# Patient Record
Sex: Female | Born: 1955
Health system: Southern US, Community
[De-identification: ages and names within clinical notes are randomized; demographics above are authoritative.]

## PROBLEM LIST (undated history)

## (undated) DIAGNOSIS — R252 Cramp and spasm: Secondary | ICD-10-CM

## (undated) DIAGNOSIS — K219 Gastro-esophageal reflux disease without esophagitis: Secondary | ICD-10-CM

## (undated) DIAGNOSIS — N83209 Unspecified ovarian cyst, unspecified side: Secondary | ICD-10-CM

## (undated) DIAGNOSIS — N189 Chronic kidney disease, unspecified: Secondary | ICD-10-CM

## (undated) DIAGNOSIS — J040 Acute laryngitis: Secondary | ICD-10-CM

## (undated) DIAGNOSIS — E039 Hypothyroidism, unspecified: Secondary | ICD-10-CM

## (undated) DIAGNOSIS — E1143 Type 2 diabetes mellitus with diabetic autonomic (poly)neuropathy: Secondary | ICD-10-CM

## (undated) DIAGNOSIS — R32 Unspecified urinary incontinence: Secondary | ICD-10-CM

## (undated) DIAGNOSIS — E782 Mixed hyperlipidemia: Secondary | ICD-10-CM

## (undated) DIAGNOSIS — L719 Rosacea, unspecified: Secondary | ICD-10-CM

## (undated) DIAGNOSIS — I10 Essential (primary) hypertension: Secondary | ICD-10-CM

## (undated) DIAGNOSIS — D131 Benign neoplasm of stomach: Secondary | ICD-10-CM

## (undated) DIAGNOSIS — R740 Nonspecific elevation of levels of transaminase and lactic acid dehydrogenase [LDH]: Secondary | ICD-10-CM

## (undated) DIAGNOSIS — T7840XA Allergy, unspecified, initial encounter: Secondary | ICD-10-CM

## (undated) DIAGNOSIS — I4891 Unspecified atrial fibrillation: Secondary | ICD-10-CM

## (undated) DIAGNOSIS — G894 Chronic pain syndrome: Secondary | ICD-10-CM

## (undated) DIAGNOSIS — H269 Unspecified cataract: Secondary | ICD-10-CM

## (undated) DIAGNOSIS — J45909 Unspecified asthma, uncomplicated: Secondary | ICD-10-CM

## (undated) DIAGNOSIS — M1990 Unspecified osteoarthritis, unspecified site: Secondary | ICD-10-CM

## (undated) DIAGNOSIS — D509 Iron deficiency anemia, unspecified: Secondary | ICD-10-CM

## (undated) DIAGNOSIS — M722 Plantar fascial fibromatosis: Secondary | ICD-10-CM

## (undated) DIAGNOSIS — K3184 Gastroparesis: Secondary | ICD-10-CM

## (undated) DIAGNOSIS — J189 Pneumonia, unspecified organism: Secondary | ICD-10-CM

## (undated) DIAGNOSIS — I499 Cardiac arrhythmia, unspecified: Secondary | ICD-10-CM

## (undated) DIAGNOSIS — E119 Type 2 diabetes mellitus without complications: Secondary | ICD-10-CM

## (undated) DIAGNOSIS — G5600 Carpal tunnel syndrome, unspecified upper limb: Secondary | ICD-10-CM

## (undated) DIAGNOSIS — K909 Intestinal malabsorption, unspecified: Secondary | ICD-10-CM

## (undated) DIAGNOSIS — K7689 Other specified diseases of liver: Secondary | ICD-10-CM

## (undated) DIAGNOSIS — R110 Nausea: Secondary | ICD-10-CM

## (undated) DIAGNOSIS — E876 Hypokalemia: Secondary | ICD-10-CM

## (undated) HISTORY — DX: Unspecified urinary incontinence: R32

## (undated) HISTORY — DX: Type 2 diabetes mellitus with diabetic autonomic (poly)neuropathy: E11.43

## (undated) HISTORY — DX: Acute laryngitis: J04.0

## (undated) HISTORY — DX: Intestinal malabsorption, unspecified: K90.9

## (undated) HISTORY — DX: Hypokalemia: E87.6

## (undated) HISTORY — DX: Chronic pain syndrome: G89.4

## (undated) HISTORY — DX: Chronic kidney disease, unspecified: N18.9

## (undated) HISTORY — DX: Nausea: R11.0

## (undated) HISTORY — PX: COLONOSCOPY: SHX174

## (undated) HISTORY — PX: CATARACT EXTRACTION W/ INTRAOCULAR LENS IMPLANT: SHX1309

## (undated) HISTORY — DX: Hypomagnesemia: E83.42

## (undated) HISTORY — DX: Benign neoplasm of stomach: D13.1

## (undated) HISTORY — DX: Other specified diseases of liver: K76.89

## (undated) HISTORY — DX: Hypothyroidism, unspecified: E03.9

## (undated) HISTORY — DX: Unspecified ovarian cyst, unspecified side: N83.209

## (undated) HISTORY — DX: Mixed hyperlipidemia: E78.2

## (undated) HISTORY — DX: Unspecified atrial fibrillation: I48.91

## (undated) HISTORY — DX: Carpal tunnel syndrome, unspecified upper limb: G56.00

## (undated) HISTORY — DX: Nonspecific elevation of levels of transaminase and lactic acid dehydrogenase (ldh): R74.0

## (undated) HISTORY — DX: Type 2 diabetes mellitus without complications: E11.9

## (undated) HISTORY — PX: ESOPHAGOGASTRODUODENOSCOPY: SHX1529

## (undated) HISTORY — DX: Essential (primary) hypertension: I10

## (undated) HISTORY — DX: Gastro-esophageal reflux disease without esophagitis: K21.9

## (undated) HISTORY — DX: Unspecified cataract: H26.9

## (undated) HISTORY — DX: Allergy, unspecified, initial encounter: T78.40XA

## (undated) HISTORY — DX: Gastroparesis: K31.84

## (undated) HISTORY — DX: Unspecified osteoarthritis, unspecified site: M19.90

## (undated) HISTORY — DX: Iron deficiency anemia, unspecified: D50.9

## (undated) HISTORY — DX: Rosacea, unspecified: L71.9

## (undated) HISTORY — DX: Cramp and spasm: R25.2

## (undated) HISTORY — PX: KNEE ARTHROSCOPY: SUR90

## (undated) HISTORY — DX: Plantar fascial fibromatosis: M72.2

---
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
## 1979-05-28 HISTORY — PX: BLADDER SUSPENSION: SHX72

## 1981-05-27 HISTORY — PX: VAGINAL HYSTERECTOMY: SUR661

## 2000-05-27 HISTORY — PX: AUGMENTATION MAMMAPLASTY: SUR837

## 2003-05-27 ENCOUNTER — Ambulatory Visit (HOSPITAL_COMMUNITY): Admission: RE | Admit: 2003-05-27 | Discharge: 2003-05-27 | Payer: Self-pay | Admitting: *Deleted

## 2005-05-27 HISTORY — PX: LAPAROSCOPIC CHOLECYSTECTOMY: SUR755

## 2006-04-21 ENCOUNTER — Encounter: Admission: RE | Admit: 2006-04-21 | Discharge: 2006-07-20 | Payer: Self-pay | Admitting: Family Medicine

## 2006-04-22 ENCOUNTER — Emergency Department (HOSPITAL_COMMUNITY): Admission: EM | Admit: 2006-04-22 | Discharge: 2006-04-22 | Payer: Self-pay | Admitting: Emergency Medicine

## 2006-05-02 ENCOUNTER — Encounter (INDEPENDENT_AMBULATORY_CARE_PROVIDER_SITE_OTHER): Payer: Self-pay | Admitting: *Deleted

## 2006-05-02 ENCOUNTER — Ambulatory Visit (HOSPITAL_COMMUNITY): Admission: RE | Admit: 2006-05-02 | Discharge: 2006-05-02 | Payer: Self-pay | Admitting: *Deleted

## 2006-08-29 ENCOUNTER — Ambulatory Visit: Payer: Self-pay | Admitting: Internal Medicine

## 2006-10-08 ENCOUNTER — Ambulatory Visit: Payer: Self-pay | Admitting: Internal Medicine

## 2006-10-08 LAB — CONVERTED CEMR LAB
ALT: 72 units/L — ABNORMAL HIGH (ref 0–40)
AST: 43 units/L — ABNORMAL HIGH (ref 0–37)
Albumin: 4 g/dL (ref 3.5–5.2)
Alkaline Phosphatase: 84 units/L (ref 39–117)
Bilirubin, Direct: 0.1 mg/dL (ref 0.0–0.3)
Cholesterol: 188 mg/dL (ref 0–200)
Ferritin: 175.9 ng/mL (ref 10.0–291.0)
HDL: 37 mg/dL — ABNORMAL LOW (ref >39.0)
LDL Cholesterol: 112 mg/dL — ABNORMAL HIGH (ref 0–99)
Rheumatoid fact SerPl-aCnc: <20 intl units/mL — ABNORMAL LOW (ref 0.0–20.0)
Total Bilirubin: 0.9 mg/dL (ref 0.3–1.2)
Total CHOL/HDL Ratio: 5.1
Total CK: 53 units/L (ref 7–177)
Total Protein: 6.9 g/dL (ref 6.0–8.3)
Triglycerides: 193 mg/dL — ABNORMAL HIGH (ref 0–149)
VLDL: 39 mg/dL (ref 0–40)

## 2006-10-14 ENCOUNTER — Ambulatory Visit: Payer: Self-pay | Admitting: Internal Medicine

## 2006-10-28 ENCOUNTER — Ambulatory Visit: Payer: Self-pay | Admitting: Internal Medicine

## 2006-11-13 ENCOUNTER — Ambulatory Visit: Payer: Self-pay | Admitting: Internal Medicine

## 2006-11-13 LAB — CONVERTED CEMR LAB
ALT: 92 units/L — ABNORMAL HIGH (ref 0–40)
AST: 60 units/L — ABNORMAL HIGH (ref 0–37)
Albumin ELP: 61.3 % (ref 55.8–66.1)
Alpha-1-Globulin: 3.8 % (ref 2.9–4.9)
Alpha-2-Globulin: 9.1 % (ref 7.1–11.8)
Anti Nuclear Antibody(ANA): NEGATIVE
BUN: 10 mg/dL (ref 6–23)
Beta Globulin: 7.6 % — ABNORMAL HIGH (ref 4.7–7.2)
CO2: 30 meq/L (ref 19–32)
Calcium: 9.6 mg/dL (ref 8.4–10.5)
Ceruloplasmin: 37 mg/dL (ref 21–63)
Chloride: 107 meq/L (ref 96–112)
Cholesterol: 212 mg/dL (ref 0–200)
Creatinine, Ser: 0.6 mg/dL (ref 0.4–1.2)
Creatinine,U: 139 mg/dL
Direct LDL: 136.9 mg/dL
GFR calc Af Amer: 135 mL/min
GFR calc non Af Amer: 112 mL/min
Gamma Globulin: 12.5 % (ref 11.1–18.8)
Glucose, Bld: 138 mg/dL — ABNORMAL HIGH (ref 70–99)
HCV Ab: NEGATIVE
HDL: 43 mg/dL (ref >39.0)
Hep B S Ab: NEGATIVE
Hgb A1c MFr Bld: 6.4 % — ABNORMAL HIGH (ref 4.6–6.0)
Microalb Creat Ratio: 4.3 mg/g (ref 0.0–30.0)
Microalb, Ur: 0.6 mg/dL (ref 0.0–1.9)
Potassium: 4.6 meq/L (ref 3.5–5.1)
Sodium: 141 meq/L (ref 135–145)
TSH: 0.52 microintl units/mL (ref 0.35–5.50)
Total CHOL/HDL Ratio: 4.9
Total Protein, Serum Electrophoresis: 7.5 g/dL (ref 6.0–8.3)
Triglycerides: 212 mg/dL (ref 0–149)
VLDL: 42 mg/dL — ABNORMAL HIGH (ref 0–40)

## 2006-11-19 ENCOUNTER — Ambulatory Visit: Payer: Self-pay | Admitting: Internal Medicine

## 2006-11-26 ENCOUNTER — Ambulatory Visit (HOSPITAL_COMMUNITY): Admission: RE | Admit: 2006-11-26 | Discharge: 2006-11-26 | Payer: Self-pay | Admitting: Internal Medicine

## 2006-12-03 ENCOUNTER — Encounter: Admission: RE | Admit: 2006-12-03 | Discharge: 2006-12-03 | Payer: Self-pay | Admitting: Internal Medicine

## 2006-12-25 ENCOUNTER — Ambulatory Visit: Payer: Self-pay | Admitting: Internal Medicine

## 2006-12-25 LAB — CONVERTED CEMR LAB
ALT: 64 units/L — ABNORMAL HIGH (ref 0–35)
AST: 40 units/L — ABNORMAL HIGH (ref 0–37)
Albumin: 3.9 g/dL (ref 3.5–5.2)
Alkaline Phosphatase: 84 units/L (ref 39–117)
Bilirubin, Direct: 0.1 mg/dL (ref 0.0–0.3)
Cholesterol: 205 mg/dL (ref 0–200)
Direct LDL: 135.4 mg/dL
HDL: 37.7 mg/dL — ABNORMAL LOW (ref >39.0)
Total Bilirubin: 1 mg/dL (ref 0.3–1.2)
Total CHOL/HDL Ratio: 5.4
Total CK: 41 units/L (ref 7–177)
Total Protein: 6.8 g/dL (ref 6.0–8.3)
Triglycerides: 227 mg/dL (ref 0–149)
VLDL: 45 mg/dL — ABNORMAL HIGH (ref 0–40)

## 2006-12-31 ENCOUNTER — Ambulatory Visit: Payer: Self-pay | Admitting: Internal Medicine

## 2007-04-27 ENCOUNTER — Ambulatory Visit: Payer: Self-pay | Admitting: Internal Medicine

## 2007-04-27 ENCOUNTER — Telehealth: Payer: Self-pay | Admitting: Internal Medicine

## 2007-04-27 DIAGNOSIS — Z862 Personal history of diseases of the blood and blood-forming organs and certain disorders involving the immune mechanism: Secondary | ICD-10-CM

## 2007-04-27 DIAGNOSIS — Z8639 Personal history of other endocrine, nutritional and metabolic disease: Secondary | ICD-10-CM

## 2007-04-27 DIAGNOSIS — K7689 Other specified diseases of liver: Secondary | ICD-10-CM | POA: Insufficient documentation

## 2007-04-27 DIAGNOSIS — E039 Hypothyroidism, unspecified: Secondary | ICD-10-CM

## 2007-04-27 HISTORY — DX: Other specified diseases of liver: K76.89

## 2007-04-29 ENCOUNTER — Encounter: Payer: Self-pay | Admitting: Internal Medicine

## 2007-05-01 ENCOUNTER — Ambulatory Visit: Payer: Self-pay | Admitting: Internal Medicine

## 2007-05-01 DIAGNOSIS — R740 Nonspecific elevation of levels of transaminase and lactic acid dehydrogenase [LDH]: Secondary | ICD-10-CM

## 2007-05-01 DIAGNOSIS — R7401 Elevation of levels of liver transaminase levels: Secondary | ICD-10-CM

## 2007-05-01 HISTORY — DX: Elevation of levels of liver transaminase levels: R74.01

## 2007-05-01 LAB — CONVERTED CEMR LAB
ALT: 38 units/L — ABNORMAL HIGH (ref 0–35)
ALT: 38 units/L — ABNORMAL HIGH (ref 0–35)
AST: 25 units/L (ref 0–37)
AST: 25 units/L (ref 0–37)
Albumin: 3.9 g/dL (ref 3.5–5.2)
Albumin: 3.9 g/dL (ref 3.5–5.2)
Alkaline Phosphatase: 61 units/L (ref 39–117)
Alkaline Phosphatase: 61 units/L (ref 39–117)
Bilirubin, Direct: 0.2 mg/dL (ref 0.0–0.3)
Bilirubin, Direct: 0.2 mg/dL (ref 0.0–0.3)
Cholesterol: 250 mg/dL (ref 0–200)
Cholesterol: 250 mg/dL (ref 0–200)
Direct LDL: 177.8 mg/dL
Direct LDL: 177.8 mg/dL
HDL: 49.8 mg/dL (ref >39.0)
HDL: 49.8 mg/dL (ref >39.0)
Hgb A1c MFr Bld: 5.9 % (ref 4.6–6.0)
Total Bilirubin: 1 mg/dL (ref 0.3–1.2)
Total Bilirubin: 1 mg/dL (ref 0.3–1.2)
Total CHOL/HDL Ratio: 5
Total CHOL/HDL Ratio: 5
Total Protein: 6.9 g/dL (ref 6.0–8.3)
Total Protein: 6.9 g/dL (ref 6.0–8.3)
Triglycerides: 159 mg/dL — ABNORMAL HIGH (ref 0–149)
Triglycerides: 159 mg/dL — ABNORMAL HIGH (ref 0–149)
VLDL: 32 mg/dL (ref 0–40)
VLDL: 32 mg/dL (ref 0–40)

## 2007-05-04 ENCOUNTER — Ambulatory Visit: Payer: Self-pay | Admitting: Internal Medicine

## 2007-05-04 DIAGNOSIS — E785 Hyperlipidemia, unspecified: Secondary | ICD-10-CM | POA: Insufficient documentation

## 2007-05-04 DIAGNOSIS — E782 Mixed hyperlipidemia: Secondary | ICD-10-CM

## 2007-05-04 HISTORY — DX: Mixed hyperlipidemia: E78.2

## 2007-05-05 ENCOUNTER — Encounter: Payer: Self-pay | Admitting: Internal Medicine

## 2007-05-05 ENCOUNTER — Ambulatory Visit: Payer: Self-pay

## 2007-05-19 ENCOUNTER — Telehealth: Payer: Self-pay | Admitting: Internal Medicine

## 2007-05-20 ENCOUNTER — Encounter (INDEPENDENT_AMBULATORY_CARE_PROVIDER_SITE_OTHER): Payer: Self-pay | Admitting: *Deleted

## 2007-06-02 ENCOUNTER — Ambulatory Visit: Payer: Self-pay | Admitting: Gastroenterology

## 2007-06-05 ENCOUNTER — Encounter: Payer: Self-pay | Admitting: Gastroenterology

## 2007-06-05 ENCOUNTER — Encounter: Payer: Self-pay | Admitting: Internal Medicine

## 2007-06-05 ENCOUNTER — Ambulatory Visit: Payer: Self-pay | Admitting: Gastroenterology

## 2007-06-05 ENCOUNTER — Encounter: Payer: Self-pay | Admitting: Endocrinology

## 2007-06-16 ENCOUNTER — Telehealth (INDEPENDENT_AMBULATORY_CARE_PROVIDER_SITE_OTHER): Payer: Self-pay | Admitting: *Deleted

## 2007-07-14 ENCOUNTER — Telehealth: Payer: Self-pay | Admitting: Internal Medicine

## 2007-07-23 ENCOUNTER — Ambulatory Visit: Payer: Self-pay | Admitting: Gastroenterology

## 2007-07-23 ENCOUNTER — Ambulatory Visit (HOSPITAL_COMMUNITY): Admission: RE | Admit: 2007-07-23 | Discharge: 2007-07-23 | Payer: Self-pay | Admitting: Gastroenterology

## 2007-10-05 ENCOUNTER — Telehealth: Payer: Self-pay | Admitting: Gastroenterology

## 2007-10-12 ENCOUNTER — Ambulatory Visit: Payer: Self-pay | Admitting: Internal Medicine

## 2007-10-30 ENCOUNTER — Ambulatory Visit: Payer: Self-pay | Admitting: Internal Medicine

## 2007-10-30 LAB — CONVERTED CEMR LAB
BUN: 11 mg/dL (ref 6–23)
CO2: 29 meq/L (ref 19–32)
Calcium: 9.4 mg/dL (ref 8.4–10.5)
Chloride: 101 meq/L (ref 96–112)
Creatinine, Ser: 0.6 mg/dL (ref 0.4–1.2)
GFR calc Af Amer: 134 mL/min
GFR calc non Af Amer: 111 mL/min
Glucose, Bld: 129 mg/dL — ABNORMAL HIGH (ref 70–99)
Hgb A1c MFr Bld: 6 % (ref 4.6–6.0)
Potassium: 3.9 meq/L (ref 3.5–5.1)
Sodium: 141 meq/L (ref 135–145)
TSH: 0.93 microintl units/mL (ref 0.35–5.50)

## 2007-11-04 ENCOUNTER — Ambulatory Visit: Payer: Self-pay | Admitting: Internal Medicine

## 2007-11-04 DIAGNOSIS — R030 Elevated blood-pressure reading, without diagnosis of hypertension: Secondary | ICD-10-CM

## 2007-11-04 LAB — CONVERTED CEMR LAB
CRP, High Sensitivity: 4 (ref 0.00–5.00)
Vit D, 1,25-Dihydroxy: 32 (ref 30–89)

## 2007-11-05 ENCOUNTER — Telehealth: Payer: Self-pay | Admitting: Internal Medicine

## 2007-11-05 ENCOUNTER — Ambulatory Visit: Payer: Self-pay | Admitting: Family Medicine

## 2007-11-05 ENCOUNTER — Encounter: Payer: Self-pay | Admitting: Internal Medicine

## 2007-11-17 ENCOUNTER — Telehealth: Payer: Self-pay | Admitting: Internal Medicine

## 2007-11-30 ENCOUNTER — Ambulatory Visit: Payer: Self-pay | Admitting: Internal Medicine

## 2007-11-30 LAB — CONVERTED CEMR LAB: Rapid Strep: NEGATIVE

## 2008-01-06 ENCOUNTER — Encounter: Admission: RE | Admit: 2008-01-06 | Discharge: 2008-01-06 | Payer: Self-pay | Admitting: Internal Medicine

## 2008-03-11 ENCOUNTER — Ambulatory Visit: Payer: Self-pay | Admitting: Internal Medicine

## 2008-03-11 LAB — CONVERTED CEMR LAB
ALT: 68 units/L — ABNORMAL HIGH (ref 0–35)
AST: 53 units/L — ABNORMAL HIGH (ref 0–37)
BUN: 13 mg/dL (ref 6–23)
CO2: 30 meq/L (ref 19–32)
CRP, High Sensitivity: 4 (ref 0.00–5.00)
Calcium: 9.3 mg/dL (ref 8.4–10.5)
Chloride: 104 meq/L (ref 96–112)
Cholesterol: 181 mg/dL (ref 0–200)
Creatinine, Ser: 0.6 mg/dL (ref 0.4–1.2)
Creatinine,U: 149.7 mg/dL
GFR calc Af Amer: 134 mL/min
GFR calc non Af Amer: 111 mL/min
Glucose, Bld: 134 mg/dL — ABNORMAL HIGH (ref 70–99)
HDL: 44.9 mg/dL (ref >39.0)
Hgb A1c MFr Bld: 6.3 % — ABNORMAL HIGH (ref 4.6–6.0)
LDL Cholesterol: 109 mg/dL — ABNORMAL HIGH (ref 0–99)
Microalb Creat Ratio: 6 mg/g (ref 0.0–30.0)
Microalb, Ur: 0.9 mg/dL (ref 0.0–1.9)
Potassium: 4.2 meq/L (ref 3.5–5.1)
Sodium: 142 meq/L (ref 135–145)
Total CHOL/HDL Ratio: 4
Total CK: 54 units/L (ref 7–177)
Triglycerides: 135 mg/dL (ref 0–149)
VLDL: 27 mg/dL (ref 0–40)

## 2008-03-17 ENCOUNTER — Ambulatory Visit: Payer: Self-pay | Admitting: Internal Medicine

## 2008-03-17 ENCOUNTER — Emergency Department (HOSPITAL_BASED_OUTPATIENT_CLINIC_OR_DEPARTMENT_OTHER): Admission: EM | Admit: 2008-03-17 | Discharge: 2008-03-17 | Payer: Self-pay | Admitting: Emergency Medicine

## 2008-03-17 LAB — CONVERTED CEMR LAB
Inflenza A Ag: NEGATIVE
Influenza B Ag: NEGATIVE

## 2008-03-20 ENCOUNTER — Emergency Department (HOSPITAL_BASED_OUTPATIENT_CLINIC_OR_DEPARTMENT_OTHER): Admission: EM | Admit: 2008-03-20 | Discharge: 2008-03-20 | Payer: Self-pay | Admitting: Emergency Medicine

## 2008-03-28 ENCOUNTER — Ambulatory Visit: Payer: Self-pay | Admitting: Internal Medicine

## 2008-04-01 ENCOUNTER — Ambulatory Visit (HOSPITAL_BASED_OUTPATIENT_CLINIC_OR_DEPARTMENT_OTHER): Admission: RE | Admit: 2008-04-01 | Discharge: 2008-04-01 | Payer: Self-pay | Admitting: Internal Medicine

## 2008-04-01 ENCOUNTER — Ambulatory Visit: Payer: Self-pay | Admitting: Internal Medicine

## 2008-06-22 ENCOUNTER — Ambulatory Visit: Payer: Self-pay | Admitting: Internal Medicine

## 2008-06-22 LAB — CONVERTED CEMR LAB
ALT: 43 units/L — ABNORMAL HIGH (ref 0–35)
AST: 31 units/L (ref 0–37)
BUN: 11 mg/dL (ref 6–23)
CO2: 30 meq/L (ref 19–32)
Calcium: 8.9 mg/dL (ref 8.4–10.5)
Chloride: 105 meq/L (ref 96–112)
Creatinine, Ser: 0.5 mg/dL (ref 0.4–1.2)
GFR calc Af Amer: 166 mL/min
GFR calc non Af Amer: 137 mL/min
Glucose, Bld: 133 mg/dL — ABNORMAL HIGH (ref 70–99)
Hgb A1c MFr Bld: 6.3 % — ABNORMAL HIGH (ref 4.6–6.0)
Potassium: 3.6 meq/L (ref 3.5–5.1)
Sodium: 141 meq/L (ref 135–145)

## 2008-06-27 ENCOUNTER — Ambulatory Visit: Payer: Self-pay | Admitting: Internal Medicine

## 2008-06-29 ENCOUNTER — Ambulatory Visit: Payer: Self-pay | Admitting: Internal Medicine

## 2008-06-29 LAB — CONVERTED CEMR LAB: TSH: 0.31 microintl units/mL — ABNORMAL LOW (ref 0.35–5.50)

## 2008-06-30 ENCOUNTER — Telehealth: Payer: Self-pay | Admitting: Internal Medicine

## 2008-07-06 ENCOUNTER — Encounter: Payer: Self-pay | Admitting: Internal Medicine

## 2008-08-18 ENCOUNTER — Encounter: Payer: Self-pay | Admitting: Internal Medicine

## 2008-08-22 ENCOUNTER — Ambulatory Visit: Payer: Self-pay | Admitting: Internal Medicine

## 2008-08-22 LAB — CONVERTED CEMR LAB: TSH: 0.36 microintl units/mL (ref 0.35–5.50)

## 2008-08-23 ENCOUNTER — Encounter: Payer: Self-pay | Admitting: Internal Medicine

## 2008-08-29 ENCOUNTER — Ambulatory Visit: Payer: Self-pay | Admitting: Internal Medicine

## 2008-10-26 ENCOUNTER — Encounter: Payer: Self-pay | Admitting: Internal Medicine

## 2008-12-22 ENCOUNTER — Ambulatory Visit: Payer: Self-pay | Admitting: Internal Medicine

## 2008-12-22 LAB — CONVERTED CEMR LAB
ALT: 31 units/L (ref 0–35)
AST: 23 units/L (ref 0–37)
Albumin: 4 g/dL (ref 3.5–5.2)
Alkaline Phosphatase: 66 units/L (ref 39–117)
BUN: 15 mg/dL (ref 6–23)
Bilirubin, Direct: 0.1 mg/dL (ref 0.0–0.3)
CO2: 29 meq/L (ref 19–32)
Calcium: 9.2 mg/dL (ref 8.4–10.5)
Chloride: 106 meq/L (ref 96–112)
Cholesterol: 184 mg/dL (ref 0–200)
Creatinine, Ser: 0.6 mg/dL (ref 0.4–1.2)
Creatinine,U: 167.3 mg/dL
Direct LDL: 110.1 mg/dL
GFR calc non Af Amer: 110.63 mL/min (ref >60)
Glucose, Bld: 132 mg/dL — ABNORMAL HIGH (ref 70–99)
HDL: 41.8 mg/dL (ref >39.00)
Hgb A1c MFr Bld: 5.8 % (ref 4.6–6.5)
Microalb Creat Ratio: 6.6 mg/g (ref 0.0–30.0)
Microalb, Ur: 1.1 mg/dL (ref 0.0–1.9)
Potassium: 4.1 meq/L (ref 3.5–5.1)
Sodium: 145 meq/L (ref 135–145)
TSH: 0.35 microintl units/mL (ref 0.35–5.50)
Total Bilirubin: 0.9 mg/dL (ref 0.3–1.2)
Total CHOL/HDL Ratio: 4
Total Protein: 7 g/dL (ref 6.0–8.3)
Triglycerides: 273 mg/dL — ABNORMAL HIGH (ref 0.0–149.0)
VLDL: 54.6 mg/dL — ABNORMAL HIGH (ref 0.0–40.0)

## 2008-12-29 ENCOUNTER — Ambulatory Visit: Payer: Self-pay | Admitting: Internal Medicine

## 2009-03-01 ENCOUNTER — Ambulatory Visit: Payer: Self-pay | Admitting: Internal Medicine

## 2009-03-01 DIAGNOSIS — M549 Dorsalgia, unspecified: Secondary | ICD-10-CM | POA: Insufficient documentation

## 2009-03-01 LAB — CONVERTED CEMR LAB
Bilirubin Urine: NEGATIVE
Glucose, Urine, Semiquant: NEGATIVE
Nitrite: NEGATIVE
Protein, U semiquant: 100
Specific Gravity, Urine: 1.015
Urobilinogen, UA: 0.2
pH: 6

## 2009-03-06 ENCOUNTER — Telehealth: Payer: Self-pay | Admitting: Internal Medicine

## 2009-03-08 ENCOUNTER — Ambulatory Visit: Payer: Self-pay | Admitting: Radiology

## 2009-03-08 ENCOUNTER — Telehealth: Payer: Self-pay | Admitting: Internal Medicine

## 2009-03-08 ENCOUNTER — Ambulatory Visit: Payer: Self-pay | Admitting: Internal Medicine

## 2009-03-08 ENCOUNTER — Ambulatory Visit (HOSPITAL_BASED_OUTPATIENT_CLINIC_OR_DEPARTMENT_OTHER): Admission: RE | Admit: 2009-03-08 | Discharge: 2009-03-08 | Payer: Self-pay | Admitting: Internal Medicine

## 2009-03-08 LAB — CONVERTED CEMR LAB
Bilirubin Urine: NEGATIVE
Bilirubin Urine: NEGATIVE
Glucose, Urine, Semiquant: NEGATIVE
Hemoglobin, Urine: NEGATIVE
Ketones, ur: NEGATIVE mg/dL
Ketones, urine, test strip: NEGATIVE
Leukocytes, UA: NEGATIVE
Nitrite: NEGATIVE
Nitrite: NEGATIVE
Protein, ur: NEGATIVE mg/dL
Specific Gravity, Urine: 1.015
Specific Gravity, Urine: 1.011 (ref 1.005–1.030)
Urine Glucose: NEGATIVE mg/dL
Urobilinogen, UA: 0.2
Urobilinogen, UA: 0.2 (ref 0.0–1.0)
WBC Urine, dipstick: NEGATIVE
pH: 6
pH: 7 (ref 5.0–8.0)

## 2009-07-06 ENCOUNTER — Ambulatory Visit: Payer: Self-pay | Admitting: Internal Medicine

## 2009-07-06 LAB — CONVERTED CEMR LAB
BUN: 15 mg/dL (ref 6–23)
CO2: 26 meq/L (ref 19–32)
Calcium: 9.1 mg/dL (ref 8.4–10.5)
Chloride: 103 meq/L (ref 96–112)
Cholesterol: 193 mg/dL (ref 0–200)
Creatinine, Ser: 0.62 mg/dL (ref 0.40–1.20)
Glucose, Bld: 120 mg/dL — ABNORMAL HIGH (ref 70–99)
HDL: 52 mg/dL (ref >39)
Hgb A1c MFr Bld: 5.6 % (ref 4.6–6.1)
LDL Cholesterol: 103 mg/dL — ABNORMAL HIGH (ref 0–99)
Potassium: 4.4 meq/L (ref 3.5–5.3)
Sodium: 140 meq/L (ref 135–145)
TSH: 0.463 microintl units/mL (ref 0.350–4.500)
Total CHOL/HDL Ratio: 3.7
Triglycerides: 190 mg/dL — ABNORMAL HIGH (ref <150)
VLDL: 38 mg/dL (ref 0–40)

## 2009-07-11 ENCOUNTER — Ambulatory Visit: Payer: Self-pay | Admitting: Internal Medicine

## 2009-07-11 DIAGNOSIS — N83209 Unspecified ovarian cyst, unspecified side: Secondary | ICD-10-CM

## 2009-07-11 HISTORY — DX: Unspecified ovarian cyst, unspecified side: N83.209

## 2009-07-11 LAB — HM DIABETES FOOT EXAM: HM Diabetic Foot Exam: NORMAL

## 2009-07-11 LAB — CONVERTED CEMR LAB: CA 125: 8.3 units/mL (ref 0.0–30.2)

## 2009-07-13 ENCOUNTER — Ambulatory Visit (HOSPITAL_BASED_OUTPATIENT_CLINIC_OR_DEPARTMENT_OTHER): Admission: RE | Admit: 2009-07-13 | Discharge: 2009-07-13 | Payer: Self-pay | Admitting: Internal Medicine

## 2009-07-13 ENCOUNTER — Ambulatory Visit: Payer: Self-pay | Admitting: Diagnostic Radiology

## 2009-07-17 ENCOUNTER — Telehealth: Payer: Self-pay | Admitting: Internal Medicine

## 2009-08-08 ENCOUNTER — Ambulatory Visit: Payer: Self-pay | Admitting: Diagnostic Radiology

## 2009-08-08 ENCOUNTER — Ambulatory Visit (HOSPITAL_BASED_OUTPATIENT_CLINIC_OR_DEPARTMENT_OTHER): Admission: RE | Admit: 2009-08-08 | Discharge: 2009-08-08 | Payer: Self-pay | Admitting: Internal Medicine

## 2009-08-08 ENCOUNTER — Ambulatory Visit: Payer: Self-pay | Admitting: Internal Medicine

## 2009-08-08 ENCOUNTER — Telehealth: Payer: Self-pay | Admitting: Internal Medicine

## 2009-08-08 LAB — CONVERTED CEMR LAB
Bilirubin Urine: NEGATIVE
Blood in Urine, dipstick: NEGATIVE
Glucose, Urine, Semiquant: NEGATIVE
Protein, U semiquant: 30
Specific Gravity, Urine: >=1.03
pH: 5

## 2009-09-12 ENCOUNTER — Encounter: Payer: Self-pay | Admitting: Internal Medicine

## 2009-09-12 LAB — CONVERTED CEMR LAB
CO2: 27 meq/L (ref 19–32)
Calcium: 10.1 mg/dL (ref 8.4–10.5)
Chloride: 98 meq/L (ref 96–112)
Sodium: 139 meq/L (ref 135–145)

## 2009-09-21 ENCOUNTER — Ambulatory Visit: Payer: Self-pay | Admitting: Internal Medicine

## 2009-10-25 ENCOUNTER — Telehealth: Payer: Self-pay | Admitting: Internal Medicine

## 2009-10-31 ENCOUNTER — Encounter: Payer: Self-pay | Admitting: Internal Medicine

## 2009-10-31 LAB — CONVERTED CEMR LAB
ALT: 29 units/L (ref 0–35)
AST: 24 units/L (ref 0–37)
Alkaline Phosphatase: 60 units/L (ref 39–117)
CO2: 26 meq/L (ref 19–32)
Chloride: 96 meq/L (ref 96–112)
Creatinine, Ser: 0.74 mg/dL (ref 0.40–1.20)
Indirect Bilirubin: 0.6 mg/dL (ref 0.0–0.9)
Potassium: 4.6 meq/L (ref 3.5–5.3)
Total Protein: 7.4 g/dL (ref 6.0–8.3)

## 2009-11-07 ENCOUNTER — Encounter: Admission: RE | Admit: 2009-11-07 | Discharge: 2009-11-07 | Payer: Self-pay | Admitting: Internal Medicine

## 2009-11-08 ENCOUNTER — Ambulatory Visit: Payer: Self-pay | Admitting: Internal Medicine

## 2009-11-14 LAB — HM DIABETES EYE EXAM: HM Diabetic Eye Exam: NORMAL

## 2009-11-20 ENCOUNTER — Encounter: Payer: Self-pay | Admitting: Internal Medicine

## 2009-12-06 ENCOUNTER — Encounter: Payer: Self-pay | Admitting: Internal Medicine

## 2009-12-15 ENCOUNTER — Ambulatory Visit: Payer: Self-pay | Admitting: Internal Medicine

## 2010-02-06 ENCOUNTER — Ambulatory Visit: Payer: Self-pay | Admitting: Internal Medicine

## 2010-04-11 ENCOUNTER — Telehealth: Payer: Self-pay | Admitting: Internal Medicine

## 2010-04-11 LAB — CONVERTED CEMR LAB
ALT: 35 units/L (ref 0–35)
AST: 32 units/L (ref 0–37)
Alkaline Phosphatase: 68 units/L (ref 39–117)
BUN: 14 mg/dL (ref 6–23)
Bilirubin, Direct: 0.1 mg/dL (ref 0.0–0.3)
CO2: 28 meq/L (ref 19–32)
Calcium: 9.3 mg/dL (ref 8.4–10.5)
Cholesterol: 235 mg/dL — ABNORMAL HIGH (ref 0–200)
Creatinine, Ser: 0.69 mg/dL (ref 0.40–1.20)
Glucose, Bld: 153 mg/dL — ABNORMAL HIGH (ref 70–99)
Indirect Bilirubin: 0.6 mg/dL (ref 0.0–0.9)
Sodium: 139 meq/L (ref 135–145)
Total Bilirubin: 0.7 mg/dL (ref 0.3–1.2)

## 2010-04-13 ENCOUNTER — Ambulatory Visit: Payer: Self-pay | Admitting: Internal Medicine

## 2010-05-17 ENCOUNTER — Ambulatory Visit: Payer: Self-pay | Admitting: Internal Medicine

## 2010-06-17 ENCOUNTER — Encounter: Payer: Self-pay | Admitting: Internal Medicine

## 2010-06-18 ENCOUNTER — Encounter: Payer: Self-pay | Admitting: Internal Medicine

## 2010-06-28 NOTE — Assessment & Plan Note (Signed)
Summary: 6 mon f/u/hea   Vital Signs:  Patient profile:   55 year old female Weight:      167 pounds BMI:     27.05 O2 Sat:      99 % on Room air Temp:     97.5 degrees F oral Pulse rate:   87 / minute Pulse rhythm:   regular BP sitting:   124 / 74  (right arm) Cuff size:   large  Vitals Entered By: Glendell Docker CMA (July 11, 2009 8:07 AM)  O2 Flow:  Room air  Contraindications/Deferment of Procedures/Staging:    Test/Procedure: PAP Smear    Reason for deferment: hysterectomy   Primary Care Provider:  Dondra Spry DO  CC:  6 Month Follow up  and Type 2 diabetes mellitus follow-up.  History of Present Illness: Follow up disease management  Type 2 Diabetes Mellitus Follow-Up      This is a 55 year old woman who presents for Type 2 diabetes mellitus follow-up.  The patient denies polyuria, polydipsia, self managed hypoglycemia, and weight gain.  The patient denies the following symptoms: neuropathic pain.  Since the last visit the patient reports good dietary compliance and compliance with medications.  she reports byetta effects are waning.   she occ forgets to take evening dose.  right sided abd pain resolved.  CT of abd and pelvis reviewed.  Clinical Data:  Right flank pain for 10 days.  History of adjacent UTI.  Rule out pyelonephrosis.  History of surgery on gallbladder, uterus and bladder.   CT ABDOMEN AND PELVIS WITHOUT CONTRAST   Technique:  Multidetector CT imaging of the abdomen and pelvis was performed following the standard protocol without intravenous contrast.     IMPRESSION: No upper abdominal inflammatory process detected as discussed above.   IMPRESSION: Right pelvic cystic structure may be related to the ovary.  Pelvic sonogram can be obtained further delineation.   No pelvic inflammatory process or free fluid detected.     Allergies: 1)  ! Zocor 2)  ! Epinephrine  Past History:  Past Medical History: Hypothyroidism Type 2  diabetes   Abnormal transaminases - negative work up     fatty liver  Atypical chest pain     GERD   Chronic osteoarthritis - right knee Plantar fascitis   Past Surgical History: Cholecystectomy           Social History: Occupation:  information system Editor, commissioning. Married - 20 year old son Never Smoked  Alcohol use-no              Physical Exam  General:  alert, well-developed, and well-nourished.   Lungs:  normal respiratory effort and normal breath sounds.   Heart:  normal rate, regular rhythm, and no gallop.   Abdomen:  soft, non-tender, no masses, no hepatomegaly, and no splenomegaly.  no pelvic mass Extremities:  No lower extremity edema  Neurologic:  cranial nerves II-XII intact and gait normal.    Diabetes Management Exam:    Foot Exam (with socks and/or shoes not present):       Inspection:          Left foot: normal          Right foot: normal   Impression & Recommendations:  Problem # 1:  OVARIAN CYST (ICD-620.2) Incidental right ovarian cyst found on prev CT of Abd and Pelvis.  She is asymptomatic.  No fam hx of ovarian cancer.  obtain pelvic ultrasound.  Orders: Ultrasound (Ultrasound)  Problem # 2:  DIABETES MELLITUS, TYPE II (ICD-250.00) Assessment: Unchanged well controlled.  she feels like byetta effects wearing off.  trial of once daily victoza.  Her updated medication list for this problem includes:    Metformin Hcl 750 Mg Tb24 (Metformin hcl) .Marland Kitchen... 2 tablet by mouth at bedtime    Low-dose Aspirin 81 Mg Tabs (Aspirin) .Marland Kitchen... Take 1 tablet by mouth once a day    Byetta 5 Mcg Pen 5 Mcg/0.44ml Soln (Exenatide) ..... Inject 5 micrograms two times a day  Labs Reviewed: Creat: 0.62 (07/06/2009)     Last Eye Exam: normal (08/18/2008) Reviewed HgBA1c results: 5.6 (07/06/2009)  5.8 (12/22/2008)  Problem # 3:  DYSLIPIDEMIA (ICD-272.4) Assessment: Improved Triglycerides improved.  Maintain current medication regimen.  Her updated medication  list for this problem includes:    Crestor 10 Mg Tabs (Rosuvastatin calcium) .Marland Kitchen... Take 1 tablet by mouth once per week  Labs Reviewed: SGOT: 23 (12/22/2008)   SGPT: 31 (12/22/2008)   HDL:52 (07/06/2009), 41.80 (12/22/2008)  LDL:103 (07/06/2009), 109 (03/11/2008)  Chol:193 (07/06/2009), 184 (12/22/2008)  Trig:190 (07/06/2009), 273.0 (12/22/2008)  Problem # 4:  BACK PAIN (ICD-724.5) Assessment: Improved resolved.  The following medications were removed from the medication list:    Cyclobenzaprine Hcl 5 Mg Tabs (Cyclobenzaprine hcl) ..... One by mouth at bedtime as needed    Tramadol Hcl 50 Mg Tabs (Tramadol hcl) .Marland Kitchen... 1/2 to one tab by mouth two times a day prn Her updated medication list for this problem includes:    Low-dose Aspirin 81 Mg Tabs (Aspirin) .Marland Kitchen... Take 1 tablet by mouth once a day  Complete Medication List: 1)  Metformin Hcl 750 Mg Tb24 (Metformin hcl) .... 2 tablet by mouth at bedtime 2)  Synthroid 88 Mcg Tabs (Levothyroxine sodium) .... One by mouth once daily 3)  Allegra 180 Mg Tabs (Fexofenadine hcl) .... Take 1 tablet by mouth once a day 4)  Osteo Bi-flex Regular Strength 250-200 Mg Tabs (Glucosamine-chondroitin) .... Take 1 tablet by mouth once a day 5)  Vitamin C 500 Mg Chew (Ascorbic acid) .... Take 1 tablet by mouth once a day 6)  Low-dose Aspirin 81 Mg Tabs (Aspirin) .... Take 1 tablet by mouth once a day 7)  Dexilant 60 Mg Cpdr (Dexlansoprazole) .... Take one by mouth once daily...must make office visit before anymore refills 8)  Freestyle Lancets Misc (Lancets) .... Test once daily 9)  Freestyle Lite Strp (Glucose blood) .... Test once daily 10)  Crestor 10 Mg Tabs (Rosuvastatin calcium) .... Take 1 tablet by mouth once per week 11)  Byetta 5 Mcg Pen 5 Mcg/0.33ml Soln (Exenatide) .... Inject 5 micrograms two times a day 12)  Bd U/f Short Pen Needle 31g X 8 Mm Misc (Insulin pen needle) .... Use for insulin injection two times a day  Patient Instructions: 1)   Please schedule a follow-up appointment in 4 months. 2)  BMP prior to visit, ICD-9:  401.9 3)  Hepatic Panel prior to visit, ICD-9: 272.4 4)  HbgA1C prior to visit, ICD-9: 250.00 5)  Please return for lab work one (1) week before your next appointment.  Prescriptions: FREESTYLE LANCETS   MISC (LANCETS) test once daily  #90 x 3   Entered and Authorized by:   D. Thomos Lemons DO   Signed by:   D. Thomos Lemons DO on 07/11/2009   Method used:   Faxed to ...       [facility]             ,  [place]         Ph: [phone]       Fax: [phone]   RxID:   [id number] FREESTYLE LITE   STRP (GLUCOSE BLOOD) test once daily  #90 x 3   Entered and Authorized by:   D. Thomos Lemons DO   Signed by:   D. Thomos Lemons DO on 07/11/2009   Method used:   Faxed to ...       [facility] ([facility]order)             , [place]         Ph: [phone]       Fax: [phone]   RxID:   [id number] CRESTOR 10 MG TABS (ROSUVASTATIN CALCIUM) Take 1 tablet by mouth once per week  #30 x 3   Entered and Authorized by:   D. Thomos Lemons DO   Signed by:   D. Thomos Lemons DO on 07/11/2009   Method used:   Faxed to ...       [facility] (mail-order)             , [place]         Ph: [phone]       Fax: [phone]   RxID:   [id number] DEXILANT 60 MG CPDR (DEXLANSOPRAZOLE) take one by mouth once daily...MUST MAKE OFFICE VISIT BEFORE ANYMORE REFILLS  #90 x 3   Entered and Authorized by:   D. Thomos Lemons DO   Signed by:   D. Thomos Lemons DO on 07/11/2009   Method used:   Faxed to ...       [facility] (mail-order)             , [place]         Ph: [phone]       Fax: [phone]   RxID:   [id number] ALLEGRA 180 MG  TABS (FEXOFENADINE HCL) Take 1 tablet by mouth once a day  #90 x 3   Entered and Authorized by:   D. Thomos Lemons DO   Signed by:   D. Thomos Lemons DO on 07/11/2009   Method used:   Faxed to ...       [facility] (mail-order)             , [place]         Ph: [phone]       Fax: [phone]   RxID:    [id number] SYNTHROID 88 MCG TABS (LEVOTHYROXINE SODIUM) one by mouth once daily  #90 x 3   Entered and Authorized by:   D. Thomos Lemons DO   Signed by:   D. Thomos Lemons DO on 07/11/2009   Method used:   Faxed to ...       [facility] (mail-order)             , [place]         Ph: [phone]       Fax: [phone]   RxID:   [id number] METFORMIN HCL 750 MG TB24 (METFORMIN HCL) 2 tablet by mouth at bedtime  #180 x 3   Entered and Authorized by:   D. Thomos Lemons DO   Signed by:   D. Thomos Lemons DO on 07/11/2009   Method used:   Faxed to ...       [facility]             , [place]         Ph: [phone]  Fax: [phone]   RxID:   [id number] VICTOZA 18 MG/3ML SOLN (LIRAGLUTIDE) inject 1.2 mg Subcutaneously once daily  #1 month x 3   Entered and Authorized by:   D. Thomos Lemons DO   Signed by:   D. Thomos Lemons DO on 07/11/2009   Method used:   Electronically to        [facility] [phone]* (retail)       [address] [place], [place]  [address]       Ph: [phone] or [phone]       Fax: [phone]   RxID:   [id number]   Current Allergies (reviewed today): ! ZOCOR ! EPINEPHRINE   Immunization History:  Influenza Immunization History:    Influenza:  historical (03/14/2009)    Preventive Care Screening  Pap Smear:    Date:  07/11/2009    Results:  Hsyt  Last Flu Shot:    Date:  03/14/2009    Results:  Historical

## 2010-06-28 NOTE — Assessment & Plan Note (Signed)
Summary: adjustment/mhf   Vital Signs:  Patient profile:   55 year old female Height:      66 inches Weight:      167 pounds BMI:     27.05 O2 Sat:      98 % on Room air Temp:     97.6 degrees F oral Pulse rate:   79 / minute Resp:     18 per minute BP sitting:   108 / 60  (right arm) Cuff size:   large  Vitals Entered By: Glendell Docker CMA (May 17, 2010 11:00 AM)  O2 Flow:  Room air CC: chronic back pain Is Patient Diabetic? Yes Pain Assessment Patient in pain? no      Comments adjustment  on neck and shoulders   Primary Care Timathy Newberry:  Dondra Spry DO  CC:  chronic back pain.  History of Present Illness: 55 y/o white female c/o episodic back pain usually sees chiropracter as needed periodic neck adjustments seem to help in the past prev x ray of c spine normal no radiation of pain, no weakness  performs hamstring stretching exercises in AM     Preventive Screening-Counseling & Management  Alcohol-Tobacco     Smoking Status: never  Allergies: 1)  ! Zocor 2)  ! Epinephrine  Past History:  Past Medical History: Hypothyroidism Type 2 diabetes       Abnormal transaminases - negative work up     fatty liver   Atypical chest pain         GERD   Chronic osteoarthritis - right knee Plantar fascitis  Chronic neck and shoulder pain  Social History: Smoking Status:  never  Physical Exam  General:  alert, well-developed, and well-nourished.   Lungs:  normal respiratory effort, normal breath sounds, no crackles, and no wheezes.   Heart:  normal rate, regular rhythm, and no gallop.   Msk:  C 6 rotated left L4-5 rotated right T8-9 rotated right Neurologic:  cranial nerves II-XII intact, strength normal in all extremities, gait normal, and DTRs symmetrical and normal.     Impression & Recommendations:  Problem # 1:  BACK PAIN (ICD-724.5)  pt c/o intermittent neck and shoulder discomfort I suspect neck strain compensatory for weak lumbar  spine utilized myofacial release, muscle energy and HVLA to lumbar, thoracic and c spine pt tolerated well reviewed home exercises  Her updated medication list for this problem includes:    Low-dose Aspirin 81 Mg Tabs (Aspirin) .Marland Kitchen... Take 1 tablet by mouth once a day  Orders: OMT 3-4 Body Regions (62130)  Complete Medication List: 1)  Metformin Hcl 750 Mg Tb24 (Metformin hcl) .Marland Kitchen.. 1 tablet by mouth two times a day 2)  Synthroid 88 Mcg Tabs (Levothyroxine sodium) .... One by mouth once daily 3)  Osteo Bi-flex Regular Strength 250-200 Mg Tabs (Glucosamine-chondroitin) .... Take 1 tablet by mouth once a day 4)  Vitamin C 500 Mg Chew (Ascorbic acid) .... Take 1 tablet by mouth once a day 5)  Low-dose Aspirin 81 Mg Tabs (Aspirin) .... Take 1 tablet by mouth once a day 6)  Dexilant 60 Mg Cpdr (Dexlansoprazole) .... Take 1 tablet by mouth once a day 7)  Freestyle Lancets Misc (Lancets) .... Test once daily 8)  Freestyle Lite Strp (Glucose blood) .... Test once daily 9)  Hydrochlorothiazide 12.5 Mg Caps (Hydrochlorothiazide) .... One by mouth once daily 10)  Citalopram Hydrobromide 10 Mg Tabs (Citalopram hydrobromide) .... Take 1/2 tablet by mouth once a day  Orders Added: 1)  Est. Patient Level II [78469] 2)  OMT 3-4 Body Regions [62952]    Current Allergies (reviewed today): ! ZOCOR ! EPINEPHRINE

## 2010-06-28 NOTE — Assessment & Plan Note (Signed)
Summary: 4 month follow up/mhf   Vital Signs:  Patient profile:   55 year old female Height:      66 inches Weight:      169 pounds BMI:     27.38 O2 Sat:      100 % on Room air Pulse rate:   82 / minute BP sitting:   130 / 82  (left arm) Cuff size:   regular  Vitals Entered By: Payton Spark CMA (April 13, 2010 9:05 AM)  O2 Flow:  Room air CC: F/U DM and thyroid , Type 2 diabetes mellitus follow-up   Primary Care Provider:  Dondra Spry DO  CC:  F/U DM and thyroid  and Type 2 diabetes mellitus follow-up.  History of Present Illness:  Type 2 Diabetes Mellitus Follow-Up      This is a 55 year old woman who presents for Type 2 diabetes mellitus follow-up.  The patient denies weight loss.  The patient denies the following symptoms: chest pain.  Since the last visit the patient reports poor dietary compliance, compliance with medications, and monitoring blood glucose.   mother passed away 1 month ago from advanced alzeheimers  hyperlipidemia - only able to take crestor once weekly due to muscle aches   Current Medications (verified): 1)  Metformin Hcl 750 Mg Tb24 (Metformin Hcl) .Marland Kitchen.. 1 Tablet By Mouth Every Other Day   and 2 Tablets Every Other Day 2)  Synthroid 88 Mcg Tabs (Levothyroxine Sodium) .... One By Mouth Once Daily 3)  Osteo Bi-Flex Regular Strength 250-200 Mg  Tabs (Glucosamine-Chondroitin) .... Take 1 Tablet By Mouth Once A Day 4)  Vitamin C 500 Mg  Chew (Ascorbic Acid) .... Take 1 Tablet By Mouth Once A Day 5)  Low-Dose Aspirin 81 Mg  Tabs (Aspirin) .... Take 1 Tablet By Mouth Once A Day 6)  Dexilant 60 Mg Cpdr (Dexlansoprazole) .... Take 1 Tablet By Mouth Once A Day 7)  Freestyle Lancets   Misc (Lancets) .... Test Once Daily 8)  Freestyle Lite   Strp (Glucose Blood) .... Test Once Daily 9)  Crestor 10 Mg Tabs (Rosuvastatin Calcium) .... Take 1 Tablet By Mouth Once Per Week 10)  Hydrochlorothiazide 12.5 Mg Caps (Hydrochlorothiazide) .... One By Mouth Once  Daily 11)  Citalopram Hydrobromide 10 Mg Tabs (Citalopram Hydrobromide) .... Take 1/2 Tablet By Mouth Once A Day  Allergies (verified): 1)  ! Zocor 2)  ! Epinephrine  Past History:  Past Medical History: Hypothyroidism Type 2 diabetes       Abnormal transaminases - negative work up     fatty liver   Atypical chest pain         GERD   Chronic osteoarthritis - right knee Plantar fascitis   Past Surgical History: Cholecystectomy                Family History: multiple family members with intolerance to statins  .          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    Physical Exam  General:  alert, well-developed, and well-nourished.   Neck:  supple.   Lungs:  normal respiratory effort, normal breath sounds, no crackles, and no  wheezes.   Heart:  normal rate, regular rhythm, and no gallop.   Extremities:  No lower extremity edema   Impression & Recommendations:  Problem # 1:  DYSLIPIDEMIA (ICD-272.4) pt not able to tolerated crestor.  goal LDL < 100.  change to Livalo  The following medications were removed from the medication list:    Crestor 10 Mg Tabs (Rosuvastatin calcium) .Marland Kitchen... Take 1 tablet by mouth once per week Her updated medication list for this problem includes:    Livalo 2 Mg Tabs (Pitavastatin calcium) ..... One by mouth once daily  Future Orders: T-Hepatic Function [id number]) ... 07/30/2010 T-Lipid Profile [id number]) ... 07/30/2010 CRP, high sensitivity-[facility] ([id number]) ... 07/30/2010  Problem # 2:  DIABETES MELLITUS, TYPE II (ICD-250.00) Assessment: Deteriorated titrate dose to 1500 mg daily Pt counseled on diet and exercise.  Her updated medication list for this problem includes:    Metformin Hcl 750 Mg Tb24 (Metformin hcl) .Marland Kitchen... 1  tablet by mouth two times a day    Low-dose Aspirin 81 Mg Tabs (Aspirin) .Marland Kitchen... Take 1 tablet by mouth once a day  Future Orders: T- Hemoglobin A1C ([id number]) ... 07/30/2010  Labs Reviewed: Creat: 0.69 (04/11/2010)     Last Eye Exam: normal (11/14/2009) Reviewed HgBA1c results: 6.4 (04/11/2010)  5.8 (10/31/2009)  Complete Medication List: 1)  Metformin Hcl 750 Mg Tb24 (Metformin hcl) .Marland Kitchen.. 1 tablet by mouth two times a day 2)  Synthroid 88 Mcg Tabs (Levothyroxine sodium) .... One by mouth once daily 3)  Osteo Bi-flex Regular Strength 250-200 Mg Tabs (Glucosamine-chondroitin) .... Take 1 tablet by mouth once a day 4)  Vitamin C 500 Mg Chew (Ascorbic acid) .... Take 1 tablet by mouth once a day 5)  Low-dose Aspirin 81 Mg Tabs (Aspirin) .... Take 1 tablet by mouth once a day 6)  Dexilant 60 Mg Cpdr (Dexlansoprazole) .... Take 1 tablet by mouth once a day 7)  Freestyle Lancets Misc (Lancets) .... Test once daily 8)  Freestyle Lite Strp (Glucose blood) .... Test once daily 9)  Hydrochlorothiazide 12.5 Mg Caps (Hydrochlorothiazide) .... One by mouth once daily 10)  Citalopram Hydrobromide 10 Mg Tabs (Citalopram hydrobromide) .... Take 1/2 tablet by mouth once a day 11)  Livalo 2 Mg Tabs (Pitavastatin calcium) .... One by mouth once daily  Other Orders: Future Orders: T-Basic Metabolic Panel [id number]) ... 07/30/2010  Patient Instructions: 1)  Please schedule a follow-up appointment in 4 months. 2)  BMP prior to visit, ICD-9:  401.9 3)  Hepatic Panel prior to visit, ICD-9: 272.4 4)  Lipid Panel prior to visit, ICD-9: 272.4 5)  High sensitivity CRP :  272.4 6)  HbgA1C prior to visit, ICD-9: 790.29 7)  Please return for lab work one (1) week before your next appointment.  Prescriptions: LIVALO 2 MG TABS (PITAVASTATIN CALCIUM) one by mouth once daily  #30 x 5   Entered and Authorized by:   D. Thomos Lemons DO   Signed by:   D. Thomos Lemons DO on 04/13/2010   Method used:   Print then  Give to Patient   RxID:   [id number] METFORMIN HCL 750 MG TB24 (METFORMIN HCL) 1 tablet by mouth two times a day  #60 x 5   Entered and Authorized by:   D. Thomos Lemons DO   Signed by:   D. Thomos Lemons DO on 04/13/2010   Method used:   [facility]        [facility] [phone][facility] (retail[facility]  [address], [place]  [address]       Ph: [phone] or [phone]       Fax: [phone]   RxID:   [id number]    Orders Added: 1)  T-Basic Metabolic Panel [phone] 2)  T-Hepatic Function [id number] 3)  T-Lipid Profile [80061-22930] 4)  CRP, high sensitivity-[facility] [phone] 5)  T- Hemoglobin A1C [83036-23375] 6)  Est. Patient Level III [93235]

## 2010-06-28 NOTE — Miscellaneous (Signed)
Summary: Orders Update  Clinical Lists Changes  Orders: Added new Test order of TLB-BMP (Basic Metabolic Panel-BMET) (80048-METABOL) - Signed 

## 2010-06-28 NOTE — Progress Notes (Signed)
Summary: test results, victoza not working, Architectural technologist Note Call from Patient Call back at [facility] [facility] [phone]   Caller: pt live Call For: yoo  Summary of Call: results of her tests from last week.  A new med victoza is not working for her it is taking her sugar too low.  [facility] needs to verify the quantity of crestor.  they are holding all her meds till they hear from Dr Artist Pais.  [phone] Initial call taken by: Roselle Locus,  July 17, 2009 10:21 AM  Follow-up for Phone Call        call returned to patient for clarification of her message. Patient is requesting her Ultrasound Results. She states the Victoza is dropping her blood sugar too low. Her blood sugar Sunday was 100, and she states she felt lethargic and had no energy, and on Saturday she was out of town and she state she felt nausea and lightheaded. She would like to know if she could go back to taking the Byetta.  Pharmacy needed clarification of Crestor.  Rx sent in for quantity of 30 but rx reads take weekly. Rx quantity updated  Follow-up by: Glendell Docker CMA,  July 17, 2009 5:34 PM  Additional Follow-up for Phone Call Additional follow up Details #1::        darlene, call radiology dept and find out why ultrasound hasn't been read  ok to go back to byetta.  see rx Additional Follow-up by: D. Thomos Lemons DO,  July 18, 2009 1:14 PM    Additional Follow-up for Phone Call Additional follow up Details #2::    spoke with Eber Jones in radiology she is looking into why the ultrasound has not yet been read Follow-up by: Glendell Docker CMA,  July 18, 2009 1:48 PM  Additional Follow-up for Phone Call Additional follow up Details #3:: Details for Additional Follow-up Action Taken: call pt - pelvic ultrasound confirms benign appearing cyst.  CA 125 (tumor marker ) was normal. I advise repeat pelvic u/s in 1 yr to ensure stability Additional Follow-up by: D. Thomos Lemons DO,   July 18, 2009 5:01 PM  New/Updated Medications: BYETTA 5 MCG PEN 5 MCG/0.02ML SOLN (EXENATIDE) inject 5 micrograms two times a day Prescriptions: BYETTA 5 MCG PEN 5 MCG/0.02ML SOLN (EXENATIDE) inject 5 micrograms two times a day  #3 month x 3   Entered and Authorized by:   D. Thomos Lemons DO   Signed by:   D. Thomos Lemons DO on 07/18/2009   Method used:   Faxed to ...       [facility] (mail-order)             , [place]         Ph: [phone]       Fax: [phone]   RxID:   [id number] CRESTOR 10 MG TABS (ROSUVASTATIN CALCIUM) Take 1 tablet by mouth once per week  #12 x 3   Entered by:   Glendell Docker CMA   Authorized by:   D. Thomos Lemons DO   Signed by:   D. Thomos Lemons DO on 07/18/2009   Method used:   Faxed to ...       [facility] (mail-order)             , [place]         Ph: [phone]       Fax: [phone]   RxID:   [id number]  patient has been advised per  Dr Artist Pais instructions Glendell Docker CMA  July 19, 2009 8:55 AM

## 2010-06-28 NOTE — Progress Notes (Signed)
Summary: Doppler Results  Phone Note Outgoing Call   Summary of Call: call pt - venous doppler is neg for DVT Initial call taken by: D. Thomos Lemons DO,  August 08, 2009 4:49 PM  Follow-up for Phone Call        patient advised per Dr Artist Pais instructions Follow-up by: Glendell Docker CMA,  August 08, 2009 4:54 PM

## 2010-06-28 NOTE — Miscellaneous (Signed)
Summary: Eye Exam  Clinical Lists Changes  Observations: Added new observation of DMEYEEXAMNXT: 11/2010 (12/06/2009 15:05) Added new observation of DMEYEEXMRES: normal (11/14/2009 15:06) Added new observation of EYE EXAM BY: [facility] (11/14/2009 15:06) Added new observation of DIAB EYE EX: normal (11/14/2009 15:06)       Diabetes Management Exam:    Eye Exam:       Eye Exam done elsewhere          Date: 11/14/2009          Results: normal          Done by: [facility]

## 2010-06-28 NOTE — Letter (Signed)
Summary: Care Consideration Regarding Eye Exam/[employer]  Care Consideration Regarding Eye Exam/[employer]   Imported By: Lanelle Bal 12/12/2009 09:51:46  _____________________________________________________________________  External Attachment:    Type:   Image     Comment:   External Document

## 2010-06-28 NOTE — Progress Notes (Signed)
Summary: Refill & Sample  Phone Note Call from Patient Call back at Home Phone [phone]   Caller: Patient Call For: D. Thomos Lemons DO Reason for Call: Refill Medication Summary of Call: patient called and left voice message requesting samples of Crestor. Her message states her copay is $100 for 12 pills and she would rather not pay that much if she is only taking them once a week. She is also requesting [facility] [place]. That rx would be at no cost to [facility]. Initial call taken by: Glendell Docker CMA,  October 25, 2009 3:00 PM  Follow-up for Phone Call        call returned to patient at [phone], no answer. A detailed voice message was left informing  patient  rx for Dexilant has been sent to pharmacy and samples for Crestor left at the front desk for pick up Follow-up by: Glendell Docker CMA,  October 25, 2009 3:09 PM    New/Updated Medications: DEXILANT 60 MG CPDR (DEXLANSOPRAZOLE) Take 1 tablet by mouth once a day Prescriptions: DEXILANT 60 MG CPDR (DEXLANSOPRAZOLE) Take 1 tablet by mouth once a day  #30 x 5   Entered by:   Glendell Docker CMA   Authorized by:   D. Thomos Lemons DO   Signed by:   Glendell Docker CMA on 10/25/2009   Method used:   [facility] to        [facility] [phone][facility] (retail)       [address] [place], [place]  [address]       Ph: [phone] or [phone]       Fax: [phone]   RxID:   [id number]

## 2010-06-28 NOTE — Assessment & Plan Note (Signed)
Summary: 1 MONTH FOLLOW UP/MHF   Vital Signs:  Patient profile:   55 year old female Weight:      165.25 pounds BMI:     26.77 O2 Sat:      96 % on Room air Temp:     97.9 degrees F oral Pulse rate:   82 / minute Pulse rhythm:   regular Resp:     16 per minute BP sitting:   114 / 70  (right arm) Cuff size:   regular  Vitals Entered By: Glendell Docker CMA (December 15, 2009 8:01 AM)  O2 Flow:  Room air CC: Rm 2- 1 Month Follow up  Is Patient Diabetic? Yes Comments she has not had to take the Traizolam , because the Citalopram at 1/2 was working well   Primary Care Provider:  D. Thomos Lemons DO  CC:  Rm 2- 1 Month Follow up .  History of Present Illness: 55 y/o white female for f/u re:  stress rxn doing much better since starting citalopram only takes 1/2 dose sleep is improved, has not needed triazolam no side effects noted mother has good and bad days  Preventive Screening-Counseling & Management  Alcohol-Tobacco     Smoking Status: never  Allergies: 1)  ! Zocor 2)  ! Epinephrine  Past History:  Past Medical History: Hypothyroidism Type 2 diabetes       Abnormal transaminases - negative work up     fatty liver  Atypical chest pain       GERD   Chronic osteoarthritis - right knee Plantar fascitis   Physical Exam  General:  alert, well-developed, and well-nourished.   Lungs:  normal respiratory effort and normal breath sounds.   Heart:  normal rate, regular rhythm, and no gallop.   Neurologic:  cranial nerves II-XII intact and gait normal.   Psych:  normally interactive, good eye contact, not anxious appearing, and not depressed appearing.     Impression & Recommendations:  Problem # 1:  ADJUSTMENT DISORDER WITHOUT DEPRESSED MOOD (ICD-309.9) Assessment Improved good response to low dose SSRI.  Maintain current dose.  Complete Medication List: 1)  Metformin Hcl 750 Mg Tb24 (Metformin hcl) .Marland Kitchen.. 1 tablet by mouth every other day   and 2 tablets every  other day 2)  Synthroid 88 Mcg Tabs (Levothyroxine sodium) .... One by mouth once daily 3)  Osteo Bi-flex Regular Strength 250-200 Mg Tabs (Glucosamine-chondroitin) .... Take 1 tablet by mouth once a day 4)  Vitamin C 500 Mg Chew (Ascorbic acid) .... Take 1 tablet by mouth once a day 5)  Low-dose Aspirin 81 Mg Tabs (Aspirin) .... Take 1 tablet by mouth once a day 6)  Dexilant 60 Mg Cpdr (Dexlansoprazole) .... Take 1 tablet by mouth once a day 7)  Freestyle Lancets Misc (Lancets) .... Test once daily 8)  Freestyle Lite Strp (Glucose blood) .... Test once daily 9)  Crestor 10 Mg Tabs (Rosuvastatin calcium) .... Take 1 tablet by mouth once per week 10)  Bd U/f Short Pen Needle 31g X 8 Mm Misc (Insulin pen needle) .... Use for insulin injection two times a day 11)  Hydrochlorothiazide 12.5 Mg Caps (Hydrochlorothiazide) .... One by mouth once daily 12)  Citalopram Hydrobromide 10 Mg Tabs (Citalopram hydrobromide) .... 1/2 tablet by mouth once daily 13)  Triazolam 0.125 Mg Tabs (Triazolam) .... 1/2 to one tab by mouth at bedtime prn  Patient Instructions: 1)  Please schedule a follow-up appointment in 4 months. Prescriptions: CITALOPRAM HYDROBROMIDE 10  MG TABS (CITALOPRAM HYDROBROMIDE) 1/2 tablet by mouth once daily  #45 x 1   Entered and Authorized by:   D. Thomos Lemons DO   Signed by:   D. Thomos Lemons DO on 12/15/2009   Method used:   [facility] to        [facility] [phone][facility] (retail)       [address] [place], [place]  [address]       Ph: [phone] or [phone]       Fax: [phone]   RxID:   [id number]   Current Allergies (reviewed today): ! ZOCOR ! EPINEPHRINE

## 2010-06-28 NOTE — Progress Notes (Signed)
Summary: lab orders?  Phone Note Other Incoming   Caller: Katrina @ [facility]  Ext [phone] Summary of Call: Katrina called stating that pt is in the lab and they need order for blood work. Please advise what labs to order. Nicki Guadalajara Fergerson CMA Duncan Dull)  April 11, 2010 8:39 AM   Follow-up for Phone Call         HGB A1C, TSH, BMET, LFT, LIPID,  entered  Follow-up by: Glendell Docker CMA,  April 11, 2010 11:48 AM

## 2010-06-28 NOTE — Progress Notes (Signed)
Summary: med does not come in a tab   Phone Note From Pharmacy   Caller: Lodema Hong [phone] Call For: Yahia Bottger   Summary of Call: the hydrochlorizide does not come in 12.5 tablets only capsules.  what do you want them to give the patient  Initial call taken by: Roselle Locus,  August 08, 2009 3:17 PM  Follow-up for Phone Call        instruct pt to take full cap of 12.5.  see rx Follow-up by: D. Thomos Lemons DO,  August 08, 2009 4:04 PM    New/Updated Medications: HYDROCHLOROTHIAZIDE 12.5 MG CAPS (HYDROCHLOROTHIAZIDE) one by mouth once daily Prescriptions: HYDROCHLOROTHIAZIDE 12.5 MG CAPS (HYDROCHLOROTHIAZIDE) one by mouth once daily  #30 x 1   Entered and Authorized by:   D. Thomos Lemons DO   Signed by:   D. Thomos Lemons DO on 08/08/2009   Method used:   [facility] to        [facility] [phone]* (retail)       [address] [place], [place]  [address]       Ph: [phone] or [phone]       Fax: [phone]   RxID:   [id number]

## 2010-06-28 NOTE — Miscellaneous (Signed)
Summary: Lab Orders  Clinical Lists Changes  Orders: Added new Test order of T-Basic Metabolic Panel [id number]) - Signed Added new Test order of T-Hepatic Function [id number]) - Signed Added new Test order of T- Hemoglobin A1C ([id number]) - Signed

## 2010-06-28 NOTE — Assessment & Plan Note (Signed)
Summary: 4 month follow up/mhf   Vital Signs:  Patient profile:   55 year old female Weight:      165.50 pounds BMI:     26.81 O2 Sat:      98 % on Room air Temp:     97.8 degrees F oral Pulse rate:   83 / minute Pulse rhythm:   regular Resp:     16 per minute BP sitting:   116 / 60  (right arm) Cuff size:   regular  Vitals Entered By: Glendell Docker CMA (November 08, 2009 8:08 AM)  O2 Flow:  Room air CC: Rm 2- 4 Month Follow up  Is Patient Diabetic? Yes Did you bring your meter with you today? No   Primary Care Provider:  DThomos Lemons DO  CC:  Rm 2- 4 Month Follow up .  History of Present Illness: 55 y/o white female presents with stress rxn mother having significant health problems - age 45.  multiple hosp admissions.   in and out of hosp for 1 yr.  heart attack last yr. hip fracture, ankle fracture  memory getting worse.  difficult to watch mother deteriorate so quickly  took medication 6 yrs ago (not sure if it was SSRI)-  after break up of relationship    Preventive Screening-Counseling & Management  Alcohol-Tobacco     Smoking Status: never  Allergies: 1)  ! Zocor 2)  ! Epinephrine  Past History:  Past Medical History: Hypothyroidism Type 2 diabetes       Abnormal transaminases - negative work up     fatty liver  Atypical chest pain      GERD   Chronic osteoarthritis - right knee Plantar fascitis   Past Surgical History: Cholecystectomy               Family History: multiple family members with intolerance to statins .             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   Lungs:  normal respiratory effort and normal breath sounds.   Heart:  normal rate, regular rhythm, and no gallop.   Psych:  labile affect and tearful.      Impression & Recommendations:  Problem # 1:  ADJUSTMENT DISORDER WITHOUT DEPRESSED MOOD (ICD-309.9) difficulty managing stress due to elderly mother with significant health issues. she defers referral to counselor trial of citalopram.   pt understands to call if depressive symptoms get worse.  use triazolam for sleep as needed  Complete Medication List: 1)  Metformin Hcl 750 Mg Tb24 (Metformin hcl) .... 2 tablet by mouth at bedtime 2)  Synthroid 88 Mcg Tabs (Levothyroxine sodium) .... One by mouth once daily 3)  Osteo Bi-flex Regular Strength 250-200 Mg Tabs (Glucosamine-chondroitin) .... Take 1 tablet by mouth once a day 4)  Vitamin C 500 Mg Chew (Ascorbic acid) .... Take 1 tablet by mouth once a day 5)  Low-dose Aspirin 81 Mg Tabs (Aspirin) .... Take 1 tablet by mouth once a day 6)  Dexilant 60 Mg Cpdr (Dexlansoprazole) .... Take 1 tablet by mouth once a day 7)  Freestyle Lancets Misc (Lancets) .... Test once daily 8)  Freestyle Lite Strp (Glucose blood) .Marland KitchenMarland KitchenMarland Kitchen  Test once daily 9)  Crestor 10 Mg Tabs (Rosuvastatin calcium) .... Take 1 tablet by mouth once per week 10)  Byetta 5 Mcg Pen 5 Mcg/0.62ml Soln (Exenatide) .... Inject 5 micrograms two times a day 11)  Bd U/f Short Pen Needle 31g X 8 Mm Misc (Insulin pen needle) .... Use for insulin injection two times a day 12)  Hydrochlorothiazide 12.5 Mg Caps (Hydrochlorothiazide) .... One by mouth once daily 13)  Citalopram Hydrobromide 10 Mg Tabs (Citalopram hydrobromide) .... 1/2 by mouth qam x 7 days, then one by mouth once daily 14)  Triazolam 0.125 Mg Tabs (Triazolam) .... 1/2 to one tab by mouth at bedtime prn  Patient Instructions: 1)  Please schedule a follow-up appointment in 1 month. Prescriptions: TRIAZOLAM 0.125 MG TABS (TRIAZOLAM) 1/2 to one tab by mouth at bedtime prn  #30 x 0   Entered and Authorized by:   D. Thomos Lemons DO   Signed by:   D. Thomos Lemons DO on 11/08/2009   Method used:   Print then Give to Patient   RxID:    [id number] CITALOPRAM HYDROBROMIDE 10 MG TABS (CITALOPRAM HYDROBROMIDE) 1/2 by mouth qam x 7 days, then one by mouth once daily  #30 x 2   Entered and Authorized by:   D. Thomos Lemons DO   Signed by:   D. Thomos Lemons DO on 11/08/2009   Method used:   [facility] to        [facility] [phone]* (retail)       [address] [place], [place]  [address]       Ph: [phone] or [phone]       Fax: [phone]   RxID:   [id number]    Preventive Care Screening  Mammogram:    Date:  11/07/2009    Results:  Done

## 2010-06-28 NOTE — Assessment & Plan Note (Signed)
Summary: 6 week follow up/mhf   Vital Signs:  Patient profile:   55 year old female Height:      66 inches Weight:      165 pounds BMI:     26.73 Pulse rate:   74 / minute BP sitting:   116 / 78  (left arm) Cuff size:   regular  Vitals Entered By: Payton Spark CMA (September 21, 2009 8:17 AM) CC: F/U swelling. Doing better but feeling more tired.   Primary Care Provider:  Dondra Spry DO  CC:  F/U swelling. Doing better but feeling more tired.Marland Kitchen  History of Present Illness: 55 y/o white female for f/u re:  edema LE dopplers were neg for DVT  she still has trace edema increased urinary freq with hctz no other side effect some fatigue (may be allergy related) no wt change  Current Medications (verified): 1)  Metformin Hcl 750 Mg Tb24 (Metformin Hcl) .... 2 Tablet By Mouth At Bedtime 2)  Synthroid 88 Mcg Tabs (Levothyroxine Sodium) .... One By Mouth Once Daily 3)  Allegra 180 Mg  Tabs (Fexofenadine Hcl) .... Take 1 Tablet By Mouth Once A Day 4)  Osteo Bi-Flex Regular Strength 250-200 Mg  Tabs (Glucosamine-Chondroitin) .... Take 1 Tablet By Mouth Once A Day 5)  Vitamin C 500 Mg  Chew (Ascorbic Acid) .... Take 1 Tablet By Mouth Once A Day 6)  Low-Dose Aspirin 81 Mg  Tabs (Aspirin) .... Take 1 Tablet By Mouth Once A Day 7)  Dexilant 60 Mg Cpdr (Dexlansoprazole) .... Take One By Mouth Once Daily...must Make Office Visit Before Anymore Refills 8)  Freestyle Lancets   Misc (Lancets) .... Test Once Daily 9)  Freestyle Lite   Strp (Glucose Blood) .... Test Once Daily 10)  Crestor 10 Mg Tabs (Rosuvastatin Calcium) .... Take 1 Tablet By Mouth Once Per Week 11)  Byetta 5 Mcg Pen 5 Mcg/0.15ml Soln (Exenatide) .... Inject 5 Micrograms Two Times A Day 12)  Bd U/f Short Pen Needle 31g X 8 Mm Misc (Insulin Pen Needle) .... Use For Insulin Injection Two Times A Day 13)  Hydrochlorothiazide 12.5 Mg Caps (Hydrochlorothiazide) .... One By Mouth Once Daily  Allergies (verified): 1)  ! Zocor 2)   ! Epinephrine  Past History:  Past Medical History: Hypothyroidism Type 2 diabetes      Abnormal transaminases - negative work up     fatty liver  Atypical chest pain      GERD   Chronic osteoarthritis - right knee Plantar fascitis   Family History: multiple family members with intolerance to statins .            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   Lungs:  normal respiratory effort and normal breath sounds.   Heart:  normal rate, regular rhythm, and no gallop.   Extremities:  trace left pedal edema and trace right pedal edema.     Impression & Recommendations:  Problem # 1:  ELEVATED BLOOD PRESSURE WITHOUT DIAGNOSIS OF HYPERTENSION (ICD-796.2) LE edema better with HCTZ.  some mild fatigue.  I doubt related to hctz.  Her updated medication list for this problem includes:    Hydrochlorothiazide 12.5 Mg Caps (Hydrochlorothiazide) ..... One by mouth once daily  Complete Medication List: 1)  Metformin Hcl  750 Mg Tb24 (Metformin hcl) .... 2 tablet by mouth at bedtime 2)  Synthroid 88 Mcg Tabs (Levothyroxine sodium) .... One by mouth once daily 3)  Allegra 180 Mg Tabs (Fexofenadine hcl) .... Take 1 tablet by mouth once a day 4)  Osteo Bi-flex Regular Strength 250-200 Mg Tabs (Glucosamine-chondroitin) .... Take 1 tablet by mouth once a day 5)  Vitamin C 500 Mg Chew (Ascorbic acid) .... Take 1 tablet by mouth once a day 6)  Low-dose Aspirin 81 Mg Tabs (Aspirin) .... Take 1 tablet by mouth once a day 7)  Dexilant 60 Mg Cpdr (Dexlansoprazole) .... Take one by mouth once daily...must make office visit before anymore refills 8)  Freestyle Lancets Misc (Lancets) .... Test once daily 9)  Freestyle Lite Strp (Glucose blood) .... Test once daily 10)  Crestor 10 Mg Tabs (Rosuvastatin calcium) .... Take 1 tablet by  mouth once per week 11)  Byetta 5 Mcg Pen 5 Mcg/0.62ml Soln (Exenatide) .... Inject 5 micrograms two times a day 12)  Bd U/f Short Pen Needle 31g X 8 Mm Misc (Insulin pen needle) .... Use for insulin injection two times a day 13)  Hydrochlorothiazide 12.5 Mg Caps (Hydrochlorothiazide) .... One by mouth once daily Prescriptions: HYDROCHLOROTHIAZIDE 12.5 MG CAPS (HYDROCHLOROTHIAZIDE) one by mouth once daily  #90 x 3   Entered and Authorized by:   D. Thomos Lemons DO   Signed by:   D. Thomos Lemons DO on 09/21/2009   Method used:   [facility] to        [facility] [phone]* (retail)       [address] [place], [place]  [address]       Ph: [phone] or [phone]       Fax: [phone]   RxID:   [id number]

## 2010-06-28 NOTE — Assessment & Plan Note (Signed)
Summary: ear ache/mhf   Vital Signs:  Patient profile:   55 year old female Weight:      167 pounds BMI:     27.05 O2 Sat:      100 % on Room air Temp:     98.1 degrees F oral Pulse rate:   66 / minute Pulse rhythm:   regular Resp:     18 per minute BP sitting:   110 / 60  (right arm) Cuff size:   regular  Vitals Entered By: Glendell Docker CMA (February 06, 2010 12:00 PM)  O2 Flow:  Room air CC: left ear ache, Ear pain Is Patient Diabetic? No Pain Assessment Patient in pain? no      Comments c/o severe left ear ache onset last night   Primary Care Provider:  Dondra Spry DO  CC:  left ear ache and Ear pain.  History of Present Illness:  Ear Pain      This is a 55 year old woman who presents with Ear pain.  The patient denies ear discharge, fever, and sinus pain.  The pain is located in the left ear.  The pain is described as intermittent.  Associated symptoms include popping or crackling sounds.  The patient denies night grinding of teeth.    Preventive Screening-Counseling & Management  Alcohol-Tobacco     Smoking Status: current  Allergies: 1)  ! Zocor 2)  ! Epinephrine  Past History:  Past Medical History: Hypothyroidism Type 2 diabetes       Abnormal transaminases - negative work up     fatty liver  Atypical chest pain        GERD   Chronic osteoarthritis - right knee Plantar fascitis   Social History: Smoking Status:  current  Physical Exam  General:  alert, well-developed, and well-nourished.   Ears:  right and left TM slightly retracted Lungs:  normal respiratory effort and normal breath sounds.   Heart:  normal rate, regular rhythm, and no gallop.     Impression & Recommendations:  Problem # 1:  EUSTACHIAN TUBE DYSFUNCTION (ICD-381.81) left ear pain from ETD.  use nasal steroid and decongestants.  Patient advised to call office if symptoms persist or worsen.  Complete Medication List: 1)  Metformin Hcl 750 Mg Tb24 (Metformin hcl)  .Marland Kitchen.. 1 tablet by mouth every other day   and 2 tablets every other day 2)  Synthroid 88 Mcg Tabs (Levothyroxine sodium) .... One by mouth once daily 3)  Osteo Bi-flex Regular Strength 250-200 Mg Tabs (Glucosamine-chondroitin) .... Take 1 tablet by mouth once a day 4)  Vitamin C 500 Mg Chew (Ascorbic acid) .... Take 1 tablet by mouth once a day 5)  Low-dose Aspirin 81 Mg Tabs (Aspirin) .... Take 1 tablet by mouth once a day 6)  Dexilant 60 Mg Cpdr (Dexlansoprazole) .... Take 1 tablet by mouth once a day 7)  Freestyle Lancets Misc (Lancets) .... Test once daily 8)  Freestyle Lite Strp (Glucose blood) .... Test once daily 9)  Crestor 10 Mg Tabs (Rosuvastatin calcium) .... Take 1 tablet by mouth once per week 10)  Hydrochlorothiazide 12.5 Mg Caps (Hydrochlorothiazide) .... One by mouth once daily 11)  Citalopram Hydrobromide 10 Mg Tabs (Citalopram hydrobromide) .... 1/2 tablet by mouth once daily 12)  Fluticasone Propionate 50 Mcg/act Susp (Fluticasone propionate) .... 2 sprays each nostril once daily  Other Orders: Influenza Vaccine NON MCR (16109) Flu Vaccine 5yrs + MEDICARE PATIENTS (U0454)  Patient Instructions: 1)  Use  claritin D over the counter 2)  Take ibuprofen 400 mg two times a day as needed 3)  Call our office if your symptoms do not  improve or gets worse. Prescriptions: FLUTICASONE PROPIONATE 50 MCG/ACT SUSP (FLUTICASONE PROPIONATE) 2 sprays each nostril once daily  #1 x 2   Entered and Authorized by:   D. Thomos Lemons DO   Signed by:   D. Thomos Lemons DO on 02/06/2010   Method used:   [facility]ically to        [facility] [phone]* (retail)       [address] [place], [place]  [address]       Ph: [phone] or [phone]       Fax: [phone]   RxID:   [id number]   Current Allergies (reviewed today): ! ZOCOR ! EPINEPHRINE    Immunizations Administered:  Influenza Vaccine # 1:    Vaccine Type: Fluvax Non-MCR    Site: left deltoid    Mfr:  GlaxoSmithK[employer]    Dose: 0.5 ml    Route: IM    Given by: Glendell Docker CMA    Exp. Date: 11/24/2010    Lot #: [id number]    VIS given: 12/19/09 version given February 06, 2010.  Flu Vaccine Consent Questions:    Do you have a history of severe allergic reactions to this vaccine? no    Any prior history of allergic reactions to egg and/or gelatin? no    Do you have a sensitivity to the preservative Thimersol? no    Do you have a past history of Guillan-Barre Syndrome? no    Do you currently have an acute febrile illness? no    Have you ever had a severe reaction to latex? no    Vaccine information given and explained to patient? yes    Are you currently pregnant? no

## 2010-06-28 NOTE — Assessment & Plan Note (Signed)
Summary: FEET AND LEGS SWELLING/MHF COMING AT 2:45   Vital Signs:  Patient profile:   55 year old female Height:      66 inches Weight:      165.25 pounds BMI:     26.77 O2 Sat:      98 % on Room air Temp:     98.1 degrees F oral Pulse rate:   106 / minute Pulse rhythm:   regular Resp:     18 per minute BP sitting:   130 / 70  (right arm) Cuff size:   regular  Vitals Entered By: Glendell Docker CMA (August 08, 2009 2:48 PM)  O2 Flow:  Room air CC: Rm 2- swelling in feet   Primary Care Provider:  DThomos Lemons DO  CC:  Rm 2- swelling in feet.  History of Present Illness: 55 y/o white female c/o bilateral feet and ankle swelling seen at podiatrist this morning and had blood work done (arthritis panel)   ptwas advised to follow up with Dr Artist Pais, onset about 3 weeks in left foot and now is in right for a week and a half  swelling is not relieved at night, she says that it last through out the day her current job requires more sitting no recent leg injury no long car trip or prolonged sitting no chest pain no SOB no urinary change no use of otc analgesics  Allergies: 1)  ! Zocor 2)  ! Epinephrine  Past History:  Past Medical History: Hypothyroidism Type 2 diabetes      Abnormal transaminases - negative work up     fatty liver  Atypical chest pain     GERD   Chronic osteoarthritis - right knee Plantar fascitis   Past Surgical History: Cholecystectomy              Family History: multiple family members with intolerance to statins .           Social History: Occupation:  Chiropractor. Married - 62 year old son Never Smoked  Alcohol use-no                 Physical Exam  General:  alert, well-developed, and well-nourished.   Lungs:  normal respiratory effort and normal breath sounds.   Heart:  normal rate, regular rhythm, and no gallop.   Abdomen:  soft, non-tender, normal bowel sounds, no masses, no hepatomegaly, and no  splenomegaly.   Extremities:  left ankle pitting edema,  trace ankle edema Neurologic:  cranial nerves II-XII intact and gait normal.   Inguinal Nodes:  no R inguinal adenopathy and no L inguinal adenopathy.   Psych:  normally interactive, good eye contact, not anxious appearing, and not depressed appearing.     Impression & Recommendations:  Problem # 1:  LEG EDEMA (ICD-782.3) 55 y/o with unexplained LE edema.  I suspect symptoms from new job - more sitting.  left ankle with more edema.  rule out DVT. start diurectic.  low salt diet.  Orders: Ultrasound (Ultrasound)  Her updated medication list for this problem includes:    Hydrochlorothiazide 12.5 Mg Caps (Hydrochlorothiazide) ..... One by mouth once daily  Complete Medication List: 1)  Metformin Hcl 750 Mg Tb24 (Metformin hcl) .... 2 tablet by mouth at bedtime 2)  Synthroid 88 Mcg Tabs (Levothyroxine sodium) .... One by mouth once daily 3)  Allegra 180 Mg Tabs (Fexofenadine hcl) .... Take 1 tablet by mouth once a day 4)  Osteo Bi-flex Regular Strength  250-200 Mg Tabs (Glucosamine-chondroitin) .... Take 1 tablet by mouth once a day 5)  Vitamin C 500 Mg Chew (Ascorbic acid) .... Take 1 tablet by mouth once a day 6)  Low-dose Aspirin 81 Mg Tabs (Aspirin) .... Take 1 tablet by mouth once a day 7)  Dexilant 60 Mg Cpdr (Dexlansoprazole) .... Take one by mouth once daily...must make office visit before anymore refills 8)  Freestyle Lancets Misc (Lancets) .... Test once daily 9)  Freestyle Lite Strp (Glucose blood) .... Test once daily 10)  Crestor 10 Mg Tabs (Rosuvastatin calcium) .... Take 1 tablet by mouth once per week 11)  Byetta 5 Mcg Pen 5 Mcg/0.94ml Soln (Exenatide) .... Inject 5 micrograms two times a day 12)  Bd U/f Short Pen Needle 31g X 8 Mm Misc (Insulin pen needle) .... Use for insulin injection two times a day 13)  Hydrochlorothiazide 12.5 Mg Caps (Hydrochlorothiazide) .... One by mouth once daily  Other Orders: UA  Dipstick w/o Micro (manual) (16109)  Patient Instructions: 1)  Please schedule a follow-up appointment in 6 weeks. 2)  Limit your sodium intake to 2 - 2.5 grams of salt per day Prescriptions: HYDROCHLOROTHIAZIDE 12.5 MG TABS (HYDROCHLOROTHIAZIDE) 1/2 tab by mouth qam  #30 x 1   Entered and Authorized by:   D. Thomos Lemons DO   Signed by:   D. Thomos Lemons DO on 08/08/2009   Method used:   [facility]ronically to        [facility] [phone]* (retail)       [address] [place], [place]  [address]       Ph: [phone] or [phone]       Fax: [phone]   RxID:   [id number]   Current Allergies (reviewed today): ! ZOCOR ! EPINEPHRINE    Laboratory Results   Urine Tests    Routine Urinalysis   Color: yellow Appearance: Clear Glucose: negative   (Normal Range: Negative) Bilirubin: negative   (Normal Range: Negative) Ketone: negative   (Normal Range: Negative) Spec. Gravity: >=1.030   (Normal Range: 1.003-1.035) Blood: negative   (Normal Range: Negative) pH: 5.0   (Normal Range: 5.0-8.0) Protein: 30   (Normal Range: Negative) Urobilinogen: 0.2   (Normal Range: 0-1) Nitrite: negative   (Normal Range: Negative) Leukocyte Esterace: negative   (Normal Range: Negative)

## 2010-07-10 ENCOUNTER — Telehealth: Payer: Self-pay | Admitting: Internal Medicine

## 2010-07-18 NOTE — Progress Notes (Signed)
Summary: Synthroid Refill  Phone Note Refill Request Message from:  Fax from Pharmacy on July 10, 2010 11:44 AM  Refills Requested: Medication #1:  SYNTHROID 88 MCG TABS one by mouth once daily   Dosage confirmed as above?Dosage Confirmed   Brand Name Necessary? No   Supply Requested: 3 months Refill request fo rLevothyroxine 0.88 mg [facility]   Method Requested: Electronic Next Appointment Scheduled: 08/10/2010 Initial call taken by: Glendell Docker CMA,  July 10, 2010 11:45 AM  Follow-up for Phone Call        Rx completed in Dr. Tiajuana Amass Follow-up by: Glendell Docker CMA,  July 10, 2010 11:47 AM    Prescriptions: SYNTHROID 88 MCG TABS (LEVOTHYROXINE SODIUM) one by mouth once daily  #90 x 0   Entered by:   Glendell Docker CMA   Authorized by:   D. Thomos Lemons DO   Signed by:   Glendell Docker CMA on 07/10/2010   Method used:   Faxed to ...       [facility] (mail-order)             , [place]         Ph: [phone]       Fax: [phone]   RxID:   [id number]   Appended Document: Synthroid Refill Spoke to Myrene Buddy at [facility]. She verified receipt of electronic authorization and states that med has already been shipped to pt's address. She will remove request from their system.

## 2010-07-30 ENCOUNTER — Telehealth (INDEPENDENT_AMBULATORY_CARE_PROVIDER_SITE_OTHER): Payer: Self-pay | Admitting: *Deleted

## 2010-08-07 NOTE — Progress Notes (Signed)
  Phone Note Other Incoming   Request: Send information Summary of Call: Request for records received from [facility]. Request forwarded to [facility].  Last 5 yrs.

## 2010-08-08 ENCOUNTER — Encounter: Payer: Self-pay | Admitting: Internal Medicine

## 2010-08-10 ENCOUNTER — Ambulatory Visit: Payer: Self-pay | Admitting: Internal Medicine

## 2010-08-17 ENCOUNTER — Other Ambulatory Visit: Payer: Self-pay | Admitting: Internal Medicine

## 2010-08-17 LAB — HEPATIC FUNCTION PANEL
ALT: 20 U/L (ref 0–35)
AST: 19 U/L (ref 0–37)
Albumin: 4.6 g/dL (ref 3.5–5.2)
Alkaline Phosphatase: 68 U/L (ref 39–117)
Indirect Bilirubin: 0.7 mg/dL (ref 0.0–0.9)
Total Protein: 7.3 g/dL (ref 6.0–8.3)

## 2010-08-17 LAB — LIPID PANEL
Cholesterol: 203 mg/dL — ABNORMAL HIGH (ref 0–200)
HDL: 48 mg/dL (ref >39)
Triglycerides: 315 mg/dL — ABNORMAL HIGH (ref <150)

## 2010-08-17 LAB — BASIC METABOLIC PANEL
BUN: 14 mg/dL (ref 6–23)
Calcium: 9.9 mg/dL (ref 8.4–10.5)
Chloride: 96 mEq/L (ref 96–112)
Creat: 0.7 mg/dL (ref 0.40–1.20)

## 2010-08-24 ENCOUNTER — Encounter: Payer: Self-pay | Admitting: Internal Medicine

## 2010-08-24 ENCOUNTER — Ambulatory Visit (INDEPENDENT_AMBULATORY_CARE_PROVIDER_SITE_OTHER): Payer: Managed Care, Other (non HMO) | Admitting: Internal Medicine

## 2010-08-24 DIAGNOSIS — J309 Allergic rhinitis, unspecified: Secondary | ICD-10-CM

## 2010-08-24 DIAGNOSIS — I10 Essential (primary) hypertension: Secondary | ICD-10-CM

## 2010-08-24 DIAGNOSIS — E119 Type 2 diabetes mellitus without complications: Secondary | ICD-10-CM

## 2010-08-24 HISTORY — DX: Essential (primary) hypertension: I10

## 2010-08-24 MED ORDER — LEVOCETIRIZINE DIHYDROCHLORIDE 5 MG PO TABS: 5.0000 mg | ORAL_TABLET | Freq: Every evening | ORAL | Status: DC

## 2010-08-24 MED ORDER — SITAGLIP PHOS-METFORMIN HCL ER 50-500 MG PO TB24: 1.0000 | ORAL_TABLET | Freq: Two times a day (BID) | ORAL | Status: DC

## 2010-08-24 MED ORDER — MOMETASONE FUROATE 50 MCG/ACT NA SUSP: 2.0000 | Freq: Every day | NASAL | Status: DC

## 2010-08-24 NOTE — Assessment & Plan Note (Addendum)
A1c slightly worse. Pt has been taking metformin 750 mg bid which is contributing to chronic loose stools Change to janumet  If control continues to deteriorate,  Restart byetta  Lab Results  Component Value Date   HGBA1C 6.6* 08/17/2010

## 2010-08-24 NOTE — Progress Notes (Signed)
Subjective:    Patient ID: [name] [name], female    DOB: [birth year], 55 y.o.   MRN: [id number]  HPI  55 y/o female for follow up DM II - she returned from honeymoon Traveling made it difficult to stay on diabetic diet She stays active.  Htn - stable  Allergic rhinitis - much worse than usual this yr.  C/o nasal congestion , post nasal gtt, and fatigue   Past Medical History  Diagnosis Date  . Hypothyroidism   . Diabetes mellitus type II   . Fatty liver     abnormal transaminases; negative work up  . Atypical chest pain   . GERD (gastroesophageal reflux disease)   . Osteoarthritis     chronic, right knee  . Plantar fasciitis   . Chronic pain disorder     neck and shoulder    History   Social History  . Marital Status: Married    Spouse Name: N/A    Number of Children: 1  . Years of Education: N/A   Occupational History  . IT [employer]    Social History Main Topics  . Smoking status: Never Smoker   . Smokeless tobacco: Not on file  . Alcohol Use: No  . Drug Use:   . Sexually Active:    Other Topics Concern  . Not on file   Social History Narrative   Multiple family members with intolerance to statins.    Past Surgical History  Procedure Date  . Cholecystectomy     Family History  Problem Relation Age of Onset  . Alzheimer's disease Mother     Allergies  Allergen Reactions  . Epinephrine   . Simvastatin     REACTION: back ache    Current Outpatient Prescriptions on File Prior to Visit  Medication Sig Dispense Refill  . Ascorbic Acid (VITAMIN C) 500 MG tablet Take 500 mg by mouth daily.        Marland Kitchen aspirin 81 MG tablet Take 81 mg by mouth daily.        Marland Kitchen dexlansoprazole (DEXILANT) 60 MG capsule Take 60 mg by mouth daily.        . Glucosamine-Chondroitin (OSTEO BI-FLEX REGULAR STRENGTH) 250-200 MG TABS Take 1 tablet by mouth daily.        Marland Kitchen glucose blood (FREESTYLE LITE) test strip Use to test blood sugar once daily.       . hydrochlorothiazide  (,MICROZIDE/HYDRODIURIL,) 12.5 MG capsule Take 12.5 mg by mouth daily.        . Lancets (FREESTYLE) lancets Use to test blood sugar once a day.       . levothyroxine (SYNTHROID, LEVOTHROID) 88 MCG tablet Take 88 mcg by mouth daily.        . methocarbamol (ROBAXIN) 750 MG tablet Take 750 mg by mouth 2 (two) times daily.        Marland Kitchen DISCONTD: citalopram (CELEXA) 10 MG tablet Take 1/2 tablet by mouth daily.         BP 126/80  Pulse 95  Temp(Src) 98 F (36.7 C) (Oral)  Resp 18  Ht 5\' 6"  (1.676 m)  Wt 165 lb (74.844 kg)  BMI 26.63 kg/m2  SpO2 98%     Review of Systems     Objective:   Physical Exam  Constitutional: She appears well-developed and well-nourished.  HENT:  Head: Normocephalic and atraumatic.  Eyes: Conjunctivae are normal.  Cardiovascular: Normal rate and normal heart sounds.   Pulmonary/Chest: Effort normal and breath sounds normal.  She has no wheezes. She has no rales.  Neurological: No cranial nerve deficit.  Skin: Skin is warm.  Psychiatric: She has a normal mood and affect. Her behavior is normal.          Assessment & Plan:

## 2010-08-24 NOTE — Patient Instructions (Signed)
Please return for blood tests 2-3 days before your next office visit. BMET, A1c, Microalb / Cr.ration - 250.00 TSH - 244.9

## 2010-08-24 NOTE — Assessment & Plan Note (Signed)
Stable.  Continue current medication regimen. BP Readings from Last 3 Encounters:  08/24/10 126/80  05/17/10 108/60  04/13/10 130/82

## 2010-08-24 NOTE — Assessment & Plan Note (Signed)
Pt experiencing exacerbation Start xyzal 5 mg. Use nasonex as directed

## 2010-08-29 ENCOUNTER — Other Ambulatory Visit: Payer: Self-pay | Admitting: Internal Medicine

## 2010-08-29 DIAGNOSIS — R030 Elevated blood-pressure reading, without diagnosis of hypertension: Secondary | ICD-10-CM

## 2010-08-29 DIAGNOSIS — E119 Type 2 diabetes mellitus without complications: Secondary | ICD-10-CM

## 2010-08-29 DIAGNOSIS — E039 Hypothyroidism, unspecified: Secondary | ICD-10-CM

## 2010-09-14 ENCOUNTER — Other Ambulatory Visit: Payer: Self-pay | Admitting: *Deleted

## 2010-09-14 DIAGNOSIS — K219 Gastro-esophageal reflux disease without esophagitis: Secondary | ICD-10-CM

## 2010-09-14 MED ORDER — DEXLANSOPRAZOLE 60 MG PO CPDR: 60.0000 mg | DELAYED_RELEASE_CAPSULE | Freq: Every day | ORAL | Status: DC

## 2010-09-14 NOTE — Telephone Encounter (Signed)
Patient called and left voice message requesting a refill to [facility] order for Dexilant.  First Rx printed with Rob Bunting error, Rx reprinted to reflect 90 day supply with refills Rx faxed to [facility] [phone]

## 2010-09-17 ENCOUNTER — Telehealth: Payer: Self-pay | Admitting: Internal Medicine

## 2010-09-17 DIAGNOSIS — K219 Gastro-esophageal reflux disease without esophagitis: Secondary | ICD-10-CM

## 2010-09-17 MED ORDER — DEXLANSOPRAZOLE 60 MG PO CPDR: 60.0000 mg | DELAYED_RELEASE_CAPSULE | Freq: Every day | ORAL | Status: DC

## 2010-09-17 NOTE — Telephone Encounter (Signed)
Refill- dexilant dr 60mg  capsule. Take 1 capsule by mouth once a day. Qty 30.0 ea. Last fill 3.25.12

## 2010-09-17 NOTE — Telephone Encounter (Signed)
Spoke to pt and verified that she is only wanting a 10 day supply while she is waiting on her mail order shipment to arrive. [facility].

## 2010-10-05 ENCOUNTER — Other Ambulatory Visit: Payer: Self-pay | Admitting: Internal Medicine

## 2010-10-08 NOTE — Telephone Encounter (Signed)
RX Refill sent to pharmacy

## 2010-10-09 NOTE — Assessment & Plan Note (Signed)
[facility]                         GASTROENTEROLOGY OFFICE NOTE   NAME:[name] [name], [name] [name]                        MRN:          [id number]  DATE:06/02/2007                            DOB:          [birth year]    Mrs. [name] [name] is a 55 year old [name] [name] female referred through the courtesy  of Dr. Artist Pais for evaluation of atypical chest pain.   For the last three months, [name] [name] has had atypical stinging-like left  precordial chest pain radiating to her left arm and into her neck.  These episodes occur several times a day and last two minutes in  duration, and have no real precipitating or alleviating causes.  The  pain does not radiate to her back, is not associated with any  cardiovascular or pulmonary complaints.  They have not been precipitated  by hot or cold liquids or foods.  She has had no associated dysphagia,  and specifically denies any hepatobiliary complaints.  She is status  post cholecystectomy for dysfunctional gallbladder in August 2007.   The patient has seen Dr. Artist Pais and has had cardiac evaluation including  exercise testing which has been unremarkable.  Recent EKG, chest x-ray,  and screening laboratory parameter also has been unremarkable, except  for a slightly elevated SGPT of 38.  On reviewing her record, she has  had mildly abnormal liver function tests for the last year.  The patient  has been on Prilosec 20 mg twice a day without improvement.  She has no  lower GI complaints except for alternating diarrhea and constipation  which has been worse since her cholecystectomy.  She denies melena or  hematochezia.  She denies any specific food intolerances, and  specifically denies lactose intolerance.  She has had no anorexia or  weight loss.  She never had previous endoscopic exams of her gut.   PAST MEDICAL HISTORY:  The patient apparently over the last year has  developed adult-onset diabetes, hypercholesterolemia, hypothyroidism,  and  hypertension.  She was intolerant to statin medications because of  myalgias, but is on a variety of different medications for diabetic and  blood pressure control.  She has had no history of CVAs or MIs.   MEDICATIONS:  1. Synthroid 100 mcg a day.  2. Allegra 180 mg a day.  3. Osteo Bi-Flex daily.  4. Vitamin C 500 mg a day.  5. Multivitamin daily.  6. Aspirin 81 mg a day.  7. Omega fish oil twice a day.  8. Metformin ER 750 mg twice a day.  9. Actos 30 mg a day.  10.Omeprazole 20 mg twice an day.   She was on NSAIDs, which she discontinued because of dyspepsia.   As mentioned above, she has had myalgias with Crestor and Zocor.  She  otherwise denies drug allergies.   She has been diagnosed by Dr. Artist Pais as having fatty liver.   FAMILY HISTORY:  Noncontributory, except for diabetes in her mother.  There are no known gastrointestinal problems.   SOCIAL HISTORY:  She is divorced and lives with her boyfriend.  She has  some college education and works as  an Teaching laboratory technician.  She does not  smoke or use ethanol.   REVIEW OF SYSTEMS:  Noncontributory, except for some nonspecific night  sweats, chronic fatigue, shortness of breath with exertion, mild urinary  incontinence, and leg cramps at night.  She had a vaginal hysterectomy  in 1983, and has had breast implants.  She denies any current  psychiatric problems, and specifically denies anxiety syndrome or panic  attacks.  She has had no associated palpitations, and gives no other  cardiovascular history.   PHYSICAL EXAMINATION:  GENERAL:  She is a healthy-appearing [name] [name] female  in no distress, appearing her stated age.  VITAL SIGNS:  She is 5 feet 6 inches tall, and weighs 175 pounds.  Blood  pressure is 126/64, and pulse was 84 and regular.  HEENT:  I could not appreciate stigmata of chronic liver disease or  thyromegaly.  CHEST:  Entirely clear.  HEART:  She is in a regular rhythm, without murmurs, gallops, or rubs.  ABDOMEN:   There is no hepatosplenomegaly, abdominal masses, or  tenderness.  Bowel sounds are normal.  EXTREMITIES:  Peripheral extremities were unremarkable.  NEUROLOGIC:  Mental status was clear.  RECTAL:  Deferred.   ASSESSMENT:  1. Atypical chest pain.  Probable esophageal spasm.  2. Status post cholecystectomy for dysfunctional gallbladder.  3. Probable fatty infiltration of the liver, associated with metabolic      syndrome.  4. Adult-onset diabetes mellitus.  5. Treated hypothyroidism.  6. Treated essential hypertension.  7. Multiple drug sensitivities, including STATINS.  8. Irritable bowel syndrome, with alternating diarrhea and      constipation.  There is obviously sensitivity to fried and fatty      foods, and she may have an element of bile-salt enteropathy.  9. Status post hysterectomy.  10.History of restless leg syndrome.   RECOMMENDATIONS:  1. Outpatient endoscopy and colonoscopy.  2. Continue reflux regimen twice a day, Prilosec, until workup      completed.  3. P.r.n. sublingual Levsin 0.125 mg q.4-6 h.  4. Will review her records and consider repeat abdominal ultrasound if      this has not been done since her cholecystectomy.     Vania Rea. Jarold Motto, MD, Caleen Essex, FAGA  Electronically Signed    DRP/MedQ  DD: 06/02/2007  DT: 06/03/2007  Job #: [id number]   cc:   Barbette Hair. Artist Pais, DO

## 2010-10-09 NOTE — Assessment & Plan Note (Signed)
[facility]                                 ON-CALL NOTE   NAME:WHITE, SHARLYN ODONNEL                        MRN:          [id number]  DATE:04/27/2007                            DOB:          July 07, 1955    TIME OF INTERACTION:  5:21 p.m.   PHONE NUMBER:  [phone].   CALLER:  Selena Batten with [facility].   OBJECTIVE:  The patient had a stat lab done today, a D-dimer, which was  drawn and returns at 0.2, which is in the normal range.  Result was also  faxed to the department.   PRIMARY CARE Jabree Rebert:  Barbette Hair. Artist Pais, D.P.   HOME OFFICE:  Hope Budds, MD  Electronically Signed    RNS/MedQ  DD: 04/27/2007  DT: 04/28/2007  Job #: [id number]

## 2010-10-09 NOTE — Procedures (Signed)
[facility]                                PROCEDURE NOTE   NAME:WHITE, [name] [name]                        MRN:          [id number]  DATE:07/23/2007                            DOB:          Nov 14, 1955    [name] [name] is a 55 year old white female, who has atypical chest pain  with a negative GI workup, including endoscopy and esophageal biopsies  on June 05, 2007.  It was felt esophageal manometry and 24-hour pH  probe test were indicated to help delineate the cause of her pain.   MANOMETRY REPORT:  1. Upper esophageal sphincter:  There is normal coordination between      pharyngeal contraction and cricopharyngeal relaxation.  2. Lower esophageal sphincter:  Mean lower esophageal sphincter      pressure is 28 mmHg with normal relaxation to swallowing.  3. Motility pattern:  There are normally propagated peristaltic waves      throughout the length of the esophagus to both wet and dry      swallows.  Mean amplitude and contraction in the distal esophagus      is 137 mmHg with 100% peristalsis.   ASSESSMENT:  This is a normal esophageal manometry without any evidence  of an esophageal motility disorder.   24-HOUR PH PROBE TEST:  A 24-hour pH probe test was also completed with  a dual probe.  Total DeMeester score was 21, normal less than 22.  All  parameters showed no significant acid reflux, except in the distal  channel, in the recumbent position, where there was a percentage of time  where the pH was less than 4, with 3.5%, normal being less than 1.2%.  There was no significant postprandial or upright acid reflux noted.  Symptom analysis and symptom index was noncontributory.   ASSESSMENT:  [name] has a normal manometry, without evidence of  esophageal motility disorder and no lower esophageal sphincter  incompetency.  Her 24-hour pH probe test is fairly unremarkable, except  for some recumbent acid reflux, which should be managed with daily  PPI  therapy.  There is certainly no evidence of esophageal spasm or other  esophageal motility disorder.     [name] Rea. Jarold Motto, MD, Caleen Essex, FAGA  Electronically Signed    DRP/MedQ  DD: 07/31/2007  DT: 07/31/2007  Job #: [id number]   cc:   Barbette Hair. Artist Pais, DO

## 2010-10-12 NOTE — Op Note (Signed)
NAME:  [name], [name]                 ACCOUNT NO.:  [id number]   MEDICAL RECORD NO.:  [id number]            PATIENT TYPE:   LOCATION:                                 FACILITY:   PHYSICIAN:  Alfonse Ras, MD        DATE OF BIRTH:   DATE OF PROCEDURE:  05/02/2006  DATE OF DISCHARGE:                               OPERATIVE REPORT   PREOPERATIVE DIAGNOSIS:  Biliary dyskinesia.   POSTOPERATIVE DIAGNOSIS:  Biliary dyskinesia.   PROCEDURE:  Laparoscopic cholecystectomy with intraoperative  cholangiogram.   SURGEON:  Alfonse Ras, MD   ASSISTANT:  Gabrielle Dare. Janee Morn, M.D.   ANESTHESIA:  General.   DESCRIPTION:  The patient was taken to the operating room, placed in the  supine position after adequate anesthesia was induced using endotracheal  tube, the abdomen was prepped and draped in a normal sterile fashion.  Through a transverse infraumbilical incision, I dissected down to the  fascia.  Fascia was opened vertically.  A Vicryl pursestring suture was  placed around the fascial defect.  A Hasson trocar was placed; and  pneumoperitoneum was obtained. After an additional limb 11-mm trocar was  placed in the subxiphoid region, and 5 mm trocars were placed in right  abdomen; the gallbladder was identified and retracted cephalad.  It was  quite intrahepatic and difficult to visualize.   However, on dissecting the lateral and medial aspects along the  gallbladder mesentery the cystic duct, which appeared quite short, was  identified and a critical view was obtained.  It was clipped proximally  and a small dichotomy was made.  Cholangiogram was performed which  showed normal biliary anatomy except for a short cystic duct.  Cystic  duct was then clipped under direct vision and divided.  Cystic artery  was dissected in a similar fashion; triply clipped and divided.  Gallbladder was taken off the gallbladder bed using Bovie  electrocautery; however, there was a ductal __________ which  was  encountered and this was clipped along the liver bed.  The gallbladder  was then placed in an EndoCatch bag and sent for pathologic evaluation.  Right upper quadrant was copiously irrigated.  Adequate hemostasis was  assured.  Pneumoperitoneum was relieved.  The infraumbilical fascial  defect was closed with the #0 Vicryl pursestring suture.  Skin incisions  were closed with subcuticular 4-0 Monocryl.  Steri-Strips and sterile  dressings were applied.  The patient tolerated the procedure well went  to PACU in good condition.      Alfonse Ras, MD  Electronically Signed    KRE/MEDQ  D:  05/02/2006  T:  05/02/2006  Job:  [id number]

## 2010-10-12 NOTE — Assessment & Plan Note (Signed)
[facility]                           PRIMARY CARE OFFICE NOTE   NAME:[name] [name], [name] [name] [name]                        MRN:          [id number]  DATE:08/29/2006                            DOB:          [birth year]    CHIEF COMPLAINT:  New patient to practice.   HISTORY OF PRESENT ILLNESS:  The patient is a 55 year old, [name] [name] female  here to establish primary care. She was followed by Dr. Clarene [name] of [facility]. She was recently diagnosed with type 2 diabetes. This  occurred while she was undergoing gallbladder surgery in November 2007.  At the same time, they discovered she was hypothyroid. This has been  regulated with Synthroid. She was somewhat concerned regarding her  overall treatment for diabetes and also management of abnormal LFTs  presumed secondary to fatty liver. She has been followed with periodic  liver ultrasounds, her last one in February 2008. She was to return to  Dr. Clarene [name] for discussion of starting metformin and also Zocor for  hyperlipidemia.   Interestingly, she has had hemoglobin A1c's that have been relatively  normal at 6.4 but her fasting blood sugars have been ranging from 140s  to 170s in the morning. She did meet with a diabetic educator and has  significantly changed her diet.   She denies any history of coronary artery disease or history of stroke.   PAST MEDICAL HISTORY:  1. Type 2 diabetes.  2. Hyperlipidemia.  3. Recently diagnosed hypothyroidism.  4. Status post cholecystectomy in November 2007.  5. History of hysterectomy in 1982.  6. Bladder tack procedure in 1995.  7. Right knee surgery in 1998 and 2007.  8. Breast augmentation in 2002.   CURRENT MEDICATIONS:  1. Synthroid 100 mcg once a day.  2. Allegra 180 mg once a day.  3. Osteo Bi-Flex.  4. Multivitamin.  5. Vitamin E.  6. Vitamin D.   SOCIAL HISTORY:  She is divorced but currently engaged to be married.  She has a 83 year old grown son. She  works as an Garment/textile technologist.   FAMILY HISTORY:  Father deceased, medical history unknown. Mother is  alive at age 82. She has hypertension, diabetes, hyperlipidemia. She is  reported to have adverse reaction to Lipitor causing myositis.   HABITS:  She does not drink or smoke.   PREVENTIVE CARE HISTORY:  Her last mammogram in 2002. She has not had in  colon cancer screening.   REVIEW OF SYSTEMS:  The patient has frequent hot flashes. No HEENT  symptoms. Denies any chest pain or dyspnea on exertion. No heartburn,  nausea, vomiting, constipation or diarrhea. All other systems negative.   PHYSICAL EXAMINATION:  VITAL SIGNS:  Weight is 175 pounds, temperature  is 98.2, pulse is 109, blood pressure is 139/75 in the left arm in a  seated position.  GENERAL:  The patient is a pleasant, well-developed, well-nourished, 11-  year-old, [name] [name] female who appears her stated age.  HEENT:  Normocephalic, atraumatic. Pupils are equal and reactive to  light bilaterally. Extraocular motility was intact. The patient was  anicteric. Conjunctiva was within normal limits.  NECK:  Supple. No adenopathy, carotid bruit or thyromegaly.  CHEST:  Normal respiratory effort. Clear to auscultation bilaterally. No  rhonchi, rales or wheezing.  CARDIOVASCULAR:  Regular rate and rhythm. No significant murmurs, rubs  or gallops appreciated.  ABDOMEN:  Soft, nontender, positive bowel sounds, no organomegaly.  MUSCULOSKELETAL:  No clubbing, cyanosis or edema.  SKIN:  Warm and dry.  NEUROLOGIC:  Cranial nerves II-XII grossly intact. She was nonfocal.   IMPRESSION/RECOMMENDATIONS:  1. Type 2 diabetes, new onset.  2. History of fatty liver with abnormal liver function tests.  3. Hyperlipidemia.  4. Elevated blood pressure without diagnosis of hypertension.  5. Hypothyroidism.  6. Status post cholecystectomy.  7. Health maintenance.   RECOMMENDATIONS:  1. The patient will be started on  metformin today 500 mg. She is to      split the pill, take 1/2 a.c. b.i.d. for 7 days to be upwardly      titrated to 500 mg b.i.d. We will also add 10 mg of Simvastatin at      bedtime. Her LDL goal is less than 100. Her last LDL was reported      to 137 in February 2008. We will arrange for followup LFTs and also      CPK. She is to report any adverse effects.  2. We will screen her for hemachromatosis considering abnormal liver      function tests and      recent onset of type 2 diabetes. In addition, we will add hepatitis      screening. Followup time is in approximately 6 weeks.     Barbette Hair. Artist Pais, DO  Electronically Signed    RDY/MedQ  DD: 08/29/2006  DT: 08/29/2006  Job #: [id number]

## 2010-10-15 ENCOUNTER — Telehealth: Payer: Self-pay | Admitting: Internal Medicine

## 2010-10-15 DIAGNOSIS — E039 Hypothyroidism, unspecified: Secondary | ICD-10-CM

## 2010-10-15 NOTE — Telephone Encounter (Signed)
Pt states that she is out of levothyroixane and would like [place] to send refill to mail order rx through [employer]. Pt states that we should have that info on file. Pt states she did not have fax# to [place] with her.

## 2010-10-16 MED ORDER — LEVOTHYROXINE SODIUM 88 MCG PO TABS: 88.0000 ug | ORAL_TABLET | Freq: Every day | ORAL | Status: DC

## 2010-10-16 NOTE — Telephone Encounter (Signed)
Call placed to [facility] [address] [phone], spoke with Bard Herbert who transferred me to  [phone] to speak with pharmacy.  Spoke with Rodell Perna, Rx order provided a Rx for Synthroid 88 mcg once daily qty of 90 no refills.  Rx phoned into the pharmacist [facility] with [facility]

## 2010-11-30 ENCOUNTER — Ambulatory Visit: Payer: Managed Care, Other (non HMO) | Admitting: Internal Medicine

## 2010-12-18 ENCOUNTER — Other Ambulatory Visit: Payer: Self-pay | Admitting: Internal Medicine

## 2010-12-18 LAB — BASIC METABOLIC PANEL
CO2: 29 mEq/L (ref 19–32)
Calcium: 9.9 mg/dL (ref 8.4–10.5)
Potassium: 4.1 mEq/L (ref 3.5–5.3)
Sodium: 141 mEq/L (ref 135–145)

## 2010-12-19 LAB — HEMOGLOBIN A1C: Mean Plasma Glucose: 154 mg/dL — ABNORMAL HIGH (ref <117)

## 2010-12-19 LAB — MICROALBUMIN / CREATININE URINE RATIO: Microalb Creat Ratio: 7.6 mg/g (ref 0.0–30.0)

## 2010-12-24 ENCOUNTER — Ambulatory Visit: Payer: Managed Care, Other (non HMO) | Admitting: Internal Medicine

## 2010-12-24 ENCOUNTER — Encounter: Payer: Self-pay | Admitting: Internal Medicine

## 2010-12-24 ENCOUNTER — Ambulatory Visit (INDEPENDENT_AMBULATORY_CARE_PROVIDER_SITE_OTHER): Payer: BC Managed Care – PPO | Admitting: Internal Medicine

## 2010-12-24 DIAGNOSIS — E119 Type 2 diabetes mellitus without complications: Secondary | ICD-10-CM

## 2010-12-24 DIAGNOSIS — K219 Gastro-esophageal reflux disease without esophagitis: Secondary | ICD-10-CM

## 2010-12-24 DIAGNOSIS — E039 Hypothyroidism, unspecified: Secondary | ICD-10-CM

## 2010-12-24 MED ORDER — DEXLANSOPRAZOLE 60 MG PO CPDR: 60.0000 mg | DELAYED_RELEASE_CAPSULE | Freq: Every day | ORAL | Status: DC

## 2010-12-24 MED ORDER — GLIMEPIRIDE 2 MG PO TABS: 2.0000 mg | ORAL_TABLET | ORAL | Status: DC

## 2010-12-24 MED ORDER — HYDROCHLOROTHIAZIDE 12.5 MG PO CAPS: 12.5000 mg | ORAL_CAPSULE | ORAL | Status: DC

## 2010-12-24 MED ORDER — LEVOTHYROXINE SODIUM 88 MCG PO TABS: 88.0000 ug | ORAL_TABLET | Freq: Every day | ORAL | Status: DC

## 2010-12-24 MED ORDER — SITAGLIP PHOS-METFORMIN HCL ER 50-500 MG PO TB24: 1.0000 | ORAL_TABLET | Freq: Two times a day (BID) | ORAL | Status: DC

## 2010-12-24 NOTE — Patient Instructions (Signed)
Please schedule cbc, chem7, a1c (250.0) and lipid/lft (272.4) prior to next visit 

## 2010-12-24 NOTE — Progress Notes (Signed)
  Subjective:    Patient ID: [name] [name], female    DOB: 09-Jun-1955, 55 y.o.   MRN: [id number]  HPI Pt presents to clinic for evaluation of DM. Home fsbs's 118-148 without hypoglycemia. Last two A1c's increasing 6.6 and now 7.0. Last visit metformin dose decreased due to diarrhea which is improved. Tolerating januvia without adverse effect. Wt stable with tsh nl on stable dose of synthroid. BP reviewed as nl. No other complaints.  Reviewed pmh, medications and allergies.    Review of Systems see hpi    Objective:   Physical Exam  Nursing note and vitals reviewed. Constitutional: She appears well-developed and well-nourished. No distress.  HENT:  Head: Normocephalic and atraumatic.  Right Ear: External ear normal.  Left Ear: External ear normal.  Eyes: Conjunctivae are normal. No scleral icterus.  Neck: Neck supple. Carotid bruit is not present.  Cardiovascular: Normal rate, regular rhythm and normal heart sounds.  Exam reveals no gallop and no friction rub.   No murmur heard. Pulmonary/Chest: Effort normal and breath sounds normal. No respiratory distress. She has no wheezes. She has no rales.  Neurological: She is alert.  Skin: Skin is warm and dry. She is not diaphoretic.  Psychiatric: She has a normal mood and affect.          Assessment & Plan:   No problem-specific assessment & plan notes found for this encounter.

## 2010-12-24 NOTE — Assessment & Plan Note (Signed)
Mildly suboptimal control. Higher dose metformin led to diarrhea. Add low dose amaryl 2mg  po qd.

## 2010-12-24 NOTE — Assessment & Plan Note (Signed)
TSH stable. Continue current dose of synthroid.

## 2011-01-08 ENCOUNTER — Other Ambulatory Visit: Payer: Self-pay | Admitting: *Deleted

## 2011-01-08 NOTE — Telephone Encounter (Signed)
Patient called and left voice message requesting refill on Janumet and Levothyroxine to [facility]

## 2011-01-09 ENCOUNTER — Other Ambulatory Visit: Payer: Self-pay | Admitting: Internal Medicine

## 2011-01-09 NOTE — Telephone Encounter (Signed)
Rx for janumet  Refilled on 8/15 and  Rx for Levothyroxine refilled on 7/30.

## 2011-01-18 ENCOUNTER — Other Ambulatory Visit: Payer: Self-pay | Admitting: *Deleted

## 2011-01-18 DIAGNOSIS — E039 Hypothyroidism, unspecified: Secondary | ICD-10-CM

## 2011-01-18 MED ORDER — LEVOTHYROXINE SODIUM 88 MCG PO TABS: 88.0000 ug | ORAL_TABLET | Freq: Every day | ORAL | Status: DC

## 2011-01-18 NOTE — Telephone Encounter (Signed)
Call placed to patient at [phone], she was informed of refill to pharmacy.

## 2011-01-18 NOTE — Telephone Encounter (Signed)
Patient called and left voice message requesting refill on thyroid medication to [facility]

## 2011-01-29 ENCOUNTER — Other Ambulatory Visit: Payer: Self-pay | Admitting: *Deleted

## 2011-01-29 DIAGNOSIS — E039 Hypothyroidism, unspecified: Secondary | ICD-10-CM

## 2011-01-29 MED ORDER — LEVOTHYROXINE SODIUM 88 MCG PO TABS: 88.0000 ug | ORAL_TABLET | Freq: Every day | ORAL | Status: DC

## 2011-01-29 NOTE — Telephone Encounter (Signed)
Patient presented as walk in to office, to inquire about medication refills. She stated that she has contacted [facility], and they stated they did not received refills that were sent to office for her Levothyroxine. Patient states that she has been out of the medication for the past week, and is requesting a local Rx and Rx to mail order.    Call placed to [facility] [phone]. Bonita Quin, she was provided the medication refill for Synthroid 88  mcg qty 90 1 verified with pharmacist Cardell Peach.  Patient ID number [id number].

## 2011-02-26 LAB — DIFFERENTIAL
Basophils Absolute: 0
Basophils Relative: 0
Basophils Relative: 1
Eosinophils Absolute: 0
Eosinophils Absolute: 0
Eosinophils Relative: 0
Eosinophils Relative: 1
Monocytes Absolute: 0.6
Monocytes Absolute: 0.6
Monocytes Relative: 10
Monocytes Relative: 16 — ABNORMAL HIGH
Neutro Abs: 1.5 — ABNORMAL LOW

## 2011-02-26 LAB — COMPREHENSIVE METABOLIC PANEL
ALT: 72 — ABNORMAL HIGH
AST: 74 — ABNORMAL HIGH
AST: 90 — ABNORMAL HIGH
Albumin: 4.4
Albumin: 4.7
Alkaline Phosphatase: 82
BUN: 10
BUN: 8
Calcium: 9.4
Chloride: 95 — ABNORMAL LOW
Chloride: 98
Creatinine, Ser: 0.7
GFR calc Af Amer: >60
GFR calc non Af Amer: >60
Potassium: 3.1 — ABNORMAL LOW
Sodium: 143
Total Bilirubin: 0.6
Total Bilirubin: 1

## 2011-02-26 LAB — URINALYSIS, ROUTINE W REFLEX MICROSCOPIC
Bilirubin Urine: NEGATIVE
Hgb urine dipstick: NEGATIVE
Hgb urine dipstick: NEGATIVE
Ketones, ur: NEGATIVE
Nitrite: NEGATIVE
Protein, ur: 30 — AB
Protein, ur: NEGATIVE
Urobilinogen, UA: 0.2
Urobilinogen, UA: 1

## 2011-02-26 LAB — CBC
HCT: 41.1
HCT: 44.5
MCHC: 33.8
MCV: 86.2
Platelets: 187
Platelets: 188
RDW: 12.2
WBC: 4
WBC: 5.7

## 2011-02-26 LAB — POCT I-STAT 3, ART BLOOD GAS (G3+)
Bicarbonate: 28.9 — ABNORMAL HIGH
Patient temperature: 99
pH, Arterial: 7.462 — ABNORMAL HIGH
pO2, Arterial: 62 — ABNORMAL LOW

## 2011-02-26 LAB — URINE MICROSCOPIC-ADD ON

## 2011-02-26 LAB — GLUCOSE, CAPILLARY: Glucose-Capillary: 192 — ABNORMAL HIGH

## 2011-03-21 ENCOUNTER — Other Ambulatory Visit: Payer: Self-pay | Admitting: Internal Medicine

## 2011-03-21 DIAGNOSIS — E785 Hyperlipidemia, unspecified: Secondary | ICD-10-CM

## 2011-03-21 DIAGNOSIS — E119 Type 2 diabetes mellitus without complications: Secondary | ICD-10-CM

## 2011-03-21 LAB — CBC
MCH: 28.9 pg (ref 26.0–34.0)
MCHC: 33 g/dL (ref 30.0–36.0)
MCV: 87.4 fL (ref 78.0–100.0)
Platelets: 240 10*3/uL (ref 150–400)

## 2011-03-21 LAB — BASIC METABOLIC PANEL
CO2: 27 mEq/L (ref 19–32)
Calcium: 9.8 mg/dL (ref 8.4–10.5)
Creat: 0.79 mg/dL (ref 0.50–1.10)
Sodium: 139 mEq/L (ref 135–145)

## 2011-03-21 LAB — HEPATIC FUNCTION PANEL
Alkaline Phosphatase: 68 U/L (ref 39–117)
Bilirubin, Direct: 0.1 mg/dL (ref 0.0–0.3)
Indirect Bilirubin: 0.7 mg/dL (ref 0.0–0.9)
Total Protein: 7.6 g/dL (ref 6.0–8.3)

## 2011-03-21 LAB — LIPID PANEL
Cholesterol: 252 mg/dL — ABNORMAL HIGH (ref 0–200)
LDL Cholesterol: 149 mg/dL — ABNORMAL HIGH (ref 0–99)
Triglycerides: 238 mg/dL — ABNORMAL HIGH (ref <150)
VLDL: 48 mg/dL — ABNORMAL HIGH (ref 0–40)

## 2011-03-22 LAB — HEMOGLOBIN A1C: Hgb A1c MFr Bld: 5.9 % — ABNORMAL HIGH (ref <5.7)

## 2011-03-27 ENCOUNTER — Telehealth: Payer: Self-pay | Admitting: Internal Medicine

## 2011-03-27 ENCOUNTER — Encounter: Payer: Self-pay | Admitting: Internal Medicine

## 2011-03-27 ENCOUNTER — Ambulatory Visit (INDEPENDENT_AMBULATORY_CARE_PROVIDER_SITE_OTHER): Payer: BC Managed Care – PPO | Admitting: Internal Medicine

## 2011-03-27 VITALS — BP 120/80 | HR 84 | Temp 97.6°F | Resp 18 | Wt 169.0 lb

## 2011-03-27 DIAGNOSIS — E119 Type 2 diabetes mellitus without complications: Secondary | ICD-10-CM

## 2011-03-27 DIAGNOSIS — E039 Hypothyroidism, unspecified: Secondary | ICD-10-CM

## 2011-03-27 DIAGNOSIS — E785 Hyperlipidemia, unspecified: Secondary | ICD-10-CM

## 2011-03-27 DIAGNOSIS — I10 Essential (primary) hypertension: Secondary | ICD-10-CM

## 2011-03-27 DIAGNOSIS — Z23 Encounter for immunization: Secondary | ICD-10-CM

## 2011-03-27 DIAGNOSIS — K219 Gastro-esophageal reflux disease without esophagitis: Secondary | ICD-10-CM

## 2011-03-27 MED ORDER — DEXLANSOPRAZOLE 60 MG PO CPDR: 60.0000 mg | DELAYED_RELEASE_CAPSULE | Freq: Every day | ORAL | Status: DC

## 2011-03-27 NOTE — Patient Instructions (Signed)
Please schedule chem7, a1c, urine microalbumin 250.0, tsh, free t4 (hypothyroidism) and lipid (272.4) prior to next visit

## 2011-03-27 NOTE — Progress Notes (Signed)
  Subjective:    Patient ID: [name] [name], female    DOB: [birth year], 55 y.o.   MRN: [id number]  HPI Pt presents to clinic for followup of multiple medical problems. Blood sugar control improved without hypoglycemia. a1c improved to 5.9. Did lose her job and diet/exercise suffered as a result. Only able to tolerate crestor weekly due to side effects. ldl increased to 149. No feet numbness/tingling. No active complaint.  Past Medical History  Diagnosis Date  . Hypothyroidism   . Diabetes mellitus type II   . Fatty liver     abnormal transaminases; negative work up  . Atypical chest pain   . GERD (gastroesophageal reflux disease)   . Osteoarthritis     chronic, right knee  . Plantar fasciitis   . Chronic pain disorder     neck and shoulder   Past Surgical History  Procedure Date  . Cholecystectomy     reports that she has never smoked. She does not have any smokeless tobacco history on file. She reports that she does not drink alcohol. Her drug history not on file. family history includes Alzheimer's disease in her mother. Allergies  Allergen Reactions  . Epinephrine   . Simvastatin     REACTION: back ache     Review of Systems see hpi    Objective:   Physical Exam  Physical Exam  Nursing note and vitals reviewed. Constitutional: Appears well-developed and well-nourished. No distress.  HENT:  Head: Normocephalic and atraumatic.  Right Ear: External ear normal.  Left Ear: External ear normal.  Eyes: Conjunctivae are normal. No scleral icterus.  Neck: Neck supple. Carotid bruit is not present.  Cardiovascular: Normal rate, regular rhythm and normal heart sounds.  Exam reveals no gallop and no friction rub.   No murmur heard. Pulmonary/Chest: Effort normal and breath sounds normal. No respiratory distress. He has no wheezes. no rales.  Lymphadenopathy:    He has no cervical adenopathy.  Neurological:Alert.  Skin: Skin is warm and dry. Not diaphoretic.  Psychiatric:  Has a normal mood and affect.   Diabetic foot exam: +2 DP pulses, no diabetic wounds, ulcerations or significant callousing. Monofilament exam nl.       Assessment & Plan:

## 2011-03-27 NOTE — Assessment & Plan Note (Signed)
Improved control. Continue current regimen. Obtain chem7, a1c, urine microalbumin prior to next visit. Diabetic eye exam utd 6/12.

## 2011-03-27 NOTE — Assessment & Plan Note (Signed)
Worsened control. Continue crestor as tolerated. Focus of low fat diet and exercise. Repeat lipid profile prior to next visit

## 2011-03-27 NOTE — Telephone Encounter (Signed)
Lab orders entered for January 2013 in [facility]

## 2011-03-27 NOTE — Assessment & Plan Note (Signed)
Normotensive and stable. Continue current regimen. Monitor bp as outpt and followup in clinic as scheduled.  

## 2011-04-25 ENCOUNTER — Ambulatory Visit (INDEPENDENT_AMBULATORY_CARE_PROVIDER_SITE_OTHER): Payer: BC Managed Care – PPO | Admitting: Internal Medicine

## 2011-04-25 ENCOUNTER — Encounter: Payer: Self-pay | Admitting: Internal Medicine

## 2011-04-25 DIAGNOSIS — J45909 Unspecified asthma, uncomplicated: Secondary | ICD-10-CM

## 2011-04-25 MED ORDER — ALBUTEROL 90 MCG/ACT IN AERS: 2.0000 | INHALATION_SPRAY | Freq: Four times a day (QID) | RESPIRATORY_TRACT | Status: DC | PRN

## 2011-04-27 NOTE — Assessment & Plan Note (Signed)
Consider possible asthma. attempt albuterol mdi prn. Given advair sample for daily use. Rinse mouth after use. Consider pft's when back to baseline. Followup if no improvement or worsening.

## 2011-04-27 NOTE — Progress Notes (Signed)
  Subjective:    Patient ID: [name] [name], female    DOB: [birth year], 55 y.o.   MRN: [id number]  HPI Pt presents to clinic for evaluation of cough. Notes NP cough and dyspnea with cough over the last two weeks. Has not been sick with infxs sx's. Remodeling at home and also notes flares with exposure to certain scents. Attempting otc cold tablets without improvement. No other alleviating or exacerbating factors. No other complaints.  Past Medical History  Diagnosis Date  . Hypothyroidism   . Diabetes mellitus type II   . Fatty liver     abnormal transaminases; negative work up  . Atypical chest pain   . GERD (gastroesophageal reflux disease)   . Osteoarthritis     chronic, right knee  . Plantar fasciitis   . Chronic pain disorder     neck and shoulder   Past Surgical History  Procedure Date  . Cholecystectomy     reports that she has never smoked. She has never used smokeless tobacco. She reports that she does not drink alcohol. Her drug history not on file. family history includes Alzheimer's disease in her mother. Allergies  Allergen Reactions  . Epinephrine   . Simvastatin     REACTION: back ache      Review of Systems see hpi     Objective:   Physical Exam  Nursing note and vitals reviewed. Constitutional: She appears well-developed and well-nourished. No distress.  HENT:  Head: Normocephalic and atraumatic.  Right Ear: External ear normal.  Left Ear: External ear normal.  Nose: Nose normal.  Mouth/Throat: Oropharynx is clear and moist. No oropharyngeal exudate.  Eyes: Conjunctivae are normal. No scleral icterus.  Neck: Neck supple.  Cardiovascular: Normal rate, regular rhythm and normal heart sounds.   Pulmonary/Chest: Effort normal and breath sounds normal. No respiratory distress. She has no wheezes. She has no rales.  Neurological: She is alert.  Skin: Skin is warm and dry. She is not diaphoretic.  Psychiatric: She has a normal mood and affect.          Assessment & Plan:

## 2011-05-01 ENCOUNTER — Encounter: Payer: Self-pay | Admitting: Internal Medicine

## 2011-05-02 ENCOUNTER — Other Ambulatory Visit: Payer: Self-pay | Admitting: *Deleted

## 2011-05-02 MED ORDER — GLUCOSE BLOOD VI STRP: ORAL_STRIP | Status: DC

## 2011-05-02 NOTE — Telephone Encounter (Signed)
Patient sent refill request for test strips via mychart message. [facility]

## 2011-05-13 ENCOUNTER — Telehealth: Payer: Self-pay | Admitting: *Deleted

## 2011-05-13 MED ORDER — ONETOUCH DELICA LANCETS MISC: Status: DC

## 2011-05-13 MED ORDER — ONETOUCH ULTRA 2 W/DEVICE KIT: 1.0000 | PACK | Freq: Once | Status: DC

## 2011-05-13 MED ORDER — GLUCOSE BLOOD VI STRP: ORAL_STRIP | Status: DC

## 2011-05-13 NOTE — Telephone Encounter (Signed)
Fax received from [facility] stating [employer] products are not covered by patients insurance. A request for One Touch Ultra testing supplies be submitted in place of [employer].  One Touch testing supplies submitted to [facility].

## 2011-05-18 ENCOUNTER — Other Ambulatory Visit: Payer: Self-pay | Admitting: Internal Medicine

## 2011-05-20 NOTE — Telephone Encounter (Signed)
Rx refill sent to pharmacy. 

## 2011-06-10 ENCOUNTER — Telehealth: Payer: Self-pay | Admitting: Internal Medicine

## 2011-06-10 MED ORDER — SITAGLIPTIN PHOS-METFORMIN HCL 50-500 MG PO TABS: 1.0000 | ORAL_TABLET | Freq: Two times a day (BID) | ORAL | Status: DC

## 2011-06-10 NOTE — Telephone Encounter (Signed)
Rx refill sent to pharmacy. 

## 2011-06-13 ENCOUNTER — Other Ambulatory Visit: Payer: Self-pay | Admitting: *Deleted

## 2011-06-13 MED ORDER — GLIMEPIRIDE 2 MG PO TABS: 2.0000 mg | ORAL_TABLET | ORAL | Status: DC

## 2011-06-13 NOTE — Telephone Encounter (Signed)
Rx refill sent to pharmacy. 

## 2011-06-17 ENCOUNTER — Telehealth: Payer: Self-pay | Admitting: Internal Medicine

## 2011-06-17 NOTE — Telephone Encounter (Signed)
Possible asthma. Avoidance of triggers like chemicals and smells that worsen breathing. i told her that after she hopefully got over her episode she may benefit from pulmonary function tests but would need to be when she is back to her "norm"

## 2011-06-17 NOTE — Telephone Encounter (Signed)
Call placed to pharmacy at [phone], verified with pharmacy Rx on file for qty of 60 with refills. Please advise on Asthma.

## 2011-06-17 NOTE — Telephone Encounter (Signed)
She asked for a refill of her janumet 500 mg she takes 2 a day.  [facility] [place]  Her insurance will charge her the same for a 90 day as they will for a 30 day.  She would like a 90 day rx  She was diagnosed with asthma on her last visit.  She would like some additional information as to long term effects and how to manage it.

## 2011-06-18 ENCOUNTER — Encounter: Payer: Self-pay | Admitting: Internal Medicine

## 2011-06-18 ENCOUNTER — Ambulatory Visit (HOSPITAL_BASED_OUTPATIENT_CLINIC_OR_DEPARTMENT_OTHER)
Admission: RE | Admit: 2011-06-18 | Discharge: 2011-06-18 | Disposition: A | Discharge status: Home / Self Care | Payer: BC Managed Care – PPO | Source: Ambulatory Visit | Attending: Internal Medicine | Admitting: Internal Medicine

## 2011-06-18 ENCOUNTER — Ambulatory Visit (INDEPENDENT_AMBULATORY_CARE_PROVIDER_SITE_OTHER): Payer: BC Managed Care – PPO | Admitting: Internal Medicine

## 2011-06-18 VITALS — BP 124/80 | HR 103 | Temp 97.5°F | Resp 20 | Ht 66.0 in | Wt 173.0 lb

## 2011-06-18 DIAGNOSIS — R0609 Other forms of dyspnea: Secondary | ICD-10-CM

## 2011-06-18 DIAGNOSIS — J45909 Unspecified asthma, uncomplicated: Secondary | ICD-10-CM

## 2011-06-18 DIAGNOSIS — R059 Cough, unspecified: Secondary | ICD-10-CM | POA: Insufficient documentation

## 2011-06-18 DIAGNOSIS — R05 Cough: Secondary | ICD-10-CM

## 2011-06-18 DIAGNOSIS — R062 Wheezing: Secondary | ICD-10-CM | POA: Insufficient documentation

## 2011-06-18 DIAGNOSIS — R0600 Dyspnea, unspecified: Secondary | ICD-10-CM

## 2011-06-18 LAB — PULMONARY FUNCTION TEST

## 2011-06-18 MED ORDER — FLUTICASONE-SALMETEROL 250-50 MCG/DOSE IN AEPB: 1.0000 | INHALATION_SPRAY | Freq: Two times a day (BID) | RESPIRATORY_TRACT | Status: DC

## 2011-06-18 NOTE — Progress Notes (Signed)
  Subjective:    Patient ID: [name] [name], female    DOB: 1956/01/27, 56 y.o.   MRN: [id number]  HPI Pt presents to clinic for evaluation of cough and wheezing. Last seen and begun on sample of advair. Tolerated without adverse effect and denies thrush. Noted resolution of cough and wheezing with medication but sx's returned when medication ran out. Albuterol mdi not helpful. No past PFT or recent CXR. Avoiding scents and fragrances that exacerbate/trigger sx's. Cough is not productive and not associated with f/c. No other alleviating or exacerbating factors. No other complaints.  Past Medical History  Diagnosis Date  . Hypothyroidism   . Diabetes mellitus type II   . Fatty liver     abnormal transaminases; negative work up  . Atypical chest pain   . GERD (gastroesophageal reflux disease)   . Osteoarthritis     chronic, right knee  . Plantar fasciitis   . Chronic pain disorder     neck and shoulder   Past Surgical History  Procedure Date  . Cholecystectomy     reports that she has never smoked. She has never used smokeless tobacco. She reports that she does not drink alcohol. Her drug history not on file. family history includes Alzheimer's disease in her mother. Allergies  Allergen Reactions  . Epinephrine   . Simvastatin     REACTION: back ache     Review of Systems see hpi     Objective:   Physical Exam  Constitutional: She appears well-developed and well-nourished.  HENT:  Head: Normocephalic and atraumatic.  Eyes: Conjunctivae are normal. No scleral icterus.  Neck: Neck supple.  Cardiovascular: Normal rate, regular rhythm and normal heart sounds.  Exam reveals no gallop and no friction rub.   No murmur heard. Pulmonary/Chest: Effort normal and breath sounds normal. No respiratory distress. She has no wheezes. She has no rales.  Neurological: She is alert.  Skin: Skin is warm and dry.  Psychiatric: She has a normal mood and affect.          Assessment &  Plan:

## 2011-06-18 NOTE — Assessment & Plan Note (Signed)
Suspect asthma. Obtain cxr pa/lat. Sample and prescription provided for advair 250. Rinse mouth after use. Schedule PFT.

## 2011-06-18 NOTE — Telephone Encounter (Signed)
Call placed to patient [phone], she stated she is not feeling any better, and has scheduled for 10:30 this morning 06/18/2011.

## 2011-06-18 NOTE — Progress Notes (Signed)
PFT was done today.  

## 2011-06-18 NOTE — Patient Instructions (Signed)
Please perform your chest xray today and begin scheduling the pulmonary function test before your next appointment

## 2011-06-19 ENCOUNTER — Other Ambulatory Visit: Payer: Self-pay | Admitting: *Deleted

## 2011-06-19 ENCOUNTER — Encounter: Payer: Self-pay | Admitting: Internal Medicine

## 2011-06-19 MED ORDER — SITAGLIPTIN PHOS-METFORMIN HCL 50-500 MG PO TABS: 1.0000 | ORAL_TABLET | Freq: Two times a day (BID) | ORAL | Status: DC

## 2011-06-19 NOTE — Telephone Encounter (Signed)
Rx refill sent to pharmacy. 

## 2011-06-20 ENCOUNTER — Other Ambulatory Visit: Payer: Self-pay | Admitting: *Deleted

## 2011-06-20 DIAGNOSIS — E119 Type 2 diabetes mellitus without complications: Secondary | ICD-10-CM

## 2011-06-20 DIAGNOSIS — E785 Hyperlipidemia, unspecified: Secondary | ICD-10-CM

## 2011-06-20 DIAGNOSIS — E039 Hypothyroidism, unspecified: Secondary | ICD-10-CM

## 2011-06-20 LAB — BASIC METABOLIC PANEL
BUN: 12 mg/dL (ref 6–23)
Chloride: 105 mEq/L (ref 96–112)
Creat: 0.72 mg/dL (ref 0.50–1.10)
Glucose, Bld: 115 mg/dL — ABNORMAL HIGH (ref 70–99)
Potassium: 4.6 mEq/L (ref 3.5–5.3)

## 2011-06-20 LAB — LIPID PANEL
LDL Cholesterol: 112 mg/dL — ABNORMAL HIGH (ref 0–99)
Total CHOL/HDL Ratio: 4.4 Ratio
Triglycerides: 219 mg/dL — ABNORMAL HIGH (ref <150)
VLDL: 44 mg/dL — ABNORMAL HIGH (ref 0–40)

## 2011-06-21 LAB — MICROALBUMIN / CREATININE URINE RATIO
Creatinine, Urine: 392.8 mg/dL
Microalb Creat Ratio: 3.1 mg/g (ref 0.0–30.0)

## 2011-06-25 ENCOUNTER — Encounter: Payer: Self-pay | Admitting: Internal Medicine

## 2011-06-26 ENCOUNTER — Ambulatory Visit (INDEPENDENT_AMBULATORY_CARE_PROVIDER_SITE_OTHER): Payer: BC Managed Care – PPO | Admitting: Internal Medicine

## 2011-06-26 ENCOUNTER — Encounter: Payer: Self-pay | Admitting: Internal Medicine

## 2011-06-26 ENCOUNTER — Telehealth: Payer: Self-pay | Admitting: Internal Medicine

## 2011-06-26 DIAGNOSIS — E785 Hyperlipidemia, unspecified: Secondary | ICD-10-CM

## 2011-06-26 DIAGNOSIS — J45909 Unspecified asthma, uncomplicated: Secondary | ICD-10-CM

## 2011-06-26 DIAGNOSIS — E119 Type 2 diabetes mellitus without complications: Secondary | ICD-10-CM

## 2011-06-26 NOTE — Patient Instructions (Signed)
Please schedule lipid/lft 272.4 and chem7, a1c 250.0 prior to next visit 

## 2011-06-26 NOTE — Telephone Encounter (Signed)
Lab orders entered for April 2013. 

## 2011-07-05 NOTE — Assessment & Plan Note (Signed)
Current good control. Continue current regimen. 

## 2011-07-05 NOTE — Progress Notes (Signed)
  Subjective:    Patient ID: [name] [name], female    DOB: [birth year], 56 y.o.   MRN: [id number]  HPI Pt presents to clinic for followup of multiple medical problems.   Past Medical History  Diagnosis Date  . Hypothyroidism   . Diabetes mellitus type II   . Fatty liver     abnormal transaminases; negative work up  . Atypical chest pain   . GERD (gastroesophageal reflux disease)   . Osteoarthritis     chronic, right knee  . Plantar fasciitis   . Chronic pain disorder     neck and shoulder   Past Surgical History  Procedure Date  . Cholecystectomy     reports that she has never smoked. She has never used smokeless tobacco. She reports that she does not drink alcohol. Her drug history not on file. family history includes Alzheimer's disease in her mother. Allergies  Allergen Reactions  . Epinephrine   . Simvastatin     REACTION: back ache      Review of Systems see hpi     Objective:   Physical Exam  Nursing note and vitals reviewed. Constitutional: She appears well-developed and well-nourished. No distress.  HENT:  Head: Normocephalic and atraumatic.  Skin: She is not diaphoretic.          Assessment & Plan:

## 2011-07-05 NOTE — Assessment & Plan Note (Signed)
Can only tolerates statin dosing 1x/week

## 2011-07-05 NOTE — Assessment & Plan Note (Signed)
PFT shows no obstructive pattern. Improved with empiric tx. Continue advair- aware of need for rinsing mouth after use. Consider pulmonary consult if sx's worsen

## 2011-07-24 ENCOUNTER — Ambulatory Visit (INDEPENDENT_AMBULATORY_CARE_PROVIDER_SITE_OTHER): Payer: BC Managed Care – PPO | Admitting: Family

## 2011-07-24 ENCOUNTER — Encounter: Payer: Self-pay | Admitting: Family

## 2011-07-24 DIAGNOSIS — J029 Acute pharyngitis, unspecified: Secondary | ICD-10-CM

## 2011-07-24 DIAGNOSIS — J020 Streptococcal pharyngitis: Secondary | ICD-10-CM

## 2011-07-24 DIAGNOSIS — R509 Fever, unspecified: Secondary | ICD-10-CM

## 2011-07-24 LAB — POCT INFLUENZA A/B
Influenza A, POC: NEGATIVE
Influenza B, POC: NEGATIVE

## 2011-07-24 MED ORDER — CEFDINIR 300 MG PO CAPS: 300.0000 mg | ORAL_CAPSULE | Freq: Two times a day (BID) | ORAL | Status: AC

## 2011-07-24 NOTE — Assessment & Plan Note (Signed)
Rapid strep +.  Plan to treat with cefdinir.  She is instructed to call if symptoms worsen, or if not feeling better in 2-3 days.

## 2011-07-24 NOTE — Progress Notes (Signed)
Subjective:    Patient ID: [name] [name], female    DOB: [birth year], 56 y.o.   MRN: [id number]  HPI  [name] [name] is a 56 yr old female who presents today wit hchief complaint of chills, shivering, fever.  Starting to get sore throat yesterday, swollen glands.  "Major myalgias" which is alleviated by motrin.  She reports hx of asthma, but says that this is actually ok.  She denies coughing.     Review of Systems See HPI  Past Medical History  Diagnosis Date  . Hypothyroidism   . Diabetes mellitus type II   . Fatty liver     abnormal transaminases; negative work up  . Atypical chest pain   . GERD (gastroesophageal reflux disease)   . Osteoarthritis     chronic, right knee  . Plantar fasciitis   . Chronic pain disorder     neck and shoulder    History   Social History  . Marital Status: Married    Spouse Name: N/A    Number of Children: 1  . Years of Education: N/A   Occupational History  . IT [employer]    Social History Main Topics  . Smoking status: Never Smoker   . Smokeless tobacco: Never Used  . Alcohol Use: No  . Drug Use: Not on file  . Sexually Active: Not on file   Other Topics Concern  . Not on file   Social History Narrative   Multiple family members with intolerance to statins.    Past Surgical History  Procedure Date  . Cholecystectomy     Family History  Problem Relation Age of Onset  . Alzheimer's disease Mother     Allergies  Allergen Reactions  . Epinephrine   . Simvastatin     REACTION: back ache    Current Outpatient Prescriptions on File Prior to Visit  Medication Sig Dispense Refill  . albuterol (PROVENTIL,VENTOLIN) 90 MCG/ACT inhaler Inhale 2 puffs into the lungs every 6 (six) hours as needed for wheezing or shortness of breath.  17 g  4  . Ascorbic Acid (VITAMIN C) 500 MG tablet Take 500 mg by mouth daily.        Marland Kitchen aspirin 81 MG tablet Take 81 mg by mouth daily.        . Blood Glucose Monitoring Suppl (ONE TOUCH ULTRA 2)  W/DEVICE KIT 1 Device by Does not apply route once.  1 each  0  . dexlansoprazole (DEXILANT) 60 MG capsule Take 1 capsule (60 mg total) by mouth daily.  90 capsule  3  . Fluticasone-Salmeterol (ADVAIR DISKUS) 250-50 MCG/DOSE AEPB Inhale 1 puff into the lungs 2 (two) times daily.  1 each  3  . glimepiride (AMARYL) 2 MG tablet Take 1 tablet (2 mg total) by mouth every morning.  90 tablet  1  . Glucosamine-Chondroitin (OSTEO BI-FLEX REGULAR STRENGTH) 250-200 MG TABS Take 1 tablet by mouth 2 (two) times daily.       Marland Kitchen levothyroxine (SYNTHROID, LEVOTHROID) 88 MCG tablet Take 1 tablet (88 mcg total) by mouth daily.  90 tablet  1  . ONETOUCH DELICA LANCETS MISC Use to check blood sugar once a day  100 each  3  . rosuvastatin (CRESTOR) 5 MG tablet Take 5 mg by mouth daily. Take 5 mg by mouth once a week.  Crestor 10 mg  Lot [id number] EXP 01/15 X 3 provided to patient at office visit 04/25/2011.       . sitaGLIPtan-metformin (  JANUMET) 50-500 MG per tablet Take 1 tablet by mouth 2 (two) times daily with a meal.  180 tablet  1  . glucose blood (ONE TOUCH ULTRA TEST) test strip Use to check blood sugars once a day  100 each  3  . hydrochlorothiazide (,MICROZIDE/HYDRODIURIL,) 12.5 MG capsule Take 1 capsule (12.5 mg total) by mouth every morning.  90 capsule  1  . levocetirizine (XYZAL) 5 MG tablet Take 1 tablet (5 mg total) by mouth every evening.  90 tablet  1    BP 130/80  Pulse 133  Temp(Src) 99.8 F (37.7 C) (Oral)  Resp 18  Wt 169 lb 1.3 oz (76.694 kg)  SpO2 96%       Objective:   Physical Exam  Constitutional: She appears well-developed and well-nourished. No distress.  HENT:  Head: Normocephalic and atraumatic.  Right Ear: A middle ear effusion is present.  Left Ear: Tympanic membrane and ear canal normal.  Mouth/Throat: Posterior oropharyngeal edema and posterior oropharyngeal erythema present. No oropharyngeal exudate or tonsillar abscesses.  Eyes: Conjunctivae are normal. Pupils are equal,  round, and reactive to light.  Cardiovascular: Normal rate and regular rhythm.   No murmur heard. Pulmonary/Chest: Effort normal and breath sounds normal. No respiratory distress. She has no wheezes. She has no rales. She exhibits no tenderness.       She appears mildly short of breath while seated on the table.   Lymphadenopathy:    She has cervical adenopathy.  Skin:       Warm to touch.  Psychiatric: She has a normal mood and affect. Her behavior is normal. Judgment and thought content normal.          Assessment & Plan:

## 2011-07-24 NOTE — Patient Instructions (Signed)
Please drink at least 8 glasses of water a day.  Take motrin 400mg  every 6 hours until muscle pain/fever resolved. Call if symptoms worsen, or if you are not feeling better in 2-3 days.

## 2011-07-31 ENCOUNTER — Telehealth: Payer: Self-pay | Admitting: Internal Medicine

## 2011-07-31 MED ORDER — HYDROCHLOROTHIAZIDE 12.5 MG PO CAPS: 12.5000 mg | ORAL_CAPSULE | ORAL | Status: DC

## 2011-07-31 NOTE — Telephone Encounter (Signed)
Rx refill sent to pharmacy. 

## 2011-07-31 NOTE — Telephone Encounter (Signed)
New prescription for hydrochlorot CAP 12.5mg 

## 2011-08-12 ENCOUNTER — Telehealth: Payer: Self-pay | Admitting: Internal Medicine

## 2011-08-12 DIAGNOSIS — E039 Hypothyroidism, unspecified: Secondary | ICD-10-CM

## 2011-08-12 MED ORDER — LEVOTHYROXINE SODIUM 88 MCG PO TABS: 88.0000 ug | ORAL_TABLET | Freq: Every day | ORAL | Status: DC

## 2011-08-12 NOTE — Telephone Encounter (Signed)
New prescription request  Synthroid 0.088mg  tab.

## 2011-08-12 NOTE — Telephone Encounter (Signed)
Rx refill sent to pharmacy. 

## 2011-09-12 ENCOUNTER — Telehealth: Payer: Self-pay | Admitting: Internal Medicine

## 2011-09-12 DIAGNOSIS — E785 Hyperlipidemia, unspecified: Secondary | ICD-10-CM

## 2011-09-12 DIAGNOSIS — E119 Type 2 diabetes mellitus without complications: Secondary | ICD-10-CM

## 2011-09-12 NOTE — Telephone Encounter (Signed)
Lab orders entered for [name] [name]. 

## 2011-09-13 ENCOUNTER — Other Ambulatory Visit (INDEPENDENT_AMBULATORY_CARE_PROVIDER_SITE_OTHER): Payer: BC Managed Care – PPO

## 2011-09-13 DIAGNOSIS — E119 Type 2 diabetes mellitus without complications: Secondary | ICD-10-CM

## 2011-09-13 DIAGNOSIS — E785 Hyperlipidemia, unspecified: Secondary | ICD-10-CM

## 2011-09-13 LAB — BASIC METABOLIC PANEL
Chloride: 101 mEq/L (ref 96–112)
Creatinine, Ser: 0.7 mg/dL (ref 0.4–1.2)
Potassium: 3.7 mEq/L (ref 3.5–5.1)

## 2011-09-13 LAB — HEPATIC FUNCTION PANEL
Albumin: 4.2 g/dL (ref 3.5–5.2)
Total Protein: 7.3 g/dL (ref 6.0–8.3)

## 2011-09-13 LAB — LIPID PANEL
Cholesterol: 191 mg/dL (ref 0–200)
LDL Cholesterol: 112 mg/dL — ABNORMAL HIGH (ref 0–99)
Triglycerides: 147 mg/dL (ref 0.0–149.0)
VLDL: 29.4 mg/dL (ref 0.0–40.0)

## 2011-09-13 LAB — HEMOGLOBIN A1C: Hgb A1c MFr Bld: 6.2 % (ref 4.6–6.5)

## 2011-09-18 ENCOUNTER — Encounter: Payer: Self-pay | Admitting: Internal Medicine

## 2011-09-18 ENCOUNTER — Ambulatory Visit (INDEPENDENT_AMBULATORY_CARE_PROVIDER_SITE_OTHER): Payer: BC Managed Care – PPO | Admitting: Internal Medicine

## 2011-09-18 VITALS — BP 108/64 | HR 93 | Temp 98.0°F | Ht 66.0 in | Wt 170.0 lb

## 2011-09-18 DIAGNOSIS — I10 Essential (primary) hypertension: Secondary | ICD-10-CM

## 2011-09-18 DIAGNOSIS — Z23 Encounter for immunization: Secondary | ICD-10-CM

## 2011-09-18 DIAGNOSIS — E119 Type 2 diabetes mellitus without complications: Secondary | ICD-10-CM

## 2011-09-18 DIAGNOSIS — E785 Hyperlipidemia, unspecified: Secondary | ICD-10-CM

## 2011-09-18 NOTE — Patient Instructions (Signed)
Please schedule cbc, chem7, a1c  250.0 prior to next visit 

## 2011-09-18 NOTE — Assessment & Plan Note (Signed)
Good control. Continue current regimen. Obtain chem7, a1c prior to next visit 

## 2011-09-18 NOTE — Assessment & Plan Note (Signed)
Improving control. ldl remains above goal but cannot tolerate daily statin. Focus on low fat diet, exercise and wt loss.

## 2011-09-18 NOTE — Progress Notes (Signed)
Addended by: Glendell Docker on: 09/18/2011 09:03 AM   Modules accepted: Orders

## 2011-09-18 NOTE — Progress Notes (Signed)
  Subjective:    Patient ID: [name] [name], female    DOB: 05-09-1956, 56 y.o.   MRN: [id number]  HPI Pt presents to clinic for followup of multiple medical problems. States asthma well controlled with advair. Tolerating crestor 1x/week. LDL mildly suboptimal but other values normalized. Occasional hypoglycemia sx's with fsbs low 100's but no true hypoglycemia  Past Medical History  Diagnosis Date  . Hypothyroidism   . Diabetes mellitus type II   . Fatty liver     abnormal transaminases; negative work up  . Atypical chest pain   . GERD (gastroesophageal reflux disease)   . Osteoarthritis     chronic, right knee  . Plantar fasciitis   . Chronic pain disorder     neck and shoulder   Past Surgical History  Procedure Date  . Cholecystectomy     reports that she has never smoked. She has never used smokeless tobacco. She reports that she does not drink alcohol. Her drug history not on file. family history includes Alzheimer's disease in her mother. Allergies  Allergen Reactions  . Epinephrine   . Simvastatin     REACTION: back ache      Review of Systems see hpi     Objective:   Physical Exam  Physical Exam  Nursing note and vitals reviewed. Constitutional: Appears well-developed and well-nourished. No distress.  HENT:  Head: Normocephalic and atraumatic.  Right Ear: External ear normal.  Left Ear: External ear normal.  Eyes: Conjunctivae are normal. No scleral icterus.  Neck: Neck supple. Carotid bruit is not present.  Cardiovascular: Normal rate, regular rhythm and normal heart sounds.  Exam reveals no gallop and no friction rub.   No murmur heard. Pulmonary/Chest: Effort normal and breath sounds normal. No respiratory distress. He has no wheezes. no rales.  Lymphadenopathy:    He has no cervical adenopathy.  Neurological:Alert.  Skin: Skin is warm and dry. Not diaphoretic.  Psychiatric: Has a normal mood and affect.  Diabetic foot exam: +2 DP pulses, no diabetic  wounds, ulcerations or significant callousing. Monofilament exam nl.       Assessment & Plan:

## 2011-09-18 NOTE — Assessment & Plan Note (Signed)
Normotensive and stable. Continue current regimen. Monitor bp as outpt and followup in clinic as scheduled.  

## 2011-11-14 ENCOUNTER — Other Ambulatory Visit: Payer: Self-pay | Admitting: Internal Medicine

## 2011-11-14 DIAGNOSIS — Z1231 Encounter for screening mammogram for malignant neoplasm of breast: Secondary | ICD-10-CM

## 2011-11-20 ENCOUNTER — Inpatient Hospital Stay (HOSPITAL_COMMUNITY)
Admission: EM | Admit: 2011-11-20 | Discharge: 2011-11-21 | DRG: 125 | Disposition: A | Discharge status: Home / Self Care | Payer: BC Managed Care – PPO | Attending: Cardiology | Admitting: Cardiology

## 2011-11-20 ENCOUNTER — Emergency Department (HOSPITAL_COMMUNITY): Payer: BC Managed Care – PPO

## 2011-11-20 ENCOUNTER — Encounter (HOSPITAL_COMMUNITY): Payer: Self-pay | Admitting: *Deleted

## 2011-11-20 DIAGNOSIS — G894 Chronic pain syndrome: Secondary | ICD-10-CM | POA: Diagnosis present

## 2011-11-20 DIAGNOSIS — E119 Type 2 diabetes mellitus without complications: Secondary | ICD-10-CM | POA: Diagnosis present

## 2011-11-20 DIAGNOSIS — Z79899 Other long term (current) drug therapy: Secondary | ICD-10-CM

## 2011-11-20 DIAGNOSIS — E785 Hyperlipidemia, unspecified: Secondary | ICD-10-CM | POA: Diagnosis present

## 2011-11-20 DIAGNOSIS — I10 Essential (primary) hypertension: Secondary | ICD-10-CM | POA: Diagnosis present

## 2011-11-20 DIAGNOSIS — R0789 Other chest pain: Principal | ICD-10-CM | POA: Diagnosis present

## 2011-11-20 DIAGNOSIS — K7689 Other specified diseases of liver: Secondary | ICD-10-CM | POA: Diagnosis present

## 2011-11-20 DIAGNOSIS — M1990 Unspecified osteoarthritis, unspecified site: Secondary | ICD-10-CM | POA: Diagnosis present

## 2011-11-20 DIAGNOSIS — K219 Gastro-esophageal reflux disease without esophagitis: Secondary | ICD-10-CM

## 2011-11-20 DIAGNOSIS — R079 Chest pain, unspecified: Secondary | ICD-10-CM

## 2011-11-20 DIAGNOSIS — Z7982 Long term (current) use of aspirin: Secondary | ICD-10-CM

## 2011-11-20 DIAGNOSIS — E039 Hypothyroidism, unspecified: Secondary | ICD-10-CM | POA: Diagnosis present

## 2011-11-20 DIAGNOSIS — E876 Hypokalemia: Secondary | ICD-10-CM | POA: Diagnosis present

## 2011-11-20 DIAGNOSIS — R0602 Shortness of breath: Secondary | ICD-10-CM | POA: Diagnosis present

## 2011-11-20 HISTORY — DX: Essential (primary) hypertension: I10

## 2011-11-20 HISTORY — DX: Unspecified asthma, uncomplicated: J45.909

## 2011-11-20 LAB — COMPREHENSIVE METABOLIC PANEL
AST: 30 U/L (ref 0–37)
BUN: 10 mg/dL (ref 6–23)
CO2: 28 mEq/L (ref 19–32)
Calcium: 10.1 mg/dL (ref 8.4–10.5)
Creatinine, Ser: 0.65 mg/dL (ref 0.50–1.10)
GFR calc Af Amer: >90 mL/min (ref >90)
GFR calc non Af Amer: >90 mL/min (ref >90)
Glucose, Bld: 118 mg/dL — ABNORMAL HIGH (ref 70–99)

## 2011-11-20 LAB — CBC WITH DIFFERENTIAL/PLATELET
Basophils Absolute: 0 10*3/uL (ref 0.0–0.1)
Eosinophils Relative: 1 % (ref 0–5)
HCT: 39.8 % (ref 36.0–46.0)
Lymphocytes Relative: 20 % (ref 12–46)
MCHC: 33.9 g/dL (ref 30.0–36.0)
MCV: 83.4 fL (ref 78.0–100.0)
Monocytes Absolute: 0.6 10*3/uL (ref 0.1–1.0)
RDW: 13.1 % (ref 11.5–15.5)
WBC: 7.2 10*3/uL (ref 4.0–10.5)

## 2011-11-20 LAB — GLUCOSE, CAPILLARY
Glucose-Capillary: 102 mg/dL — ABNORMAL HIGH (ref 70–99)
Glucose-Capillary: 174 mg/dL — ABNORMAL HIGH (ref 70–99)

## 2011-11-20 LAB — PROTIME-INR
INR: 1.07 (ref 0.00–1.49)
Prothrombin Time: 14.1 seconds (ref 11.6–15.2)

## 2011-11-20 LAB — TROPONIN I: Troponin I: <0.3 ng/mL (ref <0.30)

## 2011-11-20 LAB — CARDIAC PANEL(CRET KIN+CKTOT+MB+TROPI)
CK, MB: 1.2 ng/mL (ref 0.3–4.0)
Troponin I: <0.3 ng/mL (ref <0.30)

## 2011-11-20 MED ORDER — ACETAMINOPHEN 325 MG PO TABS: 650.0000 mg | ORAL_TABLET | ORAL | Status: DC | PRN

## 2011-11-20 MED ORDER — NITROGLYCERIN 0.4 MG SL SUBL: 0.4000 mg | SUBLINGUAL_TABLET | SUBLINGUAL | Status: DC | PRN

## 2011-11-20 MED ORDER — PANTOPRAZOLE SODIUM 40 MG PO TBEC
40.0000 mg | DELAYED_RELEASE_TABLET | Freq: Every day | ORAL | Status: DC
  Administered 2011-11-20 – 2011-11-21 (×2): 40 mg via ORAL
  Filled 2011-11-20 (×2): qty 1

## 2011-11-20 MED ORDER — SODIUM CHLORIDE 0.9 % IV SOLN: 250.0000 mL | INTRAVENOUS | Status: DC | PRN

## 2011-11-20 MED ORDER — LEVOTHYROXINE SODIUM 88 MCG PO TABS
88.0000 ug | ORAL_TABLET | Freq: Every day | ORAL | Status: DC
  Administered 2011-11-21: 88 ug via ORAL
  Filled 2011-11-20: qty 1

## 2011-11-20 MED ORDER — DIAZEPAM 2 MG PO TABS
2.0000 mg | ORAL_TABLET | ORAL | Status: AC
  Administered 2011-11-21: 2 mg via ORAL
  Filled 2011-11-20: qty 1

## 2011-11-20 MED ORDER — SODIUM CHLORIDE 0.9 % IJ SOLN
3.0000 mL | Freq: Two times a day (BID) | INTRAMUSCULAR | Status: DC
  Administered 2011-11-20: 3 mL via INTRAVENOUS

## 2011-11-20 MED ORDER — HEPARIN BOLUS VIA INFUSION
4000.0000 [IU] | Freq: Once | INTRAVENOUS | Status: AC
  Administered 2011-11-20: 4000 [IU] via INTRAVENOUS
  Filled 2011-11-20: qty 4000

## 2011-11-20 MED ORDER — GLIMEPIRIDE 2 MG PO TABS
2.0000 mg | ORAL_TABLET | Freq: Every day | ORAL | Status: DC
  Filled 2011-11-20 (×2): qty 1

## 2011-11-20 MED ORDER — ONDANSETRON HCL 4 MG/2ML IJ SOLN: 4.0000 mg | Freq: Four times a day (QID) | INTRAMUSCULAR | Status: DC | PRN

## 2011-11-20 MED ORDER — DEXTROSE-NACL 5-0.45 % IV SOLN
INTRAVENOUS | Status: DC
  Administered 2011-11-20: 23:00:00 via INTRAVENOUS

## 2011-11-20 MED ORDER — ALBUTEROL 90 MCG/ACT IN AERS: 2.0000 | INHALATION_SPRAY | Freq: Four times a day (QID) | RESPIRATORY_TRACT | Status: DC | PRN

## 2011-11-20 MED ORDER — FLUTICASONE-SALMETEROL 250-50 MCG/DOSE IN AEPB
1.0000 | INHALATION_SPRAY | Freq: Two times a day (BID) | RESPIRATORY_TRACT | Status: DC
Start: 2011-11-20 — End: 2011-11-22
  Administered 2011-11-21 (×2): 1 via RESPIRATORY_TRACT
  Filled 2011-11-20: qty 14

## 2011-11-20 MED ORDER — NITROGLYCERIN 0.4 MG SL SUBL
0.4000 mg | SUBLINGUAL_TABLET | SUBLINGUAL | Status: DC | PRN
  Administered 2011-11-20: 0.4 mg via SUBLINGUAL
  Filled 2011-11-20: qty 25

## 2011-11-20 MED ORDER — SODIUM CHLORIDE 0.9 % IJ SOLN: 3.0000 mL | INTRAMUSCULAR | Status: DC | PRN

## 2011-11-20 MED ORDER — METOPROLOL TARTRATE 12.5 MG HALF TABLET
12.5000 mg | ORAL_TABLET | Freq: Two times a day (BID) | ORAL | Status: DC
  Administered 2011-11-20 – 2011-11-21 (×2): 12.5 mg via ORAL
  Filled 2011-11-20 (×3): qty 1

## 2011-11-20 MED ORDER — HYDROCHLOROTHIAZIDE 12.5 MG PO CAPS
12.5000 mg | ORAL_CAPSULE | Freq: Every day | ORAL | Status: DC
  Administered 2011-11-21: 12.5 mg via ORAL
  Filled 2011-11-20: qty 1

## 2011-11-20 MED ORDER — HEPARIN (PORCINE) IN NACL 100-0.45 UNIT/ML-% IJ SOLN
1200.0000 [IU]/h | INTRAMUSCULAR | Status: DC
  Administered 2011-11-20: 900 [IU]/h via INTRAVENOUS
  Administered 2011-11-21: 1200 [IU]/h via INTRAVENOUS
  Filled 2011-11-20 (×3): qty 250

## 2011-11-20 MED ORDER — POTASSIUM CHLORIDE CRYS ER 20 MEQ PO TBCR
40.0000 meq | EXTENDED_RELEASE_TABLET | Freq: Once | ORAL | Status: AC
  Administered 2011-11-20: 40 meq via ORAL
  Filled 2011-11-20: qty 2

## 2011-11-20 MED ORDER — ASPIRIN 81 MG PO TABS: 81.0000 mg | ORAL_TABLET | Freq: Every day | ORAL | Status: DC

## 2011-11-20 MED ORDER — ZOLPIDEM TARTRATE 5 MG PO TABS: 5.0000 mg | ORAL_TABLET | Freq: Every evening | ORAL | Status: DC | PRN

## 2011-11-20 MED ORDER — ALBUTEROL SULFATE HFA 108 (90 BASE) MCG/ACT IN AERS: 2.0000 | INHALATION_SPRAY | Freq: Four times a day (QID) | RESPIRATORY_TRACT | Status: DC | PRN

## 2011-11-20 MED ORDER — ALPRAZOLAM 0.25 MG PO TABS: 0.2500 mg | ORAL_TABLET | Freq: Two times a day (BID) | ORAL | Status: DC | PRN

## 2011-11-20 MED ORDER — ASPIRIN EC 81 MG PO TBEC
81.0000 mg | DELAYED_RELEASE_TABLET | Freq: Every day | ORAL | Status: DC
  Administered 2011-11-20 – 2011-11-21 (×2): 81 mg via ORAL
  Filled 2011-11-20 (×2): qty 1

## 2011-11-20 NOTE — Progress Notes (Signed)
ANTICOAGULATION CONSULT NOTE - Initial Consult  Pharmacy Consult for Heparin Indication: chest pain/ACS/STEMI  Allergies  Allergen Reactions  . Epinephrine Other (See Comments)    Pass out   . Simvastatin     REACTION: back ache    Patient Measurements: Height: 5\' 6"  (167.6 cm) Weight: 170 lb (77.111 kg) IBW/kg (Calculated) : 59.3  Heparin Dosing Weight: 75 kg  Vital Signs: Temp: 98.6 F (37 C) (06/26 1617) Temp src: Oral (06/26 1617) BP: 132/71 mmHg (06/26 2002) Pulse Rate: 81  (06/26 2002)  Labs:  Basename 11/20/11 1104 11/20/11 1103  HGB -- 13.5  HCT -- 39.8  PLT -- 225  APTT -- --  LABPROT -- --  INR -- --  HEPARINUNFRC -- --  CREATININE -- 0.65  CKTOTAL -- --  CKMB -- --  TROPONINI <0.30 --    Estimated Creatinine Clearance: 81.3 ml/min (by C-G formula based on Cr of 0.65).   Medical History: Past Medical History  Diagnosis Date  . Hypothyroidism   . Diabetes mellitus type II   . Fatty liver     abnormal transaminases; negative work up  . Atypical chest pain   . GERD (gastroesophageal reflux disease)   . Osteoarthritis     chronic, right knee  . Plantar fasciitis   . Chronic pain disorder     neck and shoulder   Assessment:   Chest pain today, now on the floor and pain free.   Planning cardiac cath on 11/21/11.   Will add baseline coags;   LFTs okay.    Goal of Therapy:  Heparin level 0.3-0.7 units/ml Monitor platelets by anticoagulation protocol: Yes   Plan:    Heparin 4000 units IV bolus.   Heparin drip to begin at 900 units/hr.   Heparin level ~ 6 hrs after drip begins.   Daily heparin level and CBC while on heparin.  Marya Landry Pager: [phone] 11/20/2011,9:05 PM

## 2011-11-20 NOTE — ED Notes (Signed)
Pt c/o cp 8/10 cardiology at bedside.

## 2011-11-20 NOTE — ED Notes (Signed)
Patient was at work and developed tightness in chest with nausea, denies radiation, vomiting or diaphoresis

## 2011-11-20 NOTE — H&P (Signed)
History and Physical   Patient ID: [name] [name] MRN: [id number], DOB/AGE: [birth year]   Admit date: 11/20/2011 Date of Consult: 11/20/2011  Primary Physician: Letitia Libra, Ala Dach, MD Primary Cardiologist: New to cardiology  Pt. Profile:   [name] [name] is a 56yo Caucasian female with PMHx significant for type 2 DM, HTN, HL, hypothyroidism, asthma and GERD who presents to [facility] ED today with chest pain.   HPI:   She reports intermittent, SSCP occurring this morning at work (works behind desk) lasting for 1-2 minutes associated with shortness of breath, palpitations, diaphoresis, nausea and lightheadedness. She was evaluated by a MD/RN in her office buidling. BP and pulse was noted to be elevated (HR 114). She rested, and the chest pain worsened. An EKG was performed, without ischemic changes. The patient has been intermittent all day, every 20 minutes. She was given NTG SL x 1 with alleviation. She underwent a cardiac stress test 5-6 years ago which was apparently normal. This is different from her GERD discomfort. No prior episodes of chest pain. Denies orthopnea, PND or LE edema. No n/v/d, fevers, chills, new cough or sick contacts. No active bleeding. No unilateral leg swelling, redness or tenderness. From an activity standpoint, she walks daily (7000 steps). She endorses occasional chest tightness on exertion, relieved with albuterol and advair inhalers. She was transported via EMS. She received ASA full-dose x 1.   Upon ED arrival, EKG reveals no acute ischemic changes. Troponin-I negative x 2. BMET reveals mild hypokalemia at 3.4. CBC WNL.   Problem List: Past Medical History  Diagnosis Date  . Hypothyroidism   . Diabetes mellitus type II   . Fatty liver     abnormal transaminases; negative work up  . Atypical chest pain   . GERD (gastroesophageal reflux disease)   . Osteoarthritis     chronic, right knee  . Plantar fasciitis   . Chronic pain disorder     neck and shoulder    Past Surgical History  Procedure Date  . Cholecystectomy      Allergies:  Allergies  Allergen Reactions  . Epinephrine Other (See Comments)    Pass out   . Simvastatin     REACTION: back ache    Home Medications: Prior to Admission medications   Medication Sig Start Date End Date Taking? Authorizing Provider  albuterol (PROVENTIL,VENTOLIN) 90 MCG/ACT inhaler Inhale 2 puffs into the lungs every 6 (six) hours as needed. For shortness of breath/wheezing   Yes Edwyna Perfect, MD  Ascorbic Acid (VITAMIN C) 500 MG tablet Take 500 mg by mouth daily.     Yes Historical Provider, MD  aspirin 81 MG tablet Take 81 mg by mouth daily.     Yes Historical Provider, MD  calcium-vitamin D (OSCAL WITH D) 500-200 MG-UNIT per tablet Take 1 tablet by mouth every evening.   Yes Historical Provider, MD  dexlansoprazole (DEXILANT) 60 MG capsule Take 60 mg by mouth daily.   Yes Edwyna Perfect, MD  Fluticasone-Salmeterol (ADVAIR) 250-50 MCG/DOSE AEPB Inhale 1 puff into the lungs 2 (two) times daily.   Yes Edwyna Perfect, MD  glimepiride (AMARYL) 2 MG tablet Take 2 mg by mouth every morning.   Yes Edwyna Perfect, MD  Glucosamine-Chondroitin (OSTEO BI-FLEX REGULAR STRENGTH) 250-200 MG TABS Take 1 tablet by mouth 2 (two) times daily.    Yes Historical Provider, MD  hydrochlorothiazide (MICROZIDE) 12.5 MG capsule Take 1 capsule (12.5 mg total) by mouth every morning. 07/31/11  Yes  Edwyna Perfect, MD  levothyroxine (SYNTHROID, LEVOTHROID) 88 MCG tablet Take 1 tablet (88 mcg total) by mouth daily. 08/12/11  Yes Edwyna Perfect, MD  rosuvastatin (CRESTOR) 10 MG tablet Take 10 mg by mouth once a week. Takes on wednesdays   Yes Historical Provider, MD  sitaGLIPtan-metformin (JANUMET) 50-500 MG per tablet Take 1 tablet by mouth 2 (two) times daily with a meal. 06/19/11  Yes Edwyna Perfect, MD  VITAMIN E PO Take 1 capsule by mouth daily.   Yes Historical Provider, MD    Inpatient Medications:     (Not in a  hospital admission)  Family History  Problem Relation Age of Onset  . Alzheimer's disease Mother      History   Social History  . Marital Status: Married    Spouse Name: N/A    Number of Children: 1  . 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 Smoking status: Never Smoker   . Smokeless tobacco: Never Used  . Alcohol Use: No  . Drug Use: Not on file  . Sexually Active: Not on file   Other Topics Concern  . Not on file   Social History Narrative   Multiple family members with intolerance to statins.     Review of Systems: General: negative for chills, fever, night sweats or weight changes.  Cardiovascular: positive for chest pain, shortness of breath, diaphoresis, palpitations, negative for dyspnea on exertion, edema, orthopnea, paroxysmal nocturnal dyspnea  Dermatological: negative for rash Respiratory: negative for cough or wheezing Urologic: negative for hematuria Abdominal: positive for nausea, negative for vomiting, diarrhea, bright red blood per rectum, melena, or hematemesis Neurologic:   negative for visual changes, syncope, or dizziness All other systems reviewed and are otherwise negative except as noted above.  Physical Exam: Blood pressure 135/66, pulse 81, temperature 98.6 F (37 C), temperature source Oral, resp. rate 14, SpO2 98.00%.   General: Well developed, well nourished, in no acute distress. Head: Normocephalic, atraumatic, sclera non-icteric, no xanthomas, nares are without discharge.  Neck: Negative for carotid bruits. JVD not elevated. Lungs: Clear bilaterally to auscultation without wheezes, rales, or rhonchi. Breathing is unlabored. Heart: RRR with S1 S2. No murmurs, rubs, or gallops appreciated. Abdomen: Soft, non-tender, non-distended with normoactive bowel sounds. No hepatomegaly. No rebound/guarding. No obvious abdominal masses. Msk:  Strength and tone appears normal for age. Extremities:  No clubbing, cyanosis or edema.  Distal pedal pulses are 2+ and equal bilaterally. Neuro: Alert and oriented X 3. Moves all extremities spontaneously. Psych:  Responds to questions appropriately with a normal affect.  Labs: Recent Labs  Basename 11/20/11 1103   WBC 7.2   HGB 13.5   HCT 39.8   MCV 83.4   PLT 225   Lab 11/20/11 1103  NA 142  K 3.4*  CL 100  CO2 28  BUN 10  CREATININE 0.65  CALCIUM 10.1  PROT 7.6  BILITOT 0.5  ALKPHOS 71  ALT 39*  AST 30  AMYLASE --  LIPASE --  GLUCOSE 118*   Recent Labs  Basename 11/20/11 1104   CKTOTAL --   CKMB --   CKMBINDEX --   TROPONINI <0.30   Radiology/Studies: Dg Abd Acute W/chest  11/20/2011  *RADIOLOGY REPORT*  Clinical Data: Chest tightness. Nausea.  ACUTE ABDOMEN SERIES (ABDOMEN 2 VIEW & CHEST 1 VIEW)  Comparison: Chest radiograph 04/01/2008.  Findings: Frontal view of the chest shows midline trachea  and normal heart size.  Lungs are somewhat low in volume but clear.  No pleural fluid.  Two views of the abdomen show a relative paucity of gas in the abdomen.  Scattered gas is seen within the colon.  Stool is seen in the majority of the colon.  No definite air-fluid levels.  Surgical clips in the right upper quadrant.  IMPRESSION: Bowel gas pattern favors constipation.  Original Report Authenticated By: Reyes Ivan, M.D.    EKG: NSR, 93 bpm, no ST-T wave changes; poor R wave progression   ASSESSMENT:   1. Chest pain 2. Type 2 DM 3. HTN 4. HL 5. GERD 6. Hypokalemia  DISCUSSION/PLAN:   The patient has significant cardiac risk factors noted above and HPI concerning for unstable angina. She had a negative stress test 5-6 years ago, however experienced intermittent SSCP today with associated nausea, diaphoresis and shortness of breath. Objectively, EKG reveals no evidence of ischemia. Troponin-I negative x 2. She is mildly hypokalemic. Otherwise, CBC and BMET WNL. Given her cardiac risk factors, and chest pain  concerning for unstable angina, will admit and plan for cardiac catheterization tomorrow. Will start on heparin IV. Continue low-dose ASA. Add Lopressor. Hold off on statin given intolerance. Supplement K with KCL 40 mEq PO x 1. Hold metformin. Check CBGs. If need be, will start on SSI. She has very good glycemic control outpatient. Will enter pre-cath orders. Heart healthy diet tonight, NPO midnight. Cycle cardiac biomarkers. Further recommendation upon interventionalist's findings tomorrow.   Signed, R. Hurman Horn, PA-C 11/20/2011, 7:11 PM  Patient seen in ER with Mr. Francee Gentile.  She gives history of stuttering chest pain described as a squeezing pressure, associated with dyspnea.  The discomfort responded to SL NTG. EKG is normal and unchanged since 2007. Patient states pain is different from her previous GERD pain. Her physical exam is unremarkable. Will plan for cath in am.  Discussed with patient who is familiar with cath procedure since her husband and her mother have had stents.

## 2011-11-20 NOTE — ED Provider Notes (Signed)
4pm  Report received from Dr. Estell Harpin for CDU chest pain protocol.  Spoke with patient and she continues to have L chest pressure about every 20 minutes that last 1-2 minutes.  Patient is not appropriate for protocol with continued cp.  Will get one more marker and try sl ntg.  BMI 27.4.   6pm patient continues to have chest pain. Chest pain was relieved by nitroglycerin but soon returned. Patient will be admitted to the hospital. Not appropriate for CDU protocol. 1900 Patient continues to have chest pressure intermittantly relieved by ntg.  [facility] to see in CDU 9 and admit.  Trop negative EKG unremarkable.     Labs Reviewed  COMPREHENSIVE METABOLIC PANEL - Abnormal; Notable for the following:    Potassium 3.4 (*)     Glucose, Bld 118 (*)     ALT 39 (*)     All other components within normal limits  CBC WITH DIFFERENTIAL  TROPONIN I  POCT I-STAT TROPONIN I    Remi Haggard, NP 11/20/11 1904

## 2011-11-20 NOTE — ED Provider Notes (Cosign Needed Addendum)
History     CSN: [id number]  Arrival date & time 11/20/11  1011   First MD Initiated Contact with Patient 11/20/11 1026      Chief Complaint  Patient presents with  . Chest Pain    (Consider location/radiation/quality/duration/timing/severity/associated sxs/prior treatment) Patient is a 56 y.o. female presenting with chest pain. The history is provided by the patient (pt has had chest pain and palpitations today.  some sob). No language interpreter was used.  Chest Pain The chest pain began less than 1 hour ago. Chest pain occurs intermittently. The chest pain is resolved. At its most intense, the pain is at 5/10. The pain is currently at 3/10. The severity of the pain is moderate. The quality of the pain is described as aching. The pain does not radiate. Exacerbated by: nothing. Pertinent negatives for primary symptoms include no fever, no fatigue, no cough and no abdominal pain.  Pertinent negatives for associated symptoms include no claudication. She tried nothing for the symptoms. Risk factors: diabetes.  Pertinent negatives for past medical history include no seizures.     Past Medical History  Diagnosis Date  . Hypothyroidism   . Diabetes mellitus type II   . Fatty liver     abnormal transaminases; negative work up  . Atypical chest pain   . GERD (gastroesophageal reflux disease)   . Osteoarthritis     chronic, right knee  . Plantar fasciitis   . Chronic pain disorder     neck and shoulder    Past Surgical History  Procedure Date  . Cholecystectomy     Family History  Problem Relation Age of Onset  . Alzheimer's disease Mother     History  Substance Use Topics  . Smoking status: Never Smoker   . Smokeless tobacco: Never Used  . Alcohol Use: No    OB History    Grav Para Term Preterm Abortions TAB SAB Ect Mult Living                  Review of Systems  Constitutional: Negative for fever and fatigue.  HENT: Negative for congestion, sinus pressure and  ear discharge.   Eyes: Negative for discharge.  Respiratory: Negative for cough.   Cardiovascular: Positive for chest pain. Negative for claudication.  Gastrointestinal: Negative for abdominal pain and diarrhea.  Genitourinary: Negative for frequency and hematuria.  Musculoskeletal: Negative for back pain.  Skin: Negative for rash.  Neurological: Negative for seizures and headaches.  Hematological: Negative.   Psychiatric/Behavioral: Negative for hallucinations.    Allergies  Epinephrine and Simvastatin  Home Medications   Current Outpatient Rx  Name Route Sig Dispense Refill  . ALBUTEROL 90 MCG/ACT IN AERS Inhalation Inhale 2 puffs into the lungs every 6 (six) hours as needed. For shortness of breath/wheezing    . VITAMIN C 500 MG PO TABS Oral Take 500 mg by mouth daily.      . ASPIRIN 81 MG PO TABS Oral Take 81 mg by mouth daily.      Marland Kitchen CALCIUM CARBONATE-VITAMIN D 500-200 MG-UNIT PO TABS Oral Take 1 tablet by mouth every evening.    . DEXLANSOPRAZOLE 60 MG PO CPDR Oral Take 60 mg by mouth daily.    Marland Kitchen FLUTICASONE-SALMETEROL 250-50 MCG/DOSE IN AEPB Inhalation Inhale 1 puff into the lungs 2 (two) times daily.    Marland Kitchen GLIMEPIRIDE 2 MG PO TABS Oral Take 2 mg by mouth every morning.    Marland Kitchen GLUCOSAMINE-CHONDROITIN 250-200 MG PO TABS Oral Take 1  tablet by mouth 2 (two) times daily.     Marland Kitchen HYDROCHLOROTHIAZIDE 12.5 MG PO CAPS Oral Take 1 capsule (12.5 mg total) by mouth every morning. 90 capsule 1  . LEVOTHYROXINE SODIUM 88 MCG PO TABS Oral Take 1 tablet (88 mcg total) by mouth daily. 90 tablet 1  . ROSUVASTATIN CALCIUM 10 MG PO TABS Oral Take 10 mg by mouth once a week. Takes on wednesdays    . SITAGLIPTIN-METFORMIN HCL 50-500 MG PO TABS Oral Take 1 tablet by mouth 2 (two) times daily with a meal.    . VITAMIN E PO Oral Take 1 capsule by mouth daily.      BP 118/57  Pulse 83  Resp 15  SpO2 96%  Physical Exam  Constitutional: She is oriented to person, place, and time. She appears  well-developed.  HENT:  Head: Normocephalic and atraumatic.  Eyes: Conjunctivae and EOM are normal. No scleral icterus.  Neck: Neck supple. No thyromegaly present.  Cardiovascular: Normal rate and regular rhythm.  Exam reveals no gallop and no friction rub.   No murmur heard. Pulmonary/Chest: No stridor. She has no wheezes. She has no rales. She exhibits no tenderness.  Abdominal: She exhibits no distension. There is no tenderness. There is no rebound.  Musculoskeletal: Normal range of motion. She exhibits no edema.  Lymphadenopathy:    She has no cervical adenopathy.  Neurological: She is oriented to person, place, and time. Coordination normal.  Skin: No rash noted. No erythema.  Psychiatric: She has a normal mood and affect. Her behavior is normal.    ED Course  Procedures (including critical care time)  Labs Reviewed  COMPREHENSIVE METABOLIC PANEL - Abnormal; Notable for the following:    Potassium 3.4 (*)     Glucose, Bld 118 (*)     ALT 39 (*)     All other components within normal limits  CBC WITH DIFFERENTIAL  TROPONIN I   Dg Abd Acute W/chest  11/20/2011  *RADIOLOGY REPORT*  Clinical Data: Chest tightness. Nausea.  ACUTE ABDOMEN SERIES (ABDOMEN 2 VIEW & CHEST 1 VIEW)  Comparison: Chest radiograph 04/01/2008.  Findings: Frontal view of the chest shows midline trachea and normal heart size.  Lungs are somewhat low in volume but clear.  No pleural fluid.  Two views of the abdomen show a relative paucity of gas in the abdomen.  Scattered gas is seen within the colon.  Stool is seen in the majority of the colon.  No definite air-fluid levels.  Surgical clips in the right upper quadrant.  IMPRESSION: Bowel gas pattern favors constipation.  Original Report Authenticated By: Reyes Ivan, M.D.     No diagnosis found.    Date: 11/20/2011  Rate:93  Rhythm: normal sinus rhythm  QRS Axis: normal  Intervals: normal  ST/T Wave abnormalities: normal  Conduction  Disutrbances:none  Narrative Interpretation:   Old EKG Reviewed: none available Pt will go to cdu for chest pain protocol  MDM          Benny Lennert, MD 11/20/11 1455  Benny Lennert, MD 11/20/11 [phone]

## 2011-11-20 NOTE — ED Notes (Signed)
Family at bedside. 

## 2011-11-20 NOTE — ED Notes (Signed)
Pt is complaining of chest discomfort characterized as pressure and achy which the rates at 6/10. NTG 1 tablet SL was given, will monitor

## 2011-11-21 ENCOUNTER — Encounter (HOSPITAL_COMMUNITY)
Admission: EM | Disposition: A | Discharge status: Home / Self Care | Payer: Self-pay | Source: Home / Self Care | Attending: Cardiology

## 2011-11-21 ENCOUNTER — Encounter (HOSPITAL_COMMUNITY): Payer: Self-pay | Admitting: Cardiology

## 2011-11-21 DIAGNOSIS — R079 Chest pain, unspecified: Secondary | ICD-10-CM

## 2011-11-21 HISTORY — PX: LEFT HEART CATHETERIZATION WITH CORONARY ANGIOGRAM: SHX5451

## 2011-11-21 LAB — BASIC METABOLIC PANEL
BUN: 13 mg/dL (ref 6–23)
Calcium: 9.2 mg/dL (ref 8.4–10.5)
Chloride: 101 mEq/L (ref 96–112)
Creatinine, Ser: 0.79 mg/dL (ref 0.50–1.10)
GFR calc Af Amer: >90 mL/min (ref >90)

## 2011-11-21 LAB — CBC
HCT: 36.7 % (ref 36.0–46.0)
MCHC: 33.2 g/dL (ref 30.0–36.0)
MCV: 84.4 fL (ref 78.0–100.0)
Platelets: 206 10*3/uL (ref 150–400)
RDW: 13.4 % (ref 11.5–15.5)

## 2011-11-21 LAB — GLUCOSE, CAPILLARY
Glucose-Capillary: 123 mg/dL — ABNORMAL HIGH (ref 70–99)
Glucose-Capillary: 124 mg/dL — ABNORMAL HIGH (ref 70–99)

## 2011-11-21 LAB — CARDIAC PANEL(CRET KIN+CKTOT+MB+TROPI)
CK, MB: 1.2 ng/mL (ref 0.3–4.0)
Total CK: 46 U/L (ref 7–177)
Total CK: 47 U/L (ref 7–177)

## 2011-11-21 LAB — HEPARIN LEVEL (UNFRACTIONATED): Heparin Unfractionated: 0.32 IU/mL (ref 0.30–0.70)

## 2011-11-21 SURGERY — LEFT HEART CATHETERIZATION WITH CORONARY ANGIOGRAM
Anesthesia: LOCAL

## 2011-11-21 MED ORDER — ASPIRIN EC 325 MG PO TBEC: 325.0000 mg | DELAYED_RELEASE_TABLET | Freq: Every day | ORAL | Status: DC

## 2011-11-21 MED ORDER — ONDANSETRON HCL 4 MG/2ML IJ SOLN: 4.0000 mg | Freq: Four times a day (QID) | INTRAMUSCULAR | Status: DC | PRN

## 2011-11-21 MED ORDER — SODIUM CHLORIDE 0.9 % IJ SOLN: 3.0000 mL | INTRAMUSCULAR | Status: DC | PRN

## 2011-11-21 MED ORDER — LIDOCAINE HCL (PF) 1 % IJ SOLN
INTRAMUSCULAR | Status: AC
  Filled 2011-11-21: qty 30

## 2011-11-21 MED ORDER — SITAGLIPTIN PHOS-METFORMIN HCL 50-500 MG PO TABS: 1.0000 | ORAL_TABLET | Freq: Two times a day (BID) | ORAL | Status: DC

## 2011-11-21 MED ORDER — SODIUM CHLORIDE 0.45 % IV SOLN
INTRAVENOUS | Status: AC
  Administered 2011-11-21: 11:00:00 via INTRAVENOUS

## 2011-11-21 MED ORDER — SODIUM CHLORIDE 0.9 % IV SOLN: 250.0000 mL | INTRAVENOUS | Status: DC

## 2011-11-21 MED ORDER — OXYCODONE-ACETAMINOPHEN 5-325 MG PO TABS: 1.0000 | ORAL_TABLET | ORAL | Status: DC | PRN

## 2011-11-21 MED ORDER — SODIUM CHLORIDE 0.9 % IJ SOLN: 3.0000 mL | Freq: Two times a day (BID) | INTRAMUSCULAR | Status: DC

## 2011-11-21 MED ORDER — HEPARIN (PORCINE) IN NACL 2-0.9 UNIT/ML-% IJ SOLN
INTRAMUSCULAR | Status: AC
  Filled 2011-11-21: qty 1000

## 2011-11-21 MED ORDER — NITROGLYCERIN 0.2 MG/ML ON CALL CATH LAB
INTRAVENOUS | Status: AC
  Filled 2011-11-21: qty 1

## 2011-11-21 MED ORDER — MIDAZOLAM HCL 2 MG/2ML IJ SOLN
INTRAMUSCULAR | Status: AC
  Filled 2011-11-21: qty 2

## 2011-11-21 MED ORDER — ACETAMINOPHEN 325 MG PO TABS: 650.0000 mg | ORAL_TABLET | ORAL | Status: DC | PRN

## 2011-11-21 NOTE — Care Management Note (Unsigned)
    Page 1 of 1   11/21/2011     9:08:54 AM   CARE MANAGEMENT NOTE 11/21/2011  Patient:  [name] [name], [name] [name]   Account Number:  [id number]  Date Initiated:  11/21/2011  Documentation initiated by:  GRAVES-BIGELOW,Boykin Baetz  Subjective/Objective Assessment:   Pt admitted with cp. Plan for cath.     Action/Plan:   CM will continue to monitor for disposition needs.   Anticipated DC Date:  11/23/2011   Anticipated DC Plan:  HOME/SELF CARE      DC Planning Services  CM consult      Choice offered to / List presented to:             Status of service:  In process, will continue to follow Medicare Important Message given?   (If response is "NO", the following Medicare IM given date fields will be blank) Date Medicare IM given:   Date Additional Medicare IM given:    Discharge Disposition:    Per UR Regulation:  Reviewed for med. necessity/level of care/duration of stay  If discussed at Long Length of Stay Meetings, dates discussed:    Comments:

## 2011-11-21 NOTE — Progress Notes (Signed)
ANTICOAGULATION CONSULT NOTE - Follow Up Consult  Pharmacy Consult for heparin Indication: chest pain/ACS  Labs:  Basename 11/21/11 0855 11/21/11 0247 11/20/11 2122 11/20/11 2121 11/20/11 1103  HGB -- 12.2 -- -- 13.5  HCT -- 36.7 -- -- 39.8  PLT -- 206 -- -- 225  APTT -- -- 29 -- --  LABPROT -- -- 14.1 -- --  INR -- -- 1.07 -- --  HEPARINUNFRC 0.32 0.22* -- -- --  CREATININE -- 0.79 -- -- 0.65  CKTOTAL 47 46 -- 49 --  CKMB 1.2 1.2 -- 1.2 --  TROPONINI <0.30 <0.30 -- <0.30 --    Assessment: 56yo female subtherapeutic on heparin with initial dosing for CP; S/p cath. Non sig CAD. Non cardiac CP.     Plan:  Dc heparin  Clide Cliff PharmD BCPS 11/21/2011,3:07 PM

## 2011-11-21 NOTE — Progress Notes (Signed)
Patient: MOANI WEIPERT Date of Encounter: 11/21/2011, 6:36 AM Admit date: 11/20/2011     Subjective  No CP/SOB. Had CP at rest on Tuesday night and Wednesday morning. Walked on Tuesday without CP, but has had recent increase in SOB (she was initially attributing to asthma but wasn't sure).   Objective   Telemetry: NSR, occ PVC Physical Exam: Filed Vitals:   11/21/11 0041  BP: 114/57  Pulse: 80  Temp: 98.4  Resp: 18   General: Well developed, well nourished WF in no acute distress. Head: Normocephalic, atraumatic, sclera non-icteric, no xanthomas, nares are without discharge.  Neck: Negative for carotid bruits. JVD not elevated. Lungs: Clear bilaterally to auscultation without wheezes, rales, or rhonchi. Breathing is unlabored. Heart: RRR S1 S2 without murmurs, rubs, or gallops.  Abdomen: Soft, non-tender, non-distended with normoactive bowel sounds. No hepatomegaly. No rebound/guarding. No obvious abdominal masses. Msk:  Strength and tone appear normal for age. Extremities: No clubbing or cyanosis. No edema.  Distal pedal pulses are 2+ and equal bilaterally. Neuro: Alert and oriented X 3. Moves all extremities spontaneously. Psych:  Responds to questions appropriately with a normal affect.   No intake or output data in the 24 hours ending 11/21/11 0636  Inpatient Medications:    . aspirin EC  81 mg Oral Daily  . diazepam  2 mg Oral On Call  . Fluticasone-Salmeterol  1 puff Inhalation BID  . glimepiride  2 mg Oral QAC breakfast  . heparin  4,000 Units Intravenous Once  . hydrochlorothiazide  12.5 mg Oral Daily  . levothyroxine  88 mcg Oral Daily  . metoprolol tartrate  12.5 mg Oral BID  . pantoprazole  40 mg Oral Q1200  . potassium chloride  40 mEq Oral Once  . sodium chloride  3 mL Intravenous Q12H  . DISCONTD: aspirin  81 mg Oral Daily    Labs:  [facility] 11/21/11 0247 11/20/11 1103  NA 138 142  K 4.1 3.4*  CL 101 100  CO2 27 28  GLUCOSE 137* 118*  BUN 13 10   CREATININE 0.79 0.65  CALCIUM 9.2 10.1  MG -- --  PHOS -- --    Basename 11/20/11 1103  AST 30  ALT 39*  ALKPHOS 71  BILITOT 0.5  PROT 7.6  ALBUMIN 4.1    Basename 11/21/11 0247 11/20/11 1103  WBC 7.1 7.2  NEUTROABS -- 5.1  HGB 12.2 13.5  HCT 36.7 39.8  MCV 84.4 83.4  PLT 206 225    Basename 11/21/11 0247 11/20/11 2121 11/20/11 1104  CKTOTAL 46 49 --  CKMB 1.2 1.2 --  TROPONINI <0.30 <0.30 <0.30   Radiology/Studies:  Dg Abd Acute W/chest 11/20/2011  *RADIOLOGY REPORT*  Clinical Data: Chest tightness. Nausea.  ACUTE ABDOMEN SERIES (ABDOMEN 2 VIEW & CHEST 1 VIEW)  Comparison: Chest radiograph 04/01/2008.  Findings: Frontal view of the chest shows midline trachea and normal heart size.  Lungs are somewhat low in volume but clear.  No pleural fluid.  Two views of the abdomen show a relative paucity of gas in the abdomen.  Scattered gas is seen within the colon.  Stool is seen in the majority of the colon.  No definite air-fluid levels.  Surgical clips in the right upper quadrant.  IMPRESSION: Bowel gas pattern favors constipation.  Original Report Authenticated By: Reyes Ivan, M.D.     Assessment and Plan   1. Chest pain felt concerning for [place] with cardiac risk factors, although no objective evidence for  ischemia thus far. CP free at present. 2. HTN - controlled 3. DM - continue to hold metformin for cath 4. Hypothyroidism - continue synthroid 5. Hypokalemia - resolved with repletion  Cath has not officially been scheduled yet for today, will d/w Dr. Tenny Craw. Continue current therapies. Negative enzymes thus far.  Signed, Ronie Spies PA-C  Patient seen and examined.  Agree with findings of D Dunn Patient with risks for CAD.  Presentation was abrupt and signif.  Agree with T Brackbill  Will plan on cath today to define anatomy.  Patient understands risks   Agrees to proceed.  Dietrich Pates 8:52 AM

## 2011-11-21 NOTE — ED Provider Notes (Signed)
Medical screening examination/treatment/procedure(s) were conducted as a shared visit with non-physician practitioner(s) and myself.  I personally evaluated the patient during the encounter   Benny Lennert, MD 11/21/11 [phone]

## 2011-11-21 NOTE — Progress Notes (Signed)
UR Completed Kollins Fenter Graves-Bigelow, RN,BSN [phone]  

## 2011-11-21 NOTE — Discharge Summary (Signed)
Discharge Summary   Patient ID: [name] [name] MRN: [id number], DOB/AGE: November 07, 1955 56 y.o.  Primary MD: Letitia Libra, Ala Dach, MD Primary Cardiologist: None Admit date: 11/20/2011 D/C date:     11/21/2011      Primary Discharge Diagnoses:  1. Chest pain, Noncardiac   - Cath revealed normal coronary arteries and LV function  - F/u with PCP  Secondary Discharge Diagnoses:  1. Hypertension 2. Hyperlipidemia 3. Hypothyroidism   4. Asthma 5. Diabetes mellitus type II   6. Fatty liver abnormal transaminases; negative work up   7. GERD (gastroesophageal reflux disease)   8. Osteoarthritis chronic, right knee   9. Plantar fasciitis   10. Chronic pain disorder neck and shoulder  11. Cholecystectomy   Allergies Allergies  Allergen Reactions  . Epinephrine Other (See Comments)    Pass out   . Simvastatin     REACTION: back ache    Diagnostic Studies/Procedures:   11/21/11 - Cardiac Cath Coronary Arteries:  Right dominant with no anomalies  LM: Normal  LAD: normal  D1 normal  D2: Small and normal  D3 Small and normal  Circumflex: normal  OM1: normal  [id number]  PDA: normal  PLA: Small and normal  Ventriculography: EF: 60 %, no RWMA  Hemodynamics:  Aortic Pressure: 124 68 mmHg LV Pressure: 130 14 mmHg  Impression: No significant CAD Would appear to be noncardiac chest pain. Tolerated procedure well  11/20/2011 - Abd Acute W/chest Findings: Frontal view of the chest shows midline trachea and normal heart size.  Lungs are somewhat low in volume but clear.  No pleural fluid.  Two views of the abdomen show a relative paucity of gas in the abdomen.  Scattered gas is seen within the colon.  Stool is seen in the majority of the colon.  No definite air-fluid levels.  Surgical clips in the right upper quadrant.  IMPRESSION: Bowel gas pattern favors constipation.   History of Present Illness: 56 y.o. female w/ the above medical problems who presented to [facility]  on 11/20/11 with complaints of chest pain.  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 She was evaluated by a MD/RN in her office buidling. BP and pulse was noted to be elevated (HR 114). She rested, and the chest pain worsened. An EKG was performed, without ischemic changes. The pain had been intermittent all day, every 20 minutes. She was given NTG SL x 1 with alleviation. She underwent a cardiac stress test 5-6 years ago which was apparently normal. This is different from her GERD discomfort.   Hospital Course: EKG revealed NSR with no acute ischemic changes. Abd XRay showed bowel gas pattern favoring constipation. Labs were significant for normal troponins, mild hypokalemia and otherwise unremarkable BMET/CBC. She was placed on IV heparin for concern of unstable angina and admitted for further evaluation and treatment.   Cardiac enzymes were cycled and remained negative. With her cardiac risk factors and symptoms concerning for unstable angina it was felt she would benefit from cardiac cath. Cath on 11/21/11 revealed normal coronary arteries and LV function. She tolerated the procedure well without complications. Her chest pain was felt to be noncardiac in etiology. She was able to ambulate without any further chest pain. Recommendations were made for follow up with her primary care provider.   She was seen and evaluated by Dr. Tenny Craw who felt  she was stable for discharge home with plans for follow up as scheduled below.  Discharge Vitals: Blood pressure 120/63, pulse 77, temperature 98.3 F (36.8 C), temperature source Oral, resp. rate 18, height 5\' 6"  (1.676 m), weight 170 lb (77.111 kg), SpO2 98.00%.  Labs: Component Value Date   WBC 7.1 11/21/2011   HGB 12.2 11/21/2011   HCT 36.7 11/21/2011   MCV 84.4 11/21/2011   PLT 206  11/21/2011    Lab 11/21/11 0247 11/20/11 1103  NA 138 --  K 4.1 --  CL 101 --  CO2 27 --  BUN 13 --  CREATININE 0.79 --  CALCIUM 9.2 --  PROT -- 7.6  BILITOT -- 0.5  ALKPHOS -- 71  ALT -- 39*  AST -- 30  GLUCOSE 137* --   Basename 11/21/11 0855 11/21/11 0247 11/20/11 2121 11/20/11 1104  CKTOTAL 47 46 49 --  CKMB 1.2 1.2 1.2 --  TROPONINI <0.30 <0.30 <0.30 <0.30     Discharge Medications   Medication List  As of 11/21/2011  5:27 PM   TAKE these medications         albuterol 90 MCG/ACT inhaler   Commonly known as: PROVENTIL,VENTOLIN   Inhale 2 puffs into the lungs every 6 (six) hours as needed. For shortness of breath/wheezing      aspirin 81 MG tablet   Take 81 mg by mouth daily.      calcium-vitamin D 500-200 MG-UNIT per tablet   Commonly known as: OSCAL WITH D   Take 1 tablet by mouth every evening.      dexlansoprazole 60 MG capsule   Commonly known as: DEXILANT   Take 60 mg by mouth daily.      Fluticasone-Salmeterol 250-50 MCG/DOSE Aepb   Commonly known as: ADVAIR   Inhale 1 puff into the lungs 2 (two) times daily.      glimepiride 2 MG tablet   Commonly known as: AMARYL   Take 2 mg by mouth every morning.      hydrochlorothiazide 12.5 MG capsule   Commonly known as: MICROZIDE   Take 1 capsule (12.5 mg total) by mouth every morning.      levothyroxine 88 MCG tablet   Commonly known as: SYNTHROID, LEVOTHROID   Take 1 tablet (88 mcg total) by mouth daily.      OSTEO BI-FLEX REGULAR STRENGTH 250-200 MG Tabs   Generic drug: Glucosamine-Chondroitin   Take 1 tablet by mouth 2 (two) times daily.      rosuvastatin 10 MG tablet   Commonly known as: CRESTOR   Take 10 mg by mouth once a week. Takes on wednesdays      sitaGLIPtan-metformin 50-500 MG per tablet   Commonly known as: JANUMET   Take 1 tablet by mouth 2 (two) times daily with a meal. Hold for 48hrs after heart procedure. May resume on 11/24/11.      vitamin C 500 MG tablet   Commonly known as:  ASCORBIC ACID   Take 500 mg by mouth daily.      VITAMIN E PO   Take 1 capsule by mouth daily.            Disposition   Discharge Orders    Future Appointments: Provider: Department: Dept Phone: Center:   11/27/2011 12:10 PM [facility] [phone] [facility]   12/18/2011 8:30 AM Edwyna Perfect, MD [facility] [phone] [facility]     Future Orders Please Complete By Expires   Diet -  low sodium heart healthy      Increase activity slowly      Discharge instructions      Comments:   * KEEP GROIN SITE CLEAN AND DRY. Call the office for any signs of bleedings, pus, swelling, increased pain, or any other concerns. * NO HEAVY LIFTING (>10lbs) OR SEXUAL ACTIVITY X 7 DAYS. * NO DRIVING X 2-3 DAYS. * NO SOAKING BATHS, HOT TUBS, POOLS, ETC., X 7 DAYS.  * Your heart catheterization did not show any blockages in the vessels around your heart. There were no findings to suggest your chest pain is coming from your heart. Please follow up with your primary care provider for further evaluation.  * Hold your Janumet for 48hrs after heart procedure. May resume on 11/24/11.     Follow-up Information    Follow up with Letitia Libra, Ala Dach, MD. (As needed)    Contact information:   [address] [facility] [place] [address] [phone]           Outstanding Labs/Studies:  None  Duration of Discharge Encounter: Greater than 30 minutes including physician and PA time.  Signed, Griffey Nicasio PA-C 11/21/2011, 5:27 PM

## 2011-11-21 NOTE — CV Procedure (Signed)
      Catheterization   Indication: Chest Pain  Procedure: After informed consent and clinical "time out" the right groin was prepped and draped in a sterile fashion.  A 5Fr sheath was placed in the right femoral artery using seldinger technique and local lidocaine.  Standard JL3.5 , JR4 and angled pigtail catheters were used to engage the coronary arteries.  Coronary arteries were visualized in orthogonal views using caudal and cranial angulation.  RAO ventriculography was done using 24* cc of contrast.    Medications:   Versed: 2 mg's  Fentanyl: 0 ug's  Coronary Arteries: Right dominant with no anomalies  LM: Normal  LAD: normal    D1  normal  D2:  Small and normal  D3  Small and normal  Circumflex: normal   OM1: normal  [id number]   PDA: normal  PLA:  Small and normal  Ventriculography: EF: 60 %, no RWMA  Hemodynamics:  Aortic Pressure: 124 68  mmHg  LV Pressure: 130 14  mmHg  Impression:  No significant CAD  Would appear to be noncardiac chest pain.  Tolerated procedure well  Charlton Haws 11/21/2011 10:26 AM

## 2011-11-21 NOTE — Interval H&P Note (Signed)
History and Physical Interval Note:  11/21/2011 9:54 AM  [name] [name]  has presented today for surgery, with the diagnosis of chest pain  The various methods of treatment have been discussed with the patient and family. After consideration of risks, benefits and other options for treatment, the patient has consented to  Procedure(s) (LRB): LEFT HEART CATHETERIZATION WITH CORONARY ANGIOGRAM (N/A) as a surgical intervention .  The patient's history has been reviewed, patient examined, no change in status, stable for surgery.  I have reviewed the patients' chart and labs.  Questions were answered to the patient's satisfaction.     Charlton Haws

## 2011-11-21 NOTE — Progress Notes (Signed)
ANTICOAGULATION CONSULT NOTE - Follow Up Consult  Pharmacy Consult for heparin Indication: chest pain/ACS  Labs:  Basename 11/21/11 0247 11/20/11 2122 11/20/11 2121 11/20/11 1104 11/20/11 1103  HGB 12.2 -- -- -- 13.5  HCT 36.7 -- -- -- 39.8  PLT 206 -- -- -- 225  APTT -- 29 -- -- --  LABPROT -- 14.1 -- -- --  INR -- 1.07 -- -- --  HEPARINUNFRC 0.22* -- -- -- --  CREATININE -- -- -- -- 0.65  CKTOTAL -- -- 49 -- --  CKMB -- -- 1.2 -- --  TROPONINI -- -- <0.30 <0.30 --    Assessment: 56yo female subtherapeutic on heparin with initial dosing for CP; of note, lab was drawn early just four hours after bolus was given so will assume this level is inaccurate and would expect true level to be lower.  Goal of Therapy:  Heparin level 0.3-0.7 units/ml   Plan:  Will increase heparin gtt by 4 units/kg/hr to 1200 units/hr and check level with next CE.  Colleen Can PharmD BCPS 11/21/2011,4:08 AM

## 2011-11-21 NOTE — Discharge Instructions (Signed)
Groin Site Care Refer to this sheet in the next few weeks. These instructions provide you with information on caring for yourself after your procedure. Your caregiver may also give you more specific instructions. Your treatment has been planned according to current medical practices, but problems sometimes occur. Call your caregiver if you have any problems or questions after your procedure. HOME CARE INSTRUCTIONS  You may shower 24 hours after the procedure. Remove the bandage (dressing) and gently wash the site with plain soap and water. Gently pat the site dry.   Do not apply powder or lotion to the site.   Do not sit in a bathtub, swimming pool, or whirlpool for 5 to 7 days.   No bending, squatting, or lifting anything over 10 pounds (4.5 kg) as directed by your caregiver.   Inspect the site at least twice daily.   Do not drive home if you are discharged the same day of the procedure. Have someone else drive you.   You may drive 24 hours after the procedure unless otherwise instructed by your caregiver.  What to expect:  Any bruising will usually fade within 1 to 2 weeks.   Blood that collects in the tissue (hematoma) may be painful to the touch. It should usually decrease in size and tenderness within 1 to 2 weeks.  SEEK IMMEDIATE MEDICAL CARE IF:  You have unusual pain at the groin site or down the affected leg.   You have redness, warmth, swelling, or pain at the groin site.   You have drainage (other than a small amount of blood on the dressing).   You have chills.   You have a fever or persistent symptoms for more than 72 hours.   You have a fever and your symptoms suddenly get worse.   Your leg becomes pale, cool, tingly, or numb.   You have heavy bleeding from the site. Hold pressure on the site.  Document Released: 06/15/2010 Document Revised: 05/02/2011 Document Reviewed: 06/15/2010 [facility] Patient Information 2012 [place], [place].  Cardiac Diet This diet can  help prevent heart disease and stroke. Many factors influence your heart health, including eating and exercise habits. Coronary risk rises a lot with abnormal blood fat (lipid) levels. Cardiac meal planning includes limiting unhealthy fats, increasing healthy fats, and making other small dietary changes. General guidelines are as follows:  Adjust calorie intake to reach and maintain desirable body weight.   Limit total fat intake to less than 30% of total calories. Saturated fat should be less than 7% of calories.   Saturated fats are found in animal products and in some vegetable products. Saturated vegetable fats are found in coconut oil, cocoa butter, palm oil, and palm kernel oil. Read labels carefully to avoid these products as much as possible. Use butter in moderation. Choose tub margarines and oils that have 2 grams of fat or less. Good cooking oils are canola and olive oils.   Practice low-fat cooking techniques. Do not fry food. Instead, broil, bake, boil, steam, grill, roast on a rack, stir-fry, or microwave it. Other fat reducing suggestions include:   Remove the skin from poultry.   Remove all visible fat from meats.   Skim the fat off stews, soups, and gravies before serving them.   Steam vegetables in water or broth instead of sauting them in fat.   Avoid foods with trans fat (or hydrogenated oils), such as commercially fried foods and commercially baked goods. Commercial shortening and deep-frying fats will contain trans fat.  Increase intake of fruits, vegetables, whole grains, and legumes to replace foods high in fat.   Increase consumption of nuts, legumes, and seeds to at least 4 servings weekly. One serving of a legume equals  cup, and 1 serving of nuts or seeds equals  cup.   Choose whole grains more often. Have 3 servings per day (a serving is 1 ounce [oz]).   Have at least 4 cups of fruit and vegetable a day.   Increase your intake of soluble fiber to 10 to 25  grams per day. Soluble fiber binds cholesterol to be removed from the blood. Foods high in soluble fiber are dried beans, citrus fruits, oats, apples, bananas, broccoli, Brussels sprouts, and eggplant.   Try to include foods fortified with plant sterols or stanols, such as yogurt, breads, juices, or margarines. Choose several fortified foods to achieve a daily intake of 2 to 3 grams of plant sterols or stanols.   Foods with omega-3 fats can help reduce your risk of heart disease. Aim to have a 3.5 oz portion of fatty fish twice per week, such as salmon, mackerel, albacore tuna, sardines, lake trout, or herring. If you wish to take a fish oil supplement, choose one that contains 1 gram of both DHA and EPA.   Limit processed meats to 2 servings (3 oz portion) weekly.   Limit the sodium in your diet to 1500 milligrams (mg) per day. If you have high blood pressure, talk to a registered dietitian about a DASH (Dietary Approaches to Stop Hypertension) eating plan.   Limit beverages with added sugar, such as soda, to no more than 36 ounces per week.  CHOOSING FOODS Starches  Allowed: Breads: All kinds (wheat, rye, raisin, white, oatmeal, [place], [place], and English muffin bread). Low-fat rolls: English muffins, frankfurter and hamburger buns, bagels, pita bread, tortillas (not fried). Pancakes, waffles, biscuits, and muffins made with recommended oil.   Avoid: Products made with saturated or trans fats, oils, or whole milk products. Butter rolls, cheese breads, croissants. Commercial doughnuts, muffins, sweet rolls, biscuits, waffles, pancakes, store-bought mixes.  Crackers  Allowed: Low-fat crackers and snacks: Animal, graham, rye, saltine (with recommended oil, no lard), oyster, and matzo crackers. Bread sticks, melba toast, rusks, flatbread, pretzels, and light popcorn.   Avoid: High-fat crackers: cheese crackers, butter crackers, and those made with coconut, palm oil, or trans fat (hydrogenated  oils). Buttered popcorn.  Cereals  Allowed: Hot or cold whole-grain cereals.   Avoid: Cereals containing coconut, hydrogenated vegetable fat, or animal fat.  Potatoes / Pasta / Rice  Allowed: All kinds of potatoes, rice, and pasta (such as macaroni, spaghetti, and noodles).   Avoid: Pasta or rice prepared with cream sauce or high-fat cheese. Chow mein noodles, [place] fries.  Vegetables  Allowed: All vegetables and vegetable juices.   Avoid: Fried vegetables. Vegetables in cream, butter, or high-fat cheese sauces. Limit coconut. Fruit in cream or custard.  Meat and Meat Substitutes  Allowed: Limit your intake of meat, seafood, and poultry to no more than 6 oz (cooked weight) per day. All lean, well-trimmed beef, veal, pork, and lamb. All chicken and [place] without skin. All fish and shellfish. Wild game: wild duck, rabbit, pheasant, and venison. Meatless dishes: recipes with dried beans, peas, lentils, and tofu (soybean curd). Seeds and nuts: all seeds and most nuts.   Avoid: Prime grade and other heavily marbled and fatty meats, such as short ribs, spare ribs, rib eye roast or steak, frankfurters, sausage, bacon, and high-fat  luncheon meats, mutton. Caviar. Commercially fried fish. Domestic duck, goose, venison sausage. Organ meats: liver, gizzard, heart, chitterlings, brains, kidney, sweetbreads.  Dairy  Allowed: Egg whites or low-cholesterol egg substitutes may be used as desired. Low-fat cheeses: nonfat or low-fat cottage cheese (1% or 2% fat), cheeses made with part skim milk, such as mozzarella, farmers, string, or ricotta. (Cheeses should be labeled no more than 2 to 6 grams fat per oz.)   Avoid: Whole milk cheeses, including colby, cheddar, [place], [address], [place], [place], [place], [address], Swiss, and blue. Creamed cottage cheese, cream cheese.  Milk  Allowed: Skim (or 1%) milk: liquid, powdered, or evaporated. Buttermilk made with low-fat milk. Drinks made with skim or  low-fat milk or cocoa. Chocolate milk or cocoa made with skim or low-fat (1%) milk. Nonfat or low-fat yogurt.   Avoid: Whole milk and whole milk products, including buttermilk or yogurt made from whole milk, drinks made from whole milk. Condensed milk, evaporated whole milk, and 2% milk.  Soups and Combination Foods  Allowed: Low-fat low-sodium soups: broth, dehydrated soups, homemade broth, soups with the fat removed, homemade cream soups made with skim or low-fat milk. Low-fat spaghetti, lasagna, chili, and Spanish rice if low-fat ingredients and low-fat cooking techniques are used.   Avoid: Cream soups made with whole milk, cream, or high-fat cheese. All other soups.  Desserts and Sweets  Allowed: Sherbet, fruit ices, gelatins, meringues, and angel food cake. Homemade desserts with recommended fats, oils, and milk products. Jam, jelly, honey, marmalade, sugars, and syrups. Pure sugar candy, such as gum drops, hard candy, jelly beans, marshmallows, mints, and small amounts of dark chocolate.   Avoid: Commercially prepared cakes, pies, cookies, frosting, pudding, or mixes for these products. Desserts containing whole milk products, chocolate, coconut, lard, palm oil, or palm kernel oil. Ice cream or ice cream drinks. Candy that contains chocolate, coconut, butter, hydrogenated fat, or unknown ingredients. Buttered syrups.  Fats and Oils  Allowed: Vegetable oils: safflower, sunflower, corn, soybean, cottonseed, sesame, canola, olive, or peanut. Non-hydrogenated margarines. Salad dressing or mayonnaise: homemade or commercial, made with a recommended oil. Low or nonfat salad dressing or mayonnaise.   Limit added fats and oils to 6 to 8 tsp per day (includes fats used in cooking, baking, salads, and spreads on bread). Remember to count the "hidden fats" in foods.   Avoid: Solid fats and shortenings: butter, lard, salt pork, bacon drippings. Gravy containing meat fat, shortening, or suet. Cocoa  butter, coconut. Coconut oil, palm oil, palm kernel oil, or hydrogenated oils: these ingredients are often used in bakery products, nondairy creamers, whipped toppings, candy, and commercially fried foods. Read labels carefully. Salad dressings made of unknown oils, sour cream, or cheese, such as blue cheese and Roquefort. Cream, all kinds: half-and-half, light, heavy, or whipping. Sour cream or cream cheese (even if "light" or low-fat). Nondairy cream substitutes: coffee creamers and sour cream substitutes made with palm, palm kernel, hydrogenated oils, or coconut oil.  Beverages  Allowed: Coffee (regular or decaffeinated), tea. diet carbonated beverages, mineral water. Alcohol: Check with your caregiver. Moderation is recommended.   Avoid: Whole milk, regular sodas, and juice drinks with added sugar.  Condiments  Allowed: All seasonings and condiments. Cocoa powder. "Cream" sauces made with recommended ingredients.   Avoid: Carob powder made with hydrogenated fats.  SAMPLE MENU Breakfast   cup orange juice    cup oatmeal   1 slice toast   1 tsp margarine   1 cup skim milk  Lunch  [place] sandwich with 2 oz [place], 2 slices bread   Lettuce and tomato slices   Fresh fruit   Carrot sticks   Coffee or tea   Snack   Fresh fruit or low-fat crackers  Dinner  3 oz lean ground beef   1 baked potato   1 tsp margarine    cup asparagus   Lettuce salad   1 tbs non-creamy dressing    cup peach slices   1 cup skim milk  Document Released: 02/20/2008 Document Revised: 05/02/2011 Document Reviewed: 05/15/2010 [facility] Patient Information 2012 [place], [place].

## 2011-11-22 ENCOUNTER — Encounter: Payer: Self-pay | Admitting: Internal Medicine

## 2011-11-23 ENCOUNTER — Other Ambulatory Visit: Payer: Self-pay | Admitting: Internal Medicine

## 2011-11-23 DIAGNOSIS — K219 Gastro-esophageal reflux disease without esophagitis: Secondary | ICD-10-CM

## 2011-11-25 ENCOUNTER — Encounter: Payer: Self-pay | Admitting: Gastroenterology

## 2011-11-25 ENCOUNTER — Telehealth: Payer: Self-pay | Admitting: Gastroenterology

## 2011-11-25 NOTE — Telephone Encounter (Signed)
Patient rescheduled for 11/26/11 2:00 with Willette Cluster RNP

## 2011-11-26 ENCOUNTER — Ambulatory Visit (INDEPENDENT_AMBULATORY_CARE_PROVIDER_SITE_OTHER): Payer: BC Managed Care – PPO | Admitting: Nurse Practitioner

## 2011-11-26 ENCOUNTER — Encounter: Payer: Self-pay | Admitting: Nurse Practitioner

## 2011-11-26 VITALS — BP 132/74 | HR 80 | Ht 66.0 in | Wt 170.2 lb

## 2011-11-26 DIAGNOSIS — R079 Chest pain, unspecified: Secondary | ICD-10-CM

## 2011-11-26 MED ORDER — SUCRALFATE 1 GM/10ML PO SUSP: 1.0000 g | Freq: Four times a day (QID) | ORAL | Status: DC

## 2011-11-26 NOTE — Progress Notes (Signed)
11/26/2011 [name] [name] [id number] [birth year]   HISTORY OF PRESENT ILLNESS: Patient is a 56 year old female with multiple medical problems known to Dr. Jarold Motto for irritable bowel syndrome. She had a normal colonoscopy with                  Dr. Jarold Motto January 2009. Patient has not been seen here in several years. Patient's primary care doctor wanted her evaluated for noncardiac chest pain. She had an upper endoscopy with Dr. Jarold Motto January 2009 for chest pain. Exam was normal. There was a question of whether esophageal spasms were playing a part in her pain, she was given a trial of Levsin following the EGD (she doesn't remember taking it). Patient's current chest pain is completely different than that in 2009. This is more of a pressure, feels like something standing on chest. Pain occurs after eating, regardless of what she eats. No dysphasia. Last week the chest pressure was so severe patient went to ED. She had a negative cardiac cath with  Dr. Tenny Craw.   Patient was diagnosed with asthma last year. She has had some mild coughing and wheezing but attributes that to asthma. Over the last few weeks she has been having breakthrough GERD symptoms, especailly at night. She sleeps propped up and has been on a daily PPI for several years.        Past Medical History  Diagnosis Date  . Hypothyroidism   . Diabetes mellitus type II   . Fatty liver     abnormal transaminases; negative work up  . Atypical chest pain     normal coronaries and LV function by cath 10/2011  . GERD (gastroesophageal reflux disease)   . Osteoarthritis     chronic, right knee  . Plantar fasciitis   . Chronic pain disorder     neck and shoulder  . HTN (hypertension)   . HLD (hyperlipidemia)   . Asthma    Past Surgical History  Procedure Date  . Cholecystectomy   . Tonsillectomy 1975  . Knee arthroscopy 1998, 2007  . Vesicovaginal fistula closure w/ tah 1995  . Abdominal hysterectomy 1982    reports that she  has never smoked. She has never used smokeless tobacco. She reports that she does not drink alcohol or use illicit drugs. family history includes Alzheimer's disease in her mother; Diabetes type II in her mother; Emphysema in her father; and Hiatal hernia in her mother. Allergies  Allergen Reactions  . Epinephrine Other (See Comments)    Pass out   . Simvastatin     REACTION: back ache      Outpatient Encounter Prescriptions as of 11/26/2011  Medication Sig Dispense Refill  . albuterol (PROVENTIL,VENTOLIN) 90 MCG/ACT inhaler Inhale 2 puffs into the lungs every 6 (six) hours as needed. For shortness of breath/wheezing      . Ascorbic Acid (VITAMIN C) 500 MG tablet Take 500 mg by mouth daily.        Marland Kitchen aspirin 81 MG tablet Take 81 mg by mouth daily.        . calcium-vitamin D (OSCAL WITH D) 500-200 MG-UNIT per tablet Take 1 tablet by mouth every evening.      Marland Kitchen dexlansoprazole (DEXILANT) 60 MG capsule Take 60 mg by mouth daily.      . Fluticasone-Salmeterol (ADVAIR) 250-50 MCG/DOSE AEPB Inhale 1 puff into the lungs 2 (two) times daily.      Marland Kitchen glimepiride (AMARYL) 2 MG tablet Take 2 mg by mouth every morning.      Marland Kitchen  Glucosamine-Chondroitin (OSTEO BI-FLEX REGULAR STRENGTH) 250-200 MG TABS Take 1 tablet by mouth 2 (two) times daily.       . hydrochlorothiazide (MICROZIDE) 12.5 MG capsule Take 1 capsule (12.5 mg total) by mouth every morning.  90 capsule  1  . levothyroxine (SYNTHROID, LEVOTHROID) 88 MCG tablet Take 1 tablet (88 mcg total) by mouth daily.  90 tablet  1  . rosuvastatin (CRESTOR) 10 MG tablet Take 10 mg by mouth once a week. Takes on wednesdays      . sitaGLIPtan-metformin (JANUMET) 50-500 MG per tablet Take 1 tablet by mouth 2 (two) times daily with a meal. Hold for 48hrs after heart procedure. May resume on 11/24/11.      Marland Kitchen VITAMIN E PO Take 1 capsule by mouth daily.         REVIEW OF SYSTEMS  : Positive for sinus trouble, heart rhythm changes and urine leakage. All other systems  reviewed and negative except where noted in the History of Present Illness.   PHYSICAL EXAM: BP 132/74  Pulse 80  Ht 5\' 6"  (1.676 m)  Wt 170 lb 3.2 oz (77.202 kg)  BMI 27.47 kg/m2 General: Well developed white female in no acute distress Head: Normocephalic and atraumatic Eyes:  sclerae anicteric,conjunctive pink. Ears: Normal auditory acuity Mouth: No deformity or lesions Neck: Supple, no masses.  Lungs: Clear throughout to auscultation Heart: Regular rate and rhythm; no murmurs heard Abdomen: Soft, non distended, nontender. No masses or hepatomegaly noted. Normal Bowel sounds Musculoskeletal: Symmetrical with no gross deformities  Skin: No lesions on visible extremities Extremities: No edema or deformities noted Neurological: Alert oriented x 4, grossly nonfocal Cervical Nodes:  No significant cervical adenopathy Psychological:  Alert and cooperative. Normal mood and affect  ASSESSMENT AND PLAN;  1. Post-prandial non-cardiac chest pain ("pressure"). Patient on chronic PPI but esophagitis still possible. Though symptoms atypical, esophageal spasm not ruled out. She has localized tenderness over sternum between breasts raising question of musculoskeletal pain but not sure why that would cause post-prandial pain. Could chest "pressure" related to her asthma??.For further evaluation patient will be scheduled for upper endoscopy. The benefits, risks, and potential complications of EGD with possible biopsies and/or dilation were discussed with the patient and she agrees to proceed.   2.IBS, bowel movements have actually been normal over last few years.   3. Asthma, diagnosed last year  4. Diabetes

## 2011-11-26 NOTE — Patient Instructions (Addendum)
We sent prescription for Carfate to [facility] [place]. Raise the head of your bed by 6 inches.  GERD literature provided.  Increase the Dexilant 60 mg to twice daily, 30 min before breakfast and dinner. We scheduled the Endoscopy with Dr. Sheryn Bison. Upper GI Endoscopy Upper GI endoscopy means using a flexible scope to look at the esophagus, stomach, and upper small bowel. This is done to make a diagnosis in people with heartburn, abdominal pain, or abnormal bleeding. Sometimes an endoscope is needed to remove foreign bodies or food that become stuck in the esophagus; it can also be used to take biopsy samples. For the best results, do not eat or drink for 8 hours before having your upper endoscopy.  To perform the endoscopy, you will probably be sedated and your throat will be numbed with a special spray. The endoscope is then slowly passed down your throat (this will not interfere with your breathing). An endoscopy exam takes 15 to 30 minutes to complete and there is no real pain. Patients rarely remember much about the procedure. The results of the test may take several days if a biopsy or other test is taken.  You may have a sore throat after an endoscopy exam. Serious complications are very rare. Stick to liquids and soft foods until your pain is better. Do not drive a car or operate any dangerous equipment for at least 24 hours after being sedated. SEEK IMMEDIATE MEDICAL CARE IF:   You have severe throat pain.   You have shortness of breath.   You have bleeding problems.   You have a fever.   You have difficulty recovering from your sedation.  Document Released: 06/20/2004 Document Revised: 05/02/2011 Document Reviewed: 05/15/2008

## 2011-11-27 ENCOUNTER — Ambulatory Visit
Admission: RE | Admit: 2011-11-27 | Discharge: 2011-11-27 | Disposition: A | Discharge status: Home / Self Care | Payer: BC Managed Care – PPO | Source: Ambulatory Visit | Attending: Internal Medicine | Admitting: Internal Medicine

## 2011-11-27 DIAGNOSIS — Z1231 Encounter for screening mammogram for malignant neoplasm of breast: Secondary | ICD-10-CM

## 2011-12-02 NOTE — Progress Notes (Signed)
I AGREE WITH ASSESSMENT AND PLANS 

## 2011-12-03 ENCOUNTER — Other Ambulatory Visit: Payer: Self-pay | Admitting: Internal Medicine

## 2011-12-03 DIAGNOSIS — R928 Other abnormal and inconclusive findings on diagnostic imaging of breast: Secondary | ICD-10-CM

## 2011-12-09 ENCOUNTER — Telehealth: Payer: Self-pay | Admitting: *Deleted

## 2011-12-09 ENCOUNTER — Encounter: Payer: Self-pay | Admitting: Gastroenterology

## 2011-12-09 ENCOUNTER — Ambulatory Visit (AMBULATORY_SURGERY_CENTER): Payer: BC Managed Care – PPO | Admitting: Gastroenterology

## 2011-12-09 VITALS — BP 137/76 | HR 92 | Temp 97.0°F | Resp 17 | Ht 66.0 in | Wt 170.0 lb

## 2011-12-09 DIAGNOSIS — K219 Gastro-esophageal reflux disease without esophagitis: Secondary | ICD-10-CM

## 2011-12-09 DIAGNOSIS — K3184 Gastroparesis: Secondary | ICD-10-CM

## 2011-12-09 DIAGNOSIS — K3189 Other diseases of stomach and duodenum: Secondary | ICD-10-CM

## 2011-12-09 DIAGNOSIS — K209 Esophagitis, unspecified: Secondary | ICD-10-CM

## 2011-12-09 DIAGNOSIS — R079 Chest pain, unspecified: Secondary | ICD-10-CM

## 2011-12-09 DIAGNOSIS — D131 Benign neoplasm of stomach: Secondary | ICD-10-CM

## 2011-12-09 LAB — GLUCOSE, CAPILLARY
Glucose-Capillary: 140 mg/dL — ABNORMAL HIGH (ref 70–99)
Glucose-Capillary: 127 mg/dL — ABNORMAL HIGH (ref 70–99)

## 2011-12-09 MED ORDER — SODIUM CHLORIDE 0.9 % IV SOLN: 500.0000 mL | INTRAVENOUS | Status: DC

## 2011-12-09 NOTE — Progress Notes (Signed)
Patient did not experience any of the following events: a burn prior to discharge; a fall within the facility; wrong site/side/patient/procedure/implant event; or a hospital transfer or hospital admission upon discharge from the facility. (G8907) Patient did not have preoperative order for IV antibiotic SSI prophylaxis. (G8918)  

## 2011-12-09 NOTE — Telephone Encounter (Signed)
Scheduled pt for Gastric Emptying Scan at [facility] for 12/17/11 at 10am; needs to arrive at 0945am at [facility] Radiology desk. She needs to be NPO after midnight and she also needs to hold Carafate and Dexilant after midnight on 12/16/11. Given to [facility] staff and explained to pt.

## 2011-12-09 NOTE — Patient Instructions (Signed)
CONTINUE YOUR MEDICATIONS.  GASTRIC EMPTYING STUDY, 12/17/11 ARRIVE AT 0945. HOLD CARAFATE AND DEXILANT THAT MORNING.NOTHING TO EAT OR DRINK AFTER MIDNIGHT.  YOU HAD AN ENDOSCOPIC PROCEDURE TODAY AT THE [facility]: Refer to the procedure report that was given to you for any specific questions about what was found during the examination.  If the procedure report does not answer your questions, please call your gastroenterologist to clarify.  If you requested that your care partner not be given the details of your procedure findings, then the procedure report has been included in a sealed envelope for you to review at your convenience later.  YOU SHOULD EXPECT: Some feelings of bloating in the abdomen. Passage of more gas than usual.  Walking can help get rid of the air that was put into your GI tract during the procedure and reduce the bloating. If you had a lower endoscopy (such as a colonoscopy or flexible sigmoidoscopy) you may notice spotting of blood in your stool or on the toilet paper. If you underwent a bowel prep for your procedure, then you may not have a normal bowel movement for a few days.  DIET: Your first meal following the procedure should be a light meal and then it is ok to progress to your normal diet.  A half-sandwich or bowl of soup is an example of a good first meal.  Heavy or fried foods are harder to digest and may make you feel nauseous or bloated.  Likewise meals heavy in dairy and vegetables can cause extra gas to form and this can also increase the bloating.  Drink plenty of fluids but you should avoid alcoholic beverages for 24 hours.  ACTIVITY: Your care partner should take you home directly after the procedure.  You should plan to take it easy, moving slowly for the rest of the day.  You can resume normal activity the day after the procedure however you should NOT DRIVE or use heavy machinery for 24 hours (because of the sedation medicines used during the test).     SYMPTOMS TO REPORT IMMEDIATELY: A gastroenterologist can be reached at any hour.  During normal business hours, 8:30 AM to 5:00 PM Monday through Friday, call [phone].  After hours and on weekends, please call the GI answering service at [phone] who will take a message and have the physician on call contact you.   Following lower endoscopy (colonoscopy or flexible sigmoidoscopy):  Excessive amounts of blood in the stool  Significant tenderness or worsening of abdominal pains  Swelling of the abdomen that is new, acute  Fever of 100F or higher  Following upper endoscopy (EGD)  Vomiting of blood or coffee ground material  New chest pain or pain under the shoulder blades  Painful or persistently difficult swallowing  New shortness of breath  Fever of 100F or higher  Black, tarry-looking stools  FOLLOW UP: If any biopsies were taken you will be contacted by phone or by letter within the next 1-3 weeks.  Call your gastroenterologist if you have not heard about the biopsies in 3 weeks.  Our staff will call the home number listed on your records the next business day following your procedure to check on you and address any questions or concerns that you may have at that time regarding the information given to you following your procedure. This is a courtesy call and so if there is no answer at the home number and we have not heard from you through the  emergency physician on call, we will assume that you have returned to your regular daily activities without incident.  SIGNATURES/CONFIDENTIALITY: You and/or your care partner have signed paperwork which will be entered into your electronic medical record.  These signatures attest to the fact that that the information above on your After Visit Summary has been reviewed and is understood.  Full responsibility of the confidentiality of this discharge information lies with you and/or your care-partner.

## 2011-12-09 NOTE — Op Note (Signed)
[facility] [address] [place], [place]  [address]  ENDOSCOPY PROCEDURE REPORT  PATIENT:  [name], [name]  MR#:  [id number] BIRTHDATE:  1955/11/24, 56 yrs. old  GENDER:  female  ENDOSCOPIST:  Vania Rea. Jarold Motto, MD, [facility] Referred by:  PROCEDURE DATE:  12/09/2011 PROCEDURE:  EGD with biopsy, 43239, EGD with biopsy for H. pylori 21308 ASA CLASS:  Class II INDICATIONS:  ATYPICAL PAIN AND REGURGITATION.  MEDICATIONS:   propofol (Diprivan) 150 mg IV, glycopyrrolate (Robinal) 0.2 mg IV TOPICAL ANESTHETIC:  Cetacaine Spray  DESCRIPTION OF PROCEDURE:   After the risks and benefits of the procedure were explained, informed consent was obtained.  The [facility] GIF-[id number] endoscope was introduced through the mouth and advanced to the second portion of the duodenum.  The instrument was slowly withdrawn as the mucosa was fully examined. <<PROCEDUREIMAGES>>  Moderate gastritis was found in the body and the antrum of the stomach. CLO BX. DONE.SOME FOOD AND BILE IN STOMACH.  The duodenal bulb was normal in appearance, as was the postbulbar duodenum. "SALT AND PEPPER" APPEARANCE NOTED.  There were multiple polyps identified. in the fundus. NIMEROUS 2-3 MM FLAT POLYPS BIOPSIED.NO HH NOTED !!!  The esophagus and gastroesophageal junction were completely normal in appearance. BIOPSIES DONE.    NO HIATIAL HERNIA NOTED  The scope was then withdrawn from the patient and the procedure completed.  COMPLICATIONS:  None  ENDOSCOPIC IMPRESSION: 1) Moderate gastritis in the body and the antrum of the stomach  2) Normal duodenum 3) Polyps, multiple in the fundus 4) Normal esophagus 1.GASTROPARESIS 2.R/O H.PYLORI 3.R/O EOSINOPHILIC ESOPHAGITIS 4.GASTRIC POLYPS FROM PPI USE RECOMMENDATIONS: 1) Await biopsy results 2) Rx CLO if positive 3) continue PPI SCHEDULE GASTRIC EMPTYING SCAN.PROBABLY WILL NEED DOMPERIDOME RX.  ______________________________ Vania Rea. Jarold Motto, MD,  Clement[facility] Graham  CC:  Clifton Custard MD, Willette Cluster ACNP-BC  n. eSIGNED:   Vania Rea. Patterson at 12/09/2011 09:53 AM  Vara Guardian, [phone]

## 2011-12-10 ENCOUNTER — Telehealth: Payer: Self-pay | Admitting: *Deleted

## 2011-12-10 NOTE — Telephone Encounter (Signed)
  Follow up Call-  Call back number 12/09/2011  Post procedure Call Back phone  # [phone]  Permission to leave phone message Yes     Patient questions:  Do you have a fever, pain , or abdominal swelling? no Pain Score  0 *  Have you tolerated food without any problems? yes  Have you been able to return to your normal activities? yes  Do you have any questions about your discharge instructions: Diet   no Medications  no Follow up visit  no  Do you have questions or concerns about your Care? no  Actions: * If pain score is 4 or above: No action needed, pain <4.

## 2011-12-13 ENCOUNTER — Encounter: Payer: Self-pay | Admitting: Gastroenterology

## 2011-12-16 ENCOUNTER — Ambulatory Visit
Admission: RE | Admit: 2011-12-16 | Discharge: 2011-12-16 | Disposition: A | Discharge status: Home / Self Care | Payer: BC Managed Care – PPO | Source: Ambulatory Visit | Attending: Internal Medicine | Admitting: Internal Medicine

## 2011-12-16 ENCOUNTER — Ambulatory Visit
Admission: RE | Admit: 2011-12-16 | Discharge: 2011-12-16 | Disposition: A | Discharge status: Home / Self Care | Payer: BC Managed Care – PPO | Source: Ambulatory Visit

## 2011-12-16 DIAGNOSIS — R928 Other abnormal and inconclusive findings on diagnostic imaging of breast: Secondary | ICD-10-CM

## 2011-12-17 ENCOUNTER — Encounter (HOSPITAL_COMMUNITY)
Admission: RE | Admit: 2011-12-17 | Discharge: 2011-12-17 | Disposition: A | Discharge status: Home / Self Care | Payer: BC Managed Care – PPO | Source: Ambulatory Visit | Attending: Gastroenterology | Admitting: Gastroenterology

## 2011-12-17 DIAGNOSIS — E119 Type 2 diabetes mellitus without complications: Secondary | ICD-10-CM | POA: Insufficient documentation

## 2011-12-17 DIAGNOSIS — R142 Eructation: Secondary | ICD-10-CM | POA: Insufficient documentation

## 2011-12-17 DIAGNOSIS — IMO0001 Reserved for inherently not codable concepts without codable children: Secondary | ICD-10-CM

## 2011-12-17 DIAGNOSIS — R6881 Early satiety: Secondary | ICD-10-CM | POA: Insufficient documentation

## 2011-12-17 DIAGNOSIS — K3189 Other diseases of stomach and duodenum: Secondary | ICD-10-CM

## 2011-12-17 DIAGNOSIS — R141 Gas pain: Secondary | ICD-10-CM | POA: Insufficient documentation

## 2011-12-17 MED ORDER — TECHNETIUM TC 99M SULFUR COLLOID
2.0000 | Freq: Once | INTRAVENOUS | Status: AC | PRN
  Administered 2011-12-17: 2 via INTRAVENOUS

## 2011-12-18 ENCOUNTER — Ambulatory Visit: Payer: BC Managed Care – PPO | Admitting: Internal Medicine

## 2011-12-19 ENCOUNTER — Telehealth: Payer: Self-pay | Admitting: Gastroenterology

## 2011-12-19 ENCOUNTER — Ambulatory Visit: Payer: BC Managed Care – PPO | Admitting: Gastroenterology

## 2011-12-19 MED ORDER — CILIDINIUM-CHLORDIAZEPOXIDE 2.5-5 MG PO CAPS: ORAL_CAPSULE | ORAL | Status: DC

## 2011-12-19 NOTE — Telephone Encounter (Signed)
Notes Recorded by Linna Hoff, RN on 12/17/2011 at 4:12 PM Dr Jarold Motto, can you clarify the Librax order? On EGD, you mentioned maybe starting Domperidone for retained food in stomach. Thanks. ------  Notes Recorded by Mardella Layman, MD on 12/17/2011 at 1:38 PM Normal manometry and normal endoscopy. Please prescribed Librax 3 times a day while continuing PPI with when necessary followup.

## 2011-12-19 NOTE — Telephone Encounter (Signed)
Informed pt of normal GES per Dr Jarold Motto and to order Librax tid ac. Pt will discuss this with her PCP and requested we go ahead and order the librax.

## 2011-12-19 NOTE — Telephone Encounter (Signed)
Normal,therefore we will try librax tid ac

## 2011-12-22 ENCOUNTER — Encounter: Payer: Self-pay | Admitting: Internal Medicine

## 2011-12-23 MED ORDER — SITAGLIPTIN PHOS-METFORMIN HCL 50-500 MG PO TABS: 1.0000 | ORAL_TABLET | Freq: Two times a day (BID) | ORAL | Status: DC

## 2011-12-30 ENCOUNTER — Other Ambulatory Visit: Payer: Self-pay | Admitting: Internal Medicine

## 2011-12-30 ENCOUNTER — Ambulatory Visit: Admission: RE | Admit: 2011-12-30 | Payer: BC Managed Care – PPO | Source: Ambulatory Visit

## 2011-12-30 ENCOUNTER — Ambulatory Visit
Admission: RE | Admit: 2011-12-30 | Discharge: 2011-12-30 | Disposition: A | Discharge status: Home / Self Care | Payer: BC Managed Care – PPO | Source: Ambulatory Visit | Attending: Internal Medicine | Admitting: Internal Medicine

## 2011-12-30 DIAGNOSIS — N63 Unspecified lump in unspecified breast: Secondary | ICD-10-CM

## 2011-12-30 DIAGNOSIS — R928 Other abnormal and inconclusive findings on diagnostic imaging of breast: Secondary | ICD-10-CM

## 2012-01-03 ENCOUNTER — Encounter: Payer: Self-pay | Admitting: Internal Medicine

## 2012-01-03 ENCOUNTER — Ambulatory Visit (INDEPENDENT_AMBULATORY_CARE_PROVIDER_SITE_OTHER): Payer: BC Managed Care – PPO | Admitting: Internal Medicine

## 2012-01-03 ENCOUNTER — Telehealth: Payer: Self-pay | Admitting: Internal Medicine

## 2012-01-03 VITALS — BP 128/76 | HR 94 | Temp 97.8°F | Resp 16 | Ht 66.0 in | Wt 170.8 lb

## 2012-01-03 DIAGNOSIS — E039 Hypothyroidism, unspecified: Secondary | ICD-10-CM

## 2012-01-03 DIAGNOSIS — E119 Type 2 diabetes mellitus without complications: Secondary | ICD-10-CM

## 2012-01-03 DIAGNOSIS — E785 Hyperlipidemia, unspecified: Secondary | ICD-10-CM

## 2012-01-03 DIAGNOSIS — R079 Chest pain, unspecified: Secondary | ICD-10-CM

## 2012-01-03 DIAGNOSIS — Z862 Personal history of diseases of the blood and blood-forming organs and certain disorders involving the immune mechanism: Secondary | ICD-10-CM

## 2012-01-03 LAB — LIPID PANEL
HDL: 46 mg/dL (ref >39)
LDL Cholesterol: 127 mg/dL — ABNORMAL HIGH (ref 0–99)
Triglycerides: 169 mg/dL — ABNORMAL HIGH (ref <150)
VLDL: 34 mg/dL (ref 0–40)

## 2012-01-03 MED ORDER — GLIMEPIRIDE 4 MG PO TABS: 4.0000 mg | ORAL_TABLET | Freq: Every day | ORAL | Status: DC

## 2012-01-03 NOTE — Patient Instructions (Signed)
Please schedule labs prior to next visit Chem7, a1c-250.0 and lft-abn lft

## 2012-01-03 NOTE — Progress Notes (Signed)
  Subjective:    Patient ID: [name] [name], female    DOB: Sep 09, 1955, 56 y.o.   MRN: [id number]  HPI Pt presents to clinic for followup of multiple medical problems. Since last visit underwent nl cardiac catheterization secondary to CP. S/p GI evaluation with nl EGD, manometry and empyting study. CP improved with diet changes but now eating more carbs and fsbs upper 100's without hypoglycemia.    Past Medical History  Diagnosis Date  . Hypothyroidism   . Diabetes mellitus type II   . Fatty liver     abnormal transaminases; negative work up  . Atypical chest pain     normal coronaries and LV function by cath 10/2011  . GERD (gastroesophageal reflux disease)   . Osteoarthritis     chronic, right knee  . Plantar fasciitis   . Chronic pain disorder     neck and shoulder  . HTN (hypertension)   . HLD (hyperlipidemia)   . Asthma    Past Surgical History  Procedure Date  . Cholecystectomy   . Tonsillectomy 1975  . Knee arthroscopy 1998, 2007  . Vesicovaginal fistula closure w/ tah 1995  . Abdominal hysterectomy 1982    reports that she has never smoked. She has never used smokeless tobacco. She reports that she does not drink alcohol or use illicit drugs. family history includes Alzheimer's disease in her mother; Diabetes type II in her mother; Emphysema in her father; and Hiatal hernia in her mother.  There is no history of Colon cancer. Allergies  Allergen Reactions  . Epinephrine Other (See Comments)    Pass out   . Simvastatin     REACTION: back ache      Review of Systems see hpi     Objective:   Physical Exam  Nursing note and vitals reviewed. Constitutional: She appears well-developed and well-nourished. No distress.  HENT:  Head: Normocephalic.  Neurological: She is alert.  Skin: She is not diaphoretic.  Psychiatric: She has a normal mood and affect.          Assessment & Plan:

## 2012-01-03 NOTE — Telephone Encounter (Signed)
Please schedule labs prior to next visit  Chem7, a1c-250.0 and lft-abn lft   Patient has upcoming appointment in November/2013. She states that she will go to the [facility] lab.

## 2012-01-03 NOTE — Assessment & Plan Note (Signed)
Obtain tsh  

## 2012-01-03 NOTE — Assessment & Plan Note (Signed)
With associated GERD. Workup to date neg. Interested in second opinion. Schedule GI consult

## 2012-01-03 NOTE — Assessment & Plan Note (Signed)
Obtain lipid profile. 

## 2012-01-03 NOTE — Assessment & Plan Note (Signed)
suboptimal control. Increase amaryl 4mg  qd. Obtain a1c

## 2012-01-08 ENCOUNTER — Telehealth: Payer: Self-pay | Admitting: *Deleted

## 2012-01-08 DIAGNOSIS — E785 Hyperlipidemia, unspecified: Secondary | ICD-10-CM

## 2012-01-08 NOTE — Telephone Encounter (Signed)
Patient reports that she is on strict carbohydrate diet by Dr. Jarold Motto, no fruits and/or vegetables allowed; she has an appointment scheduled with GI outside of our network on 09.23.13 and cannot make any changes to her diet until then, states she does expect to see these numbers continue to worsen in the meantime. Patient also reports that she can only take her Crestor [10 mg] Once Weekly, due to severe muscle side effects. Patient will do repeat labs 4-6 wks out; future orders placed in EMR/SLS

## 2012-01-08 NOTE — Telephone Encounter (Signed)
Message copied by Regis Bill on Wed Jan 08, 2012  4:35 PM ------      Message from: Edwyna Perfect      Created: Wed Jan 08, 2012  4:22 PM       a1c ok at 6.3 and thyroid nl. ldl chol too high. If taking crestor 10mg  every day then recommend increasing to 20mg  qd and check lipid/lft 4-6wks 272.4

## 2012-01-08 NOTE — Telephone Encounter (Signed)
If not increasing medication then pls just have her check lipid with next visit 272.4

## 2012-01-10 NOTE — Telephone Encounter (Signed)
LFT lab canceled; LMOM to inform patient of change in lab orders/SLS

## 2012-01-17 NOTE — Telephone Encounter (Signed)
Labs entered... 01/17/12@3 :48pm/LMB

## 2012-01-31 ENCOUNTER — Encounter: Payer: Self-pay | Admitting: Internal Medicine

## 2012-01-31 ENCOUNTER — Telehealth: Payer: Self-pay | Admitting: Internal Medicine

## 2012-01-31 MED ORDER — DEXLANSOPRAZOLE 60 MG PO CPDR: 60.0000 mg | DELAYED_RELEASE_CAPSULE | Freq: Every day | ORAL | Status: DC

## 2012-01-31 MED ORDER — HYDROCHLOROTHIAZIDE 12.5 MG PO CAPS: 12.5000 mg | ORAL_CAPSULE | ORAL | Status: DC

## 2012-01-31 NOTE — Telephone Encounter (Signed)
Rx done/SLS 

## 2012-01-31 NOTE — Telephone Encounter (Signed)
New prescription request ° °hydrochlorot 12.5mg cap.  °

## 2012-02-02 ENCOUNTER — Other Ambulatory Visit: Payer: Self-pay | Admitting: Internal Medicine

## 2012-02-18 ENCOUNTER — Encounter: Payer: Self-pay | Admitting: Internal Medicine

## 2012-02-19 MED ORDER — DILTIAZEM HCL ER 60 MG PO CP12: 60.0000 mg | ORAL_CAPSULE | Freq: Every day | ORAL | Status: DC

## 2012-02-19 MED ORDER — METOCLOPRAMIDE HCL 10 MG PO TABS: 10.0000 mg | ORAL_TABLET | Freq: Two times a day (BID) | ORAL | Status: DC

## 2012-02-19 MED ORDER — PANTOPRAZOLE SODIUM 40 MG PO TBEC: 40.0000 mg | DELAYED_RELEASE_TABLET | Freq: Every day | ORAL | Status: DC

## 2012-02-19 NOTE — Telephone Encounter (Signed)
Medication changes have been made in EMR/SLS

## 2012-02-21 ENCOUNTER — Other Ambulatory Visit: Payer: Self-pay | Admitting: Internal Medicine

## 2012-02-21 NOTE — Telephone Encounter (Signed)
Done/SLS 

## 2012-03-16 ENCOUNTER — Other Ambulatory Visit: Payer: Self-pay | Admitting: Internal Medicine

## 2012-03-30 ENCOUNTER — Encounter: Payer: Self-pay | Admitting: Internal Medicine

## 2012-04-01 NOTE — Telephone Encounter (Signed)
Spoke w/pt; she has enough HFA to last until her 11.13.13 OV, as she has only been using once daily w/o flare-ups; we will hold Symbicort sample for pt & this matter will be discussed in detail next week/SLS

## 2012-04-03 ENCOUNTER — Ambulatory Visit: Payer: BC Managed Care – PPO

## 2012-04-03 DIAGNOSIS — E119 Type 2 diabetes mellitus without complications: Secondary | ICD-10-CM

## 2012-04-03 DIAGNOSIS — E785 Hyperlipidemia, unspecified: Secondary | ICD-10-CM

## 2012-04-03 DIAGNOSIS — Z862 Personal history of diseases of the blood and blood-forming organs and certain disorders involving the immune mechanism: Secondary | ICD-10-CM

## 2012-04-03 LAB — BASIC METABOLIC PANEL
BUN: 12 mg/dL (ref 6–23)
CO2: 31 mEq/L (ref 19–32)
Calcium: 9.2 mg/dL (ref 8.4–10.5)
Creatinine, Ser: 0.9 mg/dL (ref 0.4–1.2)

## 2012-04-03 LAB — LIPID PANEL
Cholesterol: 179 mg/dL (ref 0–200)
LDL Cholesterol: 114 mg/dL — ABNORMAL HIGH (ref 0–99)
Triglycerides: 120 mg/dL (ref 0.0–149.0)

## 2012-04-03 LAB — HEPATIC FUNCTION PANEL
Albumin: 3.9 g/dL (ref 3.5–5.2)
Bilirubin, Direct: 0.1 mg/dL (ref 0.0–0.3)
Total Protein: 7.2 g/dL (ref 6.0–8.3)

## 2012-04-03 LAB — HEMOGLOBIN A1C: Hgb A1c MFr Bld: 6.5 % (ref 4.6–6.5)

## 2012-04-08 ENCOUNTER — Encounter: Payer: Self-pay | Admitting: Internal Medicine

## 2012-04-08 ENCOUNTER — Ambulatory Visit (INDEPENDENT_AMBULATORY_CARE_PROVIDER_SITE_OTHER): Payer: BC Managed Care – PPO | Admitting: Internal Medicine

## 2012-04-08 VITALS — BP 126/84 | HR 82 | Temp 98.1°F | Resp 14 | Wt 170.5 lb

## 2012-04-08 DIAGNOSIS — E785 Hyperlipidemia, unspecified: Secondary | ICD-10-CM

## 2012-04-08 DIAGNOSIS — E119 Type 2 diabetes mellitus without complications: Secondary | ICD-10-CM

## 2012-04-08 DIAGNOSIS — Z23 Encounter for immunization: Secondary | ICD-10-CM

## 2012-04-08 DIAGNOSIS — J45909 Unspecified asthma, uncomplicated: Secondary | ICD-10-CM

## 2012-04-08 NOTE — Assessment & Plan Note (Signed)
Sample of Symbicort given in substitution. Consider Flovent monotherapy if remains too expensive.

## 2012-04-08 NOTE — Assessment & Plan Note (Signed)
LDL improved but above goal. Cannot tolerate more frequent statin use.

## 2012-04-08 NOTE — Assessment & Plan Note (Signed)
Average control. Continue current regimen. Maintain focus on diabetic diet and weight loss.

## 2012-04-08 NOTE — Patient Instructions (Signed)
Please schedule fasting labs prior to next visit Cbc, chem7, a1c, urine microalbumin-250.00, lipid/lft-272.4 and tsh/free t4-hypothyroidism

## 2012-04-08 NOTE — Progress Notes (Signed)
  Subjective:    Patient ID: [name] [name], female    DOB: 08-May-1956, 56 y.o.   MRN: [id number]  HPI Pt presents to clinic for followup of multiple medical problems. Able to tolerate statin one day a week because of myalgias. LDL improved. A1c mildly increasing. Attempting to lose weight and weight stable from last visit. History of fatty liver with mild elevation of LFTs. Findings Advair expensive. Typically uses only once a day. Asthma well controlled without recent exacerbation.  Past Medical History  Diagnosis Date  . Hypothyroidism   . Diabetes mellitus type II   . Fatty liver     abnormal transaminases; negative work up  . Atypical chest pain     normal coronaries and LV function by cath 10/2011  . GERD (gastroesophageal reflux disease)   . Osteoarthritis     chronic, right knee  . Plantar fasciitis   . Chronic pain disorder     neck and shoulder  . HTN (hypertension)   . HLD (hyperlipidemia)   . Asthma    Past Surgical History  Procedure Date  . Cholecystectomy   . Tonsillectomy 1975  . Knee arthroscopy 1998, 2007  . Vesicovaginal fistula closure w/ tah 1995  . Abdominal hysterectomy 1982    reports that she has never smoked. She has never used smokeless tobacco. She reports that she does not drink alcohol or use illicit drugs. family history includes Alzheimer's disease in her mother; Diabetes type II in her mother; Emphysema in her father; and Hiatal hernia in her mother.  There is no history of Colon cancer. Allergies  Allergen Reactions  . Epinephrine Other (See Comments)    Pass out   . Simvastatin     REACTION: back ache      Review of Systems see hpi     Objective:   Physical Exam  Physical Exam  Nursing note and vitals reviewed. Constitutional: Appears well-developed and well-nourished. No distress.  HENT:  Head: Normocephalic and atraumatic.  Right Ear: External ear normal.  Left Ear: External ear normal.  Eyes: Conjunctivae are normal. No  scleral icterus.  Neck: Neck supple. Carotid bruit is not present.  Cardiovascular: Normal rate, regular rhythm and normal heart sounds.  Exam reveals no gallop and no friction rub.   No murmur heard. Pulmonary/Chest: Effort normal and breath sounds normal. No respiratory distress. He has no wheezes. no rales.  Lymphadenopathy:    He has no cervical adenopathy.  Neurological:Alert.  Skin: Skin is warm and dry. Not diaphoretic.  Psychiatric: Has a normal mood and affect.        Assessment & Plan:

## 2012-04-09 ENCOUNTER — Other Ambulatory Visit: Payer: Self-pay | Admitting: *Deleted

## 2012-04-09 MED ORDER — FLUTICASONE-SALMETEROL 250-50 MCG/DOSE IN AEPB: INHALATION_SPRAY | RESPIRATORY_TRACT | Status: DC

## 2012-04-09 NOTE — Progress Notes (Signed)
Reporting that Symbicort [smaple given] is subtherapeutic & request Rx: Advair to mail order; Done/SLS

## 2012-06-03 ENCOUNTER — Telehealth: Payer: Self-pay | Admitting: Internal Medicine

## 2012-06-03 MED ORDER — SITAGLIPTIN PHOS-METFORMIN HCL 50-500 MG PO TABS: 1.0000 | ORAL_TABLET | Freq: Two times a day (BID) | ORAL | Status: DC

## 2012-06-03 NOTE — Telephone Encounter (Signed)
Rx to pharmacy/SLS 

## 2012-06-03 NOTE — Telephone Encounter (Signed)
Refill- janumet 50-500mg  tab. Qty 90 days supply

## 2012-07-06 ENCOUNTER — Telehealth: Payer: Self-pay

## 2012-07-06 DIAGNOSIS — E119 Type 2 diabetes mellitus without complications: Secondary | ICD-10-CM

## 2012-07-06 DIAGNOSIS — E039 Hypothyroidism, unspecified: Secondary | ICD-10-CM

## 2012-07-06 DIAGNOSIS — E785 Hyperlipidemia, unspecified: Secondary | ICD-10-CM

## 2012-07-06 NOTE — Telephone Encounter (Signed)
Labs ordered for [name] [name]. Pt informed

## 2012-07-07 ENCOUNTER — Ambulatory Visit: Payer: BC Managed Care – PPO

## 2012-07-07 DIAGNOSIS — E039 Hypothyroidism, unspecified: Secondary | ICD-10-CM

## 2012-07-07 DIAGNOSIS — E785 Hyperlipidemia, unspecified: Secondary | ICD-10-CM

## 2012-07-07 DIAGNOSIS — E119 Type 2 diabetes mellitus without complications: Secondary | ICD-10-CM

## 2012-07-07 LAB — CBC
MCHC: 33.8 g/dL (ref 30.0–36.0)
MCV: 85.9 fl (ref 78.0–100.0)
Platelets: 198 10*3/uL (ref 150.0–400.0)
RDW: 13.3 % (ref 11.5–14.6)

## 2012-07-07 LAB — MICROALBUMIN / CREATININE URINE RATIO: Microalb Creat Ratio: 0.7 mg/g (ref 0.0–30.0)

## 2012-07-07 LAB — LIPID PANEL
Cholesterol: 205 mg/dL — ABNORMAL HIGH (ref 0–200)
HDL: 48 mg/dL (ref >39.00)
Total CHOL/HDL Ratio: 4
Triglycerides: 135 mg/dL (ref 0.0–149.0)
VLDL: 27 mg/dL (ref 0.0–40.0)

## 2012-07-07 LAB — BASIC METABOLIC PANEL
BUN: 13 mg/dL (ref 6–23)
CO2: 29 mEq/L (ref 19–32)
Calcium: 8.9 mg/dL (ref 8.4–10.5)
Creatinine, Ser: 0.8 mg/dL (ref 0.4–1.2)
GFR: 78.36 mL/min (ref >60.00)
Glucose, Bld: 142 mg/dL — ABNORMAL HIGH (ref 70–99)

## 2012-07-07 LAB — RENAL FUNCTION PANEL
Albumin: 3.9 g/dL (ref 3.5–5.2)
Chloride: 102 mEq/L (ref 96–112)
GFR: 78.36 mL/min (ref >60.00)
Glucose, Bld: 142 mg/dL — ABNORMAL HIGH (ref 70–99)
Phosphorus: 2.9 mg/dL (ref 2.3–4.6)
Potassium: 3.5 mEq/L (ref 3.5–5.1)
Sodium: 140 mEq/L (ref 135–145)

## 2012-07-07 LAB — TSH: TSH: 0.61 u[IU]/mL (ref 0.35–5.50)

## 2012-07-09 ENCOUNTER — Ambulatory Visit: Payer: BC Managed Care – PPO | Admitting: Family Medicine

## 2012-07-13 ENCOUNTER — Encounter: Payer: Self-pay | Admitting: Family Medicine

## 2012-07-13 ENCOUNTER — Ambulatory Visit (INDEPENDENT_AMBULATORY_CARE_PROVIDER_SITE_OTHER): Payer: BC Managed Care – PPO | Admitting: Family Medicine

## 2012-07-13 VITALS — BP 112/78 | HR 89 | Temp 98.2°F | Ht 66.0 in | Wt 171.0 lb

## 2012-07-13 DIAGNOSIS — E785 Hyperlipidemia, unspecified: Secondary | ICD-10-CM

## 2012-07-13 DIAGNOSIS — M25559 Pain in unspecified hip: Secondary | ICD-10-CM

## 2012-07-13 DIAGNOSIS — M25551 Pain in right hip: Secondary | ICD-10-CM

## 2012-07-13 DIAGNOSIS — E039 Hypothyroidism, unspecified: Secondary | ICD-10-CM

## 2012-07-13 DIAGNOSIS — I10 Essential (primary) hypertension: Secondary | ICD-10-CM

## 2012-07-13 DIAGNOSIS — E119 Type 2 diabetes mellitus without complications: Secondary | ICD-10-CM

## 2012-07-13 MED ORDER — ROSUVASTATIN CALCIUM 5 MG PO TABS: 5.0000 mg | ORAL_TABLET | Freq: Every day | ORAL | Status: DC

## 2012-07-13 NOTE — Patient Instructions (Addendum)
Start Krill oil, MegaRed by [employer] Probiotics such as Digestive Advantage by [employer] Consider grains such as brown rice, millet, quinoa. Amaranth Calcium, Magnesium, Zince tab daily Try CoQ 10 daily if you restart Crestor   Gastroesophageal Reflux Disease, Adult Gastroesophageal reflux disease (GERD) happens when acid from your stomach flows up into the esophagus. When acid comes in contact with the esophagus, the acid causes soreness (inflammation) in the esophagus. Over time, GERD may create small holes (ulcers) in the lining of the esophagus. CAUSES   Increased body weight. This puts pressure on the stomach, making acid rise from the stomach into the esophagus.  Smoking. This increases acid production in the stomach.  Drinking alcohol. This causes decreased pressure in the lower esophageal sphincter (valve or ring of muscle between the esophagus and stomach), allowing acid from the stomach into the esophagus.  Late evening meals and a full stomach. This increases pressure and acid production in the stomach.  A malformed lower esophageal sphincter. Sometimes, no cause is found. SYMPTOMS   Burning pain in the lower part of the mid-chest behind the breastbone and in the mid-stomach area. This may occur twice a week or more often.  Trouble swallowing.  Sore throat.  Dry cough.  Asthma-like symptoms including chest tightness, shortness of breath, or wheezing. DIAGNOSIS  Your caregiver may be able to diagnose GERD based on your symptoms. In some cases, X-rays and other tests may be done to check for complications or to check the condition of your stomach and esophagus. TREATMENT  Your caregiver may recommend over-the-counter or prescription medicines to help decrease acid production. Ask your caregiver before starting or adding any new medicines.  HOME CARE INSTRUCTIONS   Change the factors that you can control. Ask your caregiver for guidance concerning weight loss, quitting  smoking, and alcohol consumption.  Avoid foods and drinks that make your symptoms worse, such as:  Caffeine or alcoholic drinks.  Chocolate.  Peppermint or mint flavorings.  Garlic and onions.  Spicy foods.  Citrus fruits, such as oranges, lemons, or limes.  Tomato-based foods such as sauce, chili, salsa, and pizza.  Fried and fatty foods.  Avoid lying down for the 3 hours prior to your bedtime or prior to taking a nap.  Eat small, frequent meals instead of large meals.  Wear loose-fitting clothing. Do not wear anything tight around your waist that causes pressure on your stomach.  Raise the head of your bed 6 to 8 inches with wood blocks to help you sleep. Extra pillows will not help.  Only take over-the-counter or prescription medicines for pain, discomfort, or fever as directed by your caregiver.  Do not take aspirin, ibuprofen, or other nonsteroidal anti-inflammatory drugs (NSAIDs). SEEK IMMEDIATE MEDICAL CARE IF:   You have pain in your arms, neck, jaw, teeth, or back.  Your pain increases or changes in intensity or duration.  You develop nausea, vomiting, or sweating (diaphoresis).  You develop shortness of breath, or you faint.  Your vomit is green, yellow, black, or looks like coffee grounds or blood.  Your stool is red, bloody, or black. These symptoms could be signs of other problems, such as heart disease, gastric bleeding, or esophageal bleeding. MAKE SURE YOU:   Understand these instructions.  Will watch your condition.  Will get help right away if you are not doing well or get worse. Document Released: 02/20/2005 Document Revised: 08/05/2011 Document Reviewed: 11/30/2010 [facility] Patient Information 2013 [place], [place].

## 2012-07-19 ENCOUNTER — Encounter: Payer: Self-pay | Admitting: Family Medicine

## 2012-07-19 DIAGNOSIS — M25551 Pain in right hip: Secondary | ICD-10-CM | POA: Insufficient documentation

## 2012-07-19 NOTE — Assessment & Plan Note (Signed)
Well treated, no changes 

## 2012-07-19 NOTE — Assessment & Plan Note (Signed)
Encouraged ongoing exercise, krill oil supplements and Aleve prn

## 2012-07-19 NOTE — Assessment & Plan Note (Signed)
Avoid trans fats, continue Crestor, start krill oil

## 2012-07-19 NOTE — Progress Notes (Signed)
Patient ID: [name] [name], female   DOB: [birth year], 57 y.o.   MRN: [id number] [name] [name] [id number] August 05, 1955 07/19/2012      Progress Note-Follow Up  Subjective  Chief Complaint  Chief Complaint  Patient presents with  . Follow-up    3 month    HPI  Patient is a 57 year old female who is in today for followup. Overall she's doing well but she does have a couple concerns. She's noted persistent aching hips since the viral illness back in December. No other joints are hurting no fevers or chills or signs of illness at this time. Is noting some mild thinning and peeling of her nails. No fevers or chills no chest pain, palpitations, shortness of breath GI or GU complaints noted.  Past Medical History  Diagnosis Date  . Hypothyroidism   . Diabetes mellitus type II   . Fatty liver     abnormal transaminases; negative work up  . Atypical chest pain     normal coronaries and LV function by cath 10/2011  . GERD (gastroesophageal reflux disease)   . Osteoarthritis     chronic, right knee  . Plantar fasciitis   . Chronic pain disorder     neck and shoulder  . HTN (hypertension)   . HLD (hyperlipidemia)   . Asthma   . Hip pain, bilateral 07/19/2012    Past Surgical History  Procedure Laterality Date  . Cholecystectomy    . Tonsillectomy  1975  . Knee arthroscopy  1998, 2007  . Vesicovaginal fistula closure w/ tah  1995  . Abdominal hysterectomy  1982    Family History  Problem Relation Age of Onset  . Alzheimer's disease Mother   . Diabetes type II Mother   . Hiatal hernia Mother   . Emphysema Father   . Colon cancer Neg Hx     History   Social History  . Marital Status: Married    Spouse Name: N/A    Number of Children: 1  . Years of Education: N/A   Occupational History  . IT [employer]    Social History Main Topics  . Smoking status: Never Smoker   . Smokeless tobacco: Never Used  . Alcohol Use: No  . Drug Use: No  . Sexually Active: Not on file   Other  Topics Concern  . Not on file   Social History Narrative   Multiple family members with intolerance to statins.    Current Outpatient Prescriptions on File Prior to Visit  Medication Sig Dispense Refill  . albuterol (PROVENTIL,VENTOLIN) 90 MCG/ACT inhaler Inhale 2 puffs into the lungs every 6 (six) hours as needed. For shortness of breath/wheezing      . aspirin 81 MG tablet Take 81 mg by mouth daily.        Marland Kitchen diltiazem (CARDIZEM SR) 60 MG 12 hr capsule Take 1 capsule (60 mg total) by mouth daily.      . Fluticasone-Salmeterol (ADVAIR DISKUS) 250-50 MCG/DOSE AEPB INHALE 1 PUFF INTO LUNGS 2 TIMES DAILY  3 each  3  . glimepiride (AMARYL) 4 MG tablet Take 1 tablet (4 mg total) by mouth daily.  90 tablet  3  . Glucosamine-Chondroitin (OSTEO BI-FLEX REGULAR STRENGTH) 250-200 MG TABS Take 1 tablet by mouth 2 (two) times daily.       . hydrochlorothiazide (MICROZIDE) 12.5 MG capsule Take 1 capsule (12.5 mg total) by mouth every morning.  90 capsule  1  . metoCLOPramide (REGLAN) 10 MG tablet Take 1  tablet (10 mg total) by mouth 2 (two) times daily.      . pantoprazole (PROTONIX) 40 MG tablet Take 1 tablet (40 mg total) by mouth daily.  30 tablet  3  . sitaGLIPtan-metformin (JANUMET) 50-500 MG per tablet Take 1 tablet by mouth 2 (two) times daily with a meal.  180 tablet  1  . SYNTHROID 88 MCG tablet TAKE 1 TABLET DAILY  90 tablet  1   No current facility-administered medications on file prior to visit.    Allergies  Allergen Reactions  . Epinephrine Other (See Comments)    Pass out   . Simvastatin     REACTION: back ache    Review of Systems  Review of Systems  Constitutional: Negative for fever and malaise/fatigue.  HENT: Negative for congestion.   Eyes: Negative for discharge.  Respiratory: Negative for shortness of breath.   Cardiovascular: Negative for chest pain, palpitations and leg swelling.  Gastrointestinal: Negative for nausea, abdominal pain and diarrhea.  Genitourinary:  Negative for dysuria.  Musculoskeletal: Positive for joint pain. Negative for falls.  Skin: Negative for rash.  Neurological: Negative for loss of consciousness and headaches.  Endo/Heme/Allergies: Negative for polydipsia.  Psychiatric/Behavioral: Negative for depression and suicidal ideas. The patient is not nervous/anxious and does not have insomnia.     Objective  BP 112/78  Pulse 89  Temp(Src) 98.2 F (36.8 C) (Oral)  Ht 5\' 6"  (1.676 m)  Wt 171 lb (77.565 kg)  BMI 27.61 kg/m2  SpO2 96%  Physical Exam  Physical Exam  Constitutional: She is oriented to person, place, and time and well-developed, well-nourished, and in no distress. No distress.  HENT:  Head: Normocephalic and atraumatic.  Eyes: Conjunctivae are normal.  Neck: Neck supple. No thyromegaly present.  Cardiovascular: Normal rate, regular rhythm and normal heart sounds.   No murmur heard. Pulmonary/Chest: Effort normal and breath sounds normal. She has no wheezes.  Abdominal: She exhibits no distension and no mass.  Musculoskeletal: She exhibits no edema.  Lymphadenopathy:    She has no cervical adenopathy.  Neurological: She is alert and oriented to person, place, and time.  Skin: Skin is warm and dry. No rash noted. She is not diaphoretic.  Psychiatric: Memory, affect and judgment normal.    Lab Results  Component Value Date   TSH 0.61 07/07/2012   Lab Results  Component Value Date   WBC 5.6 07/07/2012   HGB 13.1 07/07/2012   HCT 38.7 07/07/2012   MCV 85.9 07/07/2012   PLT 198.0 07/07/2012   Lab Results  Component Value Date   CREATININE 0.8 07/07/2012   CREATININE 0.8 07/07/2012   BUN 13 07/07/2012   BUN 13 07/07/2012   NA 140 07/07/2012   NA 140 07/07/2012   K 3.5 07/07/2012   K 3.5 07/07/2012   CL 102 07/07/2012   CL 102 07/07/2012   CO2 29 07/07/2012   CO2 29 07/07/2012   Lab Results  Component Value Date   ALT 68* 04/03/2012   AST 54* 04/03/2012   ALKPHOS 59 04/03/2012   BILITOT 0.7 04/03/2012    Lab Results  Component Value Date   CHOL 205* 07/07/2012   Lab Results  Component Value Date   HDL 48.00 07/07/2012   Lab Results  Component Value Date   LDLCALC 114* 04/03/2012   Lab Results  Component Value Date   TRIG 135.0 07/07/2012   Lab Results  Component Value Date   CHOLHDL 4 07/07/2012  Assessment & Plan  Hypertension Well controlled, no changes  DIABETES MELLITUS, TYPE II Well controlled no changes. Minimize simple carbs.  DYSLIPIDEMIA Avoid trans fats, continue Crestor, start krill oil  HYPOTHYROIDISM Well treated, no changes.   Hip pain, bilateral Encouraged ongoing exercise, krill oil supplements and Aleve prn

## 2012-07-19 NOTE — Assessment & Plan Note (Signed)
Well controlled no changes. Minimize simple carbs.

## 2012-07-19 NOTE — Assessment & Plan Note (Signed)
Well controlled, no changes 

## 2012-07-27 ENCOUNTER — Telehealth: Payer: Self-pay | Admitting: Internal Medicine

## 2012-07-27 MED ORDER — HYDROCHLOROTHIAZIDE 12.5 MG PO CAPS: 12.5000 mg | ORAL_CAPSULE | ORAL | Status: DC

## 2012-07-27 NOTE — Telephone Encounter (Signed)
New prescription request  hydrochlorot 12.5mg  cap.

## 2012-08-04 ENCOUNTER — Encounter: Payer: Self-pay | Admitting: Internal Medicine

## 2012-08-04 ENCOUNTER — Ambulatory Visit (INDEPENDENT_AMBULATORY_CARE_PROVIDER_SITE_OTHER): Payer: BC Managed Care – PPO | Admitting: Internal Medicine

## 2012-08-04 VITALS — BP 132/78 | HR 107 | Temp 97.6°F | Ht 66.0 in | Wt 169.0 lb

## 2012-08-04 DIAGNOSIS — J309 Allergic rhinitis, unspecified: Secondary | ICD-10-CM

## 2012-08-04 NOTE — Patient Instructions (Signed)
Allergic Rhinitis  Allergic rhinitis is when the mucous membranes in the nose respond to allergens. Allergens are particles in the air that cause your body to have an allergic reaction. This causes you to release allergic antibodies. Through a chain of events, these eventually cause you to release histamine into the blood stream (hence the use of antihistamines). Although meant to be protective to the body, it is this release that causes your discomfort, such as frequent sneezing, congestion and an itchy runny nose.    CAUSES    The pollen allergens may come from grasses, trees, and weeds. This is seasonal allergic rhinitis, or "hay fever." Other allergens cause year-round allergic rhinitis (perennial allergic rhinitis) such as house dust mite allergen, pet dander and mold spores.    SYMPTOMS     Nasal stuffiness (congestion).   Runny, itchy nose with sneezing and tearing of the eyes.   There is often an itching of the mouth, eyes and ears.  It cannot be cured, but it can be controlled with medications.  DIAGNOSIS    If you are unable to determine the offending allergen, skin or blood testing may find it.  TREATMENT     Avoid the allergen.   Medications and allergy shots (immunotherapy) can help.   Hay fever may often be treated with antihistamines in pill or nasal spray forms. Antihistamines block the effects of histamine. There are over-the-counter medicines that may help with nasal congestion and swelling around the eyes. Check with your caregiver before taking or giving this medicine.  If the treatment above does not work, there are many new medications your caregiver can prescribe. Stronger medications may be used if initial measures are ineffective. Desensitizing injections can be used if medications and avoidance fails. Desensitization is when a patient is given ongoing shots until the body becomes less sensitive to the allergen. Make sure you follow up with your caregiver if problems continue.   SEEK MEDICAL CARE IF:     You develop fever (more than 100.5 F (38.1 C).   You develop a cough that does not stop easily (persistent).   You have shortness of breath.   You start wheezing.   Symptoms interfere with normal daily activities.  Document Released: 02/05/2001 Document Revised: 08/05/2011 Document Reviewed: 08/17/2008  [employer] Patient Information 2013 [employer].

## 2012-08-04 NOTE — Progress Notes (Signed)
HPI  Pt presents to the clinic today with c/o cold symptoms x 2 days. She does have a cough but is not coughing anything up. She denies fevers, chills or body aches. She fills like the congestion is running down the back of her throat and causing her to cough. She has been taking Claritin but does not seem to be helping this go around. She has had sick contacts.  Review of Systems      Past Medical History  Diagnosis Date  . Hypothyroidism   . Diabetes mellitus type II   . Fatty liver     abnormal transaminases; negative work up  . Atypical chest pain     normal coronaries and LV function by cath 10/2011  . GERD (gastroesophageal reflux disease)   . Osteoarthritis     chronic, right knee  . Plantar fasciitis   . Chronic pain disorder     neck and shoulder  . HTN (hypertension)   . HLD (hyperlipidemia)   . Asthma   . Hip pain, bilateral 07/19/2012    Family History  Problem Relation Age of Onset  . Alzheimer's disease Mother   . Diabetes type II Mother   . Hiatal hernia Mother   . Emphysema Father   . Colon cancer Neg Hx     History   Social History  . Marital Status: Married    Spouse Name: N/A    Number of Children: 1  . 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 Smoking status: Never Smoker   . Smokeless tobacco: Never Used  . Alcohol Use: No  . Drug Use: No  . Sexually Active: Not on file   Other Topics Concern  . Not on file   Social History Narrative   Multiple family members with intolerance to statins.    Allergies  Allergen Reactions  . Epinephrine Other (See Comments)    Pass out   . Simvastatin     REACTION: back ache     Constitutional: Denies  Headache, fatigue, fever or abrupt weight changes.  HEENT:  Positive nasal congestion. Denies eye redness, eye pain, pressure behind the eyes, facial pain, ear pain, ringing in the ears, wax buildup, runny nose or bloody nose. Respiratory: Positive  cough. Denies difficulty breathing or shortness of breath.  Cardiovascular: Denies chest pain, chest tightness, palpitations or swelling in the hands or feet.   No other specific complaints in a complete review of systems (except as listed in HPI above).  Objective:   BP 132/78  Pulse 107  Temp(Src) 97.6 F (36.4 C) (Oral)  Ht 5\' 6"  (1.676 m)  Wt 169 lb (76.658 kg)  BMI 27.29 kg/m2  SpO2 97% Wt Readings from Last 3 Encounters:  08/04/12 169 lb (76.658 kg)  07/13/12 171 lb (77.565 kg)  04/08/12 170 lb 8 oz (77.338 kg)     General: Appears her stated age, well developed, well nourished in NAD. HEENT: Head: normal shape and size; Eyes: sclera white, no icterus, conjunctiva pink, PERRLA and EOMs intact; Ears: Tm's gray and intact, normal light reflex; Nose: mucosa pink and moist, septum midline; Throat/Mouth: + PND. Teeth present, mucosa erythematous and moist, no exudate noted, no lesions or ulcerations noted.  Neck:  Neck supple, trachea midline. No massses, lumps or thyromegaly present.  Cardiovascular: Normal rate and rhythm. S1,S2 noted.  No murmur, rubs or gallops noted. No JVD or BLE edema. No  carotid bruits noted. Pulmonary/Chest: Normal effort and positive vesicular breath sounds. No respiratory distress. No wheezes, rales or ronchi noted.      Assessment & Plan:   Allergic Rhinitis, new onset:  Get some rest and drink plenty of water Continue taking your claritin daily Get some Afrin nasal spray for the nasal congestion If not better by Friday, give me a call and I will call you in a z pack  RTC as needed or if symptoms persist.

## 2012-08-17 ENCOUNTER — Encounter: Payer: Self-pay | Admitting: Family Medicine

## 2012-08-18 MED ORDER — LEVOTHYROXINE SODIUM 88 MCG PO TABS: 88.0000 ug | ORAL_TABLET | Freq: Every day | ORAL | Status: DC

## 2012-10-22 ENCOUNTER — Telehealth: Payer: Self-pay | Admitting: Internal Medicine

## 2012-10-22 DIAGNOSIS — E119 Type 2 diabetes mellitus without complications: Secondary | ICD-10-CM

## 2012-10-22 DIAGNOSIS — I10 Essential (primary) hypertension: Secondary | ICD-10-CM

## 2012-10-22 DIAGNOSIS — E039 Hypothyroidism, unspecified: Secondary | ICD-10-CM

## 2012-10-22 DIAGNOSIS — E785 Hyperlipidemia, unspecified: Secondary | ICD-10-CM

## 2012-10-22 NOTE — Telephone Encounter (Signed)
Patient states that she went to the [facility] lab this morning for blood work and the orders had not been put in. She did not do labs but will go back tomorrow morning. Please send lab orders to [facility] for tomorrow morning. Patient would like a call back when lab orders are entered.

## 2012-10-22 NOTE — Telephone Encounter (Signed)
Please advise what labs? I don't see where you wanted any done?

## 2012-10-22 NOTE — Telephone Encounter (Signed)
OK to order hgba1c, lipid, renal, hepatic at [facility] for tomorrow am

## 2012-10-22 NOTE — Telephone Encounter (Signed)
Lab order placed and detailed message left on vm

## 2012-10-23 ENCOUNTER — Other Ambulatory Visit (INDEPENDENT_AMBULATORY_CARE_PROVIDER_SITE_OTHER): Payer: BC Managed Care – PPO

## 2012-10-23 DIAGNOSIS — E785 Hyperlipidemia, unspecified: Secondary | ICD-10-CM

## 2012-10-23 DIAGNOSIS — I10 Essential (primary) hypertension: Secondary | ICD-10-CM

## 2012-10-23 DIAGNOSIS — E039 Hypothyroidism, unspecified: Secondary | ICD-10-CM

## 2012-10-23 DIAGNOSIS — E119 Type 2 diabetes mellitus without complications: Secondary | ICD-10-CM

## 2012-10-23 LAB — LIPID PANEL
Cholesterol: 222 mg/dL — ABNORMAL HIGH (ref 0–200)
HDL: 49.5 mg/dL (ref >39.00)
Total CHOL/HDL Ratio: 4
Triglycerides: 112 mg/dL (ref 0.0–149.0)
VLDL: 22.4 mg/dL (ref 0.0–40.0)

## 2012-10-23 LAB — RENAL FUNCTION PANEL
Albumin: 3.7 g/dL (ref 3.5–5.2)
BUN: 10 mg/dL (ref 6–23)
Chloride: 102 mEq/L (ref 96–112)
Creatinine, Ser: 0.9 mg/dL (ref 0.4–1.2)
GFR: 71.06 mL/min (ref >60.00)
Glucose, Bld: 147 mg/dL — ABNORMAL HIGH (ref 70–99)
Phosphorus: 3.2 mg/dL (ref 2.3–4.6)

## 2012-10-23 LAB — HEPATIC FUNCTION PANEL
Albumin: 3.7 g/dL (ref 3.5–5.2)
Alkaline Phosphatase: 56 U/L (ref 39–117)
Total Protein: 6.9 g/dL (ref 6.0–8.3)

## 2012-10-23 LAB — HEMOGLOBIN A1C: Hgb A1c MFr Bld: 6.5 % (ref 4.6–6.5)

## 2012-10-23 LAB — LDL CHOLESTEROL, DIRECT: Direct LDL: 158.3 mg/dL

## 2012-10-27 ENCOUNTER — Encounter: Payer: Self-pay | Admitting: Family Medicine

## 2012-10-27 ENCOUNTER — Other Ambulatory Visit (HOSPITAL_COMMUNITY)
Admission: RE | Admit: 2012-10-27 | Discharge: 2012-10-27 | Disposition: A | Discharge status: Home / Self Care | Payer: BC Managed Care – PPO | Source: Ambulatory Visit | Attending: Family Medicine | Admitting: Family Medicine

## 2012-10-27 ENCOUNTER — Ambulatory Visit (INDEPENDENT_AMBULATORY_CARE_PROVIDER_SITE_OTHER): Payer: BC Managed Care – PPO | Admitting: Family Medicine

## 2012-10-27 ENCOUNTER — Telehealth: Payer: Self-pay | Admitting: Family Medicine

## 2012-10-27 VITALS — BP 138/82 | HR 84 | Temp 97.9°F | Ht 66.0 in | Wt 170.1 lb

## 2012-10-27 DIAGNOSIS — E039 Hypothyroidism, unspecified: Secondary | ICD-10-CM

## 2012-10-27 DIAGNOSIS — K7689 Other specified diseases of liver: Secondary | ICD-10-CM

## 2012-10-27 DIAGNOSIS — I10 Essential (primary) hypertension: Secondary | ICD-10-CM

## 2012-10-27 DIAGNOSIS — E785 Hyperlipidemia, unspecified: Secondary | ICD-10-CM

## 2012-10-27 DIAGNOSIS — E119 Type 2 diabetes mellitus without complications: Secondary | ICD-10-CM

## 2012-10-27 DIAGNOSIS — Z01419 Encounter for gynecological examination (general) (routine) without abnormal findings: Secondary | ICD-10-CM | POA: Insufficient documentation

## 2012-10-27 DIAGNOSIS — Z124 Encounter for screening for malignant neoplasm of cervix: Secondary | ICD-10-CM

## 2012-10-27 MED ORDER — PITAVASTATIN CALCIUM 2 MG PO TABS: ORAL_TABLET | ORAL | Status: DC

## 2012-10-27 NOTE — Telephone Encounter (Signed)
Order for labs at [facility] the last week of august Labs prior to visit lipid, renal, cbc, hepatic, hgba1c, tsh

## 2012-10-27 NOTE — Patient Instructions (Addendum)
Labs prior to visit lipid, renal, cbc, hepatic, hgba1c, tsh  Fatty Liver Fatty liver is the accumulation of fat in liver cells. It is also called hepatosteatosis or steatohepatitis. It is normal for your liver to contain some fat. If fat is more than 5 to 10% of your liver's weight, you have fatty liver.  There are often no symptoms (problems) for years while damage is still occurring. People often learn about their fatty liver when they have medical tests for other reasons. Fat can damage your liver for years or even decades without causing problems. When it becomes severe, it can cause fatigue, weight loss, weakness, and confusion. This makes you more likely to develop more serious liver problems. The liver is the largest organ in the body. It does a lot of work and often gives no warning signs when it is sick until late in a disease. The liver has many important jobs including:  Breaking down foods.  Storing vitamins, iron, and other minerals.  Making proteins.  Making bile for food digestion.  Breaking down many products including medications, alcohol and some poisons. CAUSES  There are a number of different conditions, medications, and poisons that can cause a fatty liver. Eating too many calories causes fat to build up in the liver. Not processing and breaking fats down normally may also cause this. Certain conditions, such as obesity, diabetes, and high triglycerides also cause this. Most fatty liver patients tend to be middle-aged and over weight.  Some causes of fatty liver are:  Alcohol over consumption.  Malnutrition.  Steroid use.  Valproic acid toxicity.  Obesity.  Cushing's syndrome.  Poisons.  Tetracycline in high dosages.  Pregnancy.  Diabetes.  Hyperlipidemia.  Rapid weight loss. Some people develop fatty liver even having none of these conditions. SYMPTOMS  Fatty liver most often causes no problems. This is called asymptomatic.  It can be diagnosed  with blood tests and also by a liver biopsy.  It is one of the most common causes of minor elevations of liver enzymes on routine blood tests.  Specialized Imaging of the liver using ultrasound, CT (computed tomography) scan, or MRI (magnetic resonance imaging) can suggest a fatty liver but a biopsy is needed to confirm it.  A biopsy involves taking a small sample of liver tissue. This is done by using a needle. It is then looked at under a microscope by a specialist. TREATMENT  It is important to treat the cause. Simple fatty liver without a medical reason may not need treatment.  Weight loss, fat restriction, and exercise in overweight patients produces inconsistent results but is worth trying.  Fatty liver due to alcohol toxicity may not improve even with stopping drinking.  Good control of diabetes may reduce fatty liver.  Lower your triglycerides through diet, medication or both.  Eat a balanced, healthy diet.  Increase your physical activity.  Get regular checkups from a liver specialist.  There are no medical or surgical treatments for a fatty liver or NASH, but improving your diet and increasing your exercise may help prevent or reverse some of the damage. PROGNOSIS  Fatty liver may cause no damage or it can lead to an inflammation of the liver. This is, called steatohepatitis. When it is linked to alcohol abuse, it is called alcoholic steatohepatitis. It often is not linked to alcohol. It is then called nonalcoholic steatohepatitis, or NASH. Over time the liver may become scarred and hardened. This condition is called cirrhosis. Cirrhosis is serious and may  lead to liver failure or cancer. NASH is one of the leading causes of cirrhosis. About 10-20% of Americans have fatty liver and a smaller 2-5% has NASH. Document Released: 06/28/2005 Document Revised: 08/05/2011 Document Reviewed: 08/21/2005 [facility] Patient Information 2014 [place], [place].

## 2012-10-27 NOTE — Telephone Encounter (Signed)
Orders placed for this encounter.

## 2012-10-27 NOTE — Progress Notes (Signed)
Patient ID: [name] [name], female   DOB: [birth year], 57 y.o.   MRN: [id number] [name] [name] [id number] [birth year] 10/27/2012      Progress Note-Follow Up  Subjective  Chief Complaint  Chief Complaint  Patient presents with  . Annual Exam    physical  . Gynecologic Exam    pap    HPI  Patient is a 57 year old female who is in today for followup. Had only been taking Crestor once to twice a week due to myalgias. When she took less frequent doses these decreased. No other complaints. No recent illness, fevers, chills, chest pain, palpitations, shortness or breath, GI or GU complaints. Does take all other medications as prescribed.  Past Medical History  Diagnosis Date  . Hypothyroidism   . Diabetes mellitus type II   . Fatty liver     abnormal transaminases; negative work up  . Atypical chest pain     normal coronaries and LV function by cath 10/2011  . GERD (gastroesophageal reflux disease)   . Osteoarthritis     chronic, right knee  . Plantar fasciitis   . Chronic pain disorder     neck and shoulder  . HTN (hypertension)   . HLD (hyperlipidemia)   . Asthma   . Hip pain, bilateral 07/19/2012    Past Surgical History  Procedure Laterality Date  . Cholecystectomy    . Tonsillectomy  1975  . Knee arthroscopy  1998, 2007  . Vesicovaginal fistula closure w/ tah  1995  . Abdominal hysterectomy  1982    Family History  Problem Relation Age of Onset  . Alzheimer's disease Mother   . Diabetes type II Mother   . Hiatal hernia Mother   . Diabetes Mother   . Emphysema Father   . Cancer Father     leukemia  . Colon cancer Neg Hx   . Depression Sister     suicide  . Diabetes Sister     History   Social History  . Marital Status: Married    Spouse Name: N/A    Number of Children: 1  . 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 Smoking status: Never Smoker   . Smokeless tobacco: Never Used  . Alcohol Use: No  .  Drug Use: No  . Sexually Active: No   Other Topics Concern  . Not on file   Social History Narrative   Multiple family members with intolerance to statins.    Current Outpatient Prescriptions on File Prior to Visit  Medication Sig Dispense Refill  . albuterol (PROVENTIL,VENTOLIN) 90 MCG/ACT inhaler Inhale 2 puffs into the lungs every 6 (six) hours as needed. For shortness of breath/wheezing      . Alcohol Swabs (ALCOHOL PADS) 70 % PADS       . aspirin 81 MG tablet Take 81 mg by mouth daily.        Marland Kitchen diltiazem (CARDIZEM SR) 60 MG 12 hr capsule Take 1 capsule (60 mg total) by mouth daily.      . Fluticasone-Salmeterol (ADVAIR DISKUS) 250-50 MCG/DOSE AEPB INHALE 1 PUFF INTO LUNGS 2 TIMES DAILY  3 each  3  . FORA V12 BLOOD GLUCOSE TEST test strip       . glimepiride (AMARYL) 4 MG tablet Take 1 tablet (4 mg total) by mouth daily.  90 tablet  3  . Glucosamine-Chondroitin (OSTEO BI-FLEX REGULAR STRENGTH) 250-200 MG TABS Take 1  tablet by mouth 2 (two) times daily.       . hydrochlorothiazide (MICROZIDE) 12.5 MG capsule Take 1 capsule (12.5 mg total) by mouth every morning.  90 capsule  1  . levothyroxine (SYNTHROID) 88 MCG tablet Take 1 tablet (88 mcg total) by mouth daily.  90 tablet  1  . Lite Touch Lancets MISC       . metoCLOPramide (REGLAN) 10 MG tablet Take 1 tablet (10 mg total) by mouth 2 (two) times daily.      . Multiple Vitamin (MULTIVITAMIN) tablet Take 1 tablet by mouth daily.      . pantoprazole (PROTONIX) 40 MG tablet Take 1 tablet (40 mg total) by mouth daily.  30 tablet  3  . sitaGLIPtan-metformin (JANUMET) 50-500 MG per tablet Take 1 tablet by mouth 2 (two) times daily with a meal.  180 tablet  1   No current facility-administered medications on file prior to visit.    Allergies  Allergen Reactions  . Epinephrine Other (See Comments)    Pass out   . Simvastatin     REACTION: back ache    Review of Systems  Review of Systems  Constitutional: Positive for  malaise/fatigue. Negative for fever.  HENT: Negative for congestion.   Eyes: Negative for discharge.  Respiratory: Negative for shortness of breath.   Cardiovascular: Negative for chest pain, palpitations and leg swelling.  Gastrointestinal: Negative for nausea, abdominal pain and diarrhea.  Genitourinary: Negative for dysuria.  Musculoskeletal: Positive for myalgias. Negative for falls.       Improved with decrease in Crestor  Skin: Negative for rash.  Neurological: Negative for loss of consciousness and headaches.  Endo/Heme/Allergies: Negative for polydipsia.  Psychiatric/Behavioral: Negative for depression and suicidal ideas. The patient is not nervous/anxious and does not have insomnia.     Objective  BP 138/82  Pulse 84  Temp(Src) 97.9 F (36.6 C) (Oral)  Ht 5\' 6"  (1.676 m)  Wt 170 lb 1.9 oz (77.166 kg)  BMI 27.47 kg/m2  SpO2 95%  Physical Exam  Physical Exam  Constitutional: She is oriented to person, place, and time and well-developed, well-nourished, and in no distress. No distress.  HENT:  Head: Normocephalic and atraumatic.  Eyes: Conjunctivae are normal.  Neck: Neck supple. No thyromegaly present.  Cardiovascular: Normal rate, regular rhythm and normal heart sounds.   No murmur heard. Pulmonary/Chest: Effort normal and breath sounds normal. She has no wheezes.  Abdominal: She exhibits no distension and no mass.  Musculoskeletal: She exhibits no edema.  Lymphadenopathy:    She has no cervical adenopathy.  Neurological: She is alert and oriented to person, place, and time.  Skin: Skin is warm and dry. No rash noted. She is not diaphoretic.  Psychiatric: Memory, affect and judgment normal.    Lab Results  Component Value Date   TSH 0.61 07/07/2012   Lab Results  Component Value Date   WBC 5.6 07/07/2012   HGB 13.1 07/07/2012   HCT 38.7 07/07/2012   MCV 85.9 07/07/2012   PLT 198.0 07/07/2012   Lab Results  Component Value Date   CREATININE 0.9 10/23/2012    BUN 10 10/23/2012   NA 141 10/23/2012   K 3.5 10/23/2012   CL 102 10/23/2012   CO2 30 10/23/2012   Lab Results  Component Value Date   ALT 63* 10/23/2012   AST 56* 10/23/2012   ALKPHOS 56 10/23/2012   BILITOT 0.7 10/23/2012   Lab Results  Component Value Date  CHOL 222* 10/23/2012   Lab Results  Component Value Date   HDL 49.50 10/23/2012   Lab Results  Component Value Date   LDLCALC 114* 04/03/2012   Lab Results  Component Value Date   TRIG 112.0 10/23/2012   Lab Results  Component Value Date   CHOLHDL 4 10/23/2012     Assessment & Plan  Hypertension Well controlled, no changes  DYSLIPIDEMIA try Livalo 2 mg qod, avoid trans fats, add a krill oil cap  FATTY LIVER DISEASE Mild elevation in LFTs encouraged decreased carbs and increased exercise  DIABETES MELLITUS, TYPE II hgba1c 6.5 minimize simple carbs and increase exercise, continue current meds. Try DASH diet

## 2012-10-29 NOTE — Progress Notes (Signed)
Quick Note:  Patient Informed and voiced understanding ______ 

## 2012-10-29 NOTE — Assessment & Plan Note (Signed)
Mild elevation in LFTs encouraged decreased carbs and increased exercise

## 2012-10-29 NOTE — Assessment & Plan Note (Signed)
try Livalo 2 mg qod, avoid trans fats, add a krill oil cap

## 2012-10-29 NOTE — Assessment & Plan Note (Signed)
hgba1c 6.5 minimize simple carbs and increase exercise, continue current meds. Try DASH diet

## 2012-10-29 NOTE — Assessment & Plan Note (Signed)
Well controlled, no changes 

## 2012-12-03 ENCOUNTER — Other Ambulatory Visit: Payer: Self-pay

## 2012-12-17 ENCOUNTER — Other Ambulatory Visit: Payer: Self-pay | Admitting: *Deleted

## 2012-12-17 MED ORDER — SITAGLIPTIN PHOS-METFORMIN HCL 50-500 MG PO TABS: 1.0000 | ORAL_TABLET | Freq: Two times a day (BID) | ORAL | Status: DC

## 2012-12-17 NOTE — Telephone Encounter (Signed)
Rx request to pharmacy/SLS  

## 2012-12-25 ENCOUNTER — Other Ambulatory Visit: Payer: Self-pay | Admitting: *Deleted

## 2012-12-25 DIAGNOSIS — E119 Type 2 diabetes mellitus without complications: Secondary | ICD-10-CM

## 2012-12-25 MED ORDER — GLIMEPIRIDE 4 MG PO TABS: 4.0000 mg | ORAL_TABLET | Freq: Every day | ORAL | Status: DC

## 2013-01-15 ENCOUNTER — Encounter: Payer: Self-pay | Admitting: Family Medicine

## 2013-01-15 DIAGNOSIS — E039 Hypothyroidism, unspecified: Secondary | ICD-10-CM

## 2013-01-15 DIAGNOSIS — E785 Hyperlipidemia, unspecified: Secondary | ICD-10-CM

## 2013-01-15 DIAGNOSIS — E119 Type 2 diabetes mellitus without complications: Secondary | ICD-10-CM

## 2013-01-15 DIAGNOSIS — K7689 Other specified diseases of liver: Secondary | ICD-10-CM

## 2013-01-15 DIAGNOSIS — I10 Essential (primary) hypertension: Secondary | ICD-10-CM

## 2013-01-20 ENCOUNTER — Ambulatory Visit (INDEPENDENT_AMBULATORY_CARE_PROVIDER_SITE_OTHER): Payer: BC Managed Care – PPO

## 2013-01-20 DIAGNOSIS — I10 Essential (primary) hypertension: Secondary | ICD-10-CM

## 2013-01-20 DIAGNOSIS — E785 Hyperlipidemia, unspecified: Secondary | ICD-10-CM

## 2013-01-20 DIAGNOSIS — E039 Hypothyroidism, unspecified: Secondary | ICD-10-CM

## 2013-01-20 DIAGNOSIS — E119 Type 2 diabetes mellitus without complications: Secondary | ICD-10-CM

## 2013-01-20 LAB — CBC
HCT: 39.3 % (ref 36.0–46.0)
Hemoglobin: 13.4 g/dL (ref 12.0–15.0)
Platelets: 201 10*3/uL (ref 150.0–400.0)
RBC: 4.61 Mil/uL (ref 3.87–5.11)
RDW: 13.2 % (ref 11.5–14.6)
WBC: 6.4 10*3/uL (ref 4.5–10.5)

## 2013-01-21 LAB — HEPATIC FUNCTION PANEL
ALT: 83 U/L — ABNORMAL HIGH (ref 0–35)
Alkaline Phosphatase: 55 U/L (ref 39–117)
Bilirubin, Direct: 0.1 mg/dL (ref 0.0–0.3)
Total Bilirubin: 0.6 mg/dL (ref 0.3–1.2)
Total Protein: 7.3 g/dL (ref 6.0–8.3)

## 2013-01-21 LAB — LIPID PANEL
Cholesterol: 230 mg/dL — ABNORMAL HIGH (ref 0–200)
Total CHOL/HDL Ratio: 4
Triglycerides: 145 mg/dL (ref 0.0–149.0)

## 2013-01-21 LAB — RENAL FUNCTION PANEL
CO2: 29 mEq/L (ref 19–32)
Calcium: 9.3 mg/dL (ref 8.4–10.5)
Chloride: 101 mEq/L (ref 96–112)
Creatinine, Ser: 0.8 mg/dL (ref 0.4–1.2)
GFR: 74.96 mL/min (ref >60.00)
Potassium: 4 mEq/L (ref 3.5–5.1)
Sodium: 139 mEq/L (ref 135–145)

## 2013-01-22 ENCOUNTER — Other Ambulatory Visit: Payer: Self-pay | Admitting: *Deleted

## 2013-01-22 MED ORDER — HYDROCHLOROTHIAZIDE 12.5 MG PO CAPS: 12.5000 mg | ORAL_CAPSULE | ORAL | Status: DC

## 2013-01-22 MED ORDER — LEVOTHYROXINE SODIUM 88 MCG PO TABS: 88.0000 ug | ORAL_TABLET | Freq: Every day | ORAL | Status: DC

## 2013-01-22 NOTE — Telephone Encounter (Signed)
Rx request to pharmacy/SLS  

## 2013-01-27 ENCOUNTER — Encounter: Payer: Self-pay | Admitting: Family Medicine

## 2013-01-27 ENCOUNTER — Ambulatory Visit (INDEPENDENT_AMBULATORY_CARE_PROVIDER_SITE_OTHER): Payer: BC Managed Care – PPO | Admitting: Family Medicine

## 2013-01-27 VITALS — BP 100/78 | HR 86 | Temp 98.3°F | Ht 66.0 in | Wt 169.0 lb

## 2013-01-27 DIAGNOSIS — G5601 Carpal tunnel syndrome, right upper limb: Secondary | ICD-10-CM

## 2013-01-27 DIAGNOSIS — E785 Hyperlipidemia, unspecified: Secondary | ICD-10-CM

## 2013-01-27 DIAGNOSIS — R5381 Other malaise: Secondary | ICD-10-CM

## 2013-01-27 DIAGNOSIS — E119 Type 2 diabetes mellitus without complications: Secondary | ICD-10-CM

## 2013-01-27 DIAGNOSIS — Z23 Encounter for immunization: Secondary | ICD-10-CM

## 2013-01-27 DIAGNOSIS — E039 Hypothyroidism, unspecified: Secondary | ICD-10-CM

## 2013-01-27 DIAGNOSIS — I10 Essential (primary) hypertension: Secondary | ICD-10-CM

## 2013-01-27 DIAGNOSIS — G5600 Carpal tunnel syndrome, unspecified upper limb: Secondary | ICD-10-CM | POA: Insufficient documentation

## 2013-01-27 HISTORY — DX: Carpal tunnel syndrome, unspecified upper limb: G56.00

## 2013-01-27 NOTE — Progress Notes (Signed)
Patient ID: [name] [name], female   DOB: [birth year], 57 y.o.   MRN: [id number] MAKINZEE DURLEY [id number] 30-Dec-1955 01/27/2013      Progress Note-Follow Up  Subjective  Chief Complaint  Chief Complaint  Patient presents with  . Follow-up    3 month    HPI  Patient is a 57 year old Caucasian female in today for followup. She had years worth of right wrist pain recently it is worsened. Stenting pain up to her shoulder. Pain is worse with use. Previous workup many years ago did reveal carpal tunnel syndrome. No recent illness. No chest pain, palpitations, shortness of, GI or GU complaints. He is taking meds as prescribed.  Past Medical History  Diagnosis Date  . Hypothyroidism   . Diabetes mellitus type II   . Fatty liver     abnormal transaminases; negative work up  . Atypical chest pain     normal coronaries and LV function by cath 10/2011  . GERD (gastroesophageal reflux disease)   . Osteoarthritis     chronic, right knee  . Plantar fasciitis   . Chronic pain disorder     neck and shoulder  . HTN (hypertension)   . HLD (hyperlipidemia)   . Asthma   . Hip pain, bilateral 07/19/2012  . Other malaise and fatigue 01/27/2013  . CTS (carpal tunnel syndrome) 01/27/2013    right    Past Surgical History  Procedure Laterality Date  . Cholecystectomy    . Tonsillectomy  1975  . Knee arthroscopy  1998, 2007  . Vesicovaginal fistula closure w/ tah  1995  . Abdominal hysterectomy  1982    Family History  Problem Relation Age of Onset  . Alzheimer's disease Mother   . Diabetes type II Mother   . Hiatal hernia Mother   . Diabetes Mother   . Emphysema Father   . Cancer Father     leukemia  . Colon cancer Neg Hx   . Depression Sister     suicide  . Diabetes Sister     History   Social History  . Marital Status: Married    Spouse Name: N/A    Number of Children: 1  . 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 Smoking  status: Never Smoker   . Smokeless tobacco: Never Used  . Alcohol Use: No  . Drug Use: No  . Sexual Activity: No   Other Topics Concern  . Not on file   Social History Narrative   Multiple family members with intolerance to statins.    Current Outpatient Prescriptions on File Prior to Visit  Medication Sig Dispense Refill  . albuterol (PROVENTIL,VENTOLIN) 90 MCG/ACT inhaler Inhale 2 puffs into the lungs every 6 (six) hours as needed. For shortness of breath/wheezing      . Alcohol Swabs (ALCOHOL PADS) 70 % PADS       . aspirin 81 MG tablet Take 81 mg by mouth daily.        Marland Kitchen diltiazem (CARDIZEM SR) 60 MG 12 hr capsule Take 1 capsule (60 mg total) by mouth daily.      . Fluticasone-Salmeterol (ADVAIR DISKUS) 250-50 MCG/DOSE AEPB INHALE 1 PUFF INTO LUNGS 2 TIMES DAILY  3 each  3  . FORA V12 BLOOD GLUCOSE TEST test strip       . glimepiride (AMARYL) 4 MG tablet Take 1 tablet (4 mg total) by mouth daily.  90  tablet  3  . Glucosamine-Chondroitin (OSTEO BI-FLEX REGULAR STRENGTH) 250-200 MG TABS Take 1 tablet by mouth 2 (two) times daily.       . hydrochlorothiazide (MICROZIDE) 12.5 MG capsule Take 1 capsule (12.5 mg total) by mouth every morning.  90 capsule  1  . levothyroxine (SYNTHROID) 88 MCG tablet Take 1 tablet (88 mcg total) by mouth daily.  90 tablet  1  . Lite Touch Lancets MISC       . metoCLOPramide (REGLAN) 10 MG tablet Take 1 tablet (10 mg total) by mouth 2 (two) times daily.      . Multiple Vitamin (MULTIVITAMIN) tablet Take 1 tablet by mouth daily.      . pantoprazole (PROTONIX) 40 MG tablet Take 1 tablet (40 mg total) by mouth daily.  30 tablet  3  . sitaGLIPtan-metformin (JANUMET) 50-500 MG per tablet Take 1 tablet by mouth 2 (two) times daily with a meal.  180 tablet  1   No current facility-administered medications on file prior to visit.    Allergies  Allergen Reactions  . Epinephrine Other (See Comments)    Pass out   . Simvastatin     REACTION: back ache     Review of Systems  Review of Systems  Constitutional: Negative for fever and malaise/fatigue.  HENT: Negative for congestion.   Eyes: Negative for discharge.  Respiratory: Negative for shortness of breath.   Cardiovascular: Negative for chest pain, palpitations and leg swelling.  Gastrointestinal: Negative for nausea, abdominal pain and diarrhea.  Genitourinary: Negative for dysuria.  Musculoskeletal: Positive for joint pain. Negative for falls.  Skin: Negative for rash.  Neurological: Negative for loss of consciousness and headaches.  Endo/Heme/Allergies: Negative for polydipsia.  Psychiatric/Behavioral: Negative for depression and suicidal ideas. The patient is not nervous/anxious and does not have insomnia.     Objective  BP 100/78  Pulse 86  Temp(Src) 98.3 F (36.8 C) (Oral)  Ht 5\' 6"  (1.676 m)  Wt 169 lb 0.6 oz (76.676 kg)  BMI 27.3 kg/m2  SpO2 97%  Physical Exam  Physical Exam  Constitutional: She is oriented to person, place, and time and well-developed, well-nourished, and in no distress. No distress.  HENT:  Head: Normocephalic and atraumatic.  Eyes: Conjunctivae are normal.  Neck: Neck supple. No thyromegaly present.  Cardiovascular: Normal rate, regular rhythm and normal heart sounds.   No murmur heard. Pulmonary/Chest: Effort normal and breath sounds normal. She has no wheezes.  Abdominal: She exhibits no distension and no mass.  Musculoskeletal: She exhibits no edema.  Lymphadenopathy:    She has no cervical adenopathy.  Neurological: She is alert and oriented to person, place, and time.  Skin: Skin is warm and dry. No rash noted. She is not diaphoretic.  Psychiatric: Memory, affect and judgment normal.    Lab Results  Component Value Date   TSH 1.10 01/20/2013   Lab Results  Component Value Date   WBC 6.4 01/20/2013   HGB 13.4 01/20/2013   HCT 39.3 01/20/2013   MCV 85.2 01/20/2013   PLT 201.0 01/20/2013   Lab Results  Component Value Date    CREATININE 0.8 01/20/2013   BUN 13 01/20/2013   NA 139 01/20/2013   K 4.0 01/20/2013   CL 101 01/20/2013   CO2 29 01/20/2013   Lab Results  Component Value Date   ALT 83* 01/20/2013   AST 70* 01/20/2013   ALKPHOS 55 01/20/2013   BILITOT 0.6 01/20/2013   Lab Results  Component  Value Date   CHOL 230* 01/20/2013   Lab Results  Component Value Date   HDL 53.10 01/20/2013   Lab Results  Component Value Date   LDLCALC 114* 04/03/2012   Lab Results  Component Value Date   TRIG 145.0 01/20/2013   Lab Results  Component Value Date   CHOLHDL 4 01/20/2013     Assessment & Plan  HYPOTHYROIDISM tsh wnl, no changes to levothyroxine  Hypertension Well controlled, no changes to meds  DIABETES MELLITUS, TYPE II hgba1c is 6.6, minimize simple carbs and increase exercise.  DYSLIPIDEMIA Will try Welchol caps vs powder she is given samples of both and she will let us know which she prefers, avoid trans fats.  CTS (carpal tunnel syndrome) Ice, Aspercreme and splinting, referred to orthopaedics for further consideration

## 2013-01-27 NOTE — Assessment & Plan Note (Signed)
hgba1c is 6.6, minimize simple carbs and increase exercise.

## 2013-01-27 NOTE — Assessment & Plan Note (Signed)
Ice, Aspercreme and splinting, referred to orthopaedics for further consideration

## 2013-01-27 NOTE — Patient Instructions (Signed)
Wellchol 3 caps twice a day or 1 packet in 6-8 oz of fluid daily call and let us know which version you like  DASH Diet The DASH diet stands for "Dietary Approaches to Stop Hypertension." It is a healthy eating plan that has been shown to reduce high blood pressure (hypertension) in as little as 14 days, while also possibly providing other significant health benefits. These other health benefits include reducing the risk of breast cancer after menopause and reducing the risk of type 2 diabetes, heart disease, colon cancer, and stroke. Health benefits also include weight loss and slowing kidney failure in patients with chronic kidney disease.  DIET GUIDELINES  Limit salt (sodium). Your diet should contain less than 1500 mg of sodium daily.  Limit refined or processed carbohydrates. Your diet should include mostly whole grains. Desserts and added sugars should be used sparingly.  Include small amounts of heart-healthy fats. These types of fats include nuts, oils, and tub margarine. Limit saturated and trans fats. These fats have been shown to be harmful in the body. CHOOSING FOODS  The following food groups are based on a 2000 calorie diet. See your Registered Dietitian for individual calorie needs. Grains and Grain Products (6 to 8 servings daily)  Eat More Often: Whole-wheat bread, brown rice, whole-grain or wheat pasta, quinoa, popcorn without added fat or salt (air popped).  Eat Less Often: White bread, white pasta, white rice, cornbread. Vegetables (4 to 5 servings daily)  Eat More Often: Fresh, frozen, and canned vegetables. Vegetables may be raw, steamed, roasted, or grilled with a minimal amount of fat.  Eat Less Often/Avoid: Creamed or fried vegetables. Vegetables in a cheese sauce. Fruit (4 to 5 servings daily)  Eat More Often: All fresh, canned (in natural juice), or frozen fruits. Dried fruits without added sugar. One hundred percent fruit juice ( cup [237 mL] daily).  Eat Less  Often: Dried fruits with added sugar. Canned fruit in light or heavy syrup. [employer], Fish, and Poultry (2 servings or less daily. One serving is 3 to 4 oz [85-114 g]).  Eat More Often: Ninety percent or leaner ground beef, tenderloin, sirloin. Round cuts of beef, chicken breast, [place] breast. All fish. Grill, bake, or broil your meat. Nothing should be fried.  Eat Less Often/Avoid: Fatty cuts of meat, [place], or chicken leg, thigh, or wing. Fried cuts of meat or fish. Dairy (2 to 3 servings)  Eat More Often: Low-fat or fat-free milk, low-fat plain or light yogurt, reduced-fat or part-skim cheese.  Eat Less Often/Avoid: Milk (whole, 2%).Whole milk yogurt. Full-fat cheeses. Nuts, Seeds, and Legumes (4 to 5 servings per week)  Eat More Often: All without added salt.  Eat Less Often/Avoid: Salted nuts and seeds, canned beans with added salt. Fats and Sweets (limited)  Eat More Often: Vegetable oils, tub margarines without trans fats, sugar-free gelatin. Mayonnaise and salad dressings.  Eat Less Often/Avoid: Coconut oils, palm oils, butter, stick margarine, cream, half and half, cookies, candy, pie. FOR MORE INFORMATION The Dash Diet Eating Plan: www.dashdiet.org Document Released: 05/02/2011 Document Revised: 08/05/2011 Document Reviewed: 05/02/2011 [facility] Patient Information 2014 [place], [place].

## 2013-01-27 NOTE — Assessment & Plan Note (Signed)
Well controlled, no changes to meds 

## 2013-01-27 NOTE — Assessment & Plan Note (Signed)
Will try Welchol caps vs powder she is given samples of both and she will let us know which she prefers, avoid trans fats.

## 2013-01-27 NOTE — Assessment & Plan Note (Signed)
tsh wnl, no changes to levothyroxine

## 2013-02-22 ENCOUNTER — Encounter: Payer: Self-pay | Admitting: Family Medicine

## 2013-02-22 MED ORDER — COLESEVELAM HCL 625 MG PO TABS: 1875.0000 mg | ORAL_TABLET | Freq: Two times a day (BID) | ORAL | Status: DC

## 2013-02-24 ENCOUNTER — Encounter: Payer: Self-pay | Admitting: Family Medicine

## 2013-02-24 MED ORDER — COLESEVELAM HCL 625 MG PO TABS: 1875.0000 mg | ORAL_TABLET | Freq: Two times a day (BID) | ORAL | Status: DC

## 2013-02-24 NOTE — Telephone Encounter (Signed)
RX sent

## 2013-04-27 ENCOUNTER — Ambulatory Visit: Payer: BC Managed Care – PPO | Admitting: Family Medicine

## 2013-05-13 ENCOUNTER — Encounter: Payer: Self-pay | Admitting: Family Medicine

## 2013-05-13 DIAGNOSIS — E119 Type 2 diabetes mellitus without complications: Secondary | ICD-10-CM

## 2013-05-13 DIAGNOSIS — E039 Hypothyroidism, unspecified: Secondary | ICD-10-CM

## 2013-05-13 DIAGNOSIS — I10 Essential (primary) hypertension: Secondary | ICD-10-CM

## 2013-05-14 NOTE — Telephone Encounter (Signed)
Please advise which labs need to be ordered for [facility]?

## 2013-05-17 ENCOUNTER — Other Ambulatory Visit (INDEPENDENT_AMBULATORY_CARE_PROVIDER_SITE_OTHER): Payer: BC Managed Care – PPO

## 2013-05-17 ENCOUNTER — Telehealth: Payer: Self-pay | Admitting: *Deleted

## 2013-05-17 DIAGNOSIS — E119 Type 2 diabetes mellitus without complications: Secondary | ICD-10-CM

## 2013-05-17 DIAGNOSIS — R7989 Other specified abnormal findings of blood chemistry: Secondary | ICD-10-CM

## 2013-05-17 DIAGNOSIS — E039 Hypothyroidism, unspecified: Secondary | ICD-10-CM

## 2013-05-17 DIAGNOSIS — E876 Hypokalemia: Secondary | ICD-10-CM

## 2013-05-17 DIAGNOSIS — I10 Essential (primary) hypertension: Secondary | ICD-10-CM

## 2013-05-17 LAB — CBC
HCT: 40.1 % (ref 36.0–46.0)
Hemoglobin: 13.4 g/dL (ref 12.0–15.0)
MCV: 85 fl (ref 78.0–100.0)
RDW: 13.2 % (ref 11.5–14.6)
WBC: 7.2 10*3/uL (ref 4.5–10.5)

## 2013-05-17 LAB — LIPID PANEL
Cholesterol: 230 mg/dL — ABNORMAL HIGH (ref 0–200)
HDL: 48.7 mg/dL (ref >39.00)
Triglycerides: 136 mg/dL (ref 0.0–149.0)

## 2013-05-17 LAB — RENAL FUNCTION PANEL
Albumin: 4.3 g/dL (ref 3.5–5.2)
BUN: 14 mg/dL (ref 6–23)
Calcium: 9.3 mg/dL (ref 8.4–10.5)
Creatinine, Ser: 0.8 mg/dL (ref 0.4–1.2)
Glucose, Bld: 144 mg/dL — ABNORMAL HIGH (ref 70–99)
Phosphorus: 3.3 mg/dL (ref 2.3–4.6)
Sodium: 139 mEq/L (ref 135–145)

## 2013-05-17 LAB — TSH: TSH: 1 u[IU]/mL (ref 0.35–5.50)

## 2013-05-17 LAB — HEPATIC FUNCTION PANEL
Albumin: 4.3 g/dL (ref 3.5–5.2)
Total Protein: 7.4 g/dL (ref 6.0–8.3)

## 2013-05-17 LAB — LDL CHOLESTEROL, DIRECT: Direct LDL: 181.6 mg/dL

## 2013-05-17 LAB — HEMOGLOBIN A1C: Hgb A1c MFr Bld: 6.2 % (ref 4.6–6.5)

## 2013-05-17 MED ORDER — POTASSIUM CHLORIDE CRYS ER 20 MEQ PO TBCR: EXTENDED_RELEASE_TABLET | ORAL | Status: DC

## 2013-05-17 NOTE — Telephone Encounter (Signed)
Patient informed, understood & agreed; lab order for repeat potassium placed for [facility] lab per pt request/SLS

## 2013-05-17 NOTE — Telephone Encounter (Signed)
Tonya from Lab called with Critical Result: Potassium 2.8 Please Advise/SLS

## 2013-05-17 NOTE — Telephone Encounter (Signed)
Take 2 tabs of Kdur 20 now, repeat in 4 hours.  Tomorrow, start K dur 20 once daily. Repeat K+ tomorrow afternoon. Dx hypokalemia.

## 2013-05-18 ENCOUNTER — Other Ambulatory Visit (INDEPENDENT_AMBULATORY_CARE_PROVIDER_SITE_OTHER): Payer: BC Managed Care – PPO

## 2013-05-18 ENCOUNTER — Encounter: Payer: Self-pay | Admitting: Family Medicine

## 2013-05-18 DIAGNOSIS — E876 Hypokalemia: Secondary | ICD-10-CM

## 2013-05-18 LAB — RENAL FUNCTION PANEL
BUN: 10 mg/dL (ref 6–23)
Creatinine, Ser: 0.7 mg/dL (ref 0.4–1.2)
GFR: 85.48 mL/min (ref >60.00)
Glucose, Bld: 111 mg/dL — ABNORMAL HIGH (ref 70–99)
Phosphorus: 2.9 mg/dL (ref 2.3–4.6)
Sodium: 141 mEq/L (ref 135–145)

## 2013-05-18 NOTE — Telephone Encounter (Signed)
I also informed pt. Pt states she is going to the [facility] lab for this to be drawn. I changed lab order. Potassium Rich Food list mailed to pt.

## 2013-05-25 ENCOUNTER — Telehealth: Payer: Self-pay | Admitting: Family Medicine

## 2013-05-25 ENCOUNTER — Ambulatory Visit (INDEPENDENT_AMBULATORY_CARE_PROVIDER_SITE_OTHER): Payer: BC Managed Care – PPO | Admitting: Family Medicine

## 2013-05-25 ENCOUNTER — Encounter: Payer: Self-pay | Admitting: Family Medicine

## 2013-05-25 VITALS — BP 132/72 | HR 72 | Temp 98.2°F | Ht 66.0 in | Wt 171.1 lb

## 2013-05-25 DIAGNOSIS — K7689 Other specified diseases of liver: Secondary | ICD-10-CM

## 2013-05-25 DIAGNOSIS — E039 Hypothyroidism, unspecified: Secondary | ICD-10-CM

## 2013-05-25 DIAGNOSIS — E876 Hypokalemia: Secondary | ICD-10-CM

## 2013-05-25 DIAGNOSIS — E119 Type 2 diabetes mellitus without complications: Secondary | ICD-10-CM

## 2013-05-25 DIAGNOSIS — E785 Hyperlipidemia, unspecified: Secondary | ICD-10-CM

## 2013-05-25 DIAGNOSIS — I10 Essential (primary) hypertension: Secondary | ICD-10-CM

## 2013-05-25 DIAGNOSIS — K219 Gastro-esophageal reflux disease without esophagitis: Secondary | ICD-10-CM

## 2013-05-25 HISTORY — DX: Hypokalemia: E87.6

## 2013-05-25 MED ORDER — TRIAMTERENE-HCTZ 37.5-25 MG PO TABS: 1.0000 | ORAL_TABLET | Freq: Every day | ORAL | Status: DC

## 2013-05-25 NOTE — Telephone Encounter (Signed)
Lab order week of 08/17/13  comments: Labs prior to visit, lipid, renal, cbc, tsh, hgba1c, hepatic. Patient will be going to [facility] lab

## 2013-05-25 NOTE — Assessment & Plan Note (Signed)
Well treated 

## 2013-05-25 NOTE — Patient Instructions (Addendum)
sys 100-140 dias 60-90  Red Yeast Rice every other day and CoQ 10 enzyme daily Cholest off   Basic Carbohydrate Counting Basic carbohydrate counting is a way to plan meals. It is done by counting the amount of carbohydrate in foods. Foods that have carbohydrates are starches (grains, beans, starchy vegetables) and sweets. Eating carbohydrates increases blood glucose (sugar) levels. People with diabetes use carbohydrate counting to help keep their blood glucose at a normal level.  COUNTING CARBOHYDRATES IN FOODS The first step in counting carbohydrates is to learn how many carbohydrate servings you should have in every meal. A dietitian can plan this for you. After learning the amount of carbohydrates to include in your meal plan, you can start to choose the carbohydrate-containing foods you want to eat.  There are 2 ways to identify the amount of carbohydrates in the foods you eat.  Read the Nutrition Facts panel on food labels. You need 2 pieces of information from the Nutrition Facts panel to count carbohydrates this way:  Serving size.  Total carbohydrate (in grams). Decide how many servings you will be eating. If it is 1 serving, you will be eating the amount of carbohydrate listed on the panel. If you will be eating 2 servings, you will be eating double the amount of carbohydrate listed on the panel.   Learn serving sizes. A serving size of most carbohydrate-containing foods is about 15 grams (g). Listed below are single serving sizes of common carbohydrate-containing foods:  1 slice bread.   cup unsweetened, dry cereal.   cup hot cereal.   cup rice.   cup mashed potatoes.   cup pasta.  1 cup fresh fruit.   cup canned fruit.  1 cup milk (whole, 2%, or skim).   cup starchy vegetables (peas, corn, or potatoes). Counting carbohydrates this way is similar to looking on the Nutrition Facts panel. Decide how many servings you will eat first. Multiply the number of  servings you eat by 15 g. For example, if you have 2 cups of strawberries, you had 2 servings. That means you had 30 g of carbohydrate (2 servings x 15 g = 30 g). CALCULATING CARBOHYDRATES IN A MEAL Sample dinner  3 oz chicken breast.   cup brown rice.   cup corn.  1 cup fat-free milk.  1 cup strawberries with sugar-free whipped topping. Carbohydrate calculation First, identify the foods that contain carbohydrate:  Rice.  Corn.  Milk.  Strawberries. Calculate the number of servings eaten:  2 servings rice.  1 serving corn.  1 serving milk.  1 serving strawberries. Multiply the number of servings by 15 g:  2 servings rice x 15 g = 30 g.  1 serving corn x 15 g = 15 g.  1 serving milk x 15 g = 15 g.  1 serving strawberries x 15 g = 15 g. Add the amounts to find the total carbohydrates eaten: 30 g + 15 g + 15 g + 15 g = 75 g carbohydrate eaten at dinner. Document Released: 05/13/2005 Document Revised: 08/05/2011 Document Reviewed: 03/29/2011 [facility] Patient Information 2014 [place], [place].

## 2013-05-25 NOTE — Assessment & Plan Note (Signed)
Well controlled but increased edema without HCTZ. Will start Maxzide and reassess at next visit

## 2013-05-25 NOTE — Progress Notes (Signed)
Pre visit review using our clinic review tool, if applicable. No additional management support is needed unless otherwise documented below in the visit note. 

## 2013-05-25 NOTE — Telephone Encounter (Signed)
Labs ordered.

## 2013-05-25 NOTE — Assessment & Plan Note (Signed)
stable °

## 2013-05-25 NOTE — Progress Notes (Signed)
Patient ID: [name] [name], female   DOB: 1955-09-15, 57 y.o.   MRN: [id number] EDESSA JAKUBOWICZ [id number] Sep 21, 1955 05/25/2013      Progress Note-Follow Up  Subjective  Chief Complaint  Chief Complaint  Patient presents with  . Follow-up    3 month    HPI  Patient is a 42 yeawr old Caucasian female who is in today for routine followup. She was noted to have a low potassium on her last blood draw. She has frequent diarrhea does not note having any prior to the blood work but did have some yesterday. No fevers or chills. No bloody or tarry stool. Denies any other acute complaints. No chest pain, muscle cramps, palpitations, shortness of breath or GU concerns noted. Taking medications as prescribed. Stop will call us visit due to myalgias. Does not tolerate statins. Continues to maintain a low carbohydrate diet.  Past Medical History  Diagnosis Date  . Hypothyroidism   . Diabetes mellitus type II   . Fatty liver     abnormal transaminases; negative work up  . Atypical chest pain     normal coronaries and LV function by cath 10/2011  . GERD (gastroesophageal reflux disease)   . Osteoarthritis     chronic, right knee  . Plantar fasciitis   . Chronic pain disorder     neck and shoulder  . HTN (hypertension)   . HLD (hyperlipidemia)   . Asthma   . Hip pain, bilateral 07/19/2012  . Other malaise and fatigue 01/27/2013  . CTS (carpal tunnel syndrome) 01/27/2013    right    Past Surgical History  Procedure Laterality Date  . Cholecystectomy    . Tonsillectomy  1975  . Knee arthroscopy  1998, 2007  . Vesicovaginal fistula closure w/ tah  1995  . Abdominal hysterectomy  1982    Family History  Problem Relation Age of Onset  . Alzheimer's disease Mother   . Diabetes type II Mother   . Hiatal hernia Mother   . Diabetes Mother   . Emphysema Father   . Cancer Father     leukemia  . Colon cancer Neg Hx   . Depression Sister     suicide  . Diabetes Sister     History    Social History  . Marital Status: Married    Spouse Name: N/A    Number of Children: 1  . 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 Smoking status: Never Smoker   . Smokeless tobacco: Never Used  . Alcohol Use: No  . Drug Use: No  . Sexual Activity: No   Other Topics Concern  . Not on file   Social History Narrative   Multiple family members with intolerance to statins.    Current Outpatient Prescriptions on File Prior to Visit  Medication Sig Dispense Refill  . albuterol (PROVENTIL,VENTOLIN) 90 MCG/ACT inhaler Inhale 2 puffs into the lungs every 6 (six) hours as needed. For shortness of breath/wheezing      . Alcohol Swabs (ALCOHOL PADS) 70 % PADS       . aspirin 81 MG tablet Take 81 mg by mouth daily.        Marland Kitchen diltiazem (CARDIZEM SR) 60 MG 12 hr capsule Take 1 capsule (60 mg total) by mouth daily.      . Fluticasone-Salmeterol (ADVAIR DISKUS) 250-50 MCG/DOSE AEPB INHALE 1 PUFF INTO LUNGS 2 TIMES DAILY  3  each  3  . FORA V12 BLOOD GLUCOSE TEST test strip       . glimepiride (AMARYL) 4 MG tablet Take 1 tablet (4 mg total) by mouth daily.  90 tablet  3  . Glucosamine-Chondroitin (OSTEO BI-FLEX REGULAR STRENGTH) 250-200 MG TABS Take 1 tablet by mouth 2 (two) times daily.       . hydrochlorothiazide (MICROZIDE) 12.5 MG capsule Take 1 capsule (12.5 mg total) by mouth every morning.  90 capsule  1  . levothyroxine (SYNTHROID) 88 MCG tablet Take 1 tablet (88 mcg total) by mouth daily.  90 tablet  1  . Lite Touch Lancets MISC       . metoCLOPramide (REGLAN) 10 MG tablet Take 1 tablet (10 mg total) by mouth 2 (two) times daily.      . Multiple Vitamin (MULTIVITAMIN) tablet Take 1 tablet by mouth daily.      . pantoprazole (PROTONIX) 40 MG tablet Take 1 tablet (40 mg total) by mouth daily.  30 tablet  3  . sitaGLIPtan-metformin (JANUMET) 50-500 MG per tablet Take 1 tablet by mouth 2 (two) times daily with a meal.  180 tablet  1   No  current facility-administered medications on file prior to visit.    Allergies  Allergen Reactions  . Epinephrine Other (See Comments)    Pass out   . Simvastatin     REACTION: back ache    Review of Systems  Review of Systems  Constitutional: Negative for fever and malaise/fatigue.  HENT: Negative for congestion.   Eyes: Negative for discharge.  Respiratory: Negative for shortness of breath.   Cardiovascular: Negative for chest pain, palpitations and leg swelling.  Gastrointestinal: Positive for diarrhea. Negative for nausea and abdominal pain.  Genitourinary: Negative for dysuria.  Musculoskeletal: Negative for falls.  Skin: Negative for rash.  Neurological: Negative for loss of consciousness and headaches.  Endo/Heme/Allergies: Negative for polydipsia.  Psychiatric/Behavioral: Negative for depression and suicidal ideas. The patient is not nervous/anxious and does not have insomnia.     Objective  BP 132/72  Pulse 72  Temp(Src) 98.2 F (36.8 C) (Oral)  Ht 5\' 6"  (1.676 m)  Wt 171 lb 1.3 oz (77.601 kg)  BMI 27.63 kg/m2  SpO2 96%  Physical Exam  Physical Exam  Constitutional: She is oriented to person, place, and time and well-developed, well-nourished, and in no distress. No distress.  HENT:  Head: Normocephalic and atraumatic.  Eyes: Conjunctivae are normal.  Neck: Neck supple. No thyromegaly present.  Cardiovascular: Normal rate, regular rhythm and normal heart sounds.   No murmur heard. Pulmonary/Chest: Effort normal and breath sounds normal. She has no wheezes.  Abdominal: She exhibits no distension and no mass.  Musculoskeletal: She exhibits no edema.  Lymphadenopathy:    She has no cervical adenopathy.  Neurological: She is alert and oriented to person, place, and time.  Skin: Skin is warm and dry. No rash noted. She is not diaphoretic.  Psychiatric: Memory, affect and judgment normal.    Lab Results  Component Value Date   TSH 1.00 05/17/2013    Lab Results  Component Value Date   WBC 7.2 05/17/2013   HGB 13.4 05/17/2013   HCT 40.1 05/17/2013   MCV 85.0 05/17/2013   PLT 209.0 05/17/2013   Lab Results  Component Value Date   CREATININE 0.7 05/18/2013   BUN 10 05/18/2013   NA 141 05/18/2013   K 3.5 05/18/2013   CL 100 05/18/2013   CO2 32 05/18/2013  Lab Results  Component Value Date   ALT 66* 05/17/2013   AST 53* 05/17/2013   ALKPHOS 57 05/17/2013   BILITOT 0.8 05/17/2013   Lab Results  Component Value Date   CHOL 230* 05/17/2013   Lab Results  Component Value Date   HDL 48.70 05/17/2013   Lab Results  Component Value Date   LDLCALC 114* 04/03/2012   Lab Results  Component Value Date   TRIG 136.0 05/17/2013   Lab Results  Component Value Date   CHOLHDL 5 05/17/2013     Assessment & Plan  Hypertension Well controlled but increased edema without HCTZ. Will start Maxzide and reassess at next visit  FATTY LIVER DISEASE stable  GERD (gastroesophageal reflux disease) Encouraged to alternate different brands of probiotic s and avoid offending foods  HYPOTHYROIDISM Well treated  DIABETES MELLITUS, TYPE II hgba1c improved to 6.2, no changes

## 2013-05-25 NOTE — Assessment & Plan Note (Signed)
Encouraged to alternate different brands of probiotic s and avoid offending foods

## 2013-05-25 NOTE — Assessment & Plan Note (Signed)
hgba1c improved to 6.2, no changes

## 2013-06-08 ENCOUNTER — Other Ambulatory Visit: Payer: Self-pay | Admitting: Family Medicine

## 2013-07-12 ENCOUNTER — Telehealth: Payer: Self-pay | Admitting: Family Medicine

## 2013-07-12 DIAGNOSIS — I10 Essential (primary) hypertension: Secondary | ICD-10-CM

## 2013-07-12 MED ORDER — TRIAMTERENE-HCTZ 37.5-25 MG PO TABS: 1.0000 | ORAL_TABLET | Freq: Every day | ORAL | Status: DC

## 2013-07-12 NOTE — Telephone Encounter (Signed)
90 day supply request  tramterene hctz

## 2013-08-15 ENCOUNTER — Other Ambulatory Visit: Payer: Self-pay | Admitting: Family Medicine

## 2013-08-16 ENCOUNTER — Encounter: Payer: Self-pay | Admitting: Family Medicine

## 2013-08-19 ENCOUNTER — Other Ambulatory Visit (INDEPENDENT_AMBULATORY_CARE_PROVIDER_SITE_OTHER): Payer: BC Managed Care – PPO

## 2013-08-19 DIAGNOSIS — E039 Hypothyroidism, unspecified: Secondary | ICD-10-CM

## 2013-08-19 DIAGNOSIS — E876 Hypokalemia: Secondary | ICD-10-CM

## 2013-08-19 DIAGNOSIS — K7689 Other specified diseases of liver: Secondary | ICD-10-CM

## 2013-08-19 DIAGNOSIS — I10 Essential (primary) hypertension: Secondary | ICD-10-CM

## 2013-08-19 DIAGNOSIS — E119 Type 2 diabetes mellitus without complications: Secondary | ICD-10-CM

## 2013-08-19 DIAGNOSIS — E785 Hyperlipidemia, unspecified: Secondary | ICD-10-CM

## 2013-08-19 LAB — HEPATIC FUNCTION PANEL
ALBUMIN: 4.4 g/dL (ref 3.5–5.2)
ALT: 66 U/L — ABNORMAL HIGH (ref 0–35)
AST: 52 U/L — ABNORMAL HIGH (ref 0–37)
Alkaline Phosphatase: 57 U/L (ref 39–117)
BILIRUBIN TOTAL: 0.8 mg/dL (ref 0.3–1.2)
Bilirubin, Direct: 0.1 mg/dL (ref 0.0–0.3)
Total Protein: 7.4 g/dL (ref 6.0–8.3)

## 2013-08-19 LAB — RENAL FUNCTION PANEL
ALBUMIN: 4.4 g/dL (ref 3.5–5.2)
BUN: 15 mg/dL (ref 6–23)
CO2: 32 meq/L (ref 19–32)
CREATININE: 1.1 mg/dL (ref 0.4–1.2)
Calcium: 9.5 mg/dL (ref 8.4–10.5)
Chloride: 99 mEq/L (ref 96–112)
GFR: 51.87 mL/min — ABNORMAL LOW (ref >60.00)
Glucose, Bld: 157 mg/dL — ABNORMAL HIGH (ref 70–99)
Phosphorus: 3.1 mg/dL (ref 2.3–4.6)
Potassium: 3.9 mEq/L (ref 3.5–5.1)
SODIUM: 139 meq/L (ref 135–145)

## 2013-08-19 LAB — CBC
HCT: 38.6 % (ref 36.0–46.0)
HEMOGLOBIN: 13 g/dL (ref 12.0–15.0)
MCHC: 33.8 g/dL (ref 30.0–36.0)
MCV: 88.1 fl (ref 78.0–100.0)
Platelets: 222 10*3/uL (ref 150.0–400.0)
RBC: 4.38 Mil/uL (ref 3.87–5.11)
RDW: 13.9 % (ref 11.5–14.6)
WBC: 7.2 10*3/uL (ref 4.5–10.5)

## 2013-08-19 LAB — LIPID PANEL
CHOLESTEROL: 236 mg/dL — AB (ref 0–200)
HDL: 51.6 mg/dL (ref >39.00)
LDL Cholesterol: 136 mg/dL — ABNORMAL HIGH (ref 0–99)
TRIGLYCERIDES: 242 mg/dL — AB (ref 0.0–149.0)
Total CHOL/HDL Ratio: 5
VLDL: 48.4 mg/dL — AB (ref 0.0–40.0)

## 2013-08-19 LAB — TSH: TSH: 0.93 u[IU]/mL (ref 0.35–5.50)

## 2013-08-19 LAB — HEMOGLOBIN A1C: HEMOGLOBIN A1C: 6.4 % (ref 4.6–6.5)

## 2013-08-24 ENCOUNTER — Encounter: Payer: Self-pay | Admitting: Family Medicine

## 2013-08-24 ENCOUNTER — Telehealth: Payer: Self-pay | Admitting: Family Medicine

## 2013-08-24 ENCOUNTER — Ambulatory Visit (INDEPENDENT_AMBULATORY_CARE_PROVIDER_SITE_OTHER): Payer: BC Managed Care – PPO | Admitting: Family Medicine

## 2013-08-24 VITALS — BP 102/80 | HR 89 | Temp 98.1°F | Ht 66.0 in | Wt 166.0 lb

## 2013-08-24 DIAGNOSIS — R7401 Elevation of levels of liver transaminase levels: Secondary | ICD-10-CM

## 2013-08-24 DIAGNOSIS — J45909 Unspecified asthma, uncomplicated: Secondary | ICD-10-CM

## 2013-08-24 DIAGNOSIS — E119 Type 2 diabetes mellitus without complications: Secondary | ICD-10-CM

## 2013-08-24 DIAGNOSIS — K219 Gastro-esophageal reflux disease without esophagitis: Secondary | ICD-10-CM

## 2013-08-24 DIAGNOSIS — I10 Essential (primary) hypertension: Secondary | ICD-10-CM

## 2013-08-24 DIAGNOSIS — E785 Hyperlipidemia, unspecified: Secondary | ICD-10-CM

## 2013-08-24 DIAGNOSIS — R740 Nonspecific elevation of levels of transaminase and lactic acid dehydrogenase [LDH]: Secondary | ICD-10-CM

## 2013-08-24 DIAGNOSIS — E039 Hypothyroidism, unspecified: Secondary | ICD-10-CM

## 2013-08-24 MED ORDER — NIACIN ER (ANTIHYPERLIPIDEMIC) 500 MG PO TBCR: EXTENDED_RELEASE_TABLET | ORAL | Status: DC

## 2013-08-24 NOTE — Progress Notes (Signed)
Patient ID: [name] [name], female   DOB: 05-29-1955, 58 y.o.   MRN: [id number] [name] [name] [id number] 1955/06/15 08/24/2013      Progress Note-Follow Up  Subjective  Chief Complaint  Chief Complaint  Patient presents with  . Follow-up    3 month    HPI  Patient is a 58 year old female in today for routine medical care. Fairly well. Reports blisters in them well controlled recently. No polyuria or polydipsia. No recent illness. Denies CP/palp/SOB/HA/congestion/fevers/GI or GU c/o. Taking meds as prescribed  Past Medical History  Diagnosis Date  . Hypothyroidism   . Diabetes mellitus type II   . Fatty liver     abnormal transaminases; negative work up  . Atypical chest pain     normal coronaries and LV function by cath 10/2011  . GERD (gastroesophageal reflux disease)   . Osteoarthritis     chronic, right knee  . Plantar fasciitis   . Chronic pain disorder     neck and shoulder  . HTN (hypertension)   . HLD (hyperlipidemia)   . Asthma   . Hip pain, bilateral 07/19/2012  . Other malaise and fatigue 01/27/2013  . CTS (carpal tunnel syndrome) 01/27/2013    right  . Hypokalemia 05/25/2013    Improved stopping HCTZ. Was noted to have an elevated glucose when K was low. Recheck renal next week after starting Maxzide    Past Surgical History  Procedure Laterality Date  . Cholecystectomy    . Tonsillectomy  1975  . Knee arthroscopy  1998, 2007  . Vesicovaginal fistula closure w/ tah  1995  . Abdominal hysterectomy  1982    Family History  Problem Relation Age of Onset  . Alzheimer's disease Mother   . Diabetes type II Mother   . Hiatal hernia Mother   . Diabetes Mother   . Emphysema Father   . Cancer Father     leukemia  . Colon cancer Neg Hx   . Depression Sister     suicide  . Diabetes Sister     History   Social History  . Marital Status: Married    Spouse Name: N/A    Number of Children: 1  . 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 Smoking status: Never Smoker   . Smokeless tobacco: Never Used  . Alcohol Use: No  . Drug Use: No  . Sexual Activity: No   Other Topics Concern  . Not on file   Social History Narrative   Multiple family members with intolerance to statins.    Current Outpatient Prescriptions on File Prior to Visit  Medication Sig Dispense Refill  . albuterol (PROVENTIL,VENTOLIN) 90 MCG/ACT inhaler Inhale 2 puffs into the lungs every 6 (six) hours as needed. For shortness of breath/wheezing      . Alcohol Swabs (ALCOHOL PADS) 70 % PADS       . aspirin 81 MG tablet Take 81 mg by mouth daily.        Marland Kitchen diltiazem (CARDIZEM SR) 60 MG 12 hr capsule Take 1 capsule (60 mg total) by mouth daily.      . Fluticasone-Salmeterol (ADVAIR DISKUS) 250-50 MCG/DOSE AEPB INHALE 1 PUFF INTO LUNGS 2 TIMES DAILY  3 each  3  . FORA V12 BLOOD GLUCOSE TEST test strip       . glimepiride (AMARYL) 4 MG tablet Take 1 tablet (4 mg total) by  mouth daily.  90 tablet  3  . Glucosamine-Chondroitin (OSTEO BI-FLEX REGULAR STRENGTH) 250-200 MG TABS Take 1 tablet by mouth 2 (two) times daily.       Marland Kitchen JANUMET 50-500 MG per tablet TAKE 1 TABLET TWICE A DAY  WITH MEALS  180 tablet  1  . Lite Touch Lancets MISC       . metoCLOPramide (REGLAN) 10 MG tablet Take 1 tablet (10 mg total) by mouth 2 (two) times daily.      . Multiple Vitamin (MULTIVITAMIN) tablet Take 1 tablet by mouth daily.      . pantoprazole (PROTONIX) 40 MG tablet Take 1 tablet (40 mg total) by mouth daily.  30 tablet  3  . SYNTHROID 88 MCG tablet TAKE 1 TABLET DAILY  90 tablet  1  . triamterene-hydrochlorothiazide (MAXZIDE-25) 37.5-25 MG per tablet Take 1 tablet by mouth daily.  90 tablet  0   No current facility-administered medications on file prior to visit.    Allergies  Allergen Reactions  . Epinephrine Other (See Comments)    Pass out   . Simvastatin     REACTION: back ache  . Statins     myalgias  . Welchol [Colesevelam  Hcl]     myalgia    Review of Systems  Review of Systems  Constitutional: Negative for fever and malaise/fatigue.  HENT: Negative for congestion.   Eyes: Negative for discharge.  Respiratory: Negative for shortness of breath.   Cardiovascular: Negative for chest pain, palpitations and leg swelling.  Gastrointestinal: Negative for nausea, abdominal pain and diarrhea.  Genitourinary: Negative for dysuria.  Musculoskeletal: Negative for falls.  Skin: Negative for rash.  Neurological: Negative for loss of consciousness and headaches.  Endo/Heme/Allergies: Negative for polydipsia.  Psychiatric/Behavioral: Negative for depression and suicidal ideas. The patient is not nervous/anxious and does not have insomnia.     Objective  BP 102/80  Pulse 89  Temp(Src) 98.1 F (36.7 C) (Oral)  Ht 5\' 6"  (1.676 m)  Wt 166 lb 0.6 oz (75.315 kg)  BMI 26.81 kg/m2  SpO2 96%  Physical Exam  Physical Exam  Constitutional: She is oriented to person, place, and time and well-developed, well-nourished, and in no distress. No distress.  HENT:  Head: Normocephalic and atraumatic.  Eyes: Conjunctivae are normal.  Neck: Neck supple. No thyromegaly present.  Cardiovascular: Normal rate, regular rhythm and normal heart sounds.   No murmur heard. Pulmonary/Chest: Effort normal and breath sounds normal. She has no wheezes.  Abdominal: She exhibits no distension and no mass.  Musculoskeletal: She exhibits no edema.  Lymphadenopathy:    She has no cervical adenopathy.  Neurological: She is alert and oriented to person, place, and time.  Skin: Skin is warm and dry. No rash noted. She is not diaphoretic.  Psychiatric: Memory, affect and judgment normal.    Lab Results  Component Value Date   TSH 0.93 08/19/2013   Lab Results  Component Value Date   WBC 7.2 08/19/2013   HGB 13.0 08/19/2013   HCT 38.6 08/19/2013   MCV 88.1 08/19/2013   PLT 222.0 08/19/2013   Lab Results  Component Value Date    CREATININE 1.1 08/19/2013   BUN 15 08/19/2013   NA 139 08/19/2013   K 3.9 08/19/2013   CL 99 08/19/2013   CO2 32 08/19/2013   Lab Results  Component Value Date   ALT 66* 08/19/2013   AST 52* 08/19/2013   ALKPHOS 57 08/19/2013   BILITOT 0.8 08/19/2013  Lab Results  Component Value Date   CHOL 236* 08/19/2013   Lab Results  Component Value Date   HDL 51.60 08/19/2013   Lab Results  Component Value Date   LDLCALC 136* 08/19/2013   Lab Results  Component Value Date   TRIG 242.0* 08/19/2013   Lab Results  Component Value Date   CHOLHDL 5 08/19/2013     Assessment & Plan  Hypertension Well controlled, no changes to meds. Encouraged heart healthy diet such as the DASH diet and exercise as tolerated.   NONSPEC ELEVATION OF LEVELS OF TRANSAMINASE/LDH Mild, stable. Encouraged heart healthy diet, increase exercise, avoid trans fats, consider a krill oil cap daily  DIABETES MELLITUS, TYPE II hgba1c acceptable, minimize simple carbs. Increase exercise as tolerated. Continue current meds  Asthma No recent flares  HYPOTHYROIDISM On Levothyroxine, continue to monitor  GERD (gastroesophageal reflux disease) Well treated on Protonix, Avoid offending foods, start probiotics. Do not eat large meals in late evening and consider raising head of bed.

## 2013-08-24 NOTE — Telephone Encounter (Signed)
Return in about 4 months (around 12/24/2013) for follow up DM, HTN, hi chol, labs prior, lipid, renal, cbc, tsh, hepatic, hgba1c prior.    Patient will be going to [facility] lab

## 2013-08-24 NOTE — Patient Instructions (Signed)

## 2013-08-24 NOTE — Progress Notes (Signed)
Pre visit review using our clinic review tool, if applicable. No additional management support is needed unless otherwise documented below in the visit note. 

## 2013-08-24 NOTE — Telephone Encounter (Signed)
Relevant patient education mailed to patient.  

## 2013-08-25 ENCOUNTER — Encounter: Payer: Self-pay | Admitting: Family Medicine

## 2013-08-25 DIAGNOSIS — K219 Gastro-esophageal reflux disease without esophagitis: Secondary | ICD-10-CM

## 2013-08-25 HISTORY — DX: Gastro-esophageal reflux disease without esophagitis: K21.9

## 2013-08-25 NOTE — Assessment & Plan Note (Signed)
No recent flares 

## 2013-08-25 NOTE — Assessment & Plan Note (Signed)
hgba1c acceptable, minimize simple carbs. Increase exercise as tolerated. Continue current meds 

## 2013-08-25 NOTE — Assessment & Plan Note (Signed)
Well treated on Protonix, Avoid offending foods, start probiotics. Do not eat large meals in late evening and consider raising head of bed.

## 2013-08-25 NOTE — Assessment & Plan Note (Signed)
Well controlled, no changes to meds. Encouraged heart healthy diet such as the DASH diet and exercise as tolerated.  °

## 2013-08-25 NOTE — Assessment & Plan Note (Signed)
Mild, stable. Encouraged heart healthy diet, increase exercise, avoid trans fats, consider a krill oil cap daily

## 2013-08-25 NOTE — Assessment & Plan Note (Signed)
On Levothyroxine, continue to monitor 

## 2013-09-02 ENCOUNTER — Other Ambulatory Visit: Payer: Self-pay

## 2013-09-06 ENCOUNTER — Telehealth: Payer: Self-pay

## 2013-09-06 NOTE — Telephone Encounter (Signed)
Relevant patient education assigned to patient using Emmi. ° °

## 2013-10-19 ENCOUNTER — Encounter: Payer: Self-pay | Admitting: Family Medicine

## 2013-10-19 DIAGNOSIS — I10 Essential (primary) hypertension: Secondary | ICD-10-CM

## 2013-10-20 MED ORDER — TRIAMTERENE-HCTZ 37.5-25 MG PO TABS: 1.0000 | ORAL_TABLET | Freq: Every day | ORAL | Status: DC

## 2013-11-05 ENCOUNTER — Ambulatory Visit (INDEPENDENT_AMBULATORY_CARE_PROVIDER_SITE_OTHER): Payer: BC Managed Care – PPO | Admitting: Family

## 2013-11-05 ENCOUNTER — Encounter: Payer: Self-pay | Admitting: Family

## 2013-11-05 VITALS — BP 108/80 | HR 96 | Temp 97.7°F | Resp 16 | Ht 66.0 in | Wt 166.0 lb

## 2013-11-05 DIAGNOSIS — N764 Abscess of vulva: Secondary | ICD-10-CM

## 2013-11-05 MED ORDER — DOXYCYCLINE HYCLATE 100 MG PO TABS: 100.0000 mg | ORAL_TABLET | Freq: Two times a day (BID) | ORAL | Status: DC

## 2013-11-05 NOTE — Patient Instructions (Addendum)
Start doxycycline. Soak in warm tub or apply warm compresses twice daily. Call if increased pain/swelling, redness or fever. Follow up in 1 week.

## 2013-11-05 NOTE — Progress Notes (Signed)
Pre visit review using our clinic review tool, if applicable. No additional management support is needed unless otherwise documented below in the visit note. 

## 2013-11-05 NOTE — Progress Notes (Signed)
Subjective:    Patient ID: [name] [name], female    DOB: Mar 13, 1956, 58 y.o.   MRN: [id number]  HPI  [name] [name] is a 58 yr old female with hx of DM2 who presents today with chief complaint of "cyst" in groin.  Present x 5 days. Denies associated fever.  + associated tenderness making it hard to sit.    Review of Systems See HPI  Past Medical History  Diagnosis Date  . Hypothyroidism   . Diabetes mellitus type II   . Fatty liver     abnormal transaminases; negative work up  . Atypical chest pain     normal coronaries and LV function by cath 10/2011  . GERD (gastroesophageal reflux disease)   . Osteoarthritis     chronic, right knee  . Plantar fasciitis   . Chronic pain disorder     neck and shoulder  . HTN (hypertension)   . HLD (hyperlipidemia)   . Asthma   . Hip pain, bilateral 07/19/2012  . Other malaise and fatigue 01/27/2013  . CTS (carpal tunnel syndrome) 01/27/2013    right  . Hypokalemia 05/25/2013    Improved stopping HCTZ. Was noted to have an elevated glucose when K was low. Recheck renal next week after starting Maxzide  . Esophageal reflux 08/25/2013    [facility], Dr Barth Kirks    History   Social History  . Marital Status: Married    Spouse Name: N/A    Number of Children: 1  . 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 Smoking status: Never Smoker   . Smokeless tobacco: Never Used  . Alcohol Use: No  . Drug Use: No  . Sexual Activity: No   Other Topics Concern  . Not on file   Social History Narrative   Multiple family members with intolerance to statins.    Past Surgical History  Procedure Laterality Date  . Cholecystectomy    . Tonsillectomy  1975  . Knee arthroscopy  1998, 2007  . Vesicovaginal fistula closure w/ tah  1995  . Abdominal hysterectomy  1982    Family History  Problem Relation Age of Onset  . Alzheimer's disease Mother   . Diabetes type II Mother   . Hiatal  hernia Mother   . Diabetes Mother   . Emphysema Father   . Cancer Father     leukemia  . Colon cancer Neg Hx   . Depression Sister     suicide  . Diabetes Sister     Allergies  Allergen Reactions  . Epinephrine Other (See Comments)    Pass out   . Simvastatin     REACTION: back ache  . Statins     myalgias  . Welchol [Colesevelam Hcl]     myalgia    Current Outpatient Prescriptions on File Prior to Visit  Medication Sig Dispense Refill  . albuterol (PROVENTIL,VENTOLIN) 90 MCG/ACT inhaler Inhale 2 puffs into the lungs every 6 (six) hours as needed. For shortness of breath/wheezing      . Alcohol Swabs (ALCOHOL PADS) 70 % PADS       . aspirin 81 MG tablet Take 81 mg by mouth daily.        Marland Kitchen diltiazem (CARDIZEM SR) 60 MG 12 hr capsule Take 1 capsule (60 mg total) by mouth daily.      . Fluticasone-Salmeterol (ADVAIR DISKUS) 250-50 MCG/DOSE AEPB INHALE 1  PUFF INTO LUNGS 2 TIMES DAILY  3 each  3  . FORA V12 BLOOD GLUCOSE TEST test strip       . glimepiride (AMARYL) 4 MG tablet Take 1 tablet (4 mg total) by mouth daily.  90 tablet  3  . Glucosamine-Chondroitin (OSTEO BI-FLEX REGULAR STRENGTH) 250-200 MG TABS Take 1 tablet by mouth 2 (two) times daily.       Marland Kitchen JANUMET 50-500 MG per tablet TAKE 1 TABLET TWICE A DAY  WITH MEALS  180 tablet  1  . Lite Touch Lancets MISC       . metoCLOPramide (REGLAN) 10 MG tablet Take 1 tablet (10 mg total) by mouth 2 (two) times daily.      . Multiple Vitamin (MULTIVITAMIN) tablet Take 1 tablet by mouth daily.      . niacin (NIASPAN) 500 MG CR tablet 1 tab po qhs with lowfat snack and Aspirin 1/2 hour prior  Failed all statins and welchol  30 tablet  5  . pantoprazole (PROTONIX) 40 MG tablet Take 1 tablet (40 mg total) by mouth daily.  30 tablet  3  . SYNTHROID 88 MCG tablet TAKE 1 TABLET DAILY  90 tablet  1  . triamterene-hydrochlorothiazide (MAXZIDE-25) 37.5-25 MG per tablet Take 1 tablet by mouth daily.  90 tablet  1   No current  facility-administered medications on file prior to visit.    BP 108/80  Pulse 96  Temp(Src) 97.7 F (36.5 C) (Oral)  Resp 16  Ht 5\' 6"  (1.676 m)  Wt 166 lb (75.297 kg)  BMI 26.81 kg/m2  SpO2 98%       Objective:   Physical Exam  Constitutional: She is oriented to person, place, and time. She appears well-developed and well-nourished. No distress.  HENT:  Head: Normocephalic and atraumatic.  Musculoskeletal: She exhibits no edema.  Neurological: She is alert and oriented to person, place, and time.  Skin:  Approximately 1 cm wide tender induration/abscess with slight amount of fluctuance noted in center.   Psychiatric: She has a normal mood and affect. Her behavior is normal. Judgment and thought content normal.          Assessment & Plan:

## 2013-11-05 NOTE — Assessment & Plan Note (Addendum)
Will rx with doxycycline. Advised pt to:  Soak in warm tub or apply warm compresses twice daily. Hopefully will begin to drain spontaneously in next few days.  If not , may require I and D and we discussed this today. Call if increased pain/swelling, redness or fever.  Follow up in 1 week.

## 2013-11-08 ENCOUNTER — Ambulatory Visit (INDEPENDENT_AMBULATORY_CARE_PROVIDER_SITE_OTHER): Payer: BC Managed Care – PPO | Admitting: Physician Assistant

## 2013-11-08 VITALS — BP 132/78 | HR 88 | Temp 98.3°F | Resp 16 | Ht 66.0 in | Wt 165.8 lb

## 2013-11-08 DIAGNOSIS — N764 Abscess of vulva: Secondary | ICD-10-CM

## 2013-11-08 NOTE — Patient Instructions (Addendum)
Please continue doxycycline as directed until all tablets are gone.  Continue warm compresses.  The area is more firm, consistent with cellulitis.  I do not feel fluctuation now that would make me feel confident in draining the area.  If symptoms do not continue to improve over the next 24-48 hours, please call and we will set you up with general surgeon for evaluation.

## 2013-11-08 NOTE — Progress Notes (Signed)
Pre visit review using our clinic review tool, if applicable. No additional management support is needed unless otherwise documented below in the visit note/SLS  

## 2013-11-10 ENCOUNTER — Other Ambulatory Visit: Payer: Self-pay

## 2013-11-10 MED ORDER — PANTOPRAZOLE SODIUM 40 MG PO TBEC: 40.0000 mg | DELAYED_RELEASE_TABLET | Freq: Every day | ORAL | Status: DC

## 2013-11-10 NOTE — Telephone Encounter (Signed)
rx sent

## 2013-11-13 ENCOUNTER — Encounter: Payer: Self-pay | Admitting: Physician Assistant

## 2013-11-13 NOTE — Assessment & Plan Note (Addendum)
Having personally seen abscess at last visit with NP Inda Castle, the area has improved and is more consistent with cellulitis as there is induration and no noted fluctuance.  The area of erythema has improved. I/D unlikely to yield drainage.  Recommend completion of entire course of antibiotic.  Avoid applying pressure to area.  Continue warm compresses.  Tylenol or Ibuprofen for pain as patient refuses Rx pain medication.  Return precautions given to patient.

## 2013-11-13 NOTE — Progress Notes (Signed)
Patient presents to clinic today for drainage of vulvar abscess.  Patient was seen previous Friday by NP Inda Castle who diagnosed vulvar abscess and began treatment with Doxycycline.  Patient instructed to return if symptoms worsened.  Patient denies fever, chills.  Endorses some pain but has improved from last visit.  Denies worsening of abscess.  Does note the skin has gotten harder.  Is requesting drainage of abscess.  Past Medical History  Diagnosis Date  . Hypothyroidism   . Diabetes mellitus type II   . Fatty liver     abnormal transaminases; negative work up  . Atypical chest pain     normal coronaries and LV function by cath 10/2011  . GERD (gastroesophageal reflux disease)   . Osteoarthritis     chronic, right knee  . Plantar fasciitis   . Chronic pain disorder     neck and shoulder  . HTN (hypertension)   . HLD (hyperlipidemia)   . Asthma   . Hip pain, bilateral 07/19/2012  . Other malaise and fatigue 01/27/2013  . CTS (carpal tunnel syndrome) 01/27/2013    right  . Hypokalemia 05/25/2013    Improved stopping HCTZ. Was noted to have an elevated glucose when K was low. Recheck renal next week after starting Maxzide  . Esophageal reflux 08/25/2013    [facility], Dr Barth Kirks    Current Outpatient Prescriptions on File Prior to Visit  Medication Sig Dispense Refill  . albuterol (PROVENTIL,VENTOLIN) 90 MCG/ACT inhaler Inhale 2 puffs into the lungs every 6 (six) hours as needed. For shortness of breath/wheezing      . Alcohol Swabs (ALCOHOL PADS) 70 % PADS       . aspirin 81 MG tablet Take 81 mg by mouth daily.        Marland Kitchen diltiazem (CARDIZEM SR) 60 MG 12 hr capsule Take 1 capsule (60 mg total) by mouth daily.      Marland Kitchen doxycycline (VIBRA-TABS) 100 MG tablet Take 1 tablet (100 mg total) by mouth 2 (two) times daily.  20 tablet  0  . Fluticasone-Salmeterol (ADVAIR DISKUS) 250-50 MCG/DOSE AEPB INHALE 1 PUFF INTO LUNGS 2 TIMES DAILY  3 each  3  . FORA V12 BLOOD GLUCOSE TEST test  strip       . glimepiride (AMARYL) 4 MG tablet Take 1 tablet (4 mg total) by mouth daily.  90 tablet  3  . Glucosamine-Chondroitin (OSTEO BI-FLEX REGULAR STRENGTH) 250-200 MG TABS Take 1 tablet by mouth 2 (two) times daily.       Marland Kitchen JANUMET 50-500 MG per tablet TAKE 1 TABLET TWICE A DAY  WITH MEALS  180 tablet  1  . Lite Touch Lancets MISC       . metoCLOPramide (REGLAN) 10 MG tablet Take 1 tablet (10 mg total) by mouth 2 (two) times daily.      . Multiple Vitamin (MULTIVITAMIN) tablet Take 1 tablet by mouth daily.      . niacin (NIASPAN) 500 MG CR tablet 1 tab po qhs with lowfat snack and Aspirin 1/2 hour prior  Failed all statins and welchol  30 tablet  5  . SYNTHROID 88 MCG tablet TAKE 1 TABLET DAILY  90 tablet  1  . triamterene-hydrochlorothiazide (MAXZIDE-25) 37.5-25 MG per tablet Take 1 tablet by mouth daily.  90 tablet  1   No current facility-administered medications on file prior to visit.    Allergies  Allergen Reactions  . Epinephrine Other (See Comments)    Pass out   .  Simvastatin     REACTION: back ache  . Statins     myalgias  . Welchol [Colesevelam Hcl]     myalgia    Family History  Problem Relation Age of Onset  . Alzheimer's disease Mother   . Diabetes type II Mother   . Hiatal hernia Mother   . Diabetes Mother   . Emphysema Father   . Cancer Father     leukemia  . Colon cancer Neg Hx   . Depression Sister     suicide  . Diabetes Sister     History   Social History  . Marital Status: Married    Spouse Name: N/A    Number of Children: 1  . 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 Smoking status: Never Smoker   . Smokeless tobacco: Never Used  . Alcohol Use: No  . Drug Use: No  . Sexual Activity: No   Other Topics Concern  . Not on file   Social History Narrative   Multiple family members with intolerance to statins.    Review of Systems - See HPI.  All other ROS are negative.  BP  132/78  Pulse 88  Temp(Src) 98.3 F (36.8 C) (Oral)  Resp 16  Ht 5\' 6"  (1.676 m)  Wt 165 lb 12 oz (75.184 kg)  BMI 26.77 kg/m2  SpO2 98%  Physical Exam  Vitals reviewed. Constitutional: She is oriented to person, place, and time and well-developed, well-nourished, and in no distress.  HENT:  Head: Normocephalic and atraumatic.  Eyes: Conjunctivae are normal. Pupils are equal, round, and reactive to light.  Cardiovascular: Normal rate, regular rhythm and normal heart sounds.   Pulmonary/Chest: Effort normal and breath sounds normal.  Genitourinary:  Presence of 2 cm region of erythema and induration without noted fluctuance or drainage, located on the lower right labia majora at site of prior abscess, that consistent with cellulitis.  Neurological: She is alert and oriented to person, place, and time.  Skin: Skin is warm and dry.  Psychiatric: Affect normal.    Recent Results (from the past 2160 hour(s))  LIPID PANEL     Status: Abnormal   Collection Time    08/19/13  7:35 AM      Result Value Ref Range   Cholesterol 236 (*) 0 - 200 mg/dL   Comment: ATP III Classification       Desirable:  < 200 mg/dL               Borderline High:  200 - 239 mg/dL          High:  > = 240 mg/dL   Triglycerides 242.0 (*) 0.0 - 149.0 mg/dL   Comment: Normal:  <150 mg/dLBorderline High:  150 - 199 mg/dL   HDL 51.60  >39.00 mg/dL   VLDL 48.4 (*) 0.0 - 40.0 mg/dL   LDL Cholesterol 136 (*) 0 - 99 mg/dL   Total CHOL/HDL Ratio 5     Comment:                Men          Women1/2 Average Risk     3.4          3.3Average Risk          5.0          4.42X Average Risk  9.6          7.13X Average Risk          15.0          11.0                      RENAL FUNCTION PANEL     Status: Abnormal   Collection Time    08/19/13  7:35 AM      Result Value Ref Range   Sodium 139  135 - 145 mEq/L   Potassium 3.9  3.5 - 5.1 mEq/L   Chloride 99  96 - 112 mEq/L   CO2 32  19 - 32 mEq/L   Calcium 9.5  8.4 -  10.5 mg/dL   Albumin 4.4  3.5 - 5.2 g/dL   BUN 15  6 - 23 mg/dL   Creatinine, Ser 1.1  0.4 - 1.2 mg/dL   Glucose, Bld 157 (*) 70 - 99 mg/dL   Phosphorus 3.1  2.3 - 4.6 mg/dL   GFR 51.87 (*) >60.00 mL/min  CBC     Status: None   Collection Time    08/19/13  7:35 AM      Result Value Ref Range   WBC 7.2  4.5 - 10.5 K/uL   RBC 4.38  3.87 - 5.11 Mil/uL   Platelets 222.0  150.0 - 400.0 K/uL   Hemoglobin 13.0  12.0 - 15.0 g/dL   HCT 38.6  36.0 - 46.0 %   MCV 88.1  78.0 - 100.0 fl   MCHC 33.8  30.0 - 36.0 g/dL   RDW 13.9  11.5 - 14.6 %  TSH     Status: None   Collection Time    08/19/13  7:35 AM      Result Value Ref Range   TSH 0.93  0.35 - 5.50 uIU/mL  HEMOGLOBIN A1C     Status: None   Collection Time    08/19/13  7:35 AM      Result Value Ref Range   Hemoglobin A1C 6.4  4.6 - 6.5 %   Comment: Glycemic Control Guidelines for People with Diabetes:Non Diabetic:  <6%Goal of Therapy: <7%Additional Action Suggested:  >8%   HEPATIC FUNCTION PANEL     Status: Abnormal   Collection Time    08/19/13  7:35 AM      Result Value Ref Range   Total Bilirubin 0.8  0.3 - 1.2 mg/dL   Bilirubin, Direct 0.1  0.0 - 0.3 mg/dL   Alkaline Phosphatase 57  39 - 117 U/L   AST 52 (*) 0 - 37 U/L   ALT 66 (*) 0 - 35 U/L   Total Protein 7.4  6.0 - 8.3 g/dL   Albumin 4.4  3.5 - 5.2 g/dL    Assessment/Plan: Vulvar abscess Having personally seen abscess at last visit with NP Inda Castle, the area has improved and is more consistent with cellulitis as there is induration and no noted fluctuance.  The area of erythema has improved. I/D unlikely to yield drainage.  Recommend completion of entire course of antibiotic.  Avoid applying pressure to area.  Continue warm compresses.  Tylenol or Ibuprofen for pain as patient refuses Rx pain medication.  Return precautions given to patient.

## 2013-11-22 ENCOUNTER — Encounter: Payer: Self-pay | Admitting: Family Medicine

## 2013-11-22 ENCOUNTER — Other Ambulatory Visit: Payer: Self-pay | Admitting: Family Medicine

## 2013-11-22 DIAGNOSIS — N760 Acute vaginitis: Secondary | ICD-10-CM

## 2013-11-22 MED ORDER — FLUCONAZOLE 150 MG PO TABS: 150.0000 mg | ORAL_TABLET | ORAL | Status: DC

## 2013-11-24 LAB — HM DIABETES EYE EXAM

## 2013-12-08 ENCOUNTER — Telehealth: Payer: Self-pay

## 2013-12-08 NOTE — Telephone Encounter (Signed)
Diabetic bundle  Patient is supposed to come in on/around 12-24-13 for lipid and other CPE labs

## 2013-12-12 ENCOUNTER — Other Ambulatory Visit: Payer: Self-pay | Admitting: Family Medicine

## 2013-12-21 ENCOUNTER — Other Ambulatory Visit (INDEPENDENT_AMBULATORY_CARE_PROVIDER_SITE_OTHER): Payer: BC Managed Care – PPO

## 2013-12-21 DIAGNOSIS — I10 Essential (primary) hypertension: Secondary | ICD-10-CM

## 2013-12-21 LAB — RENAL FUNCTION PANEL
ALBUMIN: 4.2 g/dL (ref 3.5–5.2)
BUN: 12 mg/dL (ref 6–23)
CO2: 31 meq/L (ref 19–32)
Calcium: 8.9 mg/dL (ref 8.4–10.5)
Chloride: 97 mEq/L (ref 96–112)
Creatinine, Ser: 1.1 mg/dL (ref 0.4–1.2)
GFR: 55.74 mL/min — ABNORMAL LOW (ref >60.00)
Glucose, Bld: 172 mg/dL — ABNORMAL HIGH (ref 70–99)
PHOSPHORUS: 3 mg/dL (ref 2.3–4.6)
Potassium: 3.5 mEq/L (ref 3.5–5.1)
Sodium: 139 mEq/L (ref 135–145)

## 2013-12-28 ENCOUNTER — Ambulatory Visit: Payer: BC Managed Care – PPO | Admitting: Family Medicine

## 2013-12-28 ENCOUNTER — Encounter: Payer: Self-pay | Admitting: Family Medicine

## 2013-12-28 ENCOUNTER — Ambulatory Visit (INDEPENDENT_AMBULATORY_CARE_PROVIDER_SITE_OTHER): Payer: BC Managed Care – PPO | Admitting: Family Medicine

## 2013-12-28 VITALS — BP 132/80 | HR 93 | Temp 98.2°F | Ht 66.0 in | Wt 166.0 lb

## 2013-12-28 DIAGNOSIS — IMO0001 Reserved for inherently not codable concepts without codable children: Secondary | ICD-10-CM

## 2013-12-28 DIAGNOSIS — R300 Dysuria: Secondary | ICD-10-CM

## 2013-12-28 DIAGNOSIS — E039 Hypothyroidism, unspecified: Secondary | ICD-10-CM

## 2013-12-28 DIAGNOSIS — E119 Type 2 diabetes mellitus without complications: Secondary | ICD-10-CM

## 2013-12-28 DIAGNOSIS — I10 Essential (primary) hypertension: Secondary | ICD-10-CM

## 2013-12-28 DIAGNOSIS — K219 Gastro-esophageal reflux disease without esophagitis: Secondary | ICD-10-CM

## 2013-12-28 DIAGNOSIS — E785 Hyperlipidemia, unspecified: Secondary | ICD-10-CM

## 2013-12-28 DIAGNOSIS — E1165 Type 2 diabetes mellitus with hyperglycemia: Secondary | ICD-10-CM

## 2013-12-28 NOTE — Progress Notes (Signed)
Pre visit review using our clinic review tool, if applicable. No additional management support is needed unless otherwise documented below in the visit note. 

## 2013-12-28 NOTE — Patient Instructions (Signed)
Shoes off at next visit Basic Carbohydrate Counting for Diabetes Mellitus Carbohydrate counting is a method for keeping track of the amount of carbohydrates you eat. Eating carbohydrates naturally increases the level of sugar (glucose) in your blood, so it is important for you to know the amount that is okay for you to have in every meal. Carbohydrate counting helps keep the level of glucose in your blood within normal limits. The amount of carbohydrates allowed is different for every person. A dietitian can help you calculate the amount that is right for you. Once you know the amount of carbohydrates you can have, you can count the carbohydrates in the foods you want to eat. Carbohydrates are found in the following foods:  Grains, such as breads and cereals.  Dried beans and soy products.  Starchy vegetables, such as potatoes, peas, and corn.  Fruit and fruit juices.  Milk and yogurt.  Sweets and snack foods, such as cake, cookies, candy, chips, soft drinks, and fruit drinks. CARBOHYDRATE COUNTING There are two ways to count the carbohydrates in your food. You can use either of the methods or a combination of both. Reading the "Nutrition Facts" on [place] The "Nutrition Facts" is an area that is included on the labels of almost all packaged food and beverages in the [place]. It includes the serving size of that food or beverage and information about the nutrients in each serving of the food, including the grams (g) of carbohydrate per serving.  Decide the number of servings of this food or beverage that you will be able to eat or drink. Multiply that number of servings by the number of grams of carbohydrate that is listed on the label for that serving. The total will be the amount of carbohydrates you will be having when you eat or drink this food or beverage. Learning Standard Serving Sizes of Food When you eat food that is not packaged or does not include "Nutrition Facts" on the  label, you need to measure the servings in order to count the amount of carbohydrates.A serving of most carbohydrate-rich foods contains about 15 g of carbohydrates. The following list includes serving sizes of carbohydrate-rich foods that provide 15 g ofcarbohydrate per serving:   1 slice of bread (1 oz) or 1 six-inch tortilla.    of a hamburger bun or English muffin.  4-6 crackers.   cup unsweetened dry cereal.    cup hot cereal.   cup rice or pasta.    cup mashed potatoes or  of a large baked potato.  1 cup fresh fruit or one small piece of fruit.    cup canned or frozen fruit or fruit juice.  1 cup milk.   cup plain fat-free yogurt or yogurt sweetened with artificial sweeteners.   cup cooked dried beans or starchy vegetable, such as peas, corn, or potatoes.  Decide the number of standard-size servings that you will eat. Multiply that number of servings by 15 (the grams of carbohydrates in that serving). For example, if you eat 2 cups of strawberries, you will have eaten 2 servings and 30 g of carbohydrates (2 servings x 15 g = 30 g). For foods such as soups and casseroles, in which more than one food is mixed in, you will need to count the carbohydrates in each food that is included. EXAMPLE OF CARBOHYDRATE COUNTING Sample Dinner  3 oz chicken breast.   cup of brown rice.   cup of corn.  1 cup milk.  1 cup strawberries with sugar-free whipped topping.  Carbohydrate Calculation Step 1: Identify the foods that contain carbohydrates:   Rice.   Corn.   Milk.   Strawberries. Step 2:Calculate the number of servings eaten of each:   2 servings of rice.   1 serving of corn.   1 serving of milk.   1 serving of strawberries. Step 3: Multiply each of those number of servings by 15 g:   2 servings of rice x 15 g = 30 g.   1 serving of corn x 15 g = 15 g.   1 serving of milk x 15 g = 15 g.   1 serving of strawberries x 15 g = 15  g. Step 4: Add together all of the amounts to find the total grams of carbohydrates eaten: 30 g + 15 g + 15 g + 15 g = 75 g. Document Released: 05/13/2005 Document Revised: 09/27/2013 Document Reviewed: 04/09/2013 [facility] Patient Information 2015 [place], [place]. This information is not intended to replace advice given to you by your health care provider. Make sure you discuss any questions you have with your health care provider.

## 2013-12-28 NOTE — Assessment & Plan Note (Signed)
Well controlled, no changes to meds. Encouraged heart healthy diet such as the DASH diet and exercise as tolerated.  °

## 2013-12-28 NOTE — Assessment & Plan Note (Signed)
Avoid offending foods, start probiotics. Do not eat large meals in late evening and consider raising head of bed.  

## 2013-12-29 ENCOUNTER — Other Ambulatory Visit (INDEPENDENT_AMBULATORY_CARE_PROVIDER_SITE_OTHER): Payer: BC Managed Care – PPO

## 2013-12-29 ENCOUNTER — Other Ambulatory Visit: Payer: Self-pay | Admitting: Family Medicine

## 2013-12-29 DIAGNOSIS — K219 Gastro-esophageal reflux disease without esophagitis: Secondary | ICD-10-CM

## 2013-12-29 DIAGNOSIS — R300 Dysuria: Secondary | ICD-10-CM

## 2013-12-29 DIAGNOSIS — I10 Essential (primary) hypertension: Secondary | ICD-10-CM

## 2013-12-29 DIAGNOSIS — IMO0001 Reserved for inherently not codable concepts without codable children: Secondary | ICD-10-CM

## 2013-12-29 DIAGNOSIS — E785 Hyperlipidemia, unspecified: Secondary | ICD-10-CM

## 2013-12-29 DIAGNOSIS — E1165 Type 2 diabetes mellitus with hyperglycemia: Secondary | ICD-10-CM

## 2013-12-29 LAB — URINALYSIS, ROUTINE W REFLEX MICROSCOPIC
BILIRUBIN URINE: NEGATIVE
Hgb urine dipstick: NEGATIVE
KETONES UR: NEGATIVE
Nitrite: NEGATIVE
RBC / HPF: NONE SEEN (ref 0–?)
Total Protein, Urine: NEGATIVE
UROBILINOGEN UA: 0.2 (ref 0.0–1.0)
Urine Glucose: NEGATIVE
pH: 6.5 (ref 5.0–8.0)

## 2013-12-29 LAB — LIPID PANEL
Cholesterol: 213 mg/dL — ABNORMAL HIGH (ref 0–200)
HDL: 43.7 mg/dL (ref >39.00)
NonHDL: 169.3
TRIGLYCERIDES: 246 mg/dL — AB (ref 0.0–149.0)
Total CHOL/HDL Ratio: 5
VLDL: 49.2 mg/dL — AB (ref 0.0–40.0)

## 2013-12-29 LAB — CBC
HEMATOCRIT: 37.7 % (ref 36.0–46.0)
HEMOGLOBIN: 12.7 g/dL (ref 12.0–15.0)
MCHC: 33.7 g/dL (ref 30.0–36.0)
MCV: 88.9 fl (ref 78.0–100.0)
Platelets: 213 10*3/uL (ref 150.0–400.0)
RBC: 4.24 Mil/uL (ref 3.87–5.11)
RDW: 13.8 % (ref 11.5–15.5)
WBC: 6.7 10*3/uL (ref 4.0–10.5)

## 2013-12-29 LAB — HEPATIC FUNCTION PANEL
ALK PHOS: 54 U/L (ref 39–117)
ALT: 45 U/L — ABNORMAL HIGH (ref 0–35)
AST: 42 U/L — ABNORMAL HIGH (ref 0–37)
Albumin: 4 g/dL (ref 3.5–5.2)
Bilirubin, Direct: 0.1 mg/dL (ref 0.0–0.3)
TOTAL PROTEIN: 7.3 g/dL (ref 6.0–8.3)
Total Bilirubin: 0.7 mg/dL (ref 0.2–1.2)

## 2013-12-29 LAB — RENAL FUNCTION PANEL
Albumin: 4 g/dL (ref 3.5–5.2)
BUN: 12 mg/dL (ref 6–23)
CO2: 29 meq/L (ref 19–32)
CREATININE: 1 mg/dL (ref 0.4–1.2)
Calcium: 8.7 mg/dL (ref 8.4–10.5)
Chloride: 99 mEq/L (ref 96–112)
GFR: 61.69 mL/min (ref >60.00)
Glucose, Bld: 151 mg/dL — ABNORMAL HIGH (ref 70–99)
Phosphorus: 3.5 mg/dL (ref 2.3–4.6)
Potassium: 3.3 mEq/L — ABNORMAL LOW (ref 3.5–5.1)
Sodium: 140 mEq/L (ref 135–145)

## 2013-12-29 LAB — LDL CHOLESTEROL, DIRECT: LDL DIRECT: 146.6 mg/dL

## 2013-12-29 LAB — HEMOGLOBIN A1C: Hgb A1c MFr Bld: 6.7 % — ABNORMAL HIGH (ref 4.6–6.5)

## 2013-12-29 LAB — TSH: TSH: 1.7 u[IU]/mL (ref 0.35–4.50)

## 2013-12-30 LAB — URINE CULTURE: Colony Count: 30000

## 2013-12-31 ENCOUNTER — Telehealth: Payer: Self-pay

## 2013-12-31 DIAGNOSIS — R300 Dysuria: Secondary | ICD-10-CM

## 2013-12-31 NOTE — Telephone Encounter (Signed)
Lab order placed per lab results

## 2014-01-02 ENCOUNTER — Other Ambulatory Visit: Payer: Self-pay | Admitting: Family Medicine

## 2014-01-02 ENCOUNTER — Encounter: Payer: Self-pay | Admitting: Family Medicine

## 2014-01-02 NOTE — Assessment & Plan Note (Addendum)
Labile sugars, struggling with fatigue, blurry vision and intermittent paresthesias. Encouraged small, frequent meals always with lean proteins. hgba1c acceptable, minimize simple carbs. Increase exercise as tolerated. Continue current meds. Encouraged to see opthamology soon if vision does not improve.

## 2014-01-02 NOTE — Assessment & Plan Note (Signed)
Avoid offending foods, start probiotics. Do not eat large meals in late evening and consider raising head of bed.  

## 2014-01-02 NOTE — Assessment & Plan Note (Signed)
Encouraged heart healthy diet, increase exercise, avoid trans fats, consider a krill oil cap daily 

## 2014-01-02 NOTE — Assessment & Plan Note (Signed)
On Levothyroxine, continue to monitor 

## 2014-01-02 NOTE — Progress Notes (Signed)
Patient ID: [name] [name], female   DOB: 1955-10-04, 58 y.o.   MRN: [id number] [name] [name] [id number] 1955-10-09 01/02/2014      Progress Note-Follow Up  Subjective  Chief Complaint  Chief Complaint  Patient presents with  . Follow-up    4 month    HPI  Patient is a 58 year old female in today for routine medical care. Patient describes labile sugars, has episodes of hi sugars and then dropping sugars feeling week and fatigued. Does skip meals at times. Notes polyuria, polydipsia.  Has some occasional blurry vision. Denies CP/palp/SOB/HA/congestion/fevers/GI or GU c/o. Taking meds as prescribed. Sees opthamology annually  Past Medical History  Diagnosis Date  . Hypothyroidism   . Diabetes mellitus type II   . Fatty liver     abnormal transaminases; negative work up  . Atypical chest pain     normal coronaries and LV function by cath 10/2011  . GERD (gastroesophageal reflux disease)   . Osteoarthritis     chronic, right knee  . Plantar fasciitis   . Chronic pain disorder     neck and shoulder  . HTN (hypertension)   . HLD (hyperlipidemia)   . Asthma   . Hip pain, bilateral 07/19/2012  . Other malaise and fatigue 01/27/2013  . CTS (carpal tunnel syndrome) 01/27/2013    right  . Hypokalemia 05/25/2013    Improved stopping HCTZ. Was noted to have an elevated glucose when K was low. Recheck renal next week after starting Maxzide  . Esophageal reflux 08/25/2013    [facility], Dr Barth Kirks    Past Surgical History  Procedure Laterality Date  . Cholecystectomy    . Tonsillectomy  1975  . Knee arthroscopy  1998, 2007  . Vesicovaginal fistula closure w/ tah  1995  . Abdominal hysterectomy  1982    Family History  Problem Relation Age of Onset  . Alzheimer's disease Mother   . Diabetes type II Mother   . Hiatal hernia Mother   . Diabetes Mother   . Emphysema Father   . Cancer Father     leukemia  . Colon cancer Neg Hx   . Depression Sister     suicide  .  Diabetes Sister     History   Social History  . Marital Status: Married    Spouse Name: N/A    Number of Children: 1  . 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 Smoking status: Never Smoker   . Smokeless tobacco: Never Used  . Alcohol Use: No  . Drug Use: No  . Sexual Activity: No   Other Topics Concern  . Not on file   Social History Narrative   Multiple family members with intolerance to statins.    Current Outpatient Prescriptions on File Prior to Visit  Medication Sig Dispense Refill  . albuterol (PROVENTIL,VENTOLIN) 90 MCG/ACT inhaler Inhale 2 puffs into the lungs every 6 (six) hours as needed. For shortness of breath/wheezing      . Alcohol Swabs (ALCOHOL PADS) 70 % PADS       . aspirin 81 MG tablet Take 81 mg by mouth daily.        Marland Kitchen diltiazem (CARDIZEM SR) 60 MG 12 hr capsule Take 1 capsule (60 mg total) by mouth daily.      . Fluticasone-Salmeterol (ADVAIR DISKUS) 250-50 MCG/DOSE AEPB INHALE 1 PUFF INTO LUNGS 2 TIMES DAILY  3  each  3  . FORA V12 BLOOD GLUCOSE TEST test strip       . glimepiride (AMARYL) 4 MG tablet Take 1 tablet (4 mg total) by mouth daily.  90 tablet  3  . Glucosamine-Chondroitin (OSTEO BI-FLEX REGULAR STRENGTH) 250-200 MG TABS Take 1 tablet by mouth 2 (two) times daily.       Marland Kitchen JANUMET 50-500 MG per tablet TAKE 1 TABLET TWICE A DAY  WITH MEALS  180 tablet  1  . Lite Touch Lancets MISC       . metoCLOPramide (REGLAN) 10 MG tablet Take 1 tablet (10 mg total) by mouth 2 (two) times daily.      . Multiple Vitamin (MULTIVITAMIN) tablet Take 1 tablet by mouth daily.      . niacin (NIASPAN) 500 MG CR tablet 1 tab po qhs with lowfat snack and Aspirin 1/2 hour prior  Failed all statins and welchol  30 tablet  5  . pantoprazole (PROTONIX) 40 MG tablet Take 1 tablet (40 mg total) by mouth daily.  90 tablet  1  . SYNTHROID 88 MCG tablet TAKE 1 TABLET DAILY  90 tablet  1  . triamterene-hydrochlorothiazide  (MAXZIDE-25) 37.5-25 MG per tablet Take 1 tablet by mouth daily.  90 tablet  1   No current facility-administered medications on file prior to visit.    Allergies  Allergen Reactions  . Epinephrine Other (See Comments)    Pass out   . Simvastatin     REACTION: back ache  . Statins     myalgias  . Welchol [Colesevelam Hcl]     myalgia    Review of Systems  Review of Systems  Constitutional: Positive for malaise/fatigue. Negative for fever.  HENT: Negative for congestion.   Eyes: Positive for blurred vision. Negative for discharge.  Respiratory: Negative for shortness of breath.   Cardiovascular: Negative for chest pain, palpitations and leg swelling.  Gastrointestinal: Negative for nausea, abdominal pain and diarrhea.  Genitourinary: Negative for dysuria.  Musculoskeletal: Negative for falls.  Skin: Negative for rash.  Neurological: Positive for sensory change. Negative for loss of consciousness and headaches.       Describes intermittent paresthesias throughout.  Endo/Heme/Allergies: Negative for polydipsia.  Psychiatric/Behavioral: Negative for depression and suicidal ideas. The patient is not nervous/anxious and does not have insomnia.     Objective  BP 132/80  Pulse 93  Temp(Src) 98.2 F (36.8 C) (Oral)  Ht 5\' 6"  (1.676 m)  Wt 166 lb 0.6 oz (75.315 kg)  BMI 26.81 kg/m2  SpO2 98%  Physical Exam  Physical Exam  Constitutional: She is oriented to person, place, and time and well-developed, well-nourished, and in no distress. No distress.  HENT:  Head: Normocephalic and atraumatic.  Eyes: Conjunctivae are normal.  Neck: Neck supple. No thyromegaly present.  Cardiovascular: Normal rate, regular rhythm and normal heart sounds.   No murmur heard. Pulmonary/Chest: Effort normal and breath sounds normal. She has no wheezes.  Abdominal: She exhibits no distension and no mass.  Musculoskeletal: She exhibits no edema.  Lymphadenopathy:    She has no cervical  adenopathy.  Neurological: She is alert and oriented to person, place, and time.  Skin: Skin is warm and dry. No rash noted. She is not diaphoretic.  Psychiatric: Memory, affect and judgment normal.    Lab Results  Component Value Date   TSH 1.70 12/29/2013   Lab Results  Component Value Date   WBC 6.7 12/29/2013   HGB 12.7 12/29/2013  HCT 37.7 12/29/2013   MCV 88.9 12/29/2013   PLT 213.0 12/29/2013   Lab Results  Component Value Date   CREATININE 1.0 12/29/2013   BUN 12 12/29/2013   NA 140 12/29/2013   K 3.3* 12/29/2013   CL 99 12/29/2013   CO2 29 12/29/2013   Lab Results  Component Value Date   ALT 45* 12/29/2013   AST 42* 12/29/2013   ALKPHOS 54 12/29/2013   BILITOT 0.7 12/29/2013   Lab Results  Component Value Date   CHOL 213* 12/29/2013   Lab Results  Component Value Date   HDL 43.70 12/29/2013   Lab Results  Component Value Date   LDLCALC 136* 08/19/2013   Lab Results  Component Value Date   TRIG 246.0* 12/29/2013   Lab Results  Component Value Date   CHOLHDL 5 12/29/2013     Assessment & Plan  Hypertension Well controlled, no changes to meds. Encouraged heart healthy diet such as the DASH diet and exercise as tolerated.   Esophageal reflux Avoid offending foods, start probiotics. Do not eat large meals in late evening and consider raising head of bed.   GERD (gastroesophageal reflux disease) Avoid offending foods, start probiotics. Do not eat large meals in late evening and consider raising head of bed.   DIABETES MELLITUS, TYPE II Labile sugars, struggling with fatigue, blurry vision and intermittent paresthesias. Encouraged small, frequent meals always with lean proteins. hgba1c acceptable, minimize simple carbs. Increase exercise as tolerated. Continue current meds. Encouraged to see opthamology soon if vision does not improve.  HYPOTHYROIDISM On Levothyroxine, continue to monitor  DYSLIPIDEMIA Encouraged heart healthy diet, increase exercise, avoid trans fats, consider a  krill oil cap daily

## 2014-02-25 ENCOUNTER — Other Ambulatory Visit: Payer: Self-pay | Admitting: Family Medicine

## 2014-04-19 ENCOUNTER — Other Ambulatory Visit: Payer: Self-pay | Admitting: Family Medicine

## 2014-04-27 ENCOUNTER — Telehealth: Payer: Self-pay | Admitting: Family Medicine

## 2014-04-27 ENCOUNTER — Other Ambulatory Visit: Payer: Self-pay | Admitting: Family Medicine

## 2014-04-27 DIAGNOSIS — E119 Type 2 diabetes mellitus without complications: Secondary | ICD-10-CM

## 2014-04-27 DIAGNOSIS — I10 Essential (primary) hypertension: Secondary | ICD-10-CM

## 2014-04-27 DIAGNOSIS — E785 Hyperlipidemia, unspecified: Secondary | ICD-10-CM

## 2014-04-27 NOTE — Telephone Encounter (Signed)
Labs ordered please notify patient

## 2014-04-27 NOTE — Telephone Encounter (Signed)
Caller name: aalyssa, elderkin Relation to LD:JTTS Call back [place]:  Reason for call: pt states she needs an order in for her labs, states its for her 4 month fu, she has an appt on 05/02/14 at 7:45 with dr. Charlett Blake, states she has her labs done at the [facility] and generally the orders are always put in however when she went to have the labs done the order was not there.

## 2014-04-28 ENCOUNTER — Encounter: Payer: Self-pay | Admitting: Family Medicine

## 2014-04-28 NOTE — Telephone Encounter (Signed)
Pt is aware orders are in

## 2014-04-29 ENCOUNTER — Other Ambulatory Visit (INDEPENDENT_AMBULATORY_CARE_PROVIDER_SITE_OTHER): Payer: BC Managed Care – PPO

## 2014-04-29 DIAGNOSIS — E785 Hyperlipidemia, unspecified: Secondary | ICD-10-CM

## 2014-04-29 DIAGNOSIS — I10 Essential (primary) hypertension: Secondary | ICD-10-CM

## 2014-04-29 DIAGNOSIS — E876 Hypokalemia: Secondary | ICD-10-CM

## 2014-04-29 DIAGNOSIS — E119 Type 2 diabetes mellitus without complications: Secondary | ICD-10-CM

## 2014-04-30 LAB — CBC
HEMATOCRIT: 40.5 % (ref 36.0–46.0)
HEMOGLOBIN: 13.3 g/dL (ref 12.0–15.0)
MCHC: 32.8 g/dL (ref 30.0–36.0)
MCV: 88 fl (ref 78.0–100.0)
PLATELETS: 205 10*3/uL (ref 150.0–400.0)
RBC: 4.6 Mil/uL (ref 3.87–5.11)
RDW: 13.3 % (ref 11.5–15.5)
WBC: 6.9 10*3/uL (ref 4.0–10.5)

## 2014-04-30 LAB — HEMOGLOBIN A1C: Hgb A1c MFr Bld: 7.5 % — ABNORMAL HIGH (ref 4.6–6.5)

## 2014-05-01 LAB — LIPID PANEL
CHOL/HDL RATIO: 5
Cholesterol: 222 mg/dL — ABNORMAL HIGH (ref 0–200)
HDL: 42.6 mg/dL (ref >39.00)
NonHDL: 179.4
Triglycerides: 237 mg/dL — ABNORMAL HIGH (ref 0.0–149.0)
VLDL: 47.4 mg/dL — AB (ref 0.0–40.0)

## 2014-05-01 LAB — HEPATIC FUNCTION PANEL
ALT: 78 U/L — AB (ref 0–35)
AST: 71 U/L — ABNORMAL HIGH (ref 0–37)
Albumin: 4.3 g/dL (ref 3.5–5.2)
Alkaline Phosphatase: 64 U/L (ref 39–117)
BILIRUBIN TOTAL: 0.7 mg/dL (ref 0.2–1.2)
Bilirubin, Direct: 0 mg/dL (ref 0.0–0.3)
TOTAL PROTEIN: 7.7 g/dL (ref 6.0–8.3)

## 2014-05-01 LAB — RENAL FUNCTION PANEL
Albumin: 4.3 g/dL (ref 3.5–5.2)
BUN: 19 mg/dL (ref 6–23)
CALCIUM: 9.7 mg/dL (ref 8.4–10.5)
CHLORIDE: 97 meq/L (ref 96–112)
CO2: 27 meq/L (ref 19–32)
CREATININE: 1.1 mg/dL (ref 0.4–1.2)
GFR: 56.28 mL/min — ABNORMAL LOW (ref >60.00)
Glucose, Bld: 141 mg/dL — ABNORMAL HIGH (ref 70–99)
Phosphorus: 3.4 mg/dL (ref 2.3–4.6)
Potassium: 4.5 mEq/L (ref 3.5–5.1)
Sodium: 136 mEq/L (ref 135–145)

## 2014-05-01 LAB — LDL CHOLESTEROL, DIRECT: Direct LDL: 143.1 mg/dL

## 2014-05-01 LAB — TSH: TSH: 1.78 u[IU]/mL (ref 0.35–4.50)

## 2014-05-02 ENCOUNTER — Ambulatory Visit (INDEPENDENT_AMBULATORY_CARE_PROVIDER_SITE_OTHER): Payer: BC Managed Care – PPO | Admitting: Family Medicine

## 2014-05-02 ENCOUNTER — Encounter: Payer: Self-pay | Admitting: Family Medicine

## 2014-05-02 VITALS — BP 132/69 | HR 97 | Temp 98.0°F | Ht 66.0 in | Wt 168.8 lb

## 2014-05-02 DIAGNOSIS — K219 Gastro-esophageal reflux disease without esophagitis: Secondary | ICD-10-CM

## 2014-05-02 DIAGNOSIS — E669 Obesity, unspecified: Secondary | ICD-10-CM

## 2014-05-02 DIAGNOSIS — E039 Hypothyroidism, unspecified: Secondary | ICD-10-CM

## 2014-05-02 DIAGNOSIS — E1169 Type 2 diabetes mellitus with other specified complication: Secondary | ICD-10-CM

## 2014-05-02 DIAGNOSIS — E119 Type 2 diabetes mellitus without complications: Secondary | ICD-10-CM

## 2014-05-02 DIAGNOSIS — E785 Hyperlipidemia, unspecified: Secondary | ICD-10-CM

## 2014-05-02 DIAGNOSIS — Z23 Encounter for immunization: Secondary | ICD-10-CM

## 2014-05-02 DIAGNOSIS — I10 Essential (primary) hypertension: Secondary | ICD-10-CM

## 2014-05-02 DIAGNOSIS — J45909 Unspecified asthma, uncomplicated: Secondary | ICD-10-CM

## 2014-05-02 MED ORDER — SITAGLIPTIN PHOS-METFORMIN HCL 50-1000 MG PO TABS: 1.0000 | ORAL_TABLET | Freq: Two times a day (BID) | ORAL | Status: DC

## 2014-05-02 MED ORDER — PITAVASTATIN CALCIUM 1 MG PO TABS: ORAL_TABLET | ORAL | Status: DC

## 2014-05-02 NOTE — Patient Instructions (Signed)

## 2014-05-02 NOTE — Assessment & Plan Note (Signed)
Avoid offending foods, start probiotics. Do not eat large meals in late evening and consider raising head of bed.  

## 2014-05-02 NOTE — Assessment & Plan Note (Signed)
hgba1c acceptable last time but patient is noting numbers 214 to 245, minimize simple carbs. Increase exercise as tolerated. Continue current meds for now and check hgba1c.

## 2014-05-02 NOTE — Progress Notes (Signed)
Pre visit review using our clinic review tool, if applicable. No additional management support is needed unless otherwise documented below in the visit note. 

## 2014-05-02 NOTE — Assessment & Plan Note (Signed)
Denies CP/palp/SOB/HA/congestion/fevers/GI or GU c/o. Taking meds as prescribed 

## 2014-05-05 ENCOUNTER — Encounter (HOSPITAL_COMMUNITY): Payer: Self-pay | Admitting: Cardiovascular Disease

## 2014-05-08 ENCOUNTER — Encounter: Payer: Self-pay | Admitting: Family Medicine

## 2014-05-08 NOTE — Assessment & Plan Note (Signed)
On Levothyroxine, continue to monitor 

## 2014-05-08 NOTE — Assessment & Plan Note (Signed)
Well controlled has not needed Albuterol or Advair recently

## 2014-05-08 NOTE — Progress Notes (Signed)
[name] [name]  [id number] [birth year] 05/08/2014      Progress Note-Follow Up  Subjective  Chief Complaint  Chief Complaint  Patient presents with  . Follow-up    4 month  . Injections    flu    HPI  Patient is a 58 y.o. female in today for routine medical care. Patient is in today for follow-up. Notes blood sugars continue to be elevated at 200-250 frequently. Denies polyuria or polydipsia. Notes asthma has been very well controlled for well over a year. GI symptoms are improved with probiotics. No recent illness. Denies CP/palp/SOB/HA/congestion/fevers/GI or GU c/o. Taking meds as prescribed  Past Medical History  Diagnosis Date  . Hypothyroidism   . Diabetes mellitus type II   . Fatty liver     abnormal transaminases; negative work up  . Atypical chest pain     normal coronaries and LV function by cath 10/2011  . GERD (gastroesophageal reflux disease)   . Osteoarthritis     chronic, right knee  . Plantar fasciitis   . Chronic pain disorder     neck and shoulder  . HTN (hypertension)   . HLD (hyperlipidemia)   . Asthma   . Hip pain, bilateral 07/19/2012  . Other malaise and fatigue 01/27/2013  . CTS (carpal tunnel syndrome) 01/27/2013    right  . Hypokalemia 05/25/2013    Improved stopping HCTZ. Was noted to have an elevated glucose when K was low. Recheck renal next week after starting Maxzide  . Esophageal reflux 08/25/2013    [facility], Dr Barth Kirks    Past Surgical History  Procedure Laterality Date  . Cholecystectomy    . Tonsillectomy  1975  . Knee arthroscopy  1998, 2007  . Vesicovaginal fistula closure w/ tah  1995  . Abdominal hysterectomy  1982  . Left heart catheterization with coronary angiogram N/A 11/21/2011    Procedure: LEFT HEART CATHETERIZATION WITH CORONARY ANGIOGRAM;  Surgeon: Josue Hector, MD;  Location: [facility] CATH LAB;  Service: Cardiovascular;  Laterality: N/A;    Family History  Problem Relation Age of Onset  . Alzheimer's  disease Mother   . Diabetes type II Mother   . Hiatal hernia Mother   . Diabetes Mother   . Emphysema Father   . Cancer Father     leukemia  . Colon cancer Neg Hx   . Depression Sister     suicide  . Diabetes Sister     History   Social History  . Marital Status: Married    Spouse Name: N/A    Number of Children: 1  . 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 Smoking status: Never Smoker   . Smokeless tobacco: Never Used  . Alcohol Use: No  . Drug Use: No  . Sexual Activity: No   Other Topics Concern  . Not on file   Social History Narrative   Multiple family members with intolerance to statins.    Current Outpatient Prescriptions on File Prior to Visit  Medication Sig Dispense Refill  . albuterol (PROVENTIL,VENTOLIN) 90 MCG/ACT inhaler Inhale 2 puffs into the lungs every 6 (six) hours as needed. For shortness of breath/wheezing    . Alcohol Swabs (ALCOHOL PADS) 70 % PADS     . aspirin 81 MG tablet Take 81 mg by mouth daily.      Marland Kitchen diltiazem (CARDIZEM SR) 60 MG 12 hr capsule Take  1 capsule (60 mg total) by mouth daily.    . Fluticasone-Salmeterol (ADVAIR DISKUS) 250-50 MCG/DOSE AEPB INHALE 1 PUFF INTO LUNGS 2 TIMES DAILY 3 each 3  . FORA V12 BLOOD GLUCOSE TEST test strip     . glimepiride (AMARYL) 4 MG tablet TAKE 1 TABLET DAILY 90 tablet 3  . Glucosamine-Chondroitin (OSTEO BI-FLEX REGULAR STRENGTH) 250-200 MG TABS Take 1 tablet by mouth 2 (two) times daily.     . Lite Touch Lancets MISC     . metoCLOPramide (REGLAN) 10 MG tablet Take 1 tablet (10 mg total) by mouth 2 (two) times daily.    . Multiple Vitamin (MULTIVITAMIN) tablet Take 1 tablet by mouth daily.    . niacin (NIASPAN) 500 MG CR tablet 1 tab po qhs with lowfat snack and Aspirin 1/2 hour prior  Failed all statins and welchol 30 tablet 5  . pantoprazole (PROTONIX) 40 MG tablet Take 1 tablet (40 mg total) by mouth daily. 90 tablet 1  . SYNTHROID 88 MCG  tablet TAKE 1 TABLET DAILY 90 tablet 1  . triamterene-hydrochlorothiazide (MAXZIDE-25) 37.5-25 MG per tablet TAKE 1 TABLET DAILY 90 tablet 1   No current facility-administered medications on file prior to visit.    Allergies  Allergen Reactions  . Epinephrine Other (See Comments)    Pass out   . Simvastatin     REACTION: back ache  . Statins     myalgias  . Welchol [Colesevelam Hcl]     myalgia    Review of Systems  ROS  Objective  BP 132/69 mmHg  Pulse 97  Temp(Src) 98 F (36.7 C) (Oral)  Ht 5\' 6"  (1.676 m)  Wt 168 lb 12.8 oz (76.567 kg)  BMI 27.26 kg/m2  SpO2 100%  Physical Exam  Physical Exam  Lab Results  Component Value Date   TSH 1.78 04/29/2014   Lab Results  Component Value Date   WBC 6.9 04/29/2014   HGB 13.3 04/29/2014   HCT 40.5 04/29/2014   MCV 88.0 04/29/2014   PLT 205.0 04/29/2014   Lab Results  Component Value Date   CREATININE 1.1 04/29/2014   BUN 19 04/29/2014   NA 136 04/29/2014   K 4.5 04/29/2014   CL 97 04/29/2014   CO2 27 04/29/2014   Lab Results  Component Value Date   ALT 78* 04/29/2014   AST 71* 04/29/2014   ALKPHOS 64 04/29/2014   BILITOT 0.7 04/29/2014   Lab Results  Component Value Date   CHOL 222* 04/29/2014   Lab Results  Component Value Date   HDL 42.60 04/29/2014   Lab Results  Component Value Date   LDLCALC 136* 08/19/2013   Lab Results  Component Value Date   TRIG 237.0* 04/29/2014   Lab Results  Component Value Date   CHOLHDL 5 04/29/2014     Assessment & Plan  Hypertension Denies CP/palp/SOB/HA/congestion/fevers/GI or GU c/o. Taking meds as prescribed  Esophageal reflux Avoid offending foods, start probiotics. Do not eat large meals in late evening and consider raising head of bed.   Diabetes mellitus type 2 in obese hgba1c acceptable last time but patient is noting numbers 214 to 245, minimize simple carbs. Increase exercise as tolerated. Continue current meds for now and check  hgba1c.  Hypothyroidism On Levothyroxine, continue to monitor  Asthma Well controlled has not needed Albuterol or Advair recently

## 2014-05-11 ENCOUNTER — Other Ambulatory Visit: Payer: Self-pay

## 2014-05-11 ENCOUNTER — Other Ambulatory Visit: Payer: Self-pay | Admitting: Family Medicine

## 2014-05-24 ENCOUNTER — Telehealth: Payer: Self-pay | Admitting: Family Medicine

## 2014-05-24 ENCOUNTER — Encounter: Payer: Self-pay | Admitting: Family Medicine

## 2014-05-24 ENCOUNTER — Ambulatory Visit (INDEPENDENT_AMBULATORY_CARE_PROVIDER_SITE_OTHER): Payer: BC Managed Care – PPO | Admitting: Family Medicine

## 2014-05-24 VITALS — BP 131/71 | HR 84 | Temp 97.9°F | Ht 66.0 in | Wt 168.6 lb

## 2014-05-24 DIAGNOSIS — E1169 Type 2 diabetes mellitus with other specified complication: Secondary | ICD-10-CM

## 2014-05-24 DIAGNOSIS — K219 Gastro-esophageal reflux disease without esophagitis: Secondary | ICD-10-CM

## 2014-05-24 DIAGNOSIS — E119 Type 2 diabetes mellitus without complications: Secondary | ICD-10-CM

## 2014-05-24 DIAGNOSIS — J45901 Unspecified asthma with (acute) exacerbation: Secondary | ICD-10-CM

## 2014-05-24 DIAGNOSIS — E669 Obesity, unspecified: Secondary | ICD-10-CM

## 2014-05-24 MED ORDER — DOXYCYCLINE HYCLATE 100 MG PO TABS: 100.0000 mg | ORAL_TABLET | Freq: Two times a day (BID) | ORAL | Status: DC

## 2014-05-24 MED ORDER — METHYLPREDNISOLONE (PAK) 4 MG PO TABS: ORAL_TABLET | ORAL | Status: DC

## 2014-05-24 MED ORDER — ALBUTEROL SULFATE HFA 108 (90 BASE) MCG/ACT IN AERS: 2.0000 | INHALATION_SPRAY | Freq: Four times a day (QID) | RESPIRATORY_TRACT | Status: DC | PRN

## 2014-05-24 MED ORDER — BENZONATATE 100 MG PO CAPS: 100.0000 mg | ORAL_CAPSULE | Freq: Two times a day (BID) | ORAL | Status: DC | PRN

## 2014-05-24 NOTE — Telephone Encounter (Signed)
Rx's sent to the pharmacy([facility]) by e-script.//AB/CMA

## 2014-05-24 NOTE — Assessment & Plan Note (Signed)
hgba1c acceptable, minimize simple carbs. Increase exercise as tolerated. Continue current meds. Numbers were improved with new meds but up to 171 today with acute illness. Increase proteins and minimize carbs

## 2014-05-24 NOTE — Assessment & Plan Note (Signed)
Refilled Albuterol, given rx for Tessalon, medrol dosepak and Doxycycline, encouraged to wait a day or so to start the Antibiotic, if improving do not fill. Encouraged increased rest and hydration, add probiotics, zinc such as Coldeze or Xicam. Treat fevers as needed. Mucinex

## 2014-05-24 NOTE — Progress Notes (Signed)
[name] [name]  [id number] Oct 26, 1955 05/24/2014      Progress Note-Follow Up  Subjective  Chief Complaint  Chief Complaint  Patient presents with  . Cough    dry and productive-x 12 days    HPI  Patient is a 58 y.o. female in today for routine medical care. Patient is noting symptoms for roughly 2 weeks now. She has malaise, fatigue, shortness of breath and cough. She's had low-grade fevers and chills and myalgias. She has headache and slight sore throat with cough but no chronic headache. Minor head congestion with clear rhinorrhea is noted. No ear pain. Chest pain is noted with coughing but no other time. She has shortness of breath with any exertion or talking. No wheezing but she is out of her albuterol. No GI or GU complaints. Has tried some Tussin and over-the-counter with minimal results.  Past Medical History  Diagnosis Date  . Hypothyroidism   . Diabetes mellitus type II   . Fatty liver     abnormal transaminases; negative work up  . Atypical chest pain     normal coronaries and LV function by cath 10/2011  . GERD (gastroesophageal reflux disease)   . Osteoarthritis     chronic, right knee  . Plantar fasciitis   . Chronic pain disorder     neck and shoulder  . HTN (hypertension)   . HLD (hyperlipidemia)   . Asthma   . Hip pain, bilateral 07/19/2012  . Other malaise and fatigue 01/27/2013  . CTS (carpal tunnel syndrome) 01/27/2013    right  . Hypokalemia 05/25/2013    Improved stopping HCTZ. Was noted to have an elevated glucose when K was low. Recheck renal next week after starting Maxzide  . Esophageal reflux 08/25/2013    [facility], Dr Barth Kirks    Past Surgical History  Procedure Laterality Date  . Cholecystectomy    . Tonsillectomy  1975  . Knee arthroscopy  1998, 2007  . Vesicovaginal fistula closure w/ tah  1995  . Abdominal hysterectomy  1982  . Left heart catheterization with coronary angiogram N/A 11/21/2011    Procedure: LEFT HEART  CATHETERIZATION WITH CORONARY ANGIOGRAM;  Surgeon: Josue Hector, MD;  Location: [facility] CATH LAB;  Service: Cardiovascular;  Laterality: N/A;    Family History  Problem Relation Age of Onset  . Alzheimer's disease Mother   . Diabetes type II Mother   . Hiatal hernia Mother   . Diabetes Mother   . Emphysema Father   . Cancer Father     leukemia  . Colon cancer Neg Hx   . Depression Sister     suicide  . Diabetes Sister     History   Social History  . Marital Status: Married    Spouse Name: N/A    Number of Children: 1  . 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 Smoking status: Never Smoker   . Smokeless tobacco: Never Used  . Alcohol Use: No  . Drug Use: No  . Sexual Activity: No   Other Topics Concern  . Not on file   Social History Narrative   Multiple family members with intolerance to statins.    Current Outpatient Prescriptions on File Prior to Visit  Medication Sig Dispense Refill  . Alcohol Swabs (ALCOHOL PADS) 70 % PADS     . aspirin 81 MG tablet Take 81 mg by mouth daily.      Marland Kitchen  diltiazem (CARDIZEM SR) 60 MG 12 hr capsule Take 1 capsule (60 mg total) by mouth daily.    . Fluticasone-Salmeterol (ADVAIR DISKUS) 250-50 MCG/DOSE AEPB INHALE 1 PUFF INTO LUNGS 2 TIMES DAILY 3 each 3  . FORA V12 BLOOD GLUCOSE TEST test strip     . glimepiride (AMARYL) 4 MG tablet TAKE 1 TABLET DAILY 90 tablet 3  . Glucosamine-Chondroitin (OSTEO BI-FLEX REGULAR STRENGTH) 250-200 MG TABS Take 1 tablet by mouth 2 (two) times daily.     . Lite Touch Lancets MISC     . metoCLOPramide (REGLAN) 10 MG tablet Take 1 tablet (10 mg total) by mouth 2 (two) times daily.    . Multiple Vitamin (MULTIVITAMIN) tablet Take 1 tablet by mouth daily.    . niacin (NIASPAN) 500 MG CR tablet 1 tab po qhs with lowfat snack and Aspirin 1/2 hour prior  Failed all statins and welchol 30 tablet 5  . pantoprazole (PROTONIX) 40 MG tablet TAKE 1 TABLET DAILY  90 tablet 0  . sitaGLIPtin-metformin (JANUMET) 50-1000 MG per tablet Take 1 tablet by mouth 2 (two) times daily with a meal. 180 tablet 1  . SYNTHROID 88 MCG tablet TAKE 1 TABLET DAILY 90 tablet 1  . triamterene-hydrochlorothiazide (MAXZIDE-25) 37.5-25 MG per tablet TAKE 1 TABLET DAILY 90 tablet 1  . Pitavastatin Calcium (LIVALO) 1 MG TABS 3 x a week (Patient not taking: Reported on 05/24/2014) 10 tablet 1   No current facility-administered medications on file prior to visit.    Allergies  Allergen Reactions  . Epinephrine Other (See Comments)    Pass out   . Simvastatin     REACTION: back ache  . Statins     myalgias  . Welchol [Colesevelam Hcl]     myalgia    Review of Systems  Review of Systems  Constitutional: Negative for fever and malaise/fatigue.  HENT: Positive for congestion.   Eyes: Negative for discharge.  Respiratory: Positive for cough, sputum production, shortness of breath and wheezing.   Cardiovascular: Positive for chest pain. Negative for palpitations and leg swelling.  Gastrointestinal: Negative for nausea, abdominal pain and diarrhea.  Genitourinary: Negative for dysuria.  Musculoskeletal: Negative for falls.  Skin: Negative for rash.  Neurological: Positive for headaches. Negative for loss of consciousness.  Endo/Heme/Allergies: Negative for polydipsia.  Psychiatric/Behavioral: Negative for depression and suicidal ideas. The patient is not nervous/anxious and does not have insomnia.     Objective  BP 131/71 mmHg  Pulse 84  Temp(Src) 97.9 F (36.6 C) (Oral)  Ht 5\' 6"  (1.676 m)  Wt 168 lb 9.6 oz (76.476 kg)  BMI 27.23 kg/m2  SpO2 99%  Physical Exam  Physical Exam  Constitutional: She is oriented to person, place, and time and well-developed, well-nourished, and in no distress. No distress.  HENT:  Head: Normocephalic and atraumatic.  Eyes: Conjunctivae are normal.  Neck: Neck supple. No thyromegaly present.  Cardiovascular: Normal rate,  regular rhythm and normal heart sounds.   No murmur heard. Pulmonary/Chest: Effort normal. She has no wheezes. She has rales.  Slight, right  Base Cough with deep breathing  Abdominal: She exhibits no distension and no mass.  Musculoskeletal: She exhibits no edema.  Lymphadenopathy:    She has cervical adenopathy.  Neurological: She is alert and oriented to person, place, and time.  Skin: Skin is warm and dry. No rash noted. She is not diaphoretic.  Psychiatric: Memory, affect and judgment normal.    Lab Results  Component Value Date  TSH 1.78 04/29/2014   Lab Results  Component Value Date   WBC 6.9 04/29/2014   HGB 13.3 04/29/2014   HCT 40.5 04/29/2014   MCV 88.0 04/29/2014   PLT 205.0 04/29/2014   Lab Results  Component Value Date   CREATININE 1.1 04/29/2014   BUN 19 04/29/2014   NA 136 04/29/2014   K 4.5 04/29/2014   CL 97 04/29/2014   CO2 27 04/29/2014   Lab Results  Component Value Date   ALT 78* 04/29/2014   AST 71* 04/29/2014   ALKPHOS 64 04/29/2014   BILITOT 0.7 04/29/2014   Lab Results  Component Value Date   CHOL 222* 04/29/2014   Lab Results  Component Value Date   HDL 42.60 04/29/2014   Lab Results  Component Value Date   LDLCALC 136* 08/19/2013   Lab Results  Component Value Date   TRIG 237.0* 04/29/2014   Lab Results  Component Value Date   CHOLHDL 5 04/29/2014     Assessment & Plan  Esophageal reflux Avoid offending foods, start probiotics. Do not eat large meals in late evening and consider raising head of bed.   Diabetes mellitus type 2 in obese hgba1c acceptable, minimize simple carbs. Increase exercise as tolerated. Continue current meds. Numbers were improved with new meds but up to 171 today with acute illness. Increase proteins and minimize carbs  Asthma with acute exacerbation Refilled Albuterol, given rx for Tessalon, medrol dosepak and Doxycycline, encouraged to wait a day or so to start the Antibiotic, if improving do  not fill. Encouraged increased rest and hydration, add probiotics, zinc such as Coldeze or Xicam. Treat fevers as needed. Mucinex

## 2014-05-24 NOTE — Progress Notes (Signed)
Pre visit review using our clinic review tool, if applicable. No additional management support is needed unless otherwise documented below in the visit note. 

## 2014-05-24 NOTE — Assessment & Plan Note (Signed)
Avoid offending foods, start probiotics. Do not eat large meals in late evening and consider raising head of bed.  

## 2014-05-24 NOTE — Telephone Encounter (Signed)
Caller name:Ortlieb, Leta Relation to GM:WNUU Call back number:[phone] Pharmacy:[facility] [address]  Reason for call: pt was seen this morning, states that her 3 rx [facility] , [facility]

## 2014-05-24 NOTE — Patient Instructions (Signed)
Encouraged increased rest and hydration, add probiotics such as Digestive Advantage or [employer] colon health, zinc such as Coldeze or Xicam, Zinc 50 mg daily. Treat fevers as needed. Plain Mucinex twice a day x 10 days Elderberry liquid twice daily (online Luckyvitamins[employer]com, [employer])     Acute Bronchitis Bronchitis is inflammation of the airways that extend from the windpipe into the lungs (bronchi). The inflammation often causes mucus to develop. This leads to a cough, which is the most common symptom of bronchitis.  In acute bronchitis, the condition usually develops suddenly and goes away over time, usually in a couple weeks. Smoking, allergies, and asthma can make bronchitis worse. Repeated episodes of bronchitis may cause further lung problems.  CAUSES Acute bronchitis is most often caused by the same virus that causes a cold. The virus can spread from person to person (contagious) through coughing, sneezing, and touching contaminated objects. SIGNS AND SYMPTOMS   Cough.   Fever.   Coughing up mucus.   Body aches.   Chest congestion.   Chills.   Shortness of breath.   Sore throat.  DIAGNOSIS  Acute bronchitis is usually diagnosed through a physical exam. Your health care provider will also ask you questions about your medical history. Tests, such as chest X-rays, are sometimes done to rule out other conditions.  TREATMENT  Acute bronchitis usually goes away in a couple weeks. Oftentimes, no medical treatment is necessary. Medicines are sometimes given for relief of fever or cough. Antibiotic medicines are usually not needed but may be prescribed in certain situations. In some cases, an inhaler may be recommended to help reduce shortness of breath and control the cough. A cool mist vaporizer may also be used to help thin bronchial secretions and make it easier to clear the chest.  HOME CARE INSTRUCTIONS  Get plenty of rest.   Drink enough fluids to keep your urine  clear or pale yellow (unless you have a medical condition that requires fluid restriction). Increasing fluids may help thin your respiratory secretions (sputum) and reduce chest congestion, and it will prevent dehydration.   Take medicines only as directed by your health care provider.  If you were prescribed an antibiotic medicine, finish it all even if you start to feel better.  Avoid smoking and secondhand smoke. Exposure to cigarette smoke or irritating chemicals will make bronchitis worse. If you are a smoker, consider using nicotine gum or skin patches to help control withdrawal symptoms. Quitting smoking will help your lungs heal faster.   Reduce the chances of another bout of acute bronchitis by washing your hands frequently, avoiding people with cold symptoms, and trying not to touch your hands to your mouth, nose, or eyes.   Keep all follow-up visits as directed by your health care provider.  SEEK MEDICAL CARE IF: Your symptoms do not improve after 1 week of treatment.  SEEK IMMEDIATE MEDICAL CARE IF:  You develop an increased fever or chills.   You have chest pain.   You have severe shortness of breath.  You have bloody sputum.   You develop dehydration.  You faint or repeatedly feel like you are going to pass out.  You develop repeated vomiting.  You develop a severe headache. MAKE SURE YOU:   Understand these instructions.  Will watch your condition.  Will get help right away if you are not doing well or get worse. Document Released: 06/20/2004 Document Revised: 09/27/2013 Document Reviewed: 11/03/2012 [facility] Patient Information 2015 [place], [place]. This information is not intended  to replace advice given to you by your health care provider. Make sure you discuss any questions you have with your health care provider.  

## 2014-05-26 ENCOUNTER — Encounter: Payer: Self-pay | Admitting: Family Medicine

## 2014-06-07 ENCOUNTER — Encounter: Payer: Self-pay | Admitting: Family Medicine

## 2014-06-09 MED ORDER — GLIMEPIRIDE 4 MG PO TABS: 4.0000 mg | ORAL_TABLET | Freq: Two times a day (BID) | ORAL | Status: DC

## 2014-06-09 NOTE — Telephone Encounter (Signed)
So her sugars running hi can cause fatigue. She can increase her Amaryl to 4 mg po bid disp #60 with 3 rf and make sure she is coming in roughly 2 months for follow up. Thanks

## 2014-07-18 ENCOUNTER — Telehealth: Payer: Self-pay | Admitting: Nurse Practitioner

## 2014-07-18 NOTE — Telephone Encounter (Signed)
Former patient of Dr. Sharlett Iles will come in and see Dr. Carlean Purl tomorrow at 1:30.  She is having worsening GERD symptoms

## 2014-07-19 ENCOUNTER — Encounter: Payer: Self-pay | Admitting: Internal Medicine

## 2014-07-19 ENCOUNTER — Other Ambulatory Visit (INDEPENDENT_AMBULATORY_CARE_PROVIDER_SITE_OTHER): Payer: BLUE CROSS/BLUE SHIELD

## 2014-07-19 ENCOUNTER — Ambulatory Visit (INDEPENDENT_AMBULATORY_CARE_PROVIDER_SITE_OTHER): Payer: BLUE CROSS/BLUE SHIELD | Admitting: Internal Medicine

## 2014-07-19 VITALS — BP 138/74 | HR 68 | Ht 68.0 in | Wt 167.0 lb

## 2014-07-19 DIAGNOSIS — R10816 Epigastric abdominal tenderness: Secondary | ICD-10-CM

## 2014-07-19 DIAGNOSIS — E1143 Type 2 diabetes mellitus with diabetic autonomic (poly)neuropathy: Secondary | ICD-10-CM | POA: Insufficient documentation

## 2014-07-19 DIAGNOSIS — K219 Gastro-esophageal reflux disease without esophagitis: Secondary | ICD-10-CM

## 2014-07-19 DIAGNOSIS — K3184 Gastroparesis: Principal | ICD-10-CM

## 2014-07-19 LAB — LIPASE: Lipase: 35 U/L (ref 11.0–59.0)

## 2014-07-19 LAB — AMYLASE: AMYLASE: 54 U/L (ref 27–131)

## 2014-07-19 NOTE — Patient Instructions (Signed)
Your physician has requested that you go to the basement for the following lab work before leaving today: Amylase, Lipase  We will be in touch with results and plans.   I appreciate the opportunity to care for you. Silvano Rusk, M.D., [facility]

## 2014-07-20 ENCOUNTER — Telehealth: Payer: Self-pay | Admitting: Internal Medicine

## 2014-07-20 NOTE — Telephone Encounter (Signed)
No pancreatitis Please change and rx metaclopramide to 10 mg qac and hs # 120 RF # 2 See me in Late April

## 2014-07-20 NOTE — Telephone Encounter (Signed)
Pt calling for lab results. Please advise. 

## 2014-07-20 NOTE — Progress Notes (Signed)
Quick Note:  She cal;led and phone note has info ______

## 2014-07-21 ENCOUNTER — Encounter: Payer: Self-pay | Admitting: Internal Medicine

## 2014-07-21 MED ORDER — METOCLOPRAMIDE HCL 10 MG PO TABS: 10.0000 mg | ORAL_TABLET | Freq: Three times a day (TID) | ORAL | Status: DC

## 2014-07-21 NOTE — Telephone Encounter (Signed)
Patient notified  RX sent  Follow up scheduled for 09/12/14 4:00

## 2014-07-21 NOTE — Progress Notes (Signed)
   Subjective:  Cc: bloating, vomiting  Patient ID: [name] [name], female    DOB: April 13, 1956, 59 y.o.   MRN: [id number]  HPI The patient is here with a history of diabetic gastroparesis symptoms but NL gastric emptying study 2013.Marland Kitchen Peviously seen and evaluated by Dr. Verl Blalock. EGD 2013 was notable for some retained food and fundic gland polyps.  She has been taking metaclopramide 10 mg bid. It helped for a while but in past months she has been having more post-prandial bloating and regurgitation/vomiting. No side effects from metaclopramide.  She has been experiencing increasing blood glucose.Hgb A1C went from 6.4 11 months ago to 6.7 6 months ago and 2 months ago was 7.5%.   Medications, allergies, past medical history, past surgical history, family history and social history are reviewed and updated in the EMR.  Review of Systems As above    Objective:   Physical Exam BP 138/74 mmHg  Pulse 68  Ht 5\' 8"  (1.727 m)  Wt 167 lb (75.751 kg)  BMI 25.40 kg/m2 General:  NAD Eyes:   anicteric Lungs:  clear Heart:  S1S2 no rubs, murmurs or gallops Abdomen:  soft and mildly tender epigastrium , BS+ no succussion splash Ext:   no edema  Data Reviewed:  As per HPI    Assessment & Plan:  Diabetic gastroparesis  Gastroesophageal reflux disease without esophagitis  Epigastric abdominal tenderness -  1. Amylase and lipase checked and were NL so do not think sitaGLPtin in Janumet is causing problems 2. I have tod her to take metaclopramide 10 mg ac/hs and see me in 2 months 3. Keep working on DM control

## 2014-07-27 ENCOUNTER — Telehealth: Payer: Self-pay | Admitting: Internal Medicine

## 2014-07-27 MED ORDER — METOCLOPRAMIDE HCL 10 MG PO TABS: 10.0000 mg | ORAL_TABLET | Freq: Three times a day (TID) | ORAL | Status: DC

## 2014-07-27 NOTE — Telephone Encounter (Signed)
Informed patient that rx  re[facility]sent [facility] requested.

## 2014-08-01 ENCOUNTER — Encounter: Payer: Self-pay | Admitting: Family Medicine

## 2014-08-02 ENCOUNTER — Telehealth: Payer: Self-pay | Admitting: Family Medicine

## 2014-08-02 DIAGNOSIS — E119 Type 2 diabetes mellitus without complications: Secondary | ICD-10-CM

## 2014-08-02 DIAGNOSIS — R945 Abnormal results of liver function studies: Secondary | ICD-10-CM

## 2014-08-02 DIAGNOSIS — E785 Hyperlipidemia, unspecified: Secondary | ICD-10-CM

## 2014-08-02 DIAGNOSIS — E038 Other specified hypothyroidism: Secondary | ICD-10-CM

## 2014-08-02 NOTE — Telephone Encounter (Signed)
Labs entered.

## 2014-08-03 ENCOUNTER — Other Ambulatory Visit (INDEPENDENT_AMBULATORY_CARE_PROVIDER_SITE_OTHER): Payer: BLUE CROSS/BLUE SHIELD

## 2014-08-03 DIAGNOSIS — E119 Type 2 diabetes mellitus without complications: Secondary | ICD-10-CM

## 2014-08-03 DIAGNOSIS — R7989 Other specified abnormal findings of blood chemistry: Secondary | ICD-10-CM

## 2014-08-03 DIAGNOSIS — E785 Hyperlipidemia, unspecified: Secondary | ICD-10-CM

## 2014-08-03 DIAGNOSIS — E038 Other specified hypothyroidism: Secondary | ICD-10-CM

## 2014-08-03 DIAGNOSIS — K7689 Other specified diseases of liver: Secondary | ICD-10-CM

## 2014-08-03 DIAGNOSIS — R945 Abnormal results of liver function studies: Secondary | ICD-10-CM

## 2014-08-03 LAB — COMPREHENSIVE METABOLIC PANEL
ALT: 59 U/L — ABNORMAL HIGH (ref 0–35)
AST: 62 U/L — ABNORMAL HIGH (ref 0–37)
Albumin: 4.4 g/dL (ref 3.5–5.2)
Alkaline Phosphatase: 51 U/L (ref 39–117)
BILIRUBIN TOTAL: 0.8 mg/dL (ref 0.2–1.2)
BUN: 11 mg/dL (ref 6–23)
CO2: 31 mEq/L (ref 19–32)
CREATININE: 1.12 mg/dL (ref 0.40–1.20)
Calcium: 8.4 mg/dL (ref 8.4–10.5)
Chloride: 96 mEq/L (ref 96–112)
GFR: 52.77 mL/min — ABNORMAL LOW (ref >60.00)
Glucose, Bld: 165 mg/dL — ABNORMAL HIGH (ref 70–99)
Potassium: 3.9 mEq/L (ref 3.5–5.1)
Sodium: 138 mEq/L (ref 135–145)
Total Protein: 7.4 g/dL (ref 6.0–8.3)

## 2014-08-03 LAB — LIPID PANEL
CHOLESTEROL: 215 mg/dL — AB (ref 0–200)
HDL: 50.6 mg/dL (ref >39.00)
NonHDL: 164.4
Total CHOL/HDL Ratio: 4
Triglycerides: 210 mg/dL — ABNORMAL HIGH (ref 0.0–149.0)
VLDL: 42 mg/dL — ABNORMAL HIGH (ref 0.0–40.0)

## 2014-08-03 LAB — LDL CHOLESTEROL, DIRECT: Direct LDL: 140 mg/dL

## 2014-08-03 LAB — HEMOGLOBIN A1C: Hgb A1c MFr Bld: 7.2 % — ABNORMAL HIGH (ref 4.6–6.5)

## 2014-08-03 LAB — TSH: TSH: 1.23 u[IU]/mL (ref 0.35–4.50)

## 2014-08-08 ENCOUNTER — Other Ambulatory Visit (INDEPENDENT_AMBULATORY_CARE_PROVIDER_SITE_OTHER): Payer: BLUE CROSS/BLUE SHIELD

## 2014-08-08 ENCOUNTER — Telehealth: Payer: Self-pay | Admitting: Family Medicine

## 2014-08-08 ENCOUNTER — Ambulatory Visit (INDEPENDENT_AMBULATORY_CARE_PROVIDER_SITE_OTHER): Payer: BLUE CROSS/BLUE SHIELD | Admitting: Family Medicine

## 2014-08-08 ENCOUNTER — Encounter: Payer: Self-pay | Admitting: Family Medicine

## 2014-08-08 VITALS — BP 128/80 | HR 95 | Temp 98.0°F | Ht 66.0 in | Wt 170.0 lb

## 2014-08-08 DIAGNOSIS — M79652 Pain in left thigh: Secondary | ICD-10-CM

## 2014-08-08 DIAGNOSIS — M79651 Pain in right thigh: Secondary | ICD-10-CM

## 2014-08-08 DIAGNOSIS — E1169 Type 2 diabetes mellitus with other specified complication: Secondary | ICD-10-CM

## 2014-08-08 DIAGNOSIS — R110 Nausea: Secondary | ICD-10-CM

## 2014-08-08 DIAGNOSIS — R202 Paresthesia of skin: Secondary | ICD-10-CM

## 2014-08-08 DIAGNOSIS — E782 Mixed hyperlipidemia: Secondary | ICD-10-CM

## 2014-08-08 DIAGNOSIS — R112 Nausea with vomiting, unspecified: Secondary | ICD-10-CM | POA: Insufficient documentation

## 2014-08-08 DIAGNOSIS — M542 Cervicalgia: Secondary | ICD-10-CM

## 2014-08-08 DIAGNOSIS — E119 Type 2 diabetes mellitus without complications: Secondary | ICD-10-CM

## 2014-08-08 DIAGNOSIS — R109 Unspecified abdominal pain: Secondary | ICD-10-CM

## 2014-08-08 DIAGNOSIS — I10 Essential (primary) hypertension: Secondary | ICD-10-CM

## 2014-08-08 DIAGNOSIS — E1143 Type 2 diabetes mellitus with diabetic autonomic (poly)neuropathy: Secondary | ICD-10-CM

## 2014-08-08 DIAGNOSIS — K3184 Gastroparesis: Secondary | ICD-10-CM

## 2014-08-08 DIAGNOSIS — J309 Allergic rhinitis, unspecified: Secondary | ICD-10-CM

## 2014-08-08 DIAGNOSIS — E669 Obesity, unspecified: Secondary | ICD-10-CM

## 2014-08-08 DIAGNOSIS — R790 Abnormal level of blood mineral: Secondary | ICD-10-CM

## 2014-08-08 HISTORY — DX: Nausea: R11.0

## 2014-08-08 LAB — COMPREHENSIVE METABOLIC PANEL
ALBUMIN: 4.3 g/dL (ref 3.5–5.2)
ALK PHOS: 59 U/L (ref 39–117)
ALT: 49 U/L — ABNORMAL HIGH (ref 0–35)
AST: 47 U/L — AB (ref 0–37)
BUN: 13 mg/dL (ref 6–23)
CO2: 29 mEq/L (ref 19–32)
Calcium: 8.3 mg/dL — ABNORMAL LOW (ref 8.4–10.5)
Chloride: 99 mEq/L (ref 96–112)
Creatinine, Ser: 1.11 mg/dL (ref 0.40–1.20)
GFR: 53.32 mL/min — ABNORMAL LOW (ref >60.00)
GLUCOSE: 167 mg/dL — AB (ref 70–99)
POTASSIUM: 3.7 meq/L (ref 3.5–5.1)
Sodium: 138 mEq/L (ref 135–145)
TOTAL PROTEIN: 7.1 g/dL (ref 6.0–8.3)
Total Bilirubin: 0.6 mg/dL (ref 0.2–1.2)

## 2014-08-08 LAB — CBC WITH DIFFERENTIAL/PLATELET
BASOS ABS: 0 10*3/uL (ref 0.0–0.1)
Basophils Relative: 0.5 % (ref 0.0–3.0)
Eosinophils Absolute: 0.2 10*3/uL (ref 0.0–0.7)
Eosinophils Relative: 2.5 % (ref 0.0–5.0)
HCT: 35 % — ABNORMAL LOW (ref 36.0–46.0)
HEMOGLOBIN: 11.9 g/dL — AB (ref 12.0–15.0)
LYMPHS ABS: 1.8 10*3/uL (ref 0.7–4.0)
Lymphocytes Relative: 28.7 % (ref 12.0–46.0)
MCHC: 33.9 g/dL (ref 30.0–36.0)
MCV: 85.3 fl (ref 78.0–100.0)
Monocytes Absolute: 0.5 10*3/uL (ref 0.1–1.0)
Monocytes Relative: 7.6 % (ref 3.0–12.0)
NEUTROS ABS: 3.9 10*3/uL (ref 1.4–7.7)
Neutrophils Relative %: 60.7 % (ref 43.0–77.0)
Platelets: 199 10*3/uL (ref 150.0–400.0)
RBC: 4.1 Mil/uL (ref 3.87–5.11)
RDW: 13.6 % (ref 11.5–15.5)
WBC: 6.4 10*3/uL (ref 4.0–10.5)

## 2014-08-08 LAB — LDL CHOLESTEROL, DIRECT: LDL DIRECT: 131 mg/dL

## 2014-08-08 LAB — VITAMIN B12: Vitamin B-12: 275 pg/mL (ref 211–911)

## 2014-08-08 LAB — SEDIMENTATION RATE: Sed Rate: 17 mm/hr (ref 0–22)

## 2014-08-08 LAB — TSH: TSH: 0.64 u[IU]/mL (ref 0.35–4.50)

## 2014-08-08 LAB — LIPID PANEL
CHOL/HDL RATIO: 4
Cholesterol: 204 mg/dL — ABNORMAL HIGH (ref 0–200)
HDL: 50.8 mg/dL (ref >39.00)
NONHDL: 153.2
Triglycerides: 318 mg/dL — ABNORMAL HIGH (ref 0.0–149.0)
VLDL: 63.6 mg/dL — ABNORMAL HIGH (ref 0.0–40.0)

## 2014-08-08 LAB — C-REACTIVE PROTEIN: CRP: 0.7 mg/dL (ref 0.5–20.0)

## 2014-08-08 LAB — MAGNESIUM: Magnesium: 0.7 mg/dL — CL (ref 1.5–2.5)

## 2014-08-08 LAB — LIPASE: Lipase: 23 U/L (ref 11.0–59.0)

## 2014-08-08 LAB — AMYLASE: AMYLASE: 58 U/L (ref 27–131)

## 2014-08-08 LAB — VITAMIN D 25 HYDROXY (VIT D DEFICIENCY, FRACTURES): VITD: 29.03 ng/mL — ABNORMAL LOW (ref 30.00–100.00)

## 2014-08-08 MED ORDER — MAGNESIUM 200 MG PO TABS
400.0000 mg | ORAL_TABLET | Freq: Every day | ORAL | Status: DC
Start: 2014-08-08 — End: 2014-10-19

## 2014-08-08 NOTE — Progress Notes (Signed)
[name] [name]  [id number] Aug 29, 1955 08/08/2014      Progress Note-Follow Up  Subjective  Chief Complaint  Chief Complaint  Patient presents with  . Follow-up    HPI  Patient is a 59 y.o. female in today for routine medical care. Patient in today with numerous complaints. She is complaining of chronic allergies and congestion has frequent postnasal drip. No fevers or chills. No ear pain or sore throat. She is also complaining of some bilateral knee pain left greater than right. She denies any warmth redness or swelling. She denies any injury. She does acknowledge she is trouble with aching up into her thighs at times. She has persistent GI issues most notably nausea and heartburn. She is following with gastroenterology and they tried a course of metoclopramide but she did not tolerate 4 times a day dosing so is now down to daily dosing. No acute recent illness. Is complaining of episodes of whole body numbness and tingling off and on for several months. It can occur in her extremities but also her face last about 15 minutes and then resolves she denies any inciting incident or pattern. Denies CP/palp/SOB/HA/congestion/fevers/GI or GU c/o. Taking meds as prescribed  Past Medical History  Diagnosis Date  . Hypothyroidism   . Diabetes mellitus type II   . Fatty liver     abnormal transaminases; negative work up  . Atypical chest pain     normal coronaries and LV function by cath 10/2011  . GERD (gastroesophageal reflux disease)   . Osteoarthritis     chronic, right knee  . Plantar fasciitis   . Chronic pain disorder     neck and shoulder  . HTN (hypertension)   . HLD (hyperlipidemia)   . Asthma   . Hip pain, bilateral 07/19/2012  . Other malaise and fatigue 01/27/2013  . CTS (carpal tunnel syndrome) 01/27/2013    right  . Hypokalemia 05/25/2013    Improved stopping HCTZ. Was noted to have an elevated glucose when K was low. Recheck renal next week after starting Maxzide  .  Esophageal reflux 08/25/2013    [facility], Dr Barth Kirks  . Fundic gland polyps of stomach, benign     Past Surgical History  Procedure Laterality Date  . Cholecystectomy    . Tonsillectomy  1975  . Knee arthroscopy  1998, 2007  . Vesicovaginal fistula closure w/ tah  1995  . Abdominal hysterectomy  1982  . Left heart catheterization with coronary angiogram N/A 11/21/2011    Procedure: LEFT HEART CATHETERIZATION WITH CORONARY ANGIOGRAM;  Surgeon: Josue Hector, MD;  Location: [facility] CATH LAB;  Service: Cardiovascular;  Laterality: N/A;  . Colonoscopy    . Esophagogastroduodenoscopy      Family History  Problem Relation Age of Onset  . Alzheimer's disease Mother   . Diabetes type II Mother   . Hiatal hernia Mother   . Diabetes Mother   . Emphysema Father   . Cancer Father     leukemia  . Colon cancer Neg Hx   . Depression Sister     suicide  . Diabetes Sister     History   Social History  . Marital Status: Married    Spouse Name: N/A  . Number of Children: 1  . Years of Education: N/A   Occupational History  . IT TECH, admin. assistant    Social History Main Topics  . Smoking status: Never Smoker   . Smokeless tobacco: Never Used  . Alcohol  Use: No  . Drug Use: No  . Sexual Activity: No   Other Topics Concern  . Not on file   Social History Narrative   Multiple family members with intolerance to statins.    Current Outpatient Prescriptions on File Prior to Visit  Medication Sig Dispense Refill  . albuterol (PROVENTIL HFA;VENTOLIN HFA) 108 (90 BASE) MCG/ACT inhaler Inhale 2 puffs into the lungs every 6 (six) hours as needed for wheezing or shortness of breath. 1 Inhaler 0  . Alcohol Swabs (ALCOHOL PADS) 70 % PADS Use prior to checking blood sugars to clean skin    . aspirin 81 MG tablet Take 81 mg by mouth daily.      . Fluticasone-Salmeterol (ADVAIR DISKUS) 250-50 MCG/DOSE AEPB INHALE 1 PUFF INTO LUNGS 2 TIMES DAILY 3 each 3  . FORA V12 BLOOD GLUCOSE  TEST test strip     . glimepiride (AMARYL) 4 MG tablet Take 1 tablet (4 mg total) by mouth 2 (two) times daily. 60 tablet 3  . Glucosamine-Chondroitin (OSTEO BI-FLEX REGULAR STRENGTH) 250-200 MG TABS Take 1 tablet by mouth 2 (two) times daily.     . Lite Touch Lancets MISC     . metoCLOPramide (REGLAN) 10 MG tablet Take 1 tablet (10 mg total) by mouth 4 (four) times daily -  before meals and at bedtime. 120 tablet 2  . Multiple Vitamin (MULTIVITAMIN) tablet Take 1 tablet by mouth daily.    . niacin (NIASPAN) 500 MG CR tablet 1 tab po qhs with lowfat snack and Aspirin 1/2 hour prior  Failed all statins and welchol 30 tablet 5  . pantoprazole (PROTONIX) 40 MG tablet TAKE 1 TABLET DAILY 90 tablet 0  . sitaGLIPtin-metformin (JANUMET) 50-1000 MG per tablet Take 1 tablet by mouth 2 (two) times daily with a meal. 180 tablet 1  . SYNTHROID 88 MCG tablet TAKE 1 TABLET DAILY 90 tablet 1  . triamterene-hydrochlorothiazide (MAXZIDE-25) 37.5-25 MG per tablet TAKE 1 TABLET DAILY 90 tablet 1   No current facility-administered medications on file prior to visit.    Allergies  Allergen Reactions  . Epinephrine Other (See Comments)    Pass out   . Simvastatin     REACTION: back ache  . Statins     myalgias  . Welchol [Colesevelam Hcl]     myalgia    Review of Systems  Review of Systems  Constitutional: Negative for fever and malaise/fatigue.  HENT: Positive for congestion.   Eyes: Negative for discharge.  Respiratory: Negative for cough and shortness of breath.   Cardiovascular: Negative for chest pain, palpitations and leg swelling.  Gastrointestinal: Positive for heartburn and nausea. Negative for abdominal pain and diarrhea.  Genitourinary: Negative for dysuria.  Musculoskeletal: Positive for back pain, joint pain and neck pain. Negative for falls.  Skin: Negative for rash.  Neurological: Positive for sensory change. Negative for loss of consciousness and headaches.  Endo/Heme/Allergies:  Negative for polydipsia.  Psychiatric/Behavioral: Negative for depression and suicidal ideas. The patient is not nervous/anxious and does not have insomnia.     Objective  BP 128/80 mmHg  Pulse 95  Temp(Src) 98 F (36.7 C) (Oral)  Ht 5\' 6"  (1.676 m)  Wt 170 lb (77.111 kg)  BMI 27.45 kg/m2  SpO2 96%  Physical Exam  Physical Exam  Constitutional: She is oriented to person, place, and time and well-developed, well-nourished, and in no distress. No distress.  HENT:  Head: Normocephalic and atraumatic.  Eyes: Conjunctivae are normal.  Neck: Neck  supple. No thyromegaly present.  Cardiovascular: Normal rate, regular rhythm and normal heart sounds.   No murmur heard. Pulmonary/Chest: Effort normal and breath sounds normal. She has no wheezes.  Abdominal: She exhibits no distension and no mass.  Musculoskeletal: She exhibits no edema.  Lymphadenopathy:    She has no cervical adenopathy.  Neurological: She is alert and oriented to person, place, and time.  Skin: Skin is warm and dry. No rash noted. She is not diaphoretic.  Psychiatric: Memory, affect and judgment normal.    Lab Results  Component Value Date   TSH 1.23 08/03/2014   Lab Results  Component Value Date   WBC 6.9 04/29/2014   HGB 13.3 04/29/2014   HCT 40.5 04/29/2014   MCV 88.0 04/29/2014   PLT 205.0 04/29/2014   Lab Results  Component Value Date   CREATININE 1.12 08/03/2014   BUN 11 08/03/2014   NA 138 08/03/2014   K 3.9 08/03/2014   CL 96 08/03/2014   CO2 31 08/03/2014   Lab Results  Component Value Date   ALT 59* 08/03/2014   AST 62* 08/03/2014   ALKPHOS 51 08/03/2014   BILITOT 0.8 08/03/2014   Lab Results  Component Value Date   CHOL 215* 08/03/2014   Lab Results  Component Value Date   HDL 50.60 08/03/2014   Lab Results  Component Value Date   LDLCALC 136* 08/19/2013   Lab Results  Component Value Date   TRIG 210.0* 08/03/2014   Lab Results  Component Value Date   CHOLHDL 4  08/03/2014     Assessment & Plan  Hypertension Well controlled, no changes to meds. Encouraged heart healthy diet such as the DASH diet and exercise as tolerated.    Allergic rhinitis Encouraged Claritin bid, Flonase and probiotics daily   Diabetes mellitus type 2 in obese hgba1c acceptable, minimize simple carbs. Increase exercise as tolerated. Continue current meds   DYSLIPIDEMIA Encouraged heart healthy diet, increase exercise, avoid trans fats, consider a krill oil cap daily   Hypomagnesemia Started on Magnesium 400 mg daily and recheck next week likely responsible for her paresthesias.    Diabetic gastroparesis Is following with gastroenterology, did not tolerate Metoclopromide qid has dropped to daily use.    Anterior neck pain Anterior neck pain and swelling at times. Will proceed with ultrasound to further investigate   Abdominal pain Proceed with abdominal ultrasound, try to change probiotics and maintain a bland, lowfat diet.

## 2014-08-08 NOTE — Patient Instructions (Signed)

## 2014-08-08 NOTE — Telephone Encounter (Signed)
Labs entered.

## 2014-08-08 NOTE — Telephone Encounter (Signed)
Critical Lab Result Magnesium 0.70

## 2014-08-10 ENCOUNTER — Encounter: Payer: Self-pay | Admitting: Family Medicine

## 2014-08-10 ENCOUNTER — Ambulatory Visit (HOSPITAL_BASED_OUTPATIENT_CLINIC_OR_DEPARTMENT_OTHER)
Admission: RE | Admit: 2014-08-10 | Discharge: 2014-08-10 | Disposition: A | Discharge status: Home / Self Care | Payer: BLUE CROSS/BLUE SHIELD | Source: Ambulatory Visit | Attending: Family Medicine | Admitting: Family Medicine

## 2014-08-10 DIAGNOSIS — M79652 Pain in left thigh: Secondary | ICD-10-CM

## 2014-08-10 DIAGNOSIS — M542 Cervicalgia: Secondary | ICD-10-CM | POA: Insufficient documentation

## 2014-08-10 DIAGNOSIS — R109 Unspecified abdominal pain: Secondary | ICD-10-CM | POA: Insufficient documentation

## 2014-08-10 DIAGNOSIS — R202 Paresthesia of skin: Secondary | ICD-10-CM

## 2014-08-10 DIAGNOSIS — M79651 Pain in right thigh: Secondary | ICD-10-CM

## 2014-08-10 DIAGNOSIS — E1143 Type 2 diabetes mellitus with diabetic autonomic (poly)neuropathy: Secondary | ICD-10-CM

## 2014-08-10 DIAGNOSIS — K760 Fatty (change of) liver, not elsewhere classified: Secondary | ICD-10-CM | POA: Insufficient documentation

## 2014-08-10 DIAGNOSIS — R110 Nausea: Secondary | ICD-10-CM

## 2014-08-10 DIAGNOSIS — I10 Essential (primary) hypertension: Secondary | ICD-10-CM

## 2014-08-10 DIAGNOSIS — E782 Mixed hyperlipidemia: Secondary | ICD-10-CM

## 2014-08-10 HISTORY — DX: Hypomagnesemia: E83.42

## 2014-08-10 LAB — ZINC: Zinc: 75 ug/dL (ref 60–130)

## 2014-08-10 NOTE — Assessment & Plan Note (Signed)
Is following with gastroenterology, did not tolerate Metoclopromide qid has dropped to daily use.

## 2014-08-10 NOTE — Assessment & Plan Note (Signed)
Started on Magnesium 400 mg daily and recheck next week likely responsible for her paresthesias.

## 2014-08-10 NOTE — Assessment & Plan Note (Signed)
Encouraged heart healthy diet, increase exercise, avoid trans fats, consider a krill oil cap daily 

## 2014-08-10 NOTE — Assessment & Plan Note (Signed)
hgba1c acceptable, minimize simple carbs. Increase exercise as tolerated. Continue current meds 

## 2014-08-10 NOTE — Assessment & Plan Note (Signed)
Proceed with abdominal ultrasound, try to change probiotics and maintain a bland, lowfat diet.

## 2014-08-10 NOTE — Assessment & Plan Note (Signed)
Anterior neck pain and swelling at times. Will proceed with ultrasound to further investigate

## 2014-08-10 NOTE — Assessment & Plan Note (Signed)
Well controlled, no changes to meds. Encouraged heart healthy diet such as the DASH diet and exercise as tolerated.  °

## 2014-08-10 NOTE — Assessment & Plan Note (Signed)
Encouraged Claritin bid, Flonase and probiotics daily

## 2014-08-11 ENCOUNTER — Other Ambulatory Visit: Payer: Self-pay | Admitting: Family Medicine

## 2014-08-11 DIAGNOSIS — K760 Fatty (change of) liver, not elsewhere classified: Secondary | ICD-10-CM

## 2014-08-15 ENCOUNTER — Encounter: Payer: Self-pay | Admitting: Family Medicine

## 2014-08-17 ENCOUNTER — Other Ambulatory Visit (INDEPENDENT_AMBULATORY_CARE_PROVIDER_SITE_OTHER): Payer: BLUE CROSS/BLUE SHIELD

## 2014-08-17 DIAGNOSIS — I10 Essential (primary) hypertension: Secondary | ICD-10-CM

## 2014-08-17 DIAGNOSIS — K3184 Gastroparesis: Secondary | ICD-10-CM

## 2014-08-17 DIAGNOSIS — R202 Paresthesia of skin: Secondary | ICD-10-CM | POA: Diagnosis not present

## 2014-08-17 DIAGNOSIS — E782 Mixed hyperlipidemia: Secondary | ICD-10-CM

## 2014-08-17 DIAGNOSIS — M79651 Pain in right thigh: Secondary | ICD-10-CM | POA: Diagnosis not present

## 2014-08-17 DIAGNOSIS — R790 Abnormal level of blood mineral: Secondary | ICD-10-CM

## 2014-08-17 DIAGNOSIS — E1143 Type 2 diabetes mellitus with diabetic autonomic (poly)neuropathy: Secondary | ICD-10-CM

## 2014-08-17 DIAGNOSIS — M542 Cervicalgia: Secondary | ICD-10-CM | POA: Diagnosis not present

## 2014-08-17 DIAGNOSIS — M79652 Pain in left thigh: Secondary | ICD-10-CM

## 2014-08-17 LAB — COMPREHENSIVE METABOLIC PANEL
ALT: 62 U/L — ABNORMAL HIGH (ref 0–35)
AST: 64 U/L — ABNORMAL HIGH (ref 0–37)
Albumin: 4.3 g/dL (ref 3.5–5.2)
Alkaline Phosphatase: 52 U/L (ref 39–117)
BUN: 15 mg/dL (ref 6–23)
CALCIUM: 8.9 mg/dL (ref 8.4–10.5)
CO2: 29 meq/L (ref 19–32)
Chloride: 97 mEq/L (ref 96–112)
Creatinine, Ser: 1.14 mg/dL (ref 0.40–1.20)
GFR: 51.7 mL/min — AB (ref >60.00)
GLUCOSE: 151 mg/dL — AB (ref 70–99)
Potassium: 3.5 mEq/L (ref 3.5–5.1)
SODIUM: 138 meq/L (ref 135–145)
TOTAL PROTEIN: 7.3 g/dL (ref 6.0–8.3)
Total Bilirubin: 0.9 mg/dL (ref 0.2–1.2)

## 2014-08-17 LAB — CBC
HCT: 36.3 % (ref 36.0–46.0)
Hemoglobin: 12.4 g/dL (ref 12.0–15.0)
MCHC: 34 g/dL (ref 30.0–36.0)
MCV: 87.3 fl (ref 78.0–100.0)
PLATELETS: 217 10*3/uL (ref 150.0–400.0)
RBC: 4.15 Mil/uL (ref 3.87–5.11)
RDW: 13.7 % (ref 11.5–15.5)
WBC: 5.6 10*3/uL (ref 4.0–10.5)

## 2014-08-17 LAB — LIPID PANEL
CHOLESTEROL: 217 mg/dL — AB (ref 0–200)
HDL: 50.6 mg/dL (ref >39.00)
NonHDL: 166.4
Total CHOL/HDL Ratio: 4
Triglycerides: 274 mg/dL — ABNORMAL HIGH (ref 0.0–149.0)
VLDL: 54.8 mg/dL — ABNORMAL HIGH (ref 0.0–40.0)

## 2014-08-17 LAB — HEMOGLOBIN A1C: Hgb A1c MFr Bld: 7 % — ABNORMAL HIGH (ref 4.6–6.5)

## 2014-08-17 LAB — LDL CHOLESTEROL, DIRECT: Direct LDL: 137 mg/dL

## 2014-08-17 LAB — TSH: TSH: 0.91 u[IU]/mL (ref 0.35–4.50)

## 2014-08-18 ENCOUNTER — Other Ambulatory Visit: Payer: Self-pay | Admitting: Family Medicine

## 2014-08-18 ENCOUNTER — Encounter: Payer: Self-pay | Admitting: Family Medicine

## 2014-08-18 LAB — PTH, INTACT AND CALCIUM
Calcium: 8.7 mg/dL (ref 8.4–10.5)
PTH: 21 pg/mL (ref 14–64)

## 2014-08-19 ENCOUNTER — Other Ambulatory Visit: Payer: Self-pay | Admitting: Family Medicine

## 2014-09-01 ENCOUNTER — Encounter: Payer: Self-pay | Admitting: Family Medicine

## 2014-09-06 ENCOUNTER — Encounter: Payer: Self-pay | Admitting: Dietician

## 2014-09-06 ENCOUNTER — Other Ambulatory Visit: Payer: Self-pay | Admitting: Family Medicine

## 2014-09-06 ENCOUNTER — Telehealth: Payer: Self-pay | Admitting: Family Medicine

## 2014-09-06 ENCOUNTER — Encounter: Payer: BLUE CROSS/BLUE SHIELD | Attending: Family Medicine | Admitting: Dietician

## 2014-09-06 VITALS — Ht 66.0 in | Wt 166.0 lb

## 2014-09-06 DIAGNOSIS — E119 Type 2 diabetes mellitus without complications: Secondary | ICD-10-CM | POA: Insufficient documentation

## 2014-09-06 DIAGNOSIS — Z6826 Body mass index (BMI) 26.0-26.9, adult: Secondary | ICD-10-CM | POA: Diagnosis not present

## 2014-09-06 DIAGNOSIS — E669 Obesity, unspecified: Secondary | ICD-10-CM | POA: Diagnosis not present

## 2014-09-06 DIAGNOSIS — Z713 Dietary counseling and surveillance: Secondary | ICD-10-CM | POA: Insufficient documentation

## 2014-09-06 DIAGNOSIS — E1143 Type 2 diabetes mellitus with diabetic autonomic (poly)neuropathy: Secondary | ICD-10-CM | POA: Insufficient documentation

## 2014-09-06 DIAGNOSIS — E1169 Type 2 diabetes mellitus with other specified complication: Secondary | ICD-10-CM

## 2014-09-06 DIAGNOSIS — K3184 Gastroparesis: Secondary | ICD-10-CM

## 2014-09-06 MED ORDER — GLIMEPIRIDE 4 MG PO TABS: 4.0000 mg | ORAL_TABLET | Freq: Two times a day (BID) | ORAL | Status: DC

## 2014-09-06 NOTE — Patient Instructions (Signed)
Small frequent meals and snacks. Aim for 2 carbohydrate choices at each meal and snack (30 grams carbs each) Be mindful of foods with a higher sugar content and consider alternatives such as 1/2 of a sandwich. Consider Boost Glucose Control or Glucerna if you are having problems eating. Be cautious with fat and fiber. Consider Arm Chair Exercises

## 2014-09-06 NOTE — Telephone Encounter (Signed)
She is out of meds because her refills were not sent to her mail order  She is out and will have to get them from a local pharmacy to have meds for tomorrow  Please call her

## 2014-09-06 NOTE — Telephone Encounter (Signed)
Pt requesting a refill glimepiride (AMARYL) 4 MG tablet

## 2014-09-06 NOTE — Telephone Encounter (Signed)
Mail order refill done per mychart request.

## 2014-09-06 NOTE — Progress Notes (Signed)
Medical Nutrition Therapy:  Appt start time: 0830 end time:  0945.   Assessment:  Primary concerns today: Patient is here for nonalcoholic fatty liver disease.  Hx includes hyperlipidemia and diabetes.  HgbA1C7.0% 08/17/14 which is decreased. Increased LDL, Cholesterol, and Triglicerides.  AST/ALT 64/62. Patient is here alone.  She requested the visit.  She is unable to tolerate many foods due to increased gas and burning in the stomach.  She has been on protonix for approximately 10 years.  Does not tolerate high fat foods (sit very heavy then cause diarrhea within 30 minutes.  Difficulty tolerating meat unless cut fine or ground.  Does tolerate bologna and deli [place].  No longer tolerates soda secondary to increased carbonation, belching.  Beans and vegetables cause increased gas.  Unable to digest raw veges (just sit there and wishes she could vomit).  Tolerates bread, fruit, and dairy.  Does not eat whole grains as she does not digest them well.    Weight has been stable at about 166 lbs for the past 10 years. "I don't lose weight even though I don't eat some days."  Patient is concerned that if she starts insulin that she would gain 10-20 lbs. CBG every am 176 (7 day average).   Patient lives with husband.  She does the shopping and cooking.  Works at [employer].  Preferred Learning Style:   No preference indicated   Learning Readiness:   Not ready  Contemplating  Ready  Change in progress   MEDICATIONS: see list which includes amaryl and janumet.  She also takes a probiotic which patient finds helpful to regulate bowels.  Patient has diarrhea about 4 times daily.   DIETARY INTAKE: 24-hr recall:  B (8 AM):  White toast with peanut butter and 2% milk  Snk ( AM): sometimes nabs or brownies or swiss miss cake  L ( PM): cottage cheese and applesauce or chicken salad and ritz crackers, regular jello Snk ( PM): sometimes (same as am snack) D (6-6:30 PM): spaghetti and  meatballs, pork chops, mashed potatoes and green beens Snk ( PM): none Beverages: water, occasional half and half tea  Usual physical activity: none, if stands or walks too much feet hurt  Estimated energy needs: 1400 calories 158 g carbohydrates 105 g protein 39 g fat  Progress Towards Goal(s):  In progress.   Nutritional Diagnosis:  NB-1.1 Food and nutrition-related knowledge deficit As related to eating and gastroparesis.  As evidenced by patient report.    Intervention:  Nutrition education and counseling.  Reviewed carbohydrates and portion sizes as well as preferred carbohydrate choices.  Discussed guidelines for treatment of gastroparesis.  Patient did not tolerate reglan.. Discussed metabolism in regards to weight management and the importance of regular meals.  Eating carbs in liquid form if unable to tolerate solids on some days.    Small frequent meals and snacks. Aim for 2 carbohydrate choices at each meal and snack (30 grams carbs each) Be mindful of foods with a higher sugar content and consider alternatives such as 1/2 of a sandwich. Consider Boost Glucose Control or Glucerna if you are having problems eating. Be cautious with fat and fiber. Consider Arm Chair Exercises  Teaching Method Utilized:  Visual Auditory Hands on  Handouts given during visit include:  Nutrition therapy for gastroparesis  Meal plan card  Snack list  Barriers to learning/adherence to lifestyle change: gastroparesis  Demonstrated degree of understanding via:  Teach Back   Monitoring/Evaluation:  Dietary intake,  exercise, label reading, and body weight prn.

## 2014-09-06 NOTE — Telephone Encounter (Signed)
Called left message to call back 

## 2014-09-06 NOTE — Telephone Encounter (Signed)
Refill done to local pharmacy.

## 2014-09-12 ENCOUNTER — Encounter: Payer: Self-pay | Admitting: Family Medicine

## 2014-09-12 ENCOUNTER — Ambulatory Visit: Payer: BLUE CROSS/BLUE SHIELD | Admitting: Internal Medicine

## 2014-09-12 ENCOUNTER — Other Ambulatory Visit: Payer: Self-pay | Admitting: Family Medicine

## 2014-09-12 DIAGNOSIS — E1169 Type 2 diabetes mellitus with other specified complication: Secondary | ICD-10-CM

## 2014-09-12 DIAGNOSIS — E669 Obesity, unspecified: Principal | ICD-10-CM

## 2014-09-16 ENCOUNTER — Encounter: Payer: Self-pay | Admitting: Medical

## 2014-09-16 ENCOUNTER — Ambulatory Visit (INDEPENDENT_AMBULATORY_CARE_PROVIDER_SITE_OTHER): Payer: BLUE CROSS/BLUE SHIELD | Admitting: Medical

## 2014-09-16 VITALS — BP 130/83 | HR 106 | Temp 98.1°F | Ht 66.0 in | Wt 165.6 lb

## 2014-09-16 DIAGNOSIS — J301 Allergic rhinitis due to pollen: Secondary | ICD-10-CM

## 2014-09-16 DIAGNOSIS — J209 Acute bronchitis, unspecified: Secondary | ICD-10-CM | POA: Diagnosis not present

## 2014-09-16 MED ORDER — CEFDINIR 300 MG PO CAPS: 300.0000 mg | ORAL_CAPSULE | Freq: Two times a day (BID) | ORAL | Status: DC

## 2014-09-16 MED ORDER — HYDROCOD POLST-CPM POLST ER 10-8 MG/5ML PO SUER: 5.0000 mL | Freq: Two times a day (BID) | ORAL | Status: DC | PRN

## 2014-09-16 MED ORDER — MOMETASONE FUROATE 50 MCG/ACT NA SUSP: NASAL | Status: DC

## 2014-09-16 NOTE — Assessment & Plan Note (Signed)
Bronchitis with early lt om. Rx cefdnir and tussionex cough syrup. Please remember could use 2.5 ml of cough syrup if 5 ml too sedating.

## 2014-09-16 NOTE — Patient Instructions (Addendum)
Allergic rhinitis Recent symptoms. Rx nasonex nasal spray. If not covered then flonase otc. Continue allegra.   Acute bronchitis Bronchitis with early lt om. Rx cefdnir and tussionex cough syrup. Please remember could use 2.5 ml of cough syrup if 5 ml too sedating.    Follow up in 7 days any persisting symptom  or as needed  Advised pt that back pain sciatica should ease up. Hydrocodone for cough may give some relief. If this persists then would recommend separate visit for that. Hopefully self limiting only lasting 4-5 more days.

## 2014-09-16 NOTE — Progress Notes (Signed)
Subjective:    Patient ID: [name] [name], female    DOB: 12-Apr-1956, 59 y.o.   MRN: [id number]  HPI  Pt has nasal congestion and cough 1 wk or more. Cough is productive. Runny nose. Sneezing occasionaly. Allergies year round on allegra. No nasal spray use. No recent fever but some on Saturday.   Hx of asthma but no recent wheezing.Pt has inhaler if needed.  Also last week coughin episode bent over and felt pain in rt si area. Still hurts    Review of Systems  Constitutional: Negative for fever, chills and fatigue.  HENT: Positive for congestion, postnasal drip, rhinorrhea and sneezing. Negative for ear pain and sinus pressure.   Respiratory: Positive for cough. Negative for chest tightness, shortness of breath and wheezing.        Productive cough.  Cardiovascular: Negative for chest pain and palpitations.  Gastrointestinal: Negative for nausea, vomiting, abdominal pain, diarrhea and abdominal distention.       Thursday, Friday and Saturday loose stools but now resolved.  Neurological: Negative for dizziness, facial asymmetry, speech difficulty and weakness.  Hematological: Negative for adenopathy. Does not bruise/bleed easily.  Psychiatric/Behavioral: Negative for behavioral problems and confusion.   Past Medical History  Diagnosis Date  . Hypothyroidism   . Diabetes mellitus type II   . Fatty liver     abnormal transaminases; negative work up  . Atypical chest pain     normal coronaries and LV function by cath 10/2011  . GERD (gastroesophageal reflux disease)   . Osteoarthritis     chronic, right knee  . Plantar fasciitis   . Chronic pain disorder     neck and shoulder  . HTN (hypertension)   . HLD (hyperlipidemia)   . Asthma   . Hip pain, bilateral 07/19/2012  . Other malaise and fatigue 01/27/2013  . CTS (carpal tunnel syndrome) 01/27/2013    right  . Hypokalemia 05/25/2013    Improved stopping HCTZ. Was noted to have an elevated glucose when K was low. Recheck renal  next week after starting Maxzide  . Esophageal reflux 08/25/2013    [facility], Dr Barth Kirks  . Fundic gland polyps of stomach, benign   . Nausea without vomiting 08/08/2014  . Hypomagnesemia 08/10/2014  . Anterior neck pain 08/10/2014  . Abdominal pain 08/10/2014  . Diabetic gastroparesis     History   Social History  . Marital Status: Married    Spouse Name: N/A  . Number of Children: 1  . Years of Education: N/A   Occupational History  . IT TECH, admin. assistant    Social History Main Topics  . Smoking status: Never Smoker   . Smokeless tobacco: Never Used  . Alcohol Use: No  . Drug Use: No  . Sexual Activity: No   Other Topics Concern  . Not on file   Social History Narrative   Multiple family members with intolerance to statins.    Past Surgical History  Procedure Laterality Date  . Cholecystectomy    . Tonsillectomy  1975  . Knee arthroscopy  1998, 2007  . Vesicovaginal fistula closure w/ tah  1995  . Abdominal hysterectomy  1982  . Left heart catheterization with coronary angiogram N/A 11/21/2011    Procedure: LEFT HEART CATHETERIZATION WITH CORONARY ANGIOGRAM;  Surgeon: Josue Hector, MD;  Location: [facility] CATH LAB;  Service: Cardiovascular;  Laterality: N/A;  . Colonoscopy    . Esophagogastroduodenoscopy    . Abdominal hysterectomy  Family History  Problem Relation Age of Onset  . Alzheimer's disease Mother   . Diabetes type II Mother   . Hiatal hernia Mother   . Diabetes Mother   . Emphysema Father   . Cancer Father     leukemia  . Colon cancer Neg Hx   . Depression Sister     suicide  . Diabetes Sister     Allergies  Allergen Reactions  . Epinephrine Other (See Comments)    Pass out   . Simvastatin     REACTION: back ache  . Statins     myalgias  . Welchol [Colesevelam Hcl]     myalgia    Current Outpatient Prescriptions on File Prior to Visit  Medication Sig Dispense Refill  . albuterol (PROVENTIL HFA;VENTOLIN HFA) 108 (90  BASE) MCG/ACT inhaler Inhale 2 puffs into the lungs every 6 (six) hours as needed for wheezing or shortness of breath. 1 Inhaler 0  . Alcohol Swabs (ALCOHOL PADS) 70 % PADS Use prior to checking blood sugars to clean skin    . aspirin 81 MG tablet Take 81 mg by mouth daily.      . Fluticasone-Salmeterol (ADVAIR DISKUS) 250-50 MCG/DOSE AEPB INHALE 1 PUFF INTO LUNGS 2 TIMES DAILY 3 each 3  . FORA V12 BLOOD GLUCOSE TEST test strip     . glimepiride (AMARYL) 4 MG tablet Take 1 tablet (4 mg total) by mouth 2 (two) times daily. 180 tablet 1  . Glucosamine-Chondroitin (OSTEO BI-FLEX REGULAR STRENGTH) 250-200 MG TABS Take 1 tablet by mouth 2 (two) times daily.     . Lite Touch Lancets MISC     . Magnesium 200 MG TABS Take 2 tablets (400 mg total) by mouth daily. 60 each 1  . Multiple Vitamin (MULTIVITAMIN) tablet Take 1 tablet by mouth daily.    . niacin (NIASPAN) 500 MG CR tablet 1 tab po qhs with lowfat snack and Aspirin 1/2 hour prior  Failed all statins and welchol 30 tablet 5  . pantoprazole (PROTONIX) 40 MG tablet TAKE 1 TABLET DAILY 90 tablet 1  . sitaGLIPtin-metformin (JANUMET) 50-1000 MG per tablet Take 1 tablet by mouth 2 (two) times daily with a meal. 180 tablet 1  . SYNTHROID 88 MCG tablet TAKE 1 TABLET DAILY 90 tablet 1  . triamterene-hydrochlorothiazide (MAXZIDE-25) 37.5-25 MG per tablet TAKE 1 TABLET DAILY 90 tablet 1   No current facility-administered medications on file prior to visit.    BP 130/83 mmHg  Pulse 106  Temp(Src) 98.1 F (36.7 C) (Oral)  Ht 5\' 6"  (1.676 m)  Wt 165 lb 9.6 oz (75.116 kg)  BMI 26.74 kg/m2  SpO2 99%      Objective:   Physical Exam  General  Mental Status - Alert. General Appearance - Well groomed. Not in acute distress.  Skin Rashes- No Rashes.  HEENT Head- Normal. Ear Auditory Canal - Left- Normal. Right - Normal.Tympanic Membrane- Left- dull . Right- Normal. Eye Sclera/Conjunctiva- Left- Normal. Right- Normal. Nose & Sinuses Nasal  Mucosa- Left-  Boggy and Congested. Right-  Boggy and  Congested No .Bilateral maxillary or frontal sinus pressure. Mouth & Throat Lips: Upper Lip- Normal: no dryness, cracking, pallor, cyanosis, or vesicular eruption. Lower Lip-Normal: no dryness, cracking, pallor, cyanosis or vesicular eruption. Buccal Mucosa- Bilateral- No Aphthous ulcers. Oropharynx- No Discharge or Erythema. Tonsils: Characteristics- Bilateral- No Erythema or Congestion. Size/Enlargement- Bilateral- No enlargement. Discharge- bilateral-None.  Neck Neck- Supple. No Masses.   Chest and Lung Exam Auscultation: Breath  Sounds:-Clear even and unlabored.  Cardiovascular Auscultation:Rythm- Regular, rate and rhythm. Murmurs & Other Heart Sounds:Ausculatation of the heart reveal- No Murmurs.  Lymphatic Head & Neck General Head & Neck Lymphatics: Bilateral: Description- No Localized lymphadenopathy.  Back - no mid lumbar pain. Mild rt si tenderness to palpation.      Assessment & Plan:

## 2014-09-16 NOTE — Progress Notes (Signed)
Pre visit review using our clinic review tool, if applicable. No additional management support is needed unless otherwise documented below in the visit note. 

## 2014-09-16 NOTE — Assessment & Plan Note (Signed)
Recent symptoms. Rx nasonex nasal spray. If not covered then flonase otc. Continue allegra.

## 2014-09-28 ENCOUNTER — Ambulatory Visit (INDEPENDENT_AMBULATORY_CARE_PROVIDER_SITE_OTHER): Payer: BLUE CROSS/BLUE SHIELD | Admitting: Endocrinology

## 2014-09-28 ENCOUNTER — Encounter: Payer: Self-pay | Admitting: Endocrinology

## 2014-09-28 VITALS — BP 134/84 | HR 105 | Temp 98.3°F

## 2014-09-28 DIAGNOSIS — E1169 Type 2 diabetes mellitus with other specified complication: Secondary | ICD-10-CM

## 2014-09-28 DIAGNOSIS — E669 Obesity, unspecified: Secondary | ICD-10-CM

## 2014-09-28 DIAGNOSIS — E119 Type 2 diabetes mellitus without complications: Secondary | ICD-10-CM | POA: Diagnosis not present

## 2014-09-28 MED ORDER — DAPAGLIFLOZIN PROPANEDIOL 10 MG PO TABS: 10.0000 mg | ORAL_TABLET | Freq: Every day | ORAL | Status: DC

## 2014-09-28 MED ORDER — TRIAMTERENE-HCTZ 37.5-25 MG PO TABS: 0.5000 | ORAL_TABLET | Freq: Every day | ORAL | Status: DC

## 2014-09-28 MED ORDER — LINAGLIPTIN-METFORMIN HCL 2.5-1000 MG PO TABS
1.0000 | ORAL_TABLET | Freq: Two times a day (BID) | ORAL | Status: DC
Start: 2014-09-28 — End: 2014-11-29

## 2014-09-28 MED ORDER — GLIMEPIRIDE 2 MG PO TABS: 2.0000 mg | ORAL_TABLET | Freq: Every day | ORAL | Status: DC

## 2014-09-28 NOTE — Progress Notes (Signed)
Subjective:    Patient ID: [name] [name], female    DOB: [birth year], 59 y.o.   MRN: [id number]  HPI pt states DM was dx'ed in 2006; he has mild if any neuropathy of the lower extremities; she has associated gastgroparesis; she has never been on insulin; pt says her diet and exercise are poor; he has never had GDM, pancreatitis, severe hypoglycemia or DKA.  She checks cbg in am only.  It varies from 176-245.   Past Medical History  Diagnosis Date  . Hypothyroidism   . Diabetes mellitus type II   . Fatty liver     abnormal transaminases; negative work up  . Atypical chest pain     normal coronaries and LV function by cath 10/2011  . GERD (gastroesophageal reflux disease)   . Osteoarthritis     chronic, right knee  . Plantar fasciitis   . Chronic pain disorder     neck and shoulder  . HTN (hypertension)   . HLD (hyperlipidemia)   . Asthma   . Hip pain, bilateral 07/19/2012  . Other malaise and fatigue 01/27/2013  . CTS (carpal tunnel syndrome) 01/27/2013    right  . Hypokalemia 05/25/2013    Improved stopping HCTZ. Was noted to have an elevated glucose when K was low. Recheck renal next week after starting Maxzide  . Esophageal reflux 08/25/2013    [place], Dr Barth Kirks  . Fundic gland polyps of stomach, benign   . Nausea without vomiting 08/08/2014  . Hypomagnesemia 08/10/2014  . Anterior neck pain 08/10/2014  . Abdominal pain 08/10/2014  . Diabetic gastroparesis     Past Surgical History  Procedure Laterality Date  . Cholecystectomy    . Tonsillectomy  1975  . Knee arthroscopy  1998, 2007  . Vesicovaginal fistula closure w/ tah  1995  . Abdominal hysterectomy  1982  . Left heart catheterization with coronary angiogram N/A 11/21/2011    Procedure: LEFT HEART CATHETERIZATION WITH CORONARY ANGIOGRAM;  Surgeon: Josue Hector, MD;  Location: [facility] CATH LAB;  Service: Cardiovascular;  Laterality: N/A;  . Colonoscopy    . Esophagogastroduodenoscopy    . Abdominal hysterectomy       History   Social History  . Marital Status: Married    Spouse Name: N/A  . Number of Children: 1  . Years of Education: N/A   Occupational History  . IT TECH, admin. assistant    Social History Main Topics  . Smoking status: Never Smoker   . Smokeless tobacco: Never Used  . Alcohol Use: No  . Drug Use: No  . Sexual Activity: No   Other Topics Concern  . Not on file   Social History Narrative   Multiple family members with intolerance to statins.    Current Outpatient Prescriptions on File Prior to Visit  Medication Sig Dispense Refill  . albuterol (PROVENTIL HFA;VENTOLIN HFA) 108 (90 BASE) MCG/ACT inhaler Inhale 2 puffs into the lungs every 6 (six) hours as needed for wheezing or shortness of breath. 1 Inhaler 0  . Alcohol Swabs (ALCOHOL PADS) 70 % PADS Use prior to checking blood sugars to clean skin    . aspirin 81 MG tablet Take 81 mg by mouth daily.      . Fluticasone-Salmeterol (ADVAIR DISKUS) 250-50 MCG/DOSE AEPB INHALE 1 PUFF INTO LUNGS 2 TIMES DAILY 3 each 3  . FORA V12 BLOOD GLUCOSE TEST test strip     . Glucosamine-Chondroitin (OSTEO BI-FLEX REGULAR STRENGTH) 250-200 MG TABS Take  1 tablet by mouth 2 (two) times daily.     . Lite Touch Lancets MISC     . Magnesium 200 MG TABS Take 2 tablets (400 mg total) by mouth daily. 60 each 1  . mometasone (NASONEX) 50 MCG/ACT nasal spray 2 sprays each nares q day 17 g 2  . Multiple Vitamin (MULTIVITAMIN) tablet Take 1 tablet by mouth daily.    . niacin (NIASPAN) 500 MG CR tablet 1 tab po qhs with lowfat snack and Aspirin 1/2 hour prior  Failed all statins and welchol 30 tablet 5  . pantoprazole (PROTONIX) 40 MG tablet TAKE 1 TABLET DAILY 90 tablet 1  . sitaGLIPtin-metformin (JANUMET) 50-1000 MG per tablet Take 1 tablet by mouth 2 (two) times daily with a meal. 180 tablet 1  . SYNTHROID 88 MCG tablet TAKE 1 TABLET DAILY 90 tablet 1   No current facility-administered medications on file prior to visit.    Allergies    Allergen Reactions  . Epinephrine Other (See Comments)    Pass out   . Simvastatin     REACTION: back ache  . Statins     myalgias  . Welchol [Colesevelam Hcl]     myalgia    Family History  Problem Relation Age of Onset  . Alzheimer's disease Mother   . Diabetes type II Mother   . Hiatal hernia Mother   . Diabetes Mother   . Emphysema Father   . Cancer Father     leukemia  . Colon cancer Neg Hx   . Depression Sister     suicide  . Diabetes Sister     BP 134/84 mmHg  Pulse 105  Temp(Src) 98.3 F (36.8 C) (Oral)  SpO2 96%   Review of Systems denies weight loss, blurry vision, headache, chest pain, urinary frequency, muscle cramps, excessive diaphoresis, depression, cold intolerance, rhinorrhea, and easy bruising.  she has fatigue.  She reports frequent URI's.  She has intermittent sob due to asthma, and intermittent nausea.      Objective:   Physical Exam VITAL SIGNS:  See vs page. GENERAL: no distress. Pulses: dorsalis pedis intact bilat.   MSK: no deformity of the feet. CV: no leg edema. Skin:  no ulcer on the feet.  normal color and temp on the feet. Neuro: sensation is intact to touch on the feet.    Lab Results  Component Value Date   HGBA1C 7.0* 08/17/2014       Assessment & Plan:  DM: good glycemic control, but she should minimize SU if possible Obesity: new to me  Patient is advised the following: Patient Instructions  good diet and exercise significantly improve the control of your diabetes.  please let me know if you wish to be referred to a dietician.  high blood sugar is very risky to your health.  you should see an eye doctor and dentist every year.  It is very important to get all recommended vaccinations.  controlling your blood pressure and cholesterol drastically reduces the damage diabetes does to your body.  Those who smoke should quit.  please discuss these with your doctor.  check your blood sugar once a day.  vary the time of day  when you check, between before the 3 meals, and at bedtime.  also check if you have symptoms of your blood sugar being too high or too low.  please keep a record of the readings and bring it to your next appointment here.  You can write it  on any piece of paper.  please call us sooner if your blood sugar goes below 70, or if you have a lot of readings over 200.  we will need to take this complex situation in stages We can do some of the adjustments by phone. For now, please add farxiga, change janumet to jentadueto, and: reduce the glimeperide to 2 mg daily, and: Reduce the maxzide to 1/2 tab daily. i have sent prescriptions to your pharmacy.   Please come back for a follow-up appointment in 2 months.  Please call sooner if the blood sugar goes high, so we can add "bromocriptine."  Please consider having weight loss surgery.  It is good for your health, and can help the gastroparesis also.  Here is some information about it.  If you decide to consider further, please call the phone number in the papers, and register for a free informational meeting.

## 2014-09-28 NOTE — Patient Instructions (Addendum)
good diet and exercise significantly improve the control of your diabetes.  please let me know if you wish to be referred to a dietician.  high blood sugar is very risky to your health.  you should see an eye doctor and dentist every year.  It is very important to get all recommended vaccinations.  controlling your blood pressure and cholesterol drastically reduces the damage diabetes does to your body.  Those who smoke should quit.  please discuss these with your doctor.  check your blood sugar once a day.  vary the time of day when you check, between before the 3 meals, and at bedtime.  also check if you have symptoms of your blood sugar being too high or too low.  please keep a record of the readings and bring it to your next appointment here.  You can write it on any piece of paper.  please call us sooner if your blood sugar goes below 70, or if you have a lot of readings over 200.  we will need to take this complex situation in stages We can do some of the adjustments by phone. For now, please add farxiga, change janumet to jentadueto, and: reduce the glimeperide to 2 mg daily, and: Reduce the maxzide to 1/2 tab daily. i have sent prescriptions to your pharmacy.   Please come back for a follow-up appointment in 2 months.  Please call sooner if the blood sugar goes high, so we can add "bromocriptine."  Please consider having weight loss surgery.  It is good for your health, and can help the gastroparesis also.  Here is some information about it.  If you decide to consider further, please call the phone number in the papers, and register for a free informational meeting.

## 2014-10-19 ENCOUNTER — Other Ambulatory Visit: Payer: Self-pay | Admitting: Family Medicine

## 2014-10-20 LAB — HM DIABETES EYE EXAM

## 2014-10-26 ENCOUNTER — Other Ambulatory Visit: Payer: Self-pay

## 2014-10-26 MED ORDER — GLIMEPIRIDE 2 MG PO TABS: 2.0000 mg | ORAL_TABLET | Freq: Every day | ORAL | Status: DC

## 2014-10-27 ENCOUNTER — Encounter: Payer: Self-pay | Admitting: Family Medicine

## 2014-10-28 ENCOUNTER — Telehealth: Payer: Self-pay | Admitting: Family Medicine

## 2014-10-28 DIAGNOSIS — E785 Hyperlipidemia, unspecified: Secondary | ICD-10-CM

## 2014-10-28 DIAGNOSIS — E119 Type 2 diabetes mellitus without complications: Secondary | ICD-10-CM

## 2014-10-28 DIAGNOSIS — R790 Abnormal level of blood mineral: Secondary | ICD-10-CM

## 2014-10-28 NOTE — Telephone Encounter (Signed)
Labs ordered at the [facility] lab for appt. With PCP November 07, 2014.

## 2014-11-01 ENCOUNTER — Other Ambulatory Visit (INDEPENDENT_AMBULATORY_CARE_PROVIDER_SITE_OTHER): Payer: BLUE CROSS/BLUE SHIELD

## 2014-11-01 DIAGNOSIS — E119 Type 2 diabetes mellitus without complications: Secondary | ICD-10-CM

## 2014-11-01 DIAGNOSIS — R790 Abnormal level of blood mineral: Secondary | ICD-10-CM

## 2014-11-01 DIAGNOSIS — E785 Hyperlipidemia, unspecified: Secondary | ICD-10-CM

## 2014-11-01 LAB — CBC WITH DIFFERENTIAL/PLATELET
Basophils Absolute: 0 10*3/uL (ref 0.0–0.1)
Basophils Relative: 0.6 % (ref 0.0–3.0)
Eosinophils Absolute: 0.2 10*3/uL (ref 0.0–0.7)
Eosinophils Relative: 3 % (ref 0.0–5.0)
HEMATOCRIT: 37.1 % (ref 36.0–46.0)
Hemoglobin: 12.5 g/dL (ref 12.0–15.0)
LYMPHS PCT: 26.7 % (ref 12.0–46.0)
Lymphs Abs: 1.9 10*3/uL (ref 0.7–4.0)
MCHC: 33.7 g/dL (ref 30.0–36.0)
MCV: 87.4 fl (ref 78.0–100.0)
MONO ABS: 0.4 10*3/uL (ref 0.1–1.0)
MONOS PCT: 6.3 % (ref 3.0–12.0)
Neutro Abs: 4.4 10*3/uL (ref 1.4–7.7)
Neutrophils Relative %: 63.4 % (ref 43.0–77.0)
Platelets: 216 10*3/uL (ref 150.0–400.0)
RBC: 4.25 Mil/uL (ref 3.87–5.11)
RDW: 13.5 % (ref 11.5–15.5)
WBC: 6.9 10*3/uL (ref 4.0–10.5)

## 2014-11-01 LAB — LIPID PANEL
CHOLESTEROL: 206 mg/dL — AB (ref 0–200)
HDL: 46.1 mg/dL (ref >39.00)
NONHDL: 159.9
Total CHOL/HDL Ratio: 4
Triglycerides: 242 mg/dL — ABNORMAL HIGH (ref 0.0–149.0)
VLDL: 48.4 mg/dL — AB (ref 0.0–40.0)

## 2014-11-01 LAB — LDL CHOLESTEROL, DIRECT: Direct LDL: 124 mg/dL

## 2014-11-01 LAB — COMPREHENSIVE METABOLIC PANEL
ALBUMIN: 4.5 g/dL (ref 3.5–5.2)
ALT: 71 U/L — AB (ref 0–35)
AST: 63 U/L — ABNORMAL HIGH (ref 0–37)
Alkaline Phosphatase: 61 U/L (ref 39–117)
BUN: 18 mg/dL (ref 6–23)
CALCIUM: 9.3 mg/dL (ref 8.4–10.5)
CHLORIDE: 98 meq/L (ref 96–112)
CO2: 28 mEq/L (ref 19–32)
Creatinine, Ser: 1.27 mg/dL — ABNORMAL HIGH (ref 0.40–1.20)
GFR: 45.61 mL/min — ABNORMAL LOW (ref >60.00)
Glucose, Bld: 153 mg/dL — ABNORMAL HIGH (ref 70–99)
Potassium: 3.5 mEq/L (ref 3.5–5.1)
SODIUM: 138 meq/L (ref 135–145)
Total Bilirubin: 0.6 mg/dL (ref 0.2–1.2)
Total Protein: 7.5 g/dL (ref 6.0–8.3)

## 2014-11-01 LAB — HEMOGLOBIN A1C: Hgb A1c MFr Bld: 6.4 % (ref 4.6–6.5)

## 2014-11-01 LAB — MAGNESIUM: Magnesium: 1.2 mg/dL — ABNORMAL LOW (ref 1.5–2.5)

## 2014-11-01 LAB — TSH: TSH: 1.7 u[IU]/mL (ref 0.35–4.50)

## 2014-11-07 ENCOUNTER — Encounter: Payer: Self-pay | Admitting: Family Medicine

## 2014-11-07 ENCOUNTER — Ambulatory Visit (INDEPENDENT_AMBULATORY_CARE_PROVIDER_SITE_OTHER): Payer: BLUE CROSS/BLUE SHIELD | Admitting: Family Medicine

## 2014-11-07 VITALS — BP 120/70 | HR 96 | Temp 98.0°F | Ht 66.0 in | Wt 164.2 lb

## 2014-11-07 DIAGNOSIS — E782 Mixed hyperlipidemia: Secondary | ICD-10-CM

## 2014-11-07 DIAGNOSIS — E119 Type 2 diabetes mellitus without complications: Secondary | ICD-10-CM | POA: Diagnosis not present

## 2014-11-07 DIAGNOSIS — K7689 Other specified diseases of liver: Secondary | ICD-10-CM

## 2014-11-07 DIAGNOSIS — I10 Essential (primary) hypertension: Secondary | ICD-10-CM

## 2014-11-07 DIAGNOSIS — R945 Abnormal results of liver function studies: Secondary | ICD-10-CM

## 2014-11-07 DIAGNOSIS — E669 Obesity, unspecified: Secondary | ICD-10-CM

## 2014-11-07 DIAGNOSIS — K219 Gastro-esophageal reflux disease without esophagitis: Secondary | ICD-10-CM

## 2014-11-07 DIAGNOSIS — E039 Hypothyroidism, unspecified: Secondary | ICD-10-CM

## 2014-11-07 DIAGNOSIS — E1169 Type 2 diabetes mellitus with other specified complication: Secondary | ICD-10-CM

## 2014-11-07 DIAGNOSIS — N289 Disorder of kidney and ureter, unspecified: Secondary | ICD-10-CM | POA: Diagnosis not present

## 2014-11-07 MED ORDER — MAGNESIUM OXIDE 400 (241.3 MG) MG PO TABS: 1.0000 | ORAL_TABLET | Freq: Every day | ORAL | Status: DC

## 2014-11-07 NOTE — Patient Instructions (Signed)

## 2014-11-07 NOTE — Progress Notes (Signed)
[name] [name]  [id number] 03-Nov-1955 11/07/2014      Progress Note-Follow Up  Subjective  Chief Complaint  Chief Complaint  Patient presents with  . Follow-up    3 month    HPI  Patient is a 59 y.o. female in today for routine medical care. Patient is in today for follow-up. Feeling well. No recent illness. Denies polyuria and polydipsia. Is following with endocrinology now and is doing very well on [place]. No acute concerns noted at today's visit. Denies CP/palp/SOB/HA/congestion/fevers/GI or GU c/o. Taking meds as prescribed  Past Medical History  Diagnosis Date  . Hypothyroidism   . Diabetes mellitus type II   . Fatty liver     abnormal transaminases; negative work up  . Atypical chest pain     normal coronaries and LV function by cath 10/2011  . GERD (gastroesophageal reflux disease)   . Osteoarthritis     chronic, right knee  . Plantar fasciitis   . Chronic pain disorder     neck and shoulder  . HTN (hypertension)   . HLD (hyperlipidemia)   . Asthma   . Hip pain, bilateral 07/19/2012  . Other malaise and fatigue 01/27/2013  . CTS (carpal tunnel syndrome) 01/27/2013    right  . Hypokalemia 05/25/2013    Improved stopping HCTZ. Was noted to have an elevated glucose when K was low. Recheck renal next week after starting Maxzide  . Esophageal reflux 08/25/2013    [facility] [place], Dr Barth Kirks  . Fundic gland polyps of stomach, benign   . Nausea without vomiting 08/08/2014  . Hypomagnesemia 08/10/2014  . Anterior neck pain 08/10/2014  . Abdominal pain 08/10/2014  . Diabetic gastroparesis     Past Surgical History  Procedure Laterality Date  . Cholecystectomy    . Tonsillectomy  1975  . Knee arthroscopy  1998, 2007  . Vesicovaginal fistula closure w/ tah  1995  . Abdominal hysterectomy  1982  . Left heart catheterization with coronary angiogram N/A 11/21/2011    Procedure: LEFT HEART CATHETERIZATION WITH CORONARY ANGIOGRAM;  Surgeon: Josue Hector,  MD;  Location: [facility] CATH LAB;  Service: Cardiovascular;  Laterality: N/A;  . Colonoscopy    . Esophagogastroduodenoscopy    . Abdominal hysterectomy      Family History  Problem Relation Age of Onset  . Alzheimer's disease Mother   . Diabetes type II Mother   . Hiatal hernia Mother   . Diabetes Mother   . Emphysema Father   . Cancer Father     leukemia  . Colon cancer Neg Hx   . Depression Sister     suicide  . Diabetes Sister     History   Social History  . Marital Status: Married    Spouse Name: N/A  . Number of Children: 1  . Years of Education: N/A   Occupational History  . IT TECH, admin. assistant    Social History Main Topics  . Smoking status: Never Smoker   . Smokeless tobacco: Never Used  . Alcohol Use: No  . Drug Use: No  . Sexual Activity: No   Other Topics Concern  . Not on file   Social History Narrative   Multiple family members with intolerance to statins.    Current Outpatient Prescriptions on File Prior to Visit  Medication Sig Dispense Refill  . albuterol (PROVENTIL HFA;VENTOLIN HFA) 108 (90 BASE) MCG/ACT inhaler Inhale 2 puffs into the lungs every 6 (six) hours as needed for  wheezing or shortness of breath. 1 Inhaler 0  . Alcohol Swabs (ALCOHOL PADS) 70 % PADS Use prior to checking blood sugars to clean skin    . aspirin 81 MG tablet Take 81 mg by mouth daily.      . dapagliflozin propanediol (FARXIGA) 10 MG TABS tablet Take 10 mg by mouth daily. 90 tablet 3  . fluticasone (FLONASE) 50 MCG/ACT nasal spray Place into both nostrils daily.    . Fluticasone-Salmeterol (ADVAIR DISKUS) 250-50 MCG/DOSE AEPB INHALE 1 PUFF INTO LUNGS 2 TIMES DAILY 3 each 3  . FORA V12 BLOOD GLUCOSE TEST test strip     . glimepiride (AMARYL) 2 MG tablet Take 1 tablet (2 mg total) by mouth daily before breakfast. 90 tablet 2  . Glucosamine-Chondroitin (OSTEO BI-FLEX REGULAR STRENGTH) 250-200 MG TABS Take 1 tablet by mouth 2 (two) times daily.     . Linagliptin-Metformin  HCl (JENTADUETO) 2.09-998 MG TABS Take 1 tablet by mouth 2 (two) times daily. 180 tablet 11  . Lite Touch Lancets MISC     . mometasone (NASONEX) 50 MCG/ACT nasal spray 2 sprays each nares q day 17 g 2  . Multiple Vitamin (MULTIVITAMIN) tablet Take 1 tablet by mouth daily.    . niacin (NIASPAN) 500 MG CR tablet 1 tab po qhs with lowfat snack and Aspirin 1/2 hour prior  Failed all statins and welchol 30 tablet 5  . pantoprazole (PROTONIX) 40 MG tablet TAKE 1 TABLET DAILY 90 tablet 1  . SYNTHROID 88 MCG tablet TAKE 1 TABLET DAILY 90 tablet 1  . triamterene-hydrochlorothiazide (MAXZIDE-25) 37.5-25 MG per tablet Take 0.5 tablets by mouth daily. 45 tablet 3   No current facility-administered medications on file prior to visit.    Allergies  Allergen Reactions  . Epinephrine Other (See Comments)    Pass out   . Simvastatin     REACTION: back ache  . Statins     myalgias  . Welchol [Colesevelam Hcl]     myalgia    Review of Systems  Review of Systems  Constitutional: Negative for fever and malaise/fatigue.  HENT: Negative for congestion.   Eyes: Negative for discharge.  Respiratory: Negative for shortness of breath.   Cardiovascular: Negative for chest pain, palpitations and leg swelling.  Gastrointestinal: Negative for nausea, abdominal pain and diarrhea.  Genitourinary: Negative for dysuria.  Musculoskeletal: Negative for falls.  Skin: Negative for rash.  Neurological: Negative for loss of consciousness and headaches.  Endo/Heme/Allergies: Negative for polydipsia.  Psychiatric/Behavioral: Negative for depression and suicidal ideas. The patient is not nervous/anxious and does not have insomnia.     Objective  BP 120/70 mmHg  Pulse 96  Temp(Src) 98 F (36.7 C) (Oral)  Ht 5\' 6"  (1.676 m)  Wt 164 lb 4 oz (74.503 kg)  BMI 26.52 kg/m2  SpO2 96%  Physical Exam  Physical Exam  Constitutional: She is oriented to person, place, and time and well-developed, well-nourished,  and in no distress. No distress.  HENT:  Head: Normocephalic and atraumatic.  Eyes: Conjunctivae are normal.  Neck: Neck supple. No thyromegaly present.  Cardiovascular: Normal rate, regular rhythm and normal heart sounds.   No murmur heard. Pulmonary/Chest: Effort normal and breath sounds normal. She has no wheezes.  Abdominal: She exhibits no distension and no mass.  Musculoskeletal: She exhibits no edema.  Lymphadenopathy:    She has no cervical adenopathy.  Neurological: She is alert and oriented to person, place, and time.  Skin: Skin is warm and dry.  No rash noted. She is not diaphoretic.  Psychiatric: Memory, affect and judgment normal.    Lab Results  Component Value Date   TSH 1.70 11/01/2014   Lab Results  Component Value Date   WBC 6.9 11/01/2014   HGB 12.5 11/01/2014   HCT 37.1 11/01/2014   MCV 87.4 11/01/2014   PLT 216.0 11/01/2014   Lab Results  Component Value Date   CREATININE 1.27* 11/01/2014   BUN 18 11/01/2014   NA 138 11/01/2014   K 3.5 11/01/2014   CL 98 11/01/2014   CO2 28 11/01/2014   Lab Results  Component Value Date   ALT 71* 11/01/2014   AST 63* 11/01/2014   ALKPHOS 61 11/01/2014   BILITOT 0.6 11/01/2014   Lab Results  Component Value Date   CHOL 206* 11/01/2014   Lab Results  Component Value Date   HDL 46.10 11/01/2014   Lab Results  Component Value Date   LDLCALC 136* 08/19/2013   Lab Results  Component Value Date   TRIG 242.0* 11/01/2014   Lab Results  Component Value Date   CHOLHDL 4 11/01/2014     Assessment & Plan   Hypertension Well controlled, no changes to meds. Encouraged heart healthy diet such as the DASH diet and exercise as tolerated.   Esophageal reflux Avoid offending foods, start probiotics. Do not eat large meals in late evening and consider raising head of bed.   Hypothyroidism On Levothyroxine, continue to monitor  Hyperlipidemia, mixed Does not tolerate statins, Encouraged heart healthy  diet, increase exercise, avoid trans fats, consider a krill oil cap daily  Diabetes mellitus type 2 in obese hgba1c acceptable, minimize simple carbs. Increase exercise as tolerated. Continue current meds

## 2014-11-07 NOTE — Progress Notes (Signed)
Pre visit review using our clinic review tool, if applicable. No additional management support is needed unless otherwise documented below in the visit note. 

## 2014-11-13 NOTE — Assessment & Plan Note (Signed)
hgba1c acceptable, minimize simple carbs. Increase exercise as tolerated. Continue current meds 

## 2014-11-13 NOTE — Assessment & Plan Note (Signed)
Avoid offending foods, start probiotics. Do not eat large meals in late evening and consider raising head of bed.  

## 2014-11-13 NOTE — Assessment & Plan Note (Signed)
Does not tolerate statins, Encouraged heart healthy diet, increase exercise, avoid trans fats, consider a krill oil cap daily

## 2014-11-13 NOTE — Assessment & Plan Note (Signed)
Well controlled, no changes to meds. Encouraged heart healthy diet such as the DASH diet and exercise as tolerated.  °

## 2014-11-13 NOTE — Assessment & Plan Note (Signed)
On Levothyroxine, continue to monitor 

## 2014-11-17 ENCOUNTER — Encounter: Payer: Self-pay | Admitting: Family Medicine

## 2014-11-17 ENCOUNTER — Other Ambulatory Visit: Payer: Self-pay

## 2014-11-17 MED ORDER — GLIMEPIRIDE 2 MG PO TABS: 2.0000 mg | ORAL_TABLET | Freq: Every day | ORAL | Status: DC

## 2014-11-21 ENCOUNTER — Other Ambulatory Visit: Payer: Self-pay

## 2014-11-25 ENCOUNTER — Encounter: Payer: Self-pay | Admitting: Family Medicine

## 2014-11-25 ENCOUNTER — Other Ambulatory Visit: Payer: Self-pay | Admitting: Family Medicine

## 2014-11-25 ENCOUNTER — Ambulatory Visit (INDEPENDENT_AMBULATORY_CARE_PROVIDER_SITE_OTHER): Payer: BLUE CROSS/BLUE SHIELD | Admitting: Family Medicine

## 2014-11-25 VITALS — BP 110/60 | HR 120 | Temp 98.3°F | Ht 66.0 in | Wt 163.1 lb

## 2014-11-25 DIAGNOSIS — R112 Nausea with vomiting, unspecified: Secondary | ICD-10-CM | POA: Diagnosis not present

## 2014-11-25 DIAGNOSIS — K3184 Gastroparesis: Secondary | ICD-10-CM

## 2014-11-25 DIAGNOSIS — R740 Nonspecific elevation of levels of transaminase and lactic acid dehydrogenase [LDH]: Secondary | ICD-10-CM | POA: Diagnosis not present

## 2014-11-25 DIAGNOSIS — E612 Magnesium deficiency: Secondary | ICD-10-CM

## 2014-11-25 DIAGNOSIS — R197 Diarrhea, unspecified: Secondary | ICD-10-CM | POA: Diagnosis not present

## 2014-11-25 DIAGNOSIS — R7402 Elevation of levels of lactic acid dehydrogenase (LDH): Secondary | ICD-10-CM

## 2014-11-25 DIAGNOSIS — R109 Unspecified abdominal pain: Secondary | ICD-10-CM | POA: Diagnosis not present

## 2014-11-25 DIAGNOSIS — E1143 Type 2 diabetes mellitus with diabetic autonomic (poly)neuropathy: Secondary | ICD-10-CM

## 2014-11-25 DIAGNOSIS — I10 Essential (primary) hypertension: Secondary | ICD-10-CM

## 2014-11-25 DIAGNOSIS — E039 Hypothyroidism, unspecified: Secondary | ICD-10-CM

## 2014-11-25 DIAGNOSIS — E782 Mixed hyperlipidemia: Secondary | ICD-10-CM

## 2014-11-25 DIAGNOSIS — E1169 Type 2 diabetes mellitus with other specified complication: Secondary | ICD-10-CM

## 2014-11-25 DIAGNOSIS — E119 Type 2 diabetes mellitus without complications: Secondary | ICD-10-CM

## 2014-11-25 DIAGNOSIS — E669 Obesity, unspecified: Secondary | ICD-10-CM

## 2014-11-25 LAB — CBC
HCT: 40.1 % (ref 36.0–46.0)
HEMOGLOBIN: 13.4 g/dL (ref 12.0–15.0)
MCHC: 33.5 g/dL (ref 30.0–36.0)
MCV: 87 fl (ref 78.0–100.0)
Platelets: 218 10*3/uL (ref 150.0–400.0)
RBC: 4.61 Mil/uL (ref 3.87–5.11)
RDW: 13.4 % (ref 11.5–15.5)
WBC: 6.9 10*3/uL (ref 4.0–10.5)

## 2014-11-25 LAB — COMPREHENSIVE METABOLIC PANEL
ALBUMIN: 4.3 g/dL (ref 3.5–5.2)
ALT: 55 U/L — AB (ref 0–35)
AST: 36 U/L (ref 0–37)
Alkaline Phosphatase: 61 U/L (ref 39–117)
BILIRUBIN TOTAL: 1 mg/dL (ref 0.2–1.2)
BUN: 20 mg/dL (ref 6–23)
CO2: 24 meq/L (ref 19–32)
Calcium: 9.5 mg/dL (ref 8.4–10.5)
Chloride: 95 mEq/L — ABNORMAL LOW (ref 96–112)
Creatinine, Ser: 1.32 mg/dL — ABNORMAL HIGH (ref 0.40–1.20)
GFR: 43.61 mL/min — AB (ref >60.00)
Glucose, Bld: 196 mg/dL — ABNORMAL HIGH (ref 70–99)
Potassium: 3.8 mEq/L (ref 3.5–5.1)
Sodium: 137 mEq/L (ref 135–145)
TOTAL PROTEIN: 7.7 g/dL (ref 6.0–8.3)

## 2014-11-25 LAB — SEDIMENTATION RATE: Sed Rate: 25 mm/hr — ABNORMAL HIGH (ref 0–22)

## 2014-11-25 LAB — MAGNESIUM: MAGNESIUM: 1.4 mg/dL — AB (ref 1.5–2.5)

## 2014-11-25 MED ORDER — PROMETHAZINE HCL 25 MG/ML IJ SOLN
25.0000 mg | Freq: Once | INTRAMUSCULAR | Status: AC
  Administered 2014-11-25: 25 mg via INTRAMUSCULAR

## 2014-11-25 MED ORDER — PROMETHAZINE HCL 25 MG PO TABS: 25.0000 mg | ORAL_TABLET | Freq: Three times a day (TID) | ORAL | Status: DC | PRN

## 2014-11-25 NOTE — Progress Notes (Signed)
Pre visit review using our clinic review tool, if applicable. No additional management support is needed unless otherwise documented below in the visit note. 

## 2014-11-25 NOTE — Patient Instructions (Signed)

## 2014-11-25 NOTE — Progress Notes (Signed)
[name] [name]  [id number] 28-Nov-1955 11/25/2014      Progress Note-Follow Up  Subjective  Chief Complaint  Chief Complaint  Patient presents with  . Nausea  . Extremity Weakness    HPI  Patient is a 59 y.o. female in today for routine medical care. Patient is in today complaining of 2 day history of nausea vomiting and diarrhea. She is struggling with weakness and malaise as well as myalgias. Her blood sugars have been ranging from 150-240. She is noting no fevers or chills. She has been drinking water and Gatorade eating very little. Denies CP/palp/SOB/HA/congestion/fevers or GU c/o. Taking meds as prescribed  Past Medical History  Diagnosis Date  . Hypothyroidism   . Diabetes mellitus type II   . Fatty liver     abnormal transaminases; negative work up  . Atypical chest pain     normal coronaries and LV function by cath 10/2011  . GERD (gastroesophageal reflux disease)   . Osteoarthritis     chronic, right knee  . Plantar fasciitis   . Chronic pain disorder     neck and shoulder  . HTN (hypertension)   . HLD (hyperlipidemia)   . Asthma   . Hip pain, bilateral 07/19/2012  . Other malaise and fatigue 01/27/2013  . CTS (carpal tunnel syndrome) 01/27/2013    right  . Hypokalemia 05/25/2013    Improved stopping HCTZ. Was noted to have an elevated glucose when K was low. Recheck renal next week after starting Maxzide  . Esophageal reflux 08/25/2013    [place], Dr Barth Kirks  . Fundic gland polyps of stomach, benign   . Nausea without vomiting 08/08/2014  . Hypomagnesemia 08/10/2014  . Anterior neck pain 08/10/2014  . Abdominal pain 08/10/2014  . Diabetic gastroparesis     Past Surgical History  Procedure Laterality Date  . Cholecystectomy    . Tonsillectomy  1975  . Knee arthroscopy  1998, 2007  . Vesicovaginal fistula closure w/ tah  1995  . Abdominal hysterectomy  1982  . Left heart catheterization with coronary angiogram N/A 11/21/2011    Procedure: LEFT  HEART CATHETERIZATION WITH CORONARY ANGIOGRAM;  Surgeon: Josue Hector, MD;  [facility] CATH LAB;  Service: Cardiovascular;  Laterality: N/A;  . Colonoscopy    . Esophagogastroduodenoscopy    . Abdominal hysterectomy      Family History  Problem Relation Age of Onset  . Alzheimer's disease Mother   . Diabetes type II Mother   . Hiatal hernia Mother   . Diabetes Mother   . Emphysema Father   . Cancer Father     leukemia  . Colon cancer Neg Hx   . Depression Sister     suicide  . Diabetes Sister     History   Social History  . Marital Status: Married    Spouse Name: N/A  . Number of Children: 1  . Years of Education: N/A   Occupational History  . IT TECH, admin. assistant    Social History Main Topics  . Smoking status: Never Smoker   . Smokeless tobacco: Never Used  . Alcohol Use: No  . Drug Use: No  . Sexual Activity: No   Other Topics Concern  . Not on file   Social History Narrative   Multiple family members with intolerance to statins.    Current Outpatient Prescriptions on File Prior to Visit  Medication Sig Dispense Refill  . albuterol (PROVENTIL HFA;VENTOLIN HFA) 108 (90 BASE) MCG/ACT inhaler  Inhale 2 puffs into the lungs every 6 (six) hours as needed for wheezing or shortness of breath. 1 Inhaler 0  . Alcohol Swabs (ALCOHOL PADS) 70 % PADS Use prior to checking blood sugars to clean skin    . aspirin 81 MG tablet Take 81 mg by mouth daily.      . dapagliflozin propanediol (FARXIGA) 10 MG TABS tablet Take 10 mg by mouth daily. 90 tablet 3  . fluticasone (FLONASE) 50 MCG/ACT nasal spray Place into both nostrils daily.    . Fluticasone-Salmeterol (ADVAIR DISKUS) 250-50 MCG/DOSE AEPB INHALE 1 PUFF INTO LUNGS 2 TIMES DAILY 3 each 3  . FORA V12 BLOOD GLUCOSE TEST test strip     . glimepiride (AMARYL) 2 MG tablet Take 1 tablet (2 mg total) by mouth daily before breakfast. 90 tablet 0  . Glucosamine-Chondroitin (OSTEO BI-FLEX REGULAR STRENGTH) 250-200 MG TABS  Take 1 tablet by mouth 2 (two) times daily.     . Linagliptin-Metformin HCl (JENTADUETO) 2.09-998 MG TABS Take 1 tablet by mouth 2 (two) times daily. 180 tablet 11  . Lite Touch Lancets MISC     . magnesium oxide (MAG-OX) 400 (241.3 MG) MG tablet Take 1 tablet (400 mg total) by mouth daily. 90 tablet 3  . mometasone (NASONEX) 50 MCG/ACT nasal spray 2 sprays each nares q day 17 g 2  . Multiple Vitamin (MULTIVITAMIN) tablet Take 1 tablet by mouth daily.    . niacin (NIASPAN) 500 MG CR tablet 1 tab po qhs with lowfat snack and Aspirin 1/2 hour prior  Failed all statins and welchol 30 tablet 5  . pantoprazole (PROTONIX) 40 MG tablet TAKE 1 TABLET DAILY 90 tablet 1  . SYNTHROID 88 MCG tablet TAKE 1 TABLET DAILY 90 tablet 1  . triamterene-hydrochlorothiazide (MAXZIDE-25) 37.5-25 MG per tablet Take 0.5 tablets by mouth daily. 45 tablet 3   No current facility-administered medications on file prior to visit.    Allergies  Allergen Reactions  . Epinephrine Other (See Comments)    Pass out   . Simvastatin     REACTION: back ache  . Statins     myalgias  . Welchol [Colesevelam Hcl]     myalgia    Review of Systems  Review of Systems  Constitutional: Positive for malaise/fatigue. Negative for fever.  HENT: Negative for congestion.   Eyes: Negative for discharge.  Respiratory: Negative for shortness of breath.   Cardiovascular: Negative for chest pain, palpitations and leg swelling.  Gastrointestinal: Positive for nausea, vomiting and diarrhea. Negative for abdominal pain.  Genitourinary: Negative for dysuria.  Musculoskeletal: Positive for myalgias. Negative for falls.  Skin: Negative for rash.  Neurological: Positive for weakness. Negative for loss of consciousness and headaches.  Endo/Heme/Allergies: Negative for polydipsia.  Psychiatric/Behavioral: Negative for depression and suicidal ideas. The patient is not nervous/anxious and does not have insomnia.     Objective  BP 110/60  mmHg  Pulse 120  Temp(Src) 98.3 F (36.8 C) (Oral)  Ht 5\' 6"  (1.676 m)  Wt 163 lb 2 oz (73.993 kg)  BMI 26.34 kg/m2  SpO2 98%  Physical Exam  Physical Exam  Constitutional: She is oriented to person, place, and time and well-developed, well-nourished, and in no distress. No distress.  HENT:  Head: Normocephalic and atraumatic.  Dry mucus membranes  Eyes: Conjunctivae are normal.  Neck: Neck supple. No thyromegaly present.  Cardiovascular: Normal rate, regular rhythm and normal heart sounds.   No murmur heard. Pulmonary/Chest: Effort normal and breath sounds  normal. She has no wheezes.  Abdominal: Soft. Bowel sounds are normal. She exhibits no distension and no mass.  vomiting  Musculoskeletal: She exhibits no edema.  Lymphadenopathy:    She has no cervical adenopathy.  Neurological: She is alert and oriented to person, place, and time.  Skin: Skin is warm and dry. No rash noted. She is not diaphoretic.  Psychiatric: Memory, affect and judgment normal.    Lab Results  Component Value Date   TSH 1.70 11/01/2014   Lab Results  Component Value Date   WBC 6.9 11/01/2014   HGB 12.5 11/01/2014   HCT 37.1 11/01/2014   MCV 87.4 11/01/2014   PLT 216.0 11/01/2014   Lab Results  Component Value Date   CREATININE 1.27* 11/01/2014   BUN 18 11/01/2014   NA 138 11/01/2014   K 3.5 11/01/2014   CL 98 11/01/2014   CO2 28 11/01/2014   Lab Results  Component Value Date   ALT 71* 11/01/2014   AST 63* 11/01/2014   ALKPHOS 61 11/01/2014   BILITOT 0.6 11/01/2014   Lab Results  Component Value Date   CHOL 206* 11/01/2014   Lab Results  Component Value Date   HDL 46.10 11/01/2014   Lab Results  Component Value Date   LDLCALC 136* 08/19/2013   Lab Results  Component Value Date   TRIG 242.0* 11/01/2014   Lab Results  Component Value Date   CHOLHDL 4 11/01/2014     Assessment & Plan    Diabetic gastroparesis encouraged bland diet, small frequent meals as  tolerated, may use Phenergan and Zofran prn, increase clear fluids  Diabetes mellitus type 2 in obese hgba1c acceptable, minimize simple carbs. Increase exercise as tolerated. Continue current meds  Hypertension Well controlled, no changes to meds. Encouraged heart healthy diet such as the DASH diet and exercise as tolerated.   Hypomagnesemia Will continue to supplement and we will monitor

## 2014-11-26 LAB — HEPATITIS PANEL, ACUTE
HCV Ab: NEGATIVE
HEP B S AG: NEGATIVE
Hep A IgM: NONREACTIVE
Hep B C IgM: NONREACTIVE

## 2014-11-27 ENCOUNTER — Encounter: Payer: Self-pay | Admitting: Family Medicine

## 2014-11-28 ENCOUNTER — Emergency Department (HOSPITAL_BASED_OUTPATIENT_CLINIC_OR_DEPARTMENT_OTHER): Payer: BLUE CROSS/BLUE SHIELD

## 2014-11-28 ENCOUNTER — Encounter (HOSPITAL_BASED_OUTPATIENT_CLINIC_OR_DEPARTMENT_OTHER): Payer: Self-pay | Admitting: *Deleted

## 2014-11-28 ENCOUNTER — Emergency Department (HOSPITAL_BASED_OUTPATIENT_CLINIC_OR_DEPARTMENT_OTHER)
Admission: EM | Admit: 2014-11-28 | Discharge: 2014-11-28 | Disposition: A | Discharge status: Home / Self Care | Payer: BLUE CROSS/BLUE SHIELD | Attending: Emergency Medicine | Admitting: Emergency Medicine

## 2014-11-28 DIAGNOSIS — R109 Unspecified abdominal pain: Secondary | ICD-10-CM | POA: Diagnosis present

## 2014-11-28 DIAGNOSIS — K219 Gastro-esophageal reflux disease without esophagitis: Secondary | ICD-10-CM | POA: Diagnosis not present

## 2014-11-28 DIAGNOSIS — Z79899 Other long term (current) drug therapy: Secondary | ICD-10-CM | POA: Insufficient documentation

## 2014-11-28 DIAGNOSIS — R100 Acute abdomen: Secondary | ICD-10-CM | POA: Insufficient documentation

## 2014-11-28 DIAGNOSIS — Z9049 Acquired absence of other specified parts of digestive tract: Secondary | ICD-10-CM | POA: Diagnosis not present

## 2014-11-28 DIAGNOSIS — E860 Dehydration: Secondary | ICD-10-CM | POA: Diagnosis not present

## 2014-11-28 DIAGNOSIS — Z7951 Long term (current) use of inhaled steroids: Secondary | ICD-10-CM | POA: Diagnosis not present

## 2014-11-28 DIAGNOSIS — E039 Hypothyroidism, unspecified: Secondary | ICD-10-CM | POA: Diagnosis not present

## 2014-11-28 DIAGNOSIS — K3184 Gastroparesis: Secondary | ICD-10-CM | POA: Diagnosis not present

## 2014-11-28 DIAGNOSIS — Z90710 Acquired absence of both cervix and uterus: Secondary | ICD-10-CM | POA: Insufficient documentation

## 2014-11-28 DIAGNOSIS — Z7982 Long term (current) use of aspirin: Secondary | ICD-10-CM | POA: Diagnosis not present

## 2014-11-28 DIAGNOSIS — G8929 Other chronic pain: Secondary | ICD-10-CM | POA: Diagnosis not present

## 2014-11-28 DIAGNOSIS — R000 Tachycardia, unspecified: Secondary | ICD-10-CM | POA: Insufficient documentation

## 2014-11-28 DIAGNOSIS — J45909 Unspecified asthma, uncomplicated: Secondary | ICD-10-CM | POA: Diagnosis not present

## 2014-11-28 DIAGNOSIS — E0843 Diabetes mellitus due to underlying condition with diabetic autonomic (poly)neuropathy: Secondary | ICD-10-CM | POA: Diagnosis not present

## 2014-11-28 DIAGNOSIS — R197 Diarrhea, unspecified: Secondary | ICD-10-CM

## 2014-11-28 DIAGNOSIS — I10 Essential (primary) hypertension: Secondary | ICD-10-CM | POA: Diagnosis not present

## 2014-11-28 DIAGNOSIS — M1990 Unspecified osteoarthritis, unspecified site: Secondary | ICD-10-CM | POA: Diagnosis not present

## 2014-11-28 LAB — URINALYSIS, ROUTINE W REFLEX MICROSCOPIC
BILIRUBIN URINE: NEGATIVE
Glucose, UA: >1000 mg/dL — AB
HGB URINE DIPSTICK: NEGATIVE
Ketones, ur: NEGATIVE mg/dL
LEUKOCYTES UA: NEGATIVE
Nitrite: NEGATIVE
PROTEIN: NEGATIVE mg/dL
SPECIFIC GRAVITY, URINE: 1.036 — AB (ref 1.005–1.030)
UROBILINOGEN UA: 0.2 mg/dL (ref 0.0–1.0)
pH: 6 (ref 5.0–8.0)

## 2014-11-28 LAB — COMPREHENSIVE METABOLIC PANEL
ALBUMIN: 4.3 g/dL (ref 3.5–5.0)
ALT: 105 U/L — ABNORMAL HIGH (ref 14–54)
ANION GAP: 15 (ref 5–15)
AST: 102 U/L — ABNORMAL HIGH (ref 15–41)
Alkaline Phosphatase: 70 U/L (ref 38–126)
BILIRUBIN TOTAL: 0.7 mg/dL (ref 0.3–1.2)
BUN: 25 mg/dL — ABNORMAL HIGH (ref 6–20)
CHLORIDE: 95 mmol/L — AB (ref 101–111)
CO2: 25 mmol/L (ref 22–32)
Calcium: 9.4 mg/dL (ref 8.9–10.3)
Creatinine, Ser: 1.33 mg/dL — ABNORMAL HIGH (ref 0.44–1.00)
GFR calc non Af Amer: 42 mL/min — ABNORMAL LOW (ref >60)
GFR, EST AFRICAN AMERICAN: 49 mL/min — AB (ref >60)
Glucose, Bld: 156 mg/dL — ABNORMAL HIGH (ref 65–99)
Potassium: 3.4 mmol/L — ABNORMAL LOW (ref 3.5–5.1)
Sodium: 135 mmol/L (ref 135–145)
Total Protein: 8.1 g/dL (ref 6.5–8.1)

## 2014-11-28 LAB — CBC WITH DIFFERENTIAL/PLATELET
BASOS PCT: 0 % (ref 0–1)
Basophils Absolute: 0 10*3/uL (ref 0.0–0.1)
EOS ABS: 0.1 10*3/uL (ref 0.0–0.7)
Eosinophils Relative: 1 % (ref 0–5)
HEMATOCRIT: 42.4 % (ref 36.0–46.0)
Hemoglobin: 14.7 g/dL (ref 12.0–15.0)
Lymphocytes Relative: 27 % (ref 12–46)
Lymphs Abs: 2.7 10*3/uL (ref 0.7–4.0)
MCH: 29.1 pg (ref 26.0–34.0)
MCHC: 34.7 g/dL (ref 30.0–36.0)
MCV: 83.8 fL (ref 78.0–100.0)
MONO ABS: 0.9 10*3/uL (ref 0.1–1.0)
Monocytes Relative: 9 % (ref 3–12)
NEUTROS ABS: 6.1 10*3/uL (ref 1.7–7.7)
NEUTROS PCT: 63 % (ref 43–77)
PLATELETS: 327 10*3/uL (ref 150–400)
RBC: 5.06 MIL/uL (ref 3.87–5.11)
RDW: 13.5 % (ref 11.5–15.5)
WBC: 9.8 10*3/uL (ref 4.0–10.5)

## 2014-11-28 LAB — URINE MICROSCOPIC-ADD ON

## 2014-11-28 LAB — I-STAT CG4 LACTIC ACID, ED: LACTIC ACID, VENOUS: 4.77 mmol/L — AB (ref 0.5–2.0)

## 2014-11-28 LAB — LIPASE, BLOOD: LIPASE: 27 U/L (ref 22–51)

## 2014-11-28 LAB — CBG MONITORING, ED: Glucose-Capillary: 131 mg/dL — ABNORMAL HIGH (ref 65–99)

## 2014-11-28 MED ORDER — ONDANSETRON HCL 4 MG PO TABS: 4.0000 mg | ORAL_TABLET | Freq: Four times a day (QID) | ORAL | Status: DC

## 2014-11-28 MED ORDER — DIPHENOXYLATE-ATROPINE 2.5-0.025 MG PO TABS
1.0000 | ORAL_TABLET | Freq: Four times a day (QID) | ORAL | Status: DC | PRN
Start: 2014-11-28 — End: 2015-06-01

## 2014-11-28 MED ORDER — DIPHENOXYLATE-ATROPINE 2.5-0.025 MG PO TABS
2.0000 | ORAL_TABLET | Freq: Once | ORAL | Status: AC
  Administered 2014-11-28: 2 via ORAL
  Filled 2014-11-28: qty 2

## 2014-11-28 MED ORDER — SODIUM CHLORIDE 0.9 % IV BOLUS (SEPSIS)
1000.0000 mL | Freq: Once | INTRAVENOUS | Status: AC
  Administered 2014-11-28: 1000 mL via INTRAVENOUS

## 2014-11-28 MED ORDER — ONDANSETRON HCL 4 MG/2ML IJ SOLN
4.0000 mg | Freq: Once | INTRAMUSCULAR | Status: AC
  Administered 2014-11-28: 4 mg via INTRAVENOUS
  Filled 2014-11-28: qty 2

## 2014-11-28 MED ORDER — IOHEXOL 300 MG/ML  SOLN
25.0000 mL | Freq: Once | INTRAMUSCULAR | Status: AC | PRN
  Administered 2014-11-28: 25 mL via ORAL

## 2014-11-28 MED ORDER — IOHEXOL 300 MG/ML  SOLN
80.0000 mL | Freq: Once | INTRAMUSCULAR | Status: AC | PRN
  Administered 2014-11-28: 80 mL via INTRAVENOUS

## 2014-11-28 NOTE — ED Provider Notes (Signed)
CSN: [id number]     Arrival date & time 11/28/14  1056 History   First MD Initiated Contact with Patient 11/28/14 1101     Chief Complaint  Patient presents with  . Abdominal Pain     (Consider location/radiation/quality/duration/timing/severity/associated sxs/prior Treatment) HPI Comments: Patient presents to the ER for evaluation of diarrhea. Patient reports that she became ill 5 days ago. She initially had severe nausea and then developed diarrhea. She saw her doctor 2 days ago and was given Phenergan. She reports that her nausea has improved but she continues to have watery diarrhea. She reports everything she drinks "close" to her". She port intermittent abdominal cramping, no continuous abdominal pain. She has not had a fever. Patient reports feeling generalized weakness, thinks she is dehydrated. She has not had any rectal bleeding.  Patient is a 59 y.o. female presenting with abdominal pain.  Abdominal Pain Associated symptoms: diarrhea     Past Medical History  Diagnosis Date  . Hypothyroidism   . Diabetes mellitus type II   . Fatty liver     abnormal transaminases; negative work up  . Atypical chest pain     normal coronaries and LV function by cath 10/2011  . GERD (gastroesophageal reflux disease)   . Osteoarthritis     chronic, right knee  . Plantar fasciitis   . Chronic pain disorder     neck and shoulder  . HTN (hypertension)   . HLD (hyperlipidemia)   . Asthma   . Hip pain, bilateral 07/19/2012  . Other malaise and fatigue 01/27/2013  . CTS (carpal tunnel syndrome) 01/27/2013    right  . Hypokalemia 05/25/2013    Improved stopping HCTZ. Was noted to have an elevated glucose when K was low. Recheck renal next week after starting Maxzide  . Esophageal reflux 08/25/2013    [place], Dr Barth Kirks  . Fundic gland polyps of stomach, benign   . Nausea without vomiting 08/08/2014  . Hypomagnesemia 08/10/2014  . Anterior neck pain 08/10/2014  . Abdominal pain  08/10/2014  . Diabetic gastroparesis    Past Surgical History  Procedure Laterality Date  . Cholecystectomy    . Tonsillectomy  1975  . Knee arthroscopy  1998, 2007  . Vesicovaginal fistula closure w/ tah  1995  . Abdominal hysterectomy  1982  . Left heart catheterization with coronary angiogram N/A 11/21/2011    Procedure: LEFT HEART CATHETERIZATION WITH CORONARY ANGIOGRAM;  Surgeon: Josue Hector, MD;  Location: [facility] CATH LAB;  Service: Cardiovascular;  Laterality: N/A;  . Colonoscopy    . Esophagogastroduodenoscopy    . Abdominal hysterectomy     Family History  Problem Relation Age of Onset  . Alzheimer's disease Mother   . Diabetes type II Mother   . Hiatal hernia Mother   . Diabetes Mother   . Emphysema Father   . Cancer Father     leukemia  . Colon cancer Neg Hx   . Depression Sister     suicide  . Diabetes Sister    History  Substance Use Topics  . Smoking status: Never Smoker   . Smokeless tobacco: Never Used  . Alcohol Use: No   OB History    No data available     Review of Systems  Gastrointestinal: Positive for abdominal pain and diarrhea.  All other systems reviewed and are negative.     Allergies  Epinephrine; Simvastatin; Statins; and Welchol  Home Medications   Prior to Admission medications   Medication  Sig Start Date End Date Taking? Authorizing Provider  albuterol (PROVENTIL HFA;VENTOLIN HFA) 108 (90 BASE) MCG/ACT inhaler Inhale 2 puffs into the lungs every 6 (six) hours as needed for wheezing or shortness of breath. 05/24/14   Mosie Lukes, MD  Alcohol Swabs (ALCOHOL PADS) 70 % PADS Use prior to checking blood sugars to clean skin 07/09/12   Historical Provider, MD  aspirin 81 MG tablet Take 81 mg by mouth daily.      Historical Provider, MD  dapagliflozin propanediol (FARXIGA) 10 MG TABS tablet Take 10 mg by mouth daily. 09/28/14   Renato Shin, MD  fluticasone (FLONASE) 50 MCG/ACT nasal spray Place into both nostrils daily.    Historical  Provider, MD  Fluticasone-Salmeterol (ADVAIR DISKUS) 250-50 MCG/DOSE AEPB INHALE 1 PUFF INTO LUNGS 2 TIMES DAILY 04/09/12   Burnice Logan, MD  FORA [phone] BLOOD GLUCOSE TEST test strip  07/09/12   Historical Provider, MD  glimepiride (AMARYL) 2 MG tablet Take 1 tablet (2 mg total) by mouth daily before breakfast. 11/17/14   Renato Shin, MD  Glucosamine-Chondroitin (OSTEO BI-FLEX REGULAR STRENGTH) 250-200 MG TABS Take 1 tablet by mouth 2 (two) times daily.     Historical Provider, MD  Linagliptin-Metformin HCl (JENTADUETO) 2.09-998 MG TABS Take 1 tablet by mouth 2 (two) times daily. 09/28/14   Renato Shin, MD  Lite Touch Lancets MISC  07/09/12   Historical Provider, MD  magnesium oxide (MAG-OX) 400 (241.3 MG) MG tablet Take 1 tablet (400 mg total) by mouth daily. 11/07/14   Mosie Lukes, MD  mometasone (NASONEX) 50 MCG/ACT nasal spray 2 sprays each nares q day 09/16/14   Mackie Pai, PA-C  Multiple Vitamin (MULTIVITAMIN) tablet Take 1 tablet by mouth daily.    Historical Provider, MD  niacin (NIASPAN) 500 MG CR tablet 1 tab po qhs with lowfat snack and Aspirin 1/2 hour prior  Failed all statins and welchol 08/24/13   Mosie Lukes, MD  pantoprazole (PROTONIX) 40 MG tablet TAKE 1 TABLET DAILY 08/22/14   Mosie Lukes, MD  promethazine (PHENERGAN) 25 MG tablet Take 1 tablet (25 mg total) by mouth every 8 (eight) hours as needed for nausea or vomiting. 11/25/14   Mosie Lukes, MD  SYNTHROID 88 MCG tablet TAKE 1 TABLET DAILY 08/22/14   Mosie Lukes, MD  triamterene-hydrochlorothiazide (MAXZIDE-25) 37.5-25 MG per tablet Take 0.5 tablets by mouth daily. 09/28/14   Renato Shin, MD   BP 120/80 mmHg  Pulse 112  Temp(Src) 98.1 F (36.7 C) (Oral)  Resp 16  Ht 5\' 6"  (1.676 m)  Wt 163 lb (73.936 kg)  BMI 26.32 kg/m2  SpO2 99% Physical Exam  Constitutional: She is oriented to person, place, and time. She appears well-developed and well-nourished. No distress.  HENT:  Head: Normocephalic and atraumatic.   Right Ear: Hearing normal.  Left Ear: Hearing normal.  Nose: Nose normal.  Mouth/Throat: Oropharynx is clear and moist and mucous membranes are normal.  Eyes: Conjunctivae and EOM are normal. Pupils are equal, round, and reactive to light.  Neck: Normal range of motion. Neck supple.  Cardiovascular: Regular rhythm, S1 normal and S2 normal.  Tachycardia present.  Exam reveals no gallop and no friction rub.   No murmur heard. Pulmonary/Chest: Effort normal and breath sounds normal. No respiratory distress. She exhibits no tenderness.  Abdominal: Soft. Normal appearance and bowel sounds are normal. There is no hepatosplenomegaly. There is no tenderness. There is no rebound, no guarding, no tenderness at McBurney's  point and negative Murphy's sign. No hernia.  Musculoskeletal: Normal range of motion.  Neurological: She is alert and oriented to person, place, and time. She has normal strength. No cranial nerve deficit or sensory deficit. Coordination normal. GCS eye subscore is 4. GCS verbal subscore is 5. GCS motor subscore is 6.  Skin: Skin is warm, dry and intact. No rash noted. No cyanosis.  Psychiatric: She has a normal mood and affect. Her speech is normal and behavior is normal. Thought content normal.  Nursing note and vitals reviewed.   ED Course  Procedures (including critical care time) Labs Review Labs Reviewed  COMPREHENSIVE METABOLIC PANEL  CBC WITH DIFFERENTIAL/PLATELET  LIPASE, BLOOD  URINALYSIS, ROUTINE W REFLEX MICROSCOPIC (NOT AT [facility])  I-STAT CG4 LACTIC ACID, ED    Imaging Review No results found.   EKG Interpretation None      MDM   Final diagnoses:  None   vomiting Diarrhea Abdominal pain  She presents to the ER for evaluation of abdominal pain, vomiting and diarrhea. Symptoms began 5 days ago. The nausea and vomiting has resolved with Phenergan, prescribed by PCP. Patient continues, however, to have profuse watery diarrhea. She has intermittent  abdominal cramping as well. Abdominal exam was benign. Patient's lab work was normal except for elevated lactate. This is likely secondary to dehydration. She was aggressively hydrated. CT scan was performed and there was no acute pathology. She does have bilateral ovarian cysts that need to be evaluated as an outpatient, referred back to PCP. Will continue symptomatic treatment.    Orpah Greek, MD 11/28/14 [phone]

## 2014-11-28 NOTE — Discharge Instructions (Signed)
Abdominal Pain Many things can cause abdominal pain. Usually, abdominal pain is not caused by a disease and will improve without treatment. It can often be observed and treated at home. Your health care provider will do a physical exam and possibly order blood tests and X-rays to help determine the seriousness of your pain. However, in many cases, more time must pass before a clear cause of the pain can be found. Before that point, your health care provider may not know if you need more testing or further treatment. HOME CARE INSTRUCTIONS  Monitor your abdominal pain for any changes. The following actions may help to alleviate any discomfort you are experiencing:  Only take over-the-counter or prescription medicines as directed by your health care provider.  Do not take laxatives unless directed to do so by your health care provider.  Try a clear liquid diet (broth, tea, or water) as directed by your health care provider. Slowly move to a bland diet as tolerated. SEEK MEDICAL CARE IF:  You have unexplained abdominal pain.  You have abdominal pain associated with nausea or diarrhea.  You have pain when you urinate or have a bowel movement.  You experience abdominal pain that wakes you in the night.  You have abdominal pain that is worsened or improved by eating food.  You have abdominal pain that is worsened with eating fatty foods.  You have a fever. SEEK IMMEDIATE MEDICAL CARE IF:   Your pain does not go away within 2 hours.  You keep throwing up (vomiting).  Your pain is felt only in portions of the abdomen, such as the right side or the left lower portion of the abdomen.  You pass bloody or black tarry stools. MAKE SURE YOU:  Understand these instructions.   Will watch your condition.   Will get help right away if you are not doing well or get worse.  Document Released: 02/20/2005 Document Revised: 05/18/2013 Document Reviewed: 01/20/2013 [facility] Patient Information  2015 [place], [place]. This information is not intended to replace advice given to you by your health care provider. Make sure you discuss any questions you have with your health care provider.  Dehydration, Adult Dehydration is when you lose more fluids from the body than you take in. Vital organs like the kidneys, brain, and heart cannot function without a proper amount of fluids and salt. Any loss of fluids from the body can cause dehydration.  CAUSES   Vomiting.  Diarrhea.  Excessive sweating.  Excessive urine output.  Fever. SYMPTOMS  Mild dehydration  Thirst.  Dry lips.  Slightly dry mouth. Moderate dehydration  Very dry mouth.  Sunken eyes.  Skin does not bounce back quickly when lightly pinched and released.  Dark urine and decreased urine production.  Decreased tear production.  Headache. Severe dehydration  Very dry mouth.  Extreme thirst.  Rapid, weak pulse (more than 100 beats per minute at rest).  Cold hands and feet.  Not able to sweat in spite of heat and temperature.  Rapid breathing.  Blue lips.  Confusion and lethargy.  Difficulty being awakened.  Minimal urine production.  No tears. DIAGNOSIS  Your caregiver will diagnose dehydration based on your symptoms and your exam. Blood and urine tests will help confirm the diagnosis. The diagnostic evaluation should also identify the cause of dehydration. TREATMENT  Treatment of mild or moderate dehydration can often be done at home by increasing the amount of fluids that you drink. It is best to drink small amounts of  fluid more often. Drinking too much at one time can make vomiting worse. Refer to the home care instructions below. Severe dehydration needs to be treated at the hospital where you will probably be given intravenous (IV) fluids that contain water and electrolytes. HOME CARE INSTRUCTIONS   Ask your caregiver about specific rehydration instructions.  Drink enough fluids to keep  your urine clear or pale yellow.  Drink small amounts frequently if you have nausea and vomiting.  Eat as you normally do.  Avoid:  Foods or drinks high in sugar.  Carbonated drinks.  Juice.  Extremely hot or cold fluids.  Drinks with caffeine.  Fatty, greasy foods.  Alcohol.  Tobacco.  Overeating.  Gelatin desserts.  Wash your hands well to avoid spreading bacteria and viruses.  Only take over-the-counter or prescription medicines for pain, discomfort, or fever as directed by your caregiver.  Ask your caregiver if you should continue all prescribed and over-the-counter medicines.  Keep all follow-up appointments with your caregiver. SEEK MEDICAL CARE IF:  You have abdominal pain and it increases or stays in one area (localizes).  You have a rash, stiff neck, or severe headache.  You are irritable, sleepy, or difficult to awaken.  You are weak, dizzy, or extremely thirsty. SEEK IMMEDIATE MEDICAL CARE IF:   You are unable to keep fluids down or you get worse despite treatment.  You have frequent episodes of vomiting or diarrhea.  You have blood or green matter (bile) in your vomit.  You have blood in your stool or your stool looks black and tarry.  You have not urinated in 6 to 8 hours, or you have only urinated a small amount of very dark urine.  You have a fever.  You faint. MAKE SURE YOU:   Understand these instructions.  Will watch your condition.  Will get help right away if you are not doing well or get worse. Document Released: 05/13/2005 Document Revised: 08/05/2011 Document Reviewed: 12/31/2010 [facility] Patient Information 2015 [place], [place]. This information is not intended to replace advice given to you by your health care provider. Make sure you discuss any questions you have with your health care provider.  Diarrhea Diarrhea is frequent loose and watery bowel movements. It can cause you to feel weak and dehydrated. Dehydration can  cause you to become tired and thirsty, have a dry mouth, and have decreased urination that often is dark yellow. Diarrhea is a sign of another problem, most often an infection that will not last long. In most cases, diarrhea typically lasts 2-3 days. However, it can last longer if it is a sign of something more serious. It is important to treat your diarrhea as directed by your caregiver to lessen or prevent future episodes of diarrhea. CAUSES  Some common causes include:  Gastrointestinal infections caused by viruses, bacteria, or parasites.  Food poisoning or food allergies.  Certain medicines, such as antibiotics, chemotherapy, and laxatives.  Artificial sweeteners and fructose.  Digestive disorders. HOME CARE INSTRUCTIONS  Ensure adequate fluid intake (hydration): Have 1 cup (8 oz) of fluid for each diarrhea episode. Avoid fluids that contain simple sugars or sports drinks, fruit juices, whole milk products, and sodas. Your urine should be clear or pale yellow if you are drinking enough fluids. Hydrate with an oral rehydration solution that you can purchase at pharmacies, retail stores, and online. You can prepare an oral rehydration solution at home by mixing the following ingredients together:   - tsp table salt.   tsp  baking soda.   tsp salt substitute containing potassium chloride.  1  tablespoons sugar.  1 L (34 oz) of water.  Certain foods and beverages may increase the speed at which food moves through the gastrointestinal (GI) tract. These foods and beverages should be avoided and include:  Caffeinated and alcoholic beverages.  High-fiber foods, such as raw fruits and vegetables, nuts, seeds, and whole grain breads and cereals.  Foods and beverages sweetened with sugar alcohols, such as xylitol, sorbitol, and mannitol.  Some foods may be well tolerated and may help thicken stool including:  Starchy foods, such as rice, toast, pasta, low-sugar cereal, oatmeal, grits,  baked potatoes, crackers, and bagels.  Bananas.  Applesauce.  Add probiotic-rich foods to help increase healthy bacteria in the GI tract, such as yogurt and fermented milk products.  Wash your hands well after each diarrhea episode.  Only take over-the-counter or prescription medicines as directed by your caregiver.  Take a warm bath to relieve any burning or pain from frequent diarrhea episodes. SEEK IMMEDIATE MEDICAL CARE IF:   You are unable to keep fluids down.  You have persistent vomiting.  You have blood in your stool, or your stools are black and tarry.  You do not urinate in 6-8 hours, or there is only a small amount of very dark urine.  You have abdominal pain that increases or localizes.  You have weakness, dizziness, confusion, or light-headedness.  You have a severe headache.  Your diarrhea gets worse or does not get better.  You have a fever or persistent symptoms for more than 2-3 days.  You have a fever and your symptoms suddenly get worse. MAKE SURE YOU:   Understand these instructions.  Will watch your condition.  Will get help right away if you are not doing well or get worse. Document Released: 05/03/2002 Document Revised: 09/27/2013 Document Reviewed: 01/19/2012 [facility] Patient Information 2015 [place], [place]. This information is not intended to replace advice given to you by your health care provider. Make sure you discuss any questions you have with your health care provider.

## 2014-11-28 NOTE — ED Notes (Signed)
Abdominal pain, vomiting and diarrhea. States she was seen by her MD on Wednesday of last week and given Phenergan. Relief of nausea. Diarrhea has continued. She feels dehydrated.

## 2014-11-29 ENCOUNTER — Encounter: Payer: Self-pay | Admitting: Endocrinology

## 2014-11-29 ENCOUNTER — Telehealth: Payer: Self-pay | Admitting: Family Medicine

## 2014-11-29 ENCOUNTER — Ambulatory Visit (INDEPENDENT_AMBULATORY_CARE_PROVIDER_SITE_OTHER): Payer: BLUE CROSS/BLUE SHIELD | Admitting: Endocrinology

## 2014-11-29 VITALS — BP 124/62 | HR 79 | Temp 97.8°F | Ht 66.0 in | Wt 164.0 lb

## 2014-11-29 DIAGNOSIS — E119 Type 2 diabetes mellitus without complications: Secondary | ICD-10-CM

## 2014-11-29 DIAGNOSIS — E1169 Type 2 diabetes mellitus with other specified complication: Secondary | ICD-10-CM

## 2014-11-29 DIAGNOSIS — E669 Obesity, unspecified: Secondary | ICD-10-CM

## 2014-11-29 DIAGNOSIS — R197 Diarrhea, unspecified: Secondary | ICD-10-CM

## 2014-11-29 MED ORDER — METFORMIN HCL ER 500 MG PO TB24: 1000.0000 mg | ORAL_TABLET | Freq: Two times a day (BID) | ORAL | Status: DC

## 2014-11-29 MED ORDER — GLIMEPIRIDE 1 MG PO TABS: 1.0000 mg | ORAL_TABLET | Freq: Every day | ORAL | Status: DC

## 2014-11-29 MED ORDER — LINAGLIPTIN 5 MG PO TABS: 5.0000 mg | ORAL_TABLET | Freq: Every day | ORAL | Status: DC

## 2014-11-29 NOTE — Patient Instructions (Addendum)
check your blood sugar once a day.  vary the time of day when you check, between before the 3 meals, and at bedtime.  also check if you have symptoms of your blood sugar being too high or too low.  please keep a record of the readings and bring it to your next appointment here.  You can write it on any piece of paper.  please call us sooner if your blood sugar goes below 70, or if you have a lot of readings over 200.  please change jentadueto to metformin-XR and tradjenta, and:  reduce the glimeperide to 1 mg daily.  i have sent prescriptions to your pharmacy.   Please come back for a follow-up appointment in 2 months.

## 2014-11-29 NOTE — Telephone Encounter (Signed)
Ordered labs for stool test.  Called the patient to inform to schedule lab appt.  Patient refused any further testing at this time as she was in the hospital yesterday having test done and was seen by Dr. Loanne Drilling today.  She had medications change and hopefully was the cause of her problem.

## 2014-11-29 NOTE — Progress Notes (Signed)
Subjective:    Patient ID: [name] [name], female    DOB: 1956-03-18, 59 y.o.   MRN: [id number]  HPI  Pt returns for f/u of diabetes mellitus: DM type: 2 Dx'ed: 4098 Complications: gastgroparesis Therapy: 4 oral meds GDM: never DKA: never Severe hypoglycemia: never Pancreatitis: never Other: she has never been on insulin Interval history: She denies hypoglycemia, except for recent episode of n/v/d.  no cbg record, but states cbg's are otherwise well-controlled. Past Medical History  Diagnosis Date  . Hypothyroidism   . Diabetes mellitus type II   . Fatty liver     abnormal transaminases; negative work up  . Atypical chest pain     normal coronaries and LV function by cath 10/2011  . GERD (gastroesophageal reflux disease)   . Osteoarthritis     chronic, right knee  . Plantar fasciitis   . Chronic pain disorder     neck and shoulder  . HTN (hypertension)   . HLD (hyperlipidemia)   . Asthma   . Hip pain, bilateral 07/19/2012  . Other malaise and fatigue 01/27/2013  . CTS (carpal tunnel syndrome) 01/27/2013    right  . Hypokalemia 05/25/2013    Improved stopping HCTZ. Was noted to have an elevated glucose when K was low. Recheck renal next week after starting Maxzide  . Esophageal reflux 08/25/2013    [facility] [place], Dr Barth Kirks  . Fundic gland polyps of stomach, benign   . Nausea without vomiting 08/08/2014  . Hypomagnesemia 08/10/2014  . Anterior neck pain 08/10/2014  . Abdominal pain 08/10/2014  . Diabetic gastroparesis     Past Surgical History  Procedure Laterality Date  . Cholecystectomy    . Tonsillectomy  1975  . Knee arthroscopy  1998, 2007  . Vesicovaginal fistula closure w/ tah  1995  . Abdominal hysterectomy  1982  . Left heart catheterization with coronary angiogram N/A 11/21/2011    Procedure: LEFT HEART CATHETERIZATION WITH CORONARY ANGIOGRAM;  Surgeon: Josue Hector, MD;  Location: [facility] CATH LAB;  Service: Cardiovascular;  Laterality: N/A;  . Colonoscopy     . Esophagogastroduodenoscopy    . Abdominal hysterectomy      History   Social History  . Marital Status: Married    Spouse Name: N/A  . Number of Children: 1  . Years of Education: N/A   Occupational History  . IT TECH, admin. assistant    Social History Main Topics  . Smoking status: Never Smoker   . Smokeless tobacco: Never Used  . Alcohol Use: No  . Drug Use: No  . Sexual Activity: No   Other Topics Concern  . Not on file   Social History Narrative   Multiple family members with intolerance to statins.    Current Outpatient Prescriptions on File Prior to Visit  Medication Sig Dispense Refill  . albuterol (PROVENTIL HFA;VENTOLIN HFA) 108 (90 BASE) MCG/ACT inhaler Inhale 2 puffs into the lungs every 6 (six) hours as needed for wheezing or shortness of breath. 1 Inhaler 0  . Alcohol Swabs (ALCOHOL PADS) 70 % PADS Use prior to checking blood sugars to clean skin    . aspirin 81 MG tablet Take 81 mg by mouth daily.      . dapagliflozin propanediol (FARXIGA) 10 MG TABS tablet Take 10 mg by mouth daily. 90 tablet 3  . diphenoxylate-atropine (LOMOTIL) 2.5-0.025 MG per tablet Take 1 tablet by mouth 4 (four) times daily as needed for diarrhea or loose stools. 30 tablet 0  .  fluticasone (FLONASE) 50 MCG/ACT nasal spray Place into both nostrils daily.    . Fluticasone-Salmeterol (ADVAIR DISKUS) 250-50 MCG/DOSE AEPB INHALE 1 PUFF INTO LUNGS 2 TIMES DAILY 3 each 3  . FORA V12 BLOOD GLUCOSE TEST test strip     . Glucosamine-Chondroitin (OSTEO BI-FLEX REGULAR STRENGTH) 250-200 MG TABS Take 1 tablet by mouth 2 (two) times daily.     . Lite Touch Lancets MISC     . magnesium oxide (MAG-OX) 400 (241.3 MG) MG tablet Take 1 tablet (400 mg total) by mouth daily. 90 tablet 3  . mometasone (NASONEX) 50 MCG/ACT nasal spray 2 sprays each nares q day 17 g 2  . Multiple Vitamin (MULTIVITAMIN) tablet Take 1 tablet by mouth daily.    . niacin (NIASPAN) 500 MG CR tablet 1 tab po qhs with lowfat  snack and Aspirin 1/2 hour prior  Failed all statins and welchol 30 tablet 5  . ondansetron (ZOFRAN) 4 MG tablet Take 1 tablet (4 mg total) by mouth every 6 (six) hours. 12 tablet 0  . pantoprazole (PROTONIX) 40 MG tablet TAKE 1 TABLET DAILY 90 tablet 1  . promethazine (PHENERGAN) 25 MG tablet Take 1 tablet (25 mg total) by mouth every 8 (eight) hours as needed for nausea or vomiting. 30 tablet 1  . SYNTHROID 88 MCG tablet TAKE 1 TABLET DAILY 90 tablet 1  . triamterene-hydrochlorothiazide (MAXZIDE-25) 37.5-25 MG per tablet Take 0.5 tablets by mouth daily. 45 tablet 3   No current facility-administered medications on file prior to visit.    Allergies  Allergen Reactions  . Epinephrine Other (See Comments)    Pass out   . Simvastatin     REACTION: back ache  . Statins     myalgias  . Welchol [Colesevelam Hcl]     myalgia    Family History  Problem Relation Age of Onset  . Alzheimer's disease Mother   . Diabetes type II Mother   . Hiatal hernia Mother   . Diabetes Mother   . Emphysema Father   . Cancer Father     leukemia  . Colon cancer Neg Hx   . Depression Sister     suicide  . Diabetes Sister     BP 124/62 mmHg  Pulse 79  Temp(Src) 97.8 F (36.6 C) (Oral)  Ht 5\' 6"  (1.676 m)  Wt 164 lb (74.39 kg)  BMI 26.48 kg/m2  SpO2 97%  Review of Systems Denies weight change    Objective:   Physical Exam VITAL SIGNS:  See vs page GENERAL: no distress Pulses: dorsalis pedis intact bilat.   MSK: no deformity of the feet CV: no leg edema Skin:  no ulcer on the feet.  normal color and temp on the feet. Neuro: sensation is intact to touch on the feet.     Lab Results  Component Value Date   HGBA1C 6.4 11/01/2014      Assessment & Plan:  DM: overcontrolled, given this sulfonylurea-containing regimen. Vomiting, new to me.  It is not clear if this is attributable to metformin, but we should change to -XR, to see if this helps.    Patient is advised the  following: Patient Instructions  check your blood sugar once a day.  vary the time of day when you check, between before the 3 meals, and at bedtime.  also check if you have symptoms of your blood sugar being too high or too low.  please keep a record of the readings and bring it  to your next appointment here.  You can write it on any piece of paper.  please call us sooner if your blood sugar goes below 70, or if you have a lot of readings over 200.  please change jentadueto to metformin-XR and tradjenta, and:  reduce the glimeperide to 1 mg daily.  i have sent prescriptions to your pharmacy.   Please come back for a follow-up appointment in 2 months.

## 2014-11-29 NOTE — Telephone Encounter (Signed)
Will wait and see if symptoms resolve

## 2014-12-04 NOTE — Assessment & Plan Note (Signed)
Well controlled, no changes to meds. Encouraged heart healthy diet such as the DASH diet and exercise as tolerated.  °

## 2014-12-04 NOTE — Assessment & Plan Note (Addendum)
encouraged bland diet, small frequent meals as tolerated, may use Phenergan and Zofran prn, increase clear fluids

## 2014-12-04 NOTE — Assessment & Plan Note (Signed)
hgba1c acceptable, minimize simple carbs. Increase exercise as tolerated. Continue current meds 

## 2014-12-04 NOTE — Assessment & Plan Note (Signed)
Will continue to supplement and we will monitor

## 2014-12-20 ENCOUNTER — Encounter: Payer: Self-pay | Admitting: Endocrinology

## 2015-01-19 ENCOUNTER — Other Ambulatory Visit: Payer: Self-pay | Admitting: Family Medicine

## 2015-01-24 ENCOUNTER — Other Ambulatory Visit (INDEPENDENT_AMBULATORY_CARE_PROVIDER_SITE_OTHER): Payer: BLUE CROSS/BLUE SHIELD

## 2015-01-24 DIAGNOSIS — R7402 Elevation of levels of lactic acid dehydrogenase (LDH): Secondary | ICD-10-CM

## 2015-01-24 DIAGNOSIS — R112 Nausea with vomiting, unspecified: Secondary | ICD-10-CM

## 2015-01-24 DIAGNOSIS — E612 Magnesium deficiency: Secondary | ICD-10-CM | POA: Diagnosis not present

## 2015-01-24 DIAGNOSIS — E1169 Type 2 diabetes mellitus with other specified complication: Secondary | ICD-10-CM

## 2015-01-24 DIAGNOSIS — E669 Obesity, unspecified: Secondary | ICD-10-CM

## 2015-01-24 DIAGNOSIS — R740 Nonspecific elevation of levels of transaminase and lactic acid dehydrogenase [LDH]: Secondary | ICD-10-CM

## 2015-01-24 DIAGNOSIS — R197 Diarrhea, unspecified: Secondary | ICD-10-CM

## 2015-01-24 DIAGNOSIS — E039 Hypothyroidism, unspecified: Secondary | ICD-10-CM

## 2015-01-24 DIAGNOSIS — R7989 Other specified abnormal findings of blood chemistry: Secondary | ICD-10-CM | POA: Diagnosis not present

## 2015-01-24 DIAGNOSIS — E119 Type 2 diabetes mellitus without complications: Secondary | ICD-10-CM

## 2015-01-24 DIAGNOSIS — R109 Unspecified abdominal pain: Secondary | ICD-10-CM

## 2015-01-24 DIAGNOSIS — E782 Mixed hyperlipidemia: Secondary | ICD-10-CM

## 2015-01-24 LAB — COMPREHENSIVE METABOLIC PANEL
ALT: 55 U/L — ABNORMAL HIGH (ref 0–35)
AST: 43 U/L — AB (ref 0–37)
Albumin: 4.1 g/dL (ref 3.5–5.2)
Alkaline Phosphatase: 72 U/L (ref 39–117)
BUN: 19 mg/dL (ref 6–23)
CHLORIDE: 101 meq/L (ref 96–112)
CO2: 30 mEq/L (ref 19–32)
Calcium: 9.7 mg/dL (ref 8.4–10.5)
Creatinine, Ser: 1.1 mg/dL (ref 0.40–1.20)
GFR: 53.79 mL/min — ABNORMAL LOW (ref >60.00)
GLUCOSE: 195 mg/dL — AB (ref 70–99)
Potassium: 4.3 mEq/L (ref 3.5–5.1)
SODIUM: 140 meq/L (ref 135–145)
Total Bilirubin: 0.6 mg/dL (ref 0.2–1.2)
Total Protein: 7.3 g/dL (ref 6.0–8.3)

## 2015-01-24 LAB — HEMOGLOBIN A1C: Hgb A1c MFr Bld: 7.7 % — ABNORMAL HIGH (ref 4.6–6.5)

## 2015-01-24 LAB — CBC
HEMATOCRIT: 36.7 % (ref 36.0–46.0)
Hemoglobin: 12.3 g/dL (ref 12.0–15.0)
MCHC: 33.5 g/dL (ref 30.0–36.0)
MCV: 87.6 fl (ref 78.0–100.0)
Platelets: 187 10*3/uL (ref 150.0–400.0)
RBC: 4.19 Mil/uL (ref 3.87–5.11)
RDW: 13.4 % (ref 11.5–15.5)
WBC: 6.4 10*3/uL (ref 4.0–10.5)

## 2015-01-24 LAB — LIPID PANEL
Cholesterol: 224 mg/dL — ABNORMAL HIGH (ref 0–200)
HDL: 42.5 mg/dL (ref >39.00)
NonHDL: 181.93
Total CHOL/HDL Ratio: 5
Triglycerides: 298 mg/dL — ABNORMAL HIGH (ref 0.0–149.0)
VLDL: 59.6 mg/dL — AB (ref 0.0–40.0)

## 2015-01-24 LAB — TSH: TSH: 1.57 u[IU]/mL (ref 0.35–4.50)

## 2015-01-24 LAB — LDL CHOLESTEROL, DIRECT: Direct LDL: 135 mg/dL

## 2015-01-24 LAB — MAGNESIUM: Magnesium: 1.7 mg/dL (ref 1.5–2.5)

## 2015-01-27 ENCOUNTER — Other Ambulatory Visit: Payer: Self-pay

## 2015-01-27 MED ORDER — LINAGLIPTIN 5 MG PO TABS: 5.0000 mg | ORAL_TABLET | Freq: Every day | ORAL | Status: DC

## 2015-01-31 ENCOUNTER — Ambulatory Visit (INDEPENDENT_AMBULATORY_CARE_PROVIDER_SITE_OTHER): Payer: BLUE CROSS/BLUE SHIELD | Admitting: Endocrinology

## 2015-01-31 ENCOUNTER — Encounter: Payer: Self-pay | Admitting: Endocrinology

## 2015-01-31 VITALS — BP 134/76 | HR 98 | Temp 97.7°F | Ht 66.0 in | Wt 166.0 lb

## 2015-01-31 DIAGNOSIS — E119 Type 2 diabetes mellitus without complications: Secondary | ICD-10-CM | POA: Diagnosis not present

## 2015-01-31 DIAGNOSIS — E1169 Type 2 diabetes mellitus with other specified complication: Secondary | ICD-10-CM

## 2015-01-31 DIAGNOSIS — E669 Obesity, unspecified: Secondary | ICD-10-CM

## 2015-01-31 MED ORDER — METFORMIN HCL ER 500 MG PO TB24: 500.0000 mg | ORAL_TABLET | Freq: Every day | ORAL | Status: DC

## 2015-01-31 MED ORDER — GLIMEPIRIDE 1 MG PO TABS: 1.0000 mg | ORAL_TABLET | Freq: Every day | ORAL | Status: DC

## 2015-01-31 MED ORDER — LIRAGLUTIDE 18 MG/3ML ~~LOC~~ SOPN: 1.8000 mg | PEN_INJECTOR | Freq: Every day | SUBCUTANEOUS | Status: DC

## 2015-01-31 NOTE — Patient Instructions (Addendum)
check your blood sugar once a day.  vary the time of day when you check, between before the 3 meals, and at bedtime.  also check if you have symptoms of your blood sugar being too high or too low.  please keep a record of the readings and bring it to your next appointment here.  You can write it on any piece of paper.  please call us sooner if your blood sugar goes below 70, or if you have a lot of readings over 200.  Please resume the metformin at 500 mg daily.   If you have no recurrence of the nausea/diarrhea in 1 week, please add "victoza," once a day.  The side-effect is nausea, which goes away with time.  To avoid this side-effect, start with the lowest (0.6) setting.  After a few days, increase to 1.2.  If you still have little or no nausea, increase to the highest (1.8) setting, and continue that setting.  Here is a discount card.  This medication replaces the "tradjenta."   Also, please reduce the glimepiride to 1 mg daily.   Please come back for a follow-up appointment in 2 months.

## 2015-01-31 NOTE — Progress Notes (Signed)
Subjective:    Patient ID: [name] [name], female    DOB: February 06, 1956, 59 y.o.   MRN: [id number]  HPI Pt returns for f/u of diabetes mellitus: DM type: 2 Dx'ed: 5284 Complications: gastgroparesis Therapy: 4 oral meds GDM: never DKA: never Severe hypoglycemia: never Pancreatitis: never Other: she has never been on insulin Interval history:  She takes amaryl at 4 mg qd, and does not take metformin.  she brings a record of her cbg's which i have reviewed today.  It varies from 100's-200's.  pt states she feels well in general.   Past Medical History  Diagnosis Date  . Hypothyroidism   . Diabetes mellitus type II   . Fatty liver     abnormal transaminases; negative work up  . Atypical chest pain     normal coronaries and LV function by cath 10/2011  . GERD (gastroesophageal reflux disease)   . Osteoarthritis     chronic, right knee  . Plantar fasciitis   . Chronic pain disorder     neck and shoulder  . HTN (hypertension)   . HLD (hyperlipidemia)   . Asthma   . Hip pain, bilateral 07/19/2012  . Other malaise and fatigue 01/27/2013  . CTS (carpal tunnel syndrome) 01/27/2013    right  . Hypokalemia 05/25/2013    Improved stopping HCTZ. Was noted to have an elevated glucose when K was low. Recheck renal next week after starting Maxzide  . Esophageal reflux 08/25/2013    [facility], Dr Barth Kirks  . Fundic gland polyps of stomach, benign   . Nausea without vomiting 08/08/2014  . Hypomagnesemia 08/10/2014  . Anterior neck pain 08/10/2014  . Abdominal pain 08/10/2014  . Diabetic gastroparesis     Past Surgical History  Procedure Laterality Date  . Cholecystectomy    . Tonsillectomy  1975  . Knee arthroscopy  1998, 2007  . Vesicovaginal fistula closure w/ tah  1995  . Abdominal hysterectomy  1982  . Left heart catheterization with coronary angiogram N/A 11/21/2011    Procedure: LEFT HEART CATHETERIZATION WITH CORONARY ANGIOGRAM;  Surgeon: Josue Hector, MD;  Location: [facility] CATH  LAB;  Service: Cardiovascular;  Laterality: N/A;  . Colonoscopy    . Esophagogastroduodenoscopy    . Abdominal hysterectomy      Social History   Social History  . Marital Status: Married    Spouse Name: N/A  . Number of Children: 1  . Years of Education: N/A   Occupational History  . IT TECH, admin. assistant    Social History Main Topics  . Smoking status: Never Smoker   . Smokeless tobacco: Never Used  . Alcohol Use: No  . Drug Use: No  . Sexual Activity: No   Other Topics Concern  . Not on file   Social History Narrative   Multiple family members with intolerance to statins.    Current Outpatient Prescriptions on File Prior to Visit  Medication Sig Dispense Refill  . albuterol (PROVENTIL HFA;VENTOLIN HFA) 108 (90 BASE) MCG/ACT inhaler Inhale 2 puffs into the lungs every 6 (six) hours as needed for wheezing or shortness of breath. 1 Inhaler 0  . Alcohol Swabs (ALCOHOL PADS) 70 % PADS Use prior to checking blood sugars to clean skin    . aspirin 81 MG tablet Take 81 mg by mouth daily.      . dapagliflozin propanediol (FARXIGA) 10 MG TABS tablet Take 10 mg by mouth daily. 90 tablet 3  . diphenoxylate-atropine (LOMOTIL) 2.5-0.025  MG per tablet Take 1 tablet by mouth 4 (four) times daily as needed for diarrhea or loose stools. 30 tablet 0  . fluticasone (FLONASE) 50 MCG/ACT nasal spray Place into both nostrils daily.    . Fluticasone-Salmeterol (ADVAIR DISKUS) 250-50 MCG/DOSE AEPB INHALE 1 PUFF INTO LUNGS 2 TIMES DAILY 3 each 3  . FORA V12 BLOOD GLUCOSE TEST test strip     . Glucosamine-Chondroitin (OSTEO BI-FLEX REGULAR STRENGTH) 250-200 MG TABS Take 1 tablet by mouth 2 (two) times daily.     . Lite Touch Lancets MISC     . mometasone (NASONEX) 50 MCG/ACT nasal spray 2 sprays each nares q day 17 g 2  . Multiple Vitamin (MULTIVITAMIN) tablet Take 1 tablet by mouth daily.    . niacin (NIASPAN) 500 MG CR tablet 1 tab po qhs with lowfat snack and Aspirin 1/2 hour  prior  Failed all statins and welchol 30 tablet 5  . ondansetron (ZOFRAN) 4 MG tablet Take 1 tablet (4 mg total) by mouth every 6 (six) hours. 12 tablet 0  . pantoprazole (PROTONIX) 40 MG tablet TAKE 1 TABLET DAILY 90 tablet 1  . promethazine (PHENERGAN) 25 MG tablet Take 1 tablet (25 mg total) by mouth every 8 (eight) hours as needed for nausea or vomiting. 30 tablet 1  . SYNTHROID 88 MCG tablet TAKE 1 TABLET DAILY 90 tablet 1  . triamterene-hydrochlorothiazide (MAXZIDE-25) 37.5-25 MG per tablet Take 0.5 tablets by mouth daily. 45 tablet 3   No current facility-administered medications on file prior to visit.    Allergies  Allergen Reactions  . Epinephrine Other (See Comments)    Pass out   . Simvastatin     REACTION: back ache  . Statins     myalgias  . Welchol [Colesevelam Hcl]     myalgia    Family History  Problem Relation Age of Onset  . Alzheimer's disease Mother   . Diabetes type II Mother   . Hiatal hernia Mother   . Diabetes Mother   . Emphysema Father   . Cancer Father     leukemia  . Colon cancer Neg Hx   . Depression Sister     suicide  . Diabetes Sister     BP 134/76 mmHg  Pulse 98  Temp(Src) 97.7 F (36.5 C) (Oral)  Ht 5\' 6"  (1.676 m)  Wt 166 lb (75.297 kg)  BMI 26.81 kg/m2  SpO2 97%  Review of Systems She denies hypoglycemia.  Diarrhea is resolved.      Objective:   Physical Exam VITAL SIGNS:  See vs page GENERAL: no distress Pulses: dorsalis pedis intact bilat.   MSK: no deformity of the feet CV: no leg edema Skin:  no ulcer on the feet.  normal color and temp on the feet. Neuro: sensation is intact to touch on the feet   Lab Results  Component Value Date   HGBA1C 7.7* 01/24/2015      Assessment & Plan:  DM: she needs increased rx, if it can be done with a regimen that avoids or minimizes hypoglycemia.    Patient is advised the following: Patient Instructions  check your blood sugar once a day.  vary the time of day when you  check, between before the 3 meals, and at bedtime.  also check if you have symptoms of your blood sugar being too high or too low.  please keep a record of the readings and bring it to your next appointment here.  You can  write it on any piece of paper.  please call us sooner if your blood sugar goes below 70, or if you have a lot of readings over 200.  Please resume the metformin at 500 mg daily.   If you have no recurrence of the nausea/diarrhea in 1 week, please add "victoza," once a day.  The side-effect is nausea, which goes away with time.  To avoid this side-effect, start with the lowest (0.6) setting.  After a few days, increase to 1.2.  If you still have little or no nausea, increase to the highest (1.8) setting, and continue that setting.  Here is a discount card.  This medication replaces the "tradjenta."   Also, please reduce the glimepiride to 1 mg daily.   Please come back for a follow-up appointment in 2 months.

## 2015-02-10 ENCOUNTER — Ambulatory Visit: Payer: BLUE CROSS/BLUE SHIELD | Admitting: Family Medicine

## 2015-02-24 ENCOUNTER — Other Ambulatory Visit: Payer: Self-pay | Admitting: Family Medicine

## 2015-02-24 MED ORDER — PANTOPRAZOLE SODIUM 40 MG PO TBEC: 40.0000 mg | DELAYED_RELEASE_TABLET | Freq: Every day | ORAL | Status: DC

## 2015-02-27 ENCOUNTER — Other Ambulatory Visit: Payer: Self-pay | Admitting: Family Medicine

## 2015-02-27 MED ORDER — LEVOTHYROXINE SODIUM 88 MCG PO TABS: 88.0000 ug | ORAL_TABLET | Freq: Every day | ORAL | Status: DC

## 2015-03-06 ENCOUNTER — Encounter: Payer: Self-pay | Admitting: Endocrinology

## 2015-03-12 ENCOUNTER — Other Ambulatory Visit: Payer: Self-pay | Admitting: Family Medicine

## 2015-03-17 ENCOUNTER — Other Ambulatory Visit: Payer: Self-pay | Admitting: *Deleted

## 2015-03-17 MED ORDER — LIRAGLUTIDE 18 MG/3ML ~~LOC~~ SOPN: 1.8000 mg | PEN_INJECTOR | Freq: Every day | SUBCUTANEOUS | Status: DC

## 2015-03-21 ENCOUNTER — Other Ambulatory Visit: Payer: Self-pay

## 2015-03-21 ENCOUNTER — Encounter: Payer: Self-pay | Admitting: Family Medicine

## 2015-03-21 MED ORDER — LIRAGLUTIDE 18 MG/3ML ~~LOC~~ SOPN: 1.8000 mg | PEN_INJECTOR | Freq: Every day | SUBCUTANEOUS | Status: DC

## 2015-03-28 ENCOUNTER — Telehealth: Payer: Self-pay | Admitting: Family Medicine

## 2015-03-28 ENCOUNTER — Ambulatory Visit: Payer: BLUE CROSS/BLUE SHIELD | Admitting: Family Medicine

## 2015-03-28 NOTE — Telephone Encounter (Signed)
Patient [facility] 5pm appointment for 03/28/15 due to her having to pick up her grandchild from school, patient [facility]. charge or no charge

## 2015-03-28 NOTE — Telephone Encounter (Signed)
No charge. 

## 2015-04-03 ENCOUNTER — Encounter: Payer: Self-pay | Admitting: Endocrinology

## 2015-04-03 ENCOUNTER — Ambulatory Visit (INDEPENDENT_AMBULATORY_CARE_PROVIDER_SITE_OTHER): Payer: BLUE CROSS/BLUE SHIELD | Admitting: Endocrinology

## 2015-04-03 VITALS — BP 126/80 | HR 98 | Temp 97.8°F | Ht 66.0 in | Wt 158.0 lb

## 2015-04-03 DIAGNOSIS — E669 Obesity, unspecified: Secondary | ICD-10-CM

## 2015-04-03 DIAGNOSIS — E1169 Type 2 diabetes mellitus with other specified complication: Secondary | ICD-10-CM

## 2015-04-03 DIAGNOSIS — E119 Type 2 diabetes mellitus without complications: Secondary | ICD-10-CM | POA: Diagnosis not present

## 2015-04-03 LAB — POCT GLYCOSYLATED HEMOGLOBIN (HGB A1C): Hemoglobin A1C: 7.1

## 2015-04-03 MED ORDER — METFORMIN HCL ER 500 MG PO TB24: 1000.0000 mg | ORAL_TABLET | Freq: Every day | ORAL | Status: DC

## 2015-04-03 MED ORDER — GLIMEPIRIDE 1 MG PO TABS: 0.5000 mg | ORAL_TABLET | Freq: Every day | ORAL | Status: DC

## 2015-04-03 NOTE — Progress Notes (Signed)
Subjective:    Patient ID: [name] [name], female    DOB: Dec 15, 1955, 59 y.o.   MRN: [id number]  HPI Pt returns for f/u of diabetes mellitus: DM type: 2 Dx'ed: 8250 Complications: gastgroparesis Therapy: 3 oral meds and victoza GDM: never DKA: never Severe hypoglycemia: never Pancreatitis: never Other: she has never been on insulin; she declines to take prandin or starlix; diarrhea limits metformin dosage.  Interval history: no cbg record, but states cbg's are in the low-100's.  Nausea has not recurred on the victoza (she takes 1.2 mg/day).   Past Medical History  Diagnosis Date  . Hypothyroidism   . Diabetes mellitus type II   . Fatty liver     abnormal transaminases; negative work up  . Atypical chest pain     normal coronaries and LV function by cath 10/2011  . GERD (gastroesophageal reflux disease)   . Osteoarthritis     chronic, right knee  . Plantar fasciitis   . Chronic pain disorder     neck and shoulder  . HTN (hypertension)   . HLD (hyperlipidemia)   . Asthma   . Hip pain, bilateral 07/19/2012  . Other malaise and fatigue 01/27/2013  . CTS (carpal tunnel syndrome) 01/27/2013    right  . Hypokalemia 05/25/2013    Improved stopping HCTZ. Was noted to have an elevated glucose when K was low. Recheck renal next week after starting Maxzide  . Esophageal reflux 08/25/2013    [facility] [place], Dr Barth Kirks  . Fundic gland polyps of stomach, benign   . Nausea without vomiting 08/08/2014  . Hypomagnesemia 08/10/2014  . Anterior neck pain 08/10/2014  . Abdominal pain 08/10/2014  . Diabetic gastroparesis [facility])     Past Surgical History  Procedure Laterality Date  . Cholecystectomy    . Tonsillectomy  1975  . Knee arthroscopy  1998, 2007  . Vesicovaginal fistula closure w/ tah  1995  . Abdominal hysterectomy  1982  . Left heart catheterization with coronary angiogram N/A 11/21/2011    Procedure: LEFT HEART CATHETERIZATION WITH CORONARY ANGIOGRAM;  Surgeon: Josue Hector, MD;  Location: [facility] CATH LAB;  Service: Cardiovascular;  Laterality: N/A;  . Colonoscopy    . Esophagogastroduodenoscopy    . Abdominal hysterectomy      Social History   Social History  . Marital Status: Married    Spouse Name: N/A  . Number of Children: 1  . Years of Education: N/A   Occupational History  . IT TECH, admin. assistant    Social History Main Topics  . Smoking status: Never Smoker   . Smokeless tobacco: Never Used  . Alcohol Use: No  . Drug Use: No  . Sexual Activity: No   Other Topics Concern  . Not on file   Social History Narrative   Multiple family members with intolerance to statins.    Current Outpatient Prescriptions on File Prior to Visit  Medication Sig Dispense Refill  . albuterol (PROVENTIL HFA;VENTOLIN HFA) 108 (90 BASE) MCG/ACT inhaler Inhale 2 puffs into the lungs every 6 (six) hours as needed for wheezing or shortness of breath. 1 Inhaler 0  . Alcohol Swabs (ALCOHOL PADS) 70 % PADS Use prior to checking blood sugars to clean skin    . aspirin 81 MG tablet Take 81 mg by mouth daily.      . dapagliflozin propanediol (FARXIGA) 10 MG TABS tablet Take 10 mg by mouth daily. 90 tablet 3  . diphenoxylate-atropine (LOMOTIL) 2.5-0.025 MG per  tablet Take 1 tablet by mouth 4 (four) times daily as needed for diarrhea or loose stools. 30 tablet 0  . fluticasone (FLONASE) 50 MCG/ACT nasal spray Place into both nostrils daily.    . Fluticasone-Salmeterol (ADVAIR DISKUS) 250-50 MCG/DOSE AEPB INHALE 1 PUFF INTO LUNGS 2 TIMES DAILY 3 each 3  . Glucosamine-Chondroitin (OSTEO BI-FLEX REGULAR STRENGTH) 250-200 MG TABS Take 1 tablet by mouth 2 (two) times daily.     Marland Kitchen levothyroxine (SYNTHROID) 88 MCG tablet Take 1 tablet (88 mcg total) by mouth daily. 90 tablet 1  . Liraglutide (VICTOZA) 18 MG/3ML SOPN Inject 0.3 mLs (1.8 mg total) into the skin daily. And pen needles 1/day 9 mL 0  . Lite Touch Lancets MISC     . mometasone (NASONEX) 50 MCG/ACT nasal spray 2  sprays each nares q day 17 g 2  . Multiple Vitamin (MULTIVITAMIN) tablet Take 1 tablet by mouth daily.    . niacin (NIASPAN) 500 MG CR tablet 1 tab po qhs with lowfat snack and Aspirin 1/2 hour prior  Failed all statins and welchol 30 tablet 5  . ondansetron (ZOFRAN) 4 MG tablet Take 1 tablet (4 mg total) by mouth every 6 (six) hours. 12 tablet 0  . ONE TOUCH ULTRA TEST test strip USE AS DIRECTED ONCE A DAY AND AS NEEDED 100 each 5  . pantoprazole (PROTONIX) 40 MG tablet Take 1 tablet (40 mg total) by mouth daily. 90 tablet 2  . promethazine (PHENERGAN) 25 MG tablet Take 1 tablet (25 mg total) by mouth every 8 (eight) hours as needed for nausea or vomiting. 30 tablet 1  . triamterene-hydrochlorothiazide (MAXZIDE-25) 37.5-25 MG per tablet Take 0.5 tablets by mouth daily. 45 tablet 3   No current facility-administered medications on file prior to visit.    Allergies  Allergen Reactions  . Epinephrine Other (See Comments)    Pass out   . Simvastatin     REACTION: back ache  . Statins     myalgias  . Welchol [Colesevelam Hcl]     myalgia    Family History  Problem Relation Age of Onset  . Alzheimer's disease Mother   . Diabetes type II Mother   . Hiatal hernia Mother   . Diabetes Mother   . Emphysema Father   . Cancer Father     leukemia  . Colon cancer Neg Hx   . Depression Sister     suicide  . Diabetes Sister     BP 126/80 mmHg  Pulse 98  Temp(Src) 97.8 F (36.6 C) (Oral)  Ht 5\' 6"  (1.676 m)  Wt 158 lb (71.668 kg)  BMI 25.51 kg/m2  SpO2 95%  Review of Systems She denies hypoglycemia.  She has lost a few lbs, due to her efforts.     Objective:   Physical Exam VITAL SIGNS:  See vs page GENERAL: no distress Ext: no edema   A1c=7.1%    Assessment & Plan:  DM: she needs increased rx, if it can be done with a regimen that avoids or minimizes hypoglycemia.   Patient is advised the following: Patient Instructions  check your blood sugar once a day.  vary the  time of day when you check, between before the 3 meals, and at bedtime.  also check if you have symptoms of your blood sugar being too high or too low.  please keep a record of the readings and bring it to your next appointment here.  You can write it on any  piece of paper.  please call us sooner if your blood sugar goes below 70, or if you have a lot of readings over 200.  please increase "victoza,"  to the highest (1.8) setting, and:  Increase metformin to 2 pills per day, and:  please reduce the glimepiride to 1/2 pill daily.   If necessary, we can add "cycloset" (or similar generic).   Please come back for a follow-up appointment in 3 months.

## 2015-04-03 NOTE — Patient Instructions (Addendum)
check your blood sugar once a day.  vary the time of day when you check, between before the 3 meals, and at bedtime.  also check if you have symptoms of your blood sugar being too high or too low.  please keep a record of the readings and bring it to your next appointment here.  You can write it on any piece of paper.  please call us sooner if your blood sugar goes below 70, or if you have a lot of readings over 200.  please increase "victoza,"  to the highest (1.8) setting, and:  Increase metformin to 2 pills per day, and:  please reduce the glimepiride to 1/2 pill daily.   If necessary, we can add "cycloset" (or similar generic).   Please come back for a follow-up appointment in 3 months.

## 2015-04-04 ENCOUNTER — Emergency Department
Admission: EM | Admit: 2015-04-04 | Discharge: 2015-04-04 | Disposition: A | Discharge status: Home / Self Care | Payer: BLUE CROSS/BLUE SHIELD | Source: Home / Self Care | Attending: Family Medicine | Admitting: Family Medicine

## 2015-04-04 ENCOUNTER — Emergency Department (INDEPENDENT_AMBULATORY_CARE_PROVIDER_SITE_OTHER): Payer: BLUE CROSS/BLUE SHIELD

## 2015-04-04 ENCOUNTER — Encounter: Payer: Self-pay | Admitting: *Deleted

## 2015-04-04 DIAGNOSIS — M25531 Pain in right wrist: Secondary | ICD-10-CM | POA: Diagnosis not present

## 2015-04-04 DIAGNOSIS — S63501A Unspecified sprain of right wrist, initial encounter: Secondary | ICD-10-CM

## 2015-04-04 DIAGNOSIS — M79641 Pain in right hand: Secondary | ICD-10-CM | POA: Diagnosis not present

## 2015-04-04 NOTE — ED Notes (Signed)
Pt c/o RT hand and wrist pain x lunchtime today post fall. No OTC meds.

## 2015-04-04 NOTE — Discharge Instructions (Signed)
Apply ice pack for 20 to 30 minutes, 3 to 4 times daily  Continue until pain decreases.  Elevate arm/wrist.  Wear wrist brace until follow-up.  May take pain medication as needed.

## 2015-04-04 NOTE — ED Provider Notes (Signed)
CSN: [id number]     Arrival date & time 04/04/15  1808 History   First MD Initiated Contact with Patient 04/04/15 1841     Chief Complaint  Patient presents with  . Hand Injury  . Wrist Injury      HPI Comments: Patient fell on concrete about 7 hours ago, landing on her right hand/wrist.  Patient is a 59 y.o. female presenting with wrist pain. The history is provided by the patient and the spouse.  Wrist Pain This is a new problem. The current episode started 6 to 12 hours ago. The problem occurs constantly. The problem has not changed since onset.Exacerbated by: flexing wrist. Nothing relieves the symptoms. Treatments tried: ice pack. The treatment provided no relief.    Past Medical History  Diagnosis Date  . Hypothyroidism   . Diabetes mellitus type II   . Fatty liver     abnormal transaminases; negative work up  . Atypical chest pain     normal coronaries and LV function by cath 10/2011  . GERD (gastroesophageal reflux disease)   . Osteoarthritis     chronic, right knee  . Plantar fasciitis   . Chronic pain disorder     neck and shoulder  . HTN (hypertension)   . HLD (hyperlipidemia)   . Asthma   . Hip pain, bilateral 07/19/2012  . Other malaise and fatigue 01/27/2013  . CTS (carpal tunnel syndrome) 01/27/2013    right  . Hypokalemia 05/25/2013    Improved stopping HCTZ. Was noted to have an elevated glucose when K was low. Recheck renal next week after starting Maxzide  . Esophageal reflux 08/25/2013    [place], Dr Barth Kirks  . Fundic gland polyps of stomach, benign   . Nausea without vomiting 08/08/2014  . Hypomagnesemia 08/10/2014  . Anterior neck pain 08/10/2014  . Abdominal pain 08/10/2014  . Diabetic gastroparesis [facility])    Past Surgical History  Procedure Laterality Date  . Cholecystectomy    . Tonsillectomy  1975  . Knee arthroscopy  1998, 2007  . Vesicovaginal fistula closure w/ tah  1995  . Abdominal hysterectomy  1982  . Left heart catheterization  with coronary angiogram N/A 11/21/2011    Procedure: LEFT HEART CATHETERIZATION WITH CORONARY ANGIOGRAM;  Surgeon: Josue Hector, MD;  Location: [facility] CATH LAB;  Service: Cardiovascular;  Laterality: N/A;  . Colonoscopy    . Esophagogastroduodenoscopy    . Abdominal hysterectomy     Family History  Problem Relation Age of Onset  . Alzheimer's disease Mother   . Diabetes type II Mother   . Hiatal hernia Mother   . Diabetes Mother   . Emphysema Father   . Cancer Father     leukemia  . Colon cancer Neg Hx   . Depression Sister     suicide  . Diabetes Sister    Social History  Substance Use Topics  . Smoking status: Never Smoker   . Smokeless tobacco: Never Used  . Alcohol Use: No   OB History    No data available     Review of Systems  All other systems reviewed and are negative.   Allergies  Amoxicillin; Epinephrine; Simvastatin; Statins; and Welchol  Home Medications   Prior to Admission medications   Medication Sig Start Date End Date Taking? Authorizing Provider  albuterol (PROVENTIL HFA;VENTOLIN HFA) 108 (90 BASE) MCG/ACT inhaler Inhale 2 puffs into the lungs every 6 (six) hours as needed for wheezing or shortness of breath. 05/24/14  Mosie Lukes, MD  Alcohol Swabs (ALCOHOL PADS) 70 % PADS Use prior to checking blood sugars to clean skin 07/09/12   Historical Provider, MD  aspirin 81 MG tablet Take 81 mg by mouth daily.      Historical Provider, MD  dapagliflozin propanediol (FARXIGA) 10 MG TABS tablet Take 10 mg by mouth daily. 09/28/14   Renato Shin, MD  diphenoxylate-atropine (LOMOTIL) 2.5-0.025 MG per tablet Take 1 tablet by mouth 4 (four) times daily as needed for diarrhea or loose stools. 11/28/14   Orpah Greek, MD  fluticasone (FLONASE) 50 MCG/ACT nasal spray Place into both nostrils daily.    Historical Provider, MD  Fluticasone-Salmeterol (ADVAIR DISKUS) 250-50 MCG/DOSE AEPB INHALE 1 PUFF INTO LUNGS 2 TIMES DAILY 04/09/12   Burnice Logan, MD    glimepiride (AMARYL) 1 MG tablet Take 0.5 tablets (0.5 mg total) by mouth daily with breakfast. 04/03/15   Renato Shin, MD  Glucosamine-Chondroitin (OSTEO BI-FLEX REGULAR STRENGTH) 250-200 MG TABS Take 1 tablet by mouth 2 (two) times daily.     Historical Provider, MD  levothyroxine (SYNTHROID) 88 MCG tablet Take 1 tablet (88 mcg total) by mouth daily. 02/27/15   Mosie Lukes, MD  Liraglutide (VICTOZA) 18 MG/3ML SOPN Inject 0.3 mLs (1.8 mg total) into the skin daily. And pen needles 1/day 03/21/15   Renato Shin, MD  Lite Touch Lancets MISC  07/09/12   Historical Provider, MD  metFORMIN (GLUCOPHAGE-XR) 500 MG 24 hr tablet Take 2 tablets (1,000 mg total) by mouth daily. 04/03/15   Renato Shin, MD  mometasone (NASONEX) 50 MCG/ACT nasal spray 2 sprays each nares q day 09/16/14   Mackie Pai, PA-C  Multiple Vitamin (MULTIVITAMIN) tablet Take 1 tablet by mouth daily.    Historical Provider, MD  niacin (NIASPAN) 500 MG CR tablet 1 tab po qhs with lowfat snack and Aspirin 1/2 hour prior  Failed all statins and welchol 08/24/13   Mosie Lukes, MD  ondansetron (ZOFRAN) 4 MG tablet Take 1 tablet (4 mg total) by mouth every 6 (six) hours. 11/28/14   Orpah Greek, MD  ONE TOUCH ULTRA TEST test strip USE AS DIRECTED ONCE A DAY AND AS NEEDED 03/12/15   Mosie Lukes, MD  pantoprazole (PROTONIX) 40 MG tablet Take 1 tablet (40 mg total) by mouth daily. 02/24/15   Mosie Lukes, MD  promethazine (PHENERGAN) 25 MG tablet Take 1 tablet (25 mg total) by mouth every 8 (eight) hours as needed for nausea or vomiting. 11/25/14   Mosie Lukes, MD  triamterene-hydrochlorothiazide (MAXZIDE-25) 37.5-25 MG per tablet Take 0.5 tablets by mouth daily. 09/28/14   Renato Shin, MD   Meds Ordered and Administered this Visit  Medications - No data to display  BP 121/76 mmHg  Pulse 83  Temp(Src) 98.4 F (36.9 C) (Oral)  Resp 18  Ht 5\' 6"  (1.676 m)  Wt 158 lb (71.668 kg)  BMI 25.51 kg/m2  SpO2 99% No data  found.   Physical Exam  Constitutional: She is oriented to person, place, and time. She appears well-developed and well-nourished. No distress.  HENT:  Head: Normocephalic and atraumatic.  Eyes: Conjunctivae are normal. Pupils are equal, round, and reactive to light.  Neck: Normal range of motion.  Cardiovascular: Regular rhythm.   Pulmonary/Chest: Breath sounds normal.  Abdominal: There is no tenderness.  Musculoskeletal:       Right wrist: She exhibits decreased range of motion, tenderness and bony tenderness. She exhibits no swelling, no  crepitus, no deformity and no laceration.       Right hand: She exhibits decreased range of motion, tenderness and bony tenderness. She exhibits normal capillary refill, no deformity, no laceration and no swelling. Normal sensation noted.       Hands: Right wrist has distinct tenderness to palpation over its ulnar aspect as noted on diagram.  Distal neurovascular function is intact.     Neurological: She is alert and oriented to person, place, and time.  Skin: Skin is warm and dry.  Nursing note and vitals reviewed.   ED Course  Procedures  None  Imaging Review Dg Wrist Complete Right  04/04/2015  CLINICAL DATA:  [place] today.  Hand and wrist pain. EXAM: RIGHT WRIST - COMPLETE 3+ VIEW; RIGHT HAND - COMPLETE 3+ VIEW COMPARISON:  None. FINDINGS: The joint spaces are maintained. Minimal degenerative changes. No acute fractures identified. IMPRESSION: No acute bony findings. Electronically Signed   By: Marijo Sanes M.D.   On: 04/04/2015 19:08   Dg Hand Complete Right  04/04/2015  CLINICAL DATA:  [place] today.  Hand and wrist pain. EXAM: RIGHT WRIST - COMPLETE 3+ VIEW; RIGHT HAND - COMPLETE 3+ VIEW COMPARISON:  None. FINDINGS: The joint spaces are maintained. Minimal degenerative changes. No acute fractures identified. IMPRESSION: No acute bony findings. Electronically Signed   By: Marijo Sanes M.D.   On: 04/04/2015 19:08       MDM   1. Right wrist  sprain, initial encounter; suspect TFCC injury    Wrist splint applied. Apply ice pack for 20 to 30 minutes, 3 to 4 times daily  Continue until pain decreases.  Elevate arm/wrist.  Wear wrist brace until follow-up.  May take pain medication as needed. Followup with Dr. Aundria Mems or Dr. Lynne Leader ([facility]) in one week.     Kandra Nicolas, MD 04/07/15 [phone]

## 2015-04-25 ENCOUNTER — Telehealth: Payer: Self-pay | Admitting: Family Medicine

## 2015-04-25 NOTE — Telephone Encounter (Signed)
Documented flu shot declined

## 2015-04-25 NOTE — Telephone Encounter (Signed)
Called patient to schedule Flu Shot on 11/29. Patient declined

## 2015-06-01 ENCOUNTER — Encounter: Payer: Self-pay | Admitting: Family Medicine

## 2015-06-01 ENCOUNTER — Ambulatory Visit (INDEPENDENT_AMBULATORY_CARE_PROVIDER_SITE_OTHER): Payer: BLUE CROSS/BLUE SHIELD | Admitting: Family Medicine

## 2015-06-01 ENCOUNTER — Other Ambulatory Visit: Payer: Self-pay | Admitting: Family Medicine

## 2015-06-01 VITALS — BP 122/82 | HR 95 | Temp 98.0°F | Ht 66.0 in | Wt 153.4 lb

## 2015-06-01 DIAGNOSIS — R928 Other abnormal and inconclusive findings on diagnostic imaging of breast: Secondary | ICD-10-CM

## 2015-06-01 DIAGNOSIS — I10 Essential (primary) hypertension: Secondary | ICD-10-CM

## 2015-06-01 DIAGNOSIS — E782 Mixed hyperlipidemia: Secondary | ICD-10-CM | POA: Diagnosis not present

## 2015-06-01 DIAGNOSIS — E876 Hypokalemia: Secondary | ICD-10-CM

## 2015-06-01 DIAGNOSIS — E669 Obesity, unspecified: Secondary | ICD-10-CM

## 2015-06-01 DIAGNOSIS — E119 Type 2 diabetes mellitus without complications: Secondary | ICD-10-CM

## 2015-06-01 DIAGNOSIS — E039 Hypothyroidism, unspecified: Secondary | ICD-10-CM

## 2015-06-01 DIAGNOSIS — E1169 Type 2 diabetes mellitus with other specified complication: Secondary | ICD-10-CM

## 2015-06-01 LAB — LIPID PANEL
CHOLESTEROL: 236 mg/dL — AB (ref 0–200)
HDL: 48 mg/dL (ref >39.00)
NonHDL: 188.42
TRIGLYCERIDES: 262 mg/dL — AB (ref 0.0–149.0)
Total CHOL/HDL Ratio: 5
VLDL: 52.4 mg/dL — AB (ref 0.0–40.0)

## 2015-06-01 LAB — CBC
HEMATOCRIT: 39 % (ref 36.0–46.0)
Hemoglobin: 12.8 g/dL (ref 12.0–15.0)
MCHC: 32.8 g/dL (ref 30.0–36.0)
MCV: 84.1 fl (ref 78.0–100.0)
Platelets: 232 10*3/uL (ref 150.0–400.0)
RBC: 4.64 Mil/uL (ref 3.87–5.11)
RDW: 13.9 % (ref 11.5–15.5)
WBC: 7 10*3/uL (ref 4.0–10.5)

## 2015-06-01 LAB — COMPREHENSIVE METABOLIC PANEL
ALT: 26 U/L (ref 0–35)
AST: 22 U/L (ref 0–37)
Albumin: 4.2 g/dL (ref 3.5–5.2)
Alkaline Phosphatase: 83 U/L (ref 39–117)
BILIRUBIN TOTAL: 0.7 mg/dL (ref 0.2–1.2)
BUN: 16 mg/dL (ref 6–23)
CHLORIDE: 99 meq/L (ref 96–112)
CO2: 30 meq/L (ref 19–32)
Calcium: 10.1 mg/dL (ref 8.4–10.5)
Creatinine, Ser: 1.06 mg/dL (ref 0.40–1.20)
GFR: 56.07 mL/min — AB (ref >60.00)
Glucose, Bld: 201 mg/dL — ABNORMAL HIGH (ref 70–99)
POTASSIUM: 4.2 meq/L (ref 3.5–5.1)
Sodium: 139 mEq/L (ref 135–145)
TOTAL PROTEIN: 7.2 g/dL (ref 6.0–8.3)

## 2015-06-01 LAB — MICROALBUMIN / CREATININE URINE RATIO
CREATININE, U: 95.5 mg/dL
MICROALB/CREAT RATIO: 0.7 mg/g (ref 0.0–30.0)
Microalb, Ur: 0.7 mg/dL (ref 0.0–1.9)

## 2015-06-01 LAB — TSH: TSH: 2.03 u[IU]/mL (ref 0.35–4.50)

## 2015-06-01 LAB — LDL CHOLESTEROL, DIRECT: Direct LDL: 151 mg/dL

## 2015-06-01 NOTE — Progress Notes (Signed)
Pre visit review using our clinic review tool, if applicable. No additional management support is needed unless otherwise documented below in the visit note. 

## 2015-06-01 NOTE — Progress Notes (Signed)
Subjective:    Patient ID: [name] [name], female    DOB: Feb 19, 1956, 60 y.o.   MRN: [id number]  Chief Complaint  Patient presents with  . Follow-up    HPI Patient is in today for follow up. Is feeling very well was very stressed with husband's having numerous surgeries. No new concerns today. He has done well. She is very happy with her new diabetic meds and they way she feels. Fatigue is better. No abdominal pain. No new pain complaints. Denies CP/palp/SOB/HA/congestion/fevers/GI or GU c/o. Taking meds as prescribed  Past Medical History  Diagnosis Date  . Hypothyroidism   . Diabetes mellitus type II   . Fatty liver     abnormal transaminases; negative work up  . Atypical chest pain     normal coronaries and LV function by cath 10/2011  . GERD (gastroesophageal reflux disease)   . Osteoarthritis     chronic, right knee  . Plantar fasciitis   . Chronic pain disorder     neck and shoulder  . HTN (hypertension)   . HLD (hyperlipidemia)   . Asthma   . Hip pain, bilateral 07/19/2012  . Other malaise and fatigue 01/27/2013  . CTS (carpal tunnel syndrome) 01/27/2013    right  . Hypokalemia 05/25/2013    Improved stopping HCTZ. Was noted to have an elevated glucose when K was low. Recheck renal next week after starting Maxzide  . Esophageal reflux 08/25/2013    [place], Dr Barth Kirks  . Fundic gland polyps of stomach, benign   . Nausea without vomiting 08/08/2014  . Hypomagnesemia 08/10/2014  . Anterior neck pain 08/10/2014  . Abdominal pain 08/10/2014  . Diabetic gastroparesis [facility])     Past Surgical History  Procedure Laterality Date  . Cholecystectomy    . Tonsillectomy  1975  . Knee arthroscopy  1998, 2007  . Vesicovaginal fistula closure w/ tah  1995  . Abdominal hysterectomy  1982  . Left heart catheterization with coronary angiogram N/A 11/21/2011    Procedure: LEFT HEART CATHETERIZATION WITH CORONARY ANGIOGRAM;  Surgeon: Josue Hector, MD;  Location: [facility] CATH  LAB;  Service: Cardiovascular;  Laterality: N/A;  . Colonoscopy    . Esophagogastroduodenoscopy    . Abdominal hysterectomy      Family History  Problem Relation Age of Onset  . Alzheimer's disease Mother   . Diabetes type II Mother   . Hiatal hernia Mother   . Diabetes Mother   . Emphysema Father   . Cancer Father     leukemia  . Colon cancer Neg Hx   . Depression Sister     suicide  . Diabetes Sister     Social History   Social History  . Marital Status: Married    Spouse Name: N/A  . Number of Children: 1  . Years of Education: N/A   Occupational History  . IT TECH, admin. assistant    Social History Main Topics  . Smoking status: Never Smoker   . Smokeless tobacco: Never Used  . Alcohol Use: No  . Drug Use: No  . Sexual Activity: No   Other Topics Concern  . Not on file   Social History Narrative   Multiple family members with intolerance to statins.    Outpatient Prescriptions Prior to Visit  Medication Sig Dispense Refill  . albuterol (PROVENTIL HFA;VENTOLIN HFA) 108 (90 BASE) MCG/ACT inhaler Inhale 2 puffs into the lungs every 6 (six) hours as needed for wheezing or shortness  of breath. 1 Inhaler 0  . Alcohol Swabs (ALCOHOL PADS) 70 % PADS Use prior to checking blood sugars to clean skin    . aspirin 81 MG tablet Take 81 mg by mouth daily.      . dapagliflozin propanediol (FARXIGA) 10 MG TABS tablet Take 10 mg by mouth daily. 90 tablet 3  . fluticasone (FLONASE) 50 MCG/ACT nasal spray Place into both nostrils daily.    . Fluticasone-Salmeterol (ADVAIR DISKUS) 250-50 MCG/DOSE AEPB INHALE 1 PUFF INTO LUNGS 2 TIMES DAILY 3 each 3  . Glucosamine-Chondroitin (OSTEO BI-FLEX REGULAR STRENGTH) 250-200 MG TABS Take 1 tablet by mouth 2 (two) times daily.     Marland Kitchen levothyroxine (SYNTHROID) 88 MCG tablet Take 1 tablet (88 mcg total) by mouth daily. 90 tablet 1  . Liraglutide (VICTOZA) 18 MG/3ML SOPN Inject 0.3 mLs (1.8 mg total) into the skin daily. And pen needles  1/day 9 mL 0  . Lite Touch Lancets MISC     . metFORMIN (GLUCOPHAGE-XR) 500 MG 24 hr tablet Take 2 tablets (1,000 mg total) by mouth daily. 120 tablet 11  . mometasone (NASONEX) 50 MCG/ACT nasal spray 2 sprays each nares q day 17 g 2  . Multiple Vitamin (MULTIVITAMIN) tablet Take 1 tablet by mouth daily.    . niacin (NIASPAN) 500 MG CR tablet 1 tab po qhs with lowfat snack and Aspirin 1/2 hour prior  Failed all statins and welchol 30 tablet 5  . ONE TOUCH ULTRA TEST test strip USE AS DIRECTED ONCE A DAY AND AS NEEDED 100 each 5  . pantoprazole (PROTONIX) 40 MG tablet Take 1 tablet (40 mg total) by mouth daily. 90 tablet 2  . triamterene-hydrochlorothiazide (MAXZIDE-25) 37.5-25 MG per tablet Take 0.5 tablets by mouth daily. 45 tablet 3  . diphenoxylate-atropine (LOMOTIL) 2.5-0.025 MG per tablet Take 1 tablet by mouth 4 (four) times daily as needed for diarrhea or loose stools. 30 tablet 0  . glimepiride (AMARYL) 1 MG tablet Take 0.5 tablets (0.5 mg total) by mouth daily with breakfast. 45 tablet 3  . ondansetron (ZOFRAN) 4 MG tablet Take 1 tablet (4 mg total) by mouth every 6 (six) hours. 12 tablet 0  . promethazine (PHENERGAN) 25 MG tablet Take 1 tablet (25 mg total) by mouth every 8 (eight) hours as needed for nausea or vomiting. 30 tablet 1   No facility-administered medications prior to visit.    Allergies  Allergen Reactions  . Amoxicillin   . Epinephrine Other (See Comments)    Pass out   . Simvastatin     REACTION: back ache  . Statins     myalgias  . Welchol [Colesevelam Hcl]     myalgia    Review of Systems  Constitutional: Negative for fever and malaise/fatigue.  HENT: Negative for congestion.   Eyes: Negative for discharge.  Respiratory: Negative for shortness of breath.   Cardiovascular: Negative for chest pain, palpitations and leg swelling.  Gastrointestinal: Negative for nausea and abdominal pain.  Genitourinary: Negative for dysuria.  Musculoskeletal: Negative  for falls.  Skin: Negative for rash.  Neurological: Negative for loss of consciousness and headaches.  Endo/Heme/Allergies: Negative for environmental allergies.  Psychiatric/Behavioral: Negative for depression. The patient is not nervous/anxious.        Objective:    Physical Exam  Constitutional: She is oriented to person, place, and time. She appears well-developed and well-nourished. No distress.  HENT:  Head: Normocephalic and atraumatic.  Nose: Nose normal.  Eyes: Right eye exhibits no  discharge. Left eye exhibits no discharge.  Neck: Normal range of motion. Neck supple.  Cardiovascular: Normal rate and regular rhythm.   No murmur heard. Pulmonary/Chest: Effort normal and breath sounds normal.  Abdominal: Soft. Bowel sounds are normal. There is no tenderness.  Musculoskeletal: She exhibits no edema.  Neurological: She is alert and oriented to person, place, and time.  Skin: Skin is warm and dry.  Psychiatric: She has a normal mood and affect.  Nursing note and vitals reviewed.   BP 122/82 mmHg  Pulse 95  Temp(Src) 98 F (36.7 C) (Oral)  Ht 5\' 6"  (1.676 m)  Wt 153 lb 6 oz (69.57 kg)  BMI 24.77 kg/m2  SpO2 97% Wt Readings from Last 3 Encounters:  06/01/15 153 lb 6 oz (69.57 kg)  04/04/15 158 lb (71.668 kg)  04/03/15 158 lb (71.668 kg)     Lab Results  Component Value Date   WBC 6.4 01/24/2015   HGB 12.3 01/24/2015   HCT 36.7 01/24/2015   PLT 187.0 01/24/2015   GLUCOSE 195* 01/24/2015   CHOL 224* 01/24/2015   TRIG 298.0* 01/24/2015   HDL 42.50 01/24/2015   LDLDIRECT 135.0 01/24/2015   LDLCALC 136* 08/19/2013   ALT 55* 01/24/2015   AST 43* 01/24/2015   NA 140 01/24/2015   K 4.3 01/24/2015   CL 101 01/24/2015   CREATININE 1.10 01/24/2015   BUN 19 01/24/2015   CO2 30 01/24/2015   TSH 1.57 01/24/2015   INR 1.07 11/20/2011   HGBA1C 7.1 04/03/2015   MICROALBUR 2.8* 07/07/2012    Lab Results  Component Value Date   TSH 1.57 01/24/2015   Lab Results   Component Value Date   WBC 6.4 01/24/2015   HGB 12.3 01/24/2015   HCT 36.7 01/24/2015   MCV 87.6 01/24/2015   PLT 187.0 01/24/2015   Lab Results  Component Value Date   NA 140 01/24/2015   K 4.3 01/24/2015   CO2 30 01/24/2015   GLUCOSE 195* 01/24/2015   BUN 19 01/24/2015   CREATININE 1.10 01/24/2015   BILITOT 0.6 01/24/2015   ALKPHOS 72 01/24/2015   AST 43* 01/24/2015   ALT 55* 01/24/2015   PROT 7.3 01/24/2015   ALBUMIN 4.1 01/24/2015   CALCIUM 9.7 01/24/2015   ANIONGAP 15 11/28/2014   GFR 53.79* 01/24/2015   Lab Results  Component Value Date   CHOL 224* 01/24/2015   Lab Results  Component Value Date   HDL 42.50 01/24/2015   Lab Results  Component Value Date   LDLCALC 136* 08/19/2013   Lab Results  Component Value Date   TRIG 298.0* 01/24/2015   Lab Results  Component Value Date   CHOLHDL 5 01/24/2015   Lab Results  Component Value Date   HGBA1C 7.1 04/03/2015       Assessment & Plan:   Problem List Items Addressed This Visit    Diabetes mellitus type 2 in obese ([place])   Relevant Orders   Lipid panel   CBC   TSH   Comprehensive metabolic panel   Microalbumin / creatinine urine ratio   Hyperlipidemia, mixed - Primary   Relevant Orders   Lipid panel   CBC   TSH   Comprehensive metabolic panel   Microalbumin / creatinine urine ratio   Hypertension   Relevant Orders   Lipid panel   CBC   TSH   Comprehensive metabolic panel   Microalbumin / creatinine urine ratio   Hypokalemia   Relevant Orders   Lipid panel  CBC   TSH   Comprehensive metabolic panel   Microalbumin / creatinine urine ratio   Hypomagnesemia   Relevant Orders   Lipid panel   CBC   TSH   Comprehensive metabolic panel   Microalbumin / creatinine urine ratio   Hypothyroidism   Relevant Orders   Lipid panel   CBC   TSH   Comprehensive metabolic panel   Microalbumin / creatinine urine ratio      I have discontinued Ms. Lemme's promethazine, ondansetron,  diphenoxylate-atropine, and glimepiride. I am also having her maintain her aspirin, Glucosamine-Chondroitin, Fluticasone-Salmeterol, multivitamin, Alcohol Pads, LITE TOUCH LANCETS, niacin, albuterol, mometasone, fluticasone, dapagliflozin propanediol, triamterene-hydrochlorothiazide, pantoprazole, levothyroxine, ONE TOUCH ULTRA TEST, Liraglutide, and metFORMIN.  No orders of the defined types were placed in this encounter.     Penni Homans, MD

## 2015-06-01 NOTE — Patient Instructions (Addendum)
Check with insurance regarding Zostavax and Prevnar (PCV 130) immunizations. Call for nurse appt   Cholesterol Cholesterol is a white, waxy, fat-like substance needed by your body in small amounts. The liver makes all the cholesterol you need. Cholesterol is carried from the liver by the blood through the blood vessels. Deposits of cholesterol (plaque) may build up on blood vessel walls. These make the arteries narrower and stiffer. Cholesterol plaques increase the risk for heart attack and stroke.  You cannot feel your cholesterol level even if it is very high. The only way to know it is high is with a blood test. Once you know your cholesterol levels, you should keep a record of the test results. Work with your health care provider to keep your levels in the desired range.  WHAT DO THE RESULTS MEAN?  Total cholesterol is a rough measure of all the cholesterol in your blood.   LDL is the so-called bad cholesterol. This is the type that deposits cholesterol in the walls of the arteries. You want this level to be low.   HDL is the good cholesterol because it cleans the arteries and carries the LDL away. You want this level to be high.  Triglycerides are fat that the body can either burn for energy or store. High levels are closely linked to heart disease.  WHAT ARE THE DESIRED LEVELS OF CHOLESTEROL?  Total cholesterol below 200.   LDL below 100 for people at risk, below 70 for those at very high risk.   HDL above 50 is good, above 60 is best.   Triglycerides below 150.  HOW CAN I LOWER MY CHOLESTEROL?  Diet. Follow your diet programs as directed by your health care provider.   Choose fish or white meat chicken and [place], roasted or baked. Limit fatty cuts of red meat, fried foods, and processed meats, such as sausage and lunch meats.   Eat lots of fresh fruits and vegetables.  Choose whole grains, beans, pasta, potatoes, and cereals.   Use only small amounts of olive,  corn, or canola oils.   Avoid butter, mayonnaise, shortening, or palm kernel oils.  Avoid foods with trans fats.   Drink skim or nonfat milk and eat low-fat or nonfat yogurt and cheeses. Avoid whole milk, cream, ice cream, egg yolks, and full-fat cheeses.   Healthy desserts include angel food cake, ginger snaps, animal crackers, hard candy, popsicles, and low-fat or nonfat frozen yogurt. Avoid pastries, cakes, pies, and cookies.   Exercise. Follow your exercise programs as directed by your health care provider.   A regular program helps decrease LDL and raise HDL.   A regular program helps with weight control.   Do things that increase your activity level like gardening, walking, or taking the stairs. Ask your health care provider about how you can be more active in your daily life.   Medicine. Take medicine only as directed by your health care provider.   Medicine may be prescribed by your health care provider to help lower cholesterol and decrease the risk for heart disease.   If you have several risk factors, you may need medicine even if your levels are normal.   This information is not intended to replace advice given to you by your health care provider. Make sure you discuss any questions you have with your health care provider.   Document Released: 02/05/2001 Document Revised: 06/03/2014 Document Reviewed: 02/24/2013 Elsevier Interactive Patient Education [employer].

## 2015-06-02 ENCOUNTER — Other Ambulatory Visit: Payer: Self-pay | Admitting: Family Medicine

## 2015-06-02 MED ORDER — FENOFIBRATE 160 MG PO TABS: 160.0000 mg | ORAL_TABLET | Freq: Every day | ORAL | Status: DC

## 2015-06-05 ENCOUNTER — Other Ambulatory Visit: Payer: Self-pay | Admitting: Family Medicine

## 2015-06-05 ENCOUNTER — Other Ambulatory Visit: Payer: Self-pay

## 2015-06-05 MED ORDER — METFORMIN HCL ER 500 MG PO TB24: 1000.0000 mg | ORAL_TABLET | Freq: Every day | ORAL | Status: DC

## 2015-06-07 ENCOUNTER — Other Ambulatory Visit: Payer: Self-pay

## 2015-06-07 MED ORDER — METFORMIN HCL ER 500 MG PO TB24: 1000.0000 mg | ORAL_TABLET | Freq: Every day | ORAL | Status: DC

## 2015-06-21 ENCOUNTER — Other Ambulatory Visit: Payer: Self-pay

## 2015-06-21 DIAGNOSIS — Z1231 Encounter for screening mammogram for malignant neoplasm of breast: Secondary | ICD-10-CM

## 2015-07-04 ENCOUNTER — Ambulatory Visit: Payer: BLUE CROSS/BLUE SHIELD | Admitting: Endocrinology

## 2015-07-05 ENCOUNTER — Ambulatory Visit
Admission: RE | Admit: 2015-07-05 | Discharge: 2015-07-05 | Disposition: A | Discharge status: Home / Self Care | Payer: BLUE CROSS/BLUE SHIELD | Source: Ambulatory Visit

## 2015-07-05 DIAGNOSIS — Z1231 Encounter for screening mammogram for malignant neoplasm of breast: Secondary | ICD-10-CM

## 2015-07-07 ENCOUNTER — Ambulatory Visit: Payer: BLUE CROSS/BLUE SHIELD | Admitting: Endocrinology

## 2015-07-20 ENCOUNTER — Other Ambulatory Visit: Payer: Self-pay | Admitting: Family Medicine

## 2015-07-20 MED ORDER — FENOFIBRATE 160 MG PO TABS: 160.0000 mg | ORAL_TABLET | Freq: Every day | ORAL | Status: DC

## 2015-07-24 ENCOUNTER — Ambulatory Visit (INDEPENDENT_AMBULATORY_CARE_PROVIDER_SITE_OTHER): Payer: BLUE CROSS/BLUE SHIELD | Admitting: Endocrinology

## 2015-07-24 ENCOUNTER — Encounter: Payer: Self-pay | Admitting: Endocrinology

## 2015-07-24 VITALS — BP 122/78 | HR 87 | Temp 98.1°F | Ht 66.0 in | Wt 152.0 lb

## 2015-07-24 DIAGNOSIS — E119 Type 2 diabetes mellitus without complications: Secondary | ICD-10-CM | POA: Diagnosis not present

## 2015-07-24 DIAGNOSIS — E669 Obesity, unspecified: Secondary | ICD-10-CM | POA: Diagnosis not present

## 2015-07-24 DIAGNOSIS — E1169 Type 2 diabetes mellitus with other specified complication: Secondary | ICD-10-CM

## 2015-07-24 DIAGNOSIS — E1143 Type 2 diabetes mellitus with diabetic autonomic (poly)neuropathy: Secondary | ICD-10-CM | POA: Diagnosis not present

## 2015-07-24 LAB — POCT GLYCOSYLATED HEMOGLOBIN (HGB A1C): Hemoglobin A1C: 7.2

## 2015-07-24 MED ORDER — BROMOCRIPTINE MESYLATE 2.5 MG PO TABS: ORAL_TABLET | ORAL | Status: DC

## 2015-07-24 NOTE — Patient Instructions (Addendum)
check your blood sugar once a day.  vary the time of day when you check, between before the 3 meals, and at bedtime.  also check if you have symptoms of your blood sugar being too high or too low.  please keep a record of the readings and bring it to your next appointment here.  You can write it on any piece of paper.  please call us sooner if your blood sugar goes below 70, or if you have a lot of readings over 200.  i have sent a prescription to your pharmacy, to add "bromocriptine." It has possible side effects of nausea and dizziness.  These go away with time.  You can avoid these by taking it at bedtime at first.   Please come back for a follow-up appointment in 3 months.

## 2015-07-24 NOTE — Progress Notes (Signed)
Subjective:    Patient ID: [name] [name], female    DOB: 01-Dec-1955, 60 y.o.   MRN: [id number]  HPI Pt returns for f/u of diabetes mellitus: DM type: 2 Dx'ed: 123456 Complications: gastgroparesis Therapy: 2 oral meds and victoza GDM: never DKA: never Severe hypoglycemia: never Pancreatitis: never Other: she has never been on insulin; she declines to take prandin or starlix; diarrhea limits metformin dosage.  Interval history: no cbg record, but states cbg's are well-controlled.  She now takes the full 1.8 mg/d of victoza.  Past Medical History  Diagnosis Date  . Hypothyroidism   . Diabetes mellitus type II   . Fatty liver     abnormal transaminases; negative work up  . Atypical chest pain     normal coronaries and LV function by cath 10/2011  . GERD (gastroesophageal reflux disease)   . Osteoarthritis     chronic, right knee  . Plantar fasciitis   . Chronic pain disorder     neck and shoulder  . HTN (hypertension)   . HLD (hyperlipidemia)   . Asthma   . Hip pain, bilateral 07/19/2012  . Other malaise and fatigue 01/27/2013  . CTS (carpal tunnel syndrome) 01/27/2013    right  . Hypokalemia 05/25/2013    Improved stopping HCTZ. Was noted to have an elevated glucose when K was low. Recheck renal next week after starting Maxzide  . Esophageal reflux 08/25/2013    [facility], Dr Barth Kirks  . Fundic gland polyps of stomach, benign   . Nausea without vomiting 08/08/2014  . Hypomagnesemia 08/10/2014  . Anterior neck pain 08/10/2014  . Abdominal pain 08/10/2014  . Diabetic gastroparesis [facility])     Past Surgical History  Procedure Laterality Date  . Cholecystectomy    . Tonsillectomy  1975  . Knee arthroscopy  1998, 2007  . Vesicovaginal fistula closure w/ tah  1995  . Abdominal hysterectomy  1982  . Left heart catheterization with coronary angiogram N/A 11/21/2011    Procedure: LEFT HEART CATHETERIZATION WITH CORONARY ANGIOGRAM;  Surgeon: Josue Hector, MD;  Location: [facility]  CATH LAB;  Service: Cardiovascular;  Laterality: N/A;  . Colonoscopy    . Esophagogastroduodenoscopy    . Abdominal hysterectomy      Social History   Social History  . Marital Status: Married    Spouse Name: N/A  . Number of Children: 1  . Years of Education: N/A   Occupational History  . IT TECH, admin. assistant    Social History Main Topics  . Smoking status: Never Smoker   . Smokeless tobacco: Never Used  . Alcohol Use: No  . Drug Use: No  . Sexual Activity: No   Other Topics Concern  . Not on file   Social History Narrative   Multiple family members with intolerance to statins.    Current Outpatient Prescriptions on File Prior to Visit  Medication Sig Dispense Refill  . albuterol (PROVENTIL HFA;VENTOLIN HFA) 108 (90 BASE) MCG/ACT inhaler Inhale 2 puffs into the lungs every 6 (six) hours as needed for wheezing or shortness of breath. 1 Inhaler 0  . Alcohol Swabs (ALCOHOL PADS) 70 % PADS Use prior to checking blood sugars to clean skin    . aspirin 81 MG tablet Take 81 mg by mouth daily.      . dapagliflozin propanediol (FARXIGA) 10 MG TABS tablet Take 10 mg by mouth daily. 90 tablet 3  . fenofibrate 160 MG tablet Take 1 tablet (160 mg total)  by mouth daily. 90 tablet 2  . fluticasone (FLONASE) 50 MCG/ACT nasal spray Place into both nostrils daily.    . Fluticasone-Salmeterol (ADVAIR DISKUS) 250-50 MCG/DOSE AEPB INHALE 1 PUFF INTO LUNGS 2 TIMES DAILY 3 each 3  . Glucosamine-Chondroitin (OSTEO BI-FLEX REGULAR STRENGTH) 250-200 MG TABS Take 1 tablet by mouth 2 (two) times daily.     Marland Kitchen levothyroxine (SYNTHROID) 88 MCG tablet Take 1 tablet (88 mcg total) by mouth daily. 90 tablet 1  . Liraglutide (VICTOZA) 18 MG/3ML SOPN Inject 0.3 mLs (1.8 mg total) into the skin daily. And pen needles 1/day 9 mL 0  . metFORMIN (GLUCOPHAGE-XR) 500 MG 24 hr tablet Take 2 tablets (1,000 mg total) by mouth daily. 180 tablet 4  . mometasone (NASONEX) 50 MCG/ACT nasal spray 2 sprays each nares  q day (Patient taking differently: PRN) 17 g 2  . Multiple Vitamin (MULTIVITAMIN) tablet Take 1 tablet by mouth daily.    . niacin (NIASPAN) 500 MG CR tablet 1 tab po qhs with lowfat snack and Aspirin 1/2 hour prior  Failed all statins and welchol 30 tablet 5  . ONE TOUCH ULTRA TEST test strip USE AS DIRECTED ONCE A DAY AND AS NEEDED 100 each 5  . ONETOUCH DELICA LANCETS 99991111 MISC USE AS DIRECTED ONCE A DAY AND AS NEEDED 100 each 1  . pantoprazole (PROTONIX) 40 MG tablet Take 1 tablet (40 mg total) by mouth daily. 90 tablet 2  . triamterene-hydrochlorothiazide (MAXZIDE-25) 37.5-25 MG per tablet Take 0.5 tablets by mouth daily. 45 tablet 3   No current facility-administered medications on file prior to visit.    Allergies  Allergen Reactions  . Amoxicillin   . Epinephrine Other (See Comments)    Pass out   . Simvastatin     REACTION: back ache  . Statins     myalgias  . Welchol [Colesevelam Hcl]     myalgia    Family History  Problem Relation Age of Onset  . Alzheimer's disease Mother   . Diabetes type II Mother   . Hiatal hernia Mother   . Diabetes Mother   . Emphysema Father   . Cancer Father     leukemia  . Colon cancer Neg Hx   . Depression Sister     suicide  . Diabetes Sister     BP 122/78 mmHg  Pulse 87  Temp(Src) 98.1 F (36.7 C) (Oral)  Ht 5\' 6"  (1.676 m)  Wt 152 lb (68.947 kg)  BMI 24.55 kg/m2  SpO2 93%  Review of Systems She denies hypoglycemia.  She has lost weight, due to her effort.      Objective:   Physical Exam VITAL SIGNS:  See vs page GENERAL: no distress Pulses: dorsalis pedis intact bilat.   MSK: no deformity of the feet CV: no leg edema Skin:  no ulcer on the feet.  normal color and temp on the feet. Neuro: sensation is intact to touch on the feet.   A1c=7.2%    Assessment & Plan:  DM: she needs increased rx, if it can be done with a regimen that avoids or minimizes hypoglycemia.  Patient is advised the following: Patient  Instructions  check your blood sugar once a day.  vary the time of day when you check, between before the 3 meals, and at bedtime.  also check if you have symptoms of your blood sugar being too high or too low.  please keep a record of the readings and bring it to  your next appointment here.  You can write it on any piece of paper.  please call us sooner if your blood sugar goes below 70, or if you have a lot of readings over 200.  i have sent a prescription to your pharmacy, to add "bromocriptine." It has possible side effects of nausea and dizziness.  These go away with time.  You can avoid these by taking it at bedtime at first.   Please come back for a follow-up appointment in 3 months.

## 2015-07-26 DIAGNOSIS — E119 Type 2 diabetes mellitus without complications: Secondary | ICD-10-CM | POA: Insufficient documentation

## 2015-07-28 ENCOUNTER — Encounter: Payer: Self-pay | Admitting: Endocrinology

## 2015-07-28 ENCOUNTER — Telehealth: Payer: Self-pay | Admitting: Endocrinology

## 2015-07-28 MED ORDER — PIOGLITAZONE HCL 15 MG PO TABS: 15.0000 mg | ORAL_TABLET | Freq: Every day | ORAL | Status: DC

## 2015-08-07 ENCOUNTER — Telehealth: Payer: Self-pay | Admitting: Family Medicine

## 2015-08-07 NOTE — Telephone Encounter (Signed)
LM for pt to call and schedule flu shot or update records. °

## 2015-08-11 ENCOUNTER — Telehealth: Payer: Self-pay | Admitting: Endocrinology

## 2015-08-11 NOTE — Telephone Encounter (Signed)
I contacted the pt and advised of note below. Pt stated she would like to give the pioglitizone more time to work at this time.

## 2015-08-11 NOTE — Telephone Encounter (Signed)
The pioglitizone takes time to work. Your last a1c was just 7.2%.   Your options are to add repaglinide, or to give the pioglitizone more time.

## 2015-08-11 NOTE — Telephone Encounter (Signed)
please call patient: This was a Pharmacist, community message she did not read:  Hi [name] [name]:  Thanks for you message.   i have sent a prescription to your pharmacy, to change to pioglitizone.  i'll see you next time.

## 2015-08-11 NOTE — Telephone Encounter (Signed)
I contacted the [name] [name] and advised of note below. [name] [name] stated she did get the message and believes the [place] is not working her fasting blood sugar has been 175 -205 for the past month. Please advise, Thanks!

## 2015-08-14 ENCOUNTER — Other Ambulatory Visit: Payer: Self-pay | Admitting: Endocrinology

## 2015-08-14 ENCOUNTER — Encounter: Payer: Self-pay | Admitting: Endocrinology

## 2015-08-14 MED ORDER — PIOGLITAZONE HCL 15 MG PO TABS: 15.0000 mg | ORAL_TABLET | Freq: Every day | ORAL | Status: DC

## 2015-08-27 ENCOUNTER — Other Ambulatory Visit: Payer: Self-pay | Admitting: Family Medicine

## 2015-09-18 ENCOUNTER — Other Ambulatory Visit: Payer: Self-pay | Admitting: Endocrinology

## 2015-09-18 MED ORDER — PIOGLITAZONE HCL 15 MG PO TABS: 15.0000 mg | ORAL_TABLET | Freq: Every day | ORAL | Status: DC

## 2015-09-20 ENCOUNTER — Encounter: Payer: Self-pay | Admitting: Endocrinology

## 2015-09-20 ENCOUNTER — Encounter: Payer: Self-pay | Admitting: Family Medicine

## 2015-09-20 NOTE — Telephone Encounter (Signed)
Order Providers    Prescribing Provider Encounter Provider   Renato Shin, MD Renato Shin, MD    Medication Detail      Disp Refills Start End     triamterene-hydrochlorothiazide [facility]) 37.5-25 MG tablet 45 tablet 3 09/18/2015     Sig: TAKE 1/2 TABLET BY MOUTH EVERY DAY    E-Prescribing Status: Receipt confirmed by pharmacy (09/18/2015 8:12 AM EDT)     Pharmacy    [id number] - [place], [place] - [address]    Will make patient aware that rx [facility] be available [facility] in [place].

## 2015-09-26 ENCOUNTER — Encounter: Payer: Self-pay | Admitting: Physician Assistant

## 2015-09-26 ENCOUNTER — Ambulatory Visit (INDEPENDENT_AMBULATORY_CARE_PROVIDER_SITE_OTHER): Payer: BLUE CROSS/BLUE SHIELD | Admitting: Physician Assistant

## 2015-09-26 ENCOUNTER — Ambulatory Visit (HOSPITAL_BASED_OUTPATIENT_CLINIC_OR_DEPARTMENT_OTHER)
Admission: RE | Admit: 2015-09-26 | Discharge: 2015-09-26 | Disposition: A | Discharge status: Home / Self Care | Payer: BLUE CROSS/BLUE SHIELD | Source: Ambulatory Visit | Attending: Physician Assistant | Admitting: Physician Assistant

## 2015-09-26 VITALS — BP 116/68 | HR 94 | Temp 98.0°F | Resp 16 | Ht 66.0 in | Wt 151.1 lb

## 2015-09-26 DIAGNOSIS — M952 Other acquired deformity of head: Secondary | ICD-10-CM | POA: Diagnosis not present

## 2015-09-26 DIAGNOSIS — M6250 Muscle wasting and atrophy, not elsewhere classified, unspecified site: Secondary | ICD-10-CM | POA: Diagnosis not present

## 2015-09-26 HISTORY — DX: Muscle wasting and atrophy, not elsewhere classified, unspecified site: M62.50

## 2015-09-26 NOTE — Progress Notes (Signed)
Patient presents to clinic today c/o inversion of skin underneath her left cheekbone over the past month that has slowly worsened. Denies pain, numbness, tingling. Denies trauma or injury. Denies vision changes, dysphasia, dysphagia or decreased ROM of jaw. Denies history of stroke or heart attack.  Past Medical History  Diagnosis Date  . Hypothyroidism   . Diabetes mellitus type II   . Fatty liver     abnormal transaminases; negative work up  . Atypical chest pain     normal coronaries and LV function by cath 10/2011  . GERD (gastroesophageal reflux disease)   . Osteoarthritis     chronic, right knee  . Plantar fasciitis   . Chronic pain disorder     neck and shoulder  . HTN (hypertension)   . HLD (hyperlipidemia)   . Asthma   . Hip pain, bilateral 07/19/2012  . Other malaise and fatigue 01/27/2013  . CTS (carpal tunnel syndrome) 01/27/2013    right  . Hypokalemia 05/25/2013    Improved stopping HCTZ. Was noted to have an elevated glucose when K was low. Recheck renal next week after starting Maxzide  . Esophageal reflux 08/25/2013    [facility], Dr Barth Kirks  . Fundic gland polyps of stomach, benign   . Nausea without vomiting 08/08/2014  . Hypomagnesemia 08/10/2014  . Anterior neck pain 08/10/2014  . Abdominal pain 08/10/2014  . Diabetic gastroparesis [facility])     Current Outpatient Prescriptions on File Prior to Visit  Medication Sig Dispense Refill  . albuterol (PROVENTIL HFA;VENTOLIN HFA) 108 (90 BASE) MCG/ACT inhaler Inhale 2 puffs into the lungs every 6 (six) hours as needed for wheezing or shortness of breath. 1 Inhaler 0  . Alcohol Swabs (ALCOHOL PADS) 70 % PADS Use prior to checking blood sugars to clean skin    . aspirin 81 MG tablet Take 81 mg by mouth daily.      Marland Kitchen FARXIGA 10 MG TABS tablet TAKE 1 TABLET BY MOUTH EVERY DAY 90 tablet 3  . fenofibrate 160 MG tablet Take 1 tablet (160 mg total) by mouth daily. 90 tablet 2  . fluticasone (FLONASE) 50 MCG/ACT nasal  spray Place into both nostrils daily.    . Fluticasone-Salmeterol (ADVAIR DISKUS) 250-50 MCG/DOSE AEPB INHALE 1 PUFF INTO LUNGS 2 TIMES DAILY 3 each 3  . Glucosamine-Chondroitin (OSTEO BI-FLEX REGULAR STRENGTH) 250-200 MG TABS Take 1 tablet by mouth 2 (two) times daily.     . metFORMIN (GLUCOPHAGE-XR) 500 MG 24 hr tablet Take 2 tablets (1,000 mg total) by mouth daily. 180 tablet 4  . mometasone (NASONEX) 50 MCG/ACT nasal spray 2 sprays each nares q day (Patient taking differently: daily as needed. PRN) 17 g 2  . Multiple Vitamin (MULTIVITAMIN) tablet Take 1 tablet by mouth daily.    . niacin (NIASPAN) 500 MG CR tablet 1 tab po qhs with lowfat snack and Aspirin 1/2 hour prior  Failed all statins and welchol (Patient taking differently: Take 500 mg by mouth at bedtime. 1 tab po qhs with lowfat snack and Aspirin 1/2 hour prior  Failed all statins and welchol) 30 tablet 5  . ONE TOUCH ULTRA TEST test strip USE AS DIRECTED ONCE A DAY AND AS NEEDED 100 each 5  . ONETOUCH DELICA LANCETS 99991111 MISC USE AS DIRECTED ONCE A DAY AND AS NEEDED 100 each 1  . pantoprazole (PROTONIX) 40 MG tablet Take 1 tablet (40 mg total) by mouth daily. 90 tablet 2  . pioglitazone (ACTOS) 15 MG  tablet Take 1 tablet (15 mg total) by mouth daily. 90 tablet 2  . SYNTHROID 88 MCG tablet TAKE 1 TABLET DAILY 90 tablet 1  . triamterene-hydrochlorothiazide (MAXZIDE-25) 37.5-25 MG tablet TAKE 1/2 TABLET BY MOUTH EVERY DAY 45 tablet 3  . VICTOZA 18 MG/3ML SOPN INJECT 0.3 MLS (1.8 MG TOTAL) INTO THE SKIN DAILY. AND PEN NEEDLES 1/DAY 9 mL 1   No current facility-administered medications on file prior to visit.    Allergies  Allergen Reactions  . Amoxicillin   . Epinephrine Other (See Comments)    Pass out   . Simvastatin     REACTION: back ache  . Statins     myalgias  . Welchol [Colesevelam Hcl]     myalgia    Family History  Problem Relation Age of Onset  . Alzheimer's disease Mother   . Diabetes type II Mother   .  Hiatal hernia Mother   . Diabetes Mother   . Emphysema Father   . Cancer Father     leukemia  . Colon cancer Neg Hx   . Depression Sister     suicide  . Diabetes Sister     Social History   Social History  . Marital Status: Married    Spouse Name: N/A  . Number of Children: 1  . Years of Education: N/A   Occupational History  . IT TECH, admin. assistant    Social History Main Topics  . Smoking status: Never Smoker   . Smokeless tobacco: Never Used  . Alcohol Use: No  . Drug Use: No  . Sexual Activity: No   Other Topics Concern  . None   Social History Narrative   Multiple family members with intolerance to statins.   Review of Systems - See HPI.  All other ROS are negative.  BP 116/68 mmHg  Pulse 94  Temp(Src) 98 F (36.7 C) (Oral)  Resp 16  Ht 5\' 6"  (1.676 m)  Wt 151 lb 2 oz (68.55 kg)  BMI 24.40 kg/m2  SpO2 98%  Physical Exam  Constitutional: She is oriented to person, place, and time and well-developed, well-nourished, and in no distress.  HENT:  Head: Normocephalic and atraumatic.  Eyes: Conjunctivae are normal.  Cardiovascular: Normal rate, regular rhythm, normal heart sounds and intact distal pulses.   Pulmonary/Chest: Effort normal and breath sounds normal. No respiratory distress. She has no wheezes. She has no rales. She exhibits no tenderness.  Neurological: She is alert and oriented to person, place, and time. She has intact cranial nerves.  Mild facial asymmetry noted. On closer examination seems the muscles underneath zygomatic bone (masseter and buccinator) are atrophied compared to the R side. CN intact.   Vitals reviewed.   Recent Results (from the past 2160 hour(s))  POCT glycosylated hemoglobin (Hb A1C)     Status: None   Collection Time: 07/24/15  4:05 PM  Result Value Ref Range   Hemoglobin A1C 7.2     Assessment/Plan: Focal muscle atrophy Of left buccal region. No bony abnormality noted. Cranial nerves intact. Mild asymmetry due  to atrophy. Patient asymptomatic. Neuro exam unremarkable overall. Will obtain CT Head to further assess brain and facial musculature. Urgent referral placed to Neurology for hemifacial atrophy. Question Leslee Home Romberg syndrome versus other cause of facial atrophy. CT would show if there had been recent neurological event although these symptoms have had a very gradual onset and slow worsening.

## 2015-09-26 NOTE — Progress Notes (Signed)
Pre visit review using our clinic review tool, if applicable. No additional management support is needed unless otherwise documented below in the visit note/SLS  

## 2015-09-26 NOTE — Patient Instructions (Signed)
Please speak with Marj up front to get CT scan scheduled. You will receive a phone call from Neurology for further assessment.  If you notice any acute changes, please go to the ER.

## 2015-09-26 NOTE — Assessment & Plan Note (Signed)
Of left buccal region. No bony abnormality noted. Cranial nerves intact. Mild asymmetry due to atrophy. Patient asymptomatic. Neuro exam unremarkable overall. Will obtain CT Head to further assess brain and facial musculature. Urgent referral placed to Neurology for hemifacial atrophy. Question Leslee Home Romberg syndrome versus other cause of facial atrophy. CT would show if there had been recent neurological event although these symptoms have had a very gradual onset and slow worsening.

## 2015-10-24 ENCOUNTER — Ambulatory Visit (INDEPENDENT_AMBULATORY_CARE_PROVIDER_SITE_OTHER): Payer: BLUE CROSS/BLUE SHIELD | Admitting: Endocrinology

## 2015-10-24 ENCOUNTER — Encounter: Payer: Self-pay | Admitting: Endocrinology

## 2015-10-24 VITALS — BP 124/80 | HR 95 | Temp 98.0°F | Wt 151.0 lb

## 2015-10-24 DIAGNOSIS — E1143 Type 2 diabetes mellitus with diabetic autonomic (poly)neuropathy: Secondary | ICD-10-CM

## 2015-10-24 LAB — POCT GLYCOSYLATED HEMOGLOBIN (HGB A1C): HEMOGLOBIN A1C: 6.8

## 2015-10-24 MED ORDER — PIOGLITAZONE HCL 30 MG PO TABS: 30.0000 mg | ORAL_TABLET | Freq: Every day | ORAL | Status: DC

## 2015-10-24 NOTE — Patient Instructions (Addendum)
check your blood sugar once a day.  vary the time of day when you check, between before the 3 meals, and at bedtime.  also check if you have symptoms of your blood sugar being too high or too low.  please keep a record of the readings and bring it to your next appointment here.  You can write it on any piece of paper.  please call us sooner if your blood sugar goes below 70, or if you have a lot of readings over 200.  i have sent a prescription to your pharmacy, to increase the pioglitizone.   Please come back for a follow-up appointment in 4 months.

## 2015-10-24 NOTE — Progress Notes (Signed)
Subjective:    Patient ID: [name] [name], female    DOB: Mar 28, 1956, 60 y.o.   MRN: [id number]  HPI Pt returns for f/u of diabetes mellitus: DM type: 2 Dx'ed: 123456 Complications: gastgroparesis Therapy: 3 oral meds and victoza GDM: never DKA: never Severe hypoglycemia: never Pancreatitis: never Other: she has never been on insulin; she declines to take prandin or starlix; diarrhea limits metformin dosage; she did not tolerate parlodel (vomiting).   Interval history: no cbg record, but states cbg's are well-controlled.  She now takes the full 1.8 mg/d of victoza.  Past Medical History  Diagnosis Date  . Hypothyroidism   . Diabetes mellitus type II   . Fatty liver     abnormal transaminases; negative work up  . Atypical chest pain     normal coronaries and LV function by cath 10/2011  . GERD (gastroesophageal reflux disease)   . Osteoarthritis     chronic, right knee  . Plantar fasciitis   . Chronic pain disorder     neck and shoulder  . HTN (hypertension)   . HLD (hyperlipidemia)   . Asthma   . Hip pain, bilateral 07/19/2012  . Other malaise and fatigue 01/27/2013  . CTS (carpal tunnel syndrome) 01/27/2013    right  . Hypokalemia 05/25/2013    Improved stopping HCTZ. Was noted to have an elevated glucose when K was low. Recheck renal next week after starting Maxzide  . Esophageal reflux 08/25/2013    [facility] [place], Dr Barth Kirks  . Fundic gland polyps of stomach, benign   . Nausea without vomiting 08/08/2014  . Hypomagnesemia 08/10/2014  . Anterior neck pain 08/10/2014  . Abdominal pain 08/10/2014  . Diabetic gastroparesis [facility])     Past Surgical History  Procedure Laterality Date  . Cholecystectomy    . Tonsillectomy  1975  . Knee arthroscopy  1998, 2007  . Vesicovaginal fistula closure w/ tah  1995  . Abdominal hysterectomy  1982  . Left heart catheterization with coronary angiogram N/A 11/21/2011    Procedure: LEFT HEART CATHETERIZATION WITH CORONARY ANGIOGRAM;   Surgeon: Josue Hector, MD;  Location: [facility] CATH LAB;  Service: Cardiovascular;  Laterality: N/A;  . Colonoscopy    . Esophagogastroduodenoscopy    . Abdominal hysterectomy      Social History   Social History  . Marital Status: Married    Spouse Name: N/A  . Number of Children: 1  . Years of Education: N/A   Occupational History  . IT TECH, admin. assistant    Social History Main Topics  . Smoking status: Never Smoker   . Smokeless tobacco: Never Used  . Alcohol Use: No  . Drug Use: No  . Sexual Activity: No   Other Topics Concern  . Not on file   Social History Narrative   Multiple family members with intolerance to statins.    Current Outpatient Prescriptions on File Prior to Visit  Medication Sig Dispense Refill  . albuterol (PROVENTIL HFA;VENTOLIN HFA) 108 (90 BASE) MCG/ACT inhaler Inhale 2 puffs into the lungs every 6 (six) hours as needed for wheezing or shortness of breath. 1 Inhaler 0  . Alcohol Swabs (ALCOHOL PADS) 70 % PADS Use prior to checking blood sugars to clean skin    . aspirin 81 MG tablet Take 81 mg by mouth daily.      Marland Kitchen FARXIGA 10 MG TABS tablet TAKE 1 TABLET BY MOUTH EVERY DAY 90 tablet 3  . fenofibrate 160 MG tablet  Take 1 tablet (160 mg total) by mouth daily. 90 tablet 2  . fluticasone (FLONASE) 50 MCG/ACT nasal spray Place into both nostrils daily.    . Fluticasone-Salmeterol (ADVAIR DISKUS) 250-50 MCG/DOSE AEPB INHALE 1 PUFF INTO LUNGS 2 TIMES DAILY 3 each 3  . Glucosamine-Chondroitin (OSTEO BI-FLEX REGULAR STRENGTH) 250-200 MG TABS Take 1 tablet by mouth 2 (two) times daily.     . metFORMIN (GLUCOPHAGE-XR) 500 MG 24 hr tablet Take 2 tablets (1,000 mg total) by mouth daily. 180 tablet 4  . mometasone (NASONEX) 50 MCG/ACT nasal spray 2 sprays each nares q day (Patient taking differently: daily as needed. PRN) 17 g 2  . Multiple Vitamin (MULTIVITAMIN) tablet Take 1 tablet by mouth daily.    . niacin (NIASPAN) 500 MG CR tablet 1 tab po qhs with  lowfat snack and Aspirin 1/2 hour prior  Failed all statins and welchol (Patient taking differently: Take 500 mg by mouth at bedtime. 1 tab po qhs with lowfat snack and Aspirin 1/2 hour prior  Failed all statins and welchol) 30 tablet 5  . ONE TOUCH ULTRA TEST test strip USE AS DIRECTED ONCE A DAY AND AS NEEDED 100 each 5  . ONETOUCH DELICA LANCETS 99991111 MISC USE AS DIRECTED ONCE A DAY AND AS NEEDED 100 each 1  . pantoprazole (PROTONIX) 40 MG tablet Take 1 tablet (40 mg total) by mouth daily. 90 tablet 2  . SYNTHROID 88 MCG tablet TAKE 1 TABLET DAILY 90 tablet 1  . triamterene-hydrochlorothiazide (MAXZIDE-25) 37.5-25 MG tablet TAKE 1/2 TABLET BY MOUTH EVERY DAY 45 tablet 3  . VICTOZA 18 MG/3ML SOPN INJECT 0.3 MLS (1.8 MG TOTAL) INTO THE SKIN DAILY. AND PEN NEEDLES 1/DAY 9 mL 1   No current facility-administered medications on file prior to visit.    Allergies  Allergen Reactions  . Amoxicillin   . Epinephrine Other (See Comments)    Pass out   . Simvastatin     REACTION: back ache  . Statins     myalgias  . Welchol [Colesevelam Hcl]     myalgia    Family History  Problem Relation Age of Onset  . Alzheimer's disease Mother   . Diabetes type II Mother   . Hiatal hernia Mother   . Diabetes Mother   . Emphysema Father   . Cancer Father     leukemia  . Colon cancer Neg Hx   . Depression Sister     suicide  . Diabetes Sister     BP 124/80 mmHg  Pulse 95  Temp(Src) 98 F (36.7 C) (Oral)  Wt 151 lb (68.493 kg)  SpO2 96%  Review of Systems No change in amount of urination from many years ago.    Objective:   Physical Exam VITAL SIGNS:  See vs page GENERAL: no distress Pulses: dorsalis pedis intact bilat.   MSK: no deformity of the feet CV: no leg edema Skin:  no ulcer on the feet.  normal color and temp on the feet. Neuro: sensation is intact to touch on the feet Ext: ganglion cyst at the plantar aspect of the left foot.    A1c=6.8%    Assessment & Plan:  Type  2 DM: she needs increased rx, if it can be done with a regimen that avoids or minimizes hypoglycemia.  Patient is advised the following: Patient Instructions  check your blood sugar once a day.  vary the time of day when you check, between before the 3 meals, and at  bedtime.  also check if you have symptoms of your blood sugar being too high or too low.  please keep a record of the readings and bring it to your next appointment here.  You can write it on any piece of paper.  please call us sooner if your blood sugar goes below 70, or if you have a lot of readings over 200.  i have sent a prescription to your pharmacy, to increase the pioglitizone.   Please come back for a follow-up appointment in 4 months.       Renato Shin, MD

## 2015-10-25 ENCOUNTER — Ambulatory Visit: Payer: BLUE CROSS/BLUE SHIELD | Admitting: Neurology

## 2015-11-03 ENCOUNTER — Ambulatory Visit (INDEPENDENT_AMBULATORY_CARE_PROVIDER_SITE_OTHER): Payer: BLUE CROSS/BLUE SHIELD | Admitting: Podiatry

## 2015-11-03 ENCOUNTER — Ambulatory Visit (INDEPENDENT_AMBULATORY_CARE_PROVIDER_SITE_OTHER): Payer: BLUE CROSS/BLUE SHIELD

## 2015-11-03 ENCOUNTER — Encounter: Payer: Self-pay | Admitting: Podiatry

## 2015-11-03 VITALS — BP 116/79 | HR 93 | Resp 16

## 2015-11-03 DIAGNOSIS — M722 Plantar fascial fibromatosis: Secondary | ICD-10-CM

## 2015-11-03 DIAGNOSIS — M79672 Pain in left foot: Secondary | ICD-10-CM | POA: Diagnosis not present

## 2015-11-03 MED ORDER — TRIAMCINOLONE ACETONIDE 10 MG/ML IJ SUSP
10.0000 mg | Freq: Once | INTRAMUSCULAR | Status: AC
  Administered 2015-11-03: 10 mg

## 2015-11-03 NOTE — Progress Notes (Signed)
   Subjective:    Patient ID: [name] [name], female    DOB: 05/11/1956, 60 y.o.   MRN: [id number]  HPI Chief Complaint  Patient presents with  . Foot Pain    Left foot; knot in arch; pt stated, "Knot appears larger in the morning; if stand or walk too much, foot hurts"; x1.5 months; Pt Diabetic Type 2; Sugar=152 this am; A1C=6.8      Review of Systems  All other systems reviewed and are negative.      Objective:   Physical Exam        Assessment & Plan:

## 2015-11-03 NOTE — Progress Notes (Signed)
Subjective:     Patient ID: PEONY LAVORGNA, female   DOB: 07-May-1956, 60 y.o.   MRN: [id number]  HPI patient states I have a lot of pain in my left arch with a lesion that is growing over the last 6 weeks and is becoming mildly to moderately discomfort   Review of Systems  All other systems reviewed and are negative.      Objective:   Physical Exam  Constitutional: She is oriented to person, place, and time.  Cardiovascular: Intact distal pulses.   Musculoskeletal: Normal range of motion.  Neurological: She is oriented to person, place, and time.  Skin: Skin is warm.  Nursing note and vitals reviewed.  Neurovascular status intact muscle strength adequate with patient having within the mid arch area nodule measuring 15 x 15 mm that's moderately tender when pressed and is within the medial band of the fascia. Patient's found have good digital perfusion is well oriented 3    Assessment:     Nodule formation probable plantar fibroma or cyst formation    Plan:     H&P and x-rays reviewed and at this time Lilia Pro attempt to shrink it explaining eventually it may need to be excised. Patient wants to take this approach and today I injected around the inflamed fascia and the nodule with 3 mg Kenalog 5 mill grams Xylocaine and advised on heat and ice therapy. Reappoint to recheck as needed if it should grow in size or become painful  X-ray report indicates that there is no indications of calcification with moderate depression of the arch

## 2015-11-17 ENCOUNTER — Encounter: Payer: Self-pay | Admitting: Family Medicine

## 2015-11-24 ENCOUNTER — Encounter: Payer: BLUE CROSS/BLUE SHIELD | Admitting: Family Medicine

## 2015-11-28 ENCOUNTER — Other Ambulatory Visit: Payer: Self-pay | Admitting: Family Medicine

## 2015-12-19 ENCOUNTER — Ambulatory Visit: Payer: BLUE CROSS/BLUE SHIELD | Admitting: Neurology

## 2016-01-01 LAB — HM DIABETES EYE EXAM

## 2016-02-09 ENCOUNTER — Encounter: Payer: Self-pay | Admitting: Family Medicine

## 2016-02-15 ENCOUNTER — Other Ambulatory Visit: Payer: Self-pay | Admitting: Family Medicine

## 2016-02-23 ENCOUNTER — Ambulatory Visit (INDEPENDENT_AMBULATORY_CARE_PROVIDER_SITE_OTHER): Payer: BLUE CROSS/BLUE SHIELD | Admitting: Endocrinology

## 2016-02-23 VITALS — BP 118/62 | HR 102 | Ht 66.0 in | Wt 159.0 lb

## 2016-02-23 DIAGNOSIS — E1143 Type 2 diabetes mellitus with diabetic autonomic (poly)neuropathy: Secondary | ICD-10-CM | POA: Diagnosis not present

## 2016-02-23 LAB — POCT GLYCOSYLATED HEMOGLOBIN (HGB A1C): HEMOGLOBIN A1C: 6.8

## 2016-02-23 NOTE — Progress Notes (Signed)
Subjective:    Patient ID: [name] [name], female    DOB: 01-Aug-1955, 60 y.o.   MRN: [id number]  HPI Pt returns for f/u of diabetes mellitus: DM type: 2 Dx'ed: 123456 Complications: gastgroparesis Therapy: 3 oral meds and victoza GDM: never DKA: never Severe hypoglycemia: never Pancreatitis: never.  Other: she has never been on insulin; she declines to take prandin or starlix; diarrhea limits metformin dosage; she did not tolerate parlodel (vomiting).   Interval history: no cbg record, but states cbg's are well-controlled.  pt states she feels well in general.   Past Medical History:  Diagnosis Date  . Abdominal pain 08/10/2014  . Anterior neck pain 08/10/2014  . Asthma   . Atypical chest pain    normal coronaries and LV function by cath 10/2011  . Chronic pain disorder    neck and shoulder  . CTS (carpal tunnel syndrome) 01/27/2013   right  . Diabetes mellitus type II   . Diabetic gastroparesis ([place])   . Esophageal reflux 08/25/2013   [facility] [place], Dr Barth Kirks  . Fatty liver    abnormal transaminases; negative work up  . Fundic gland polyps of stomach, benign   . GERD (gastroesophageal reflux disease)   . Hip pain, bilateral 07/19/2012  . HLD (hyperlipidemia)   . HTN (hypertension)   . Hypokalemia 05/25/2013   Improved stopping HCTZ. Was noted to have an elevated glucose when K was low. Recheck renal next week after starting Maxzide  . Hypomagnesemia 08/10/2014  . Hypothyroidism   . Nausea without vomiting 08/08/2014  . Osteoarthritis    chronic, right knee  . Other malaise and fatigue 01/27/2013  . Plantar fasciitis     Past Surgical History:  Procedure Laterality Date  . ABDOMINAL HYSTERECTOMY  1982  . ABDOMINAL HYSTERECTOMY    . CHOLECYSTECTOMY    . COLONOSCOPY    . ESOPHAGOGASTRODUODENOSCOPY    . KNEE ARTHROSCOPY  1998, 2007  . LEFT HEART CATHETERIZATION WITH CORONARY ANGIOGRAM N/A 11/21/2011   Procedure: LEFT HEART CATHETERIZATION WITH CORONARY ANGIOGRAM;   Surgeon: Josue Hector, MD;  Location: [facility] CATH LAB;  Service: Cardiovascular;  Laterality: N/A;  . TONSILLECTOMY  1975  . VESICOVAGINAL FISTULA CLOSURE W/ TAH  1995    Social History   Social History  . Marital status: Married    Spouse name: N/A  . Number of children: 1  . Years of education: N/A   Occupational History  . 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 Smoking status: Never Smoker  . Smokeless tobacco: Never Used  . Alcohol use No  . Drug use: No  . Sexual activity: No   Other Topics Concern  . Not on file   Social History Narrative   Multiple family members with intolerance to statins.    Current Outpatient Prescriptions on File Prior to Visit  Medication Sig Dispense Refill  . albuterol (PROVENTIL HFA;VENTOLIN HFA) 108 (90 BASE) MCG/ACT inhaler Inhale 2 puffs into the lungs every 6 (six) hours as needed for wheezing or shortness of breath. 1 Inhaler 0  . Alcohol Swabs (ALCOHOL PADS) 70 % PADS Use prior to checking blood sugars to clean skin    . aspirin 81 MG tablet Take 81 mg by mouth daily.      Marland Kitchen FARXIGA 10 MG TABS tablet TAKE 1 TABLET BY MOUTH EVERY DAY 90 tablet 3  . fenofibrate 160 MG tablet Take 1 tablet (160 mg total) by mouth daily.  90 tablet 2  . fluticasone (FLONASE) 50 MCG/ACT nasal spray Place into both nostrils daily.    . Fluticasone-Salmeterol (ADVAIR DISKUS) 250-50 MCG/DOSE AEPB INHALE 1 PUFF INTO LUNGS 2 TIMES DAILY 3 each 3  . Glucosamine-Chondroitin (OSTEO BI-FLEX REGULAR STRENGTH) 250-200 MG TABS Take 1 tablet by mouth 2 (two) times daily.     . metFORMIN (GLUCOPHAGE-XR) 500 MG 24 hr tablet Take 2 tablets (1,000 mg total) by mouth daily. 180 tablet 4  . mometasone (NASONEX) 50 MCG/ACT nasal spray 2 sprays each nares q day (Patient taking differently: daily as needed. PRN) 17 g 2  . Multiple Vitamin (MULTIVITAMIN) tablet Take 1 tablet by mouth daily.    . niacin (NIASPAN) 500 MG CR tablet 1 tab po qhs with  lowfat snack and Aspirin 1/2 hour prior  Failed all statins and welchol (Patient taking differently: Take 500 mg by mouth at bedtime. 1 tab po qhs with lowfat snack and Aspirin 1/2 hour prior  Failed all statins and welchol) 30 tablet 5  . ONE TOUCH ULTRA TEST test strip USE AS DIRECTED ONCE A DAY AND AS NEEDED 100 each 5  . ONETOUCH DELICA LANCETS 99991111 MISC USE AS DIRECTED ONCE A DAY AND AS NEEDED 100 each 1  . pantoprazole (PROTONIX) 40 MG tablet Take 1 tablet (40 mg total) by mouth daily. 90 tablet 1  . pioglitazone (ACTOS) 30 MG tablet Take 1 tablet (30 mg total) by mouth daily. 90 tablet 3  . SYNTHROID 88 MCG tablet TAKE 1 TABLET DAILY 90 tablet 1  . triamterene-hydrochlorothiazide (MAXZIDE-25) 37.5-25 MG tablet TAKE 1/2 TABLET BY MOUTH EVERY DAY 45 tablet 3  . VICTOZA 18 MG/3ML SOPN INJECT 0.3 MLS (1.8 MG TOTAL) INTO THE SKIN DAILY. AND PEN NEEDLES 1/DAY 9 mL 1   No current facility-administered medications on file prior to visit.     Allergies  Allergen Reactions  . Amoxicillin   . Epinephrine Other (See Comments)    Pass out   . Simvastatin     REACTION: back ache  . Statins     myalgias  . Welchol [Colesevelam Hcl]     myalgia    Family History  Problem Relation Age of Onset  . Alzheimer's disease Mother   . Diabetes type II Mother   . Hiatal hernia Mother   . Diabetes Mother   . Emphysema Father   . Cancer Father     leukemia  . Colon cancer Neg Hx   . Depression Sister     suicide  . Diabetes Sister     BP 118/62   Pulse (!) 102   Ht 5\' 6"  (1.676 m)   Wt 159 lb (72.1 kg)   SpO2 96%   BMI 25.66 kg/m   Review of Systems Denies diarrhea.  She has gained a few lbs    Objective:   Physical Exam VITAL SIGNS:  See vs page GENERAL: no distress Pulses: dorsalis pedis intact bilat.   MSK: no deformity of the feet CV: no leg edema Skin:  no ulcer on the feet.  normal color and temp on the feet. Neuro: sensation is intact to touch on the  feet  A1c=6.8%     Assessment & Plan:  Type 2 DM: well-controlled

## 2016-02-23 NOTE — Patient Instructions (Addendum)
check your blood sugar once a day.  vary the time of day when you check, between before the 3 meals, and at bedtime.  also check if you have symptoms of your blood sugar being too high or too low.  please keep a record of the readings and bring it to your next appointment here.  You can write it on any piece of paper.  please call us sooner if your blood sugar goes below 70, or if you have a lot of readings over 200. Please continue the same medications for diabetes Please come back for a follow-up appointment in 4 months.    

## 2016-03-19 ENCOUNTER — Encounter: Payer: Self-pay | Admitting: Family Medicine

## 2016-03-24 ENCOUNTER — Other Ambulatory Visit: Payer: Self-pay | Admitting: Endocrinology

## 2016-04-03 ENCOUNTER — Other Ambulatory Visit: Payer: Self-pay | Admitting: Endocrinology

## 2016-04-09 ENCOUNTER — Encounter: Payer: BLUE CROSS/BLUE SHIELD | Admitting: Family Medicine

## 2016-04-12 ENCOUNTER — Other Ambulatory Visit: Payer: Self-pay | Admitting: Family Medicine

## 2016-04-15 ENCOUNTER — Ambulatory Visit (INDEPENDENT_AMBULATORY_CARE_PROVIDER_SITE_OTHER): Payer: BLUE CROSS/BLUE SHIELD | Admitting: Family Medicine

## 2016-04-15 ENCOUNTER — Encounter: Payer: Self-pay | Admitting: Family Medicine

## 2016-04-15 ENCOUNTER — Other Ambulatory Visit (HOSPITAL_COMMUNITY)
Admission: RE | Admit: 2016-04-15 | Discharge: 2016-04-15 | Disposition: A | Discharge status: Home / Self Care | Payer: BLUE CROSS/BLUE SHIELD | Source: Ambulatory Visit | Attending: Family Medicine | Admitting: Family Medicine

## 2016-04-15 VITALS — BP 120/68 | HR 90 | Temp 97.5°F | Ht 66.0 in | Wt 161.4 lb

## 2016-04-15 DIAGNOSIS — E1143 Type 2 diabetes mellitus with diabetic autonomic (poly)neuropathy: Secondary | ICD-10-CM

## 2016-04-15 DIAGNOSIS — K3184 Gastroparesis: Secondary | ICD-10-CM | POA: Diagnosis not present

## 2016-04-15 DIAGNOSIS — Z0000 Encounter for general adult medical examination without abnormal findings: Secondary | ICD-10-CM

## 2016-04-15 DIAGNOSIS — Z01419 Encounter for gynecological examination (general) (routine) without abnormal findings: Secondary | ICD-10-CM | POA: Insufficient documentation

## 2016-04-15 DIAGNOSIS — R252 Cramp and spasm: Secondary | ICD-10-CM | POA: Insufficient documentation

## 2016-04-15 DIAGNOSIS — E782 Mixed hyperlipidemia: Secondary | ICD-10-CM | POA: Diagnosis not present

## 2016-04-15 DIAGNOSIS — I10 Essential (primary) hypertension: Secondary | ICD-10-CM

## 2016-04-15 DIAGNOSIS — Z23 Encounter for immunization: Secondary | ICD-10-CM | POA: Diagnosis not present

## 2016-04-15 DIAGNOSIS — R740 Nonspecific elevation of levels of transaminase and lactic acid dehydrogenase [LDH]: Secondary | ICD-10-CM

## 2016-04-15 DIAGNOSIS — E039 Hypothyroidism, unspecified: Secondary | ICD-10-CM

## 2016-04-15 DIAGNOSIS — R7402 Elevation of levels of lactic acid dehydrogenase (LDH): Secondary | ICD-10-CM

## 2016-04-15 DIAGNOSIS — Z124 Encounter for screening for malignant neoplasm of cervix: Secondary | ICD-10-CM

## 2016-04-15 HISTORY — DX: Cramp and spasm: R25.2

## 2016-04-15 LAB — COMPREHENSIVE METABOLIC PANEL
ALBUMIN: 4.4 g/dL (ref 3.5–5.2)
ALK PHOS: 52 U/L (ref 39–117)
ALT: 17 U/L (ref 0–35)
AST: 17 U/L (ref 0–37)
BILIRUBIN TOTAL: 0.5 mg/dL (ref 0.2–1.2)
BUN: 22 mg/dL (ref 6–23)
CALCIUM: 9.7 mg/dL (ref 8.4–10.5)
CO2: 29 mEq/L (ref 19–32)
Chloride: 100 mEq/L (ref 96–112)
Creatinine, Ser: 1.08 mg/dL (ref 0.40–1.20)
GFR: 54.72 mL/min — AB (ref >60.00)
Glucose, Bld: 134 mg/dL — ABNORMAL HIGH (ref 70–99)
POTASSIUM: 3.7 meq/L (ref 3.5–5.1)
Sodium: 138 mEq/L (ref 135–145)
TOTAL PROTEIN: 7.1 g/dL (ref 6.0–8.3)

## 2016-04-15 LAB — CBC
HEMATOCRIT: 37.7 % (ref 36.0–46.0)
HEMOGLOBIN: 12.4 g/dL (ref 12.0–15.0)
MCHC: 32.8 g/dL (ref 30.0–36.0)
MCV: 82.4 fl (ref 78.0–100.0)
PLATELETS: 287 10*3/uL (ref 150.0–400.0)
RBC: 4.58 Mil/uL (ref 3.87–5.11)
RDW: 14.3 % (ref 11.5–15.5)
WBC: 7.8 10*3/uL (ref 4.0–10.5)

## 2016-04-15 LAB — LIPID PANEL
CHOLESTEROL: 211 mg/dL — AB (ref 0–200)
HDL: 70.1 mg/dL (ref >39.00)
LDL Cholesterol: 113 mg/dL — ABNORMAL HIGH (ref 0–99)
NonHDL: 141.27
TRIGLYCERIDES: 140 mg/dL (ref 0.0–149.0)
Total CHOL/HDL Ratio: 3
VLDL: 28 mg/dL (ref 0.0–40.0)

## 2016-04-15 LAB — TSH: TSH: 2.03 u[IU]/mL (ref 0.35–4.50)

## 2016-04-15 NOTE — Assessment & Plan Note (Signed)
Patient encouraged to maintain heart healthy diet, regular exercise, adequate sleep. Consider daily probiotics. Take medications as prescribed. Pap and labs today. Patient checked with her insurance company and they do pay for Zostavax will proceed today

## 2016-04-15 NOTE — Assessment & Plan Note (Addendum)
H/o now with leg cramps and she has stopped her magnesium supplements, she agrees to restart them while we check levels

## 2016-04-15 NOTE — Addendum Note (Signed)
Addended by: Sharon Seller B on: 04/15/2016 02:48 PM   Modules accepted: Orders

## 2016-04-15 NOTE — Progress Notes (Signed)
Patient ID: [name] [name], female   DOB: 02/25/1956, 60 y.o.   MRN: [id number]   Subjective:    Patient ID: [name] [name], female    DOB: 1956/01/27, 60 y.o.   MRN: [id number]  Chief Complaint  Patient presents with  . Annual Exam    HPI Patient is in today for annual preventative exam and pap smear. She had a hysterectomy at age 70 for precancerous ceslls so is anxious to continue annual paps. Otherwise she feels well except that she is having worsening night time leg cramps. No polyuria or polydipsia. Denies CP/palp/SOB/HA/congestion/fevers/GI or GU c/o. Taking meds as prescribedDoes note some recent leg cramps in hot weather.   Past Medical History:  Diagnosis Date  . Abdominal pain 08/10/2014  . Anterior neck pain 08/10/2014  . Asthma   . Atypical chest pain    normal coronaries and LV function by cath 10/2011  . Chronic pain disorder    neck and shoulder  . CTS (carpal tunnel syndrome) 01/27/2013   right  . Diabetes mellitus type II   . Diabetic gastroparesis ([place])   . Esophageal reflux 08/25/2013   [facility] [place], Dr Barth Kirks  . Fatty liver    abnormal transaminases; negative work up  . Fundic gland polyps of stomach, benign   . GERD (gastroesophageal reflux disease)   . Hip pain, bilateral 07/19/2012  . HLD (hyperlipidemia)   . HTN (hypertension)   . Hypokalemia 05/25/2013   Improved stopping HCTZ. Was noted to have an elevated glucose when K was low. Recheck renal next week after starting Maxzide  . Hypomagnesemia 08/10/2014  . Hypothyroidism   . Nausea without vomiting 08/08/2014  . Osteoarthritis    chronic, right knee  . Other malaise and fatigue 01/27/2013  . Plantar fasciitis   . Preventative health care 04/15/2016    Past Surgical History:  Procedure Laterality Date  . ABDOMINAL HYSTERECTOMY  1982  . ABDOMINAL HYSTERECTOMY    . CHOLECYSTECTOMY    . COLONOSCOPY    . ESOPHAGOGASTRODUODENOSCOPY    . KNEE ARTHROSCOPY  1998, 2007  . LEFT HEART CATHETERIZATION  WITH CORONARY ANGIOGRAM N/A 11/21/2011   Procedure: LEFT HEART CATHETERIZATION WITH CORONARY ANGIOGRAM;  Surgeon: Josue Hector, MD;  Location: [facility] CATH LAB;  Service: Cardiovascular;  Laterality: N/A;  . TONSILLECTOMY  1975  . VESICOVAGINAL FISTULA CLOSURE W/ TAH  1995    Family History  Problem Relation Age of Onset  . Alzheimer's disease Mother   . Diabetes type II Mother   . Hiatal hernia Mother   . Diabetes Mother   . Emphysema Father   . Cancer Father     leukemia  . Depression Sister     suicide  . Diabetes Sister   . Colon cancer Neg Hx     Social History   Social History  . Marital status: Married    Spouse name: N/A  . Number of children: 1  . Years of education: N/A   Occupational History  . 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 Smoking status: Never Smoker  . Smokeless tobacco: Never Used  . Alcohol use No  . Drug use: No  . Sexual activity: No   Other Topics Concern  . Not on file   Social History Narrative   Multiple family members with intolerance to statins.    Outpatient Medications Prior to Visit  Medication Sig Dispense Refill  . albuterol (PROVENTIL HFA;VENTOLIN HFA) 108 (90  BASE) MCG/ACT inhaler Inhale 2 puffs into the lungs every 6 (six) hours as needed for wheezing or shortness of breath. 1 Inhaler 0  . Alcohol Swabs (ALCOHOL PADS) 70 % PADS Use prior to checking blood sugars to clean skin    . aspirin 81 MG tablet Take 81 mg by mouth daily.      . B-D ULTRAFINE III SHORT PEN 31G X 8 MM MISC INJECT ONCE DAILY 100 each 3  . FARXIGA 10 MG TABS tablet TAKE 1 TABLET BY MOUTH EVERY DAY 90 tablet 3  . fenofibrate 160 MG tablet Take 1 tablet (160 mg total) by mouth daily. 90 tablet 2  . fluticasone (FLONASE) 50 MCG/ACT nasal spray Place into both nostrils daily.    . Fluticasone-Salmeterol (ADVAIR DISKUS) 250-50 MCG/DOSE AEPB INHALE 1 PUFF INTO LUNGS 2 TIMES DAILY 3 each 3  . Glucosamine-Chondroitin (OSTEO BI-FLEX  REGULAR STRENGTH) 250-200 MG TABS Take 1 tablet by mouth 2 (two) times daily.     . metFORMIN (GLUCOPHAGE-XR) 500 MG 24 hr tablet Take 2 tablets (1,000 mg total) by mouth daily. 180 tablet 4  . mometasone (NASONEX) 50 MCG/ACT nasal spray 2 sprays each nares q day (Patient taking differently: daily as needed. PRN) 17 g 2  . Multiple Vitamin (MULTIVITAMIN) tablet Take 1 tablet by mouth daily.    . niacin (NIASPAN) 500 MG CR tablet 1 tab po qhs with lowfat snack and Aspirin 1/2 hour prior  Failed all statins and welchol (Patient taking differently: Take 500 mg by mouth at bedtime. 1 tab po qhs with lowfat snack and Aspirin 1/2 hour prior  Failed all statins and welchol) 30 tablet 5  . ONE TOUCH ULTRA TEST test strip USE AS DIRECTED ONCE A DAY AND AS NEEDED 100 each 3  . ONETOUCH DELICA LANCETS 99991111 MISC USE AS DIRECTED ONCE A DAY AND AS NEEDED 100 each 1  . pantoprazole (PROTONIX) 40 MG tablet Take 1 tablet (40 mg total) by mouth daily. 90 tablet 1  . pioglitazone (ACTOS) 30 MG tablet Take 1 tablet (30 mg total) by mouth daily. 90 tablet 3  . SYNTHROID 88 MCG tablet TAKE 1 TABLET DAILY 90 tablet 1  . triamterene-hydrochlorothiazide (MAXZIDE-25) 37.5-25 MG tablet TAKE 1/2 TABLET BY MOUTH EVERY DAY 45 tablet 3  . VICTOZA 18 MG/3ML SOPN INJECT 1.8MG (0.3ML) DAILY FOR 90 DAYS 9 mL 3   No facility-administered medications prior to visit.     Allergies  Allergen Reactions  . Amoxicillin   . Epinephrine Other (See Comments)    Pass out   . Simvastatin     REACTION: back ache  . Statins     myalgias  . Welchol [Colesevelam Hcl]     myalgia    Review of Systems  Constitutional: Negative for chills, fever and malaise/fatigue.  HENT: Positive for sore throat. Negative for congestion and hearing loss.   Eyes: Negative for discharge.  Respiratory: Negative for cough, sputum production and shortness of breath.   Cardiovascular: Negative for chest pain, palpitations and leg swelling.    Gastrointestinal: Negative for abdominal pain, blood in stool, constipation, diarrhea, heartburn, nausea and vomiting.  Genitourinary: Negative for dysuria, frequency, hematuria and urgency.  Musculoskeletal: Positive for myalgias. Negative for back pain and falls.  Skin: Negative for rash.  Neurological: Negative for dizziness, sensory change, loss of consciousness, weakness and headaches.  Endo/Heme/Allergies: Negative for environmental allergies. Does not bruise/bleed easily.  Psychiatric/Behavioral: Negative for depression and suicidal ideas. The patient is not nervous/anxious  and does not have insomnia.        Objective:    Physical Exam  Constitutional: She is oriented to person, place, and time. She appears well-developed and well-nourished. No distress.  HENT:  Head: Normocephalic and atraumatic.  Eyes: Conjunctivae are normal.  Neck: Neck supple. No thyromegaly present.  Cardiovascular: Normal rate, regular rhythm and normal heart sounds.   No murmur heard. Pulmonary/Chest: Effort normal and breath sounds normal. No respiratory distress.  Abdominal: Soft. Bowel sounds are normal. She exhibits no distension and no mass. There is no tenderness.  Genitourinary: Vagina normal. No vaginal discharge found.  Genitourinary Comments: Uterus and cervix absent  Musculoskeletal: She exhibits no edema.  Lymphadenopathy:    She has no cervical adenopathy.  Neurological: She is alert and oriented to person, place, and time.  Skin: Skin is warm and dry.  Psychiatric: She has a normal mood and affect. Her behavior is normal.    BP 120/68 (BP Location: Left Arm, Patient Position: Sitting, Cuff Size: Normal)   Pulse 90   Temp 97.5 F (36.4 C) (Oral)   Ht 5\' 6"  (1.676 m)   Wt 161 lb 6 oz (73.2 kg)   SpO2 98%   BMI 26.05 kg/m  Wt Readings from Last 3 Encounters:  04/15/16 161 lb 6 oz (73.2 kg)  02/23/16 159 lb (72.1 kg)  10/24/15 151 lb (68.5 kg)     Lab Results  Component  Value Date   WBC 7.0 06/01/2015   HGB 12.8 06/01/2015   HCT 39.0 06/01/2015   PLT 232.0 06/01/2015   GLUCOSE 201 (H) 06/01/2015   CHOL 236 (H) 06/01/2015   TRIG 262.0 (H) 06/01/2015   HDL 48.00 06/01/2015   LDLDIRECT 151.0 06/01/2015   LDLCALC 136 (H) 08/19/2013   ALT 26 06/01/2015   AST 22 06/01/2015   NA 139 06/01/2015   K 4.2 06/01/2015   CL 99 06/01/2015   CREATININE 1.06 06/01/2015   BUN 16 06/01/2015   CO2 30 06/01/2015   TSH 2.03 06/01/2015   INR 1.07 11/20/2011   HGBA1C 6.8 02/23/2016   MICROALBUR 0.7 06/01/2015    Lab Results  Component Value Date   TSH 2.03 06/01/2015   Lab Results  Component Value Date   WBC 7.0 06/01/2015   HGB 12.8 06/01/2015   HCT 39.0 06/01/2015   MCV 84.1 06/01/2015   PLT 232.0 06/01/2015   Lab Results  Component Value Date   NA 139 06/01/2015   K 4.2 06/01/2015   CO2 30 06/01/2015   GLUCOSE 201 (H) 06/01/2015   BUN 16 06/01/2015   CREATININE 1.06 06/01/2015   BILITOT 0.7 06/01/2015   ALKPHOS 83 06/01/2015   AST 22 06/01/2015   ALT 26 06/01/2015   PROT 7.2 06/01/2015   ALBUMIN 4.2 06/01/2015   CALCIUM 10.1 06/01/2015   ANIONGAP 15 11/28/2014   GFR 56.07 (L) 06/01/2015   Lab Results  Component Value Date   CHOL 236 (H) 06/01/2015   Lab Results  Component Value Date   HDL 48.00 06/01/2015   Lab Results  Component Value Date   LDLCALC 136 (H) 08/19/2013   Lab Results  Component Value Date   TRIG 262.0 (H) 06/01/2015   Lab Results  Component Value Date   CHOLHDL 5 06/01/2015   Lab Results  Component Value Date   HGBA1C 6.8 02/23/2016       Assessment & Plan:   Problem List Items Addressed This Visit    Hypothyroidism  On Levothyroxine, continue to monitor      Hyperlipidemia, mixed    Encouraged heart healthy diet, increase exercise, avoid trans fats, consider a krill oil cap daily      Relevant Orders   Lipid panel   NONSPEC ELEVATION OF LEVELS OF TRANSAMINASE/LDH    Check levels and minimiz  simple carbs in diet      Hypertension    Well controlled, no changes to meds. Encouraged heart healthy diet such as the DASH diet and exercise as tolerated.       Relevant Orders   CBC   Comprehensive metabolic panel   TSH   Cervical cancer screening    Pap today, no concerns on exam.       Relevant Orders   Cytology - PAP   Diabetic gastroparesis (HCC)    hgba1c acceptable, minimize simple carbs. Increase exercise as tolerated. Continue current meds. No GI concerns. Follows with endocrinology for management.       Hypomagnesemia    H/o now with leg cramps and she has stopped her magnesium supplements, she agrees to restart them while we check levels      Preventative health care    Patient encouraged to maintain heart healthy diet, regular exercise, adequate sleep. Consider daily probiotics. Take medications as prescribed. Pap and labs today. Patient checked with her insurance company and they do pay for Zostavax will proceed today         I am having Ms. Alban maintain her aspirin, Glucosamine-Chondroitin, Fluticasone-Salmeterol, multivitamin, Alcohol Pads, niacin, albuterol, mometasone, fluticasone, ONETOUCH DELICA LANCETS 99991111, metFORMIN, fenofibrate, FARXIGA, triamterene-hydrochlorothiazide, pioglitazone, pantoprazole, SYNTHROID, VICTOZA, B-D ULTRAFINE III SHORT PEN, and ONE TOUCH ULTRA TEST.  No orders of the defined types were placed in this encounter.    Penni Homans, MD

## 2016-04-15 NOTE — Assessment & Plan Note (Signed)
Recently flared again, encouraged increased hydration, potassium and magnesium check labs return if worsen

## 2016-04-15 NOTE — Patient Instructions (Signed)
Start Magnesium 50 mg and Hyland's leg cramp med.  Preventive Care 40-64 Years, Female Preventive care refers to lifestyle choices and visits with your health care provider that can promote health and wellness. What does preventive care include?  A yearly physical exam. This is also called an annual well check.  Dental exams once or twice a year.  Routine eye exams. Ask your health care provider how often you should have your eyes checked.  Personal lifestyle choices, including:  Daily care of your teeth and gums.  Regular physical activity.  Eating a healthy diet.  Avoiding tobacco and drug use.  Limiting alcohol use.  Practicing safe sex.  Taking low-dose aspirin daily starting at age 54.  Taking vitamin and mineral supplements as recommended by your health care provider. What happens during an annual well check? The services and screenings done by your health care provider during your annual well check will depend on your age, overall health, lifestyle risk factors, and family history of disease. Counseling  Your health care provider may ask you questions about your:  Alcohol use.  Tobacco use.  Drug use.  Emotional well-being.  Home and relationship well-being.  Sexual activity.  Eating habits.  Work and work Statistician.  Method of birth control.  Menstrual cycle.  Pregnancy history. Screening  You may have the following tests or measurements:  Height, weight, and BMI.  Blood pressure.  Lipid and cholesterol levels. These may be checked every 5 years, or more frequently if you are over 63 years old.  Skin check.  Lung cancer screening. You may have this screening every year starting at age 34 if you have a 30-pack-year history of smoking and currently smoke or have quit within the past 15 years.  Fecal occult blood test (FOBT) of the stool. You may have this test every year starting at age 24.  Flexible sigmoidoscopy or colonoscopy. You may  have a sigmoidoscopy every 5 years or a colonoscopy every 10 years starting at age 20.  Hepatitis C blood test.  Hepatitis B blood test.  Sexually transmitted disease (STD) testing.  Diabetes screening. This is done by checking your blood sugar (glucose) after you have not eaten for a while (fasting). You may have this done every 1-3 years.  Mammogram. This may be done every 1-2 years. Talk to your health care provider about when you should start having regular mammograms. This may depend on whether you have a family history of breast cancer.  BRCA-related cancer screening. This may be done if you have a family history of breast, ovarian, tubal, or peritoneal cancers.  Pelvic exam and Pap test. This may be done every 3 years starting at age 76. Starting at age 89, this may be done every 5 years if you have a Pap test in combination with an HPV test.  Bone density scan. This is done to screen for osteoporosis. You may have this scan if you are at high risk for osteoporosis. Discuss your test results, treatment options, and if necessary, the need for more tests with your health care provider. Vaccines  Your health care provider may recommend certain vaccines, such as:  Influenza vaccine. This is recommended every year.  Tetanus, diphtheria, and acellular pertussis (Tdap, Td) vaccine. You may need a Td booster every 10 years.  Varicella vaccine. You may need this if you have not been vaccinated.  Zoster vaccine. You may need this after age 1.  Measles, mumps, and rubella (MMR) vaccine. You may need  at least one dose of MMR if you were born in 1957 or later. You may also need a second dose.  Pneumococcal 13-valent conjugate (PCV13) vaccine. You may need this if you have certain conditions and were not previously vaccinated.  Pneumococcal polysaccharide (PPSV23) vaccine. You may need one or two doses if you smoke cigarettes or if you have certain conditions.  Meningococcal vaccine. You  may need this if you have certain conditions.  Hepatitis A vaccine. You may need this if you have certain conditions or if you travel or work in places where you may be exposed to hepatitis A.  Hepatitis B vaccine. You may need this if you have certain conditions or if you travel or work in places where you may be exposed to hepatitis B.  Haemophilus influenzae type b (Hib) vaccine. You may need this if you have certain conditions. Talk to your health care provider about which screenings and vaccines you need and how often you need them. This information is not intended to replace advice given to you by your health care provider. Make sure you discuss any questions you have with your health care provider. Document Released: 06/09/2015 Document Revised: 01/31/2016 Document Reviewed: 03/14/2015 Elsevier Interactive Patient Education  2017 [employer].

## 2016-04-15 NOTE — Assessment & Plan Note (Signed)
On Levothyroxine, continue to monitor 

## 2016-04-15 NOTE — Assessment & Plan Note (Signed)
Pap today, no concerns on exam.  

## 2016-04-15 NOTE — Progress Notes (Signed)
Pre visit review using our clinic review tool, if applicable. No additional management support is needed unless otherwise documented below in the visit note. 

## 2016-04-15 NOTE — Assessment & Plan Note (Signed)
Doing well at present time. No changes. She thinks it might be approaching 10 years since her last colonoscopy, will request records from Dr [employer] office and she will report any concerning symptoms

## 2016-04-15 NOTE — Assessment & Plan Note (Signed)
Well controlled, no changes to meds. Encouraged heart healthy diet such as the DASH diet and exercise as tolerated.  °

## 2016-04-15 NOTE — Assessment & Plan Note (Signed)
Check levels and minimiz simple carbs in diet

## 2016-04-15 NOTE — Assessment & Plan Note (Signed)
Encouraged heart healthy diet, increase exercise, avoid trans fats, consider a krill oil cap daily 

## 2016-04-15 NOTE — Assessment & Plan Note (Addendum)
hgba1c acceptable, minimize simple carbs. Increase exercise as tolerated. Continue current meds. No GI concerns. Follows with endocrinology for management.

## 2016-04-16 LAB — CYTOLOGY - PAP: Diagnosis: NEGATIVE

## 2016-04-27 ENCOUNTER — Other Ambulatory Visit: Payer: Self-pay | Admitting: Family Medicine

## 2016-05-17 ENCOUNTER — Other Ambulatory Visit: Payer: Self-pay | Admitting: Family Medicine

## 2016-06-12 ENCOUNTER — Other Ambulatory Visit: Payer: Self-pay | Admitting: Family Medicine

## 2016-06-24 ENCOUNTER — Encounter: Payer: Self-pay | Admitting: Endocrinology

## 2016-06-24 ENCOUNTER — Ambulatory Visit (INDEPENDENT_AMBULATORY_CARE_PROVIDER_SITE_OTHER): Payer: BLUE CROSS/BLUE SHIELD | Admitting: Endocrinology

## 2016-06-24 VITALS — BP 116/80 | HR 93 | Ht 66.0 in | Wt 163.0 lb

## 2016-06-24 DIAGNOSIS — E119 Type 2 diabetes mellitus without complications: Secondary | ICD-10-CM | POA: Diagnosis not present

## 2016-06-24 LAB — POCT GLYCOSYLATED HEMOGLOBIN (HGB A1C): HEMOGLOBIN A1C: 7

## 2016-06-24 MED ORDER — REPAGLINIDE 0.5 MG PO TABS: 0.5000 mg | ORAL_TABLET | Freq: Every day | ORAL | 11 refills | Status: DC

## 2016-06-24 NOTE — Progress Notes (Signed)
Subjective:    Patient ID: [name] [name], female    DOB: [birth year], 61 y.o.   MRN: [id number]  HPI Pt returns for f/u of diabetes mellitus: DM type: 2 Dx'ed: 123456 Complications: gastgroparesis Therapy: 3 oral meds and victoza.  GDM: never DKA: never.  Severe hypoglycemia: never.  Pancreatitis: never.  Other: she has never been on insulin; she declines to take prandin or starlix; diarrhea limits metformin dosage; she did not tolerate parlodel (vomiting).   Interval history: no cbg record, but states cbg's are well-controlled.  pt states she feels well in general, except nausea has recurred, despite no recent change in victoza.     Past Medical History:  Diagnosis Date  . Abdominal pain 08/10/2014  . Anterior neck pain 08/10/2014  . Asthma   . Atypical chest pain    normal coronaries and LV function by cath 10/2011  . Chronic pain disorder    neck and shoulder  . CTS (carpal tunnel syndrome) 01/27/2013   right  . Diabetes mellitus type II   . Diabetic gastroparesis ([place])   . Esophageal reflux 08/25/2013   [facility], Dr Barth Kirks  . Fatty liver    abnormal transaminases; negative work up  . Fundic gland polyps of stomach, benign   . GERD (gastroesophageal reflux disease)   . Hip pain, bilateral 07/19/2012  . HLD (hyperlipidemia)   . HTN (hypertension)   . Hypokalemia 05/25/2013   Improved stopping HCTZ. Was noted to have an elevated glucose when K was low. Recheck renal next week after starting Maxzide  . Hypomagnesemia 08/10/2014  . Hypothyroidism   . Leg cramps 04/15/2016  . Nausea without vomiting 08/08/2014  . Osteoarthritis    chronic, right knee  . Other malaise and fatigue 01/27/2013  . Plantar fasciitis   . Preventative health care 04/15/2016    Past Surgical History:  Procedure Laterality Date  . ABDOMINAL HYSTERECTOMY  1982  . ABDOMINAL HYSTERECTOMY    . CHOLECYSTECTOMY    . COLONOSCOPY    . ESOPHAGOGASTRODUODENOSCOPY    . KNEE ARTHROSCOPY  1998, 2007   . LEFT HEART CATHETERIZATION WITH CORONARY ANGIOGRAM N/A 11/21/2011   Procedure: LEFT HEART CATHETERIZATION WITH CORONARY ANGIOGRAM;  Surgeon: Josue Hector, MD;  Location: [facility] CATH LAB;  Service: Cardiovascular;  Laterality: N/A;  . TONSILLECTOMY  1975  . VESICOVAGINAL FISTULA CLOSURE W/ TAH  1995    Social History   Social History  . Marital status: Married    Spouse name: N/A  . Number of children: 1  . Years of education: N/A   Occupational History  . 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 Smoking status: Never Smoker  . Smokeless tobacco: Never Used  . Alcohol use No  . Drug use: No  . Sexual activity: No   Other Topics Concern  . Not on file   Social History Narrative   Multiple family members with intolerance to statins.    Current Outpatient Prescriptions on File Prior to Visit  Medication Sig Dispense Refill  . Alcohol Swabs (ALCOHOL PADS) 70 % PADS Use prior to checking blood sugars to clean skin    . aspirin 81 MG tablet Take 81 mg by mouth daily.      . B-D ULTRAFINE III SHORT PEN 31G X 8 MM MISC INJECT ONCE DAILY 100 each 3  . FARXIGA 10 MG TABS tablet TAKE 1 TABLET BY MOUTH EVERY DAY 90 tablet 3  . fenofibrate  160 MG tablet TAKE 1 TABLET (160 MG TOTAL) BY MOUTH DAILY. 90 tablet 2  . fluticasone (FLONASE) 50 MCG/ACT nasal spray Place into both nostrils daily.    . Fluticasone-Salmeterol (ADVAIR DISKUS) 250-50 MCG/DOSE AEPB INHALE 1 PUFF INTO LUNGS 2 TIMES DAILY 3 each 3  . Glucosamine-Chondroitin (OSTEO BI-FLEX REGULAR STRENGTH) 250-200 MG TABS Take 1 tablet by mouth 2 (two) times daily.     . metFORMIN (GLUCOPHAGE-XR) 500 MG 24 hr tablet Take 2 tablets (1,000 mg total) by mouth daily. 180 tablet 4  . mometasone (NASONEX) 50 MCG/ACT nasal spray 2 sprays each nares q day (Patient taking differently: daily as needed. PRN) 17 g 2  . Multiple Vitamin (MULTIVITAMIN) tablet Take 1 tablet by mouth daily.    . niacin (NIASPAN) 500 MG CR  tablet 1 tab po qhs with lowfat snack and Aspirin 1/2 hour prior  Failed all statins and welchol (Patient taking differently: Take 500 mg by mouth at bedtime. 1 tab po qhs with lowfat snack and Aspirin 1/2 hour prior  Failed all statins and welchol) 30 tablet 5  . ONE TOUCH ULTRA TEST test strip USE AS DIRECTED ONCE A DAY AND AS NEEDED 100 each 3  . ONETOUCH DELICA LANCETS 99991111 MISC USE AS DIRECTED ONCE A DAY AND AS NEEDED 100 each 1  . pantoprazole (PROTONIX) 40 MG tablet TAKE 1 TABLET DAILY 90 tablet 1  . pioglitazone (ACTOS) 30 MG tablet Take 1 tablet (30 mg total) by mouth daily. 90 tablet 3  . PROAIR HFA 108 (90 Base) MCG/ACT inhaler INHALE 2 PUFFS INTO THE LUNGS EVERY 6 (SIX) HOURS AS NEEDED FOR WHEEZING OR SHORTNESS OF BREATH. 8.5 Inhaler 0  . SYNTHROID 88 MCG tablet TAKE 1 TABLET DAILY 90 tablet 1  . triamterene-hydrochlorothiazide (MAXZIDE-25) 37.5-25 MG tablet TAKE 1/2 TABLET BY MOUTH EVERY DAY 45 tablet 3  . VICTOZA 18 MG/3ML SOPN INJECT 1.8MG (0.3ML) DAILY FOR 90 DAYS 9 mL 3   No current facility-administered medications on file prior to visit.     Allergies  Allergen Reactions  . Amoxicillin   . Epinephrine Other (See Comments)    Pass out   . Simvastatin     REACTION: back ache  . Statins     myalgias  . Welchol [Colesevelam Hcl]     myalgia    Family History  Problem Relation Age of Onset  . Alzheimer's disease Mother   . Diabetes type II Mother   . Hiatal hernia Mother   . Diabetes Mother   . Emphysema Father   . Cancer Father     leukemia  . Depression Sister     suicide  . Diabetes Sister   . Colon cancer Neg Hx     BP 116/80   Pulse 93   Ht 5\' 6"  (1.676 m)   Wt 163 lb (73.9 kg)   SpO2 96%   BMI 26.31 kg/m    Review of Systems No weight change    Objective:   Physical Exam VITAL SIGNS:  See vs page GENERAL: no distress Pulses: dorsalis pedis intact bilat.   MSK: no deformity of the feet CV: no leg edema.   Skin:  no ulcer on the feet.   normal color and temp on the feet.  Neuro: sensation is intact to touch on the feet.    A1c=7.0%.   Lab Results  Component Value Date   CREATININE 1.08 04/15/2016   BUN 22 04/15/2016   NA 138 04/15/2016  K 3.7 04/15/2016   CL 100 04/15/2016   CO2 29 04/15/2016      Assessment & Plan:  Type 2 DM: she needs increased rx, if it can be done with a regimen that avoids or minimizes hypoglycemia.   Nausea, worse,possibly due to DM meds.  Patient is advised the following: Patient Instructions  check your blood sugar once a day.  vary the time of day when you check, between before the 3 meals, and at bedtime.  also check if you have symptoms of your blood sugar being too high or too low.  please keep a record of the readings and bring it to your next appointment here.  You can write it on any piece of paper.  please call us sooner if your blood sugar goes below 70, or if you have a lot of readings over 200.   I have sent a prescription to your pharmacy, to add " repaglinide."  We can increase this as needed. For the nausea, you could try decreasing the victoza.  This would leave you needing more repaglinide, but that is fine.    Please come back for a follow-up appointment in 4 months.

## 2016-06-24 NOTE — Patient Instructions (Addendum)
check your blood sugar once a day.  vary the time of day when you check, between before the 3 meals, and at bedtime.  also check if you have symptoms of your blood sugar being too high or too low.  please keep a record of the readings and bring it to your next appointment here.  You can write it on any piece of paper.  please call us sooner if your blood sugar goes below 70, or if you have a lot of readings over 200.   I have sent a prescription to your pharmacy, to add " repaglinide."  We can increase this as needed. For the nausea, you could try decreasing the victoza.  This would leave you needing more repaglinide, but that is fine.    Please come back for a follow-up appointment in 4 months.

## 2016-07-02 ENCOUNTER — Ambulatory Visit (INDEPENDENT_AMBULATORY_CARE_PROVIDER_SITE_OTHER): Payer: BLUE CROSS/BLUE SHIELD | Admitting: Nurse Practitioner

## 2016-07-02 ENCOUNTER — Encounter: Payer: Self-pay | Admitting: Nurse Practitioner

## 2016-07-02 ENCOUNTER — Other Ambulatory Visit (INDEPENDENT_AMBULATORY_CARE_PROVIDER_SITE_OTHER): Payer: BLUE CROSS/BLUE SHIELD

## 2016-07-02 VITALS — BP 110/70 | HR 84 | Ht 66.0 in | Wt 164.0 lb

## 2016-07-02 DIAGNOSIS — K3184 Gastroparesis: Secondary | ICD-10-CM

## 2016-07-02 DIAGNOSIS — R140 Abdominal distension (gaseous): Secondary | ICD-10-CM

## 2016-07-02 DIAGNOSIS — E1143 Type 2 diabetes mellitus with diabetic autonomic (poly)neuropathy: Secondary | ICD-10-CM

## 2016-07-02 LAB — CBC WITH DIFFERENTIAL/PLATELET
BASOS PCT: 0.8 % (ref 0.0–3.0)
Basophils Absolute: 0.1 10*3/uL (ref 0.0–0.1)
EOS PCT: 1.9 % (ref 0.0–5.0)
Eosinophils Absolute: 0.1 10*3/uL (ref 0.0–0.7)
HCT: 37.9 % (ref 36.0–46.0)
HEMOGLOBIN: 12.4 g/dL (ref 12.0–15.0)
LYMPHS ABS: 1.8 10*3/uL (ref 0.7–4.0)
Lymphocytes Relative: 24.8 % (ref 12.0–46.0)
MCHC: 32.8 g/dL (ref 30.0–36.0)
MCV: 81 fl (ref 78.0–100.0)
MONO ABS: 0.6 10*3/uL (ref 0.1–1.0)
Monocytes Relative: 7.8 % (ref 3.0–12.0)
NEUTROS PCT: 64.7 % (ref 43.0–77.0)
Neutro Abs: 4.6 10*3/uL (ref 1.4–7.7)
Platelets: 280 10*3/uL (ref 150.0–400.0)
RBC: 4.67 Mil/uL (ref 3.87–5.11)
RDW: 13.9 % (ref 11.5–15.5)
WBC: 7.1 10*3/uL (ref 4.0–10.5)

## 2016-07-02 LAB — COMPREHENSIVE METABOLIC PANEL
ALBUMIN: 4.4 g/dL (ref 3.5–5.2)
ALT: 16 U/L (ref 0–35)
AST: 16 U/L (ref 0–37)
Alkaline Phosphatase: 47 U/L (ref 39–117)
BUN: 19 mg/dL (ref 6–23)
CO2: 32 mEq/L (ref 19–32)
Calcium: 9.8 mg/dL (ref 8.4–10.5)
Chloride: 102 mEq/L (ref 96–112)
Creatinine, Ser: 1.06 mg/dL (ref 0.40–1.20)
GFR: 55.87 mL/min — AB (ref >60.00)
Glucose, Bld: 135 mg/dL — ABNORMAL HIGH (ref 70–99)
POTASSIUM: 4 meq/L (ref 3.5–5.1)
SODIUM: 140 meq/L (ref 135–145)
Total Bilirubin: 0.4 mg/dL (ref 0.2–1.2)
Total Protein: 7.3 g/dL (ref 6.0–8.3)

## 2016-07-02 MED ORDER — METOCLOPRAMIDE HCL 10 MG PO TABS: 10.0000 mg | ORAL_TABLET | Freq: Two times a day (BID) | ORAL | 0 refills | Status: DC

## 2016-07-02 NOTE — Progress Notes (Addendum)
HPI:  Patient 61 year old female known  to Dr. Carlean Purl.  She has a hx of  irritable bowel syndrome and diabetic gastroparesis. She was evaluated last year for increasing postprandial bloating and regurgitation despite Reglan 10 mg twice a day. She was advised to increase reglan to [facility] and HS. She cannot recall taking Reglan/metoclopramide at any point in time. She is in now with a two-week history of severe nausea which starts in the morning and usually lasts until late afternoon. She has bloating despite decreased PO intake. Recently started on Prandin but nausea preceded this. She does not take NSAIDs. No recent dietary changes. She takes Protonix, no breakthrough GERD symptoms. She sleeps in elevated position. Patient does take a lot of medications at bedtime but this has always been the case. Her blood sugars been well controlled ranging in the 150s. Her last A1c was only 7. Patient has no bowel changes. No fevers.  Normal LFTs in November   Past Medical History:  Diagnosis Date  . Abdominal pain 08/10/2014  . Anterior neck pain 08/10/2014  . Asthma   . Atypical chest pain    normal coronaries and LV function by cath 10/2011  . Chronic pain disorder    neck and shoulder  . CTS (carpal tunnel syndrome) 01/27/2013   right  . Diabetes mellitus type II   . Diabetic gastroparesis ([place])   . Esophageal reflux 08/25/2013   [facility], Dr Barth Kirks  . Fatty liver    abnormal transaminases; negative work up  . Fundic gland polyps of stomach, benign   . GERD (gastroesophageal reflux disease)   . Hip pain, bilateral 07/19/2012  . HLD (hyperlipidemia)   . HTN (hypertension)   . Hypokalemia 05/25/2013   Improved stopping HCTZ. Was noted to have an elevated glucose when K was low. Recheck renal next week after starting Maxzide  . Hypomagnesemia 08/10/2014  . Hypothyroidism   . Leg cramps 04/15/2016  . Nausea without vomiting 08/08/2014  . Osteoarthritis    chronic, right knee  . Other  malaise and fatigue 01/27/2013  . Plantar fasciitis   . Preventative health care 04/15/2016    Patient's surgical history, family medical history, social history, medications and allergies were all reviewed in Epic    Physical Exam: BP 110/70   Pulse 84   Ht 5\' 6"  (1.676 m)   Wt 164 lb (74.4 kg)   BMI 26.47 kg/m   GENERAL: Pleasant well-developed white female in NAD PSYCH: :Pleasant, cooperative, normal affect HEENT: Normocephalic, conjunctiva pink, mucous membranes moist, neck supple without masses CARDIAC:  RRR,no murmur heard, no peripheral edema PULM: Normal respiratory effort, lungs CTA bilaterally, no wheezing ABDOMEN:  soft, nontender, nondistended, no obvious masses, no hepatomegaly,  normal bowel sounds. No succussion splash  SKIN:  turgor, no lesions seen Musculoskeletal:  Normal muscle tone, strength, normal strength NEURO: Alert and oriented x 3, no focal neurologic deficits  ASSESSMENT and PLAN:   63 -year-old female with a two-week history of severe nausea and bloating without weight loss. She has a history of diabetic gastroparesis but blood sugars have been fairly well controlled.  No recent illness to suggest post-infectious IBS.  -We treated her with Reglan last year but patient has no recollection of taking it. Will try Reglan 10 mg daily at bedtime and before breakfast. We discussed potential side effects of Reglan such as involuntary muscle movements and she will stop medication immediately should this occur.  -Continue small frequent meals -Continue  to maintain good glucose control -No labs in a couple of months so given these new symptoms will check some basic labs and call her with results.  -Follow-up with primary GI, Dr. Ammie Ferrier, in a couple of months. If symptoms do not improve, or certainly if they worsen patient will call our office for further recommendations       Tye Savoy , NP 07/02/2016, 9:07 AM  Agree with Ms. Vanita Ingles assessment and  plan. Gatha Mayer, MD, [facility]

## 2016-07-02 NOTE — Patient Instructions (Signed)
If you are age 62 or older, your body mass index should be between 23-30. Your Body mass index is 26.47 kg/m. If this is out of the aforementioned range listed, please consider follow up with your Primary Care Provider.  If you are age 4 or younger, your body mass index should be between 19-25. Your Body mass index is 26.47 kg/m. If this is out of the aformentioned range listed, please consider follow up with your Primary Care Provider.   Your physician has requested that you go to the basement for the following lab work before leaving today:  CBC, CMET  We have sent the following medications to your pharmacy for you to pick up at your convenience:  Reglan  Per Nevin Bloodgood please try eating small frequent meals.  Please follow up with Nevin Bloodgood in 2 months. This appointment is scheduled for 08/05/16 at 9:30am. Call if symptoms are no better.  Thank you.

## 2016-07-09 ENCOUNTER — Encounter: Payer: Self-pay | Admitting: Nurse Practitioner

## 2016-07-31 ENCOUNTER — Other Ambulatory Visit: Payer: Self-pay | Admitting: Gastroenterology

## 2016-07-31 NOTE — Telephone Encounter (Signed)
[name] [name], Are you ok to refill Reglan for pt?

## 2016-08-05 ENCOUNTER — Ambulatory Visit: Payer: BLUE CROSS/BLUE SHIELD | Admitting: Nurse Practitioner

## 2016-08-06 ENCOUNTER — Other Ambulatory Visit: Payer: Self-pay | Admitting: Family Medicine

## 2016-08-09 ENCOUNTER — Encounter: Payer: Self-pay | Admitting: *Deleted

## 2016-08-09 ENCOUNTER — Emergency Department
Admission: EM | Admit: 2016-08-09 | Discharge: 2016-08-09 | Disposition: A | Discharge status: Home / Self Care | Payer: BLUE CROSS/BLUE SHIELD | Source: Home / Self Care | Attending: Family Medicine | Admitting: Family Medicine

## 2016-08-09 DIAGNOSIS — J069 Acute upper respiratory infection, unspecified: Secondary | ICD-10-CM

## 2016-08-09 DIAGNOSIS — B9789 Other viral agents as the cause of diseases classified elsewhere: Secondary | ICD-10-CM | POA: Diagnosis not present

## 2016-08-09 LAB — POCT RAPID STREP A (OFFICE): Rapid Strep A Screen: NEGATIVE

## 2016-08-09 MED ORDER — PREDNISONE 20 MG PO TABS: ORAL_TABLET | ORAL | 0 refills | Status: DC

## 2016-08-09 MED ORDER — DOXYCYCLINE HYCLATE 100 MG PO CAPS: 100.0000 mg | ORAL_CAPSULE | Freq: Two times a day (BID) | ORAL | 0 refills | Status: DC

## 2016-08-09 NOTE — Discharge Instructions (Signed)
Take plain guaifenesin (1200mg  extended release tabs such as Mucinex) twice daily, with plenty of water, for cough and congestion.  Get adequate rest.   May use Afrin nasal spray (or generic oxymetazoline) each morning for about 5 days and then discontinue.  Also recommend using saline nasal spray several times daily and saline nasal irrigation (AYR is a common brand).  Use Flonase nasal spray each morning after using Afrin nasal spray and saline nasal irrigation. Try warm salt water gargles for sore throat.  Continue albuterol inhaler as needed. Stop all antihistamines for now, and other non-prescription cough/cold preparations. May take Delsym Cough Suppressant at bedtime for nighttime cough.  Begin Doxycycline if not improving about one week or if persistent fever develops   Follow-up with family doctor if not improving about10 days.

## 2016-08-09 NOTE — ED Provider Notes (Signed)
[facility]    CSN: [id number] Arrival date & time: 08/09/16  0801     History   Chief Complaint Chief Complaint  Patient presents with  . Nasal Congestion  . Sore Throat    HPI [name] [name] is a 61 y.o. female.   Patient complains of three day history of sinus congestion.  Last night she developed a sore throat and increased fatigue.  She has been hoarse this morning.  She has an occasional cough.  No fevers, chills, and sweats.  Her husband has a similar condition. She has a history of seasonal rhinitis and mild asthma that rarely flares.  She has needed her albuterol inhaler this week.  She has a past history of pneumonia. She is diabetic; last Hgb A1c:  7.0    The history is provided by the patient.    Past Medical History:  Diagnosis Date  . Abdominal pain 08/10/2014  . Anterior neck pain 08/10/2014  . Asthma   . Atypical chest pain    normal coronaries and LV function by cath 10/2011  . Chronic pain disorder    neck and shoulder  . CTS (carpal tunnel syndrome) 01/27/2013   right  . Diabetes mellitus type II   . Diabetic gastroparesis ([place])   . Esophageal reflux 08/25/2013   [facility] [place], Dr Barth Kirks  . Fatty liver    abnormal transaminases; negative work up  . Fundic gland polyps of stomach, benign   . GERD (gastroesophageal reflux disease)   . Hip pain, bilateral 07/19/2012  . HLD (hyperlipidemia)   . HTN (hypertension)   . Hypokalemia 05/25/2013   Improved stopping HCTZ. Was noted to have an elevated glucose when K was low. Recheck renal next week after starting Maxzide  . Hypomagnesemia 08/10/2014  . Hypothyroidism   . Leg cramps 04/15/2016  . Nausea without vomiting 08/08/2014  . Osteoarthritis    chronic, right knee  . Other malaise and fatigue 01/27/2013  . Plantar fasciitis   . Preventative health care 04/15/2016    Patient Active Problem List   Diagnosis Date Noted  . Preventative health care 04/15/2016  . Leg cramps 04/15/2016   . Focal muscle atrophy 09/26/2015  . Hypomagnesemia 08/10/2014  . Anterior neck pain 08/10/2014  . Pain of both thighs 08/08/2014  . Diabetic gastroparesis ([place]) 07/19/2014  . Esophageal reflux 08/25/2013  . Hypokalemia 05/25/2013  . Other malaise and fatigue 01/27/2013  . CTS (carpal tunnel syndrome) 01/27/2013  . Cervical cancer screening 10/27/2012  . Hip pain, bilateral 07/19/2012  . Asthma with acute exacerbation 04/27/2011  . Allergic rhinitis 08/24/2010  . Hypertension 08/24/2010  . OVARIAN CYST 07/11/2009  . BACK PAIN 03/01/2009  . Hyperlipidemia, mixed 05/04/2007  . NONSPEC ELEVATION OF LEVELS OF TRANSAMINASE/LDH 05/01/2007  . Hypothyroidism 04/27/2007  . FATTY LIVER DISEASE 04/27/2007    Past Surgical History:  Procedure Laterality Date  . ABDOMINAL HYSTERECTOMY  1982  . ABDOMINAL HYSTERECTOMY    . CHOLECYSTECTOMY    . COLONOSCOPY    . ESOPHAGOGASTRODUODENOSCOPY    . KNEE ARTHROSCOPY  1998, [id number]  . LEFT HEART CATHETERIZATION WITH CORONARY ANGIOGRAM N/A 11/21/2011   Procedure: LEFT HEART CATHETERIZATION WITH CORONARY ANGIOGRAM;  Surgeon: Josue Hector, MD;  Location: [facility] CATH LAB;  Service: Cardiovascular;  Laterality: N/A;  . TONSILLECTOMY  1975  . VESICOVAGINAL FISTULA CLOSURE W/ TAH  1995    OB History    No data available       Home Medications  Prior to Admission medications   Medication Sig Start Date End Date Taking? Authorizing Provider  FARXIGA 10 MG TABS tablet TAKE 1 TABLET BY MOUTH EVERY DAY 09/18/15  Yes Renato Shin, MD  fenofibrate 160 MG tablet TAKE 1 TABLET (160 MG TOTAL) BY MOUTH DAILY. 06/14/16  Yes Mosie Lukes, MD  metFORMIN (GLUCOPHAGE-XR) 500 MG 24 hr tablet Take 2 tablets (1,000 mg total) by mouth daily. 06/07/15  Yes Renato Shin, MD  pantoprazole (PROTONIX) 40 MG tablet TAKE 1 TABLET DAILY 05/17/16  Yes Mosie Lukes, MD  pioglitazone (ACTOS) 30 MG tablet Take 1 tablet (30 mg total) by mouth daily. 10/24/15  Yes Renato Shin,  MD  repaglinide (PRANDIN) 0.5 MG tablet Take 1 tablet (0.5 mg total) by mouth daily with supper. 06/24/16  Yes Renato Shin, MD  SYNTHROID 88 MCG tablet TAKE 1 TABLET DAILY 08/06/16  Yes Mosie Lukes, MD  triamterene-hydrochlorothiazide (MAXZIDE-25) 37.5-25 MG tablet TAKE 1/2 TABLET BY MOUTH EVERY DAY 09/18/15  Yes Renato Shin, MD  VICTOZA 18 MG/3ML SOPN INJECT 1.8MG (0.3ML) DAILY FOR 90 DAYS 03/24/16  Yes Renato Shin, MD  Alcohol Swabs (ALCOHOL PADS) 70 % PADS Use prior to checking blood sugars to clean skin 07/09/12   Historical Provider, MD  aspirin 81 MG tablet Take 81 mg by mouth daily.      Historical Provider, MD  B-D ULTRAFINE III SHORT PEN 31G X 8 MM MISC INJECT ONCE DAILY 04/03/16   Renato Shin, MD  doxycycline (VIBRAMYCIN) 100 MG capsule Take 1 capsule (100 mg total) by mouth 2 (two) times daily. Take with food (Rx void after 08/17/16) 08/09/16   Kandra Nicolas, MD  fluticasone (FLONASE) 50 MCG/ACT nasal spray Place into both nostrils daily.    Historical Provider, MD  Fluticasone-Salmeterol (ADVAIR DISKUS) 250-50 MCG/DOSE AEPB INHALE 1 PUFF INTO LUNGS 2 TIMES DAILY 04/09/12   Burnice Logan, MD  Glucosamine-Chondroitin (OSTEO BI-FLEX REGULAR STRENGTH) 250-200 MG TABS Take 1 tablet by mouth 2 (two) times daily.     Historical Provider, MD  metoCLOPramide (REGLAN) 10 MG tablet TAKE 1 TABLET EVERY MORNING AND TAKE 1 TABLET EVERY EVENING 08/02/16   Willia Craze, NP  mometasone (NASONEX) 50 MCG/ACT nasal spray 2 sprays each nares q day Patient taking differently: daily as needed. PRN 09/16/14   Mackie Pai, PA-C  Multiple Vitamin (MULTIVITAMIN) tablet Take 1 tablet by mouth daily.    Historical Provider, MD  niacin (NIASPAN) 500 MG CR tablet 1 tab po qhs with lowfat snack and Aspirin 1/2 hour prior  Failed all statins and welchol Patient taking differently: Take 500 mg by mouth at bedtime. 1 tab po qhs with lowfat snack and Aspirin 1/2 hour prior  Failed all statins and welchol 08/24/13    Mosie Lukes, MD  ONE TOUCH ULTRA TEST test strip USE AS DIRECTED ONCE A DAY AND AS NEEDED 04/12/16   Mosie Lukes, MD  [facility] DELICA LANCETS 50N MISC USE AS DIRECTED ONCE A DAY AND AS NEEDED 06/05/15   Mosie Lukes, MD  predniSONE (DELTASONE) 20 MG tablet Take one tab by mouth twice daily for 5 days, then one daily for 3 days. Take with food. 08/09/16   Kandra Nicolas, MD  PROAIR HFA 108 [phone] Base) MCG/ACT inhaler INHALE 2 PUFFS INTO THE LUNGS EVERY 6 (SIX) HOURS AS NEEDED FOR WHEEZING OR SHORTNESS OF BREATH. 04/29/16   Mosie Lukes, MD    Family History Family History  Problem Relation Age  of Onset  . Alzheimer's disease Mother   . Diabetes type II Mother   . Hiatal hernia Mother   . Diabetes Mother   . Emphysema Father   . Depression Sister     suicide  . Diabetes Sister   . Colon cancer Neg Hx   . Stomach cancer Neg Hx     Social History Social History  Substance Use Topics  . Smoking status: Never Smoker  . Smokeless tobacco: Never Used  . Alcohol use No     Allergies   Amoxicillin; Epinephrine; Simvastatin; Statins; and Welchol [colesevelam hcl]   Review of Systems Review of Systems + sore throat Minimal cough No pleuritic pain + wheezing + nasal congestion + post-nasal drainage No sinus pain/pressure No itchy/red eyes No earache No hemoptysis No SOB No fever/chills No nausea No vomiting No abdominal pain No diarrhea No urinary symptoms No skin rash + fatigue No myalgias No headache Used OTC meds without relief   Physical Exam Triage Vital Signs ED Triage Vitals  Enc Vitals Group     BP 08/09/16 0823 121/73     Pulse Rate 08/09/16 0823 91     Resp --      Temp 08/09/16 0823 97.8 F (36.6 C)     Temp Source 08/09/16 0823 Oral     SpO2 08/09/16 0823 98 %     Weight 08/09/16 0824 168 lb (76.2 kg)     Height --      Head Circumference --      Peak Flow --      Pain Score 08/09/16 0826 0     Pain Loc --      Pain Edu? --       Excl. in [place]? --    No data found.   Updated Vital Signs BP 121/73 (BP Location: Left Arm)   Pulse 91   Temp 97.8 F (36.6 C) (Oral)   Wt 168 lb (76.2 kg)   SpO2 98%   BMI 27.12 kg/m   Visual Acuity Right Eye Distance:   Left Eye Distance:   Bilateral Distance:    Right Eye Near:   Left Eye Near:    Bilateral Near:     Physical Exam Nursing notes and Vital Signs reviewed. Appearance:  Patient appears stated age, and in no acute distress Eyes:  Pupils are equal, round, and reactive to light and accomodation.  Extraocular movement is intact.  Conjunctivae are not inflamed  Ears:  Canals normal.  Tympanic membranes normal.  Nose:  Mildly congested turbinates.  No sinus tenderness.  Pharynx:  Uvula slightly edematous Neck:  Supple.  Tender enlarged posterior/lateral nodes are palpated bilaterally  Lungs:  Clear to auscultation.  Breath sounds are equal.  Moving air well. Heart:  Regular rate and rhythm without murmurs, rubs, or gallops.  Abdomen:  Nontender without masses or hepatosplenomegaly.  Bowel sounds are present.  No CVA or flank tenderness.  Extremities:  No edema.  Skin:  No rash present.    UC Treatments / Results  Labs (all labs ordered are listed, but only abnormal results are displayed) Labs Reviewed  POCT RAPID STREP A (OFFICE) negative    EKG  EKG Interpretation None       Radiology No results found.  Procedures Procedures (including critical care time)  Medications Ordered in UC Medications - No data to display   Initial Impression / Assessment and Plan / UC Course  I have reviewed the triage vital signs and  the nursing notes.  Pertinent labs & imaging results that were available during my care of the patient were reviewed by me and considered in my medical decision making (see chart for details).    There is no evidence of bacterial infection today.   Because of her history of asthma, will begin prednisone burst/taper Take plain  guaifenesin (1200mg  extended release tabs such as Mucinex) twice daily, with plenty of water, for cough and congestion.  Get adequate rest.   May use Afrin nasal spray (or generic oxymetazoline) each morning for about 5 days and then discontinue.  Also recommend using saline nasal spray several times daily and saline nasal irrigation (AYR is a common brand).  Use Flonase nasal spray each morning after using Afrin nasal spray and saline nasal irrigation. Try warm salt water gargles for sore throat.  Continue albuterol inhaler as needed. Stop all antihistamines for now, and other non-prescription cough/cold preparations. May take Delsym Cough Suppressant at bedtime for nighttime cough.  Begin Doxycycline if not improving about one week or if persistent fever develops (Given a prescription to hold, with an expiration date)  Follow-up with family doctor if not improving about10 days.     Final Clinical Impressions(s) / UC Diagnoses   Final diagnoses:  Viral URI with cough    New Prescriptions New Prescriptions   DOXYCYCLINE (VIBRAMYCIN) 100 MG CAPSULE    Take 1 capsule (100 mg total) by mouth 2 (two) times daily. Take with food (Rx void after 08/17/16)   PREDNISONE (DELTASONE) 20 MG TABLET    Take one tab by mouth twice daily for 5 days, then one daily for 3 days. Take with food.     Kandra Nicolas, MD 08/09/16 [phone]

## 2016-08-09 NOTE — ED Triage Notes (Signed)
Patient c/o  Symptoms x 1 week. Last night developed throat swelling and and woke up hoarse. Denies fever.

## 2016-08-10 ENCOUNTER — Other Ambulatory Visit: Payer: Self-pay | Admitting: Endocrinology

## 2016-08-18 ENCOUNTER — Encounter: Payer: Self-pay | Admitting: Endocrinology

## 2016-08-19 ENCOUNTER — Other Ambulatory Visit: Payer: Self-pay

## 2016-08-19 MED ORDER — REPAGLINIDE 0.5 MG PO TABS: 0.5000 mg | ORAL_TABLET | Freq: Every day | ORAL | 2 refills | Status: DC

## 2016-09-06 ENCOUNTER — Other Ambulatory Visit: Payer: Self-pay | Admitting: Endocrinology

## 2016-09-24 ENCOUNTER — Other Ambulatory Visit: Payer: Self-pay | Admitting: Endocrinology

## 2016-10-14 ENCOUNTER — Ambulatory Visit: Payer: BLUE CROSS/BLUE SHIELD | Admitting: Family Medicine

## 2016-10-22 ENCOUNTER — Ambulatory Visit (INDEPENDENT_AMBULATORY_CARE_PROVIDER_SITE_OTHER): Payer: BLUE CROSS/BLUE SHIELD | Admitting: Family Medicine

## 2016-10-22 ENCOUNTER — Ambulatory Visit: Payer: BLUE CROSS/BLUE SHIELD | Admitting: Endocrinology

## 2016-10-22 ENCOUNTER — Encounter: Payer: Self-pay | Admitting: Family Medicine

## 2016-10-22 DIAGNOSIS — E782 Mixed hyperlipidemia: Secondary | ICD-10-CM

## 2016-10-22 DIAGNOSIS — I10 Essential (primary) hypertension: Secondary | ICD-10-CM

## 2016-10-22 DIAGNOSIS — E6609 Other obesity due to excess calories: Secondary | ICD-10-CM | POA: Diagnosis not present

## 2016-10-22 DIAGNOSIS — L719 Rosacea, unspecified: Secondary | ICD-10-CM | POA: Diagnosis not present

## 2016-10-22 DIAGNOSIS — E669 Obesity, unspecified: Secondary | ICD-10-CM | POA: Insufficient documentation

## 2016-10-22 DIAGNOSIS — E039 Hypothyroidism, unspecified: Secondary | ICD-10-CM | POA: Diagnosis not present

## 2016-10-22 DIAGNOSIS — E1143 Type 2 diabetes mellitus with diabetic autonomic (poly)neuropathy: Secondary | ICD-10-CM

## 2016-10-22 HISTORY — DX: Rosacea, unspecified: L71.9

## 2016-10-22 LAB — HEMOGLOBIN A1C: HEMOGLOBIN A1C: 7.5 % — AB (ref 4.6–6.5)

## 2016-10-22 LAB — TSH: TSH: 3.28 u[IU]/mL (ref 0.35–4.50)

## 2016-10-22 LAB — COMPREHENSIVE METABOLIC PANEL
ALBUMIN: 4.4 g/dL (ref 3.5–5.2)
ALT: 17 U/L (ref 0–35)
AST: 19 U/L (ref 0–37)
Alkaline Phosphatase: 42 U/L (ref 39–117)
BILIRUBIN TOTAL: 0.4 mg/dL (ref 0.2–1.2)
BUN: 25 mg/dL — ABNORMAL HIGH (ref 6–23)
CALCIUM: 9.9 mg/dL (ref 8.4–10.5)
CO2: 28 meq/L (ref 19–32)
CREATININE: 1.12 mg/dL (ref 0.40–1.20)
Chloride: 101 mEq/L (ref 96–112)
GFR: 52.38 mL/min — ABNORMAL LOW (ref >60.00)
Glucose, Bld: 182 mg/dL — ABNORMAL HIGH (ref 70–99)
Potassium: 4.3 mEq/L (ref 3.5–5.1)
Sodium: 137 mEq/L (ref 135–145)
Total Protein: 7.3 g/dL (ref 6.0–8.3)

## 2016-10-22 LAB — LIPID PANEL
CHOLESTEROL: 232 mg/dL — AB (ref 0–200)
HDL: 51.3 mg/dL (ref >39.00)
LDL CALC: 154 mg/dL — AB (ref 0–99)
NonHDL: 180.78
Total CHOL/HDL Ratio: 5
Triglycerides: 132 mg/dL (ref 0.0–149.0)
VLDL: 26.4 mg/dL (ref 0.0–40.0)

## 2016-10-22 LAB — CBC
HCT: 37.2 % (ref 36.0–46.0)
Hemoglobin: 12.2 g/dL (ref 12.0–15.0)
MCHC: 32.8 g/dL (ref 30.0–36.0)
MCV: 81.2 fl (ref 78.0–100.0)
PLATELETS: 274 10*3/uL (ref 150.0–400.0)
RBC: 4.58 Mil/uL (ref 3.87–5.11)
RDW: 14.4 % (ref 11.5–15.5)
WBC: 6 10*3/uL (ref 4.0–10.5)

## 2016-10-22 LAB — MICROALBUMIN / CREATININE URINE RATIO
CREATININE, U: 81 mg/dL
Microalb Creat Ratio: 0.9 mg/g (ref 0.0–30.0)

## 2016-10-22 LAB — MAGNESIUM: Magnesium: 1.7 mg/dL (ref 1.5–2.5)

## 2016-10-22 MED ORDER — METRONIDAZOLE 1 % EX GEL: Freq: Every day | CUTANEOUS | 3 refills | Status: DC

## 2016-10-22 NOTE — Progress Notes (Signed)
Pre visit review using our clinic review tool, if applicable. No additional management support is needed unless otherwise documented below in the visit note. 

## 2016-10-22 NOTE — Assessment & Plan Note (Signed)
On Levothyroxine, continue to monitor 

## 2016-10-22 NOTE — Assessment & Plan Note (Signed)
Started on Metrogel

## 2016-10-22 NOTE — Progress Notes (Signed)
Patient ID: [name] [name], female   DOB: [birth year], 62 y.o.   MRN: [id number]   Subjective:    Patient ID: [name] [name], female    DOB: 1956-01-17, 61 y.o.   MRN: [id number]  Chief Complaint  Patient presents with  . Follow-up    HPI Patient is in today for follow up. No recent febrile illness or acute concerns. No hospitalizations. Has gained weight with new meds and job change but otherwise feels well. Notes some rash on her face over past few months with cystic lesions. Sugars are up this am 215 normally around 17o fasting. No polyuria or polydipsia. Denies CP/palp/SOB/HA/congestion/fevers/GI or GU c/o. Taking meds as prescribed  Past Medical History:  Diagnosis Date  . Abdominal pain 08/10/2014  . Anterior neck pain 08/10/2014  . Asthma   . Atypical chest pain    normal coronaries and LV function by cath 10/2011  . Chronic pain disorder    neck and shoulder  . CTS (carpal tunnel syndrome) 01/27/2013   right  . Diabetes mellitus type II   . Diabetic gastroparesis ([place])   . Esophageal reflux 08/25/2013   [place] [facility] [place], Dr Barth Kirks  . Fatty liver    abnormal transaminases; negative work up  . Fundic gland polyps of stomach, benign   . GERD (gastroesophageal reflux disease)   . Hip pain, bilateral 07/19/2012  . HLD (hyperlipidemia)   . HTN (hypertension)   . Hypokalemia 05/25/2013   Improved stopping HCTZ. Was noted to have an elevated glucose when K was low. Recheck renal next week after starting Maxzide  . Hypomagnesemia 08/10/2014  . Hypothyroidism   . Leg cramps 04/15/2016  . Nausea without vomiting 08/08/2014  . Obesity 10/22/2016  . Osteoarthritis    chronic, right knee  . Other malaise and fatigue 01/27/2013  . Plantar fasciitis   . Preventative health care 04/15/2016    Past Surgical History:  Procedure Laterality Date  . ABDOMINAL HYSTERECTOMY  1982  . ABDOMINAL HYSTERECTOMY    . CHOLECYSTECTOMY    . COLONOSCOPY    . ESOPHAGOGASTRODUODENOSCOPY    . KNEE  ARTHROSCOPY  1998, [id number]  . LEFT HEART CATHETERIZATION WITH CORONARY ANGIOGRAM N/A 11/21/2011   Procedure: LEFT HEART CATHETERIZATION WITH CORONARY ANGIOGRAM;  Surgeon: Josue Hector, MD;  Location: [facility] CATH LAB;  Service: Cardiovascular;  Laterality: N/A;  . TONSILLECTOMY  1975  . VESICOVAGINAL FISTULA CLOSURE W/ TAH  1995    Family History  Problem Relation Age of Onset  . Alzheimer's disease Mother   . Diabetes type II Mother   . Hiatal hernia Mother   . Diabetes Mother   . Emphysema Father   . Depression Sister        suicide  . Diabetes Sister   . Colon cancer Neg Hx   . Stomach cancer Neg Hx     Social History   Social History  . Marital status: Married    Spouse name: N/A  . Number of children: 1  . Years of education: N/A   Occupational History  . 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 Smoking status: Never Smoker  . Smokeless tobacco: Never Used  . Alcohol use No  . Drug use: No  . Sexual activity: No   Other Topics Concern  . Not on file   Social History Narrative   Multiple family members with intolerance to statins.    Outpatient Medications Prior to Visit  Medication Sig  Dispense Refill  . Alcohol Swabs (ALCOHOL PADS) 70 % PADS Use prior to checking blood sugars to clean skin    . aspirin 81 MG tablet Take 81 mg by mouth daily.      . B-D ULTRAFINE III SHORT PEN 31G X 8 MM MISC INJECT ONCE DAILY 100 each 3  . FARXIGA 10 MG TABS tablet TAKE 1 TABLET BY MOUTH EVERY DAY 90 tablet 3  . fenofibrate 160 MG tablet TAKE 1 TABLET (160 MG TOTAL) BY MOUTH DAILY. 90 tablet 2  . fluticasone (FLONASE) 50 MCG/ACT nasal spray Place into both nostrils daily.    . Fluticasone-Salmeterol (ADVAIR DISKUS) 250-50 MCG/DOSE AEPB INHALE 1 PUFF INTO LUNGS 2 TIMES DAILY 3 each 3  . Glucosamine-Chondroitin (OSTEO BI-FLEX REGULAR STRENGTH) 250-200 MG TABS Take 1 tablet by mouth 2 (two) times daily.     . metFORMIN (GLUCOPHAGE-XR) 500 MG 24 hr  tablet TAKE 2 TABLETS EVERY DAY 180 tablet 3  . Multiple Vitamin (MULTIVITAMIN) tablet Take 1 tablet by mouth daily.    . niacin (NIASPAN) 500 MG CR tablet 1 tab po qhs with lowfat snack and Aspirin 1/2 hour prior  Failed all statins and welchol (Patient taking differently: Take 500 mg by mouth at bedtime. 1 tab po qhs with lowfat snack and Aspirin 1/2 hour prior  Failed all statins and welchol) 30 tablet 5  . ONE TOUCH ULTRA TEST test strip USE AS DIRECTED ONCE A DAY AND AS NEEDED 100 each 3  . ONETOUCH DELICA LANCETS 16X MISC USE AS DIRECTED ONCE A DAY AND AS NEEDED 100 each 1  . pantoprazole (PROTONIX) 40 MG tablet TAKE 1 TABLET DAILY 90 tablet 1  . pioglitazone (ACTOS) 30 MG tablet Take 1 tablet (30 mg total) by mouth daily. 90 tablet 3  . PROAIR HFA 108 (90 Base) MCG/ACT inhaler INHALE 2 PUFFS INTO THE LUNGS EVERY 6 (SIX) HOURS AS NEEDED FOR WHEEZING OR SHORTNESS OF BREATH. 8.5 Inhaler 0  . repaglinide (PRANDIN) 0.5 MG tablet Take 1 tablet (0.5 mg total) by mouth daily with supper. 90 tablet 2  . SYNTHROID 88 MCG tablet TAKE 1 TABLET DAILY 90 tablet 1  . triamterene-hydrochlorothiazide (MAXZIDE-25) 37.5-25 MG tablet TAKE 1/2 TABLET BY MOUTH EVERY DAY 45 tablet 3  . VICTOZA 18 MG/3ML SOPN INJECT 1.8MG (0.3ML) DAILY FOR 90 DAYS 9 mL 3  . doxycycline (VIBRAMYCIN) 100 MG capsule Take 1 capsule (100 mg total) by mouth 2 (two) times daily. Take with food (Rx void after 08/17/16) 14 capsule 0  . metoCLOPramide (REGLAN) 10 MG tablet TAKE 1 TABLET EVERY MORNING AND TAKE 1 TABLET EVERY EVENING 60 tablet 0  . mometasone (NASONEX) 50 MCG/ACT nasal spray 2 sprays each nares q day (Patient taking differently: daily as needed. PRN) 17 g 2  . predniSONE (DELTASONE) 20 MG tablet Take one tab by mouth twice daily for 5 days, then one daily for 3 days. Take with food. 13 tablet 0   No facility-administered medications prior to visit.     Allergies  Allergen Reactions  . Amoxicillin   . Epinephrine Other  (See Comments)    Pass out   . Simvastatin     REACTION: back ache  . Statins     myalgias  . Welchol [Colesevelam Hcl]     myalgia    Review of Systems  Constitutional: Negative for fever and malaise/fatigue.  HENT: Negative for congestion.   Eyes: Negative for blurred vision.  Respiratory: Negative for shortness of breath.  Cardiovascular: Negative for chest pain, palpitations and leg swelling.  Gastrointestinal: Negative for abdominal pain, blood in stool and nausea.  Genitourinary: Negative for dysuria and frequency.  Musculoskeletal: Negative for falls.  Skin: Negative for rash.  Neurological: Negative for dizziness, loss of consciousness and headaches.  Endo/Heme/Allergies: Negative for environmental allergies.  Psychiatric/Behavioral: Negative for depression. The patient is not nervous/anxious.        Objective:    Physical Exam  Constitutional: She is oriented to person, place, and time. She appears well-developed and well-nourished. No distress.  HENT:  Head: Normocephalic and atraumatic.  Nose: Nose normal.  Eyes: Right eye exhibits no discharge. Left eye exhibits no discharge.  Neck: Normal range of motion. Neck supple.  Cardiovascular: Normal rate and regular rhythm.   No murmur heard. Pulmonary/Chest: Effort normal and breath sounds normal.  Abdominal: Soft. Bowel sounds are normal. There is no tenderness.  Musculoskeletal: She exhibits no edema.  Neurological: She is alert and oriented to person, place, and time.  Skin: Skin is warm and dry.  Psychiatric: She has a normal mood and affect.  Nursing note and vitals reviewed.   BP 110/64 (BP Location: Left Arm, Patient Position: Sitting, Cuff Size: Normal)   Pulse 88   Temp 98 F (36.7 C) (Oral)   Ht 5\' 6"  (1.676 m)   Wt 169 lb 8 oz (76.9 kg)   SpO2 97%   BMI 27.36 kg/m  Wt Readings from Last 3 Encounters:  10/22/16 169 lb 8 oz (76.9 kg)  08/09/16 168 lb (76.2 kg)  07/02/16 164 lb (74.4 kg)      Lab Results  Component Value Date   WBC 7.1 07/02/2016   HGB 12.4 07/02/2016   HCT 37.9 07/02/2016   PLT 280.0 07/02/2016   GLUCOSE 135 (H) 07/02/2016   CHOL 211 (H) 04/15/2016   TRIG 140.0 04/15/2016   HDL 70.10 04/15/2016   LDLDIRECT 151.0 06/01/2015   LDLCALC 113 (H) 04/15/2016   ALT 16 07/02/2016   AST 16 07/02/2016   NA 140 07/02/2016   K 4.0 07/02/2016   CL 102 07/02/2016   CREATININE 1.06 07/02/2016   BUN 19 07/02/2016   CO2 32 07/02/2016   TSH 2.03 04/15/2016   INR 1.07 11/20/2011   HGBA1C 7.0 06/24/2016   MICROALBUR 0.7 06/01/2015    Lab Results  Component Value Date   TSH 2.03 04/15/2016   Lab Results  Component Value Date   WBC 7.1 07/02/2016   HGB 12.4 07/02/2016   HCT 37.9 07/02/2016   MCV 81.0 07/02/2016   PLT 280.0 07/02/2016   Lab Results  Component Value Date   NA 140 07/02/2016   K 4.0 07/02/2016   CO2 32 07/02/2016   GLUCOSE 135 (H) 07/02/2016   BUN 19 07/02/2016   CREATININE 1.06 07/02/2016   BILITOT 0.4 07/02/2016   ALKPHOS 47 07/02/2016   AST 16 07/02/2016   ALT 16 07/02/2016   PROT 7.3 07/02/2016   ALBUMIN 4.4 07/02/2016   CALCIUM 9.8 07/02/2016   ANIONGAP 15 11/28/2014   GFR 55.87 (L) 07/02/2016   Lab Results  Component Value Date   CHOL 211 (H) 04/15/2016   Lab Results  Component Value Date   HDL 70.10 04/15/2016   Lab Results  Component Value Date   LDLCALC 113 (H) 04/15/2016   Lab Results  Component Value Date   TRIG 140.0 04/15/2016   Lab Results  Component Value Date   CHOLHDL 3 04/15/2016   Lab Results  Component Value Date   HGBA1C 7.0 06/24/2016       Assessment & Plan:   Problem List Items Addressed This Visit    Hypothyroidism    On Levothyroxine, continue to monitor      Hyperlipidemia, mixed    Encouraged heart healthy diet, increase exercise, avoid trans fats, consider a krill oil cap daily      Relevant Orders   Lipid panel   Hypertension    Well controlled, no changes to  meds. Encouraged heart healthy diet such as the DASH diet and exercise as tolerated.       Relevant Orders   CBC   Comprehensive metabolic panel   TSH   Hypomagnesemia    Check level today      Relevant Orders   Magnesium   DM (diabetes mellitus), type 2 (HCC)    hgba1c acceptable, minimize simple carbs. Increase exercise as tolerated. Continue current meds      Relevant Orders   Hemoglobin A1c   Microalbumin / creatinine urine ratio   Obesity    Encouraged DASH diet, decrease po intake and increase exercise as tolerated. Needs 7-8 hours of sleep nightly. Avoid trans fats, eat small, frequent meals every 4-5 hours with lean proteins, complex carbs and healthy fats. Minimize simple carbs, bariatric referral offeredd         I have discontinued Ms. Clapper's mometasone, metoCLOPramide, predniSONE, and doxycycline. I am also having her start on metroNIDAZOLE. Additionally, I am having her maintain her aspirin, Glucosamine-Chondroitin, Fluticasone-Salmeterol, multivitamin, Alcohol Pads, niacin, fluticasone, ONETOUCH DELICA LANCETS 48A, pioglitazone, VICTOZA, B-D ULTRAFINE III SHORT PEN, ONE TOUCH ULTRA TEST, PROAIR HFA, pantoprazole, fenofibrate, SYNTHROID, metFORMIN, repaglinide, triamterene-hydrochlorothiazide, and FARXIGA.  Meds ordered this encounter  Medications  . metroNIDAZOLE (METROGEL) 1 % gel    Sig: Apply topically daily.    Dispense:  45 g    Refill:  3    CMA served as scribe during this visit. History, Physical and Plan performed by medical provider. Documentation and orders reviewed and attested to.  Penni Homans, MD

## 2016-10-22 NOTE — Assessment & Plan Note (Signed)
Encouraged DASH diet, decrease po intake and increase exercise as tolerated. Needs 7-8 hours of sleep nightly. Avoid trans fats, eat small, frequent meals every 4-5 hours with lean proteins, complex carbs and healthy fats. Minimize simple carbs, bariatric referral offeredd

## 2016-10-22 NOTE — Assessment & Plan Note (Signed)
Encouraged heart healthy diet, increase exercise, avoid trans fats, consider a krill oil cap daily 

## 2016-10-22 NOTE — Assessment & Plan Note (Signed)
Check level today 

## 2016-10-22 NOTE — Assessment & Plan Note (Signed)
hgba1c acceptable, minimize simple carbs. Increase exercise as tolerated. Continue current meds 

## 2016-10-22 NOTE — Patient Instructions (Addendum)
Check with insurance regarding payment for Shingrix Had Zostavax in November of 2017    Diabetes Mellitus and Exercise Exercising regularly is important for your overall health, especially when you have diabetes (diabetes mellitus). Exercising is not only about losing weight. It has many health benefits, such as increasing muscle strength and bone density and reducing body fat and stress. This leads to improved fitness, flexibility, and endurance, all of which result in better overall health. Exercise has additional benefits for people with diabetes, including:  Reducing appetite.  Helping to lower and control blood glucose.  Lowering blood pressure.  Helping to control amounts of fatty substances (lipids) in the blood, such as cholesterol and triglycerides.  Helping the body to respond better to insulin (improving insulin sensitivity).  Reducing how much insulin the body needs.  Decreasing the risk for heart disease by:  Lowering cholesterol and triglyceride levels.  Increasing the levels of good cholesterol.  Lowering blood glucose levels. What is my activity plan? Your health care provider or certified diabetes educator can help you make a plan for the type and frequency of exercise (activity plan) that works for you. Make sure that you:  Do at least 150 minutes of moderate-intensity or vigorous-intensity exercise each week. This could be brisk walking, biking, or water aerobics.  Do stretching and strength exercises, such as yoga or weightlifting, at least 2 times a week.  Spread out your activity over at least 3 days of the week.  Get some form of physical activity every day.  Do not go more than 2 days in a row without some kind of physical activity.  Avoid being inactive for more than 90 minutes at a time. Take frequent breaks to walk or stretch.  Choose a type of exercise or activity that you enjoy, and set realistic goals.  Start slowly, and gradually increase  the intensity of your exercise over time. What do I need to know about managing my diabetes?  Check your blood glucose before and after exercising.  If your blood glucose is higher than 240 mg/dL (13.3 mmol/L) before you exercise, check your urine for ketones. If you have ketones in your urine, do not exercise until your blood glucose returns to normal.  Know the symptoms of low blood glucose (hypoglycemia) and how to treat it. Your risk for hypoglycemia increases during and after exercise. Common symptoms of hypoglycemia can include:  Hunger.  Anxiety.  Sweating and feeling clammy.  Confusion.  Dizziness or feeling light-headed.  Increased heart rate or palpitations.  Blurry vision.  Tingling or numbness around the mouth, lips, or tongue.  Tremors or shakes.  Irritability.  Keep a rapid-acting carbohydrate snack available before, during, and after exercise to help prevent or treat hypoglycemia.  Avoid injecting insulin into areas of the body that are going to be exercised. For example, avoid injecting insulin into:  The arms, when playing tennis.  The legs, when jogging.  Keep records of your exercise habits. Doing this can help you and your health care provider adjust your diabetes management plan as needed. Write down:  Food that you eat before and after you exercise.  Blood glucose levels before and after you exercise.  The type and amount of exercise you have done.  When your insulin is expected to peak, if you use insulin. Avoid exercising at times when your insulin is peaking.  When you start a new exercise or activity, work with your health care provider to make sure the activity is safe  for you, and to adjust your insulin, medicines, or food intake as needed.  Drink plenty of water while you exercise to prevent dehydration or heat stroke. Drink enough fluid to keep your urine clear or pale yellow. This information is not intended to replace advice given to  you by your health care provider. Make sure you discuss any questions you have with your health care provider. Document Released: 08/03/2003 Document Revised: 12/01/2015 Document Reviewed: 10/23/2015 Elsevier Interactive Patient Education  2017 [employer].

## 2016-10-22 NOTE — Assessment & Plan Note (Signed)
Well controlled, no changes to meds. Encouraged heart healthy diet such as the DASH diet and exercise as tolerated.  °

## 2016-10-24 ENCOUNTER — Ambulatory Visit (INDEPENDENT_AMBULATORY_CARE_PROVIDER_SITE_OTHER): Payer: BLUE CROSS/BLUE SHIELD | Admitting: Endocrinology

## 2016-10-24 ENCOUNTER — Encounter: Payer: Self-pay | Admitting: Endocrinology

## 2016-10-24 VITALS — BP 122/70 | HR 94 | Ht 66.0 in | Wt 170.0 lb

## 2016-10-24 DIAGNOSIS — E1143 Type 2 diabetes mellitus with diabetic autonomic (poly)neuropathy: Secondary | ICD-10-CM

## 2016-10-24 MED ORDER — REPAGLINIDE 1 MG PO TABS: 1.0000 mg | ORAL_TABLET | Freq: Three times a day (TID) | ORAL | 3 refills | Status: DC

## 2016-10-24 NOTE — Patient Instructions (Addendum)
check your blood sugar once a day.  vary the time of day when you check, between before the 3 meals, and at bedtime.  also check if you have symptoms of your blood sugar being too high or too low.  please keep a record of the readings and bring it to your next appointment here.  You can write it on any piece of paper.  please call us sooner if your blood sugar goes below 70, or if you have a lot of readings over 200.   I have sent a prescription to your pharmacy, to increase the repaglinide.    Please come back for a follow-up appointment in 4 months.

## 2016-10-24 NOTE — Progress Notes (Signed)
Subjective:    Patient ID: [name] [name], female    DOB: [birth year], 61 y.o.   MRN: [id number]  HPI Pt returns for f/u of diabetes mellitus: DM type: 2 Dx'ed: 0962 Complications: gastgroparesis Therapy: 4 oral meds and victoza.  GDM: never DKA: never.  Severe hypoglycemia: never.  Pancreatitis: never.  Other: she has never been on insulin; she declines to take prandin or starlix; diarrhea limits metformin dosage; she did not tolerate parlodel (vomiting).   Interval history: She does not miss med doses.  pt states she feels well in general.  Nausea is resolved, despite taking the full 1.8 mg qd of victoza.   Past Medical History:  Diagnosis Date  . Abdominal pain 08/10/2014  . Anterior neck pain 08/10/2014  . Asthma   . Atypical chest pain    normal coronaries and LV function by cath 10/2011  . Chronic pain disorder    neck and shoulder  . CTS (carpal tunnel syndrome) 01/27/2013   right  . Diabetes mellitus type II   . Diabetic gastroparesis ([place])   . Esophageal reflux 08/25/2013   [facility] [place], Dr Barth Kirks  . Fatty liver    abnormal transaminases; negative work up  . Fundic gland polyps of stomach, benign   . GERD (gastroesophageal reflux disease)   . Hip pain, bilateral 07/19/2012  . HLD (hyperlipidemia)   . HTN (hypertension)   . Hypokalemia 05/25/2013   Improved stopping HCTZ. Was noted to have an elevated glucose when K was low. Recheck renal next week after starting Maxzide  . Hypomagnesemia 08/10/2014  . Hypothyroidism   . Leg cramps 04/15/2016  . Nausea without vomiting 08/08/2014  . Obesity 10/22/2016  . Osteoarthritis    chronic, right knee  . Other malaise and fatigue 01/27/2013  . Plantar fasciitis   . Preventative health care 04/15/2016  . Rosacea 10/22/2016    Past Surgical History:  Procedure Laterality Date  . ABDOMINAL HYSTERECTOMY  1982  . ABDOMINAL HYSTERECTOMY    . CHOLECYSTECTOMY    . COLONOSCOPY    . ESOPHAGOGASTRODUODENOSCOPY    . KNEE  ARTHROSCOPY  1998, [id number]  . LEFT HEART CATHETERIZATION WITH CORONARY ANGIOGRAM N/A 11/21/2011   Procedure: LEFT HEART CATHETERIZATION WITH CORONARY ANGIOGRAM;  Surgeon: Josue Hector, MD;  Location: [facility] CATH LAB;  Service: Cardiovascular;  Laterality: N/A;  . TONSILLECTOMY  1975  . VESICOVAGINAL FISTULA CLOSURE W/ TAH  1995    Social History   Social History  . Marital status: Married    Spouse name: N/A  . Number of children: 1  . Years of education: N/A   Occupational History  . 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 Smoking status: Never Smoker  . Smokeless tobacco: Never Used  . Alcohol use No  . Drug use: No  . Sexual activity: No   Other Topics Concern  . Not on file   Social History Narrative   Multiple family members with intolerance to statins.    Current Outpatient Prescriptions on File Prior to Visit  Medication Sig Dispense Refill  . Alcohol Swabs (ALCOHOL PADS) 70 % PADS Use prior to checking blood sugars to clean skin    . aspirin 81 MG tablet Take 81 mg by mouth daily.      . B-D ULTRAFINE III SHORT PEN 31G X 8 MM MISC INJECT ONCE DAILY 100 each 3  . FARXIGA 10 MG TABS tablet TAKE 1 TABLET BY MOUTH EVERY  DAY 90 tablet 3  . fenofibrate 160 MG tablet TAKE 1 TABLET (160 MG TOTAL) BY MOUTH DAILY. 90 tablet 2  . fluticasone (FLONASE) 50 MCG/ACT nasal spray Place into both nostrils daily.    . Fluticasone-Salmeterol (ADVAIR DISKUS) 250-50 MCG/DOSE AEPB INHALE 1 PUFF INTO LUNGS 2 TIMES DAILY 3 each 3  . Glucosamine-Chondroitin (OSTEO BI-FLEX REGULAR STRENGTH) 250-200 MG TABS Take 1 tablet by mouth 2 (two) times daily.     . metFORMIN (GLUCOPHAGE-XR) 500 MG 24 hr tablet TAKE 2 TABLETS EVERY DAY 180 tablet 3  . metroNIDAZOLE (METROGEL) 1 % gel Apply topically daily. 45 g 3  . Multiple Vitamin (MULTIVITAMIN) tablet Take 1 tablet by mouth daily.    . niacin (NIASPAN) 500 MG CR tablet 1 tab po qhs with lowfat snack and Aspirin 1/2 hour  prior  Failed all statins and welchol (Patient taking differently: Take 500 mg by mouth at bedtime. 1 tab po qhs with lowfat snack and Aspirin 1/2 hour prior  Failed all statins and welchol) 30 tablet 5  . ONE TOUCH ULTRA TEST test strip USE AS DIRECTED ONCE A DAY AND AS NEEDED 100 each 3  . ONETOUCH DELICA LANCETS 15V MISC USE AS DIRECTED ONCE A DAY AND AS NEEDED 100 each 1  . pantoprazole (PROTONIX) 40 MG tablet TAKE 1 TABLET DAILY 90 tablet 1  . pioglitazone (ACTOS) 30 MG tablet Take 1 tablet (30 mg total) by mouth daily. 90 tablet 3  . PROAIR HFA 108 (90 Base) MCG/ACT inhaler INHALE 2 PUFFS INTO THE LUNGS EVERY 6 (SIX) HOURS AS NEEDED FOR WHEEZING OR SHORTNESS OF BREATH. 8.5 Inhaler 0  . SYNTHROID 88 MCG tablet TAKE 1 TABLET DAILY 90 tablet 1  . triamterene-hydrochlorothiazide (MAXZIDE-25) 37.5-25 MG tablet TAKE 1/2 TABLET BY MOUTH EVERY DAY 45 tablet 3  . VICTOZA 18 MG/3ML SOPN INJECT 1.8MG (0.3ML) DAILY FOR 90 DAYS 9 mL 3   No current facility-administered medications on file prior to visit.     Allergies  Allergen Reactions  . Amoxicillin   . Epinephrine Other (See Comments)    Pass out   . Simvastatin     REACTION: back ache  . Statins     myalgias  . Welchol [Colesevelam Hcl]     myalgia    Family History  Problem Relation Age of Onset  . Alzheimer's disease Mother   . Diabetes type II Mother   . Hiatal hernia Mother   . Diabetes Mother   . Emphysema Father   . Depression Sister        suicide  . Diabetes Sister   . Colon cancer Neg Hx   . Stomach cancer Neg Hx     BP 122/70   Pulse 94   Ht 5\' 6"  (1.676 m)   Wt 170 lb (77.1 kg)   SpO2 94%   BMI 27.44 kg/m    Review of Systems She has gained weight.     Objective:   Physical Exam VITAL SIGNS:  See vs page GENERAL: no distress Pulses: dorsalis pedis intact bilat.   MSK: no deformity of the feet CV: no leg edema Skin:  no ulcer on the feet.  normal color and temp on the feet.  Neuro: sensation is  intact to touch on the feet.    Lab Results  Component Value Date   HGBA1C 7.5 (H) 10/22/2016   Lab Results  Component Value Date   CREATININE 1.12 10/22/2016   BUN 25 (H) 10/22/2016  NA 137 10/22/2016   K 4.3 10/22/2016   CL 101 10/22/2016   CO2 28 10/22/2016       Assessment & Plan:  Type 2 DM, with gastroparesis, worse Obesity: worse.  We discussed.    Patient Instructions  check your blood sugar once a day.  vary the time of day when you check, between before the 3 meals, and at bedtime.  also check if you have symptoms of your blood sugar being too high or too low.  please keep a record of the readings and bring it to your next appointment here.  You can write it on any piece of paper.  please call us sooner if your blood sugar goes below 70, or if you have a lot of readings over 200.   I have sent a prescription to your pharmacy, to increase the repaglinide.    Please come back for a follow-up appointment in 4 months.

## 2016-12-02 ENCOUNTER — Other Ambulatory Visit: Payer: Self-pay | Admitting: Family Medicine

## 2016-12-02 ENCOUNTER — Encounter: Payer: Self-pay | Admitting: Family Medicine

## 2016-12-03 MED ORDER — PANTOPRAZOLE SODIUM 40 MG PO TBEC: 40.0000 mg | DELAYED_RELEASE_TABLET | Freq: Every day | ORAL | 1 refills | Status: DC

## 2016-12-09 ENCOUNTER — Other Ambulatory Visit: Payer: Self-pay | Admitting: Endocrinology

## 2017-01-16 ENCOUNTER — Ambulatory Visit (INDEPENDENT_AMBULATORY_CARE_PROVIDER_SITE_OTHER): Payer: BLUE CROSS/BLUE SHIELD | Admitting: Family Medicine

## 2017-01-16 ENCOUNTER — Other Ambulatory Visit: Payer: Self-pay | Admitting: Family Medicine

## 2017-01-16 ENCOUNTER — Encounter: Payer: Self-pay | Admitting: Family Medicine

## 2017-01-16 VITALS — BP 120/70 | HR 83 | Temp 97.8°F | Ht 66.0 in | Wt 171.2 lb

## 2017-01-16 DIAGNOSIS — R1032 Left lower quadrant pain: Secondary | ICD-10-CM

## 2017-01-16 LAB — COMPREHENSIVE METABOLIC PANEL
ALT: 16 U/L (ref 0–35)
AST: 19 U/L (ref 0–37)
Albumin: 4.1 g/dL (ref 3.5–5.2)
Alkaline Phosphatase: 42 U/L (ref 39–117)
BUN: 21 mg/dL (ref 6–23)
CO2: 30 meq/L (ref 19–32)
CREATININE: 1.16 mg/dL (ref 0.40–1.20)
Calcium: 9.6 mg/dL (ref 8.4–10.5)
Chloride: 100 mEq/L (ref 96–112)
GFR: 50.26 mL/min — ABNORMAL LOW (ref >60.00)
GLUCOSE: 167 mg/dL — AB (ref 70–99)
Potassium: 3.7 mEq/L (ref 3.5–5.1)
Sodium: 138 mEq/L (ref 135–145)
Total Bilirubin: 0.4 mg/dL (ref 0.2–1.2)
Total Protein: 7.2 g/dL (ref 6.0–8.3)

## 2017-01-16 LAB — CBC
HCT: 36.5 % (ref 36.0–46.0)
Hemoglobin: 11.7 g/dL — ABNORMAL LOW (ref 12.0–15.0)
MCHC: 32.2 g/dL (ref 30.0–36.0)
MCV: 80.3 fl (ref 78.0–100.0)
PLATELETS: 297 10*3/uL (ref 150.0–400.0)
RBC: 4.55 Mil/uL (ref 3.87–5.11)
RDW: 14.1 % (ref 11.5–15.5)
WBC: 6.1 10*3/uL (ref 4.0–10.5)

## 2017-01-16 NOTE — Progress Notes (Signed)
TV US ordered per OV note, pt notified via MyChart.

## 2017-01-16 NOTE — Progress Notes (Signed)
Pre visit review using our clinic review tool, if applicable. No additional management support is needed unless otherwise documented below in the visit note. 

## 2017-01-16 NOTE — Progress Notes (Signed)
Chief Complaint  Patient presents with  . Abdominal Pain    left side    RHEMI BALBACH is here for abdominal pain.  Duration: 1 day Nighttime awakenings? No Bleeding? No Weight loss? No Palliation: Nothing  Provocation: Rising from seated to standing position Issue is not affected by BM's, flatulence or eating/drinking Associated symptoms: nausea (chronic) Denies: Bowel changes, fevers, vomiting, recent travel, urinary complaints, injury, medication change, sick contacts Treatment to date: none  ROS: Constitutional: No fevers GI: No V/D/C, no bleeding + pain  Past Medical History:  Diagnosis Date  . Abdominal pain 08/10/2014  . Anterior neck pain 08/10/2014  . Asthma   . Atypical chest pain    normal coronaries and LV function by cath 10/2011  . Chronic pain disorder    neck and shoulder  . CTS (carpal tunnel syndrome) 01/27/2013   right  . Diabetes mellitus type II   . Diabetic gastroparesis ([place])   . Esophageal reflux 08/25/2013   [facility] [place], Dr Barth Kirks  . Fatty liver    abnormal transaminases; negative work up  . Fundic gland polyps of stomach, benign   . GERD (gastroesophageal reflux disease)   . Hip pain, bilateral 07/19/2012  . HLD (hyperlipidemia)   . HTN (hypertension)   . Hypokalemia 05/25/2013   Improved stopping HCTZ. Was noted to have an elevated glucose when K was low. Recheck renal next week after starting Maxzide  . Hypomagnesemia 08/10/2014  . Hypothyroidism   . Leg cramps 04/15/2016  . Nausea without vomiting 08/08/2014  . Obesity 10/22/2016  . Osteoarthritis    chronic, right knee  . Other malaise and fatigue 01/27/2013  . Plantar fasciitis   . Preventative health care 04/15/2016  . Rosacea 10/22/2016   Family History  Problem Relation Age of Onset  . Alzheimer's disease Mother   . Diabetes type II Mother   . Hiatal hernia Mother   . Diabetes Mother   . Emphysema Father   . Depression Sister        suicide  . Diabetes Sister   .  Colon cancer Neg Hx   . Stomach cancer Neg Hx    Past Surgical History:  Procedure Laterality Date  . ABDOMINAL HYSTERECTOMY  1982  . ABDOMINAL HYSTERECTOMY    . CHOLECYSTECTOMY    . COLONOSCOPY    . ESOPHAGOGASTRODUODENOSCOPY    . KNEE ARTHROSCOPY  1998, [id number]  . LEFT HEART CATHETERIZATION WITH CORONARY ANGIOGRAM N/A 11/21/2011   Procedure: LEFT HEART CATHETERIZATION WITH CORONARY ANGIOGRAM;  Surgeon: Josue Hector, MD;  Location: [facility] CATH LAB;  Service: Cardiovascular;  Laterality: N/A;  . TONSILLECTOMY  1975  . VESICOVAGINAL FISTULA CLOSURE W/ TAH  1995    BP 120/70 (BP Location: Left Arm, Patient Position: Sitting, Cuff Size: Normal)   Pulse 83   Temp 97.8 F (36.6 C) (Oral)   Ht 5\' 6"  (1.676 m)   Wt 171 lb 4 oz (77.7 kg)   SpO2 98%   BMI 27.64 kg/m  Gen.: Awake, alert, appears stated age 61: Mucous membranes moist without mucosal lesions Heart: Regular rate and rhythm without murmurs Lungs: Clear auscultation bilaterally, no rales or wheezing, normal effort without accessory muscle use. Abdomen: Bowel sounds are present. Abdomen is soft, TTP in LLQ and LUQ, nondistended, no masses or organomegaly. Negative Murphy's, Rovsing's, McBurney's, and Carnett's sign. Psych: Age appropriate judgment and insight. Normal mood and affect.  Left lower quadrant abdominal pain of unknown etiology - Plan: CBC, Comprehensive metabolic  panel  Orders as above. Reviewed colonoscopy that did show diverticulosis, inflamm of this is on ddx. Will also consider ovarian etiology should labs be normal and will order [place]. Abx offered to empirically tx for this, but she would like to see what labs show.  F/u pending the above. Pt voiced understanding and agreement to the plan.  [place], DO 01/16/17 10:50 AM

## 2017-01-16 NOTE — Patient Instructions (Addendum)
I will send you a message regarding you results and the next steps. It could be an ultrasound or possibly medication depending on the results.   Seek care or let us know if you have new or worsening symptoms.  Let us know if you need anything.

## 2017-01-17 ENCOUNTER — Other Ambulatory Visit: Payer: Self-pay | Admitting: Family Medicine

## 2017-01-17 DIAGNOSIS — R1032 Left lower quadrant pain: Secondary | ICD-10-CM

## 2017-01-19 ENCOUNTER — Ambulatory Visit (HOSPITAL_BASED_OUTPATIENT_CLINIC_OR_DEPARTMENT_OTHER): Admission: RE | Admit: 2017-01-19 | Payer: BLUE CROSS/BLUE SHIELD | Source: Ambulatory Visit

## 2017-01-19 ENCOUNTER — Ambulatory Visit (HOSPITAL_BASED_OUTPATIENT_CLINIC_OR_DEPARTMENT_OTHER): Payer: BLUE CROSS/BLUE SHIELD

## 2017-02-24 ENCOUNTER — Encounter: Payer: Self-pay | Admitting: Family Medicine

## 2017-02-24 ENCOUNTER — Ambulatory Visit (INDEPENDENT_AMBULATORY_CARE_PROVIDER_SITE_OTHER): Payer: BLUE CROSS/BLUE SHIELD | Admitting: Endocrinology

## 2017-02-24 ENCOUNTER — Encounter: Payer: Self-pay | Admitting: Endocrinology

## 2017-02-24 VITALS — BP 122/66 | HR 95 | Wt 169.0 lb

## 2017-02-24 DIAGNOSIS — E1143 Type 2 diabetes mellitus with diabetic autonomic (poly)neuropathy: Secondary | ICD-10-CM | POA: Diagnosis not present

## 2017-02-24 LAB — POCT GLYCOSYLATED HEMOGLOBIN (HGB A1C): HEMOGLOBIN A1C: 6.8

## 2017-02-24 MED ORDER — LEVOTHYROXINE SODIUM 88 MCG PO TABS: 88.0000 ug | ORAL_TABLET | Freq: Every day | ORAL | 1 refills | Status: DC

## 2017-02-24 NOTE — Patient Instructions (Addendum)
check your blood sugar once a day.  vary the time of day when you check, between before the 3 meals, and at bedtime.  also check if you have symptoms of your blood sugar being too high or too low.  please keep a record of the readings and bring it to your next appointment here.  You can write it on any piece of paper.  please call us sooner if your blood sugar goes below 70, or if you have a lot of readings over 200.   Please continue the same medications Please come back for a follow-up appointment in 4 months.    

## 2017-02-24 NOTE — Progress Notes (Signed)
Subjective:    Patient ID: [name] [name], female    DOB: 10-13-1955, 61 y.o.   MRN: [id number]  HPI Pt returns for f/u of diabetes mellitus: DM type: 2 Dx'ed: 7654 Complications: gastgroparesis Therapy: 4 oral meds and victoza.  GDM: never DKA: never.  Severe hypoglycemia: never.  Pancreatitis: never.  Other: she has never been on insulin; diarrhea limits metformin dosage; she did not tolerate parlodel (vomiting).   Interval history: She does not miss med doses.  pt states she feels well in general.  Nausea is resolved, despite taking the full 1.8 mg qd of victoza.   Past Medical History:  Diagnosis Date  . Abdominal pain 08/10/2014  . Anterior neck pain 08/10/2014  . Asthma   . Atypical chest pain    normal coronaries and LV function by cath 10/2011  . Chronic pain disorder    neck and shoulder  . CTS (carpal tunnel syndrome) 01/27/2013   right  . Diabetes mellitus type II   . Diabetic gastroparesis ([place])   . Esophageal reflux 08/25/2013   [facility] [place], Dr Barth Kirks  . Fatty liver    abnormal transaminases; negative work up  . Fundic gland polyps of stomach, benign   . GERD (gastroesophageal reflux disease)   . Hip pain, bilateral 07/19/2012  . HLD (hyperlipidemia)   . HTN (hypertension)   . Hypokalemia 05/25/2013   Improved stopping HCTZ. Was noted to have an elevated glucose when K was low. Recheck renal next week after starting Maxzide  . Hypomagnesemia 08/10/2014  . Hypothyroidism   . Leg cramps 04/15/2016  . Nausea without vomiting 08/08/2014  . Obesity 10/22/2016  . Osteoarthritis    chronic, right knee  . Other malaise and fatigue 01/27/2013  . Plantar fasciitis   . Preventative health care 04/15/2016  . Rosacea 10/22/2016    Past Surgical History:  Procedure Laterality Date  . ABDOMINAL HYSTERECTOMY  1982  . ABDOMINAL HYSTERECTOMY    . CHOLECYSTECTOMY    . COLONOSCOPY    . ESOPHAGOGASTRODUODENOSCOPY    . KNEE ARTHROSCOPY  1998, [id number]  . LEFT HEART  CATHETERIZATION WITH CORONARY ANGIOGRAM N/A 11/21/2011   Procedure: LEFT HEART CATHETERIZATION WITH CORONARY ANGIOGRAM;  Surgeon: Josue Hector, MD;  Location: [facility] CATH LAB;  Service: Cardiovascular;  Laterality: N/A;  . TONSILLECTOMY  1975  . VESICOVAGINAL FISTULA CLOSURE W/ TAH  1995    Social History   Social History  . Marital status: Married    Spouse name: N/A  . Number of children: 1  . Years of education: N/A   Occupational History  . 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 Smoking status: Never Smoker  . Smokeless tobacco: Never Used  . Alcohol use No  . Drug use: No  . Sexual activity: No   Other Topics Concern  . Not on file   Social History Narrative   Multiple family members with intolerance to statins.    Current Outpatient Prescriptions on File Prior to Visit  Medication Sig Dispense Refill  . Alcohol Swabs (ALCOHOL PADS) 70 % PADS Use prior to checking blood sugars to clean skin    . aspirin 81 MG tablet Take 81 mg by mouth daily.      . B-D ULTRAFINE III SHORT PEN 31G X 8 MM MISC INJECT ONCE DAILY 100 each 3  . FARXIGA 10 MG TABS tablet TAKE 1 TABLET BY MOUTH EVERY DAY 90 tablet 3  . fenofibrate  160 MG tablet TAKE 1 TABLET (160 MG TOTAL) BY MOUTH DAILY. 90 tablet 2  . fluticasone (FLONASE) 50 MCG/ACT nasal spray Place into both nostrils daily.    . Fluticasone-Salmeterol (ADVAIR DISKUS) 250-50 MCG/DOSE AEPB INHALE 1 PUFF INTO LUNGS 2 TIMES DAILY 3 each 3  . Glucosamine-Chondroitin (OSTEO BI-FLEX REGULAR STRENGTH) 250-200 MG TABS Take 1 tablet by mouth 2 (two) times daily.     . metFORMIN (GLUCOPHAGE-XR) 500 MG 24 hr tablet TAKE 2 TABLETS EVERY DAY 180 tablet 3  . metroNIDAZOLE (METROGEL) 1 % gel Apply topically daily. 45 g 3  . Multiple Vitamin (MULTIVITAMIN) tablet Take 1 tablet by mouth daily.    . niacin (NIASPAN) 500 MG CR tablet 1 tab po qhs with lowfat snack and Aspirin 1/2 hour prior  Failed all statins and welchol  (Patient taking differently: Take 500 mg by mouth at bedtime. 1 tab po qhs with lowfat snack and Aspirin 1/2 hour prior  Failed all statins and welchol) 30 tablet 5  . ONE TOUCH ULTRA TEST test strip USE AS DIRECTED ONCE A DAY AND AS NEEDED 100 each 3  . ONETOUCH DELICA LANCETS 27P MISC USE AS DIRECTED ONCE A DAY AND AS NEEDED/DIRECTED 100 each 1  . pantoprazole (PROTONIX) 40 MG tablet Take 1 tablet (40 mg total) by mouth daily. 90 tablet 1  . pioglitazone (ACTOS) 30 MG tablet TAKE 1 TABLET (30 MG TOTAL) BY MOUTH DAILY. 90 tablet 3  . PROAIR HFA 108 (90 Base) MCG/ACT inhaler INHALE 2 PUFFS INTO THE LUNGS EVERY 6 (SIX) HOURS AS NEEDED FOR WHEEZING OR SHORTNESS OF BREATH. 8.5 Inhaler 0  . repaglinide (PRANDIN) 1 MG tablet Take 1 tablet (1 mg total) by mouth 3 (three) times daily before meals. 270 tablet 3  . SYNTHROID 88 MCG tablet TAKE 1 TABLET DAILY 90 tablet 1  . triamterene-hydrochlorothiazide (MAXZIDE-25) 37.5-25 MG tablet TAKE 1/2 TABLET BY MOUTH EVERY DAY 45 tablet 3  . VICTOZA 18 MG/3ML SOPN INJECT 1.8MG (0.3ML) DAILY FOR 90 DAYS 9 mL 3   No current facility-administered medications on file prior to visit.     Allergies  Allergen Reactions  . Amoxicillin   . Epinephrine Other (See Comments)    Pass out   . Simvastatin     REACTION: back ache  . Statins     myalgias  . Welchol [Colesevelam Hcl]     myalgia    Family History  Problem Relation Age of Onset  . Alzheimer's disease Mother   . Diabetes type II Mother   . Hiatal hernia Mother   . Diabetes Mother   . Emphysema Father   . Depression Sister        suicide  . Diabetes Sister   . Colon cancer Neg Hx   . Stomach cancer Neg Hx     BP 122/66   Pulse 95   Wt 169 lb (76.7 kg)   SpO2 98%   BMI 27.28 kg/m    Review of Systems She has leg cramps.      Objective:   Physical Exam VITAL SIGNS:  See vs page GENERAL: no distress Pulses: foot pulses are intact bilaterally.   MSK: no deformity of the feet or  ankles.  CV: no edema of the legs or ankles Skin:  no ulcer on the feet or ankles.  normal color and temp on the feet and ankles Neuro: sensation is intact to touch on the feet and ankles.    A1c=6.8%  Lab Results  Component Value Date   PTH 21 08/17/2014   CALCIUM 9.6 01/16/2017   PHOS 3.4 04/29/2014   Lab Results  Component Value Date   CREATININE 1.16 01/16/2017   BUN 21 01/16/2017   NA 138 01/16/2017   K 3.7 01/16/2017   CL 100 01/16/2017   CO2 30 01/16/2017       Assessment & Plan:  Type 2 DM, with grstroparesis: well-controlled.   Leg cramps: no cause is found on legs.   Patient Instructions  check your blood sugar once a day.  vary the time of day when you check, between before the 3 meals, and at bedtime.  also check if you have symptoms of your blood sugar being too high or too low.  please keep a record of the readings and bring it to your next appointment here.  You can write it on any piece of paper.  please call us sooner if your blood sugar goes below 70, or if you have a lot of readings over 200.   Please continue the same medications. Please come back for a follow-up appointment in 4 months.

## 2017-03-10 ENCOUNTER — Other Ambulatory Visit: Payer: Self-pay | Admitting: Family Medicine

## 2017-04-14 ENCOUNTER — Ambulatory Visit: Payer: BLUE CROSS/BLUE SHIELD | Admitting: Family Medicine

## 2017-04-14 ENCOUNTER — Encounter: Payer: Self-pay | Admitting: Family Medicine

## 2017-04-14 ENCOUNTER — Ambulatory Visit (HOSPITAL_BASED_OUTPATIENT_CLINIC_OR_DEPARTMENT_OTHER)
Admission: RE | Admit: 2017-04-14 | Discharge: 2017-04-14 | Disposition: A | Discharge status: Home / Self Care | Payer: BLUE CROSS/BLUE SHIELD | Source: Ambulatory Visit | Attending: Family Medicine | Admitting: Family Medicine

## 2017-04-14 VITALS — BP 112/62 | HR 84 | Temp 97.9°F | Ht 66.0 in | Wt 167.1 lb

## 2017-04-14 DIAGNOSIS — M25551 Pain in right hip: Secondary | ICD-10-CM | POA: Diagnosis present

## 2017-04-14 DIAGNOSIS — M7062 Trochanteric bursitis, left hip: Secondary | ICD-10-CM | POA: Diagnosis not present

## 2017-04-14 DIAGNOSIS — M25552 Pain in left hip: Secondary | ICD-10-CM | POA: Diagnosis not present

## 2017-04-14 DIAGNOSIS — M7061 Trochanteric bursitis, right hip: Secondary | ICD-10-CM | POA: Diagnosis not present

## 2017-04-14 MED ORDER — METHYLPREDNISOLONE ACETATE 40 MG/ML IJ SUSP
40.0000 mg | Freq: Once | INTRAMUSCULAR | Status: AC
  Administered 2017-04-14: 40 mg via INTRAMUSCULAR

## 2017-04-14 NOTE — Addendum Note (Signed)
Addended by: Sharon Seller B on: 04/14/2017 12:24 PM   Modules accepted: Orders

## 2017-04-14 NOTE — Patient Instructions (Addendum)
Go down for an X-ray of your hips. We will let you know via MyChart or telephone your results.  Ice/cold pack over area for 10-15 min every 2-3 hours while awake.  Let us know if you need anything.

## 2017-04-14 NOTE — Progress Notes (Signed)
Musculoskeletal Exam  Patient: [name] [name] DOB: 01-Dec-1955  DOS: 04/14/2017  SUBJECTIVE:  Chief Complaint:   Chief Complaint  Patient presents with  . Leg Pain  . Hip Pain    [name] [name] is a 61 y.o.  female for evaluation and treatment of b/l pain.   Onset:  3 months ago. Gradual, no injury or change in activity.  Location: anterior thighs Character:  aching and dull  Progression of issue:  has significantly worsened Associated symptoms: Poor ambulation 2/2 pain, some tingling down front of thighs Treatment: to date has been OTC NSAIDS.   Neurovascular symptoms: no weakness or numbness  ROS: Musculoskeletal/Extremities: +b/l hip pain Neurologic: no weakness   Past Medical History:  Diagnosis Date  . Abdominal pain 08/10/2014  . Anterior neck pain 08/10/2014  . Asthma   . Atypical chest pain    normal coronaries and LV function by cath 10/2011  . Chronic pain disorder    neck and shoulder  . CTS (carpal tunnel syndrome) 01/27/2013   right  . Diabetes mellitus type II   . Diabetic gastroparesis ([place])   . Esophageal reflux 08/25/2013   [facility] [place], Dr Barth Kirks  . Fatty liver    abnormal transaminases; negative work up  . Fundic gland polyps of stomach, benign   . GERD (gastroesophageal reflux disease)   . Hip pain, bilateral 07/19/2012  . HLD (hyperlipidemia)   . HTN (hypertension)   . Hypokalemia 05/25/2013   Improved stopping HCTZ. Was noted to have an elevated glucose when K was low. Recheck renal next week after starting Maxzide  . Hypomagnesemia 08/10/2014  . Hypothyroidism   . Leg cramps 04/15/2016  . Nausea without vomiting 08/08/2014  . Obesity 10/22/2016  . Osteoarthritis    chronic, right knee  . Other malaise and fatigue 01/27/2013  . Plantar fasciitis   . Preventative health care 04/15/2016  . Rosacea 10/22/2016    Objective: VITAL SIGNS: BP 112/62 (BP Location: Left Arm, Patient Position: Sitting, Cuff Size: Normal)   Pulse 84   Temp  97.9 F (36.6 C) (Oral)   Ht 5\' 6"  (1.676 m)   Wt 167 lb 2 oz (75.8 kg)   SpO2 97%   BMI 26.97 kg/m  Constitutional: Well formed, well developed. No acute distress. Cardiovascular: Brisk cap refill Thorax & Lungs: No accessory muscle use Extremities: No clubbing. No cyanosis. No edema.  Skin: Warm. Dry. No erythema. No rash.  Musculoskeletal: b/l hips.   Normal active range of motion: yes.   Normal passive range of motion: yes Tenderness to palpation: Yes over hip flexors and great troch bursa b/l Deformity: no Ecchymosis: no Tests positive: [place] b/l Tests negative: FADDIR, FABBER, log roll 5/5 strength throughout in LE's +C sign b/l Neurologic: Normal sensory function. No focal deficits noted. DTR's equal and symmetry in LE's. No clonus. Psychiatric: Normal mood. Age appropriate judgment and insight. Alert & oriented x 3.    Procedure note: Greater trochanteric bursa injection, bilateral Verbal consent obtained. The area of interest was palpated and demarcated with an otoscope speculum. It was cleaned with an alcohol swab. Freeze spray was used. A 27 g needle was inserted at a perpendicular angle through the area of interested. The plunger was withdrawn to ensure our placement was not in a vessel. 2 mL of 1% lidocaine without epi and 40 mg of Depomedrol was injected. A bandaid was placed. This same procedure was repeated on the opposite side. The patient tolerated the procedure well.  There were no complications noted.    Assessment:  Bilateral hip pain - Plan: DG HIPS BILAT W OR W/O PELVIS 3-4 VIEWS  Trochanteric bursitis of both hips - Plan: PR DRAIN/INJECT LARGE JOINT/BURSA  Plan: Ice. NSAIDs prn. Activity as tolerated. XR to r/o OA given C-sign. The TTP over great troch was a surprise to [place] both. Hopefully the injections will provide her some relief.  F/u prn.  The patient voiced understanding and agreement to the plan.   [place],  DO 04/14/17  12:14 PM

## 2017-04-14 NOTE — Progress Notes (Signed)
Pre visit review using our clinic review tool, if applicable. No additional management support is needed unless otherwise documented below in the visit note. 

## 2017-04-15 ENCOUNTER — Other Ambulatory Visit: Payer: Self-pay | Admitting: Family Medicine

## 2017-04-27 ENCOUNTER — Encounter: Payer: Self-pay | Admitting: Family Medicine

## 2017-05-01 ENCOUNTER — Encounter: Payer: BLUE CROSS/BLUE SHIELD | Admitting: Family Medicine

## 2017-05-01 MED ORDER — GLUCOSE BLOOD VI STRP: ORAL_STRIP | 12 refills | Status: DC

## 2017-05-01 MED ORDER — ACCU-CHEK NANO SMARTVIEW W/DEVICE KIT: PACK | 0 refills | Status: DC

## 2017-05-01 MED ORDER — ACCU-CHEK SOFT TOUCH LANCETS MISC: 12 refills | Status: DC

## 2017-05-08 ENCOUNTER — Other Ambulatory Visit: Payer: Self-pay | Admitting: Family Medicine

## 2017-05-22 ENCOUNTER — Other Ambulatory Visit: Payer: Self-pay | Admitting: Endocrinology

## 2017-05-27 DIAGNOSIS — I2510 Atherosclerotic heart disease of native coronary artery without angina pectoris: Secondary | ICD-10-CM

## 2017-05-27 HISTORY — DX: Atherosclerotic heart disease of native coronary artery without angina pectoris: I25.10

## 2017-06-04 ENCOUNTER — Other Ambulatory Visit: Payer: Self-pay

## 2017-06-04 MED ORDER — ACCU-CHEK SOFT TOUCH LANCETS MISC: 12 refills | Status: DC

## 2017-06-09 ENCOUNTER — Other Ambulatory Visit: Payer: Self-pay | Admitting: Family Medicine

## 2017-06-13 ENCOUNTER — Telehealth: Payer: Self-pay | Admitting: Family Medicine

## 2017-06-13 MED ORDER — ACCU-CHEK FASTCLIX LANCETS MISC: 3 refills | Status: DC

## 2017-06-13 NOTE — Telephone Encounter (Signed)
rx sent in 

## 2017-06-13 NOTE — Telephone Encounter (Signed)
Copied from [place] [phone]. Topic: Quick Communication - See Telephone Encounter >> Jun 13, 2017  3:15 PM [place] A wrote: CRM for notification. See Telephone encounter for: [facility] mail order called in [phone]) they stated they need the fast click not the soft click for the diabetic supplies .  Can this be changes ?   Callback [phone] Ref [id number]   06/13/17.

## 2017-06-16 NOTE — Telephone Encounter (Signed)
[facility]  called today 06/16/17 to confirm order as had not received yet----gave verbal.

## 2017-06-23 ENCOUNTER — Other Ambulatory Visit: Payer: Self-pay | Admitting: Endocrinology

## 2017-06-30 ENCOUNTER — Encounter: Payer: Self-pay | Admitting: Family Medicine

## 2017-06-30 MED ORDER — GLUCOSE BLOOD VI STRP: ORAL_STRIP | 12 refills | Status: DC

## 2017-07-08 ENCOUNTER — Encounter: Payer: Self-pay | Admitting: Internal Medicine

## 2017-07-14 ENCOUNTER — Ambulatory Visit: Payer: BLUE CROSS/BLUE SHIELD | Admitting: Family Medicine

## 2017-07-14 ENCOUNTER — Encounter: Payer: Self-pay | Admitting: Family Medicine

## 2017-07-14 VITALS — BP 118/70 | HR 97 | Temp 97.7°F | Ht 66.0 in | Wt 167.2 lb

## 2017-07-14 DIAGNOSIS — G8929 Other chronic pain: Secondary | ICD-10-CM | POA: Diagnosis not present

## 2017-07-14 DIAGNOSIS — M25552 Pain in left hip: Secondary | ICD-10-CM

## 2017-07-14 DIAGNOSIS — M25561 Pain in right knee: Secondary | ICD-10-CM | POA: Diagnosis not present

## 2017-07-14 DIAGNOSIS — L309 Dermatitis, unspecified: Secondary | ICD-10-CM

## 2017-07-14 DIAGNOSIS — M25551 Pain in right hip: Secondary | ICD-10-CM | POA: Diagnosis not present

## 2017-07-14 DIAGNOSIS — M25562 Pain in left knee: Secondary | ICD-10-CM | POA: Diagnosis not present

## 2017-07-14 MED ORDER — TRIAMCINOLONE ACETONIDE 0.1 % EX CREA: 1.0000 "application " | TOPICAL_CREAM | Freq: Two times a day (BID) | CUTANEOUS | 0 refills | Status: AC

## 2017-07-14 NOTE — Progress Notes (Signed)
Pre visit review using our clinic review tool, if applicable. No additional management support is needed unless otherwise documented below in the visit note. 

## 2017-07-14 NOTE — Patient Instructions (Addendum)
Do not use over the counter cortisone cream.   Ice/cold pack over knees and hips for 10-15 min twice daily.  Heat (pad or rice pillow in microwave) over affected legs, 10-15 minutes twice daily.   OK to take Tylenol 1000 mg (2 extra strength tabs) or 975 mg (3 regular strength tabs) every 6 hours as needed.  Let us know if you are interested in injections for your knees or hips.   Knee Exercises It is normal to feel mild stretching, pulling, tightness, or discomfort as you do these exercises, but you should stop right away if you feel sudden pain or your pain gets worse. STRETCHING AND RANGE OF MOTION EXERCISES  These exercises warm up your muscles and joints and improve the movement and flexibility of your knee. These exercises also help to relieve pain, numbness, and tingling. Exercise A: Knee Extension, Prone  1. Lie on your abdomen on a bed. 2. Place your left / right knee just beyond the edge of the surface so your knee is not on the bed. You can put a towel under your left / right thigh just above your knee for comfort. 3. Relax your leg muscles and allow gravity to straighten your knee. You should feel a stretch behind your left / right knee. 4. Hold this position for 30 seconds. 5. Scoot up so your knee is supported between repetitions. Repeat 2 times. Complete this stretch 3 times per week. Exercise B: Knee Flexion, Active    1. Lie on your back with both knees straight. If this causes back discomfort, bend your left / right knee so your foot is flat on the floor. 2. Slowly slide your left / right heel back toward your buttocks until you feel a gentle stretch in the front of your knee or thigh. 3. Hold this position for 30 seconds. 4. Slowly slide your left / right heel back to the starting position. Repeat 2 times. Complete this exercise 3 times per week. Exercise C: Quadriceps, Prone    1. Lie on your abdomen on a firm surface, such as a bed or padded floor. 2. Bend  your left / right knee and hold your ankle. If you cannot reach your ankle or pant leg, loop a belt around your foot and grab the belt instead. 3. Gently pull your heel toward your buttocks. Your knee should not slide out to the side. You should feel a stretch in the front of your thigh and knee. 4. Hold this position for 30 seconds. Repeat 2 times. Complete this stretch 3 times per week. Exercise D: Hamstring, Supine  1. Lie on your back. 2. Loop a belt or towel over the ball of your left / right foot. The ball of your foot is on the walking surface, right under your toes. 3. Straighten your left / right knee and slowly pull on the belt to raise your leg until you feel a gentle stretch behind your knee. ? Do not let your left / right knee bend while you do this. ? Keep your other leg flat on the floor. 4. Hold this position for 30 seconds. Repeat 2 times. Complete this stretch 3 times per week. STRENGTHENING EXERCISES  These exercises build strength and endurance in your knee. Endurance is the ability to use your muscles for a long time, even after they get tired. Exercise E: Quadriceps, Isometric    1. Lie on your back with your left / right leg extended and your other knee bent.  Put a rolled towel or small pillow under your knee if told by your health care provider. 2. Slowly tense the muscles in the front of your left / right thigh. You should see your kneecap slide up toward your hip or see increased dimpling just above the knee. This motion will push the back of the knee toward the floor. 3. For 3 seconds, keep the muscle as tight as you can without increasing your pain. 4. Relax the muscles slowly and completely. Repeat for 10 total reps Repeat 2 ti mes. Complete this exercise 3 times per week. Exercise F: Straight Leg Raises - Quadriceps  1. Lie on your back with your left / right leg extended and your other knee bent. 2. Tense the muscles in the front of your left / right thigh. You  should see your kneecap slide up or see increased dimpling just above the knee. Your thigh may even shake a bit. 3. Keep these muscles tight as you raise your leg 4-6 inches (10-15 cm) off the floor. Do not let your knee bend. 4. Hold this position for 3 seconds. 5. Keep these muscles tense as you lower your leg. 6. Relax your muscles slowly and completely after each repetition. 10 total reps. Repeat 2 times. Complete this exercise 3 times per week.  Exercise G: Hamstring Curls    If told by your health care provider, do this exercise while wearing ankle weights. Begin with 5 lb weights (optional). Then increase the weight by 1 lb (0.5 kg) increments. Do not wear ankle weights that are more than 20 lbs to start with. 1. Lie on your abdomen with your legs straight. 2. Bend your left / right knee as far as you can without feeling pain. Keep your hips flat against the floor. 3. Hold this position for 3 seconds. 4. Slowly lower your leg to the starting position. Repeat for 10 reps.  Repeat 2 times. Complete this exercise 3 times per week. Exercise H: Squats (Quadriceps)  1. Stand in front of a table, with your feet and knees pointing straight ahead. You may rest your hands on the table for balance but not for support. 2. Slowly bend your knees and lower your hips like you are going to sit in a chair. ? Keep your weight over your heels, not over your toes. ? Keep your lower legs upright so they are parallel with the table legs. ? Do not let your hips go lower than your knees. ? Do not bend lower than told by your health care provider. ? If your knee pain increases, do not bend as low. 3. Hold the squat position for 1 second. 4. Slowly push with your legs to return to standing. Do not use your hands to pull yourself to standing. Repeat 2 times. Complete this exercise 3 times per week. Exercise I: Wall Slides (Quadriceps)    1. Lean your back against a smooth wall or door while you walk your  feet out 18-24 inches (46-61 cm) from it. 2. Place your feet hip-width apart. 3. Slowly slide down the wall or door until your knees Repeat 2 times. Complete this exercise every other day. 4. Exercise K: Straight Leg Raises - Hip Abductors  1. Lie on your side with your left / right leg in the top position. Lie so your head, shoulder, knee, and hip line up. You may bend your bottom knee to help you keep your balance. 2. Roll your hips slightly forward so your hips  are stacked directly over each other and your left / right knee is facing forward. 3. Leading with your heel, lift your top leg 4-6 inches (10-15 cm). You should feel the muscles in your outer hip lifting. ? Do not let your foot drift forward. ? Do not let your knee roll toward the ceiling. 4. Hold this position for 3 seconds. 5. Slowly return your leg to the starting position. 6. Let your muscles relax completely after each repetition. 10 total reps. Repeat 2 times. Complete this exercise 3 times per week. Exercise J: Straight Leg Raises - Hip Extensors  1. Lie on your abdomen on a firm surface. You can put a pillow under your hips if that is more comfortable. 2. Tense the muscles in your buttocks and lift your left / right leg about 4-6 inches (10-15 cm). Keep your knee straight as you lift your leg. 3. Hold this position for 3 seconds. 4. Slowly lower your leg to the starting position. 5. Let your leg relax completely after each repetition. Repeat 2 times. Complete this exercise 3 times per week. Document Released: 03/27/2005 Document Revised: 02/05/2016 Document Reviewed: 03/19/2015 Elsevier Interactive Patient Education  2017 [employer].

## 2017-07-14 NOTE — Progress Notes (Signed)
Chief Complaint  Patient presents with  . Follow-up    vaginial itching    [name] [name] is a 62 y.o. female here for a skin complaint.  Duration: 2 weeks Location: ext genitalia Pruritic? Yes Painful? No Drainage? No New soaps/lotions/topicals/detergents? No Sick contacts? No Other associated symptoms: no skin lesions or urinary complaints, d/c Therapies tried thus far: otc cortisone w some relief  Cont to have chronic knee and leg pain. Had injections in bursa in past that was helpful. Worse after she exerts herself, will be sore for a couple days afterwards. No numbness, tingling or weakness. She does not use any medicine at home and does not do any stretches or specific exercises.   ROS:  Const: No fevers Skin: As noted in HPI  Past Medical History:  Diagnosis Date  . Abdominal pain 08/10/2014  . Anterior neck pain 08/10/2014  . Asthma   . Atypical chest pain    normal coronaries and LV function by cath 10/2011  . Chronic pain disorder    neck and shoulder  . CTS (carpal tunnel syndrome) 01/27/2013   right  . Diabetes mellitus type II   . Diabetic gastroparesis ([place])   . Esophageal reflux 08/25/2013   [facility] [place], Dr Barth Kirks  . Fatty liver    abnormal transaminases; negative work up  . Fundic gland polyps of stomach, benign   . GERD (gastroesophageal reflux disease)   . Hip pain, bilateral 07/19/2012  . HLD (hyperlipidemia)   . HTN (hypertension)   . Hypokalemia 05/25/2013   Improved stopping HCTZ. Was noted to have an elevated glucose when K was low. Recheck renal next week after starting Maxzide  . Hypomagnesemia 08/10/2014  . Hypothyroidism   . Leg cramps 04/15/2016  . Nausea without vomiting 08/08/2014  . Obesity 10/22/2016  . Osteoarthritis    chronic, right knee  . Other malaise and fatigue 01/27/2013  . Plantar fasciitis   . Preventative health care 04/15/2016  . Rosacea 10/22/2016   Allergies  Allergen Reactions  . Amoxicillin   . Epinephrine  Other (See Comments)    Pass out   . Simvastatin     REACTION: back ache  . Statins     myalgias  . Welchol [Colesevelam Hcl]     myalgia   Allergies as of 07/14/2017      Reactions   Amoxicillin    Epinephrine Other (See Comments)   Pass out    Simvastatin    REACTION: back ache   Statins    myalgias   Welchol [colesevelam Hcl]    myalgia      Medication List        Accurate as of 07/14/17  9:05 AM. Always use your most recent med list.          ACCU-CHEK FASTCLIX LANCETS Misc Check blood sugar once daily Dz:E11.9   ACCU-CHEK NANO SMARTVIEW w/Device Kit Check blood sugar once daily   Alcohol Pads 70 % Pads Use prior to checking blood sugars to clean skin   aspirin 81 MG tablet Take 81 mg by mouth daily.   B-D ULTRAFINE III SHORT PEN 31G X 8 MM Misc Generic drug:  Insulin Pen Needle INJECT ONCE DAILY   FARXIGA 10 MG Tabs tablet Generic drug:  dapagliflozin propanediol TAKE 1 TABLET BY MOUTH EVERY DAY   fenofibrate 160 MG tablet TAKE 1 TABLET BY MOUTH EVERY DAY   fluticasone 50 MCG/ACT nasal spray Commonly known as:  FLONASE Place into both  nostrils daily.   Fluticasone-Salmeterol 250-50 MCG/DOSE Aepb Commonly known as:  ADVAIR DISKUS INHALE 1 PUFF INTO LUNGS 2 TIMES DAILY   glucose blood test strip Commonly known as:  ACCU-CHEK SMARTVIEW Check blood sugars once daily   levothyroxine 88 MCG tablet Commonly known as:  SYNTHROID Take 1 tablet (88 mcg total) by mouth daily.   metFORMIN 500 MG 24 hr tablet Commonly known as:  GLUCOPHAGE-XR TAKE 2 TABLETS EVERY DAY   metroNIDAZOLE 1 % gel Commonly known as:  METROGEL Apply topically daily.   multivitamin tablet Take 1 tablet by mouth daily.   niacin 500 MG CR tablet Commonly known as:  NIASPAN 1 tab po qhs with lowfat snack and Aspirin 1/2 hour prior  Failed all statins and welchol   OSTEO BI-FLEX REGULAR STRENGTH 250-200 MG Tabs Generic drug:  Glucosamine-Chondroitin Take 1 tablet by  mouth 2 (two) times daily.   pantoprazole 40 MG tablet Commonly known as:  PROTONIX TAKE 1 TABLET BY MOUTH EVERY DAY   pioglitazone 30 MG tablet Commonly known as:  ACTOS TAKE 1 TABLET (30 MG TOTAL) BY MOUTH DAILY.   PROAIR HFA 108 (90 Base) MCG/ACT inhaler Generic drug:  albuterol INHALE 2 PUFFS INTO THE LUNGS EVERY 6 (SIX) HOURS AS NEEDED FOR WHEEZING OR SHORTNESS OF BREATH.   repaglinide 1 MG tablet Commonly known as:  PRANDIN Take 1 tablet (1 mg total) by mouth 3 (three) times daily before meals.   triamcinolone cream 0.1 % Commonly known as:  KENALOG Apply 1 application topically 2 (two) times daily for 10 days.   triamterene-hydrochlorothiazide 37.5-25 MG tablet Commonly known as:  MAXZIDE-25 TAKE 1/2 TABLET BY MOUTH EVERY DAY   VICTOZA 18 MG/3ML Sopn Generic drug:  liraglutide INJECT 1.8MG(0.3ML) DAILY FOR 90 DAYS   VICTOZA 18 MG/3ML Sopn Generic drug:  liraglutide INJECT 1.8MG(0.3ML) DAILY FOR 90 DAYS       BP 118/70 (BP Location: Left Arm, Patient Position: Sitting, Cuff Size: Normal)   Pulse 97   Temp 97.7 F (36.5 C) (Oral)   Ht 5' 6"  (1.676 m)   Wt 167 lb 4 oz (75.9 kg)   SpO2 97%   BMI 26.99 kg/m  Gen: awake, alert, appearing stated age Lungs: No accessory muscle use Skin: Pt declined exam of her genital area as she is not having any changes MSK: +TTP over b/l greater troch bursa and IT bands, mild ttp over b/l calves and thighs, normal ROM, mildly antalgic gait Neuro: No cerebellar signs Psych: Age appropriate judgment and insight  Dermatitis - Plan: triamcinolone cream (KENALOG) 0.1 %  Chronic pain of both knees  Pain of both hip joints  Orders as above. Stronger steroid cream for 10 days. If anything changes, f/u. Offered steroid injections vs referral for jt pain, she declined both at this time. Ice, Tylenol, home stretches/exercises in meantime. F/u prn. The patient voiced understanding and agreement to the plan.  [place], DO 07/14/17 9:05 AM

## 2017-07-25 ENCOUNTER — Ambulatory Visit: Payer: BLUE CROSS/BLUE SHIELD | Admitting: Endocrinology

## 2017-07-25 ENCOUNTER — Encounter: Payer: Self-pay | Admitting: Endocrinology

## 2017-07-25 VITALS — BP 122/62 | HR 94 | Ht 66.0 in | Wt 165.4 lb

## 2017-07-25 DIAGNOSIS — E1143 Type 2 diabetes mellitus with diabetic autonomic (poly)neuropathy: Secondary | ICD-10-CM | POA: Diagnosis not present

## 2017-07-25 LAB — POCT GLYCOSYLATED HEMOGLOBIN (HGB A1C): HEMOGLOBIN A1C: 6.6

## 2017-07-25 NOTE — Patient Instructions (Signed)
check your blood sugar once a day.  vary the time of day when you check, between before the 3 meals, and at bedtime.  also check if you have symptoms of your blood sugar being too high or too low.  please keep a record of the readings and bring it to your next appointment here.  You can write it on any piece of paper.  please call us sooner if your blood sugar goes below 70, or if you have a lot of readings over 200.   Please continue the same medications Please come back for a follow-up appointment in 4 months.    

## 2017-07-25 NOTE — Progress Notes (Signed)
Subjective:    Patient ID: [name] [name], female    DOB: [birth year], 62 y.o.   MRN: [id number]  HPI Pt returns for f/u of diabetes mellitus: DM type: 2 Dx'ed: 3790 Complications: gastroparesis and renal insuff Therapy: 4 oral meds and victoza.  GDM: never DKA: never.  Severe hypoglycemia: never.  Pancreatitis: never.  Other: she has never been on insulin; diarrhea and renal insuff limit metformin dosage; she did not tolerate parlodel (vomiting).   Interval history: She does not miss med doses.  pt states she feels well in general.  Nausea is resolved, despite taking the full 1.8 mg qd of victoza.  Past Medical History:  Diagnosis Date  . Abdominal pain 08/10/2014  . Anterior neck pain 08/10/2014  . Asthma   . Atypical chest pain    normal coronaries and LV function by cath 10/2011  . Chronic pain disorder    neck and shoulder  . CTS (carpal tunnel syndrome) 01/27/2013   right  . Diabetes mellitus type II   . Diabetic gastroparesis ([place])   . Esophageal reflux 08/25/2013   [facility] [place], Dr Barth Kirks  . Fatty liver    abnormal transaminases; negative work up  . Fundic gland polyps of stomach, benign   . GERD (gastroesophageal reflux disease)   . Hip pain, bilateral 07/19/2012  . HLD (hyperlipidemia)   . HTN (hypertension)   . Hypokalemia 05/25/2013   Improved stopping HCTZ. Was noted to have an elevated glucose when K was low. Recheck renal next week after starting Maxzide  . Hypomagnesemia 08/10/2014  . Hypothyroidism   . Leg cramps 04/15/2016  . Nausea without vomiting 08/08/2014  . Obesity 10/22/2016  . Osteoarthritis    chronic, right knee  . Other malaise and fatigue 01/27/2013  . Plantar fasciitis   . Preventative health care 04/15/2016  . Rosacea 10/22/2016    Past Surgical History:  Procedure Laterality Date  . ABDOMINAL HYSTERECTOMY  1982  . ABDOMINAL HYSTERECTOMY    . CHOLECYSTECTOMY    . COLONOSCOPY    . ESOPHAGOGASTRODUODENOSCOPY    . KNEE ARTHROSCOPY   1998, [id number]  . LEFT HEART CATHETERIZATION WITH CORONARY ANGIOGRAM N/A 11/21/2011   Procedure: LEFT HEART CATHETERIZATION WITH CORONARY ANGIOGRAM;  Surgeon: Josue Hector, MD;  Location[facility] CATH LAB;  Service: Cardiovascular;  Laterality: N/A;  . TONSILLECTOMY  1975  . VESICOVAGINAL FISTULA CLOSURE W/ TAH  1995    Social History   Socioeconomic History  . Marital status: Married    Spouse name: Not on file  . Number of children: 1  . Years of education: Not on file  . Highest education level: Not on file  Social Needs  . Financial resource strain: Not on file  . Food insecurity - worry: Not on file  . Food insecurity - inability: Not on file  . Transportation needs - medical: Not on file  . Transportation needs - non-medical: Not on file  Occupational History  . Occupation: IT TECH, admin. assistant    Employer: [employer]  Tobacco Use  . Smoking status: Never Smoker  . Smokeless tobacco: Never Used  Substance and Sexual Activity  . Alcohol use: No  . Drug use: No  . Sexual activity: No  Other Topics Concern  . Not on file  Social History Narrative   Multiple family members with intolerance to statins.    Current Outpatient Medications on File Prior to Visit  Medication Sig Dispense Refill  . ACCU-CHEK FASTCLIX LANCETS MISC  Check blood sugar once daily Dz:E11.9 100 each 3  . Alcohol Swabs (ALCOHOL PADS) 70 % PADS Use prior to checking blood sugars to clean skin    . aspirin 81 MG tablet Take 81 mg by mouth daily.      . B-D ULTRAFINE III SHORT PEN 31G X 8 MM MISC INJECT ONCE DAILY 100 each 3  . Blood Glucose Monitoring Suppl (ACCU-CHEK NANO SMARTVIEW) w/Device KIT Check blood sugar once daily 1 kit 0  . FARXIGA 10 MG TABS tablet TAKE 1 TABLET BY MOUTH EVERY DAY 90 tablet 3  . fenofibrate 160 MG tablet TAKE 1 TABLET BY MOUTH EVERY DAY 90 tablet 0  . fluticasone (FLONASE) 50 MCG/ACT nasal spray Place into both nostrils daily.    . Fluticasone-Salmeterol (ADVAIR  DISKUS) 250-50 MCG/DOSE AEPB INHALE 1 PUFF INTO LUNGS 2 TIMES DAILY 3 each 3  . Glucosamine-Chondroitin (OSTEO BI-FLEX REGULAR STRENGTH) 250-200 MG TABS Take 1 tablet by mouth 2 (two) times daily.     Marland Kitchen glucose blood (ACCU-CHEK SMARTVIEW) test strip Check blood sugars once daily 100 each 12  . levothyroxine (SYNTHROID) 88 MCG tablet Take 1 tablet (88 mcg total) by mouth daily. 90 tablet 1  . metFORMIN (GLUCOPHAGE-XR) 500 MG 24 hr tablet TAKE 2 TABLETS EVERY DAY 180 tablet 3  . metroNIDAZOLE (METROGEL) 1 % gel Apply topically daily. 45 g 3  . Multiple Vitamin (MULTIVITAMIN) tablet Take 1 tablet by mouth daily.    . niacin (NIASPAN) 500 MG CR tablet 1 tab po qhs with lowfat snack and Aspirin 1/2 hour prior  Failed all statins and welchol (Patient taking differently: Take 500 mg by mouth at bedtime. 1 tab po qhs with lowfat snack and Aspirin 1/2 hour prior  Failed all statins and welchol) 30 tablet 5  . pantoprazole (PROTONIX) 40 MG tablet TAKE 1 TABLET BY MOUTH EVERY DAY 90 tablet 1  . pioglitazone (ACTOS) 30 MG tablet TAKE 1 TABLET (30 MG TOTAL) BY MOUTH DAILY. 90 tablet 3  . PROAIR HFA 108 (90 Base) MCG/ACT inhaler INHALE 2 PUFFS INTO THE LUNGS EVERY 6 (SIX) HOURS AS NEEDED FOR WHEEZING OR SHORTNESS OF BREATH. 8.5 Inhaler 0  . repaglinide (PRANDIN) 1 MG tablet Take 1 tablet (1 mg total) by mouth 3 (three) times daily before meals. 270 tablet 3  . triamterene-hydrochlorothiazide (MAXZIDE-25) 37.5-25 MG tablet TAKE 1/2 TABLET BY MOUTH EVERY DAY 45 tablet 3  . VICTOZA 18 MG/3ML SOPN INJECT 1.8MG(0.3ML) DAILY FOR 90 DAYS 9 mL 3   No current facility-administered medications on file prior to visit.     Allergies  Allergen Reactions  . Amoxicillin   . Epinephrine Other (See Comments)    Pass out   . Simvastatin     REACTION: back ache  . Statins     myalgias  . Welchol [Colesevelam Hcl]     myalgia    Family History  Problem Relation Age of Onset  . Alzheimer's disease Mother   .  Diabetes type II Mother   . Hiatal hernia Mother   . Diabetes Mother   . Emphysema Father   . Depression Sister        suicide  . Diabetes Sister   . Colon cancer Neg Hx   . Stomach cancer Neg Hx     BP 122/62 (BP Location: Left Arm, Patient Position: Sitting, Cuff Size: Normal)   Pulse 94   Ht 5' 6"  (1.676 m)   Wt 165 lb 6.4 oz (75 kg)  SpO2 97%   BMI 26.70 kg/m    Review of Systems She denies hypoglycemia and nausea    Objective:   Physical Exam VITAL SIGNS:  See vs page GENERAL: no distress Pulses: dorsalis pedis intact bilat.   MSK: no deformity of the feet CV: no leg edema Skin:  no ulcer on the feet.  normal color and temp on the feet. Neuro: sensation is intact to touch on the feet  Lab Results  Component Value Date   HGBA1C 6.6 07/25/2017   Lab Results  Component Value Date   CREATININE 1.16 01/16/2017   BUN 21 01/16/2017   NA 138 01/16/2017   K 3.7 01/16/2017   CL 100 01/16/2017   CO2 30 01/16/2017       Assessment & Plan:  Type 2 DM, with gastroparesis: Renal insuff: this limits rx options  Patient Instructions  check your blood sugar once a day.  vary the time of day when you check, between before the 3 meals, and at bedtime.  also check if you have symptoms of your blood sugar being too high or too low.  please keep a record of the readings and bring it to your next appointment here.  You can write it on any piece of paper.  please call us sooner if your blood sugar goes below 70, or if you have a lot of readings over 200.   Please continue the same medications. Please come back for a follow-up appointment in 4 months.

## 2017-08-01 ENCOUNTER — Other Ambulatory Visit: Payer: Self-pay | Admitting: Endocrinology

## 2017-08-23 ENCOUNTER — Other Ambulatory Visit: Payer: Self-pay | Admitting: Family Medicine

## 2017-08-23 ENCOUNTER — Other Ambulatory Visit: Payer: Self-pay | Admitting: Endocrinology

## 2017-08-25 ENCOUNTER — Ambulatory Visit: Payer: BLUE CROSS/BLUE SHIELD | Admitting: Family Medicine

## 2017-08-25 ENCOUNTER — Encounter: Payer: Self-pay | Admitting: Family Medicine

## 2017-08-25 ENCOUNTER — Ambulatory Visit (HOSPITAL_BASED_OUTPATIENT_CLINIC_OR_DEPARTMENT_OTHER)
Admission: RE | Admit: 2017-08-25 | Discharge: 2017-08-25 | Disposition: A | Discharge status: Home / Self Care | Payer: BLUE CROSS/BLUE SHIELD | Source: Ambulatory Visit | Attending: Family Medicine | Admitting: Family Medicine

## 2017-08-25 VITALS — BP 138/80 | HR 93 | Temp 98.1°F | Resp 18 | Ht 66.0 in | Wt 167.0 lb

## 2017-08-25 DIAGNOSIS — E039 Hypothyroidism, unspecified: Secondary | ICD-10-CM | POA: Diagnosis not present

## 2017-08-25 DIAGNOSIS — E782 Mixed hyperlipidemia: Secondary | ICD-10-CM

## 2017-08-25 DIAGNOSIS — I10 Essential (primary) hypertension: Secondary | ICD-10-CM | POA: Diagnosis not present

## 2017-08-25 DIAGNOSIS — J040 Acute laryngitis: Secondary | ICD-10-CM

## 2017-08-25 DIAGNOSIS — K219 Gastro-esophageal reflux disease without esophagitis: Secondary | ICD-10-CM

## 2017-08-25 DIAGNOSIS — R079 Chest pain, unspecified: Secondary | ICD-10-CM | POA: Diagnosis not present

## 2017-08-25 DIAGNOSIS — E118 Type 2 diabetes mellitus with unspecified complications: Secondary | ICD-10-CM

## 2017-08-25 DIAGNOSIS — E876 Hypokalemia: Secondary | ICD-10-CM

## 2017-08-25 HISTORY — DX: Acute laryngitis: J04.0

## 2017-08-25 MED ORDER — DOXYCYCLINE HYCLATE 100 MG PO TABS: 100.0000 mg | ORAL_TABLET | Freq: Two times a day (BID) | ORAL | 0 refills | Status: DC

## 2017-08-25 MED ORDER — NITROGLYCERIN 0.4 MG SL SUBL: 0.4000 mg | SUBLINGUAL_TABLET | SUBLINGUAL | 3 refills | Status: DC | PRN

## 2017-08-25 NOTE — Assessment & Plan Note (Signed)
hgba1c acceptable, minimize simple carbs. Increase exercise as tolerated. Continue current meds 

## 2017-08-25 NOTE — Patient Instructions (Addendum)
Try nasal saline twice daily. Allegra twice daily, Flonase or Nasacort daily  Maintain increased rest and hydration, add probiotics, zinc such as Coldeze or Xicam. Treat fevers as needed, Elderberry, Vitamin C 500 mg and Umcka can help boost the immune response against viruses and bacteria  If chest pressure does not resolve seek care   Angina Pectoris Angina pectoris is a very bad feeling in the chest, neck, or arm. Your doctor may call it angina. There are four types of angina. Angina is caused by a lack of blood in the middle and thickest layer of the heart wall (myocardium). Angina may feel like a crushing or squeezing pain in the chest. It may feel like tightness or heavy pressure in the chest. Some people say it feels like gas, heartburn, or indigestion. Some people have symptoms other than pain. These include:  Shortness of breath.  Cold sweats.  Feeling sick to your stomach (nausea).  Feeling light-headed.  Many women have chest discomfort and some of the other symptoms. However, women often have different symptoms, such as:  Feeling tired (fatigue).  Feeling nervous for no reason.  Feeling weak for no reason.  Dizziness or fainting.  Women may have angina without any symptoms. Follow these instructions at home:  Take medicines only as told by your doctor.  Take care of other health issues as told by your doctor. These include: ? High blood pressure (hypertension). ? Diabetes.  Follow a heart-healthy diet. Your doctor can help you to choose healthy food options and make changes.  Talk to your doctor to learn more about healthy cooking methods and use them. These include: ? Roasting. ? Grilling. ? Broiling. ? Baking. ? Poaching. ? Steaming. ? Stir-frying.  Follow an exercise program approved by your doctor.  Keep a healthy weight. Lose weight as told by your doctor.  Rest when you are tired.  Learn to manage stress.  Do not use any tobacco, such as  cigarettes, chewing tobacco, or electronic cigarettes. If you need help quitting, ask your doctor.  If you drink alcohol, and your doctor says it is okay, limit yourself to no more than 1 drink per day. One drink equals 12 ounces of beer, 5 ounces of wine, or 1 ounces of hard liquor.  Stop illegal drug use.  Keep all follow-up visits as told by your doctor. This is important. Do not take these medicines unless your doctor says that you can:  Nonsteroidal anti-inflammatory drugs (NSAIDs). These include: ? Ibuprofen. ? Naproxen. ? Celecoxib.  Vitamin supplements that have vitamin A, vitamin E, or both.  Hormone therapy that contains estrogen with or without progestin.  Get help right away if:  You have pain in your chest, neck, arm, jaw, stomach, or back that: ? Lasts more than a few minutes. ? Comes back. ? Does not get better after you take medicine under your tongue (sublingual nitroglycerin).  You have any of these symptoms for no reason: ? Gas, heartburn, or indigestion. ? Sweating a lot. ? Shortness of breath or trouble breathing. ? Feeling sick to your stomach or throwing up. ? Feeling more tired than usual. ? Feeling nervous or worrying more than usual. ? Feeling weak. ? Diarrhea.  You are suddenly dizzy or light-headed.  You faint or pass out. These symptoms may be an emergency. Do not wait to see if the symptoms will go away. Get medical help right away. Call your local emergency services (911 in the [place] Do not drive yourself to  the hospital. This information is not intended to replace advice given to you by your health care provider. Make sure you discuss any questions you have with your health care provider. Document Released: 10/30/2007 Document Revised: 10/19/2015 Document Reviewed: 09/14/2013 Elsevier Interactive Patient Education  2017 [employer].

## 2017-08-25 NOTE — Assessment & Plan Note (Signed)
On Levothyroxine, continue to monitor 

## 2017-08-25 NOTE — Assessment & Plan Note (Signed)
No c/o

## 2017-08-25 NOTE — Assessment & Plan Note (Signed)
She notes some laryngitis after allergies flared, increase Allegra to bid, can try nasal steroid, nasal saline and given Doxycline if it worsens

## 2017-08-25 NOTE — Assessment & Plan Note (Signed)
Check magnesium. 

## 2017-08-25 NOTE — Assessment & Plan Note (Signed)
Patient describing episodes of chest pressure lasting less than 1 minute off and on over past month. Happening more frequently this past week since her allergies flared. EKG done during an episode today was NSR without acute changes. Is given NTG to use prn and referred to cardiology for further work up, seek further care if symptoms develop and do not resolve with NTG

## 2017-08-25 NOTE — Assessment & Plan Note (Signed)
Encouraged heart healthy diet, increase exercise, avoid trans fats, consider a krill oil cap daily 

## 2017-08-25 NOTE — Progress Notes (Signed)
Patient ID: [name] [name], female   DOB: Jun 21, 1955, 62 y.o.   MRN: [id number]   Subjective:    Patient ID: [name] [name], female    DOB: [birth year], 62 y.o.   MRN: [id number]  Chief Complaint  Patient presents with  . Nasal Congestion    Pt reports constant sneezing, runny nose, chest pressure. Has taken cold medication and allegra. Has less congestion and drainage. Now has hoarseness and continues to have chest pressure.and shortness of breath.    HPI Patient is in today for evaluation of laryngitis initially but during visit she notes recent episodes of chest pressure. Her hoarseness and loss of voice has been present about 10 days since her allergies flared and congestion is up. Usually when this happens she has a sore throat but not this time. No recent febrile illness or hospitalizations. No cough or fevers. Patient describing episodes of chest pressure lasting less than 1 minute off and on over past month. Happening more frequently this past week since her allergies flared. No concurrent symptoms noted. Denies palp/HA/fevers/GI or GU c/o. Taking meds as prescribed  Past Medical History:  Diagnosis Date  . Abdominal pain 08/10/2014  . Anterior neck pain 08/10/2014  . Asthma   . Atypical chest pain    normal coronaries and LV function by cath 10/2011  . Chronic pain disorder    neck and shoulder  . CTS (carpal tunnel syndrome) 01/27/2013   right  . Diabetes mellitus type II   . Diabetic gastroparesis ([place])   . Esophageal reflux 08/25/2013   [place], Dr Barth Kirks  . Fatty liver    abnormal transaminases; negative work up  . Fundic gland polyps of stomach, benign   . GERD (gastroesophageal reflux disease)   . Hip pain, bilateral 07/19/2012  . HLD (hyperlipidemia)   . HTN (hypertension)   . Hypokalemia 05/25/2013   Improved stopping HCTZ. Was noted to have an elevated glucose when K was low. Recheck renal next week after starting Maxzide  . Hypomagnesemia 08/10/2014  .  Hypothyroidism   . Leg cramps 04/15/2016  . Nausea without vomiting 08/08/2014  . Obesity 10/22/2016  . Osteoarthritis    chronic, right knee  . Other malaise and fatigue 01/27/2013  . Plantar fasciitis   . Preventative health care 04/15/2016  . Rosacea 10/22/2016    Past Surgical History:  Procedure Laterality Date  . ABDOMINAL HYSTERECTOMY  1982  . ABDOMINAL HYSTERECTOMY    . CHOLECYSTECTOMY    . COLONOSCOPY    . ESOPHAGOGASTRODUODENOSCOPY    . KNEE ARTHROSCOPY  1998, [id number]  . LEFT HEART CATHETERIZATION WITH CORONARY ANGIOGRAM N/A 11/21/2011   Procedure: LEFT HEART CATHETERIZATION WITH CORONARY ANGIOGRAM;  Surgeon: Josue Hector, MD;  Location: [facility] CATH LAB;  Service: Cardiovascular;  Laterality: N/A;  . TONSILLECTOMY  1975  . VESICOVAGINAL FISTULA CLOSURE W/ TAH  1995    Family History  Problem Relation Age of Onset  . Alzheimer's disease Mother   . Diabetes type II Mother   . Hiatal hernia Mother   . Diabetes Mother   . Emphysema Father   . COPD Father   . Depression Sister        suicide  . Diabetes Sister   . Diabetes Daughter   . Colon cancer Neg Hx   . Stomach cancer Neg Hx     Social History   Socioeconomic History  . Marital status: Married    Spouse name: Not on file  . Number  of children: 1  . Years of education: Not on file  . Highest education level: Not on file  Occupational History  . Occupation: IT TECH, admin. assistant    Employer: [employer]  Social Needs  . Financial resource strain: Not on file  . Food insecurity:    Worry: Not on file    Inability: Not on file  . Transportation needs:    Medical: Not on file    Non-medical: Not on file  Tobacco Use  . Smoking status: Never Smoker  . Smokeless tobacco: Never Used  Substance and Sexual Activity  . Alcohol use: No  . Drug use: No  . Sexual activity: Never  Lifestyle  . Physical activity:    Days per week: Not on file    Minutes per session: Not on file  . Stress: Not on file    Relationships  . Social connections:    Talks on phone: Not on file    Gets together: Not on file    Attends religious service: Not on file    Active member of club or organization: Not on file    Attends meetings of clubs or organizations: Not on file    Relationship status: Not on file  . Intimate partner violence:    Fear of current or ex partner: Not on file    Emotionally abused: Not on file    Physically abused: Not on file    Forced sexual activity: Not on file  Other Topics Concern  . Not on file  Social History Narrative   Multiple family members with intolerance to statins.    Outpatient Medications Prior to Visit  Medication Sig Dispense Refill  . ACCU-CHEK FASTCLIX LANCETS MISC Check blood sugar once daily Dz:E11.9 100 each 3  . Alcohol Swabs (ALCOHOL PADS) 70 % PADS Use prior to checking blood sugars to clean skin    . aspirin 81 MG tablet Take 81 mg by mouth daily.      . B-D ULTRAFINE III SHORT PEN 31G X 8 MM MISC INJECT ONCE DAILY 100 each 3  . Blood Glucose Monitoring Suppl (ACCU-CHEK NANO SMARTVIEW) w/Device KIT Check blood sugar once daily 1 kit 0  . FARXIGA 10 MG TABS tablet TAKE 1 TABLET BY MOUTH EVERY DAY 90 tablet 3  . fenofibrate 160 MG tablet TAKE 1 TABLET BY MOUTH EVERY DAY 90 tablet 0  . fluticasone (FLONASE) 50 MCG/ACT nasal spray Place into both nostrils daily.    . Fluticasone-Salmeterol (ADVAIR DISKUS) 250-50 MCG/DOSE AEPB INHALE 1 PUFF INTO LUNGS 2 TIMES DAILY 3 each 3  . Glucosamine-Chondroitin (OSTEO BI-FLEX REGULAR STRENGTH) 250-200 MG TABS Take 1 tablet by mouth 2 (two) times daily.     Marland Kitchen glucose blood (ACCU-CHEK SMARTVIEW) test strip Check blood sugars once daily 100 each 12  . levothyroxine (SYNTHROID) 88 MCG tablet Take 1 tablet (88 mcg total) by mouth daily. 90 tablet 1  . metFORMIN (GLUCOPHAGE-XR) 500 MG 24 hr tablet TAKE 2 TABLETS EVERY DAY 180 tablet 3  . Multiple Vitamin (MULTIVITAMIN) tablet Take 1 tablet by mouth daily.    . niacin  (NIASPAN) 500 MG CR tablet 1 tab po qhs with lowfat snack and Aspirin 1/2 hour prior  Failed all statins and welchol (Patient taking differently: Take 500 mg by mouth at bedtime. 1 tab po qhs with lowfat snack and Aspirin 1/2 hour prior  Failed all statins and welchol) 30 tablet 5  . pantoprazole (PROTONIX) 40 MG tablet TAKE 1 TABLET  BY MOUTH EVERY DAY 90 tablet 1  . pioglitazone (ACTOS) 30 MG tablet TAKE 1 TABLET (30 MG TOTAL) BY MOUTH DAILY. 90 tablet 3  . PROAIR HFA 108 (90 Base) MCG/ACT inhaler INHALE 2 PUFFS INTO THE LUNGS EVERY 6 (SIX) HOURS AS NEEDED FOR WHEEZING OR SHORTNESS OF BREATH. 8.5 Inhaler 0  . repaglinide (PRANDIN) 0.5 MG tablet TAKE 1 TABLET (0.5 MG TOTAL) BY MOUTH DAILY WITH SUPPER. 90 tablet 0  . repaglinide (PRANDIN) 1 MG tablet Take 1 tablet (1 mg total) by mouth 3 (three) times daily before meals. 270 tablet 3  . triamterene-hydrochlorothiazide (MAXZIDE-25) 37.5-25 MG tablet TAKE 1/2 TABLET BY MOUTH EVERY DAY 45 tablet 3  . VICTOZA 18 MG/3ML SOPN INJECT 1.8MG(0.3ML) DAILY FOR 90 DAYS 9 mL 3  . metroNIDAZOLE (METROGEL) 1 % gel Apply topically daily. (Patient not taking: Reported on 08/25/2017) 45 g 3   No facility-administered medications prior to visit.     Allergies  Allergen Reactions  . Amoxicillin   . Epinephrine Other (See Comments)    Pass out   . Simvastatin     REACTION: back ache  . Statins     myalgias  . Welchol [Colesevelam Hcl]     myalgia    Review of Systems  Constitutional: Negative for fever and malaise/fatigue.  HENT: Positive for congestion.   Eyes: Negative for blurred vision.  Respiratory: Negative for shortness of breath.   Cardiovascular: Positive for chest pain. Negative for palpitations and leg swelling.  Gastrointestinal: Negative for abdominal pain, blood in stool and nausea.  Genitourinary: Negative for dysuria and frequency.  Musculoskeletal: Negative for falls.  Skin: Negative for rash.  Neurological: Negative for dizziness,  loss of consciousness and headaches.  Endo/Heme/Allergies: Negative for environmental allergies.  Psychiatric/Behavioral: Negative for depression. The patient is not nervous/anxious.        Objective:    Physical Exam  Constitutional: She is oriented to person, place, and time. She appears well-developed and well-nourished. No distress.  HENT:  Head: Normocephalic and atraumatic.  Nose: Nose normal.  Eyes: Right eye exhibits no discharge. Left eye exhibits no discharge.  Neck: Normal range of motion. Neck supple.  Cardiovascular: Normal rate and regular rhythm.  No murmur heard. Pulmonary/Chest: Effort normal and breath sounds normal.  Abdominal: Soft. Bowel sounds are normal. There is no tenderness.  Musculoskeletal: She exhibits no edema.  Neurological: She is alert and oriented to person, place, and time.  Skin: Skin is warm and dry.  Nursing note and vitals reviewed.   BP 138/80 (BP Location: Left Arm, Cuff Size: Normal)   Pulse 93   Temp 98.1 F (36.7 C) (Oral)   Resp 18   Ht _0  (1.676 m)   Wt 167 lb (75.8 kg)   SpO2 98%   BMI 26.95 kg/m  Wt Readings from Last 3 Encounters:  08/25/17 167 lb (75.8 kg)  07/25/17 165 lb 6.4 oz (75 kg)  07/14/17 167 lb 4 oz (75.9 kg)     Lab Results  Component Value Date   WBC 6.1 01/16/2017   HGB 11.7 (L) 01/16/2017   HCT 36.5 01/16/2017   PLT 297.0 01/16/2017   GLUCOSE 167 (H) 01/16/2017   CHOL 232 (H) 10/22/2016   TRIG 132.0 10/22/2016   HDL 51.30 10/22/2016   LDLDIRECT 151.0 06/01/2015   LDLCALC 154 (H) 10/22/2016   ALT 16 01/16/2017   AST 19 01/16/2017   NA 138 01/16/2017   K 3.7 01/16/2017   CL 100  01/16/2017   CREATININE 1.16 01/16/2017   BUN 21 01/16/2017   CO2 30 01/16/2017   TSH 3.28 10/22/2016   INR 1.07 11/20/2011   HGBA1C 6.6 07/25/2017   MICROALBUR <0.7 10/22/2016    Lab Results  Component Value Date   TSH 3.28 10/22/2016   Lab Results  Component Value Date   WBC 6.1 01/16/2017   HGB 11.7  (L) 01/16/2017   HCT 36.5 01/16/2017   MCV 80.3 01/16/2017   PLT 297.0 01/16/2017   Lab Results  Component Value Date   NA 138 01/16/2017   K 3.7 01/16/2017   CO2 30 01/16/2017   GLUCOSE 167 (H) 01/16/2017   BUN 21 01/16/2017   CREATININE 1.16 01/16/2017   BILITOT 0.4 01/16/2017   ALKPHOS 42 01/16/2017   AST 19 01/16/2017   ALT 16 01/16/2017   PROT 7.2 01/16/2017   ALBUMIN 4.1 01/16/2017   CALCIUM 9.6 01/16/2017   ANIONGAP 15 11/28/2014   GFR 50.26 (L) 01/16/2017   Lab Results  Component Value Date   CHOL 232 (H) 10/22/2016   Lab Results  Component Value Date   HDL 51.30 10/22/2016   Lab Results  Component Value Date   LDLCALC 154 (H) 10/22/2016   Lab Results  Component Value Date   TRIG 132.0 10/22/2016   Lab Results  Component Value Date   CHOLHDL 5 10/22/2016   Lab Results  Component Value Date   HGBA1C 6.6 07/25/2017       Assessment & Plan:   Problem List Items Addressed This Visit    Esophageal reflux    No c/o      Hypothyroidism    On Levothyroxine, continue to monitor      Hyperlipidemia, mixed    Encouraged heart healthy diet, increase exercise, avoid trans fats, consider a krill oil cap daily      Relevant Medications   nitroGLYCERIN (NITROSTAT) 0.4 MG SL tablet   Other Relevant Orders   Lipid panel   Ambulatory referral to Cardiology   Hypertension   Relevant Medications   nitroGLYCERIN (NITROSTAT) 0.4 MG SL tablet   Other Relevant Orders   Comprehensive metabolic panel   TSH   Ambulatory referral to Cardiology   Chest pain - Primary    Patient describing episodes of chest pressure lasting less than 1 minute off and on over past month. Happening more frequently this past week since her allergies flared. EKG done during an episode today was NSR without acute changes. Is given NTG to use prn and referred to cardiology for further work up, seek further care if symptoms develop and do not resolve with NTG      Relevant Orders    EKG 12-Lead (Completed)   DG Chest 2 View (Completed)   Ambulatory referral to Cardiology   Hypokalemia   Hypomagnesemia    Check magnesium      Relevant Orders   Magnesium   Ambulatory referral to Cardiology   DM (diabetes mellitus), type 2 ([place])    hgba1c acceptable, minimize simple carbs. Increase exercise as tolerated. Continue current meds      Relevant Orders   Ambulatory referral to Cardiology   Laryngitis    She notes some laryngitis after allergies flared, increase Allegra to bid, can try nasal steroid, nasal saline and given Doxycline if it worsens      Relevant Orders   CBC with Differential/Platelet   Sedimentation rate      I have discontinued Dacy Enrico. Olesky's metroNIDAZOLE. I  am also having her start on nitroGLYCERIN and doxycycline. Additionally, I am having her maintain her aspirin, Glucosamine-Chondroitin, Fluticasone-Salmeterol, multivitamin, Alcohol Pads, niacin, fluticasone, VICTOZA, PROAIR HFA, triamterene-hydrochlorothiazide, FARXIGA, repaglinide, pioglitazone, levothyroxine, ACCU-CHEK NANO SMARTVIEW, pantoprazole, B-D ULTRAFINE III SHORT PEN, fenofibrate, ACCU-CHEK FASTCLIX LANCETS, glucose blood, metFORMIN, and repaglinide.  Meds ordered this encounter  Medications  . nitroGLYCERIN (NITROSTAT) 0.4 MG SL tablet    Sig: Place 1 tablet (0.4 mg total) under the tongue every 5 (five) minutes as needed for chest pain.    Dispense:  50 tablet    Refill:  3  . doxycycline (VIBRA-TABS) 100 MG tablet    Sig: Take 1 tablet (100 mg total) by mouth 2 (two) times daily.    Dispense:  20 tablet    Refill:  0    CMA served as scribe during this visit. History, Physical and Plan performed by medical provider. Documentation and orders reviewed and attested to.  Penni Homans, MD

## 2017-08-26 ENCOUNTER — Encounter: Payer: Self-pay | Admitting: Family Medicine

## 2017-08-26 LAB — CBC WITH DIFFERENTIAL/PLATELET
BASOS PCT: 0.4 % (ref 0.0–3.0)
Basophils Absolute: 0 10*3/uL (ref 0.0–0.1)
EOS ABS: 0.1 10*3/uL (ref 0.0–0.7)
EOS PCT: 1.5 % (ref 0.0–5.0)
HCT: 36.8 % (ref 36.0–46.0)
Hemoglobin: 11.7 g/dL — ABNORMAL LOW (ref 12.0–15.0)
LYMPHS ABS: 2 10*3/uL (ref 0.7–4.0)
Lymphocytes Relative: 23.3 % (ref 12.0–46.0)
MCHC: 31.8 g/dL (ref 30.0–36.0)
MCV: 79.1 fl (ref 78.0–100.0)
Monocytes Absolute: 0.6 10*3/uL (ref 0.1–1.0)
Monocytes Relative: 6.9 % (ref 3.0–12.0)
NEUTROS PCT: 67.9 % (ref 43.0–77.0)
Neutro Abs: 5.7 10*3/uL (ref 1.4–7.7)
PLATELETS: 310 10*3/uL (ref 150.0–400.0)
RBC: 4.65 Mil/uL (ref 3.87–5.11)
RDW: 14.5 % (ref 11.5–15.5)
WBC: 8.4 10*3/uL (ref 4.0–10.5)

## 2017-08-26 LAB — COMPREHENSIVE METABOLIC PANEL
ALT: 14 U/L (ref 0–35)
AST: 16 U/L (ref 0–37)
Albumin: 4.1 g/dL (ref 3.5–5.2)
Alkaline Phosphatase: 46 U/L (ref 39–117)
BUN: 25 mg/dL — ABNORMAL HIGH (ref 6–23)
CHLORIDE: 99 meq/L (ref 96–112)
CO2: 30 meq/L (ref 19–32)
Calcium: 10 mg/dL (ref 8.4–10.5)
Creatinine, Ser: 0.96 mg/dL (ref 0.40–1.20)
GFR: 62.41 mL/min (ref >60.00)
GLUCOSE: 158 mg/dL — AB (ref 70–99)
POTASSIUM: 3.9 meq/L (ref 3.5–5.1)
SODIUM: 139 meq/L (ref 135–145)
Total Bilirubin: 0.4 mg/dL (ref 0.2–1.2)
Total Protein: 7.5 g/dL (ref 6.0–8.3)

## 2017-08-26 LAB — LIPID PANEL
CHOL/HDL RATIO: 4
Cholesterol: 220 mg/dL — ABNORMAL HIGH (ref 0–200)
HDL: 50.2 mg/dL (ref >39.00)
LDL CALC: 145 mg/dL — AB (ref 0–99)
NONHDL: 169.76
Triglycerides: 124 mg/dL (ref 0.0–149.0)
VLDL: 24.8 mg/dL (ref 0.0–40.0)

## 2017-08-26 LAB — SEDIMENTATION RATE: Sed Rate: 6 mm/hr (ref 0–30)

## 2017-08-26 LAB — TSH: TSH: 2.94 u[IU]/mL (ref 0.35–4.50)

## 2017-08-26 LAB — MAGNESIUM: MAGNESIUM: 1.2 mg/dL — AB (ref 1.5–2.5)

## 2017-08-28 ENCOUNTER — Ambulatory Visit: Payer: BLUE CROSS/BLUE SHIELD | Admitting: Cardiology

## 2017-08-28 VITALS — BP 118/74 | HR 82 | Ht 66.0 in | Wt 165.0 lb

## 2017-08-28 DIAGNOSIS — R079 Chest pain, unspecified: Secondary | ICD-10-CM | POA: Diagnosis not present

## 2017-08-28 DIAGNOSIS — E1143 Type 2 diabetes mellitus with diabetic autonomic (poly)neuropathy: Secondary | ICD-10-CM | POA: Diagnosis not present

## 2017-08-28 DIAGNOSIS — E782 Mixed hyperlipidemia: Secondary | ICD-10-CM | POA: Diagnosis not present

## 2017-08-28 DIAGNOSIS — I10 Essential (primary) hypertension: Secondary | ICD-10-CM | POA: Diagnosis not present

## 2017-08-28 MED ORDER — DILTIAZEM HCL ER COATED BEADS 240 MG PO CP24: 240.0000 mg | ORAL_CAPSULE | Freq: Every day | ORAL | 3 refills | Status: DC

## 2017-08-28 MED ORDER — DILTIAZEM HCL ER COATED BEADS 240 MG PO CP24: 240.0000 mg | ORAL_CAPSULE | Freq: Every day | ORAL | 0 refills | Status: DC

## 2017-08-28 MED ORDER — METOPROLOL TARTRATE 50 MG PO TABS: 50.0000 mg | ORAL_TABLET | Freq: Once | ORAL | 0 refills | Status: DC

## 2017-08-28 NOTE — Progress Notes (Signed)
Cardiology Office Note:    Date:  08/28/2017   ID:  [name] [name], DOB 04-17-1956, MRN [id number]  PCP:  Mosie Lukes, MD  Cardiologist:  Shirlee More, MD   Referring MD: Mosie Lukes, MD  ASSESSMENT:    1. Chest pain, unspecified type   2. Hyperlipidemia, mixed   3. Type 2 diabetes mellitus with diabetic autonomic neuropathy, without long-term current use of insulin ([place])   4. Essential hypertension    PLAN:    In order of problems listed above:  1. She is on a high risk group and having chest pain best typified is atypical angina.  Her EKG had nonspecific T wave changes will draw a troponin today if abnormal we will treat her as acute coronary syndrome with referral to coronary angiography.  I expect it will be normal and placed on a rate limiting calcium channel blocker for antianginal therapy especially with her asthma asked her to continue her current lipid-lowering treatment including fenofibrate and niacin along with aspirin.  She has nitroglycerin to take if an episode does not resolve spontaneously.  We discussed an ischemia evaluation either using a pharmacologic stress test or cardiac CTA she prefers direct referral to coronary CTA and if she has flow-limiting stenosis would benefit from revascularization. 2. Stable continue current treatment if she has CAD I would favor treatment with PC SK 9 agents 3. Stable managed by her PCP she is taking agents that are favorable for preventing cardiovascular complications of diabetes 4. Stable continue her current treatment  2 weeks    Medication Adjustments/Labs and Tests Ordered: Current medicines are reviewed at length with the patient today.  Concerns regarding medicines are outlined above.  No orders of the defined types were placed in this encounter.  No orders of the defined types were placed in this encounter.    Chief Complaint  Patient presents with  . New Patient (Initial Visit)    per Dr Charlett Blake to evaluate  chest pressure, SHOB, and fatigue  . Chest Pain  . Hypertension  . Hyperlipidemia    History of Present Illness:    [name] [name] is a 62 y.o. female with T2 DM hypertension and dyslipidemia who is being seen today for the evaluation of chest discomfort at the request of Mosie Lukes, MD. Recently she has experienced chest discomfort.  She describes as a pressure sensation particularly happens when she is driving the automobile back and forth to work substernal with shortness of breath and resolve spontaneously.  Of 2-5 minutes.  The episodes have not occurred with exertion.  They have been occurring more frequently they have been more severe in nature and happen multiple times per day Monday of this week she had a particularly severe episode that prompted her to seek medical attention.  She is also short of breath with activity such as walking outdoors to the car and is troubled with joint pain lower extremities that limits her activities.  She has had no associated nausea vomiting or diaphoresis she has no history of congenital rheumatic heart disease no palpitation syncope or TIA.  Past Medical History:  Diagnosis Date  . Abdominal pain 08/10/2014  . Allergic rhinitis 08/24/2010  . Anterior neck pain 08/10/2014  . Asthma   . Asthma with acute exacerbation 04/27/2011  . Atypical chest pain    normal coronaries and LV function by cath 10/2011  . BACK PAIN 03/01/2009   Qualifier: Diagnosis of  By: Wynona Luna   .  Cervical cancer screening 10/27/2012   Menarche at 49, regular S/p partial hysterectomy at 26 for precancer changes persistent despite cryo. Both ovaries left in place. No HRT Menopause early 31s G1P1, s/p 1 svd s/p episiomtomy and tear with stitches MGM up to date, h/o calcium deposits repeat MGMs stable   . Chest pain 11/20/2011  . Chronic pain disorder    neck and shoulder  . CTS (carpal tunnel syndrome) 01/27/2013   right  . Diabetes mellitus type II   . Diabetic  gastroparesis ([place])   . Esophageal reflux 08/25/2013   [place] [facility], Dr Barth Kirks  . Fatty liver    abnormal transaminases; negative work up  . FATTY LIVER DISEASE 04/27/2007   Qualifier: Diagnosis of  By: Danelle Earthly CMA, Darlene    . Focal muscle atrophy 09/26/2015  . Fundic gland polyps of stomach, benign   . GERD (gastroesophageal reflux disease)   . Hip pain, bilateral 07/19/2012  . HLD (hyperlipidemia)   . HTN (hypertension)   . Hyperlipidemia, mixed 05/04/2007   Qualifier: Diagnosis of  By: Wynona Luna crestor caused myalgias even at low dose Livalo caused myalgias Lipitor  Mother with severe reaction myalgias Simvastatin, Welchol   . Hypertension 08/24/2010  . Hypokalemia 05/25/2013   Improved stopping HCTZ. Was noted to have an elevated glucose when K was low. Recheck renal next week after starting Maxzide  . Hypomagnesemia 08/10/2014  . Hypothyroidism   . Laryngitis 08/25/2017  . Leg cramps 04/15/2016  . Nausea without vomiting 08/08/2014  . NONSPEC ELEVATION OF LEVELS OF TRANSAMINASE/LDH 05/01/2007   Qualifier: Diagnosis of  By: Garner Gavel    . Obesity 10/22/2016  . Osteoarthritis    chronic, right knee  . Other malaise and fatigue 01/27/2013  . OVARIAN CYST 07/11/2009   Qualifier: Diagnosis of  By: Wynona Luna   . Pain of both thighs 08/08/2014  . Plantar fasciitis   . Preventative health care 04/15/2016  . Rosacea 10/22/2016    Past Surgical History:  Procedure Laterality Date  . ABDOMINAL HYSTERECTOMY  1982  . ABDOMINAL HYSTERECTOMY    . CHOLECYSTECTOMY    . COLONOSCOPY    . ESOPHAGOGASTRODUODENOSCOPY    . KNEE ARTHROSCOPY  1998, [id number]  . LEFT HEART CATHETERIZATION WITH CORONARY ANGIOGRAM N/A 11/21/2011   Procedure: LEFT HEART CATHETERIZATION WITH CORONARY ANGIOGRAM;  Surgeon: Josue Hector, MD;  Location[facility] CATH LAB;  Service: Cardiovascular;  Laterality: N/A;  . TONSILLECTOMY  1975  . VESICOVAGINAL FISTULA CLOSURE W/ TAH  1995    Current  Medications: Current Meds  Medication Sig  . ACCU-CHEK FASTCLIX LANCETS MISC Check blood sugar once daily Dz:E11.9  . Alcohol Swabs (ALCOHOL PADS) 70 % PADS Use prior to checking blood sugars to clean skin  . aspirin 81 MG tablet Take by mouth daily.   . B-D ULTRAFINE III SHORT PEN 31G X 8 MM MISC INJECT ONCE DAILY  . Blood Glucose Monitoring Suppl (ACCU-CHEK NANO SMARTVIEW) w/Device KIT Check blood sugar once daily  . Calcium Carbonate (CALTRATE 600 PO) Take 1 tablet by mouth daily.  Marland Kitchen doxycycline (VIBRA-TABS) 100 MG tablet Take 1 tablet (100 mg total) by mouth 2 (two) times daily.  Marland Kitchen FARXIGA 10 MG TABS tablet TAKE 1 TABLET BY MOUTH EVERY DAY  . fenofibrate 160 MG tablet TAKE 1 TABLET BY MOUTH EVERY DAY  . fluticasone (FLONASE) 50 MCG/ACT nasal spray Place 1 spray into both nostrils daily as needed for allergies.   . Fluticasone-Salmeterol (  ADVAIR DISKUS) 250-50 MCG/DOSE AEPB INHALE 1 PUFF INTO LUNGS 2 TIMES DAILY (Patient taking differently: Inhale 1 puff into the lungs 2 (two) times daily as needed (wheezing). INHALE 1 PUFF INTO LUNGS 2 TIMES DAILY)  . Glucosamine-Chondroitin (OSTEO BI-FLEX REGULAR STRENGTH) 250-200 MG TABS Take 1 tablet by mouth 2 (two) times daily.   Marland Kitchen glucose blood (ACCU-CHEK SMARTVIEW) test strip Check blood sugars once daily  . Krill Oil 300 MG CAPS Take 1 capsule by mouth daily.  Marland Kitchen levothyroxine (SYNTHROID, LEVOTHROID) 88 MCG tablet TAKE 1 TABLET BY MOUTH EVERY DAY  . Magnesium 250 MG TABS Take 1 tablet by mouth daily.  . metFORMIN (GLUCOPHAGE-XR) 500 MG 24 hr tablet TAKE 2 TABLETS EVERY DAY  . Multiple Vitamin (MULTIVITAMIN) tablet Take 1 tablet by mouth daily.  . niacin (NIASPAN) 500 MG CR tablet 1 tab po qhs with lowfat snack and Aspirin 1/2 hour prior  Failed all statins and welchol (Patient taking differently: Take 500 mg by mouth at bedtime. 1 tab po qhs with lowfat snack and Aspirin 1/2 hour prior  Failed all statins and welchol)  . nitroGLYCERIN (NITROSTAT)  0.4 MG SL tablet Place 1 tablet (0.4 mg total) under the tongue every 5 (five) minutes as needed for chest pain.  . pantoprazole (PROTONIX) 40 MG tablet TAKE 1 TABLET BY MOUTH EVERY DAY  . pioglitazone (ACTOS) 30 MG tablet TAKE 1 TABLET (30 MG TOTAL) BY MOUTH DAILY.  Marland Kitchen POTASSIUM CITRATE PO Take 1 tablet by mouth daily.  Marland Kitchen PROAIR HFA 108 (90 Base) MCG/ACT inhaler INHALE 2 PUFFS INTO THE LUNGS EVERY 6 (SIX) HOURS AS NEEDED FOR WHEEZING OR SHORTNESS OF BREATH.  . repaglinide (PRANDIN) 0.5 MG tablet TAKE 1 TABLET (0.5 MG TOTAL) BY MOUTH DAILY WITH SUPPER.  Marland Kitchen triamterene-hydrochlorothiazide (MAXZIDE-25) 37.5-25 MG tablet TAKE 1/2 TABLET BY MOUTH EVERY DAY  . VICTOZA 18 MG/3ML SOPN INJECT 1.8MG(0.3ML) DAILY FOR 90 DAYS     Allergies:   Amoxicillin; Epinephrine; Simvastatin; Statins; and Welchol [colesevelam hcl]   Social History   Socioeconomic History  . Marital status: Married    Spouse name: Not on file  . Number of children: 1  . Years of education: Not on file  . Highest education level: Not on file  Occupational History  . Occupation: IT TECH, admin. assistant    Employer: [employer]  Social Needs  . Financial resource strain: Not on file  . Food insecurity:    Worry: Not on file    Inability: Not on file  . Transportation needs:    Medical: Not on file    Non-medical: Not on file  Tobacco Use  . Smoking status: Never Smoker  . Smokeless tobacco: Never Used  Substance and Sexual Activity  . Alcohol use: No  . Drug use: No  . Sexual activity: Never  Lifestyle  . Physical activity:    Days per week: Not on file    Minutes per session: Not on file  . Stress: Not on file  Relationships  . Social connections:    Talks on phone: Not on file    Gets together: Not on file    Attends religious service: Not on file    Active member of club or organization: Not on file    Attends meetings of clubs or organizations: Not on file    Relationship status: Not on file  Other Topics  Concern  . Not on file  Social History Narrative   Multiple family members with intolerance to  statins.     Family History: The patient's family history includes Alzheimer's disease in her mother; COPD in her father; Depression in her sister; Diabetes in her daughter, mother, and sister; Diabetes type II in her mother; Emphysema in her father; Hiatal hernia in her mother. There is no history of Colon cancer or Stomach cancer.  ROS:   Review of Systems  Constitution: Positive for malaise/fatigue.  HENT: Negative.   Eyes: Negative.   Cardiovascular: Positive for chest pain and dyspnea on exertion.  Respiratory: Positive for shortness of breath (walking outdoor on her driveway).   Endocrine: Negative.   Hematologic/Lymphatic: Negative.   Skin: Negative.   Musculoskeletal: Positive for joint pain.  Gastrointestinal: Positive for nausea.  Genitourinary: Negative.   Neurological: Negative.   Psychiatric/Behavioral: Negative.   Allergic/Immunologic: Negative.    Please see the history of present illness.     All other systems reviewed and are negative.  EKGs/Labs/Other Studies Reviewed:    The following studies were reviewed today:   EKG:  08/25/17 [facility] nonspecific t waves  Recent Labs: 08/25/2017: ALT 14; BUN 25; Creatinine, Ser 0.96; Hemoglobin 11.7; Magnesium 1.2; Platelets 310.0; Potassium 3.9; Sodium 139; TSH 2.94  Recent Lipid Panel    Component Value Date/Time   CHOL 220 (H) 08/25/2017 1620   TRIG 124.0 08/25/2017 1620   HDL 50.20 08/25/2017 1620   CHOLHDL 4 08/25/2017 1620   VLDL 24.8 08/25/2017 1620   LDLCALC 145 (H) 08/25/2017 1620   LDLDIRECT 151.0 06/01/2015 0812    Physical Exam:    VS:  BP 118/74 (BP Location: Right Arm, Patient Position: Sitting, Cuff Size: Normal)   Pulse 82   Ht 5' 6"  (1.676 m)   Wt 165 lb (74.8 kg)   SpO2 99%   BMI 26.63 kg/m     Wt Readings from Last 3 Encounters:  08/28/17 165 lb (74.8 kg)  08/25/17 167 lb (75.8 kg)  07/25/17  165 lb 6.4 oz (75 kg)     GEN:  Well nourished, well developed in no acute distress HEENT: Normal NECK: No JVD; No carotid bruits LYMPHATICS: No lymphadenopathy CARDIAC: RRR, no murmurs, rubs, gallops RESPIRATORY:  Clear to auscultation without rales, wheezing or rhonchi  ABDOMEN: Soft, non-tender, non-distended MUSCULOSKELETAL:  No edema; No deformity  SKIN: Warm and dry NEUROLOGIC:  Alert and oriented x 3 PSYCHIATRIC:  Normal affect     Signed, Shirlee More, MD  08/28/2017 8:35 AM    [place]

## 2017-08-28 NOTE — Patient Instructions (Addendum)
Medication Instructions:  Your physician has recommended you make the following change in your medication:  START diltiazem (Cardizem) 240 mg daily  Labwork: Your physician recommends that you have the following labs drawn: troponin  Testing/Procedures: Your physician has requested that you have cardiac CT. Cardiac computed tomography (CT) is a painless test that uses an x-ray machine to take clear, detailed pictures of your heart. For further information please visit [email or web address][email or web address]. Please follow instruction sheet as given.  Please arrive at the [facility] main entrance of [facility] at xx:xx AM (30-45 minutes prior to test start time)  [facility] [place], [place] [address] [phone]  Proceed to the [facility] Radiology Department (First Floor).  Please follow these instructions carefully (unless otherwise directed):  On the Night Before the Test: . Drink plenty of water. . Do not consume any caffeinated/decaffeinated beverages or chocolate 12 hours prior to your test. . Do not take any antihistamines 12 hours prior to your test. . If you take Metformin do not take 24 hours prior to test.  On the Day of the Test: . Drink plenty of water. Do not drink any water within one hour of the test. . Do not eat any food 4 hours prior to the test. . You may take your regular medications prior to the test. . IF NOT ON A BETA BLOCKER - Take 50 mg of lopressor (metoprolol) one hour before the test.  After the Test: . Drink plenty of water. . After receiving IV contrast, you may experience a mild flushed feeling. This is normal. . On occasion, you may experience a mild rash up to 24 hours after the test. This is not dangerous. If this occurs, you can take Benadryl 25 mg and increase your fluid intake. . If you experience trouble breathing, this can be serious. If it is severe call 911 IMMEDIATELY. If it is mild, please call our office. . If you  take any of these medications: Glipizide/Metformin, Avandament, Glucavance, please do not take 48 hours after completing test.   Follow-Up: Your physician recommends that you schedule a follow-up appointment in: 4 weeks.  Any Other Special Instructions Will Be Listed Below (If Applicable).     If you need a refill on your cardiac medications before your next appointment, please call your pharmacy.

## 2017-08-29 LAB — TROPONIN I: Troponin I: <0.01 ng/mL (ref 0.00–0.04)

## 2017-08-30 ENCOUNTER — Other Ambulatory Visit: Payer: Self-pay | Admitting: Endocrinology

## 2017-09-02 ENCOUNTER — Telehealth: Payer: Self-pay | Admitting: *Deleted

## 2017-09-02 NOTE — Telephone Encounter (Signed)
Spoke with [name] [name]- explained we need to cancel her PV and colon until she has total cardiac clearance- she stated she was thinking about calling and doing this herself- [name] [name] was very appreciative of [place] following up / through with this.  I cancelled her colon 5-10 and her 4-26 PV-  [name] [name] states once she has all cardiac issues resolved she will call back and RS.    Lelan Pons PV

## 2017-09-02 NOTE — Telephone Encounter (Signed)
Dr Carlean Purl,  This pt is scheduled for a colon with you 5-10 FRI.  Her last colon was 2009 with Sharlett Iles and was normal.  On 4-4 she saw cardiology for chest pain.  She had some T wave changes, he did a troponin level that was normal but has ordered a CTA. This has no date as of yet.  She has F/U with cardio 5-13.  Do you want her to cancel her colon 5-10 until after she has all the cardiology studies.  Cardio did mention in note if CTA shows something revascularization may be needed.  Please advise,  Thanks Lelan Pons

## 2017-09-02 NOTE — Telephone Encounter (Signed)
Yes - cancel and reschedule after cardiac w/u ok

## 2017-09-03 ENCOUNTER — Other Ambulatory Visit: Payer: Self-pay | Admitting: Family Medicine

## 2017-09-09 ENCOUNTER — Telehealth: Payer: Self-pay | Admitting: Family Medicine

## 2017-09-09 ENCOUNTER — Other Ambulatory Visit: Payer: Self-pay | Admitting: Family Medicine

## 2017-09-09 DIAGNOSIS — R0602 Shortness of breath: Secondary | ICD-10-CM

## 2017-09-09 DIAGNOSIS — R0600 Dyspnea, unspecified: Secondary | ICD-10-CM

## 2017-09-09 NOTE — Telephone Encounter (Signed)
Copied from [place] [id number]. Topic: Inquiry >> Sep 09, 2017 11:21 AM Margot Ables wrote: Reason for CRM: pt continuing to have SOB and rescue inhaler is not helping. Pt is wondering next step from Dr. Charlett Blake. She is still waiting CT heart w/contrast with Dr. Bettina Gavia. Pt does not appear to be in distress. Pt is requesting a call back.

## 2017-09-09 NOTE — Telephone Encounter (Signed)
So for sob/dyspnea would order echo and if that is unremarkable would refer to pulmonology for further work up

## 2017-09-09 NOTE — Telephone Encounter (Signed)
I did speak with the patient this is a new dx she does not seem distress she has seen for this previously and was wondering what was going on with the referral.   Please advise

## 2017-09-10 ENCOUNTER — Other Ambulatory Visit: Payer: Self-pay | Admitting: Family Medicine

## 2017-09-10 DIAGNOSIS — R0602 Shortness of breath: Secondary | ICD-10-CM

## 2017-09-10 MED ORDER — ALBUTEROL SULFATE HFA 108 (90 BASE) MCG/ACT IN AERS: 2.0000 | INHALATION_SPRAY | Freq: Four times a day (QID) | RESPIRATORY_TRACT | 5 refills | Status: DC | PRN

## 2017-09-10 NOTE — Telephone Encounter (Signed)
Have ordered both

## 2017-09-10 NOTE — Telephone Encounter (Signed)
Spoke with patient she has agreed with the Echo, and also Pulmonology if needed. Will order echo for SOB & Dyspnea?

## 2017-09-11 ENCOUNTER — Telehealth: Payer: Self-pay

## 2017-09-11 ENCOUNTER — Other Ambulatory Visit: Payer: Self-pay | Admitting: Family Medicine

## 2017-09-11 DIAGNOSIS — R0600 Dyspnea, unspecified: Secondary | ICD-10-CM

## 2017-09-11 NOTE — Telephone Encounter (Signed)
Please change order for Echocardiogram to have it done at [facility] [address]  Patient made aware she does not mind driving.   Please advise

## 2017-09-11 NOTE — Telephone Encounter (Signed)
ordered

## 2017-09-22 ENCOUNTER — Other Ambulatory Visit: Payer: Self-pay | Admitting: Endocrinology

## 2017-10-01 ENCOUNTER — Other Ambulatory Visit (INDEPENDENT_AMBULATORY_CARE_PROVIDER_SITE_OTHER): Payer: BLUE CROSS/BLUE SHIELD

## 2017-10-01 ENCOUNTER — Telehealth: Payer: Self-pay | Admitting: Cardiology

## 2017-10-01 ENCOUNTER — Ambulatory Visit: Payer: BLUE CROSS/BLUE SHIELD | Admitting: Pulmonary Disease

## 2017-10-01 ENCOUNTER — Encounter: Payer: Self-pay | Admitting: Pulmonary Disease

## 2017-10-01 VITALS — BP 142/70 | HR 82 | Ht 66.0 in | Wt 171.0 lb

## 2017-10-01 DIAGNOSIS — R0602 Shortness of breath: Secondary | ICD-10-CM | POA: Diagnosis not present

## 2017-10-01 LAB — CBC WITH DIFFERENTIAL/PLATELET
BASOS PCT: 0.9 % (ref 0.0–3.0)
Basophils Absolute: 0.1 10*3/uL (ref 0.0–0.1)
EOS PCT: 1.8 % (ref 0.0–5.0)
Eosinophils Absolute: 0.1 10*3/uL (ref 0.0–0.7)
HCT: 34.2 % — ABNORMAL LOW (ref 36.0–46.0)
Hemoglobin: 11.4 g/dL — ABNORMAL LOW (ref 12.0–15.0)
LYMPHS ABS: 1.6 10*3/uL (ref 0.7–4.0)
Lymphocytes Relative: 24.5 % (ref 12.0–46.0)
MCHC: 33.2 g/dL (ref 30.0–36.0)
MCV: 77.1 fl — AB (ref 78.0–100.0)
MONO ABS: 0.7 10*3/uL (ref 0.1–1.0)
MONOS PCT: 10.5 % (ref 3.0–12.0)
NEUTROS PCT: 62.3 % (ref 43.0–77.0)
Neutro Abs: 4.1 10*3/uL (ref 1.4–7.7)
Platelets: 346 10*3/uL (ref 150.0–400.0)
RBC: 4.44 Mil/uL (ref 3.87–5.11)
RDW: 14.8 % (ref 11.5–15.5)
WBC: 6.5 10*3/uL (ref 4.0–10.5)

## 2017-10-01 LAB — NITRIC OXIDE: Nitric Oxide: 28

## 2017-10-01 MED ORDER — FLUTICASONE FUROATE-VILANTEROL 200-25 MCG/INH IN AEPB: 1.0000 | INHALATION_SPRAY | Freq: Every day | RESPIRATORY_TRACT | 0 refills | Status: AC

## 2017-10-01 MED ORDER — FLUTICASONE FUROATE-VILANTEROL 200-25 MCG/INH IN AEPB: 1.0000 | INHALATION_SPRAY | Freq: Every day | RESPIRATORY_TRACT | 6 refills | Status: DC

## 2017-10-01 NOTE — Telephone Encounter (Signed)
Wants to know what's the hold up on her CT

## 2017-10-01 NOTE — Telephone Encounter (Signed)
Patient advised a message has been sent to the scheduler for follow-up on cardiac CTA. Patient verbalized understanding. No further questions.

## 2017-10-01 NOTE — Patient Instructions (Signed)
We will check blood test today including CBC differential and blood allergy profile and schedule you for pulmonary function test for further evaluation of your asthma. We will start you on controller medication which is a regular inhaler that you use every day to reduce the inflammation in your lungs. Please continue to use your albuterol inhaler as needed Please check with Dr. Bettina Gavia about getting the heart scan scheduled as that will give [place] more information about your shortness of breath I will see you back in 1 month to review results and plan for further steps if needed.

## 2017-10-01 NOTE — Progress Notes (Signed)
[name] [name]    [id number]    1956/01/29  Primary Care Physician:Blyth, Bonnita Levan, MD  Referring Physician: Mosie Lukes, MD [address] [place], [place] [address]  Chief complaint: Consult for dyspnea  HPI: 62 year old with past medical history of asthma, hypertension, diabetes, allergies Complains of of dyspnea on exertion for the since fall 2019.  She describes worsening dyspnea with minimal activity associated with central chest tightness, no radiation and chest pain.  Symptoms do not occur at rest.  She denies any cough, sputum production, fevers, chills.  She has been diagnosed with asthma 7 years ago and is on albuterol rescue inhaler.  She has been using the inhaler several times a day recently with no relief in symptoms  She has been evaluated by Dr. Bettina Gavia, Cardiology with EKG showing nonspecific T wave changes.  CT coronaries are planned for further evaluation of coronary artery disease.  Also placed on calcium channel blocker which has improved her chest pain. Reports sensitivity to flowers, strong aromas, [place] cats, dogs.  She also has seasonal allergies.  She has history of irritable bowel syndrome, diabetic gastroparesis and acid reflux and is on Protonix 40 mg/day.  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  No mold, dampness, Jacuzzis, hot tub Smoking history: Never smoker Travel history: She grew up in [place].  Lived in [place] since 1978.  No recent travel. Relevant family history: Not significant  Outpatient Encounter Medications as of 10/01/2017  Medication Sig  . ACCU-CHEK FASTCLIX LANCETS MISC Check blood sugar once daily Dz:E11.9  . albuterol (PROAIR HFA) 108 (90 Base) MCG/ACT inhaler Inhale 2 puffs into the lungs every 6 (six) hours as needed for wheezing or shortness of breath.  . Alcohol Swabs (ALCOHOL PADS) 70 % PADS Use prior to checking blood sugars to clean skin    . aspirin 81 MG tablet Take by mouth daily.   . B-D ULTRAFINE III SHORT PEN 31G X 8 MM MISC INJECT ONCE DAILY  . Blood Glucose Monitoring Suppl (ACCU-CHEK NANO SMARTVIEW) w/Device KIT Check blood sugar once daily  . Calcium Carbonate (CALTRATE 600 PO) Take 1 tablet by mouth daily.  Marland Kitchen diltiazem (CARDIZEM CD) 240 MG 24 hr capsule Take 1 capsule (240 mg total) by mouth daily.  Marland Kitchen FARXIGA 10 MG TABS tablet TAKE 1 TABLET EVERY DAY  . fenofibrate 160 MG tablet TAKE 1 TABLET BY MOUTH EVERY DAY  . fluticasone (FLONASE) 50 MCG/ACT nasal spray Place 1 spray into both nostrils daily as needed for allergies.   . Fluticasone-Salmeterol (ADVAIR DISKUS) 250-50 MCG/DOSE AEPB INHALE 1 PUFF INTO LUNGS 2 TIMES DAILY (Patient taking differently: Inhale 1 puff into the lungs 2 (two) times daily as needed (wheezing). INHALE 1 PUFF INTO LUNGS 2 TIMES DAILY)  . Glucosamine-Chondroitin (OSTEO BI-FLEX REGULAR STRENGTH) 250-200 MG TABS Take 1 tablet by mouth 2 (two) times daily.   Marland Kitchen glucose blood (ACCU-CHEK SMARTVIEW) test strip Check blood sugars once daily  . Krill Oil 300 MG CAPS Take 1 capsule by mouth daily.  Marland Kitchen levothyroxine (SYNTHROID, LEVOTHROID) 88 MCG tablet TAKE 1 TABLET BY MOUTH EVERY DAY  . Magnesium 250 MG TABS Take 1 tablet by mouth daily.  . metFORMIN (GLUCOPHAGE-XR) 500 MG 24 hr tablet TAKE 2 TABLETS EVERY DAY  . Multiple Vitamin (MULTIVITAMIN) tablet Take 1 tablet by mouth daily.  . niacin (NIASPAN) 500 MG CR tablet 1 tab po  qhs with lowfat snack and Aspirin 1/2 hour prior  Failed all statins and welchol (Patient taking differently: Take 500 mg by mouth at bedtime. 1 tab po qhs with lowfat snack and Aspirin 1/2 hour prior  Failed all statins and welchol)  . nitroGLYCERIN (NITROSTAT) 0.4 MG SL tablet Place 1 tablet (0.4 mg total) under the tongue every 5 (five) minutes as needed for chest pain.  . pantoprazole (PROTONIX) 40 MG tablet TAKE 1 TABLET BY MOUTH EVERY DAY  . pioglitazone (ACTOS) 30 MG tablet  TAKE 1 TABLET (30 MG TOTAL) BY MOUTH DAILY.  Marland Kitchen POTASSIUM CITRATE PO Take 1 tablet by mouth daily.  . repaglinide (PRANDIN) 0.5 MG tablet TAKE 1 TABLET (0.5 MG TOTAL) BY MOUTH DAILY WITH SUPPER.  Marland Kitchen triamterene-hydrochlorothiazide (MAXZIDE-25) 37.5-25 MG tablet TAKE 1/2 TABLET BY MOUTH EVERY DAY  . VICTOZA 18 MG/3ML SOPN INJECT 1.8MG(0.3ML) DAILY FOR 90 DAYS  . [DISCONTINUED] doxycycline (VIBRA-TABS) 100 MG tablet Take 1 tablet (100 mg total) by mouth 2 (two) times daily.  . metoprolol tartrate (LOPRESSOR) 50 MG tablet Take 1 tablet (50 mg total) by mouth once for 1 dose. Take 1 tablet 1 hour before cardiac CTA.   No facility-administered encounter medications on file as of 10/01/2017.     Allergies as of 10/01/2017 - Review Complete 10/01/2017  Allergen Reaction Noted  . Amoxicillin  04/04/2015  . Epinephrine Other (See Comments)   . Simvastatin    . Statins  08/24/2013  . Welchol [colesevelam hcl]  08/24/2013    Past Medical History:  Diagnosis Date  . Abdominal pain 08/10/2014  . Allergic rhinitis 08/24/2010  . Anterior neck pain 08/10/2014  . Asthma   . Asthma with acute exacerbation 04/27/2011  . Atypical chest pain    normal coronaries and LV function by cath 10/2011  . BACK PAIN 03/01/2009   Qualifier: Diagnosis of  By: Wynona Luna   . Cervical cancer screening 10/27/2012   Menarche at 45, regular S/p partial hysterectomy at 26 for precancer changes persistent despite cryo. Both ovaries left in place. No HRT Menopause early 8s G1P1, s/p 1 svd s/p episiomtomy and tear with stitches MGM up to date, h/o calcium deposits repeat MGMs stable   . Chest pain 11/20/2011  . Chronic pain disorder    neck and shoulder  . CTS (carpal tunnel syndrome) 01/27/2013   right  . Diabetes mellitus type II   . Diabetic gastroparesis ([place])   . Esophageal reflux 08/25/2013   [place], Dr Barth Kirks  . Fatty liver    abnormal transaminases; negative work up  . FATTY LIVER DISEASE 04/27/2007     Qualifier: Diagnosis of  By: Danelle Earthly CMA, Darlene    . Focal muscle atrophy 09/26/2015  . Fundic gland polyps of stomach, benign   . GERD (gastroesophageal reflux disease)   . Hip pain, bilateral 07/19/2012  . HLD (hyperlipidemia)   . HTN (hypertension)   . Hyperlipidemia, mixed 05/04/2007   Qualifier: Diagnosis of  By: Wynona Luna crestor caused myalgias even at low dose Livalo caused myalgias Lipitor  Mother with severe reaction myalgias Simvastatin, Welchol   . Hypertension 08/24/2010  . Hypokalemia 05/25/2013   Improved stopping HCTZ. Was noted to have an elevated glucose when K was low. Recheck renal next week after starting Maxzide  . Hypomagnesemia 08/10/2014  . Hypothyroidism   . Laryngitis 08/25/2017  . Leg cramps 04/15/2016  . Nausea without vomiting 08/08/2014  . NONSPEC ELEVATION OF LEVELS OF  TRANSAMINASE/LDH 05/01/2007   Qualifier: Diagnosis of  By: Garner Gavel    . Obesity 10/22/2016  . Osteoarthritis    chronic, right knee  . Other malaise and fatigue 01/27/2013  . OVARIAN CYST 07/11/2009   Qualifier: Diagnosis of  By: Wynona Luna   . Pain of both thighs 08/08/2014  . Plantar fasciitis   . Preventative health care 04/15/2016  . Rosacea 10/22/2016    Past Surgical History:  Procedure Laterality Date  . ABDOMINAL HYSTERECTOMY  1982  . ABDOMINAL HYSTERECTOMY    . CHOLECYSTECTOMY    . COLONOSCOPY    . ESOPHAGOGASTRODUODENOSCOPY    . KNEE ARTHROSCOPY  1998, [id number]  . LEFT HEART CATHETERIZATION WITH CORONARY ANGIOGRAM N/A 11/21/2011   Procedure: LEFT HEART CATHETERIZATION WITH CORONARY ANGIOGRAM;  Surgeon: Josue Hector, MD;  Location: [facility] CATH LAB;  Service: Cardiovascular;  Laterality: N/A;  . TONSILLECTOMY  1975  . VESICOVAGINAL FISTULA CLOSURE W/ TAH  1995    Family History  Problem Relation Age of Onset  . Alzheimer's disease Mother   . Diabetes type II Mother   . Hiatal hernia Mother   . Diabetes Mother   . Emphysema Father   . COPD Father   .  Depression Sister        suicide  . Diabetes Sister   . Diabetes Daughter   . Colon cancer Neg Hx   . Stomach cancer Neg Hx     Social History   Socioeconomic History  . Marital status: Married    Spouse name: Not on file  . Number of children: 1  . Years of education: Not on file  . Highest education level: Not on file  Occupational History  . Occupation: IT TECH, admin. assistant    Employer: [employer]  Social Needs  . Financial resource strain: Not on file  . Food insecurity:    Worry: Not on file    Inability: Not on file  . Transportation needs:    Medical: Not on file    Non-medical: Not on file  Tobacco Use  . Smoking status: Never Smoker  . Smokeless tobacco: Never Used  Substance and Sexual Activity  . Alcohol use: No  . Drug use: No  . Sexual activity: Never  Lifestyle  . Physical activity:    Days per week: Not on file    Minutes per session: Not on file  . Stress: Not on file  Relationships  . Social connections:    Talks on phone: Not on file    Gets together: Not on file    Attends religious service: Not on file    Active member of club or organization: Not on file    Attends meetings of clubs or organizations: Not on file    Relationship status: Not on file  . Intimate partner violence:    Fear of current or ex partner: Not on file    Emotionally abused: Not on file    Physically abused: Not on file    Forced sexual activity: Not on file  Other Topics Concern  . Not on file  Social History Narrative   Multiple family members with intolerance to statins.    Review of systems: Review of Systems  Constitutional: Negative for fever and chills.  HENT: Negative.   Eyes: Negative for blurred vision.  Respiratory: as per HPI  Cardiovascular: Negative for chest pain and palpitations.  Gastrointestinal: Negative for vomiting, diarrhea, blood per rectum. Genitourinary: Negative for dysuria, urgency,  frequency and hematuria.  Musculoskeletal:  Negative for myalgias, back pain and joint pain.  Skin: Negative for itching and rash.  Neurological: Negative for dizziness, tremors, focal weakness, seizures and loss of consciousness.  Endo/Heme/Allergies: Negative for environmental allergies.  Psychiatric/Behavioral: Negative for depression, suicidal ideas and hallucinations.  All other systems reviewed and are negative.  Physical Exam: Blood pressure (!) 142/70, pulse 82, height 5' 6"  (1.676 m), weight 171 lb (77.6 kg), SpO2 97 %. Gen:      No acute distress HEENT:  EOMI, sclera anicteric Neck:     No masses; no thyromegaly Lungs:    Clear to auscultation bilaterally; normal respiratory effort CV:         Regular rate and rhythm; no murmurs Abd:      + bowel sounds; soft, non-tender; no palpable masses, no distension Ext:    No edema; adequate peripheral perfusion Skin:      Warm and dry; no rash Neuro: alert and oriented x 3 Psych: normal mood and affect  Data Reviewed: FENO 10/01/17- 28  CT abdomen 11/28/2014-visualized lung bases are clear Chest x-ray 08/25/2017-no active cardiopulmonary disease I have reviewed the images personally.  Assessment:  Evaluation for dyspnea on exertion She has history of asthma and could have worsening disease with allergies although symptoms are not very typical FENO is slightly elevated in office today.  I will check CBC differential, blood allergy profile and PFTs for further evaluation We will give her a trial of Breo inhaler.  She will continue on albuterol rescue inhaler  There is no evidence of interstitial lung disease on recent chest x-ray and previous CT abdomen in 2016.  She is scheduled for a CT of the coronaries which will give [place] an idea of her lungs as well.  If this shows any lung abnormality or if PFTs show restriction with diffusion impairment then we will plan on getting a high-resolution CT of the chest.  GERD Continues on Protonix with good control of  symptoms.  Plan/Recommendations: - Start Breo inhaler.  Continue albuterol - Check CBC differential, blood allergy profile, PFTs  Marshell Garfinkel MD [facility] 10/01/2017, 9:58 AM  CC: Mosie Lukes, MD

## 2017-10-02 LAB — RESPIRATORY ALLERGY PROFILE REGION II ~~LOC~~
Allergen, Cedar tree, t12: <0.1 kU/L
Allergen, Cottonwood, t14: <0.1 kU/L
Allergen, D pternoyssinus,d7: <0.1 kU/L
Allergen, Mouse Urine Protein, e78: <0.1 kU/L
Allergen, Mulberry, t76: <0.1 kU/L
Allergen, Oak,t7: <0.1 kU/L
Aspergillus fumigatus, m3: <0.1 kU/L
Bermuda Grass: <0.1 kU/L
Box Elder IgE: <0.1 kU/L
CLASS: 0
CLASS: 0
CLASS: 0
CLASS: 0
CLASS: 0
CLASS: 0
CLASS: 0
CLASS: 0
CLASS: 0
CLASS: 0
CLASS: 0
CLASS: 0
Class: 0
Class: 0
Class: 0
Class: 0
Class: 0
Class: 0
Class: 0
Class: 0
Class: 0
Class: 0
Class: 0
Class: 0
Cockroach: <0.1 kU/L
Dog Dander: <0.1 kU/L
Elm IgE: <0.1 kU/L
IGE (IMMUNOGLOBULIN E), SERUM: 12 kU/L (ref <=114)
Johnson Grass: <0.1 kU/L
Pecan/Hickory Tree IgE: <0.1 kU/L
Rough Pigweed  IgE: <0.1 kU/L
Sheep Sorrel IgE: <0.1 kU/L
Timothy Grass: <0.1 kU/L

## 2017-10-02 LAB — INTERPRETATION:

## 2017-10-03 ENCOUNTER — Encounter: Payer: BLUE CROSS/BLUE SHIELD | Admitting: Internal Medicine

## 2017-10-06 ENCOUNTER — Ambulatory Visit: Payer: BLUE CROSS/BLUE SHIELD | Admitting: Cardiology

## 2017-10-07 ENCOUNTER — Encounter: Payer: Self-pay | Admitting: Family Medicine

## 2017-10-07 ENCOUNTER — Ambulatory Visit: Payer: BLUE CROSS/BLUE SHIELD | Admitting: Family Medicine

## 2017-10-07 ENCOUNTER — Ambulatory Visit (HOSPITAL_BASED_OUTPATIENT_CLINIC_OR_DEPARTMENT_OTHER)
Admission: RE | Admit: 2017-10-07 | Discharge: 2017-10-07 | Disposition: A | Discharge status: Home / Self Care | Payer: BLUE CROSS/BLUE SHIELD | Source: Ambulatory Visit | Attending: Family Medicine | Admitting: Family Medicine

## 2017-10-07 VITALS — BP 122/66 | HR 80 | Temp 97.9°F | Resp 18 | Wt 171.6 lb

## 2017-10-07 DIAGNOSIS — E039 Hypothyroidism, unspecified: Secondary | ICD-10-CM

## 2017-10-07 DIAGNOSIS — K219 Gastro-esophageal reflux disease without esophagitis: Secondary | ICD-10-CM | POA: Diagnosis not present

## 2017-10-07 DIAGNOSIS — I313 Pericardial effusion (noninflammatory): Secondary | ICD-10-CM | POA: Diagnosis not present

## 2017-10-07 DIAGNOSIS — R0600 Dyspnea, unspecified: Secondary | ICD-10-CM | POA: Diagnosis not present

## 2017-10-07 DIAGNOSIS — I10 Essential (primary) hypertension: Secondary | ICD-10-CM

## 2017-10-07 DIAGNOSIS — D649 Anemia, unspecified: Secondary | ICD-10-CM | POA: Diagnosis not present

## 2017-10-07 DIAGNOSIS — R0602 Shortness of breath: Secondary | ICD-10-CM

## 2017-10-07 DIAGNOSIS — J45901 Unspecified asthma with (acute) exacerbation: Secondary | ICD-10-CM

## 2017-10-07 DIAGNOSIS — R079 Chest pain, unspecified: Secondary | ICD-10-CM | POA: Insufficient documentation

## 2017-10-07 DIAGNOSIS — I119 Hypertensive heart disease without heart failure: Secondary | ICD-10-CM | POA: Diagnosis not present

## 2017-10-07 DIAGNOSIS — E782 Mixed hyperlipidemia: Secondary | ICD-10-CM

## 2017-10-07 DIAGNOSIS — E119 Type 2 diabetes mellitus without complications: Secondary | ICD-10-CM | POA: Diagnosis not present

## 2017-10-07 NOTE — Progress Notes (Signed)
Subjective:  I acted as a Education administrator for Dr. Charlett Blake. [place], [place]  Patient ID: [name] [name], female    DOB: 09-12-1955, 62 y.o.   MRN: [id number]  No chief complaint on file.   HPI  Patient is in today for a 6 week follow up and she continues to have episodes of chest discomfort she often blames her reflux. She notes episdodes of chest pressure that do not have a pattern. She was using Breo for her breathing but she feels it has caused hoarseness and weakness. Denies HA/congestion/fevers/GI or GU c/o. Taking meds as prescribed. She does have some shortness of breat at times.   Patient Care Team: Mosie Lukes, MD as PCP - General (Family Medicine) Herminio Commons, DO (Osteopathic Medicine)   Past Medical History:  Diagnosis Date  . Abdominal pain 08/10/2014  . Allergic rhinitis 08/24/2010  . Anterior neck pain 08/10/2014  . Asthma   . Asthma with acute exacerbation 04/27/2011  . Atypical chest pain    normal coronaries and LV function by cath 10/2011  . BACK PAIN 03/01/2009   Qualifier: Diagnosis of  By: Wynona Luna   . Cervical cancer screening 10/27/2012   Menarche at 33, regular S/p partial hysterectomy at 26 for precancer changes persistent despite cryo. Both ovaries left in place. No HRT Menopause early 37s G1P1, s/p 1 svd s/p episiomtomy and tear with stitches MGM up to date, h/o calcium deposits repeat MGMs stable   . Chest pain 11/20/2011  . Chronic pain disorder    neck and shoulder  . CTS (carpal tunnel syndrome) 01/27/2013   right  . Diabetes mellitus type II   . Diabetic gastroparesis ([place])   . Esophageal reflux 08/25/2013   [place], Dr Barth Kirks  . Fatty liver    abnormal transaminases; negative work up  . FATTY LIVER DISEASE 04/27/2007   Qualifier: Diagnosis of  By: Danelle Earthly CMA, Darlene    . Focal muscle atrophy 09/26/2015  . Fundic gland polyps of stomach, benign   . GERD (gastroesophageal reflux disease)   . Hip pain, bilateral 07/19/2012  . HLD  (hyperlipidemia)   . HTN (hypertension)   . Hyperlipidemia, mixed 05/04/2007   Qualifier: Diagnosis of  By: Wynona Luna crestor caused myalgias even at low dose Livalo caused myalgias Lipitor  Mother with severe reaction myalgias Simvastatin, Welchol   . Hypertension 08/24/2010  . Hypokalemia 05/25/2013   Improved stopping HCTZ. Was noted to have an elevated glucose when K was low. Recheck renal next week after starting Maxzide  . Hypomagnesemia 08/10/2014  . Hypothyroidism   . Laryngitis 08/25/2017  . Leg cramps 04/15/2016  . Nausea without vomiting 08/08/2014  . NONSPEC ELEVATION OF LEVELS OF TRANSAMINASE/LDH 05/01/2007   Qualifier: Diagnosis of  By: Garner Gavel    . Obesity 10/22/2016  . Osteoarthritis    chronic, right knee  . Other malaise and fatigue 01/27/2013  . OVARIAN CYST 07/11/2009   Qualifier: Diagnosis of  By: Wynona Luna   . Pain of both thighs 08/08/2014  . Plantar fasciitis   . Preventative health care 04/15/2016  . Rosacea 10/22/2016    Past Surgical History:  Procedure Laterality Date  . ABDOMINAL HYSTERECTOMY  1982  . ABDOMINAL HYSTERECTOMY    . CHOLECYSTECTOMY    . COLONOSCOPY    . ESOPHAGOGASTRODUODENOSCOPY    . KNEE ARTHROSCOPY  1998, [id number]  . LEFT HEART CATHETERIZATION WITH CORONARY ANGIOGRAM N/A 11/21/2011   Procedure: LEFT  HEART CATHETERIZATION WITH CORONARY ANGIOGRAM;  Surgeon: Josue Hector, MD;  Location[facility] C[facility] LAB;  Service: Cardiovascular;  Laterality: N/A;  . TONSILLECTOMY  1975  . VESICOVAGINAL FISTULA CLOSURE W/ TAH  1995    Family History  Problem Relation Age of Onset  . Alzheimer's disease Mother   . Diabetes type II Mother   . Hiatal hernia Mother   . Diabetes Mother   . Emphysema Father   . COPD Father   . Depression Sister        suicide  . Diabetes Sister   . Diabetes Daughter   . Colon cancer Neg Hx   . Stomach cancer Neg Hx     Social History   Socioeconomic History  . Marital status: Married    Spouse name:  Not on file  . Number of children: 1  . Years of education: Not on file  . Highest education level: Not on file  Occupational History  . Occupation: IT TECH, admin. assistant    Employer: [employer]  Social Needs  . Financial resource strain: Not on file  . Food insecurity:    Worry: Not on file    Inability: Not on file  . Transportation needs:    Medical: Not on file    Non-medical: Not on file  Tobacco Use  . Smoking status: Never Smoker  . Smokeless tobacco: Never Used  Substance and Sexual Activity  . Alcohol use: No  . Drug use: No  . Sexual activity: Never  Lifestyle  . Physical activity:    Days per week: Not on file    Minutes per session: Not on file  . Stress: Not on file  Relationships  . Social connections:    Talks on phone: Not on file    Gets together: Not on file    Attends religious service: Not on file    Active member of club or organization: Not on file    Attends meetings of clubs or organizations: Not on file    Relationship status: Not on file  . Intimate partner violence:    Fear of current or ex partner: Not on file    Emotionally abused: Not on file    Physically abused: Not on file    Forced sexual activity: Not on file  Other Topics Concern  . Not on file  Social History Narrative   Multiple family members with intolerance to statins.    Outpatient Medications Prior to Visit  Medication Sig Dispense Refill  . ACCU-CHEK FASTCLIX LANCETS MISC Check blood sugar once daily Dz:E11.9 100 each 3  . albuterol (PROAIR HFA) 108 (90 Base) MCG/ACT inhaler Inhale 2 puffs into the lungs every 6 (six) hours as needed for wheezing or shortness of breath. 18 g 5  . Alcohol Swabs (ALCOHOL PADS) 70 % PADS Use prior to checking blood sugars to clean skin    . aspirin 81 MG tablet Take by mouth daily.     . B-D ULTRAFINE III SHORT PEN 31G X 8 MM MISC INJECT ONCE DAILY 100 each 3  . Blood Glucose Monitoring Suppl (ACCU-CHEK NANO SMARTVIEW) w/Device KIT  Check blood sugar once daily 1 kit 0  . Calcium Carbonate (CALTRATE 600 PO) Take 1 tablet by mouth daily.    Marland Kitchen diltiazem (CARDIZEM CD) 240 MG 24 hr capsule Take 1 capsule (240 mg total) by mouth daily. 90 capsule 3  . FARXIGA 10 MG TABS tablet TAKE 1 TABLET EVERY DAY 90 tablet 3  .  fenofibrate 160 MG tablet TAKE 1 TABLET BY MOUTH EVERY DAY 90 tablet 0  . fluticasone (FLONASE) 50 MCG/ACT nasal spray Place 1 spray into both nostrils daily as needed for allergies.     . fluticasone furoate-vilanterol (BREO ELLIPTA) 200-25 MCG/INH AEPB Inhale 1 puff into the lungs daily. 60 each 6  . Fluticasone-Salmeterol (ADVAIR DISKUS) 250-50 MCG/DOSE AEPB INHALE 1 PUFF INTO LUNGS 2 TIMES DAILY (Patient taking differently: Inhale 1 puff into the lungs 2 (two) times daily as needed (wheezing). INHALE 1 PUFF INTO LUNGS 2 TIMES DAILY) 3 each 3  . Glucosamine-Chondroitin (OSTEO BI-FLEX REGULAR STRENGTH) 250-200 MG TABS Take 1 tablet by mouth 2 (two) times daily.     Marland Kitchen glucose blood (ACCU-CHEK SMARTVIEW) test strip Check blood sugars once daily 100 each 12  . Krill Oil 300 MG CAPS Take 1 capsule by mouth daily.    Marland Kitchen levothyroxine (SYNTHROID, LEVOTHROID) 88 MCG tablet TAKE 1 TABLET BY MOUTH EVERY DAY 90 tablet 1  . Magnesium 250 MG TABS Take 1 tablet by mouth daily.    . metFORMIN (GLUCOPHAGE-XR) 500 MG 24 hr tablet TAKE 2 TABLETS EVERY DAY 180 tablet 3  . metoprolol tartrate (LOPRESSOR) 50 MG tablet Take 1 tablet (50 mg total) by mouth once for 1 dose. Take 1 tablet 1 hour before cardiac CTA. 1 tablet 0  . Multiple Vitamin (MULTIVITAMIN) tablet Take 1 tablet by mouth daily.    . niacin (NIASPAN) 500 MG CR tablet 1 tab po qhs with lowfat snack and Aspirin 1/2 hour prior  Failed all statins and welchol (Patient taking differently: Take 500 mg by mouth at bedtime. 1 tab po qhs with lowfat snack and Aspirin 1/2 hour prior  Failed all statins and welchol) 30 tablet 5  . nitroGLYCERIN (NITROSTAT) 0.4 MG SL tablet Place 1  tablet (0.4 mg total) under the tongue every 5 (five) minutes as needed for chest pain. 50 tablet 3  . pantoprazole (PROTONIX) 40 MG tablet TAKE 1 TABLET BY MOUTH EVERY DAY 90 tablet 1  . pioglitazone (ACTOS) 30 MG tablet TAKE 1 TABLET (30 MG TOTAL) BY MOUTH DAILY. 90 tablet 3  . POTASSIUM CITRATE PO Take 1 tablet by mouth daily.    . repaglinide (PRANDIN) 0.5 MG tablet TAKE 1 TABLET (0.5 MG TOTAL) BY MOUTH DAILY WITH SUPPER. 90 tablet 0  . triamterene-hydrochlorothiazide (MAXZIDE-25) 37.5-25 MG tablet TAKE 1/2 TABLET BY MOUTH EVERY DAY 45 tablet 3  . VICTOZA 18 MG/3ML SOPN INJECT 1.'8MG'$ (0.3ML) DAILY FOR 90 DAYS 9 mL 3   No facility-administered medications prior to visit.     Allergies  Allergen Reactions  . Amoxicillin   . Epinephrine Other (See Comments)    Pass out   . Simvastatin     REACTION: back ache  . Statins     myalgias  . Welchol [Colesevelam Hcl]     myalgia    Review of Systems  Constitutional: Negative for fever and malaise/fatigue.  HENT: Negative for congestion.   Eyes: Negative for blurred vision.  Respiratory: Positive for shortness of breath.   Cardiovascular: Negative for chest pain, palpitations and leg swelling.  Gastrointestinal: Negative for abdominal pain, blood in stool and nausea.  Genitourinary: Negative for dysuria and frequency.  Musculoskeletal: Negative for falls.  Skin: Negative for rash.  Neurological: Negative for dizziness, loss of consciousness and headaches.  Endo/Heme/Allergies: Negative for environmental allergies.  Psychiatric/Behavioral: Negative for depression. The patient is not nervous/anxious.        Objective:  Physical Exam  BP 122/66 (BP Location: Left Arm, Patient Position: Sitting, Cuff Size: Normal)   Pulse 80   Temp 97.9 F (36.6 C) (Oral)   Resp 18   Wt 171 lb 9.6 oz (77.8 kg)   SpO2 98%   BMI 27.70 kg/m  Wt Readings from Last 3 Encounters:  10/07/17 171 lb 9.6 oz (77.8 kg)  10/01/17 171 lb (77.6 kg)    08/28/17 165 lb (74.8 kg)   BP Readings from Last 3 Encounters:  10/07/17 122/66  10/01/17 (!) 142/70  08/28/17 118/74     Immunization History  Administered Date(s) Administered  . Influenza Split 03/27/2011, 04/08/2012  . Influenza Whole 03/14/2009, 02/06/2010  . Influenza,inj,Quad PF,6+ Mos 01/27/2013, 05/02/2014, 04/12/2017  . Pneumococcal Polysaccharide-23 03/28/2008  . Tdap 09/18/2011  . Zoster 04/15/2016    Health Maintenance  Topic Date Due  . HIV Screening  09/28/1969  . PNEUMOCOCCAL POLYSACCHARIDE VACCINE (2) 03/28/2013  . MAMMOGRAM  07/04/2017  . COLONOSCOPY  09/13/2017  . URINE MICROALBUMIN  10/22/2017  . INFLUENZA VACCINE  12/25/2017  . HEMOGLOBIN A1C  01/25/2018  . OPHTHALMOLOGY EXAM  04/03/2018  . FOOT EXAM  07/26/2018  . PAP SMEAR  04/16/2019  . TETANUS/TDAP  09/17/2021  . Hepatitis C Screening  Completed    Lab Results  Component Value Date   WBC 6.5 10/01/2017   HGB 11.4 (L) 10/01/2017   HCT 34.2 (L) 10/01/2017   PLT 346.0 10/01/2017   GLUCOSE 158 (H) 08/25/2017   CHOL 220 (H) 08/25/2017   TRIG 124.0 08/25/2017   HDL 50.20 08/25/2017   LDLDIRECT 151.0 06/01/2015   LDLCALC 145 (H) 08/25/2017   ALT 14 08/25/2017   AST 16 08/25/2017   NA 139 08/25/2017   K 3.9 08/25/2017   CL 99 08/25/2017   CREATININE 0.96 08/25/2017   BUN 25 (H) 08/25/2017   CO2 30 08/25/2017   TSH 2.94 08/25/2017   INR 1.07 11/20/2011   HGBA1C 6.6 07/25/2017   MICROALBUR <0.7 10/22/2016    Lab Results  Component Value Date   TSH 2.94 08/25/2017   Lab Results  Component Value Date   WBC 6.5 10/01/2017   HGB 11.4 (L) 10/01/2017   HCT 34.2 (L) 10/01/2017   MCV 77.1 (L) 10/01/2017   PLT 346.0 10/01/2017   Lab Results  Component Value Date   NA 139 08/25/2017   K 3.9 08/25/2017   CO2 30 08/25/2017   GLUCOSE 158 (H) 08/25/2017   BUN 25 (H) 08/25/2017   CREATININE 0.96 08/25/2017   BILITOT 0.4 08/25/2017   ALKPHOS 46 08/25/2017   AST 16 08/25/2017   ALT 14  08/25/2017   PROT 7.5 08/25/2017   ALBUMIN 4.1 08/25/2017   CALCIUM 10.0 08/25/2017   ANIONGAP 15 11/28/2014   GFR 62.41 08/25/2017   Lab Results  Component Value Date   CHOL 220 (H) 08/25/2017   Lab Results  Component Value Date   HDL 50.20 08/25/2017   Lab Results  Component Value Date   LDLCALC 145 (H) 08/25/2017   Lab Results  Component Value Date   TRIG 124.0 08/25/2017   Lab Results  Component Value Date   CHOLHDL 4 08/25/2017   Lab Results  Component Value Date   HGBA1C 6.6 07/25/2017         Assessment & Plan:   Problem List Items Addressed This Visit    Esophageal reflux    Avoid offending foods, start probiotics. Do not eat large meals in late evening  and consider raising head of bed.       Hypothyroidism    On Levothyroxine, continue to monitor      Hyperlipidemia, mixed    Encouraged heart healthy diet, increase exercise, avoid trans fats, consider a krill oil cap daily      Hypertension    Well controlled, no changes to meds. Encouraged heart healthy diet such as the DASH diet and exercise as tolerated.       Asthma with acute exacerbation    She notes not tolerating Breo which caused hoarseness, weakness. She agrees to discuss with pulmonology.      Chest pain - Primary    Intermittent episodes she believes are related to reflux but due to the persistent nature. She is referred to cardiology for further evaluation and she is given NTG to use prn and seek care if pain returns and does not remit.       Relevant Orders   Ambulatory referral to Cardiology   Anemia    Increase leafy greens, consider increased lean red meat and using cast iron cookware. Continue to monitor, report any concerns       Other Visit Diagnoses    SOB (shortness of breath)       Relevant Orders   Ambulatory referral to Cardiology      I am having [name] maintain her aspirin, Glucosamine-Chondroitin, Fluticasone-Salmeterol, multivitamin, Alcohol  Pads, niacin, fluticasone, VICTOZA, pioglitazone, ACCU-CHEK NANO SMARTVIEW, pantoprazole, B-D ULTRAFINE III SHORT PEN, ACCU-CHEK FASTCLIX LANCETS, glucose blood, metFORMIN, repaglinide, levothyroxine, nitroGLYCERIN, Magnesium, POTASSIUM CITRATE PO, Calcium Carbonate (CALTRATE 600 PO), Krill Oil, diltiazem, metoprolol tartrate, triamterene-hydrochlorothiazide, fenofibrate, albuterol, FARXIGA, and fluticasone furoate-vilanterol.  No orders of the defined types were placed in this encounter.   CMA served as Education administrator during this visit. History, Physical and Plan performed by medical provider. Documentation and orders reviewed and attested to.  Penni Homans, MD

## 2017-10-07 NOTE — Progress Notes (Signed)
Echocardiogram 2D Echocardiogram has been performed.  [name] [name] 10/07/2017, 2:56 PM

## 2017-10-07 NOTE — Patient Instructions (Signed)
Shortness of Breath, Adult  Shortness of breath means you have trouble breathing. Your lungs are organs for breathing.  Follow these instructions at home:  Pay attention to any changes in your symptoms. Take these actions to help with your condition:  ? Do not smoke. Smoking can cause shortness of breath. If you need help to quit smoking, ask your doctor.  ? Avoid things that can make it harder to breathe, such as:  ? Mold.  ? Dust.  ? Air pollution.  ? Chemical smells.  ? Things that can cause allergy symptoms (allergens), if you have allergies.  ? Keep your living space clean and free of mold and dust.  ? Rest as needed. Slowly return to your usual activities.  ? Take over-the-counter and prescription medicines, including oxygen and inhaled medicines, only as told by your doctor.  ? Keep all follow-up visits as told by your doctor. This is important.  Contact a doctor if:  ? Your condition does not get better as soon as expected.  ? You have a hard time doing your normal activities, even after you rest.  ? You have new symptoms.  Get help right away if:  ? You have trouble breathing when you are resting.  ? You feel light-headed or you faint.  ? You have a cough that is not helped by medicines.  ? You cough up blood.  ? You have pain with breathing.  ? You have pain in your chest, arms, shoulders, or belly (abdomen).  ? You have a fever.  ? You cannot walk up stairs.  ? You cannot exercise the way you normally do.  This information is not intended to replace advice given to you by your health care provider. Make sure you discuss any questions you have with your health care provider.  Document Released: 10/30/2007 Document Revised: 05/30/2016 Document Reviewed: 05/30/2016  Elsevier Interactive Patient Education ? 2017 [employer]

## 2017-10-08 ENCOUNTER — Encounter: Payer: Self-pay | Admitting: Pulmonary Disease

## 2017-10-09 NOTE — Telephone Encounter (Signed)
Dr. Mannam - please advise. Thanks. 

## 2017-10-09 NOTE — Telephone Encounter (Signed)
I have noted your echo and I see that you are scheduled for a cardiac CT later this month.   I agree that you can stop the breo as you are having a reaction to it. We will reviewed the PFTs and determine if we need an alternate inhaler on your return visit.  Marshell Garfinkel MD [facility] 10/09/2017, 10:14 AM

## 2017-10-11 ENCOUNTER — Encounter: Payer: Self-pay | Admitting: Family Medicine

## 2017-10-11 DIAGNOSIS — D649 Anemia, unspecified: Secondary | ICD-10-CM

## 2017-10-12 DIAGNOSIS — D649 Anemia, unspecified: Secondary | ICD-10-CM | POA: Insufficient documentation

## 2017-10-12 NOTE — Assessment & Plan Note (Signed)
Encouraged heart healthy diet, increase exercise, avoid trans fats, consider a krill oil cap daily 

## 2017-10-12 NOTE — Assessment & Plan Note (Signed)
Increase leafy greens, consider increased lean red meat and using cast iron cookware. Continue to monitor, report any concerns 

## 2017-10-12 NOTE — Assessment & Plan Note (Signed)
She notes not tolerating Breo which caused hoarseness, weakness. She agrees to discuss with pulmonology.

## 2017-10-12 NOTE — Assessment & Plan Note (Signed)
On Levothyroxine, continue to monitor 

## 2017-10-12 NOTE — Assessment & Plan Note (Signed)
Well controlled, no changes to meds. Encouraged heart healthy diet such as the DASH diet and exercise as tolerated.  °

## 2017-10-12 NOTE — Assessment & Plan Note (Signed)
Intermittent episodes she believes are related to reflux but due to the persistent nature. She is referred to cardiology for further evaluation and she is given NTG to use prn and seek care if pain returns and does not remit.

## 2017-10-12 NOTE — Assessment & Plan Note (Signed)
Avoid offending foods, start probiotics. Do not eat large meals in late evening and consider raising head of bed.  

## 2017-10-23 ENCOUNTER — Ambulatory Visit: Payer: BLUE CROSS/BLUE SHIELD | Admitting: Cardiology

## 2017-10-27 NOTE — Progress Notes (Signed)
Cardiology Office Note   Date:  10/29/2017   ID:  [name], [name] [birth year], MRN [id number]  PCP:  Mosie Lukes, MD  Cardiologist:   No primary care provider on file. Referring:  Mosie Lukes, MD  Chief Complaint  Patient presents with  . Shortness of Breath      History of Present Illness: [name] [name] is a 62 y.o. female who presents for evaluation of chest pain.  Dr. Bettina Gavia saw her in April.   She had an echo which was unremarkable.  It was suggested that she should have a coronary CTA.     She reports DOE after walking 30 feet on level ground.  She reports that her legs will "blow up" with swelling when she is walking.  She has had an echo with a very small pericardial effusion but no other significant findings.  She has seen Dr. Vaughan Browner and PFTs are pending.  She was unsatisfied that she had to wait for testing and wanted to have a second opinion about possible cardiac etiologies.  She has had no prior cardiac disease.  I do see that she had a cardiac cath in 2013 when she was seen by Dr. Mare Ferrari I the ED for evaluation of chest pain.  This was normal.  There was no CAD.     Past Medical History:  Diagnosis Date  . Abdominal pain 08/10/2014  . Allergic rhinitis 08/24/2010  . Anterior neck pain 08/10/2014  . Asthma   . Asthma with acute exacerbation 04/27/2011  . Atypical chest pain    normal coronaries and LV function by cath 10/2011  . BACK PAIN 03/01/2009   Qualifier: Diagnosis of  By: Wynona Luna   . Cervical cancer screening 10/27/2012   Menarche at 62, regular S/p partial hysterectomy at 26 for precancer changes persistent despite cryo. Both ovaries left in place. No HRT Menopause early 10s G1P1, s/p 1 svd s/p episiomtomy and tear with stitches [facility] up to date, h/o calcium deposits repeat MGMs stable   . Chest pain 11/20/2011  . Chronic pain disorder    neck and shoulder  . CTS (carpal tunnel syndrome) 01/27/2013   right  . Diabetes mellitus type II   .  Diabetic gastroparesis ([place])   . Esophageal reflux 08/25/2013   [facility], Dr Barth Kirks  . Fatty liver    abnormal transaminases; negative work up  . FATTY LIVER DISEASE 04/27/2007   Qualifier: Diagnosis of  By: Danelle Earthly CMA, Darlene    . Focal muscle atrophy 09/26/2015  . Fundic gland polyps of stomach, benign   . GERD (gastroesophageal reflux disease)   . Hip pain, bilateral 07/19/2012  . HLD (hyperlipidemia)   . HTN (hypertension)   . Hyperlipidemia, mixed 05/04/2007   Qualifier: Diagnosis of  By: Wynona Luna crestor caused myalgias even at low dose Livalo caused myalgias Lipitor  Mother with severe reaction myalgias Simvastatin, Welchol   . Hypertension 08/24/2010  . Hypokalemia 05/25/2013   Improved stopping HCTZ. Was noted to have an elevated glucose when K was low. Recheck renal next week after starting Maxzide  . Hypomagnesemia 08/10/2014  . Hypothyroidism   . Laryngitis 08/25/2017  . Leg cramps 04/15/2016  . Nausea without vomiting 08/08/2014  . NONSPEC ELEVATION OF LEVELS OF TRANSAMINASE/LDH 05/01/2007   Qualifier: Diagnosis of  By: Garner Gavel    . Obesity 10/22/2016  . Osteoarthritis    chronic, right knee  . Other malaise  and fatigue 01/27/2013  . OVARIAN CYST 07/11/2009   Qualifier: Diagnosis of  By: Wynona Luna   . Pain of both thighs 08/08/2014  . Plantar fasciitis   . Preventative health care 04/15/2016  . Rosacea 10/22/2016    Past Surgical History:  Procedure Laterality Date  . ABDOMINAL HYSTERECTOMY  1982  . ABDOMINAL HYSTERECTOMY    . CHOLECYSTECTOMY    . COLONOSCOPY    . ESOPHAGOGASTRODUODENOSCOPY    . KNEE ARTHROSCOPY  1998, [id number]  . LEFT HEART CATHETERIZATION WITH CORONARY ANGIOGRAM N/A 11/21/2011   Procedure: LEFT HEART CATHETERIZATION WITH CORONARY ANGIOGRAM;  Surgeon: Josue Hector, MD;  Location: [facility] CATH LAB;  Service: Cardiovascular;  Laterality: N/A;  . TONSILLECTOMY  1975  . VESICOVAGINAL FISTULA CLOSURE W/ TAH  1995     Current  Outpatient Medications  Medication Sig Dispense Refill  . ACCU-CHEK FASTCLIX LANCETS MISC Check blood sugar once daily Dz:E11.9 100 each 3  . albuterol (PROAIR HFA) 108 (90 Base) MCG/ACT inhaler Inhale 2 puffs into the lungs every 6 (six) hours as needed for wheezing or shortness of breath. 18 g 5  . Alcohol Swabs (ALCOHOL PADS) 70 % PADS Use prior to checking blood sugars to clean skin    . aspirin 81 MG tablet Take by mouth daily.     . B-D ULTRAFINE III SHORT PEN 31G X 8 MM MISC INJECT ONCE DAILY 100 each 3  . Blood Glucose Monitoring Suppl (ACCU-CHEK NANO SMARTVIEW) w/Device KIT Check blood sugar once daily 1 kit 0  . Calcium Carbonate (CALTRATE 600 PO) Take 1 tablet by mouth daily.    Marland Kitchen diltiazem (CARDIZEM CD) 240 MG 24 hr capsule Take 1 capsule (240 mg total) by mouth daily. 90 capsule 3  . FARXIGA 10 MG TABS tablet TAKE 1 TABLET EVERY DAY 90 tablet 3  . fenofibrate 160 MG tablet TAKE 1 TABLET BY MOUTH EVERY DAY 90 tablet 0  . fluticasone (FLONASE) 50 MCG/ACT nasal spray Place 1 spray into both nostrils daily as needed for allergies.     . fluticasone furoate-vilanterol (BREO ELLIPTA) 200-25 MCG/INH AEPB Inhale 1 puff into the lungs daily. 60 each 6  . Fluticasone-Salmeterol (ADVAIR DISKUS) 250-50 MCG/DOSE AEPB INHALE 1 PUFF INTO LUNGS 2 TIMES DAILY (Patient taking differently: Inhale 1 puff into the lungs 2 (two) times daily as needed (wheezing). INHALE 1 PUFF INTO LUNGS 2 TIMES DAILY) 3 each 3  . Glucosamine-Chondroitin (OSTEO BI-FLEX REGULAR STRENGTH) 250-200 MG TABS Take 1 tablet by mouth 2 (two) times daily.     Marland Kitchen glucose blood (ACCU-CHEK SMARTVIEW) test strip Check blood sugars once daily 100 each 12  . Krill Oil 300 MG CAPS Take 1 capsule by mouth daily.    Marland Kitchen levothyroxine (SYNTHROID, LEVOTHROID) 88 MCG tablet TAKE 1 TABLET BY MOUTH EVERY DAY 90 tablet 1  . Magnesium 250 MG TABS Take 1 tablet by mouth daily.    . metFORMIN (GLUCOPHAGE-XR) 500 MG 24 hr tablet TAKE 2 TABLETS EVERY DAY  180 tablet 3  . Multiple Vitamin (MULTIVITAMIN) tablet Take 1 tablet by mouth daily.    . niacin (NIASPAN) 500 MG CR tablet 1 tab po qhs with lowfat snack and Aspirin 1/2 hour prior  Failed all statins and welchol (Patient taking differently: Take 500 mg by mouth at bedtime. 1 tab po qhs with lowfat snack and Aspirin 1/2 hour prior  Failed all statins and welchol) 30 tablet 5  . nitroGLYCERIN (NITROSTAT) 0.4 MG SL tablet Place  1 tablet (0.4 mg total) under the tongue every 5 (five) minutes as needed for chest pain. 50 tablet 3  . pantoprazole (PROTONIX) 40 MG tablet TAKE 1 TABLET BY MOUTH EVERY DAY 90 tablet 1  . pioglitazone (ACTOS) 30 MG tablet TAKE 1 TABLET (30 MG TOTAL) BY MOUTH DAILY. 90 tablet 3  . POTASSIUM CITRATE PO Take 1 tablet by mouth daily.    . repaglinide (PRANDIN) 0.5 MG tablet TAKE 1 TABLET (0.5 MG TOTAL) BY MOUTH DAILY WITH SUPPER. 90 tablet 0  . triamterene-hydrochlorothiazide (MAXZIDE-25) 37.5-25 MG tablet TAKE 1/2 TABLET BY MOUTH EVERY DAY 45 tablet 3  . VICTOZA 18 MG/3ML SOPN INJECT 1.8MG(0.3ML) DAILY FOR 90 DAYS 9 mL 3  . metoprolol tartrate (LOPRESSOR) 50 MG tablet Take 1 tablet (50 mg total) by mouth once for 1 dose. Take 1 tablet 1 hour before cardiac CTA. 1 tablet 0   No current facility-administered medications for this visit.     Allergies:   Amoxicillin; Epinephrine; Simvastatin; Statins; and Welchol [colesevelam hcl]     ROS:  Please see the history of present illness.   Otherwise, review of systems are positive for none.   All other systems are reviewed and negative.    PHYSICAL EXAM: VS:  BP 124/72 (BP Location: Left Arm)   Pulse 72   Ht _0  (1.676 m)   Wt 171 lb 12.8 oz (77.9 kg)   BMI 27.73 kg/m  , BMI Body mass index is 27.73 kg/m. GENERAL:  Well appearing NECK:  No jugular venous distention, waveform within normal limits, carotid upstroke brisk and symmetric, no bruits, no thyromegaly LUNGS:  Clear to auscultation bilaterally BACK:  No CVA  tenderness CHEST:  Unremarkable HEART:  PMI not displaced or sustained,S1 and S2 within normal limits, no S3, no S4, no clicks, no rubs, no murmurs ABD:  Flat, positive bowel sounds normal in frequency in pitch, no bruits, no rebound, no guarding, no midline pulsatile mass, no hepatomegaly, no splenomegaly EXT:  2 plus pulses throughout, no edema, no cyanosis no clubbing   EKG:  EKG is not ordered today.    Recent Labs: 08/25/2017: ALT 14; BUN 25; Creatinine, Ser 0.96; Magnesium 1.2; Potassium 3.9; Sodium 139; TSH 2.94 10/01/2017: Hemoglobin 11.4; Platelets 346.0    Lipid Panel    Component Value Date/Time   CHOL 220 (H) 08/25/2017 1620   TRIG 124.0 08/25/2017 1620   HDL 50.20 08/25/2017 1620   CHOLHDL 4 08/25/2017 1620   VLDL 24.8 08/25/2017 1620   LDLCALC 145 (H) 08/25/2017 1620   LDLDIRECT 151.0 06/01/2015 0812      Wt Readings from Last 3 Encounters:  10/29/17 171 lb 12.8 oz (77.9 kg)  10/07/17 171 lb 9.6 oz (77.8 kg)  10/01/17 171 lb (77.6 kg)      Other studies Reviewed: Additional studies/ records that were reviewed today include: I reviewed the recent cardiac note, echo result, EKG, past cath report and consult note. Review of the above records demonstrates:  Please see elsewhere in the note.     ASSESSMENT AND PLAN:  SOB:  I walked her around the office and she report that her legs were swelling and she was very very SOB.  However, her sats were 98 - 99% the entire time.  I will wait for the results of the CT and the PFTs before suggesting other testing or treatment.  At this time I have a low suspicion to suspect a cardiac etiology based on the echo and previous  cath.    HTN:  The blood pressure is at target. No change in medications is indicated. We will continue with therapeutic lifestyle changes (TLC).  HYPERLIPIDEMIA:  She does not tolerate statin and would not need treatment if she has no CAD on the study as above.    Current medicines are reviewed at  length with the patient today.  The patient does not have concerns regarding medicines.  The following changes have been made:  no change  Labs/ tests ordered today include: None No orders of the defined types were placed in this encounter.    Disposition:   FU with me based on the results as above.     Signed, Minus Breeding, MD  10/29/2017 5:18 PM    [facility]

## 2017-10-27 NOTE — H&P (View-Only) (Signed)
Cardiology Office Note   Date:  10/29/2017   ID:  [name], [name] [birth year], MRN [id number]  PCP:  Mosie Lukes, MD  Cardiologist:   No primary care provider on file. Referring:  Mosie Lukes, MD  Chief Complaint  Patient presents with  . Shortness of Breath      History of Present Illness: [name] [name] is a 62 y.o. female who presents for evaluation of chest pain.  Dr. Bettina Gavia saw her in April.   She had an echo which was unremarkable.  It was suggested that she should have a coronary CTA.     She reports DOE after walking 30 feet on level ground.  She reports that her legs will "blow up" with swelling when she is walking.  She has had an echo with a very small pericardial effusion but no other significant findings.  She has seen Dr. Vaughan Browner and PFTs are pending.  She was unsatisfied that she had to wait for testing and wanted to have a second opinion about possible cardiac etiologies.  She has had no prior cardiac disease.  I do see that she had a cardiac cath in 2013 when she was seen by Dr. Mare Ferrari I the ED for evaluation of chest pain.  This was normal.  There was no CAD.     Past Medical History:  Diagnosis Date  . Abdominal pain 08/10/2014  . Allergic rhinitis 08/24/2010  . Anterior neck pain 08/10/2014  . Asthma   . Asthma with acute exacerbation 04/27/2011  . Atypical chest pain    normal coronaries and LV function by cath 10/2011  . BACK PAIN 03/01/2009   Qualifier: Diagnosis of  By: Wynona Luna   . Cervical cancer screening 10/27/2012   Menarche at 62, regular S/p partial hysterectomy at 26 for precancer changes persistent despite cryo. Both ovaries left in place. No HRT Menopause early 10s G1P1, s/p 1 svd s/p episiomtomy and tear with stitches MGM up to date, h/o calcium deposits repeat MGMs stable   . Chest pain 11/20/2011  . Chronic pain disorder    neck and shoulder  . CTS (carpal tunnel syndrome) 01/27/2013   right  . Diabetes mellitus type II   .  Diabetic gastroparesis ([place])   . Esophageal reflux 08/25/2013   [facility], Dr Barth Kirks  . Fatty liver    abnormal transaminases; negative work up  . FATTY LIVER DISEASE 04/27/2007   Qualifier: Diagnosis of  By: Danelle Earthly CMA, Darlene    . Focal muscle atrophy 09/26/2015  . Fundic gland polyps of stomach, benign   . GERD (gastroesophageal reflux disease)   . Hip pain, bilateral 07/19/2012  . HLD (hyperlipidemia)   . HTN (hypertension)   . Hyperlipidemia, mixed 05/04/2007   Qualifier: Diagnosis of  By: Wynona Luna crestor caused myalgias even at low dose Livalo caused myalgias Lipitor  Mother with severe reaction myalgias Simvastatin, Welchol   . Hypertension 08/24/2010  . Hypokalemia 05/25/2013   Improved stopping HCTZ. Was noted to have an elevated glucose when K was low. Recheck renal next week after starting Maxzide  . Hypomagnesemia 08/10/2014  . Hypothyroidism   . Laryngitis 08/25/2017  . Leg cramps 04/15/2016  . Nausea without vomiting 08/08/2014  . NONSPEC ELEVATION OF LEVELS OF TRANSAMINASE/LDH 05/01/2007   Qualifier: Diagnosis of  By: Garner Gavel    . Obesity 10/22/2016  . Osteoarthritis    chronic, right knee  . Other malaise  and fatigue 01/27/2013  . OVARIAN CYST 07/11/2009   Qualifier: Diagnosis of  By: Wynona Luna   . Pain of both thighs 08/08/2014  . Plantar fasciitis   . Preventative health care 04/15/2016  . Rosacea 10/22/2016    Past Surgical History:  Procedure Laterality Date  . ABDOMINAL HYSTERECTOMY  1982  . ABDOMINAL HYSTERECTOMY    . CHOLECYSTECTOMY    . COLONOSCOPY    . ESOPHAGOGASTRODUODENOSCOPY    . KNEE ARTHROSCOPY  1998, [id number]  . LEFT HEART CATHETERIZATION WITH CORONARY ANGIOGRAM N/A 11/21/2011   Procedure: LEFT HEART CATHETERIZATION WITH CORONARY ANGIOGRAM;  Surgeon: Josue Hector, MD;  Location: [facility] CATH LAB;  Service: Cardiovascular;  Laterality: N/A;  . TONSILLECTOMY  1975  . VESICOVAGINAL FISTULA CLOSURE W/ TAH  1995     Current  Outpatient Medications  Medication Sig Dispense Refill  . ACCU-CHEK FASTCLIX LANCETS MISC Check blood sugar once daily Dz:E11.9 100 each 3  . albuterol (PROAIR HFA) 108 (90 Base) MCG/ACT inhaler Inhale 2 puffs into the lungs every 6 (six) hours as needed for wheezing or shortness of breath. 18 g 5  . Alcohol Swabs (ALCOHOL PADS) 70 % PADS Use prior to checking blood sugars to clean skin    . aspirin 81 MG tablet Take by mouth daily.     . B-D ULTRAFINE III SHORT PEN 31G X 8 MM MISC INJECT ONCE DAILY 100 each 3  . Blood Glucose Monitoring Suppl (ACCU-CHEK NANO SMARTVIEW) w/Device KIT Check blood sugar once daily 1 kit 0  . Calcium Carbonate (CALTRATE 600 PO) Take 1 tablet by mouth daily.    Marland Kitchen diltiazem (CARDIZEM CD) 240 MG 24 hr capsule Take 1 capsule (240 mg total) by mouth daily. 90 capsule 3  . FARXIGA 10 MG TABS tablet TAKE 1 TABLET EVERY DAY 90 tablet 3  . fenofibrate 160 MG tablet TAKE 1 TABLET BY MOUTH EVERY DAY 90 tablet 0  . fluticasone (FLONASE) 50 MCG/ACT nasal spray Place 1 spray into both nostrils daily as needed for allergies.     . fluticasone furoate-vilanterol (BREO ELLIPTA) 200-25 MCG/INH AEPB Inhale 1 puff into the lungs daily. 60 each 6  . Fluticasone-Salmeterol (ADVAIR DISKUS) 250-50 MCG/DOSE AEPB INHALE 1 PUFF INTO LUNGS 2 TIMES DAILY (Patient taking differently: Inhale 1 puff into the lungs 2 (two) times daily as needed (wheezing). INHALE 1 PUFF INTO LUNGS 2 TIMES DAILY) 3 each 3  . Glucosamine-Chondroitin (OSTEO BI-FLEX REGULAR STRENGTH) 250-200 MG TABS Take 1 tablet by mouth 2 (two) times daily.     Marland Kitchen glucose blood (ACCU-CHEK SMARTVIEW) test strip Check blood sugars once daily 100 each 12  . Krill Oil 300 MG CAPS Take 1 capsule by mouth daily.    Marland Kitchen levothyroxine (SYNTHROID, LEVOTHROID) 88 MCG tablet TAKE 1 TABLET BY MOUTH EVERY DAY 90 tablet 1  . Magnesium 250 MG TABS Take 1 tablet by mouth daily.    . metFORMIN (GLUCOPHAGE-XR) 500 MG 24 hr tablet TAKE 2 TABLETS EVERY DAY  180 tablet 3  . Multiple Vitamin (MULTIVITAMIN) tablet Take 1 tablet by mouth daily.    . niacin (NIASPAN) 500 MG CR tablet 1 tab po qhs with lowfat snack and Aspirin 1/2 hour prior  Failed all statins and welchol (Patient taking differently: Take 500 mg by mouth at bedtime. 1 tab po qhs with lowfat snack and Aspirin 1/2 hour prior  Failed all statins and welchol) 30 tablet 5  . nitroGLYCERIN (NITROSTAT) 0.4 MG SL tablet Place  1 tablet (0.4 mg total) under the tongue every 5 (five) minutes as needed for chest pain. 50 tablet 3  . pantoprazole (PROTONIX) 40 MG tablet TAKE 1 TABLET BY MOUTH EVERY DAY 90 tablet 1  . pioglitazone (ACTOS) 30 MG tablet TAKE 1 TABLET (30 MG TOTAL) BY MOUTH DAILY. 90 tablet 3  . POTASSIUM CITRATE PO Take 1 tablet by mouth daily.    . repaglinide (PRANDIN) 0.5 MG tablet TAKE 1 TABLET (0.5 MG TOTAL) BY MOUTH DAILY WITH SUPPER. 90 tablet 0  . triamterene-hydrochlorothiazide (MAXZIDE-25) 37.5-25 MG tablet TAKE 1/2 TABLET BY MOUTH EVERY DAY 45 tablet 3  . VICTOZA 18 MG/3ML SOPN INJECT 1.8MG(0.3ML) DAILY FOR 90 DAYS 9 mL 3  . metoprolol tartrate (LOPRESSOR) 50 MG tablet Take 1 tablet (50 mg total) by mouth once for 1 dose. Take 1 tablet 1 hour before cardiac CTA. 1 tablet 0   No current facility-administered medications for this visit.     Allergies:   Amoxicillin; Epinephrine; Simvastatin; Statins; and Welchol [colesevelam hcl]     ROS:  Please see the history of present illness.   Otherwise, review of systems are positive for none.   All other systems are reviewed and negative.    PHYSICAL EXAM: VS:  BP 124/72 (BP Location: Left Arm)   Pulse 72   Ht _0  (1.676 m)   Wt 171 lb 12.8 oz (77.9 kg)   BMI 27.73 kg/m  , BMI Body mass index is 27.73 kg/m. GENERAL:  Well appearing NECK:  No jugular venous distention, waveform within normal limits, carotid upstroke brisk and symmetric, no bruits, no thyromegaly LUNGS:  Clear to auscultation bilaterally BACK:  No CVA  tenderness CHEST:  Unremarkable HEART:  PMI not displaced or sustained,S1 and S2 within normal limits, no S3, no S4, no clicks, no rubs, no murmurs ABD:  Flat, positive bowel sounds normal in frequency in pitch, no bruits, no rebound, no guarding, no midline pulsatile mass, no hepatomegaly, no splenomegaly EXT:  2 plus pulses throughout, no edema, no cyanosis no clubbing   EKG:  EKG is not ordered today.    Recent Labs: 08/25/2017: ALT 14; BUN 25; Creatinine, Ser 0.96; Magnesium 1.2; Potassium 3.9; Sodium 139; TSH 2.94 10/01/2017: Hemoglobin 11.4; Platelets 346.0    Lipid Panel    Component Value Date/Time   CHOL 220 (H) 08/25/2017 1620   TRIG 124.0 08/25/2017 1620   HDL 50.20 08/25/2017 1620   CHOLHDL 4 08/25/2017 1620   VLDL 24.8 08/25/2017 1620   LDLCALC 145 (H) 08/25/2017 1620   LDLDIRECT 151.0 06/01/2015 0812      Wt Readings from Last 3 Encounters:  10/29/17 171 lb 12.8 oz (77.9 kg)  10/07/17 171 lb 9.6 oz (77.8 kg)  10/01/17 171 lb (77.6 kg)      Other studies Reviewed: Additional studies/ records that were reviewed today include: I reviewed the recent cardiac note, echo result, EKG, past cath report and consult note. Review of the above records demonstrates:  Please see elsewhere in the note.     ASSESSMENT AND PLAN:  SOB:  I walked her around the office and she report that her legs were swelling and she was very very SOB.  However, her sats were 98 - 99% the entire time.  I will wait for the results of the CT and the PFTs before suggesting other testing or treatment.  At this time I have a low suspicion to suspect a cardiac etiology based on the echo and previous  cath.    HTN:  The blood pressure is at target. No change in medications is indicated. We will continue with therapeutic lifestyle changes (TLC).  HYPERLIPIDEMIA:  She does not tolerate statin and would not need treatment if she has no CAD on the study as above.    Current medicines are reviewed at  length with the patient today.  The patient does not have concerns regarding medicines.  The following changes have been made:  no change  Labs/ tests ordered today include: None No orders of the defined types were placed in this encounter.    Disposition:   FU with me based on the results as above.     Signed, Minus Breeding, MD  10/29/2017 5:18 PM    [facility]

## 2017-10-29 ENCOUNTER — Ambulatory Visit: Payer: BLUE CROSS/BLUE SHIELD | Admitting: Cardiology

## 2017-10-29 ENCOUNTER — Encounter: Payer: Self-pay | Admitting: Cardiology

## 2017-10-29 VITALS — BP 124/72 | HR 72 | Ht 66.0 in | Wt 171.8 lb

## 2017-10-29 DIAGNOSIS — E785 Hyperlipidemia, unspecified: Secondary | ICD-10-CM | POA: Diagnosis not present

## 2017-10-29 DIAGNOSIS — I10 Essential (primary) hypertension: Secondary | ICD-10-CM | POA: Diagnosis not present

## 2017-10-29 DIAGNOSIS — R0602 Shortness of breath: Secondary | ICD-10-CM | POA: Diagnosis not present

## 2017-10-29 NOTE — Patient Instructions (Addendum)
Medication Instructions:  Continue current medications  If you need a refill on your cardiac medications before your next appointment, please call your pharmacy.  Labwork: None Ordered   Testing/Procedures: None Ordered   Follow-Up: Your physician wants you to follow-up in: Base on Cardiac CT.     Thank you for choosing [facility] at [facility]!!

## 2017-10-31 ENCOUNTER — Telehealth: Payer: Self-pay | Admitting: *Deleted

## 2017-10-31 ENCOUNTER — Ambulatory Visit (HOSPITAL_COMMUNITY)
Admission: RE | Admit: 2017-10-31 | Discharge: 2017-10-31 | Disposition: A | Discharge status: Home / Self Care | Payer: BLUE CROSS/BLUE SHIELD | Source: Ambulatory Visit | Attending: Cardiology | Admitting: Cardiology

## 2017-10-31 DIAGNOSIS — R079 Chest pain, unspecified: Secondary | ICD-10-CM

## 2017-10-31 DIAGNOSIS — I2510 Atherosclerotic heart disease of native coronary artery without angina pectoris: Secondary | ICD-10-CM | POA: Insufficient documentation

## 2017-10-31 DIAGNOSIS — R9389 Abnormal findings on diagnostic imaging of other specified body structures: Secondary | ICD-10-CM

## 2017-10-31 DIAGNOSIS — R0602 Shortness of breath: Secondary | ICD-10-CM | POA: Diagnosis not present

## 2017-10-31 DIAGNOSIS — Z01818 Encounter for other preprocedural examination: Secondary | ICD-10-CM

## 2017-10-31 DIAGNOSIS — I10 Essential (primary) hypertension: Secondary | ICD-10-CM | POA: Diagnosis not present

## 2017-10-31 LAB — POCT I-STAT CREATININE: Creatinine, Ser: 1 mg/dL (ref 0.44–1.00)

## 2017-10-31 MED ORDER — METOPROLOL TARTRATE 5 MG/5ML IV SOLN
INTRAVENOUS | Status: AC
  Filled 2017-10-31: qty 5

## 2017-10-31 MED ORDER — IOPAMIDOL (ISOVUE-370) INJECTION 76%
INTRAVENOUS | Status: AC
  Filled 2017-10-31: qty 100

## 2017-10-31 MED ORDER — NITROGLYCERIN 0.4 MG SL SUBL
0.8000 mg | SUBLINGUAL_TABLET | Freq: Once | SUBLINGUAL | Status: AC
  Administered 2017-10-31: 0.8 mg via SUBLINGUAL
  Filled 2017-10-31: qty 25

## 2017-10-31 MED ORDER — METOPROLOL TARTRATE 5 MG/5ML IV SOLN
5.0000 mg | Freq: Once | INTRAVENOUS | Status: AC
  Administered 2017-10-31: 5 mg via INTRAVENOUS
  Filled 2017-10-31: qty 5

## 2017-10-31 MED ORDER — NITROGLYCERIN 0.4 MG SL SUBL
SUBLINGUAL_TABLET | SUBLINGUAL | Status: AC
  Filled 2017-10-31: qty 2

## 2017-10-31 MED ORDER — IOPAMIDOL (ISOVUE-370) INJECTION 76%
100.0000 mL | Freq: Once | INTRAVENOUS | Status: AC | PRN
  Administered 2017-10-31: 80 mL via INTRAVENOUS

## 2017-10-31 NOTE — Telephone Encounter (Signed)
-----   Message from Minus Breeding, MD sent at 10/31/2017  3:23 PM EDT ----- Discussed with the patient.  She will need a cardiac cath.  She would like to have this early next week.  We will arrange.  The patient understands that risks included but are not limited to stroke (1 in 1000), death (1 in 39), kidney failure [usually temporary] (1 in 500), bleeding (1 in 200), allergic reaction [possibly serious] (1 in 200).  The patient understands and agrees to proceed.   She will need labs.

## 2017-10-31 NOTE — Telephone Encounter (Signed)
@LOGO @  [place] [address] [place] [address] Dept: [phone] Loc: [place]  10/31/2017  You are scheduled for a Cardiac Catheterization on Tuesday, June 11 with Dr. Harrell Gave End.  1. Please arrive at the [facility] (Main Entrance A) at [facility]: [place], [place] [address] at 5:30 AM (two hours before your procedure to ensure your preparation). Free valet parking service is available.   Special note: Every effort is made to have your procedure done on time. Please understand that emergencies sometimes delay scheduled procedures.  2. Diet: Do not eat or drink anything after midnight prior to your procedure except sips of water to take medications.  3. Labs: Monday  4. Medication instructions in preparation for your procedure:   Current Outpatient Medications (Endocrine & Metabolic):  Marland Kitchen  FARXIGA 10 MG TABS tablet, TAKE 1 TABLET EVERY DAY .  levothyroxine (SYNTHROID, LEVOTHROID) 88 MCG tablet, TAKE 1 TABLET BY MOUTH EVERY DAY .  metFORMIN (GLUCOPHAGE-XR) 500 MG 24 hr tablet, TAKE 2 TABLETS EVERY DAY .  pioglitazone (ACTOS) 30 MG tablet, TAKE 1 TABLET (30 MG TOTAL) BY MOUTH DAILY. .  repaglinide (PRANDIN) 0.5 MG tablet, TAKE 1 TABLET (0.5 MG TOTAL) BY MOUTH DAILY WITH SUPPER. Marland Kitchen  VICTOZA 18 MG/3ML SOPN, INJECT 1.8MG(0.3ML) DAILY FOR 90 DAYS   Current Outpatient Medications (Cardiovascular):  .  diltiazem (CARDIZEM CD) 240 MG 24 hr capsule, Take 1 capsule (240 mg total) by mouth daily. .  fenofibrate 160 MG tablet, TAKE 1 TABLET BY MOUTH EVERY DAY .  metoprolol tartrate (LOPRESSOR) 50 MG tablet, Take 1 tablet (50 mg total) by mouth once for 1 dose. Take 1 tablet 1 hour before cardiac CTA. Marland Kitchen  niacin (NIASPAN) 500 MG CR tablet, 1 tab po qhs with lowfat snack and Aspirin 1/2 hour prior  Failed all statins and welchol (Patient taking  differently: Take 500 mg by mouth at bedtime. 1 tab po qhs with lowfat snack and Aspirin 1/2 hour prior  Failed all statins and welchol) .  nitroGLYCERIN (NITROSTAT) 0.4 MG SL tablet, Place 1 tablet (0.4 mg total) under the tongue every 5 (five) minutes as needed for chest pain. Marland Kitchen  triamterene-hydrochlorothiazide (MAXZIDE-25) 37.5-25 MG tablet, TAKE 1/2 TABLET BY MOUTH EVERY DAY   Facility-Administered Medications Ordered in Other Visits (Cardiovascular):  .  metoprolol tartrate (LOPRESSOR) 5 MG/5ML injection .  nitroGLYCERIN (NITROSTAT) 0.4 MG SL tablet .  nitroGLYCERIN (NITROSTAT) 0.4 MG SL tablet  Current Outpatient Medications (Respiratory):  .  albuterol (PROAIR HFA) 108 (90 Base) MCG/ACT inhaler, Inhale 2 puffs into the lungs every 6 (six) hours as needed for wheezing or shortness of breath. .  fluticasone (FLONASE) 50 MCG/ACT nasal spray, Place 1 spray into both nostrils daily as needed for allergies.  .  fluticasone furoate-vilanterol (BREO ELLIPTA) 200-25 MCG/INH AEPB, Inhale 1 puff into the lungs daily. .  Fluticasone-Salmeterol (ADVAIR DISKUS) 250-50 MCG/DOSE AEPB, INHALE 1 PUFF INTO LUNGS 2 TIMES DAILY (Patient taking differently: Inhale 1 puff into the lungs 2 (two) times daily as needed (wheezing). INHALE 1 PUFF INTO LUNGS 2 TIMES DAILY)   Current Outpatient Medications (Analgesics):  .  aspirin 81 MG tablet, Take by mouth daily.      Current Outpatient Medications (Other):  Marland Kitchen  ACCU-CHEK FASTCLIX LANCETS MISC, Check blood sugar once daily Dz:E11.9 .  Alcohol Swabs (ALCOHOL PADS) 70 % PADS, Use prior to checking blood sugars  to clean skin .  B-D ULTRAFINE III SHORT PEN 31G X 8 MM MISC, INJECT ONCE DAILY .  Blood Glucose Monitoring Suppl (ACCU-CHEK NANO SMARTVIEW) w/Device KIT, Check blood sugar once daily .  Calcium Carbonate (CALTRATE 600 PO), Take 1 tablet by mouth daily. .  Glucosamine-Chondroitin (OSTEO BI-FLEX REGULAR STRENGTH) 250-200 MG TABS, Take 1 tablet by mouth 2  (two) times daily.  Marland Kitchen  glucose blood (ACCU-CHEK SMARTVIEW) test strip, Check blood sugars once daily .  Krill Oil 300 MG CAPS, Take 1 capsule by mouth daily. .  Magnesium 250 MG TABS, Take 1 tablet by mouth daily. .  Multiple Vitamin (MULTIVITAMIN) tablet, Take 1 tablet by mouth daily. .  pantoprazole (PROTONIX) 40 MG tablet, TAKE 1 TABLET BY MOUTH EVERY DAY .  POTASSIUM CITRATE PO, Take 1 tablet by mouth daily.   Facility-Administered Medications Ordered in Other Visits (Other):  Marland Kitchen  iopamidol (ISOVUE-370) 76 % injection No current facility-administered medications for this visit.  *For reference purposes while preparing patient instructions.   Delete this med list prior to printing instructions for patient.*  HOLD: Metformin a day prior to your procedure and 2 day after.   On the morning of your procedure, take your Aspirin and Brilinta/Ticagrelor and any morning medicines you may use sips of water.  5. Plan for one night stay--bring personal belongings. 6. Bring a current list of your medications and current insurance cards. 7. You MUST have a responsible person to drive you home. 8. Someone MUST be with you the first 24 hours after you arrive home or your discharge will be delayed. 9. Please wear clothes that are easy to get on and off and wear slip-on shoes.  Thank you for allowing [place] to care for you!   -- [place] [facility]

## 2017-11-03 ENCOUNTER — Telehealth: Payer: Self-pay | Admitting: *Deleted

## 2017-11-03 LAB — CBC
HEMATOCRIT: 33.8 % — AB (ref 34.0–46.6)
Hemoglobin: 11.2 g/dL (ref 11.1–15.9)
MCH: 24.8 pg — ABNORMAL LOW (ref 26.6–33.0)
MCHC: 33.1 g/dL (ref 31.5–35.7)
MCV: 75 fL — ABNORMAL LOW (ref 79–97)
Platelets: 359 10*3/uL (ref 150–450)
RBC: 4.51 x10E6/uL (ref 3.77–5.28)
RDW: 14.4 % (ref 12.3–15.4)
WBC: 5.9 10*3/uL (ref 3.4–10.8)

## 2017-11-03 LAB — BASIC METABOLIC PANEL
BUN/Creatinine Ratio: 15 (ref 12–28)
BUN: 18 mg/dL (ref 8–27)
CO2: 23 mmol/L (ref 20–29)
CREATININE: 1.17 mg/dL — AB (ref 0.57–1.00)
Calcium: 10 mg/dL (ref 8.7–10.3)
Chloride: 102 mmol/L (ref 96–106)
GFR calc Af Amer: 57 mL/min/{1.73_m2} — ABNORMAL LOW (ref >59)
GFR calc non Af Amer: 50 mL/min/{1.73_m2} — ABNORMAL LOW (ref >59)
GLUCOSE: 139 mg/dL — AB (ref 65–99)
Potassium: 4.5 mmol/L (ref 3.5–5.2)
Sodium: 140 mmol/L (ref 134–144)

## 2017-11-03 LAB — TSH: TSH: 4.96 u[IU]/mL — AB (ref 0.450–4.500)

## 2017-11-03 NOTE — Telephone Encounter (Deleted)
Pt contacted pre-catheterization scheduled at [facility] for: Tuesday June 11,2019 7:30 AM Verified arrival time and place: [place] Entrance A at: 5:30 AM  No solid food after midnight prior to cath, clear liquids until 5 AM day of procedure.  Hold: Metformin 11/04/17 and 48 hours post cath Victoza PM before cath Pioglitazone PM before cath Repaglinide PM before cath Farxiga PM before cath Triamterene/HCT AM of cath  AM meds can be  taken pre-cath with sip of water including                                                                                                                                         Confirmed patient has responsible person to drive home post procedure and observe patient for 24 hours:

## 2017-11-03 NOTE — Telephone Encounter (Deleted)
Pt contacted pre-catheterization scheduled at [facility] for: Tuesday June 11,2019 7:30 AM Verified arrival time and place: [place] Entrance A at: 5:30 AM  No solid food after midnight prior to cath, clear liquids until 5 AM day of procedure.  Hold: Metformin 11/04/17 and 48 hours post cath Victoza PM before cath Pioglitazone PM before cath Repaglinide PM before cath Farxiga PM before cath Triamterene/HCT AM of cath  AM meds can be  taken pre-cath with sip of water including: ASA 81 mg                                                                                                                                 Confirmed patient has responsible person to drive home post procedure and observe patient for 24 hours:

## 2017-11-03 NOTE — Telephone Encounter (Signed)
Pt contacted pre-catheterization scheduled at [facility] for: Tuesday June 11,2019 7:30 AM Verified arrival time and place: [place] Entrance A at: 5:30 AM  No solid food after midnight prior to cath, clear liquids until 5 AM day of procedure.  Hold: Metformin 11/04/17 and 48 hours post procedure Victoza PM before procedure Pioglitazone PM before procedure Repaglinide PM before procedure Farxiga PM before procedure Triamterene/HCT AM of procedure  AM meds can be  taken pre-cath with sip of water including: ASA 81 mg   Confirmed patient has responsible person to drive home post procedure and observe patient for 24 hours:

## 2017-11-04 ENCOUNTER — Other Ambulatory Visit: Payer: Self-pay

## 2017-11-04 ENCOUNTER — Encounter (HOSPITAL_COMMUNITY): Payer: Self-pay | Admitting: Internal Medicine

## 2017-11-04 ENCOUNTER — Ambulatory Visit (HOSPITAL_COMMUNITY)
Admission: RE | Disposition: A | Discharge status: Home / Self Care | Payer: Self-pay | Source: Ambulatory Visit | Attending: Internal Medicine

## 2017-11-04 ENCOUNTER — Ambulatory Visit (HOSPITAL_COMMUNITY)
Admission: RE | Admit: 2017-11-04 | Discharge: 2017-11-05 | Disposition: A | Discharge status: Home / Self Care | Payer: BLUE CROSS/BLUE SHIELD | Source: Ambulatory Visit | Attending: Internal Medicine | Admitting: Internal Medicine

## 2017-11-04 DIAGNOSIS — K760 Fatty (change of) liver, not elsewhere classified: Secondary | ICD-10-CM | POA: Insufficient documentation

## 2017-11-04 DIAGNOSIS — J45909 Unspecified asthma, uncomplicated: Secondary | ICD-10-CM | POA: Insufficient documentation

## 2017-11-04 DIAGNOSIS — L719 Rosacea, unspecified: Secondary | ICD-10-CM | POA: Insufficient documentation

## 2017-11-04 DIAGNOSIS — K3184 Gastroparesis: Secondary | ICD-10-CM | POA: Diagnosis not present

## 2017-11-04 DIAGNOSIS — I25110 Atherosclerotic heart disease of native coronary artery with unstable angina pectoris: Secondary | ICD-10-CM

## 2017-11-04 DIAGNOSIS — Z955 Presence of coronary angioplasty implant and graft: Secondary | ICD-10-CM

## 2017-11-04 DIAGNOSIS — I10 Essential (primary) hypertension: Secondary | ICD-10-CM | POA: Diagnosis not present

## 2017-11-04 DIAGNOSIS — Z6827 Body mass index (BMI) 27.0-27.9, adult: Secondary | ICD-10-CM | POA: Diagnosis not present

## 2017-11-04 DIAGNOSIS — E1143 Type 2 diabetes mellitus with diabetic autonomic (poly)neuropathy: Secondary | ICD-10-CM | POA: Insufficient documentation

## 2017-11-04 DIAGNOSIS — M1711 Unilateral primary osteoarthritis, right knee: Secondary | ICD-10-CM | POA: Insufficient documentation

## 2017-11-04 DIAGNOSIS — Z7982 Long term (current) use of aspirin: Secondary | ICD-10-CM | POA: Insufficient documentation

## 2017-11-04 DIAGNOSIS — I200 Unstable angina: Secondary | ICD-10-CM | POA: Diagnosis present

## 2017-11-04 DIAGNOSIS — K219 Gastro-esophageal reflux disease without esophagitis: Secondary | ICD-10-CM | POA: Diagnosis not present

## 2017-11-04 DIAGNOSIS — Z880 Allergy status to penicillin: Secondary | ICD-10-CM | POA: Insufficient documentation

## 2017-11-04 DIAGNOSIS — I2584 Coronary atherosclerosis due to calcified coronary lesion: Secondary | ICD-10-CM | POA: Diagnosis not present

## 2017-11-04 DIAGNOSIS — Z7984 Long term (current) use of oral hypoglycemic drugs: Secondary | ICD-10-CM | POA: Insufficient documentation

## 2017-11-04 DIAGNOSIS — Z7951 Long term (current) use of inhaled steroids: Secondary | ICD-10-CM | POA: Insufficient documentation

## 2017-11-04 DIAGNOSIS — R931 Abnormal findings on diagnostic imaging of heart and coronary circulation: Secondary | ICD-10-CM | POA: Diagnosis present

## 2017-11-04 DIAGNOSIS — E039 Hypothyroidism, unspecified: Secondary | ICD-10-CM | POA: Insufficient documentation

## 2017-11-04 DIAGNOSIS — E782 Mixed hyperlipidemia: Secondary | ICD-10-CM | POA: Diagnosis not present

## 2017-11-04 DIAGNOSIS — E669 Obesity, unspecified: Secondary | ICD-10-CM | POA: Insufficient documentation

## 2017-11-04 DIAGNOSIS — G8929 Other chronic pain: Secondary | ICD-10-CM | POA: Insufficient documentation

## 2017-11-04 HISTORY — DX: Pneumonia, unspecified organism: J18.9

## 2017-11-04 HISTORY — PX: CORONARY PRESSURE/FFR STUDY: CATH118243

## 2017-11-04 HISTORY — PX: LEFT HEART CATH AND CORONARY ANGIOGRAPHY: CATH118249

## 2017-11-04 HISTORY — DX: Unstable angina: I20.0

## 2017-11-04 HISTORY — PX: CORONARY ULTRASOUND/IVUS: CATH118244

## 2017-11-04 HISTORY — PX: CORONARY STENT INTERVENTION: CATH118234

## 2017-11-04 LAB — POCT ACTIVATED CLOTTING TIME
ACTIVATED CLOTTING TIME: 224 s
ACTIVATED CLOTTING TIME: 224 s
ACTIVATED CLOTTING TIME: 246 s
ACTIVATED CLOTTING TIME: 274 s
ACTIVATED CLOTTING TIME: 279 s
Activated Clotting Time: 241 seconds

## 2017-11-04 LAB — GLUCOSE, CAPILLARY
GLUCOSE-CAPILLARY: 138 mg/dL — AB (ref 65–99)
GLUCOSE-CAPILLARY: 146 mg/dL — AB (ref 65–99)
GLUCOSE-CAPILLARY: 137 mg/dL — AB (ref 65–99)
Glucose-Capillary: 151 mg/dL — ABNORMAL HIGH (ref 65–99)

## 2017-11-04 LAB — CBC
HEMATOCRIT: 34.8 % — AB (ref 36.0–46.0)
HEMOGLOBIN: 10.7 g/dL — AB (ref 12.0–15.0)
MCH: 24.9 pg — ABNORMAL LOW (ref 26.0–34.0)
MCHC: 30.7 g/dL (ref 30.0–36.0)
MCV: 80.9 fL (ref 78.0–100.0)
Platelets: 293 10*3/uL (ref 150–400)
RBC: 4.3 MIL/uL (ref 3.87–5.11)
RDW: 14.5 % (ref 11.5–15.5)
WBC: 8.6 10*3/uL (ref 4.0–10.5)

## 2017-11-04 LAB — CREATININE, SERUM: Creatinine, Ser: 0.98 mg/dL (ref 0.44–1.00)

## 2017-11-04 SURGERY — LEFT HEART CATH AND CORONARY ANGIOGRAPHY
Anesthesia: LOCAL

## 2017-11-04 MED ORDER — MAGNESIUM OXIDE 400 (241.3 MG) MG PO TABS
400.0000 mg | ORAL_TABLET | Freq: Every day | ORAL | Status: DC
  Administered 2017-11-04 – 2017-11-05 (×2): 400 mg via ORAL
  Filled 2017-11-04 (×3): qty 1

## 2017-11-04 MED ORDER — TICAGRELOR 90 MG PO TABS
90.0000 mg | ORAL_TABLET | Freq: Two times a day (BID) | ORAL | Status: DC
  Administered 2017-11-04 – 2017-11-05 (×2): 90 mg via ORAL
  Filled 2017-11-04 (×2): qty 1

## 2017-11-04 MED ORDER — ASPIRIN 81 MG PO CHEW
81.0000 mg | CHEWABLE_TABLET | Freq: Every day | ORAL | Status: DC
  Administered 2017-11-05: 81 mg via ORAL
  Filled 2017-11-04: qty 1

## 2017-11-04 MED ORDER — ONDANSETRON HCL 4 MG/2ML IJ SOLN
4.0000 mg | Freq: Four times a day (QID) | INTRAMUSCULAR | Status: DC | PRN
  Filled 2017-11-04: qty 2

## 2017-11-04 MED ORDER — NITROGLYCERIN 0.4 MG SL SUBL: 0.4000 mg | SUBLINGUAL_TABLET | SUBLINGUAL | Status: DC | PRN

## 2017-11-04 MED ORDER — ADENOSINE (DIAGNOSTIC) 140MCG/KG/MIN
INTRAVENOUS | Status: DC | PRN
  Administered 2017-11-04: 140 ug/kg/min via INTRAVENOUS

## 2017-11-04 MED ORDER — NITROGLYCERIN 1 MG/10 ML FOR IR/CATH LAB
INTRA_ARTERIAL | Status: DC | PRN
  Administered 2017-11-04 (×2): 200 ug via INTRACORONARY

## 2017-11-04 MED ORDER — NITROGLYCERIN 1 MG/10 ML FOR IR/CATH LAB
INTRA_ARTERIAL | Status: AC
  Filled 2017-11-04: qty 10

## 2017-11-04 MED ORDER — ANGIOPLASTY BOOK
Freq: Once | Status: DC
  Filled 2017-11-04: qty 1

## 2017-11-04 MED ORDER — SODIUM CHLORIDE 0.9% FLUSH: 3.0000 mL | INTRAVENOUS | Status: DC | PRN

## 2017-11-04 MED ORDER — HEPARIN SODIUM (PORCINE) 1000 UNIT/ML IJ SOLN
INTRAMUSCULAR | Status: AC
  Filled 2017-11-04: qty 1

## 2017-11-04 MED ORDER — HEPARIN SODIUM (PORCINE) 1000 UNIT/ML IJ SOLN
INTRAMUSCULAR | Status: DC | PRN
  Administered 2017-11-04: 4000 [IU] via INTRAVENOUS
  Administered 2017-11-04: 3000 [IU] via INTRAVENOUS
  Administered 2017-11-04: 4000 [IU] via INTRAVENOUS
  Administered 2017-11-04 (×3): 2000 [IU] via INTRAVENOUS

## 2017-11-04 MED ORDER — ENOXAPARIN SODIUM 40 MG/0.4ML ~~LOC~~ SOLN
40.0000 mg | SUBCUTANEOUS | Status: DC
  Administered 2017-11-05: 40 mg via SUBCUTANEOUS
  Filled 2017-11-04: qty 0.4

## 2017-11-04 MED ORDER — TICAGRELOR 90 MG PO TABS
ORAL_TABLET | ORAL | Status: DC | PRN
  Administered 2017-11-04: 180 mg via ORAL

## 2017-11-04 MED ORDER — ADENOSINE 12 MG/4ML IV SOLN
INTRAVENOUS | Status: AC
  Filled 2017-11-04: qty 12

## 2017-11-04 MED ORDER — SODIUM CHLORIDE 0.9% FLUSH: 3.0000 mL | Freq: Two times a day (BID) | INTRAVENOUS | Status: DC

## 2017-11-04 MED ORDER — MIDAZOLAM HCL 2 MG/2ML IJ SOLN
INTRAMUSCULAR | Status: AC
  Filled 2017-11-04: qty 2

## 2017-11-04 MED ORDER — MORPHINE SULFATE (PF) 2 MG/ML IV SOLN
INTRAVENOUS | Status: DC | PRN
  Administered 2017-11-04: 2 mg via INTRAVENOUS

## 2017-11-04 MED ORDER — LIDOCAINE HCL (PF) 1 % IJ SOLN
INTRAMUSCULAR | Status: AC
  Filled 2017-11-04: qty 30

## 2017-11-04 MED ORDER — TICAGRELOR 90 MG PO TABS
ORAL_TABLET | ORAL | Status: AC
  Filled 2017-11-04: qty 2

## 2017-11-04 MED ORDER — FENTANYL CITRATE (PF) 100 MCG/2ML IJ SOLN
INTRAMUSCULAR | Status: DC | PRN
  Administered 2017-11-04 (×4): 25 ug via INTRAVENOUS

## 2017-11-04 MED ORDER — SODIUM CHLORIDE 0.9 % WEIGHT BASED INFUSION
3.0000 mL/kg/h | INTRAVENOUS | Status: DC
  Administered 2017-11-04: 3 mL/kg/h via INTRAVENOUS

## 2017-11-04 MED ORDER — VERAPAMIL HCL 2.5 MG/ML IV SOLN
INTRAVENOUS | Status: DC | PRN
  Administered 2017-11-04: 10 mL via INTRA_ARTERIAL

## 2017-11-04 MED ORDER — HYDRALAZINE HCL 20 MG/ML IJ SOLN: 5.0000 mg | INTRAMUSCULAR | Status: AC | PRN

## 2017-11-04 MED ORDER — VERAPAMIL HCL 2.5 MG/ML IV SOLN
INTRAVENOUS | Status: AC
  Filled 2017-11-04: qty 2

## 2017-11-04 MED ORDER — INSULIN ASPART 100 UNIT/ML ~~LOC~~ SOLN: 0.0000 [IU] | Freq: Every day | SUBCUTANEOUS | Status: DC

## 2017-11-04 MED ORDER — HEPARIN (PORCINE) IN NACL 1000-0.9 UT/500ML-% IV SOLN
INTRAVENOUS | Status: AC
  Filled 2017-11-04: qty 1000

## 2017-11-04 MED ORDER — SODIUM CHLORIDE 0.9 % IV SOLN
INTRAVENOUS | Status: AC
  Administered 2017-11-04: 12:00:00 via INTRAVENOUS

## 2017-11-04 MED ORDER — ALBUTEROL SULFATE (2.5 MG/3ML) 0.083% IN NEBU: 3.0000 mL | INHALATION_SOLUTION | Freq: Four times a day (QID) | RESPIRATORY_TRACT | Status: DC | PRN

## 2017-11-04 MED ORDER — SODIUM CHLORIDE 0.9 % IV SOLN: 250.0000 mL | INTRAVENOUS | Status: DC | PRN

## 2017-11-04 MED ORDER — ONDANSETRON HCL 4 MG/2ML IJ SOLN
INTRAMUSCULAR | Status: AC
  Filled 2017-11-04: qty 2

## 2017-11-04 MED ORDER — FENOFIBRATE 160 MG PO TABS
160.0000 mg | ORAL_TABLET | Freq: Every day | ORAL | Status: DC
  Administered 2017-11-04 – 2017-11-05 (×2): 160 mg via ORAL
  Filled 2017-11-04 (×2): qty 1

## 2017-11-04 MED ORDER — FENTANYL CITRATE (PF) 100 MCG/2ML IJ SOLN
INTRAMUSCULAR | Status: AC
  Filled 2017-11-04: qty 2

## 2017-11-04 MED ORDER — NITROGLYCERIN IN D5W 200-5 MCG/ML-% IV SOLN
INTRAVENOUS | Status: AC | PRN
  Administered 2017-11-04: 5 ug/min via INTRAVENOUS

## 2017-11-04 MED ORDER — MORPHINE SULFATE (PF) 10 MG/ML IV SOLN
INTRAVENOUS | Status: AC
  Filled 2017-11-04: qty 1

## 2017-11-04 MED ORDER — ADENOSINE 12 MG/4ML IV SOLN
INTRAVENOUS | Status: AC
  Filled 2017-11-04: qty 4

## 2017-11-04 MED ORDER — PANTOPRAZOLE SODIUM 40 MG PO TBEC
40.0000 mg | DELAYED_RELEASE_TABLET | Freq: Every day | ORAL | Status: DC
Start: 2017-11-04 — End: 2017-11-05
  Administered 2017-11-04 – 2017-11-05 (×2): 40 mg via ORAL
  Filled 2017-11-04 (×2): qty 1

## 2017-11-04 MED ORDER — HEPARIN (PORCINE) IN NACL 2-0.9 UNITS/ML
INTRAMUSCULAR | Status: AC | PRN
  Administered 2017-11-04: 500 mL via INTRA_ARTERIAL
  Administered 2017-11-04: 500 mL

## 2017-11-04 MED ORDER — LEVOTHYROXINE SODIUM 88 MCG PO TABS
88.0000 ug | ORAL_TABLET | Freq: Every day | ORAL | Status: DC
  Administered 2017-11-05: 88 ug via ORAL
  Filled 2017-11-04: qty 1

## 2017-11-04 MED ORDER — SODIUM CHLORIDE 0.9 % WEIGHT BASED INFUSION: 1.0000 mL/kg/h | INTRAVENOUS | Status: DC

## 2017-11-04 MED ORDER — ACETAMINOPHEN 325 MG PO TABS: 650.0000 mg | ORAL_TABLET | ORAL | Status: DC | PRN

## 2017-11-04 MED ORDER — METOPROLOL TARTRATE 25 MG PO TABS
25.0000 mg | ORAL_TABLET | Freq: Two times a day (BID) | ORAL | Status: DC
  Administered 2017-11-04 – 2017-11-05 (×3): 25 mg via ORAL
  Filled 2017-11-04 (×3): qty 1

## 2017-11-04 MED ORDER — LIDOCAINE HCL (PF) 1 % IJ SOLN
INTRAMUSCULAR | Status: DC | PRN
  Administered 2017-11-04: 2 mL

## 2017-11-04 MED ORDER — REPAGLINIDE 0.5 MG PO TABS
0.5000 mg | ORAL_TABLET | Freq: Every day | ORAL | Status: DC
  Administered 2017-11-04: 22:00:00 0.5 mg via ORAL
  Filled 2017-11-04: qty 0.5
  Filled 2017-11-04: qty 1
  Filled 2017-11-04: qty 0.5
  Filled 2017-11-04: qty 1
  Filled 2017-11-04: qty 0.5

## 2017-11-04 MED ORDER — INSULIN ASPART 100 UNIT/ML ~~LOC~~ SOLN
0.0000 [IU] | Freq: Three times a day (TID) | SUBCUTANEOUS | Status: DC
  Administered 2017-11-04 – 2017-11-05 (×2): 2 [IU] via SUBCUTANEOUS

## 2017-11-04 MED ORDER — ASPIRIN 81 MG PO CHEW: 81.0000 mg | CHEWABLE_TABLET | ORAL | Status: DC

## 2017-11-04 MED ORDER — DILTIAZEM HCL ER COATED BEADS 240 MG PO CP24
240.0000 mg | ORAL_CAPSULE | Freq: Every day | ORAL | Status: DC
  Administered 2017-11-04 – 2017-11-05 (×2): 240 mg via ORAL
  Filled 2017-11-04 (×2): qty 1

## 2017-11-04 MED ORDER — MIDAZOLAM HCL 2 MG/2ML IJ SOLN
INTRAMUSCULAR | Status: DC | PRN
  Administered 2017-11-04: 1 mg via INTRAVENOUS
  Administered 2017-11-04 (×2): 0.5 mg via INTRAVENOUS

## 2017-11-04 MED ORDER — NITROGLYCERIN IN D5W 200-5 MCG/ML-% IV SOLN
INTRAVENOUS | Status: AC
  Filled 2017-11-04: qty 250

## 2017-11-04 MED ORDER — ONDANSETRON HCL 4 MG/2ML IJ SOLN
INTRAMUSCULAR | Status: DC | PRN
  Administered 2017-11-04: 4 mg via INTRAVENOUS

## 2017-11-04 MED ORDER — LABETALOL HCL 5 MG/ML IV SOLN: 10.0000 mg | INTRAVENOUS | Status: AC | PRN

## 2017-11-04 MED ORDER — IOHEXOL 350 MG/ML SOLN
INTRAVENOUS | Status: DC | PRN
  Administered 2017-11-04: 195 mL via INTRAVENOUS

## 2017-11-04 MED ORDER — SODIUM CHLORIDE 0.9% FLUSH
3.0000 mL | Freq: Two times a day (BID) | INTRAVENOUS | Status: DC
  Administered 2017-11-04 – 2017-11-05 (×2): 3 mL via INTRAVENOUS

## 2017-11-04 SURGICAL SUPPLY — 27 items
BAG MDU5 PLUS (MISCELLANEOUS) ×1 IMPLANT
BAG US STRL MDU5 + (MISCELLANEOUS) ×1
BALLN SAPPHIRE 3.0X12 (BALLOONS) ×2
BALLN SAPPHIRE ~~LOC~~ 3.0X8 (BALLOONS) ×1 IMPLANT
BALLN SAPPHIRE ~~LOC~~ 3.5X12 (BALLOONS) ×1 IMPLANT
BALLOON SAPPHIRE 3.0X12 (BALLOONS) IMPLANT
CATH 5FR JL3.5 JR4 ANG PIG MP (CATHETERS) ×1 IMPLANT
CATH LAUNCHER 6FR EBU3.5 (CATHETERS) ×1 IMPLANT
CATH MICROCATH NAVVUS (MICROCATHETER) IMPLANT
CATH OPTICROSS 40MHZ (CATHETERS) ×1 IMPLANT
DEVICE RAD COMP TR BAND LRG (VASCULAR PRODUCTS) ×1 IMPLANT
GUIDEWIRE INQWIRE 1.5J.035X260 (WIRE) IMPLANT
INQWIRE 1.5J .035X260CM (WIRE) ×2
KIT ENCORE 26 ADVANTAGE (KITS) ×1 IMPLANT
KIT HEART LEFT (KITS) ×2 IMPLANT
MICROCATHETER NAVVUS (MICROCATHETER) ×2
NDL PERC 21GX4CM (NEEDLE) IMPLANT
NEEDLE PERC 21GX4CM (NEEDLE) ×2 IMPLANT
PACK CARDIAC CATHETERIZATION (CUSTOM PROCEDURE TRAY) ×2 IMPLANT
SHEATH RAIN RADIAL 21G 6FR (SHEATH) ×1 IMPLANT
SLED PULL BACK IVUS (MISCELLANEOUS) ×1 IMPLANT
STENT SIERRA 2.75 X 08 MM (Permanent Stent) ×1 IMPLANT
STENT SIERRA 3.00 X 18 MM (Permanent Stent) ×1 IMPLANT
TRANSDUCER W/STOPCOCK (MISCELLANEOUS) ×2 IMPLANT
TUBING CIL FLEX 10 FLL-RA (TUBING) ×2 IMPLANT
WIRE COUGAR XT STRL 190CM (WIRE) ×1 IMPLANT
WIRE SAMURAI STR TIP 190CM (WIRE) ×1 IMPLANT

## 2017-11-04 NOTE — Care Management Note (Addendum)
Case Management Note  Patient Details  Name: [name] [name] MRN: [id number] Date of Birth: 1955/09/10  Subjective/Objective:   Shortness of breath, HTN, Hyperlipidemia                 Action/Plan: Spoke to pt and husband at bedside. Provided pt with Brilinta $5 copay card.    Co-pay for Brillinta 90 mg.BID 60 tabs $.24.99  NO PA required.,[facility].   Expected Discharge Date:                  Expected Discharge Plan:  Home/Self Care  In-House Referral:  NA  Discharge planning Services  CM Consult, Medication Assistance  Post Acute Care Choice:  NA Choice offered to:  NA  DME Arranged:  N/A DME Agency:  NA  HH Arranged:  NA HH Agency:  NA  Status of Service:  Completed, signed off  If discussed at [place] of Stay Meetings, dates discussed:    Additional Comments:  Erenest Rasher, RN 11/04/2017, 5:02 PM

## 2017-11-04 NOTE — Interval H&P Note (Signed)
History and Physical Interval Note:  11/04/2017 6:52 AM  [name]  has presented today for cardiac catheterization, with the diagnosis of accelerating angina and abnormal cardiac cta. The various methods of treatment have been discussed with the patient and family. After consideration of risks, benefits and other options for treatment, the patient has consented to  Procedure(s): LEFT HEART CATH AND CORONARY ANGIOGRAPHY (N/A) as a surgical intervention .  The patient's history has been reviewed, patient examined, no change in status, stable for surgery.  I have reviewed the patient's chart and labs.  Questions were answered to the patient's satisfaction.    Cath Lab Visit (complete for each Cath Lab visit)  Clinical Evaluation Leading to the Procedure:   ACS: No.  Non-ACS:    Anginal Classification: CCS III  Anti-ischemic medical therapy: Maximal Therapy (2 or more classes of medications)  Non-Invasive Test Results: High-risk stress test findings: cardiac mortality >3%/year  Prior CABG: No previous CABG  Trenell Concannon

## 2017-11-04 NOTE — Progress Notes (Signed)
TR BAND REMOVAL  LOCATION:  right radial  DEFLATED PER PROTOCOL:  Yes.    TIME BAND OFF / DRESSING APPLIED:   1615    SITE UPON ARRIVAL:   Level 1  SITE AFTER BAND REMOVAL:  Level 1  CIRCULATION SENSATION AND MOVEMENT:  Within Normal Limits  Yes.    COMMENTS:

## 2017-11-05 DIAGNOSIS — I2584 Coronary atherosclerosis due to calcified coronary lesion: Secondary | ICD-10-CM

## 2017-11-05 DIAGNOSIS — I2510 Atherosclerotic heart disease of native coronary artery without angina pectoris: Secondary | ICD-10-CM | POA: Diagnosis not present

## 2017-11-05 DIAGNOSIS — G8929 Other chronic pain: Secondary | ICD-10-CM | POA: Diagnosis not present

## 2017-11-05 DIAGNOSIS — I200 Unstable angina: Secondary | ICD-10-CM | POA: Diagnosis not present

## 2017-11-05 DIAGNOSIS — I10 Essential (primary) hypertension: Secondary | ICD-10-CM | POA: Diagnosis not present

## 2017-11-05 DIAGNOSIS — I25110 Atherosclerotic heart disease of native coronary artery with unstable angina pectoris: Secondary | ICD-10-CM | POA: Diagnosis not present

## 2017-11-05 DIAGNOSIS — E7800 Pure hypercholesterolemia, unspecified: Secondary | ICD-10-CM | POA: Diagnosis not present

## 2017-11-05 LAB — CBC
HEMATOCRIT: 31 % — AB (ref 36.0–46.0)
Hemoglobin: 9.4 g/dL — ABNORMAL LOW (ref 12.0–15.0)
MCH: 24.5 pg — ABNORMAL LOW (ref 26.0–34.0)
MCHC: 30.3 g/dL (ref 30.0–36.0)
MCV: 80.7 fL (ref 78.0–100.0)
PLATELETS: 291 10*3/uL (ref 150–400)
RBC: 3.84 MIL/uL — ABNORMAL LOW (ref 3.87–5.11)
RDW: 14.5 % (ref 11.5–15.5)
WBC: 8.9 10*3/uL (ref 4.0–10.5)

## 2017-11-05 LAB — GLUCOSE, CAPILLARY: GLUCOSE-CAPILLARY: 135 mg/dL — AB (ref 65–99)

## 2017-11-05 LAB — BASIC METABOLIC PANEL
Anion gap: 9 (ref 5–15)
BUN: 13 mg/dL (ref 6–20)
CALCIUM: 8.7 mg/dL — AB (ref 8.9–10.3)
CO2: 23 mmol/L (ref 22–32)
Chloride: 106 mmol/L (ref 101–111)
Creatinine, Ser: 1.09 mg/dL — ABNORMAL HIGH (ref 0.44–1.00)
GFR calc Af Amer: >60 mL/min (ref >60)
GFR, EST NON AFRICAN AMERICAN: 53 mL/min — AB (ref >60)
GLUCOSE: 152 mg/dL — AB (ref 65–99)
Potassium: 3.9 mmol/L (ref 3.5–5.1)
SODIUM: 138 mmol/L (ref 135–145)

## 2017-11-05 MED ORDER — METOPROLOL TARTRATE 25 MG PO TABS: 25.0000 mg | ORAL_TABLET | Freq: Two times a day (BID) | ORAL | 3 refills | Status: DC

## 2017-11-05 MED ORDER — TICAGRELOR 90 MG PO TABS: 90.0000 mg | ORAL_TABLET | Freq: Two times a day (BID) | ORAL | 0 refills | Status: DC

## 2017-11-05 MED ORDER — TICAGRELOR 90 MG PO TABS: 90.0000 mg | ORAL_TABLET | Freq: Two times a day (BID) | ORAL | 3 refills | Status: DC

## 2017-11-05 MED FILL — Morphine Sulfate Inj 10 MG/ML: INTRAMUSCULAR | Qty: 2 | Status: AC

## 2017-11-05 MED FILL — Heparin Sod (Porcine)-NaCl IV Soln 1000 Unit/500ML-0.9%: INTRAVENOUS | Qty: 1000 | Status: AC

## 2017-11-05 NOTE — Discharge Instructions (Signed)
PLEASE REMEMBER TO BRING ALL OF YOUR MEDICATIONS TO EACH OF YOUR FOLLOW-UP OFFICE VISITS.  PLEASE ATTEND ALL SCHEDULED FOLLOW-UP APPOINTMENTS.   Activity: Increase activity slowly as tolerated. You may shower, but no soaking baths (or swimming) for 1 week. No driving for 24 hours. No lifting over 5 lbs for 1 week. No sexual activity for 1 week.   You May Return to Work: in 1 week (if applicable)  Wound Care: You may wash cath site gently with soap and water. Keep cath site clean and dry. If you notice pain, swelling, bleeding or pus at your cath site, please call [phone].  It is VERY IMPORTANT that you do not miss any doses of your aspirin and brilinta. Missing doses of these medication can put you at risk of forming blockages in your stents had having a heart attack.

## 2017-11-05 NOTE — Progress Notes (Signed)
CARDIAC REHAB PHASE I   PRE:  Rate/Rhythm: 82 SR    BP: sitting 126/58    SaO2:   MODE:  Ambulation: 460 ft   POST:  Rate/Rhythm: 97 SR    BP: sitting 130/60     SaO2:   Tolerated well, no SOB, much improved from PTA (had SOB/CP with 30 ft). Ed completed with good reception. Understands Brilinta. Will refer to [place]. Encouraged more exercise.  [id number]   [place], ACSM 11/05/2017 10:00 AM

## 2017-11-05 NOTE — Discharge Summary (Signed)
Discharge Summary    Patient ID: [name] [name],  MRN: [id number], DOB/AGE: June 23, 1955 62 y.o.  Admit date: 11/04/2017 Discharge date: 11/05/2017  Primary Care Provider: Penni Homans A Primary Cardiologist: Shirlee More, MD  Discharge Diagnoses    Principal Problem:   Accelerating angina [facility]) Active Problems:   Abnormal cardiac CT angiography   Coronary artery disease due to calcified coronary lesion   Pure hypercholesterolemia   Allergies Allergies  Allergen Reactions  . Epinephrine Other (See Comments)    Pass out   . Statins Other (See Comments)    Myalgias: Simvastatin also caused back ache  . Welchol [Colesevelam Hcl]     myalgia  . Amoxicillin Itching and Rash    Has patient had a PCN reaction causing immediate rash, facial/tongue/throat swelling, SOB or lightheadedness with hypotension: No Has patient had a PCN reaction causing severe rash involving mucus membranes or skin necrosis: No Has patient had a PCN reaction that required hospitalization: No Has patient had a PCN reaction occurring within the last 10 years: Yes If all of the above answers are "NO", then may proceed with Cephalosporin use.     Diagnostic Studies/Procedures    Left heart catheterization 11/04/17: Conclusions: 1. Severe single-vessel coronary artery disease with 60% ostial/proximal LAD stenosis (FFR 0.73, IVUS MLA 4.5 mm^2). 2. Mild to moderate, non-obstructive coronary artery disease involving ramus, LCx, and RCA. 3. Upper normal left ventricular filling pressure. 4. Successful FFR and IVUS-guided PCI to ostial/proximal LAD using overlapping Xience Sierra 3.0 x 18 mm and 2.75 x 8 mm drug-eluting stents with 0% residual stenosis and TIMI-3 flow. 5. Occlusion of small (<2 mm) D1 branch after being jailed by proximal LAD stent.  Chest pain improved with medical therapy; intervention was not attempted due to small vessel size.  Recommendations: 1. Dual antiplatelet therapy with aspirin  and ticagrelor for at least 6 months, ideally indefinitely. 2. Aggressive secondary prevention.  Consider retrial of statin or initiation of PCSK9 inhibitor. 3. Overnight extended recovery; likely discharge in the morning. _____________   History of Present Illness     [name] [name] is a 62 y.o. female who presents for evaluation of chest pain.  Dr. Bettina Gavia saw her in April.   She had an echo which was unremarkable.  It was suggested that she should have a coronary CTA. She reports DOE after walking 30 feet on level ground.  She reports that her legs will "blow up" with swelling when she is walking.  She has had an echo with a very small pericardial effusion but no other significant findings.  She has seen Dr. Vaughan Browner and PFTs are pending.  She was unsatisfied that she had to wait for testing and wanted to have a second opinion about possible cardiac etiologies.  She has had no prior cardiac disease.  She had a cardiac cath in 2013 when she was seen by Dr. Mare Ferrari for evaluation of chest pain which was normal. She was referred to [facility] for cardiac catheterization to evaluate abnormal CTA with proximal LAD stenosis.    Hospital Course     Consultants: None   1. CAD: she presented outpatient with complaints of DOE. She underwent outpatient Coronary CTA which was concerning for significant proximal LAD stenosis. She was referred to [facility] for [place] which occurred 11/04/17 and revealed 60% osteal/proximal LAD stenosis managed with overlapping PCI/DES, and mild-mod non-obstructive CAD involvimg ramus, LCx, and RCA. She was recommended for DAPT for at least 6 months (if  not indefinitely), and aggressive secondary prevention.  - Continue ASA and brilinta  2. HTN: BP stable this admission - Continue home diltiazem and triamterene-HCTZ (to restart 11/06/17) - Home metoprolol decreased to 65m BID  3. HLD: LDL 145 08/2017; intolerant to statins - Referral made to lipid clinic for PSK-9 inhibitor consideration -  Continue fenofibrate and niacin  4. DM type 2: Hgb A1C 6.6 07/2017; at goal of <7 - Continue home glycemic regimen - Instructed patient to restart metformin 11/07/17  [facility]. [facility] Anticipate she can return to Dr. MBettina Gaviafollowing this visit. _____________  Discharge Vitals Blood pressure (!) 131/59, pulse 75, temperature 98.3 F (36.8 C), resp. rate 15, height _0  (1.676 m), weight 167 lb 8.8 oz (76 kg), SpO2 98 %.  Filed Weights   11/04/17 0546 11/05/17 0600  Weight: 170 lb (77.1 kg) 167 lb 8.8 oz (76 kg)    Labs & Radiologic Studies    CBC Recent Labs    11/04/17 1240 11/05/17 0230  WBC 8.6 8.9  HGB 10.7* 9.4*  HCT 34.8* 31.0*  MCV 80.9 80.7  PLT 293 2580  Basic Metabolic Panel Recent Labs    11/03/17 1046 11/04/17 1240 11/05/17 0230  NA 140  --  138  K 4.5  --  3.9  CL 102  --  106  CO2 23  --  23  GLUCOSE 139*  --  152*  BUN 18  --  13  CREATININE 1.17* 0.98 1.09*  CALCIUM 10.0  --  8.7*   Liver Function Tests No results for input(s): AST, ALT, ALKPHOS, BILITOT, PROT, ALBUMIN in the last 72 hours. No results for input(s): LIPASE, AMYLASE in the last 72 hours. Cardiac Enzymes No results for input(s): CKTOTAL, CKMB, CKMBINDEX, TROPONINI in the last 72 hours. BNP Invalid input(s): POCBNP D-Dimer No results for input(s): DDIMER in the last 72 hours. Hemoglobin A1C No results for input(s): HGBA1C in the last 72 hours. Fasting Lipid Panel No results for input(s): CHOL, HDL, LDLCALC, TRIG, CHOLHDL, LDLDIRECT in the last 72 hours. Thyroid Function Tests Recent Labs    11/03/17 1046  TSH 4.960*   _____________  Ct Coronary Morph W/cta Cor W/score W/ca W/cm &/or Wo/cm  Addendum Date: 10/31/2017   ADDENDUM REPORT: 10/31/2017 13:32 CLINICAL DATA:  62year old female with hypertension, hyperlipidemia and  SOB. EXAM: Cardiac/Coronary  CT TECHNIQUE: The patient was scanned on a [employer] FINDINGS: A 120 kV prospective scan was triggered in the descending thoracic aorta at 111 HU's. Axial non-contrast 3 mm slices were carried out through the heart. The data set was analyzed on a dedicated work station and scored using the [employer] G[employer] rotation speed was 250 msecs and collimation was .6 mm. 5 mg of iv Metoprolol and 0.8 mg of sl NTG was given. The 3D data set was reconstructed in 5% intervals of the 67-82 % of the R-R cycle. Diastolic phases were analyzed on a dedicated work station using MPR, MIP and VRT modes. The patient received 80 cc of contrast. Aorta:  Normal size.  No calcifications.  No dissection. Aortic Valve:  Trileaflet.  No calcifications. Coronary Arteries:  Normal coronary origin.  Right dominance. RCA is a large dominant artery that gives rise to PDA and PLVB. There is minimal calcified plaque at the ostial RCA with associated stenosis 0-25%. Mid  and distal RCA have minimal plaque. PDA and PLA are suboptimally dilated but have no obvious plaque. Left main is a large artery that gives rise to LAD, a small ramus intermedius and LCX arteries. Left main has mild distal calcified plaque with associated stenosis 25-50%. LAD is a large vessel that wraps around the apex. Ostial/proximal LAD has a severe non-calcified plaque with associated stenosis >70%, this is followed by severe calcified plaque with stenosis 50-69%. Mid and distal LAD have minimal plaque. D1 is a small lumen vessel that has minimal flow as originates at the area with severe LAD stenosis. RI is very small and has moderate plaque. LCX is a relatively small non-dominant artery that gives rise to one OM1 branch. Ostial LCX has a moderate calcified plaque with stenosis 50-69%. Mid LCX after the takeoff of OM1 appears to have severe non-calcified plaque with stenosis > 70%. OM1 appears to have at least moderate plaque in the  proximal segment with possible stenosis > 70%. Other findings: Normal pulmonary vein drainage into the left atrium. Normal let atrial appendage without a thrombus. Normal size of the pulmonary artery. IMPRESSION: 1. Coronary calcium score of 190. This was 34 percentile for age and sex matched control. 2. Normal coronary origin with right dominance. 3. Severe stenosis in the ostial/proximal LAD. 4. Moderate disease in the ostial LCX, severe stenosis in the mid LCX artery and possible severe stenosis in the ostial OM1. Cardiac catheterization is recommended. Electronically Signed   By: Ena Dawley   On: 10/31/2017 13:32   Result Date: 10/31/2017 EXAM: OVER-READ INTERPRETATION  CT CHEST The following report is an over-read performed by radiologist Dr. Rolm Baptise of [facility], [place] on 10/31/2017. This over-read does not include interpretation of cardiac or coronary anatomy or pathology. The coronary CTA interpretation by the cardiologist is attached. COMPARISON:  None. FINDINGS: Vascular: Heart is normal size. Visualized aorta is normal caliber. Scattered calcifications in the visualized descending thoracic aorta. Mediastinum/Nodes: No adenopathy in the lower mediastinum or hila. Lungs/Pleura: Visualized lungs clear.  No effusions. Upper Abdomen: Imaging into the upper abdomen shows no acute findings. Musculoskeletal: Bilateral breast implants noted. No acute bony abnormality. IMPRESSION: No acute extra cardiac abnormality. Scattered descending aortic calcifications/atherosclerosis. Electronically Signed: By: Rolm Baptise M.D. On: 10/31/2017 10:29   Ct Coronary Fractional Flow Reserve Data Prep  Result Date: 11/01/2017 EXAM: FF/RCT ANALYSIS FINDINGS: FFRct analysis was performed on the original cardiac CT angiogram dataset. Diagrammatic representation of the FFRct analysis is provided in a separate PDF document in PACS. This dictation was created using the PDF document and an interactive 3D model of the  results. 3D model is not available in the EMR/PACS. Normal FFR range is >0.80. 1. Left Main:  No significant stenosis. 2. LAD: CT FFR in the proximal LAD: 0.73. 3. LCX: No significant stenosis. 4. RCA: No significant stenosis. IMPRESSION: 1. CT FFR analysis showed significant stenosis in the proximal LAD, a cardiac catheterization is recommended. Electronically Signed   By: Ena Dawley   On: 11/01/2017 08:45   Disposition   Patient was seen and examined by Dr. Radford Pax who deemed patient as stable for discharge. Follow-up has been arranged. Discharge medications as listed below.   Follow-up Plans & Appointments    Follow-up Information    [facility] Follow up on 11/25/2017.   Specialty:  Cardiology Why:  Please arrive 15 minutes early for your 11:30am Lipid Clinic appointment to meet with a pharmacist to discuss medications to improve your  cholesterol control Contact information: [place] [phone]       Erlene Quan, PA-C Follow up on 11/14/2017.   Specialties:  Cardiology, Radiology Why:  Please arrive 15 minutes early for your 8:30am Cardiology appointment Contact information: [address] [place] [address] [phone]          Discharge Instructions    Diet - low sodium heart healthy   Complete by:  As directed    Increase activity slowly   Complete by:  As directed       Discharge Medications   Allergies as of 11/05/2017      Reactions   Epinephrine Other (See Comments)   Pass out    Statins Other (See Comments)   Myalgias: Simvastatin also caused back ache   Welchol [colesevelam Hcl]    myalgia   Amoxicillin Itching, Rash   Has patient had a PCN reaction causing immediate rash, facial/tongue/throat swelling, SOB or lightheadedness with hypotension: No Has patient had a PCN reaction causing severe rash involving mucus membranes or skin necrosis: No Has patient had a PCN  reaction that required hospitalization: No Has patient had a PCN reaction occurring within the last 10 years: Yes If all of the above answers are "NO", then may proceed with Cephalosporin use.      Medication List    TAKE these medications   ACCU-CHEK FASTCLIX LANCETS Misc Check blood sugar once daily Dz:E11.9   ACCU-CHEK NANO SMARTVIEW w/Device Kit Check blood sugar once daily   albuterol 108 (90 Base) MCG/ACT inhaler Commonly known as:  PROAIR HFA Inhale 2 puffs into the lungs every 6 (six) hours as needed for wheezing or shortness of breath.   aspirin 81 MG tablet Take 81 mg by mouth daily.   B-D ULTRAFINE III SHORT PEN 31G X 8 MM Misc Generic drug:  Insulin Pen Needle INJECT ONCE DAILY   CALTRATE 600 PO Take 600 mg by mouth 2 (two) times daily.   diltiazem 240 MG 24 hr capsule Commonly known as:  CARDIZEM CD Take 1 capsule (240 mg total) by mouth daily.   FARXIGA 10 MG Tabs tablet Generic drug:  dapagliflozin propanediol TAKE 1 TABLET EVERY DAY   fenofibrate 160 MG tablet TAKE 1 TABLET BY MOUTH EVERY DAY   fluticasone furoate-vilanterol 200-25 MCG/INH Aepb Commonly known as:  BREO ELLIPTA Inhale 1 puff into the lungs daily.   Fluticasone-Salmeterol 250-50 MCG/DOSE Aepb Commonly known as:  ADVAIR DISKUS INHALE 1 PUFF INTO LUNGS 2 TIMES DAILY What changed:    how much to take  how to take this  when to take this  reasons to take this  additional instructions   glucose blood test strip Commonly known as:  ACCU-CHEK SMARTVIEW Check blood sugars once daily   Krill Oil 350 MG Caps Take 350 mg by mouth daily.   levothyroxine 88 MCG tablet Commonly known as:  SYNTHROID, LEVOTHROID TAKE 1 TABLET BY MOUTH EVERY DAY   Magnesium 250 MG Tabs Take 250-500 mg by mouth See admin instructions. Take 291m by mouth Sunday, Tuesday, Thursday and Saturday and 5052mMonday, Wednesday and Friday   metFORMIN 500 MG 24 hr tablet Commonly known as:   GLUCOPHAGE-XR TAKE 2 TABLETS EVERY DAY What changed:    how much to take  how to take this  when to take this Notes to patient:  You can restart this medication 11/07/17 in the morning.   metoprolol tartrate 25 MG  tablet Commonly known as:  LOPRESSOR Take 1 tablet (25 mg total) by mouth 2 (two) times daily. What changed:    medication strength  how much to take  when to take this  additional instructions   niacin 500 MG CR tablet Commonly known as:  NIASPAN 1 tab po qhs with lowfat snack and Aspirin 1/2 hour prior  Failed all statins and welchol   nitroGLYCERIN 0.4 MG SL tablet Commonly known as:  NITROSTAT Place 1 tablet (0.4 mg total) under the tongue every 5 (five) minutes as needed for chest pain.   OSTEO BI-FLEX REGULAR STRENGTH 250-200 MG Tabs Generic drug:  Glucosamine-Chondroitin Take 1 tablet by mouth 2 (two) times daily.   pantoprazole 40 MG tablet Commonly known as:  PROTONIX TAKE 1 TABLET BY MOUTH EVERY DAY   pioglitazone 30 MG tablet Commonly known as:  ACTOS TAKE 1 TABLET (30 MG TOTAL) BY MOUTH DAILY.   POTASSIUM CITRATE PO Take 1 tablet by mouth daily.   PROBIOTIC DAILY PO Take 99 mg by mouth daily.   repaglinide 0.5 MG tablet Commonly known as:  PRANDIN TAKE 1 TABLET (0.5 MG TOTAL) BY MOUTH DAILY WITH SUPPER.   ticagrelor 90 MG Tabs tablet Commonly known as:  BRILINTA Take 1 tablet (90 mg total) by mouth 2 (two) times daily.   ticagrelor 90 MG Tabs tablet Commonly known as:  BRILINTA Take 1 tablet (90 mg total) by mouth 2 (two) times daily.   triamterene-hydrochlorothiazide 37.5-25 MG tablet Commonly known as:  MAXZIDE-25 TAKE 1/2 TABLET BY MOUTH EVERY DAY Notes to patient:  You can restart this medication 11/06/17 in the morning.   VICTOZA 18 MG/3ML Sopn Generic drug:  liraglutide INJECT 1.8MG(0.3ML) DAILY FOR 90 DAYS        Aspirin prescribed at discharge?  Yes High Intensity Statin Prescribed? (Lipitor 40-61m or Crestor  20-456m: No: intolerant to statins Beta Blocker Prescribed? Yes For EF <40%, was ACEI/ARB Prescribed? No: EF 60-65% ADP Receptor Inhibitor Prescribed? (i.e. Plavix etc.-Includes Medically Managed Patients): Yes For EF <40%, Aldosterone Inhibitor Prescribed? No: EF 60-65% Was EF assessed during THIS hospitalization? No: Done prior to admission Was Cardiac Rehab II ordered? (Included Medically managed Patients): Yes   Outstanding Labs/Studies   Referred to lipid clinic for PSK-9 inhibitor consideration  Duration of Discharge Encounter   Greater than 30 minutes including physician time.  Signed, KrAbigail ButtsA-C 11/05/2017, 9:55 AM

## 2017-11-07 ENCOUNTER — Encounter (HOSPITAL_COMMUNITY): Payer: Self-pay | Admitting: Internal Medicine

## 2017-11-07 ENCOUNTER — Telehealth (HOSPITAL_COMMUNITY): Payer: Self-pay

## 2017-11-07 ENCOUNTER — Telehealth: Payer: Self-pay

## 2017-11-07 NOTE — Telephone Encounter (Signed)
11/07/17  Transition Care Management Follow-up Telephone Call  ADMISSION DATE: 11/04/2017  DISCHARGE DATE: 11/05/2017   How have you been since you were released from the hospital?  Patient states she feels much beter.  Do you understand why you were in the hospital? Yes   Do you understand the discharge instrcutions? Yes    Items Reviewed:  Medications reviewed: Yes patient not taking Breo Ellipta.    Allergies reviewed: Yes   Dietary changes reviewed: Low sodium Heart healthy   Referrals reviewed: Appointment has been made by patient for follow up.   Functional Questionnaire:   Activities of Daily Living (ADLs): Patient states she can perform all ADL'S independently.    Any patient concerns?  Yes patient would like to have Thyroid evaluated.    Confirmed importance and date/time of follow-up visits scheduled: Yes   Confirmed with patient if condition begins to worsen call PCP or go to the ER. Yes    Patient was given the office number and encouragred to call back with questions or concerns. Yes

## 2017-11-07 NOTE — Telephone Encounter (Signed)
Patients insurance is active and benefits verified through [place] - No co-pay, deductible amount of $200.00/$200.00 has been met, out of pocket amount of $500.00/$349.19 has been met, 20% co-insurance, and no pre-authorization is required. Passport/reference [id number]  Will contact patient to see if she is interested in the Cardiac Rehab Program. If interested, patient will be contacted for scheduling upon review by the RN Navigator.

## 2017-11-10 ENCOUNTER — Ambulatory Visit: Payer: BLUE CROSS/BLUE SHIELD | Admitting: Family Medicine

## 2017-11-10 ENCOUNTER — Encounter: Payer: Self-pay | Admitting: Family Medicine

## 2017-11-10 VITALS — BP 112/58 | HR 97 | Temp 97.9°F | Resp 18 | Wt 169.2 lb

## 2017-11-10 DIAGNOSIS — M7989 Other specified soft tissue disorders: Secondary | ICD-10-CM

## 2017-11-10 DIAGNOSIS — I10 Essential (primary) hypertension: Secondary | ICD-10-CM

## 2017-11-10 DIAGNOSIS — E039 Hypothyroidism, unspecified: Secondary | ICD-10-CM | POA: Diagnosis not present

## 2017-11-10 DIAGNOSIS — E7800 Pure hypercholesterolemia, unspecified: Secondary | ICD-10-CM

## 2017-11-10 DIAGNOSIS — I2584 Coronary atherosclerosis due to calcified coronary lesion: Secondary | ICD-10-CM

## 2017-11-10 DIAGNOSIS — I2510 Atherosclerotic heart disease of native coronary artery without angina pectoris: Secondary | ICD-10-CM

## 2017-11-10 DIAGNOSIS — G8929 Other chronic pain: Secondary | ICD-10-CM

## 2017-11-10 DIAGNOSIS — D649 Anemia, unspecified: Secondary | ICD-10-CM | POA: Diagnosis not present

## 2017-11-10 DIAGNOSIS — E1143 Type 2 diabetes mellitus with diabetic autonomic (poly)neuropathy: Secondary | ICD-10-CM | POA: Diagnosis not present

## 2017-11-10 DIAGNOSIS — M79601 Pain in right arm: Secondary | ICD-10-CM | POA: Insufficient documentation

## 2017-11-10 HISTORY — DX: Other chronic pain: G89.29

## 2017-11-10 LAB — COMPREHENSIVE METABOLIC PANEL
ALBUMIN: 4.4 g/dL (ref 3.5–5.2)
ALK PHOS: 47 U/L (ref 39–117)
ALT: 15 U/L (ref 0–35)
AST: 16 U/L (ref 0–37)
BILIRUBIN TOTAL: 0.4 mg/dL (ref 0.2–1.2)
BUN: 26 mg/dL — ABNORMAL HIGH (ref 6–23)
CALCIUM: 10.1 mg/dL (ref 8.4–10.5)
CO2: 25 meq/L (ref 19–32)
Chloride: 101 mEq/L (ref 96–112)
Creatinine, Ser: 1.24 mg/dL — ABNORMAL HIGH (ref 0.40–1.20)
GFR: 46.42 mL/min — AB (ref >60.00)
Glucose, Bld: 148 mg/dL — ABNORMAL HIGH (ref 70–99)
Potassium: 3.9 mEq/L (ref 3.5–5.1)
Sodium: 138 mEq/L (ref 135–145)
TOTAL PROTEIN: 7.3 g/dL (ref 6.0–8.3)

## 2017-11-10 LAB — CBC
HEMATOCRIT: 34.3 % — AB (ref 36.0–46.0)
HEMOGLOBIN: 11.2 g/dL — AB (ref 12.0–15.0)
MCHC: 32.7 g/dL (ref 30.0–36.0)
MCV: 77.4 fl — ABNORMAL LOW (ref 78.0–100.0)
PLATELETS: 369 10*3/uL (ref 150.0–400.0)
RBC: 4.44 Mil/uL (ref 3.87–5.11)
RDW: 15.4 % (ref 11.5–15.5)
WBC: 8.9 10*3/uL (ref 4.0–10.5)

## 2017-11-10 LAB — FERRITIN: Ferritin: 9.1 ng/mL — ABNORMAL LOW (ref 10.0–291.0)

## 2017-11-10 LAB — IBC PANEL
IRON: 40 ug/dL — AB (ref 42–145)
SATURATION RATIOS: 5.3 % — AB (ref 20.0–50.0)
TRANSFERRIN: 541 mg/dL — AB (ref 212.0–360.0)

## 2017-11-10 MED ORDER — LEVOTHYROXINE SODIUM 88 MCG PO TABS: ORAL_TABLET | ORAL | 0 refills | Status: DC

## 2017-11-10 NOTE — Assessment & Plan Note (Signed)
Under

## 2017-11-10 NOTE — Patient Instructions (Signed)
Coronary Artery Disease, Female Coronary artery disease (CAD) is a condition in which the arteries that lead to the heart (coronary arteries) become narrow or blocked. The narrowing or blockage can lead to decreased blood flow to the heart. Prolonged reduced blood flow can cause a heart attack (myocardial infarction or MI). This condition may also be called coronary heart disease. Because CAD is the leading cause of death in women, it is important to understand what causes this condition and how it is treated. What are the causes? CAD is most often caused by atherosclerosis. This is the buildup of fat and cholesterol (plaque) on the inside of the arteries. Over time, the plaque may narrow or block the artery, reducing blood flow to the heart. Plaque can also become weak and break off within a coronary artery and cause a sudden blockage. Other less common causes of CAD include:  An embolism or blood clot in a coronary artery.  A tearing of the artery (spontaneous coronary artery dissection).  An aneurysm.  Inflammation (vasculitis) in the artery wall.  What increases the risk? The following factors may make you more likely to develop this condition:  Age. Women over age 55 are at a greater risk of CAD.  Family history of CAD.  High blood pressure (hypertension).  Diabetes.  High cholesterol levels.  Tobacco use.  Lack of exercise.  Menopause. ? All postmenopausal women are at greater risk of CAD. ? Women who have experienced menopause between the ages of 40-45 (early menopause) are at a higher risk of CAD. ? Women who have experienced menopause before age 40 (premature menopause) are at a very high risk of CAD.  Excessive alcohol use  A diet high in saturated and trans fats, such as fried food and processed meat.  Other possible risk factors include:  High stress levels.  Depression  Obesity.  Sleep apnea.  What are the signs or symptoms? Many people do not have any  symptoms during the early stages of CAD. As the condition progresses, symptoms may include:  Chest pain (angina). The pain can: ? Feel like crushing or squeezing, or a tightness, pressure, fullness, or heaviness in the chest. ? Last more than a few minutes or can stop and recur. The pain tends to get worse with exercise or stress and to fade with rest.  Pain in the arms, neck, jaw, or back.  Unexplained heartburn or indigestion.  Shortness of breath.  Nausea.  Sudden cold sweats.  Sudden light-headedness.  Fluttering or fast heartbeat (palpitations).  Many women have chest discomfort and the other symptoms. However, women often have unusual (atypical) symptoms, such as:  Fatigue.  Vomiting.  Unexplained feelings of nervousness or anxiety.  Unexplained weakness.  Dizziness or fainting.  How is this diagnosed? This condition is diagnosed based on:  Your family and medical history.  A physical exam.  Tests, including: ? A test to check the electrical signals in your heart (electrocardiogram). ? Exercise stress test. This looks for signs of blockage when the heart is stressed with exercise, such as running on a treadmill. ? Pharmacologic stress test. This test looks for signs of blockage when the heart is being stressed with a medicine. ? Blood tests. ? Coronary angiogram. This is a procedure to look at the coronary arteries to see if there is any blockage. During this test, a dye is injected into your arteries so they appear on an X-ray. ? A test that uses sound waves to take a picture of   your heart (echocardiogram). ? Chest X-ray.  How is this treated? This condition may be treated by:  Healthy lifestyle changes to reduce risk factors.  Medicines such as: ? Antiplatelet medicines and blood-thinning medicines, such as aspirin. These help prevent blood clots. ? Nitroglycerin. ? Blood pressure medicines. ? Cholesterol-lowering medicine.  Coronary angioplasty and  stenting. During this procedure, a thin, flexible tube is inserted through a blood vessel and into a blocked artery. A balloon or similar device on the end of the tube is inflated to open up the artery. In some cases, a small, mesh tube (stent) is inserted into the artery to keep it open.  Coronary artery bypass surgery. During this surgery, veins or arteries from other parts of the body are used to create a bypass around the blockage and allow blood to reach your heart.  Follow these instructions at home: Medicines  Take over-the-counter and prescription medicines only as told by your health care provider.  Do not take the following medicines unless your health care provider approves: ? NSAIDs, such as ibuprofen, naproxen, or celecoxib. ? Vitamin supplements that contain vitamin A, vitamin E, or both. ? Hormone replacement therapy that contains estrogen with or without progestin. Lifestyle  Follow an exercise program approved by your health care provider. Aim for 150 minutes of moderate exercise or 75 minutes of vigorous exercise each week.  Maintain a healthy weight or lose weight as approved by your health care provider.  Rest when you are tired.  Learn to manage stress or try to limit your stress. Ask your health care provider for suggestions if you need help.  Get screened for depression and seek treatment, if needed.  Do not use any products that contain nicotine or tobacco, such as cigarettes and e-cigarettes. If you need help quitting, ask your health care provider.  Do not use illegal drugs. Eating and drinking  Follow a heart-healthy diet. A dietitian can help educate you about healthy food options and changes. In general, eat plenty of fruits and vegetables, lean meats, and whole grains.  Avoid foods high in: ? Sugar. ? Salt (sodium). ? Saturated fats, such as processed or fatty meat. ? Trans fats, such as fried food.  Use healthy cooking methods such as roasting,  grilling, broiling, baking, poaching, steaming, or stir-frying.  If you drink alcohol, and your health care provider approves, limit your alcohol intake to no more than 1 drink per day. One drink equals 12 ounces of beer, 5 ounces of wine, or 1 ounces of hard liquor. General instructions  Manage any other health conditions, such as hypertension and diabetes. These conditions affect your heart.  Your health care provider may ask you to monitor your blood pressure. Ideally, your blood pressure should be below 130/80.  Keep all follow-up visits as told by your health care provider. This is important. Get help right away if:  You have pain in your chest, neck, arm, jaw, stomach, or back that: ? Lasts more than a few minutes. ? Is recurring. ? Is not relieved by taking medicine under your tongue (sublingualnitroglycerin).  You have profuse sweating without cause.  You have unexplained: ? Heartburn or indigestion. ? Shortness of breath or difficulty breathing. ? Fluttering or fast heartbeat (palpitations). ? Nausea or vomiting. ? Fatigue. ? Feelings of nervousness or anxiety. ? Weakness. ? Diarrhea.  You have sudden light-headedness or dizziness.  You faint.  You feel like hurting yourself or think about taking your own life. These symptoms may represent  a serious problem that is an emergency. Do not wait to see if the symptoms will go away. Get medical help right away. Call your local emergency services (911 in the [place] Do not drive yourself to the hospital. °Summary °· Coronary artery disease (CAD) is a process in which the arteries that lead to the heart (coronary arteries) become narrow or blocked. The narrowing or blockage can lead to a heart attack. °· Many women have chest discomfort and other common symptoms of CAD. However, women often have different (atypical) symptoms, such as fatigue, vomiting, and dizziness or weakness. °· CAD can be treated with lifestyle changes,  medicines, surgery, or a combination of these treatments. °This information is not intended to replace advice given to you by your health care provider. Make sure you discuss any questions you have with your health care provider. °Document Released: 08/05/2011 Document Revised: 05/03/2016 Document Reviewed: 05/03/2016 °[employer] Interactive Patient Education © 2018 [employer] ° °

## 2017-11-10 NOTE — Assessment & Plan Note (Signed)
Sp

## 2017-11-10 NOTE — Assessment & Plan Note (Signed)
TSH had increased to above 4 will increase Levothyroxine 88 mcg tabs to 1 tab po daily x 5 days a week and 2 tabs on Tues and Sat recheck in 8 weeks.

## 2017-11-10 NOTE — Assessment & Plan Note (Signed)
Well controlled, no changes to meds. Encouraged heart healthy diet such as the DASH diet and exercise as tolerated.  °

## 2017-11-10 NOTE — Assessment & Plan Note (Signed)
Tolerating statin, encouraged heart healthy diet, avoid trans fats, minimize simple carbs and saturated fats. Increase exercise as tolerated 

## 2017-11-10 NOTE — Assessment & Plan Note (Signed)
hgba1c acceptable, minimize simple carbs. Increase exercise as tolerated. Continue current meds 

## 2017-11-10 NOTE — Progress Notes (Signed)
Subjective:  I acted as a Education administrator for Dr. Charlett Blake. [place], [place]  Patient ID: [name] [name], female    DOB: August 16, 1955, 62 y.o.   MRN: [id number]  No chief complaint on file.   HPI  Patient is in today for a hospital follow up and overall she is improved no further chest pain noted. No recent febrile illness. Denies CP/palp/SOB/HA/congestion/fevers/GI or GU c/o. Taking meds as prescribed  Patient Care Team: Mosie Lukes, MD as PCP - General (Family Medicine) Richardo Priest, MD as PCP - Cardiology (Cardiology) Herminio Commons, DO (Osteopathic Medicine)   Past Medical History:  Diagnosis Date  . Abdominal pain 08/10/2014  . Allergic rhinitis 08/24/2010  . Anterior neck pain 08/10/2014  . Asthma   . Asthma with acute exacerbation 04/27/2011  . Atypical chest pain    normal coronaries and LV function by cath 10/2011  . BACK PAIN 03/01/2009   Qualifier: Diagnosis of  By: Wynona Luna   . Cervical cancer screening 10/27/2012   Menarche at 30, regular S/p partial hysterectomy at 26 for precancer changes persistent despite cryo. Both ovaries left in place. No HRT Menopause early 16s G1P1, s/p 1 svd s/p episiomtomy and tear with stitches MGM up to date, h/o calcium deposits repeat MGMs stable   . Chest pain 11/20/2011  . Chronic pain disorder    neck and shoulder  . CTS (carpal tunnel syndrome) 01/27/2013   right  . Diabetes mellitus type II   . Diabetic gastroparesis ([place])   . Esophageal reflux 08/25/2013   [place], Dr Barth Kirks  . Fatty liver    abnormal transaminases; negative work up  . FATTY LIVER DISEASE 04/27/2007   Qualifier: Diagnosis of  By: Danelle Earthly CMA, Darlene    . Focal muscle atrophy 09/26/2015  . Fundic gland polyps of stomach, benign   . GERD (gastroesophageal reflux disease)   . Hip pain, bilateral 07/19/2012  . HTN (hypertension)   . Hyperlipidemia, mixed 05/04/2007   Qualifier: Diagnosis of  By: Wynona Luna crestor caused myalgias even at low dose  Livalo caused myalgias Lipitor  Mother with severe reaction myalgias Simvastatin, Welchol   . Hypertension 08/24/2010  . Hypokalemia 05/25/2013   Improved stopping HCTZ. Was noted to have an elevated glucose when K was low. Recheck renal next week after starting Maxzide  . Hypomagnesemia 08/10/2014  . Hypothyroidism   . Laryngitis 08/25/2017  . Leg cramps 04/15/2016  . Nausea without vomiting 08/08/2014  . NONSPEC ELEVATION OF LEVELS OF TRANSAMINASE/LDH 05/01/2007   Qualifier: Diagnosis of  By: Garner Gavel    . Obesity 10/22/2016  . Osteoarthritis    chronic, right knee (11/04/2017)  . Other malaise and fatigue 01/27/2013  . OVARIAN CYST 07/11/2009   Qualifier: Diagnosis of  By: Wynona Luna   . Pain of both thighs 08/08/2014  . Plantar fasciitis   . Pneumonia    "twice" (11/04/2017)  . Preventative health care 04/15/2016  . Rosacea 10/22/2016    Past Surgical History:  Procedure Laterality Date  . AUGMENTATION MAMMAPLASTY Bilateral 2002  . COLONOSCOPY    . CORONARY STENT INTERVENTION N/A 11/04/2017   Procedure: CORONARY STENT INTERVENTION;  Surgeon: Nelva Bush, MD;  Location: [facility];  Service: Cardiovascular;  Laterality: N/A;  . ESOPHAGOGASTRODUODENOSCOPY    . INTRAVASCULAR PRESSURE WIRE/FFR STUDY N/A 11/04/2017   Procedure: INTRAVASCULAR PRESSURE WIRE/FFR STUDY;  Surgeon: Nelva Bush, MD;  Location: [facility];  Service:  Cardiovascular;  Laterality: N/A;  . INTRAVASCULAR ULTRASOUND/IVUS N/A 11/04/2017   Procedure: Intravascular Ultrasound/IVUS;  Surgeon: Nelva Bush, MD;  Location: [facility];  Service: Cardiovascular;  Laterality: N/A;  . KNEE ARTHROSCOPY  1998, [id number]  . LAPAROSCOPIC CHOLECYSTECTOMY    . LEFT HEART CATH AND CORONARY ANGIOGRAPHY N/A 11/04/2017   Procedure: LEFT HEART CATH AND CORONARY ANGIOGRAPHY;  Surgeon: Nelva Bush, MD;  Location: [facility];  Service: Cardiovascular;  Laterality: N/A;  . LEFT HEART  CATHETERIZATION WITH CORONARY ANGIOGRAM N/A 11/21/2011   Procedure: LEFT HEART CATHETERIZATION WITH CORONARY ANGIOGRAM;  Surgeon: Josue Hector, MD;  Location: [facility];  Service: Cardiovascular;  Laterality: N/A;  . TONSILLECTOMY  1975  . VAGINAL HYSTERECTOMY  1983   "still have my ovaries"    Family History  Problem Relation Age of Onset  . Alzheimer's disease Mother   . Diabetes type II Mother   . Hiatal hernia Mother   . Diabetes Mother   . Emphysema Father   . COPD Father   . Depression Sister        suicide  . Diabetes Sister   . Diabetes Daughter   . Colon cancer Neg Hx   . Stomach cancer Neg Hx     Social History   Socioeconomic History  . Marital status: Married    Spouse name: Not on file  . Number of children: 1  . Years of education: Not on file  . Highest education level: Not on file  Occupational History  . Occupation: IT TECH, admin. assistant    Employer: [employer]  Social Needs  . Financial resource strain: Not on file  . Food insecurity:    Worry: Not on file    Inability: Not on file  . Transportation needs:    Medical: Not on file    Non-medical: Not on file  Tobacco Use  . Smoking status: Never Smoker  . Smokeless tobacco: Never Used  Substance and Sexual Activity  . Alcohol use: Not Currently  . Drug use: Never  . Sexual activity: Not Currently  Lifestyle  . Physical activity:    Days per week: Not on file    Minutes per session: Not on file  . Stress: Not on file  Relationships  . Social connections:    Talks on phone: Not on file    Gets together: Not on file    Attends religious service: Not on file    Active member of club or organization: Not on file    Attends meetings of clubs or organizations: Not on file    Relationship status: Not on file  . Intimate partner violence:    Fear of current or ex partner: Not on file    Emotionally abused: Not on file    Physically abused: Not on file    Forced sexual activity: Not on  file  Other Topics Concern  . Not on file  Social History Narrative   Multiple family members with intolerance to statins.    Outpatient Medications Prior to Visit  Medication Sig Dispense Refill  . ACCU-CHEK FASTCLIX LANCETS MISC Check blood sugar once daily Dz:E11.9 100 each 3  . albuterol (PROAIR HFA) 108 (90 Base) MCG/ACT inhaler Inhale 2 puffs into the lungs every 6 (six) hours as needed for wheezing or shortness of breath. 18 g 5  . aspirin 81 MG tablet Take 81 mg by mouth daily.     . B-D ULTRAFINE III SHORT PEN 31G  X 8 MM MISC INJECT ONCE DAILY 100 each 3  . Blood Glucose Monitoring Suppl (ACCU-CHEK NANO SMARTVIEW) w/Device KIT Check blood sugar once daily 1 kit 0  . Calcium Carbonate (CALTRATE 600 PO) Take 600 mg by mouth 2 (two) times daily.     Marland Kitchen diltiazem (CARDIZEM CD) 240 MG 24 hr capsule Take 1 capsule (240 mg total) by mouth daily. 90 capsule 3  . FARXIGA 10 MG TABS tablet TAKE 1 TABLET EVERY DAY 90 tablet 3  . fenofibrate 160 MG tablet TAKE 1 TABLET BY MOUTH EVERY DAY 90 tablet 0  . Fluticasone-Salmeterol (ADVAIR DISKUS) 250-50 MCG/DOSE AEPB INHALE 1 PUFF INTO LUNGS 2 TIMES DAILY (Patient taking differently: Inhale 1 puff into the lungs 2 (two) times daily as needed (wheezing). INHALE 1 PUFF INTO LUNGS 2 TIMES DAILY) 3 each 3  . Glucosamine-Chondroitin (OSTEO BI-FLEX REGULAR STRENGTH) 250-200 MG TABS Take 1 tablet by mouth 2 (two) times daily.     Marland Kitchen glucose blood (ACCU-CHEK SMARTVIEW) test strip Check blood sugars once daily 100 each 12  . Krill Oil 350 MG CAPS Take 350 mg by mouth daily.     . Magnesium 250 MG TABS Take 250-500 mg by mouth See admin instructions. Take 288m by mouth Sunday, Tuesday, Thursday and Saturday and 5041mMonday, Wednesday and Friday    . metFORMIN (GLUCOPHAGE-XR) 500 MG 24 hr tablet TAKE 2 TABLETS EVERY DAY (Patient taking differently: TAKE 500MG BY MOUTH TWICE DAILY) 180 tablet 3  . metoprolol tartrate (LOPRESSOR) 25 MG tablet Take 1 tablet (25 mg  total) by mouth 2 (two) times daily. 180 tablet 3  . niacin (NIASPAN) 500 MG CR tablet 1 tab po qhs with lowfat snack and Aspirin 1/2 hour prior  Failed all statins and welchol 30 tablet 5  . nitroGLYCERIN (NITROSTAT) 0.4 MG SL tablet Place 1 tablet (0.4 mg total) under the tongue every 5 (five) minutes as needed for chest pain. 50 tablet 3  . pantoprazole (PROTONIX) 40 MG tablet TAKE 1 TABLET BY MOUTH EVERY DAY 90 tablet 1  . pioglitazone (ACTOS) 30 MG tablet TAKE 1 TABLET (30 MG TOTAL) BY MOUTH DAILY. 90 tablet 3  . POTASSIUM CITRATE PO Take 1 tablet by mouth daily.    . Probiotic Product (PROBIOTIC DAILY PO) Take 99 mg by mouth daily.     . repaglinide (PRANDIN) 0.5 MG tablet TAKE 1 TABLET (0.5 MG TOTAL) BY MOUTH DAILY WITH SUPPER. 90 tablet 0  . ticagrelor (BRILINTA) 90 MG TABS tablet Take 1 tablet (90 mg total) by mouth 2 (two) times daily. 180 tablet 3  . ticagrelor (BRILINTA) 90 MG TABS tablet Take 1 tablet (90 mg total) by mouth 2 (two) times daily. 60 tablet 0  . triamterene-hydrochlorothiazide (MAXZIDE-25) 37.5-25 MG tablet TAKE 1/2 TABLET BY MOUTH EVERY DAY 45 tablet 3  . VICTOZA 18 MG/3ML SOPN INJECT 1.8MG(0.3ML) DAILY FOR 90 DAYS 9 mL 3  . levothyroxine (SYNTHROID, LEVOTHROID) 88 MCG tablet TAKE 1 TABLET BY MOUTH EVERY DAY 90 tablet 1   No facility-administered medications prior to visit.     Allergies  Allergen Reactions  . Epinephrine Other (See Comments)    Pass out   . Statins Other (See Comments)    Myalgias: Simvastatin also caused back ache  . Welchol [Colesevelam Hcl]     myalgia  . Amoxicillin Itching and Rash    Has patient had a PCN reaction causing immediate rash, facial/tongue/throat swelling, SOB or lightheadedness with hypotension: No Has patient  had a PCN reaction causing severe rash involving mucus membranes or skin necrosis: No Has patient had a PCN reaction that required hospitalization: No Has patient had a PCN reaction occurring within the last 10  years: Yes If all of the above answers are "NO", then may proceed with Cephalosporin use.     Review of Systems  Constitutional: Negative for chills, fever and malaise/fatigue.  HENT: Negative for congestion and hearing loss.   Eyes: Negative for discharge.  Respiratory: Negative for cough, sputum production and shortness of breath.   Cardiovascular: Negative for chest pain, palpitations and leg swelling.  Gastrointestinal: Negative for abdominal pain, blood in stool, constipation, diarrhea, heartburn, nausea and vomiting.  Genitourinary: Negative for dysuria, frequency, hematuria and urgency.  Musculoskeletal: Positive for myalgias. Negative for back pain and falls.  Skin: Negative for rash.  Neurological: Negative for dizziness, sensory change, loss of consciousness, weakness and headaches.  Endo/Heme/Allergies: Negative for environmental allergies. Does not bruise/bleed easily.  Psychiatric/Behavioral: Negative for depression and suicidal ideas. The patient is not nervous/anxious and does not have insomnia.        Objective:    Physical Exam  BP (!) 112/58 (BP Location: Left Arm, Patient Position: Sitting, Cuff Size: Normal)   Pulse 97   Temp 97.9 F (36.6 C) (Oral)   Resp 18   Wt 169 lb 3.2 oz (76.7 kg)   SpO2 99%   BMI 27.31 kg/m  Wt Readings from Last 3 Encounters:  11/10/17 169 lb 3.2 oz (76.7 kg)  11/05/17 167 lb 8.8 oz (76 kg)  10/29/17 171 lb 12.8 oz (77.9 kg)   BP Readings from Last 3 Encounters:  11/10/17 (!) 112/58  11/05/17 (!) 131/59  10/31/17 111/64     Immunization History  Administered Date(s) Administered  . Influenza Split 03/27/2011, 04/08/2012  . Influenza Whole 03/14/2009, 02/06/2010  . Influenza,inj,Quad PF,6+ Mos 01/27/2013, 05/02/2014, 04/12/2017  . Pneumococcal Polysaccharide-23 03/28/2008  . Tdap 09/18/2011  . Zoster 04/15/2016    Health Maintenance  Topic Date Due  . HIV Screening  09/28/1969  . PNEUMOCOCCAL POLYSACCHARIDE VACCINE  (2) 03/28/2013  . MAMMOGRAM  07/04/2017  . COLONOSCOPY  09/13/2017  . URINE MICROALBUMIN  10/22/2017  . INFLUENZA VACCINE  12/25/2017  . HEMOGLOBIN A1C  01/25/2018  . OPHTHALMOLOGY EXAM  04/03/2018  . FOOT EXAM  07/26/2018  . PAP SMEAR  04/16/2019  . TETANUS/TDAP  09/17/2021  . Hepatitis C Screening  Completed    Lab Results  Component Value Date   WBC 8.9 11/10/2017   HGB 11.2 (L) 11/10/2017   HCT 34.3 (L) 11/10/2017   PLT 369.0 11/10/2017   GLUCOSE 148 (H) 11/10/2017   CHOL 220 (H) 08/25/2017   TRIG 124.0 08/25/2017   HDL 50.20 08/25/2017   LDLDIRECT 151.0 06/01/2015   LDLCALC 145 (H) 08/25/2017   ALT 15 11/10/2017   AST 16 11/10/2017   NA 138 11/10/2017   K 3.9 11/10/2017   CL 101 11/10/2017   CREATININE 1.24 (H) 11/10/2017   BUN 26 (H) 11/10/2017   CO2 25 11/10/2017   TSH 4.960 (H) 11/03/2017   INR 1.07 11/20/2011   HGBA1C 6.6 07/25/2017   MICROALBUR <0.7 10/22/2016    Lab Results  Component Value Date   TSH 4.960 (H) 11/03/2017   Lab Results  Component Value Date   WBC 8.9 11/10/2017   HGB 11.2 (L) 11/10/2017   HCT 34.3 (L) 11/10/2017   MCV 77.4 (L) 11/10/2017   PLT 369.0 11/10/2017   Lab  Results  Component Value Date   NA 138 11/10/2017   K 3.9 11/10/2017   CO2 25 11/10/2017   GLUCOSE 148 (H) 11/10/2017   BUN 26 (H) 11/10/2017   CREATININE 1.24 (H) 11/10/2017   BILITOT 0.4 11/10/2017   ALKPHOS 47 11/10/2017   AST 16 11/10/2017   ALT 15 11/10/2017   PROT 7.3 11/10/2017   ALBUMIN 4.4 11/10/2017   CALCIUM 10.1 11/10/2017   ANIONGAP 9 11/05/2017   GFR 46.42 (L) 11/10/2017   Lab Results  Component Value Date   CHOL 220 (H) 08/25/2017   Lab Results  Component Value Date   HDL 50.20 08/25/2017   Lab Results  Component Value Date   LDLCALC 145 (H) 08/25/2017   Lab Results  Component Value Date   TRIG 124.0 08/25/2017   Lab Results  Component Value Date   CHOLHDL 4 08/25/2017   Lab Results  Component Value Date   HGBA1C 6.6  07/25/2017         Assessment & Plan:   Problem List Items Addressed This Visit    Hypothyroidism    TSH had increased to above 4 will increase Levothyroxine 88 mcg tabs to 1 tab po daily x 5 days a week and 2 tabs on Tues and Sat recheck in 8 weeks.      Relevant Medications   levothyroxine (SYNTHROID, LEVOTHROID) 88 MCG tablet   Hypertension    Well controlled, no changes to meds. Encouraged heart healthy diet such as the DASH diet and exercise as tolerated.       DM (diabetes mellitus), type 2 (HCC)    hgba1c acceptable, minimize simple carbs. Increase exercise as tolerated. Continue current meds      Anemia   Relevant Orders   CBC (Completed)   Ferritin (Completed)   IBC panel (Completed)   Coronary artery disease due to calcified coronary lesion    S/p       Pure hypercholesterolemia    Tolerating statin, encouraged heart healthy diet, avoid trans fats, minimize simple carbs and saturated fats. Increase exercise as tolerated      Chronic pain of right upper extremity - Primary    Under       Relevant Orders   Comprehensive metabolic panel (Completed)    Other Visit Diagnoses    Pain and swelling of right upper extremity       Relevant Orders   US Venous Img Upper Uni Right   VAS [place] UPPER EXTREMITY ARTERIAL DUPLEX      I have changed Pamalee Leyden. Winfree's levothyroxine. I am also having her maintain her aspirin, Glucosamine-Chondroitin, Fluticasone-Salmeterol, niacin, VICTOZA, pioglitazone, ACCU-CHEK NANO SMARTVIEW, pantoprazole, B-D ULTRAFINE III SHORT PEN, ACCU-CHEK FASTCLIX LANCETS, glucose blood, metFORMIN, repaglinide, nitroGLYCERIN, Magnesium, POTASSIUM CITRATE PO, Calcium Carbonate (CALTRATE 600 PO), Krill Oil, diltiazem, triamterene-hydrochlorothiazide, fenofibrate, albuterol, FARXIGA, Probiotic Product (PROBIOTIC DAILY PO), metoprolol tartrate, ticagrelor, and ticagrelor.  Meds ordered this encounter  Medications  . levothyroxine (SYNTHROID,  LEVOTHROID) 88 MCG tablet    Sig: 1 tab po daily except on Tues and Sat take 2 tabs    Dispense:  90 tablet    Refill:  0    CMA served as scribe during this visit. History, Physical and Plan performed by medical provider. Documentation and orders reviewed and attested to.  Penni Homans, MD

## 2017-11-11 ENCOUNTER — Ambulatory Visit (HOSPITAL_BASED_OUTPATIENT_CLINIC_OR_DEPARTMENT_OTHER)
Admission: RE | Admit: 2017-11-11 | Discharge: 2017-11-11 | Disposition: A | Discharge status: Home / Self Care | Payer: BLUE CROSS/BLUE SHIELD | Source: Ambulatory Visit | Attending: Family Medicine | Admitting: Family Medicine

## 2017-11-11 ENCOUNTER — Encounter (HOSPITAL_COMMUNITY): Payer: Self-pay | Admitting: Cardiology

## 2017-11-11 DIAGNOSIS — M79601 Pain in right arm: Secondary | ICD-10-CM

## 2017-11-11 DIAGNOSIS — M7989 Other specified soft tissue disorders: Secondary | ICD-10-CM

## 2017-11-11 NOTE — Progress Notes (Signed)
Patient arrived this morning for an arterial study which was scheduled tomorrow.  She has an appointment for a venous study at [facility] this afternoon.  I explained to her that insurance is likely to deny payment on one of the tests if performed the same day.  She wishes to proceed having the arterial study done this morning, and expressed that she would "fight it" because she needs to care for herself.

## 2017-11-12 ENCOUNTER — Encounter: Payer: Self-pay | Admitting: Cardiology

## 2017-11-12 ENCOUNTER — Other Ambulatory Visit (HOSPITAL_COMMUNITY): Payer: BLUE CROSS/BLUE SHIELD

## 2017-11-12 DIAGNOSIS — I2510 Atherosclerotic heart disease of native coronary artery without angina pectoris: Secondary | ICD-10-CM | POA: Insufficient documentation

## 2017-11-12 DIAGNOSIS — Z9861 Coronary angioplasty status: Secondary | ICD-10-CM

## 2017-11-12 NOTE — Addendum Note (Signed)
Addended by: Bartholome Bill on: 11/12/2017 08:58 AM   Modules accepted: Orders

## 2017-11-14 ENCOUNTER — Encounter: Payer: Self-pay | Admitting: Cardiology

## 2017-11-14 ENCOUNTER — Ambulatory Visit: Payer: BLUE CROSS/BLUE SHIELD | Admitting: Cardiology

## 2017-11-14 DIAGNOSIS — E119 Type 2 diabetes mellitus without complications: Secondary | ICD-10-CM | POA: Diagnosis not present

## 2017-11-14 DIAGNOSIS — E785 Hyperlipidemia, unspecified: Secondary | ICD-10-CM

## 2017-11-14 DIAGNOSIS — I2510 Atherosclerotic heart disease of native coronary artery without angina pectoris: Secondary | ICD-10-CM | POA: Diagnosis not present

## 2017-11-14 DIAGNOSIS — I10 Essential (primary) hypertension: Secondary | ICD-10-CM

## 2017-11-14 DIAGNOSIS — Z9861 Coronary angioplasty status: Secondary | ICD-10-CM | POA: Diagnosis not present

## 2017-11-14 NOTE — Progress Notes (Signed)
11/14/2017 [name] [name]   Sep 11, 1955  [id number]  Primary Physician Mosie Lukes, MD Primary Cardiologist: Dr Percival Spanish  HPI:  Pleasant 62 y/o female with a history of HTN, asthma, HLD (statin intolerant), and NIDDM. She developed dyspnea in the mornings after showering. She initially thought this was her asthma but inhaler didn't help. She saw Dr Earnest Bailey who ordered an echo which showed normal LVF. A coronary CTA was abnormal showing LAD disease. Cath done 11/04/17 showed 60% LAD, FFR was significant and she underwent PCI with DES. She had mild scattered disease elsewhere. She tolerated this well and is in the office today for follow up. Since discharge she says she has felt much better, no unusual dyspnea, no problems with her medications. She did have significant bruising in her right radial site and forearm, and also swelling in her right bicep area. Venous doppler was negative for DVT and this has continued to improve.    Current Outpatient Medications  Medication Sig Dispense Refill  . ACCU-CHEK FASTCLIX LANCETS MISC Check blood sugar once daily Dz:E11.9 100 each 3  . albuterol (PROAIR HFA) 108 (90 Base) MCG/ACT inhaler Inhale 2 puffs into the lungs every 6 (six) hours as needed for wheezing or shortness of breath. 18 g 5  . aspirin 81 MG tablet Take 81 mg by mouth daily.     . B-D ULTRAFINE III SHORT PEN 31G X 8 MM MISC INJECT ONCE DAILY 100 each 3  . Blood Glucose Monitoring Suppl (ACCU-CHEK NANO SMARTVIEW) w/Device KIT Check blood sugar once daily 1 kit 0  . Calcium Carbonate (CALTRATE 600 PO) Take 600 mg by mouth 2 (two) times daily.     Marland Kitchen diltiazem (CARDIZEM CD) 240 MG 24 hr capsule Take 1 capsule (240 mg total) by mouth daily. 90 capsule 3  . FARXIGA 10 MG TABS tablet TAKE 1 TABLET EVERY DAY 90 tablet 3  . fenofibrate 160 MG tablet TAKE 1 TABLET BY MOUTH EVERY DAY 90 tablet 0  . Fluticasone-Salmeterol (ADVAIR DISKUS) 250-50 MCG/DOSE AEPB INHALE 1 PUFF INTO LUNGS 2 TIMES DAILY  (Patient taking differently: Inhale 1 puff into the lungs 2 (two) times daily as needed (wheezing). INHALE 1 PUFF INTO LUNGS 2 TIMES DAILY) 3 each 3  . Glucosamine-Chondroitin (OSTEO BI-FLEX REGULAR STRENGTH) 250-200 MG TABS Take 1 tablet by mouth 2 (two) times daily.     Marland Kitchen glucose blood (ACCU-CHEK SMARTVIEW) test strip Check blood sugars once daily 100 each 12  . Krill Oil 350 MG CAPS Take 350 mg by mouth daily.     Marland Kitchen levothyroxine (SYNTHROID, LEVOTHROID) 88 MCG tablet 1 tab po daily except on Tues and Sat take 2 tabs 90 tablet 0  . Magnesium 250 MG TABS Take 250-500 mg by mouth See admin instructions. Take 275m by mouth Sunday, Tuesday, Thursday and Saturday and 5070mMonday, Wednesday and Friday    . metFORMIN (GLUCOPHAGE-XR) 500 MG 24 hr tablet TAKE 2 TABLETS EVERY DAY (Patient taking differently: TAKE 500MG BY MOUTH TWICE DAILY) 180 tablet 3  . metoprolol tartrate (LOPRESSOR) 25 MG tablet Take 1 tablet (25 mg total) by mouth 2 (two) times daily. 180 tablet 3  . niacin (NIASPAN) 500 MG CR tablet 1 tab po qhs with lowfat snack and Aspirin 1/2 hour prior  Failed all statins and welchol 30 tablet 5  . nitroGLYCERIN (NITROSTAT) 0.4 MG SL tablet Place 1 tablet (0.4 mg total) under the tongue every 5 (five) minutes as needed for chest pain. [place]  tablet 3  . pantoprazole (PROTONIX) 40 MG tablet TAKE 1 TABLET BY MOUTH EVERY DAY 90 tablet 1  . pioglitazone (ACTOS) 30 MG tablet TAKE 1 TABLET (30 MG TOTAL) BY MOUTH DAILY. 90 tablet 3  . POTASSIUM CITRATE PO Take 1 tablet by mouth daily.    . Probiotic Product (PROBIOTIC DAILY PO) Take 99 mg by mouth daily.     . repaglinide (PRANDIN) 0.5 MG tablet TAKE 1 TABLET (0.5 MG TOTAL) BY MOUTH DAILY WITH SUPPER. 90 tablet 0  . ticagrelor (BRILINTA) 90 MG TABS tablet Take 1 tablet (90 mg total) by mouth 2 (two) times daily. 180 tablet 3  . ticagrelor (BRILINTA) 90 MG TABS tablet Take 1 tablet (90 mg total) by mouth 2 (two) times daily. 60 tablet 0  .  triamterene-hydrochlorothiazide (MAXZIDE-25) 37.5-25 MG tablet TAKE 1/2 TABLET BY MOUTH EVERY DAY 45 tablet 3  . VICTOZA 18 MG/3ML SOPN INJECT 1.8MG(0.3ML) DAILY FOR 90 DAYS 9 mL 3   No current facility-administered medications for this visit.     Allergies  Allergen Reactions  . Epinephrine Other (See Comments)    Pass out   . Statins Other (See Comments)    Myalgias: Simvastatin also caused back ache  . Welchol [Colesevelam Hcl]     myalgia  . Amoxicillin Itching and Rash    Has patient had a PCN reaction causing immediate rash, facial/tongue/throat swelling, SOB or lightheadedness with hypotension: No Has patient had a PCN reaction causing severe rash involving mucus membranes or skin necrosis: No Has patient had a PCN reaction that required hospitalization: No Has patient had a PCN reaction occurring within the last 10 years: Yes If all of the above answers are "NO", then may proceed with Cephalosporin use.     Past Medical History:  Diagnosis Date  . Abdominal pain 08/10/2014  . Allergic rhinitis 08/24/2010  . Anterior neck pain 08/10/2014  . Asthma   . Asthma with acute exacerbation 04/27/2011  . Atypical chest pain    normal coronaries and LV function by cath 10/2011  . BACK PAIN 03/01/2009   Qualifier: Diagnosis of  By: Wynona Luna   . Cervical cancer screening 10/27/2012   Menarche at 5, regular S/p partial hysterectomy at 26 for precancer changes persistent despite cryo. Both ovaries left in place. No HRT Menopause early 2s G1P1, s/p 1 svd s/p episiomtomy and tear with stitches MGM up to date, h/o calcium deposits repeat MGMs stable   . Chest pain 11/20/2011  . Chronic pain disorder    neck and shoulder  . CTS (carpal tunnel syndrome) 01/27/2013   right  . Diabetes mellitus type II   . Diabetic gastroparesis ([place])   . Esophageal reflux 08/25/2013   [place], Dr Barth Kirks  . Fatty liver    abnormal transaminases; negative work up  . FATTY LIVER DISEASE  04/27/2007   Qualifier: Diagnosis of  By: Danelle Earthly CMA, Darlene    . Focal muscle atrophy 09/26/2015  . Fundic gland polyps of stomach, benign   . GERD (gastroesophageal reflux disease)   . Hip pain, bilateral 07/19/2012  . HTN (hypertension)   . Hyperlipidemia, mixed 05/04/2007   Qualifier: Diagnosis of  By: Wynona Luna crestor caused myalgias even at low dose Livalo caused myalgias Lipitor  Mother with severe reaction myalgias Simvastatin, Welchol   . Hypertension 08/24/2010  . Hypokalemia 05/25/2013   Improved stopping HCTZ. Was noted to have an elevated glucose when K was low. Recheck  renal next week after starting Maxzide  . Hypomagnesemia 08/10/2014  . Hypothyroidism   . Laryngitis 08/25/2017  . Leg cramps 04/15/2016  . Nausea without vomiting 08/08/2014  . NONSPEC ELEVATION OF LEVELS OF TRANSAMINASE/LDH 05/01/2007   Qualifier: Diagnosis of  By: Garner Gavel    . Obesity 10/22/2016  . Osteoarthritis    chronic, right knee (11/04/2017)  . Other malaise and fatigue 01/27/2013  . OVARIAN CYST 07/11/2009   Qualifier: Diagnosis of  By: Wynona Luna   . Pain of both thighs 08/08/2014  . Plantar fasciitis   . Pneumonia    "twice" (11/04/2017)  . Preventative health care 04/15/2016  . Rosacea 10/22/2016    Social History   Socioeconomic History  . Marital status: Married    Spouse name: Not on file  . Number of children: 1  . Years of education: Not on file  . Highest education level: Not on file  Occupational History  . Occupation: IT TECH, admin. assistant    Employer: [employer]  Social Needs  . Financial resource strain: Not on file  . Food insecurity:    Worry: Not on file    Inability: Not on file  . Transportation needs:    Medical: Not on file    Non-medical: Not on file  Tobacco Use  . Smoking status: Never Smoker  . Smokeless tobacco: Never Used  Substance and Sexual Activity  . Alcohol use: Not Currently  . Drug use: Never  . Sexual activity: Not Currently    Lifestyle  . Physical activity:    Days per week: Not on file    Minutes per session: Not on file  . Stress: Not on file  Relationships  . Social connections:    Talks on phone: Not on file    Gets together: Not on file    Attends religious service: Not on file    Active member of club or organization: Not on file    Attends meetings of clubs or organizations: Not on file    Relationship status: Not on file  . Intimate partner violence:    Fear of current or ex partner: Not on file    Emotionally abused: Not on file    Physically abused: Not on file    Forced sexual activity: Not on file  Other Topics Concern  . Not on file  Social History Narrative   Multiple family members with intolerance to statins.     Family History  Problem Relation Age of Onset  . Alzheimer's disease Mother   . Diabetes type II Mother   . Hiatal hernia Mother   . Diabetes Mother   . Emphysema Father   . COPD Father   . Depression Sister        suicide  . Diabetes Sister   . Diabetes Daughter   . Colon cancer Neg Hx   . Stomach cancer Neg Hx      Review of Systems: General: negative for chills, fever, night sweats or weight changes.  Cardiovascular: negative for chest pain, dyspnea on exertion, edema, orthopnea, palpitations, paroxysmal nocturnal dyspnea or shortness of breath Dermatological: negative for rash Respiratory: negative for cough or wheezing Urologic: negative for hematuria Abdominal: negative for nausea, vomiting, diarrhea, bright red blood per rectum, melena, or hematemesis Neurologic: negative for visual changes, syncope, or dizziness All other systems reviewed and are otherwise negative except as noted above.    Blood pressure 110/62, pulse 78, height _0  (1.676 m), weight  169 lb (76.7 kg).  General appearance: alert, cooperative and no distress Neck: no carotid bruit and no JVD Lungs: clear to auscultation bilaterally Heart: regular rate and rhythm Extremities: rt  radial site ecchymotic Skin: Skin color, texture, turgor normal. No rashes or lesions Neurologic: Grossly normal  EKG NSR  ASSESSMENT AND PLAN:   CAD S/P percutaneous coronary angioplasty Cath 2013-normal coronaries Cath 11/04/17- 60% pLAD with positive FFR- treated with PCI and DES  Non-insulin dependent type 2 diabetes mellitus ([place]) Followed by Dr Loanne Drilling- Consider adding Jardiance   Hypertension Controlled  Hyperlipidemia LDL goal <70 Stain tintolerant Referred to lipid clinic for [facility] after she had PCI June 2019  crestor caused myalgias even at low dose Livalo caused myalgias Lipitor - Mother with severe reaction myalgias   PLAN  The pt tells me she has transferred her care to Dr Percival Spanish and I'll arrange for an office visit in 3 months. . She has an appointment with the lipid clinic next month. Dr Loanne Drilling follows her for DM and I asked her to discuss Jardiance with him when she sees him next month.   Kerin Ransom PA-C 11/14/2017 8:42 AM

## 2017-11-14 NOTE — Assessment & Plan Note (Signed)
Followed by Dr Loanne Drilling- Consider adding Vania Rea

## 2017-11-14 NOTE — Assessment & Plan Note (Signed)
Cath 2013-normal coronaries Cath 11/04/17- 60% pLAD with positive FFR- treated with PCI and DES

## 2017-11-14 NOTE — Assessment & Plan Note (Signed)
Stain tintolerant Referred to lipid clinic for [facility] after she had PCI June 2019  crestor caused myalgias even at low dose Livalo caused myalgias Lipitor - Mother with severe reaction myalgias

## 2017-11-14 NOTE — Assessment & Plan Note (Signed)
Controlled.  

## 2017-11-14 NOTE — Patient Instructions (Signed)
Medication Instructions: Your physician recommends that you continue on your current medications as directed. Please refer to the Current Medication list given to you today.  If you need a refill on your cardiac medications before your next appointment, please call your pharmacy.   Labwork: None  Procedures/Testing: None  Follow-Up: Your physician wants you to follow-up in 3 months with Dr. Percival Spanish.  Thank you for choosing [facility]!!

## 2017-11-15 ENCOUNTER — Other Ambulatory Visit: Payer: Self-pay | Admitting: Endocrinology

## 2017-11-17 ENCOUNTER — Ambulatory Visit (INDEPENDENT_AMBULATORY_CARE_PROVIDER_SITE_OTHER): Payer: BLUE CROSS/BLUE SHIELD | Admitting: Pulmonary Disease

## 2017-11-17 ENCOUNTER — Ambulatory Visit: Payer: BLUE CROSS/BLUE SHIELD | Admitting: Pulmonary Disease

## 2017-11-17 ENCOUNTER — Encounter: Payer: Self-pay | Admitting: Pulmonary Disease

## 2017-11-17 ENCOUNTER — Telehealth (HOSPITAL_COMMUNITY): Payer: Self-pay

## 2017-11-17 DIAGNOSIS — R0602 Shortness of breath: Secondary | ICD-10-CM | POA: Diagnosis not present

## 2017-11-17 LAB — PULMONARY FUNCTION TEST
DL/VA % pred: 86 %
DL/VA: 4.3 ml/min/mmHg/L
DLCO COR % PRED: 64 %
DLCO cor: 17 ml/min/mmHg
DLCO unc % pred: 59 %
DLCO unc: 15.73 ml/min/mmHg
FEF 25-75 POST: 3.38 L/s
FEF 25-75 Pre: 3.33 L/sec
FEF2575-%Change-Post: 1 %
FEF2575-%Pred-Post: 147 %
FEF2575-%Pred-Pre: 144 %
FEV1-%Change-Post: 0 %
FEV1-%Pred-Post: 94 %
FEV1-%Pred-Pre: 94 %
FEV1-POST: 2.46 L
FEV1-Pre: 2.46 L
FEV1FVC-%Change-Post: 0 %
FEV1FVC-%Pred-Pre: 114 %
FEV6-%Change-Post: 0 %
FEV6-%PRED-PRE: 84 %
FEV6-%Pred-Post: 84 %
FEV6-Post: 2.76 L
FEV6-Pre: 2.78 L
FEV6FVC-%Pred-Post: 104 %
FEV6FVC-%Pred-Pre: 104 %
FVC-%CHANGE-POST: 0 %
FVC-%PRED-POST: 81 %
FVC-%PRED-PRE: 81 %
FVC-POST: 2.76 L
FVC-PRE: 2.78 L
POST FEV1/FVC RATIO: 89 %
POST FEV6/FVC RATIO: 100 %
PRE FEV1/FVC RATIO: 88 %
PRE FEV6/FVC RATIO: 100 %
RV % PRED: 71 %
RV: 1.53 L
TLC % PRED: 92 %
TLC: 4.9 L

## 2017-11-17 NOTE — Progress Notes (Signed)
Inhaler training given. In-check peak flow #:55

## 2017-11-17 NOTE — Progress Notes (Addendum)
[name] [name]    [id number]    1955/11/29  Primary Care Physician:Blyth, Bonnita Levan, MD  Referring Physician: Mosie Lukes, MD [address] [place], [place] [address]  Chief complaint: Follow-up for dyspnea  HPI: 62 year old with past medical history of asthma, hypertension, diabetes, allergies Complains of of dyspnea on exertion for the since fall 2019.  She describes worsening dyspnea with minimal activity associated with central chest tightness, no radiation and chest pain.  Symptoms do not occur at rest.  She denies any cough, sputum production, fevers, chills.  She has been diagnosed with asthma 7 years ago and is on albuterol rescue inhaler.  She has been using the inhaler several times a day recently with no relief in symptoms  She has been evaluated by Dr. Bettina Gavia, Cardiology with EKG showing nonspecific T wave changes.  CT coronaries are planned for further evaluation of coronary artery disease.  Also placed on calcium channel blocker which has improved her chest pain. Reports sensitivity to flowers, strong aromas, [place] cats, dogs.  She also has seasonal allergies.  She has history of irritable bowel syndrome, diabetic gastroparesis and acid reflux and is on Protonix 40 mg/day.  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  No mold, dampness, Jacuzzis, hot tub Smoking history: Never smoker Travel history: She grew up in [place].  Lived in [place] since 1978.  No recent travel. Relevant family history: Not significant  Interim history: Tried on Breo at last visit.  She had to stop it after few days due to problems with hoarseness, difficulty clearing throat. She had been evaluated by cardiology for coronary artery disease and had stents placed in the LAD  Returns to clinic for review of PFTs.  States that her dyspnea is improved.  She still has mild symptoms on exertion.  Denies any cough,  sputum production, wheezing She is back to work on his also trying to increase her exercise at home.  Outpatient Encounter Medications as of 11/17/2017  Medication Sig  . ACCU-CHEK FASTCLIX LANCETS MISC Check blood sugar once daily Dz:E11.9  . albuterol (PROAIR HFA) 108 (90 Base) MCG/ACT inhaler Inhale 2 puffs into the lungs every 6 (six) hours as needed for wheezing or shortness of breath.  Marland Kitchen aspirin 81 MG tablet Take 81 mg by mouth daily.   . B-D ULTRAFINE III SHORT PEN 31G X 8 MM MISC INJECT ONCE DAILY  . Blood Glucose Monitoring Suppl (ACCU-CHEK NANO SMARTVIEW) w/Device KIT Check blood sugar once daily  . Calcium Carbonate (CALTRATE 600 PO) Take 600 mg by mouth 2 (two) times daily.   Marland Kitchen diltiazem (CARDIZEM CD) 240 MG 24 hr capsule Take 1 capsule (240 mg total) by mouth daily.  Marland Kitchen FARXIGA 10 MG TABS tablet TAKE 1 TABLET EVERY DAY  . fenofibrate 160 MG tablet TAKE 1 TABLET BY MOUTH EVERY DAY  . Fluticasone-Salmeterol (ADVAIR DISKUS) 250-50 MCG/DOSE AEPB INHALE 1 PUFF INTO LUNGS 2 TIMES DAILY (Patient taking differently: Inhale 1 puff into the lungs 2 (two) times daily as needed (wheezing). INHALE 1 PUFF INTO LUNGS 2 TIMES DAILY)  . Glucosamine-Chondroitin (OSTEO BI-FLEX REGULAR STRENGTH) 250-200 MG TABS Take 1 tablet by mouth 2 (two) times daily.   Marland Kitchen glucose blood (ACCU-CHEK SMARTVIEW) test strip Check blood sugars once daily  . Krill Oil 350 MG CAPS Take 350 mg by mouth daily.   Marland Kitchen levothyroxine (SYNTHROID, LEVOTHROID) 88 MCG tablet  1 tab po daily except on Tues and Sat take 2 tabs  . Magnesium 250 MG TABS Take 250-500 mg by mouth See admin instructions. Take 253m by mouth Sunday, Tuesday, Thursday and Saturday and 5023mMonday, Wednesday and Friday  . metFORMIN (GLUCOPHAGE-XR) 500 MG 24 hr tablet TAKE 2 TABLETS EVERY DAY (Patient taking differently: TAKE 500MG BY MOUTH TWICE DAILY)  . metoprolol tartrate (LOPRESSOR) 25 MG tablet Take 1 tablet (25 mg total) by mouth 2 (two) times daily.  .  niacin (NIASPAN) 500 MG CR tablet 1 tab po qhs with lowfat snack and Aspirin 1/2 hour prior  Failed all statins and welchol  . nitroGLYCERIN (NITROSTAT) 0.4 MG SL tablet Place 1 tablet (0.4 mg total) under the tongue every 5 (five) minutes as needed for chest pain.  . pantoprazole (PROTONIX) 40 MG tablet TAKE 1 TABLET BY MOUTH EVERY DAY  . pioglitazone (ACTOS) 30 MG tablet TAKE 1 TABLET (30 MG TOTAL) BY MOUTH DAILY.  . Marland KitchenOTASSIUM CITRATE PO Take 1 tablet by mouth daily.  . Probiotic Product (PROBIOTIC DAILY PO) Take 99 mg by mouth daily.   . repaglinide (PRANDIN) 0.5 MG tablet TAKE 1 TABLET (0.5 MG TOTAL) BY MOUTH DAILY WITH SUPPER.  . ticagrelor (BRILINTA) 90 MG TABS tablet Take 1 tablet (90 mg total) by mouth 2 (two) times daily.  . ticagrelor (BRILINTA) 90 MG TABS tablet Take 1 tablet (90 mg total) by mouth 2 (two) times daily.  . Marland Kitchenriamterene-hydrochlorothiazide (MAXZIDE-25) 37.5-25 MG tablet TAKE 1/2 TABLET BY MOUTH EVERY DAY  . VICTOZA 18 MG/3ML SOPN INJECT 1.8MG(0.3ML) DAILY FOR 90 DAYS   No facility-administered encounter medications on file as of 11/17/2017.     Allergies as of 11/17/2017 - Review Complete 11/17/2017  Allergen Reaction Noted  . Epinephrine Other (See Comments)   . Statins Other (See Comments) 08/24/2013  . Welchol [colesevelam hcl]  08/24/2013  . Amoxicillin Itching and Rash 04/04/2015    Past Medical History:  Diagnosis Date  . Abdominal pain 08/10/2014  . Allergic rhinitis 08/24/2010  . Anterior neck pain 08/10/2014  . Asthma   . Asthma with acute exacerbation 04/27/2011  . Atypical chest pain    normal coronaries and LV function by cath 10/2011  . BACK PAIN 03/01/2009   Qualifier: Diagnosis of  By: YoWynona Luna . Cervical cancer screening 10/27/2012   Menarche at [age] S/p partial hysterectomy at 26 for precancer changes persistent despite cryo. Both ovaries left in place. No HRT Menopause early [id number], s/p 1 svd s/p episiomtomy and tear with  stitches MGM up to date, h/o calcium deposits repeat MGMs stable   . Chest pain 11/20/2011  . Chronic pain disorder    neck and shoulder  . CTS (carpal tunnel syndrome) 01/27/2013   right  . Diabetes mellitus type II   . Diabetic gastroparesis ([facility]  . Esophageal reflux 08/25/2013   [place] [facility]. Fatty liver    abnormal transaminases; negative work up  . FATTY LIVER DISEASE 04/27/2007   Qualifier: Diagnosis of  By: KnDanelle EarthlyMA, Darlene    . Focal muscle atrophy 09/26/2015  . Fundic gland polyps of stomach, benign   . GERD (gastroesophageal reflux disease)   . Hip pain, bilateral 07/19/2012  . HTN (hypertension)   . Hyperlipidemia, mixed 05/04/2007   Qualifier: Diagnosis of  By: YoWynona Lunarestor caused myalgias even at low dose Livalo caused myalgias Lipitor  Mother with severe  reaction myalgias Simvastatin, Welchol   . Hypertension 08/24/2010  . Hypokalemia 05/25/2013   Improved stopping HCTZ. Was noted to have an elevated glucose when K was low. Recheck renal next week after starting Maxzide  . Hypomagnesemia 08/10/2014  . Hypothyroidism   . Laryngitis 08/25/2017  . Leg cramps 04/15/2016  . Nausea without vomiting 08/08/2014  . NONSPEC ELEVATION OF LEVELS OF TRANSAMINASE/LDH 05/01/2007   Qualifier: Diagnosis of  By: Garner Gavel    . Obesity 10/22/2016  . Osteoarthritis    chronic, right knee (11/04/2017)  . Other malaise and fatigue 01/27/2013  . OVARIAN CYST 07/11/2009   Qualifier: Diagnosis of  By: Wynona Luna   . Pain of both thighs 08/08/2014  . Plantar fasciitis   . Pneumonia    "twice" (11/04/2017)  . Preventative health care 04/15/2016  . Rosacea 10/22/2016    Past Surgical History:  Procedure Laterality Date  . AUGMENTATION MAMMAPLASTY Bilateral 2002  . COLONOSCOPY    . CORONARY STENT INTERVENTION N/A 11/04/2017   Procedure: CORONARY STENT INTERVENTION;  Surgeon: Nelva Bush, MD;  Location: [facility];  Service: Cardiovascular;   Laterality: N/A;  . ESOPHAGOGASTRODUODENOSCOPY    . INTRAVASCULAR PRESSURE WIRE/FFR STUDY N/A 11/04/2017   Procedure: INTRAVASCULAR PRESSURE WIRE/FFR STUDY;  Surgeon: Nelva Bush, MD;  Location: [facility];  Service: Cardiovascular;  Laterality: N/A;  . INTRAVASCULAR ULTRASOUND/IVUS N/A 11/04/2017   Procedure: Intravascular Ultrasound/IVUS;  Surgeon: Nelva Bush, MD;  Location: [facility];  Service: Cardiovascular;  Laterality: N/A;  . KNEE ARTHROSCOPY  1998, [id number]  . LAPAROSCOPIC CHOLECYSTECTOMY    . LEFT HEART CATH AND CORONARY ANGIOGRAPHY N/A 11/04/2017   Procedure: LEFT HEART CATH AND CORONARY ANGIOGRAPHY;  Surgeon: Nelva Bush, MD;  Location: [facility];  Service: Cardiovascular;  Laterality: N/A;  . LEFT HEART CATHETERIZATION WITH CORONARY ANGIOGRAM N/A 11/21/2011   Procedure: LEFT HEART CATHETERIZATION WITH CORONARY ANGIOGRAM;  Surgeon: Josue Hector, MD;  Location: [facility];  Service: Cardiovascular;  Laterality: N/A;  . TONSILLECTOMY  1975  . VAGINAL HYSTERECTOMY  1983   "still have my ovaries"    Family History  Problem Relation Age of Onset  . Alzheimer's disease Mother   . Diabetes type II Mother   . Hiatal hernia Mother   . Diabetes Mother   . Emphysema Father   . COPD Father   . Depression Sister        suicide  . Diabetes Sister   . Diabetes Daughter   . Colon cancer Neg Hx   . Stomach cancer Neg Hx     Social History   Socioeconomic History  . Marital status: Married    Spouse name: Not on file  . Number of children: 1  . Years of education: Not on file  . Highest education level: Not on file  Occupational History  . Occupation: IT TECH, admin. assistant    Employer: [employer]  Social Needs  . Financial resource strain: Not on file  . Food insecurity:    Worry: Not on file    Inability: Not on file  . Transportation needs:    Medical: Not on file    Non-medical: Not on file  Tobacco Use  . Smoking  status: Never Smoker  . Smokeless tobacco: Never Used  Substance and Sexual Activity  . Alcohol use: Not Currently  . Drug use: Never  . Sexual activity: Not Currently  Lifestyle  . Physical activity:  Days per week: Not on file    Minutes per session: Not on file  . Stress: Not on file  Relationships  . Social connections:    Talks on phone: Not on file    Gets together: Not on file    Attends religious service: Not on file    Active member of club or organization: Not on file    Attends meetings of clubs or organizations: Not on file    Relationship status: Not on file  . Intimate partner violence:    Fear of current or ex partner: Not on file    Emotionally abused: Not on file    Physically abused: Not on file    Forced sexual activity: Not on file  Other Topics Concern  . Not on file  Social History Narrative   Multiple family members with intolerance to statins.    Review of systems: Review of Systems  Constitutional: Negative for fever and chills.  HENT: Negative.   Eyes: Negative for blurred vision.  Respiratory: as per HPI  Cardiovascular: Negative for chest pain and palpitations.  Gastrointestinal: Negative for vomiting, diarrhea, blood per rectum. Genitourinary: Negative for dysuria, urgency, frequency and hematuria.  Musculoskeletal: Negative for myalgias, back pain and joint pain.  Skin: Negative for itching and rash.  Neurological: Negative for dizziness, tremors, focal weakness, seizures and loss of consciousness.  Endo/Heme/Allergies: Negative for environmental allergies.  Psychiatric/Behavioral: Negative for depression, suicidal ideas and hallucinations.  All other systems reviewed and are negative.  Physical Exam: Blood pressure 104/60, pulse 81, height 5' 5.5" (1.664 m), weight 170 lb 3.2 oz (77.2 kg), SpO2 95 %. Gen:      No acute distress HEENT:  EOMI, sclera anicteric Neck:     No masses; no thyromegaly Lungs:    Clear to auscultation  bilaterally; normal respiratory effort CV:         Regular rate and rhythm; no murmurs Abd:      + bowel sounds; soft, non-tender; no palpable masses, no distension Ext:    No edema; adequate peripheral perfusion Skin:      Warm and dry; no rash Neuro: alert and oriented x 3 Psych: normal mood and affect  Data Reviewed: PFTs 11/17/2017 FVC 2.76 [81%], FEV1 2.46 [94%], F/F 89, TLC 92%, DLCO 59%, DLCO/VA 86% Mild reduction diffusion capacity that corrects for alveolar volume.  FENO 10/01/17- 28  Imaging CT abdomen 11/28/2014-visualized lung bases are clear Chest x-ray 08/25/2017-no active cardiopulmonary disease I have reviewed the images personally.  Labs CBC 10/01/2017-WBC 6.5, eos 1.8%, absolute eosinophil count 117 Blood allergy profile 10/01/2017-IgE 12, RAST panel negative  Assessment:  Evaluation for dyspnea on exertion Although she has an history of asthma her PFTs, FENO and blood tests do not show any objective evidence of this. We will stop the Breo inhaler as it was not effective and caused side effects.  She has albuterol inhaler which she will keep but has not needed it recently.  PFTs do not show any obstruction or bronchodilator response.  There is reduction in diffusion capacity that corrects for alveolar volume.  This can be from pulmonary venous congestion, there is no evidence of interstitial lung disease on recent chest x-ray and previous CT abdomen in 2016. Reduced ERV is due to effect of body habitus.  Advised her to work on exercise regimen and weight loss with diet.  Coronary artery disease Status post stent placement with improvement in dyspnea symptoms Follow-up with cardiology.  GERD Continues on Protonix  with good control of symptoms.  Plan/Recommendations: - Continue albuterol inhaler as needed.  No need for controller medication. - Return to pulmonary clinic as needed.   Marshell Garfinkel MD [facility] 11/17/2017, 10:01 AM  CC:  Mosie Lukes, MD

## 2017-11-17 NOTE — Patient Instructions (Signed)
I am glad you are doing well after your stent placement Your PFTs look okay with no clear evidence of asthma Keep the albuterol inhaler and use as needed.  I do not believe you will need to be on a regular inhaler medication Please work on increasing your exercise, weight loss with diet Follow-up as needed.  Please call if there is any worsening of your symptoms.

## 2017-11-17 NOTE — Progress Notes (Signed)
PFT completed today. 11/17/17

## 2017-11-17 NOTE — Telephone Encounter (Signed)
Called patient to see if she is interested in the Cardiac Rehab Program. Patient stated she is interested although she cannot participate due to her work schedule and our Rehab hours. Closed referral.

## 2017-11-24 ENCOUNTER — Ambulatory Visit: Payer: BLUE CROSS/BLUE SHIELD | Admitting: Endocrinology

## 2017-11-24 ENCOUNTER — Encounter: Payer: Self-pay | Admitting: Endocrinology

## 2017-11-24 VITALS — BP 118/56 | HR 87 | Wt 167.8 lb

## 2017-11-24 DIAGNOSIS — E1143 Type 2 diabetes mellitus with diabetic autonomic (poly)neuropathy: Secondary | ICD-10-CM

## 2017-11-24 LAB — POCT GLYCOSYLATED HEMOGLOBIN (HGB A1C): Hemoglobin A1C: 6.5 % — AB (ref 4.0–5.6)

## 2017-11-24 NOTE — Progress Notes (Signed)
Subjective:    Patient ID: AI SONNENFELD, female    DOB: September 08, 1955, 62 y.o.   MRN: [id number]  HPI Pt returns for f/u of diabetes mellitus: DM type: 2 Dx'ed: 5784 Complications: gastroparesis and renal insuff Therapy: 4 oral meds and victoza.  GDM: never DKA: never.  Severe hypoglycemia: never.  Pancreatitis: never.  Other: she has never been on insulin; diarrhea and renal insuff limit metformin dosage; she did not tolerate parlodel (vomiting).   Interval history: She does not miss med doses.  pt states she feels well in general.   Past Medical History:  Diagnosis Date  . Abdominal pain 08/10/2014  . Allergic rhinitis 08/24/2010  . Anterior neck pain 08/10/2014  . Asthma   . Asthma with acute exacerbation 04/27/2011  . Atypical chest pain    normal coronaries and LV function by cath 10/2011  . BACK PAIN 03/01/2009   Qualifier: Diagnosis of  By: Wynona Luna   . Cervical cancer screening 10/27/2012   Menarche at 34, regular S/p partial hysterectomy at 26 for precancer changes persistent despite cryo. Both ovaries left in place. No HRT Menopause early 4s G1P1, s/p 1 svd s/p episiomtomy and tear with stitches MGM up to date, h/o calcium deposits repeat MGMs stable   . Chest pain 11/20/2011  . Chronic pain disorder    neck and shoulder  . CTS (carpal tunnel syndrome) 01/27/2013   right  . Diabetes mellitus type II   . Diabetic gastroparesis ([place])   . Esophageal reflux 08/25/2013   [place], Dr Barth Kirks  . Fatty liver    abnormal transaminases; negative work up  . FATTY LIVER DISEASE 04/27/2007   Qualifier: Diagnosis of  By: Danelle Earthly CMA, Darlene    . Focal muscle atrophy 09/26/2015  . Fundic gland polyps of stomach, benign   . GERD (gastroesophageal reflux disease)   . Hip pain, bilateral 07/19/2012  . HTN (hypertension)   . Hyperlipidemia, mixed 05/04/2007   Qualifier: Diagnosis of  By: Wynona Luna crestor caused myalgias even at low dose Livalo caused myalgias  Lipitor  Mother with severe reaction myalgias Simvastatin, Welchol   . Hypertension 08/24/2010  . Hypokalemia 05/25/2013   Improved stopping HCTZ. Was noted to have an elevated glucose when K was low. Recheck renal next week after starting Maxzide  . Hypomagnesemia 08/10/2014  . Hypothyroidism   . Laryngitis 08/25/2017  . Leg cramps 04/15/2016  . Nausea without vomiting 08/08/2014  . NONSPEC ELEVATION OF LEVELS OF TRANSAMINASE/LDH 05/01/2007   Qualifier: Diagnosis of  By: Garner Gavel    . Obesity 10/22/2016  . Osteoarthritis    chronic, right knee (11/04/2017)  . Other malaise and fatigue 01/27/2013  . OVARIAN CYST 07/11/2009   Qualifier: Diagnosis of  By: Wynona Luna   . Pain of both thighs 08/08/2014  . Plantar fasciitis   . Pneumonia    "twice" (11/04/2017)  . Preventative health care 04/15/2016  . Rosacea 10/22/2016    Past Surgical History:  Procedure Laterality Date  . AUGMENTATION MAMMAPLASTY Bilateral 2002  . COLONOSCOPY    . CORONARY STENT INTERVENTION N/A 11/04/2017   Procedure: CORONARY STENT INTERVENTION;  Surgeon: Nelva Bush, MD;  Location: [facility];  Service: Cardiovascular;  Laterality: N/A;  . ESOPHAGOGASTRODUODENOSCOPY    . INTRAVASCULAR PRESSURE WIRE/FFR STUDY N/A 11/04/2017   Procedure: INTRAVASCULAR PRESSURE WIRE/FFR STUDY;  Surgeon: Nelva Bush, MD;  Location: [facility];  Service: Cardiovascular;  Laterality:  N/A;  . INTRAVASCULAR ULTRASOUND/IVUS N/A 11/04/2017   Procedure: Intravascular Ultrasound/IVUS;  Surgeon: Nelva Bush, MD;  Location: [facility];  Service: Cardiovascular;  Laterality: N/A;  . KNEE ARTHROSCOPY  1998, [id number]  . LAPAROSCOPIC CHOLECYSTECTOMY    . LEFT HEART CATH AND CORONARY ANGIOGRAPHY N/A 11/04/2017   Procedure: LEFT HEART CATH AND CORONARY ANGIOGRAPHY;  Surgeon: Nelva Bush, MD;  Location: [facility];  Service: Cardiovascular;  Laterality: N/A;  . LEFT HEART CATHETERIZATION WITH CORONARY  ANGIOGRAM N/A 11/21/2011   Procedure: LEFT HEART CATHETERIZATION WITH CORONARY ANGIOGRAM;  Surgeon: Josue Hector, MD;  Location: [facility];  Service: Cardiovascular;  Laterality: N/A;  . TONSILLECTOMY  1975  . VAGINAL HYSTERECTOMY  1983   "still have my ovaries"    Social History   Socioeconomic History  . Marital status: Married    Spouse name: Not on file  . Number of children: 1  . Years of education: Not on file  . Highest education level: Not on file  Occupational History  . Occupation: IT TECH, admin. assistant    Employer: [employer]  Social Needs  . Financial resource strain: Not on file  . Food insecurity:    Worry: Not on file    Inability: Not on file  . Transportation needs:    Medical: Not on file    Non-medical: Not on file  Tobacco Use  . Smoking status: Never Smoker  . Smokeless tobacco: Never Used  Substance and Sexual Activity  . Alcohol use: Not Currently  . Drug use: Never  . Sexual activity: Not Currently  Lifestyle  . Physical activity:    Days per week: Not on file    Minutes per session: Not on file  . Stress: Not on file  Relationships  . Social connections:    Talks on phone: Not on file    Gets together: Not on file    Attends religious service: Not on file    Active member of club or organization: Not on file    Attends meetings of clubs or organizations: Not on file    Relationship status: Not on file  . Intimate partner violence:    Fear of current or ex partner: Not on file    Emotionally abused: Not on file    Physically abused: Not on file    Forced sexual activity: Not on file  Other Topics Concern  . Not on file  Social History Narrative   Multiple family members with intolerance to statins.    Current Outpatient Medications on File Prior to Visit  Medication Sig Dispense Refill  . ACCU-CHEK FASTCLIX LANCETS MISC Check blood sugar once daily Dz:E11.9 100 each 3  . albuterol (PROAIR HFA) 108 (90 Base) MCG/ACT inhaler  Inhale 2 puffs into the lungs every 6 (six) hours as needed for wheezing or shortness of breath. 18 g 5  . aspirin 81 MG tablet Take 81 mg by mouth daily.     . B-D ULTRAFINE III SHORT PEN 31G X 8 MM MISC INJECT ONCE DAILY 100 each 3  . Blood Glucose Monitoring Suppl (ACCU-CHEK NANO SMARTVIEW) w/Device KIT Check blood sugar once daily 1 kit 0  . Calcium Carbonate (CALTRATE 600 PO) Take 600 mg by mouth 2 (two) times daily.     Marland Kitchen diltiazem (CARDIZEM CD) 240 MG 24 hr capsule Take 1 capsule (240 mg total) by mouth daily. 90 capsule 3  . FARXIGA 10 MG TABS tablet TAKE 1 TABLET EVERY  DAY 90 tablet 3  . fenofibrate 160 MG tablet TAKE 1 TABLET BY MOUTH EVERY DAY 90 tablet 0  . Fluticasone-Salmeterol (ADVAIR DISKUS) 250-50 MCG/DOSE AEPB INHALE 1 PUFF INTO LUNGS 2 TIMES DAILY (Patient taking differently: Inhale 1 puff into the lungs 2 (two) times daily as needed (wheezing). INHALE 1 PUFF INTO LUNGS 2 TIMES DAILY) 3 each 3  . Glucosamine-Chondroitin (OSTEO BI-FLEX REGULAR STRENGTH) 250-200 MG TABS Take 1 tablet by mouth 2 (two) times daily.     Marland Kitchen glucose blood (ACCU-CHEK SMARTVIEW) test strip Check blood sugars once daily 100 each 12  . Krill Oil 350 MG CAPS Take 350 mg by mouth daily.     Marland Kitchen levothyroxine (SYNTHROID, LEVOTHROID) 88 MCG tablet 1 tab po daily except on Tues and Sat take 2 tabs 90 tablet 0  . Magnesium 250 MG TABS Take 250-500 mg by mouth See admin instructions. Take 264m by mouth Sunday, Tuesday, Thursday and Saturday and 5023mMonday, Wednesday and Friday    . metFORMIN (GLUCOPHAGE-XR) 500 MG 24 hr tablet TAKE 2 TABLETS EVERY DAY (Patient taking differently: TAKE 500MG BY MOUTH TWICE DAILY) 180 tablet 3  . metoprolol tartrate (LOPRESSOR) 25 MG tablet Take 1 tablet (25 mg total) by mouth 2 (two) times daily. 180 tablet 3  . niacin (NIASPAN) 500 MG CR tablet 1 tab po qhs with lowfat snack and Aspirin 1/2 hour prior  Failed all statins and welchol 30 tablet 5  . nitroGLYCERIN (NITROSTAT) 0.4 MG  SL tablet Place 1 tablet (0.4 mg total) under the tongue every 5 (five) minutes as needed for chest pain. 50 tablet 3  . pantoprazole (PROTONIX) 40 MG tablet TAKE 1 TABLET BY MOUTH EVERY DAY 90 tablet 1  . pioglitazone (ACTOS) 30 MG tablet TAKE 1 TABLET (30 MG TOTAL) BY MOUTH DAILY. 90 tablet 3  . POTASSIUM CITRATE PO Take 1 tablet by mouth daily.    . Probiotic Product (PROBIOTIC DAILY PO) Take 99 mg by mouth daily.     . repaglinide (PRANDIN) 0.5 MG tablet TAKE 1 TABLET (0.5 MG TOTAL) BY MOUTH DAILY WITH SUPPER. 90 tablet 0  . ticagrelor (BRILINTA) 90 MG TABS tablet Take 1 tablet (90 mg total) by mouth 2 (two) times daily. 180 tablet 3  . triamterene-hydrochlorothiazide (MAXZIDE-25) 37.5-25 MG tablet TAKE 1/2 TABLET BY MOUTH EVERY DAY 45 tablet 3  . VICTOZA 18 MG/3ML SOPN INJECT 1.8MG(0.3ML) DAILY FOR 90 DAYS 9 mL 3   No current facility-administered medications on file prior to visit.     Allergies  Allergen Reactions  . Epinephrine Other (See Comments)    Pass out   . Statins Other (See Comments)    Myalgias: Simvastatin also caused back ache  . Welchol [Colesevelam Hcl]     myalgia  . Amoxicillin Itching and Rash    Has patient had a PCN reaction causing immediate rash, facial/tongue/throat swelling, SOB or lightheadedness with hypotension: No Has patient had a PCN reaction causing severe rash involving mucus membranes or skin necrosis: No Has patient had a PCN reaction that required hospitalization: No Has patient had a PCN reaction occurring within the last 10 years: Yes If all of the above answers are "NO", then may proceed with Cephalosporin use.     Family History  Problem Relation Age of Onset  . Alzheimer's disease Mother   . Diabetes type II Mother   . Hiatal hernia Mother   . Diabetes Mother   . Emphysema Father   . COPD Father   .  Depression Sister        suicide  . Diabetes Sister   . Diabetes Daughter   . Colon cancer Neg Hx   . Stomach cancer Neg Hx      BP (!) 118/56 (BP Location: Left Arm, Patient Position: Sitting, Cuff Size: Normal)   Pulse 87   Wt 167 lb 12.8 oz (76.1 kg)   SpO2 98%   BMI 27.50 kg/m    Review of Systems She seldom has nausea.     Objective:   Physical Exam VITAL SIGNS:  See vs page GENERAL: no distress Pulses: foot pulses are intact bilaterally.   MSK: no deformity of the feet or ankles.  CV: no edema of the legs or ankles Skin:  no ulcer on the feet or ankles.  normal color and temp on the feet and ankles Neuro: sensation is intact to touch on the feet and ankles.    Lab Results  Component Value Date   CREATININE 1.24 (H) 11/10/2017   BUN 26 (H) 11/10/2017   NA 138 11/10/2017   K 3.9 11/10/2017   CL 101 11/10/2017   CO2 25 11/10/2017   Lab Results  Component Value Date   HGBA1C 6.5 (A) 11/24/2017       Assessment & Plan:  Type 2 DM: well-controlled Renal insuff: this limits rx options   Patient Instructions  check your blood sugar once a day.  vary the time of day when you check, between before the 3 meals, and at bedtime.  also check if you have symptoms of your blood sugar being too high or too low.  please keep a record of the readings and bring it to your next appointment here.  You can write it on any piece of paper.  please call us sooner if your blood sugar goes below 70, or if you have a lot of readings over 200.   Please continue the same medications. Please come back for a follow-up appointment in 4 months.

## 2017-11-24 NOTE — Patient Instructions (Signed)
check your blood sugar once a day.  vary the time of day when you check, between before the 3 meals, and at bedtime.  also check if you have symptoms of your blood sugar being too high or too low.  please keep a record of the readings and bring it to your next appointment here.  You can write it on any piece of paper.  please call us sooner if your blood sugar goes below 70, or if you have a lot of readings over 200.   Please continue the same medications Please come back for a follow-up appointment in 4 months.    

## 2017-11-25 ENCOUNTER — Ambulatory Visit (INDEPENDENT_AMBULATORY_CARE_PROVIDER_SITE_OTHER): Payer: BLUE CROSS/BLUE SHIELD | Admitting: Pharmacist Clinician (PhC)/ Clinical Pharmacy Specialist

## 2017-11-25 ENCOUNTER — Encounter: Payer: Self-pay | Admitting: Pharmacist Clinician (PhC)/ Clinical Pharmacy Specialist

## 2017-11-25 DIAGNOSIS — E785 Hyperlipidemia, unspecified: Secondary | ICD-10-CM | POA: Diagnosis not present

## 2017-11-25 NOTE — Assessment & Plan Note (Signed)
Patient with long history of inability to tolerate statin drugs.  Currently on low dose niaspan as well as fenofibrate.   Will start insurance authorization for Repatha, as patient is no longer primary prevention.   Reviewed her medication list, patient currently on rather long list.   To simplify things, I am going to have her discontinue the fenofibrate and Niaspan at this time.  Niaspan dose was lowest available and I think it will not add any appreciable benefit to PCSK-9.  Her triglycerides are normal at this time, with the benefit of Repatha, as well as recent dietary changes.  Hopefully she will continue to do well without the addition of fenofibrate.  She is aware that we may need to restart it after her next labs are drawn in 2-3 months.

## 2017-11-25 NOTE — Patient Instructions (Addendum)
We will start the paperwork for Buna.    Come to the lab in the next few days to get your cholesterol labs drawn.  For now stop the fenofibrate and Niaspan.  We will start them again should we need to after you have been on the Repatha for 3-4 months.  Evolocumab injection What is this medicine? EVOLOCUMAB (e voe LOK ue mab) is known as a PCSK9 inhibitor. It is used to lower the level of cholesterol in the blood. It may be used alone or in combination with other cholesterol-lowering drugs. This drug may also be used to reduce the risk of heart attack, stroke, and certain types of heart surgery in patients with heart disease. This medicine may be used for other purposes; ask your health care provider or pharmacist if you have questions. COMMON BRAND NAME(S): REPATHA What should I tell my health care provider before I take this medicine? They need to know if you have any of these conditions: -an unusual or allergic reaction to evolocumab, other medicines, foods, dyes, or preservatives -pregnant or trying to get pregnant -breast-feeding How should I use this medicine? This medicine is for injection under the skin. You will be taught how to prepare and give this medicine. Use exactly as directed. Take your medicine at regular intervals. Do not take your medicine more often than directed. It is important that you put your used needles and syringes in a special sharps container. Do not put them in a trash can. If you do not have a sharps container, call your pharmacist or health care provider to get one. Talk to your pediatrician regarding the use of this medicine in children. While this drug may be prescribed for children as young as 13 years for selected conditions, precautions do apply. Overdosage: If you think you have taken too much of this medicine contact a poison control center or emergency room at once. NOTE: This medicine is only for you. Do not share this medicine with others. What if I  miss a dose? If you miss a dose, take it as soon as you can if there are more than 7 days until the next scheduled dose, or skip the missed dose and take the next dose according to your original schedule. Do not take double or extra doses. What may interact with this medicine? Interactions are not expected. This list may not describe all possible interactions. Give your health care provider a list of all the medicines, herbs, non-prescription drugs, or dietary supplements you use. Also tell them if you smoke, drink alcohol, or use illegal drugs. Some items may interact with your medicine. What should I watch for while using this medicine? You may need blood work while you are taking this medicine. What side effects may I notice from receiving this medicine? Side effects that you should report to your doctor or health care professional as soon as possible: -allergic reactions like skin rash, itching or hives, swelling of the face, lips, or tongue -signs and symptoms of infection like fever or chills; cough; sore throat; pain or trouble passing urine Side effects that usually do not require medical attention (report to your doctor or health care professional if they continue or are bothersome): -diarrhea -nausea -muscle pain -pain, redness, or irritation at site where injected This list may not describe all possible side effects. Call your doctor for medical advice about side effects. You may report side effects to FDA at 1-800-FDA-1088. Where should I keep my medicine? Keep out of  the reach of children. You will be instructed on how to store this medicine. Throw away any unused medicine after the expiration date on the label. NOTE: This sheet is a summary. It may not cover all possible information. If you have questions about this medicine, talk to your doctor, pharmacist, or health care provider.  2018 Elsevier/Gold Standard (2016-04-29 13:21:53)

## 2017-11-25 NOTE — Progress Notes (Signed)
11/25/2017 [name] [name] [birth year] [id number]   HPI:  [name] [name] is a 62 y.o. female patient of Dr [name] [name], who presents today for a lipid clinic evaluation.  In addition to hyperlipidemia, her medical history is significant for CAD post PCI to ostial/proximal LAD, hypertension, asthma and DM2.  After having the stent placement patient was noted to have significant bruising of her right forearm with swelling in the bicep area.  Venous dopplers were negative for DVT and she has been doing better since.    She reports having tried multiple statin drugs for her cholesterol, although she believes it has been a number of years since her last attempt.  She was noted to have muscle aches with each attempt.  Currently she is taking fenofibrate 160 mg and Niaspan 500 mg, and has for some time.  Unfortunately they have not had any impact on her LDL cholesterol, as looking back even 3 years, it has maintained in the 135-150 range at every check.    Current Medications:  Fenofibrate 160 mg qd  Niacin 500 mg qd  Cholesterol Goals:   LDL < 70  Intolerant/previously tried:  Aching muscles - rosuvastatin, pitavastatin, atorvastatin,  - 10 or more years ago  Family history:   Not familiar with father or his family, only that he died from emphysema  Most of mothers family had elevated cholesterol levels including both grandparents and multiple aunts/uncles.  Mother had first MI at 65.  Maternal grandfather had multiple strokes, starting in his 34's, died in early 54's.     Diet:   Has improved greatly since husband had CABG x 2 last year - now eating more veggies, healthy options; fewer fried foods.  Exercise:    No regular exercise; SOB prior to stents, now improved so hoping to get back into some regular exercise  Labs:   08/25/17:   TC 220, TG 124, HDL 50.2, LDL 145  10/22/16: TC 232, TG 132, HDL 51.3, LDL 154  Current Outpatient Medications  Medication Sig Dispense Refill  . ACCU-CHEK FASTCLIX  LANCETS MISC Check blood sugar once daily Dz:E11.9 100 each 3  . albuterol (PROAIR HFA) 108 (90 Base) MCG/ACT inhaler Inhale 2 puffs into the lungs every 6 (six) hours as needed for wheezing or shortness of breath. 18 g 5  . aspirin 81 MG tablet Take 81 mg by mouth daily.     . B-D ULTRAFINE III SHORT PEN 31G X 8 MM MISC INJECT ONCE DAILY 100 each 3  . Blood Glucose Monitoring Suppl (ACCU-CHEK NANO SMARTVIEW) w/Device KIT Check blood sugar once daily 1 kit 0  . Calcium Carbonate (CALTRATE 600 PO) Take 600 mg by mouth 2 (two) times daily.     Marland Kitchen diltiazem (CARDIZEM CD) 240 MG 24 hr capsule Take 1 capsule (240 mg total) by mouth daily. 90 capsule 3  . FARXIGA 10 MG TABS tablet TAKE 1 TABLET EVERY DAY 90 tablet 3  . fenofibrate 160 MG tablet TAKE 1 TABLET BY MOUTH EVERY DAY 90 tablet 0  . Fluticasone-Salmeterol (ADVAIR DISKUS) 250-50 MCG/DOSE AEPB INHALE 1 PUFF INTO LUNGS 2 TIMES DAILY (Patient taking differently: Inhale 1 puff into the lungs 2 (two) times daily as needed (wheezing). INHALE 1 PUFF INTO LUNGS 2 TIMES DAILY) 3 each 3  . Glucosamine-Chondroitin (OSTEO BI-FLEX REGULAR STRENGTH) 250-200 MG TABS Take 1 tablet by mouth 2 (two) times daily.     Marland Kitchen glucose blood (ACCU-CHEK SMARTVIEW) test strip Check blood sugars once daily 100  each 12  . Krill Oil 350 MG CAPS Take 350 mg by mouth daily.     Marland Kitchen levothyroxine (SYNTHROID, LEVOTHROID) 88 MCG tablet 1 tab po daily except on Tues and Sat take 2 tabs 90 tablet 0  . Magnesium 250 MG TABS Take 250-500 mg by mouth See admin instructions. Take 229m by mouth Sunday, Tuesday, Thursday and Saturday and 5075mMonday, Wednesday and Friday    . metFORMIN (GLUCOPHAGE-XR) 500 MG 24 hr tablet TAKE 2 TABLETS EVERY DAY (Patient taking differently: TAKE 500MG BY MOUTH TWICE DAILY) 180 tablet 3  . metoprolol tartrate (LOPRESSOR) 25 MG tablet Take 1 tablet (25 mg total) by mouth 2 (two) times daily. 180 tablet 3  . niacin (NIASPAN) 500 MG CR tablet 1 tab po qhs with  lowfat snack and Aspirin 1/2 hour prior  Failed all statins and welchol 30 tablet 5  . nitroGLYCERIN (NITROSTAT) 0.4 MG SL tablet Place 1 tablet (0.4 mg total) under the tongue every 5 (five) minutes as needed for chest pain. 50 tablet 3  . pantoprazole (PROTONIX) 40 MG tablet TAKE 1 TABLET BY MOUTH EVERY DAY 90 tablet 1  . pioglitazone (ACTOS) 30 MG tablet TAKE 1 TABLET (30 MG TOTAL) BY MOUTH DAILY. 90 tablet 3  . POTASSIUM CITRATE PO Take 1 tablet by mouth daily.    . Probiotic Product (PROBIOTIC DAILY PO) Take 99 mg by mouth daily.     . repaglinide (PRANDIN) 0.5 MG tablet TAKE 1 TABLET (0.5 MG TOTAL) BY MOUTH DAILY WITH SUPPER. 90 tablet 0  . ticagrelor (BRILINTA) 90 MG TABS tablet Take 1 tablet (90 mg total) by mouth 2 (two) times daily. 180 tablet 3  . triamterene-hydrochlorothiazide (MAXZIDE-25) 37.5-25 MG tablet TAKE 1/2 TABLET BY MOUTH EVERY DAY 45 tablet 3  . VICTOZA 18 MG/3ML SOPN INJECT 1.8MG(0.3ML) DAILY FOR 90 DAYS 9 mL 3   No current facility-administered medications for this visit.     Allergies  Allergen Reactions  . Epinephrine Other (See Comments)    Pass out   . Statins Other (See Comments)    Myalgias: Simvastatin also caused back ache  . Welchol [Colesevelam Hcl]     myalgia  . Amoxicillin Itching and Rash    Has patient had a PCN reaction causing immediate rash, facial/tongue/throat swelling, SOB or lightheadedness with hypotension: No Has patient had a PCN reaction causing severe rash involving mucus membranes or skin necrosis: No Has patient had a PCN reaction that required hospitalization: No Has patient had a PCN reaction occurring within the last 10 years: Yes If all of the above answers are "NO", then may proceed with Cephalosporin use.     Past Medical History:  Diagnosis Date  . Abdominal pain 08/10/2014  . Allergic rhinitis 08/24/2010  . Anterior neck pain 08/10/2014  . Asthma   . Asthma with acute exacerbation 04/27/2011  . Atypical chest pain     normal coronaries and LV function by cath 10/2011  . BACK PAIN 03/01/2009   Qualifier: Diagnosis of  By: [name] [name] . Cervical cancer screening 10/27/2012   Menarche at [age] S/p partial hysterectomy at 26 for precancer changes persistent despite cryo. Both ovaries left in place. No HRT Menopause early [id number], s/p 1 svd s/p episiomtomy and tear with stitches MGM up to date, h/o calcium deposits repeat MGMs stable   . Chest pain 11/20/2011  . Chronic pain disorder    neck and shoulder  . CTS (carpal tunnel  syndrome) 01/27/2013   right  . Diabetes mellitus type II   . Diabetic gastroparesis ([place])   . Esophageal reflux 08/25/2013   [facility], Dr Barth Kirks  . Fatty liver    abnormal transaminases; negative work up  . FATTY LIVER DISEASE 04/27/2007   Qualifier: Diagnosis of  By: Danelle Earthly CMA, Darlene    . Focal muscle atrophy 09/26/2015  . Fundic gland polyps of stomach, benign   . GERD (gastroesophageal reflux disease)   . Hip pain, bilateral 07/19/2012  . HTN (hypertension)   . Hyperlipidemia, mixed 05/04/2007   Qualifier: Diagnosis of  By: Wynona [name] crestor caused myalgias even at low dose Livalo caused myalgias Lipitor  Mother with severe reaction myalgias Simvastatin, Welchol   . Hypertension 08/24/2010  . Hypokalemia 05/25/2013   Improved stopping HCTZ. Was noted to have an elevated glucose when K was low. Recheck renal next week after starting Maxzide  . Hypomagnesemia 08/10/2014  . Hypothyroidism   . Laryngitis 08/25/2017  . Leg cramps 04/15/2016  . Nausea without vomiting 08/08/2014  . NONSPEC ELEVATION OF LEVELS OF TRANSAMINASE/LDH 05/01/2007   Qualifier: Diagnosis of  By: Garner Gavel    . Obesity 10/22/2016  . Osteoarthritis    chronic, right knee (11/04/2017)  . Other malaise and fatigue 01/27/2013  . OVARIAN CYST 07/11/2009   Qualifier: Diagnosis of  By: Wynona [name]   . Pain of both thighs 08/08/2014  . Plantar fasciitis   . Pneumonia    "twice"  (11/04/2017)  . Preventative health care 04/15/2016  . Rosacea 10/22/2016    There were no vitals taken for this visit.   Hyperlipidemia LDL goal <70 Patient with long history of inability to tolerate statin drugs.  Currently on low dose niaspan as well as fenofibrate.   Will start insurance authorization for Repatha, as patient is no longer primary prevention.   Reviewed her medication list, patient currently on rather long list.   To simplify things, I am going to have her discontinue the fenofibrate and Niaspan at this time.  Niaspan dose was lowest available and I think it will not add any appreciable benefit to PCSK-9.  Her triglycerides are normal at this time, with the benefit of Repatha, as well as recent dietary changes.  Hopefully she will continue to do well without the addition of fenofibrate.  She is aware that we may need to restart it after her next labs are drawn in 2-3 months.   Tommy Medal PharmD CPP [facility]

## 2017-11-26 ENCOUNTER — Other Ambulatory Visit: Payer: Self-pay

## 2017-11-26 MED ORDER — PANTOPRAZOLE SODIUM 40 MG PO TBEC: 40.0000 mg | DELAYED_RELEASE_TABLET | Freq: Every day | ORAL | 1 refills | Status: DC

## 2017-11-28 ENCOUNTER — Other Ambulatory Visit: Payer: Self-pay | Admitting: Pharmacist Clinician (PhC)/ Clinical Pharmacy Specialist

## 2017-11-28 DIAGNOSIS — E785 Hyperlipidemia, unspecified: Secondary | ICD-10-CM

## 2017-12-18 ENCOUNTER — Ambulatory Visit: Payer: BLUE CROSS/BLUE SHIELD | Admitting: Family Medicine

## 2017-12-21 ENCOUNTER — Other Ambulatory Visit: Payer: Self-pay | Admitting: Endocrinology

## 2018-01-05 ENCOUNTER — Ambulatory Visit: Payer: Self-pay

## 2018-01-05 NOTE — Telephone Encounter (Signed)
  Incoming call from patient with complaint of vertigo. Patient states that she was startled. Last night at 227 am. Experienced vertigo and has been experiencing every since.  States not as bad.  This am but still experiencing. States blood sugar was 127 this am.  States her normal range is from 109- 120.  States she is type 2 Diabetic.  Does not take insulin.  Is on an oral agent.  Has not missed any doses.  Denies any difficulty breathing, fever, frequently urination,  Dizziness, nor vomiting.  Patient drove to work. Related to patient not to move quickly.  Move steadily and slow.   If her symptoms worsen to call back.  Pt. Voiced understanding.      Answer Assessment - Initial Assessment Questions 1. BLOOD GLUCOSE: "What is your blood glucose level?"      127 2. ONSET: "When did you check the blood glucose?"     7am 3. USUAL RANGE: "What is your glucose level usually?" (e.g., usual fasting morning value, usual evening value)     Normal range 109 to 120 4. KETONES: "Do you check for ketones (urine or blood test strips)?" If yes, ask: "What does the test show now?"      na 5. TYPE 1 or 2:  "Do you know what type of diabetes you have?"  (e.g., Type 1, Type 2, Gestational; doesn't know)      Type 2 6. INSULIN: "Do you take insulin?" "What type of insulin(s) do you use? What is the mode of delivery? (syringe, pen (e.g., injection or  pump)?"      No oral 7. DIABETES PILLS: "Do you take any pills for your diabetes?" If yes, ask: "Have you missed taking any pills recently?"     no 8. OTHER SYMPTOMS: "Do you have any symptoms?" (e.g., fever, frequent urination, difficulty breathing, dizziness, weakness, vomiting)     no 9. PREGNANCY: "Is there any chance you are pregnant?" "When was your last menstrual period?"     na  Protocols used: DIABETES - HIGH BLOOD SUGAR-A-AH

## 2018-01-13 ENCOUNTER — Ambulatory Visit: Payer: BLUE CROSS/BLUE SHIELD | Admitting: Family Medicine

## 2018-01-13 VITALS — BP 112/60 | HR 87 | Temp 97.9°F | Resp 18 | Ht 66.0 in | Wt 169.4 lb

## 2018-01-13 DIAGNOSIS — D649 Anemia, unspecified: Secondary | ICD-10-CM

## 2018-01-13 DIAGNOSIS — I2510 Atherosclerotic heart disease of native coronary artery without angina pectoris: Secondary | ICD-10-CM

## 2018-01-13 DIAGNOSIS — R195 Other fecal abnormalities: Secondary | ICD-10-CM

## 2018-01-13 DIAGNOSIS — E6609 Other obesity due to excess calories: Secondary | ICD-10-CM

## 2018-01-13 DIAGNOSIS — E039 Hypothyroidism, unspecified: Secondary | ICD-10-CM | POA: Diagnosis not present

## 2018-01-13 DIAGNOSIS — Z9861 Coronary angioplasty status: Secondary | ICD-10-CM

## 2018-01-13 DIAGNOSIS — E119 Type 2 diabetes mellitus without complications: Secondary | ICD-10-CM | POA: Diagnosis not present

## 2018-01-13 DIAGNOSIS — I10 Essential (primary) hypertension: Secondary | ICD-10-CM

## 2018-01-13 LAB — CBC
HEMATOCRIT: 34.9 % — AB (ref 36.0–46.0)
HEMOGLOBIN: 11 g/dL — AB (ref 12.0–15.0)
MCHC: 31.6 g/dL (ref 30.0–36.0)
MCV: 74.2 fl — ABNORMAL LOW (ref 78.0–100.0)
PLATELETS: 349 10*3/uL (ref 150.0–400.0)
RBC: 4.7 Mil/uL (ref 3.87–5.11)
RDW: 15.2 % (ref 11.5–15.5)
WBC: 9 10*3/uL (ref 4.0–10.5)

## 2018-01-13 LAB — TSH: TSH: 0.17 u[IU]/mL — ABNORMAL LOW (ref 0.35–4.50)

## 2018-01-13 LAB — FERRITIN: Ferritin: 9.7 ng/mL — ABNORMAL LOW (ref 10.0–291.0)

## 2018-01-13 NOTE — Progress Notes (Signed)
Subjective:    Patient ID: [name] [name], female    DOB: 12-12-1955, 62 y.o.   MRN: [id number]  No chief complaint on file.   HPI Patient is in today to recheck her TSH level. Patient reports that she has been feeling great since her cardiac catheterization, with marked improvement in her chest pain and dyspnea. She reports an episode of vertigo ~7 days that occurred after she was startled awake in the middle of the night by her tv, and lasted until around noon the next day. She did not take any medication for this problem, and it resolved on its own. She said that she has had this problem before, and used to take an antihistamine for it, but she was concerned about interactions with her other medications, as she has had a lot of changes with her meds in the past two months.   She also reports loose stools (5 on the [facility] school chart) since her cardiac catheterization. These happen about 1x every day, and nothing has made them better or worse. She denies any changes in her diet, and she denies any fever, chills, or nausea.  Past Medical History:  Diagnosis Date  . Abdominal pain 08/10/2014  . Allergic rhinitis 08/24/2010  . Anterior neck pain 08/10/2014  . Asthma   . Asthma with acute exacerbation 04/27/2011  . Atypical chest pain    normal coronaries and LV function by cath 10/2011  . BACK PAIN 03/01/2009   Qualifier: Diagnosis of  By: Wynona Luna   . Cervical cancer screening 10/27/2012   Menarche at 55, regular S/p partial hysterectomy at 26 for precancer changes persistent despite cryo. Both ovaries left in place. No HRT Menopause early 60s G1P1, s/p 1 svd s/p episiomtomy and tear with stitches MGM up to date, h/o calcium deposits repeat MGMs stable   . Chest pain 11/20/2011  . Chronic pain disorder    neck and shoulder  . CTS (carpal tunnel syndrome) 01/27/2013   right  . Diabetes mellitus type II   . Diabetic gastroparesis ([place])   . Esophageal reflux 08/25/2013   [place]  [facility], Dr Barth Kirks  . Fatty liver    abnormal transaminases; negative work up  . FATTY LIVER DISEASE 04/27/2007   Qualifier: Diagnosis of  By: Danelle Earthly CMA, Darlene    . Focal muscle atrophy 09/26/2015  . Fundic gland polyps of stomach, benign   . GERD (gastroesophageal reflux disease)   . Hip pain, bilateral 07/19/2012  . HTN (hypertension)   . Hyperlipidemia, mixed 05/04/2007   Qualifier: Diagnosis of  By: Wynona Luna crestor caused myalgias even at low dose Livalo caused myalgias Lipitor  Mother with severe reaction myalgias Simvastatin, Welchol   . Hypertension 08/24/2010  . Hypokalemia 05/25/2013   Improved stopping HCTZ. Was noted to have an elevated glucose when K was low. Recheck renal next week after starting Maxzide  . Hypomagnesemia 08/10/2014  . Hypothyroidism   . Laryngitis 08/25/2017  . Leg cramps 04/15/2016  . Nausea without vomiting 08/08/2014  . NONSPEC ELEVATION OF LEVELS OF TRANSAMINASE/LDH 05/01/2007   Qualifier: Diagnosis of  By: Garner Gavel    . Obesity 10/22/2016  . Osteoarthritis    chronic, right knee (11/04/2017)  . Other malaise and fatigue 01/27/2013  . OVARIAN CYST 07/11/2009   Qualifier: Diagnosis of  By: Wynona Luna   . Pain of both thighs 08/08/2014  . Plantar fasciitis   . Pneumonia    "twice" (  11/04/2017)  . Preventative health care 04/15/2016  . Rosacea 10/22/2016    Past Surgical History:  Procedure Laterality Date  . AUGMENTATION MAMMAPLASTY Bilateral 2002  . COLONOSCOPY    . CORONARY STENT INTERVENTION N/A 11/04/2017   Procedure: CORONARY STENT INTERVENTION;  Surgeon: Nelva Bush, MD;  Location: [place] [facility];  Service: Cardiovascular;  Laterality: N/A;  . ESOPHAGOGASTRODUODENOSCOPY    . INTRAVASCULAR PRESSURE WIRE/FFR STUDY N/A 11/04/2017   Procedure: INTRAVASCULAR PRESSURE WIRE/FFR STUDY;  Surgeon: Nelva Bush, MD;  Location: [place] [facility];  Service: Cardiovascular;  Laterality: N/A;  . INTRAVASCULAR ULTRASOUND/IVUS N/A  11/04/2017   Procedure: Intravascular Ultrasound/IVUS;  Surgeon: Nelva Bush, MD;  Location: [place] [facility];  Service: Cardiovascular;  Laterality: N/A;  . KNEE ARTHROSCOPY  1998, [id number]  . LAPAROSCOPIC CHOLECYSTECTOMY    . LEFT HEART CATH AND CORONARY ANGIOGRAPHY N/A 11/04/2017   Procedure: LEFT HEART CATH AND CORONARY ANGIOGRAPHY;  Surgeon: Nelva Bush, MD;  Location: [place] [facility];  Service: Cardiovascular;  Laterality: N/A;  . LEFT HEART CATHETERIZATION WITH CORONARY ANGIOGRAM N/A 11/21/2011   Procedure: LEFT HEART CATHETERIZATION WITH CORONARY ANGIOGRAM;  Surgeon: Josue Hector, MD;  Location: [facility];  Service: Cardiovascular;  Laterality: N/A;  . TONSILLECTOMY  1975  . VAGINAL HYSTERECTOMY  1983   "still have my ovaries"    Family History  Problem Relation Age of Onset  . Alzheimer's disease Mother   . Diabetes type II Mother   . Hiatal hernia Mother   . Diabetes Mother   . Emphysema Father   . COPD Father   . Depression Sister        suicide  . Diabetes Sister   . Diabetes Daughter   . Colon cancer Neg Hx   . Stomach cancer Neg Hx     Social History   Socioeconomic History  . Marital status: Married    Spouse name: Not on file  . Number of children: 1  . Years of education: Not on file  . Highest education level: Not on file  Occupational History  . Occupation: IT TECH, admin. assistant    Employer: [employer]  Social Needs  . Financial resource strain: Not on file  . Food insecurity:    Worry: Not on file    Inability: Not on file  . Transportation needs:    Medical: Not on file    Non-medical: Not on file  Tobacco Use  . Smoking status: Never Smoker  . Smokeless tobacco: Never Used  Substance and Sexual Activity  . Alcohol use: Not Currently  . Drug use: Never  . Sexual activity: Not Currently  Lifestyle  . Physical activity:    Days per week: Not on file    Minutes per session: Not on file  . Stress: Not on file    Relationships  . Social connections:    Talks on phone: Not on file    Gets together: Not on file    Attends religious service: Not on file    Active member of club or organization: Not on file    Attends meetings of clubs or organizations: Not on file    Relationship status: Not on file  . Intimate partner violence:    Fear of current or ex partner: Not on file    Emotionally abused: Not on file    Physically abused: Not on file    Forced sexual activity: Not on file  Other Topics Concern  . Not on  file  Social History Narrative   Multiple family members with intolerance to statins.    Outpatient Medications Prior to Visit  Medication Sig Dispense Refill  . ACCU-CHEK FASTCLIX LANCETS MISC Check blood sugar once daily Dz:E11.9 100 each 3  . aspirin 81 MG tablet Take 81 mg by mouth daily.     . B-D ULTRAFINE III SHORT PEN 31G X 8 MM MISC INJECT ONCE DAILY 100 each 3  . Blood Glucose Monitoring Suppl (ACCU-CHEK NANO SMARTVIEW) w/Device KIT Check blood sugar once daily 1 kit 0  . Calcium Carbonate (CALTRATE 600 PO) Take 600 mg by mouth 2 (two) times daily.     Marland Kitchen diltiazem (CARDIZEM CD) 240 MG 24 hr capsule Take 1 capsule (240 mg total) by mouth daily. 90 capsule 3  . FARXIGA 10 MG TABS tablet TAKE 1 TABLET EVERY DAY 90 tablet 3  . fenofibrate 160 MG tablet TAKE 1 TABLET BY MOUTH EVERY DAY 90 tablet 0  . Glucosamine-Chondroitin (OSTEO BI-FLEX REGULAR STRENGTH) 250-200 MG TABS Take 1 tablet by mouth 2 (two) times daily.     Marland Kitchen glucose blood (ACCU-CHEK SMARTVIEW) test strip Check blood sugars once daily 100 each 12  . Krill Oil 350 MG CAPS Take 350 mg by mouth daily.     Marland Kitchen levothyroxine (SYNTHROID, LEVOTHROID) 88 MCG tablet 1 tab po daily except on Tues and Sat take 2 tabs 90 tablet 0  . Magnesium 250 MG TABS Take 250-500 mg by mouth See admin instructions. Take 211m by mouth Sunday, Tuesday, Thursday and Saturday and 5066mMonday, Wednesday and Friday    . metFORMIN (GLUCOPHAGE-XR) 500  MG 24 hr tablet TAKE 2 TABLETS EVERY DAY (Patient taking differently: TAKE 500MG BY MOUTH TWICE DAILY) 180 tablet 3  . metoprolol tartrate (LOPRESSOR) 25 MG tablet Take 1 tablet (25 mg total) by mouth 2 (two) times daily. 180 tablet 3  . niacin (NIASPAN) 500 MG CR tablet 1 tab po qhs with lowfat snack and Aspirin 1/2 hour prior  Failed all statins and welchol 30 tablet 5  . nitroGLYCERIN (NITROSTAT) 0.4 MG SL tablet Place 1 tablet (0.4 mg total) under the tongue every 5 (five) minutes as needed for chest pain. 50 tablet 3  . pantoprazole (PROTONIX) 40 MG tablet Take 1 tablet (40 mg total) by mouth daily. 90 tablet 1  . pioglitazone (ACTOS) 30 MG tablet TAKE 1 TABLET BY MOUTH EVERY DAY 90 tablet 3  . POTASSIUM CITRATE PO Take 1 tablet by mouth daily.    . Probiotic Product (PROBIOTIC DAILY PO) Take 99 mg by mouth daily.     . repaglinide (PRANDIN) 0.5 MG tablet TAKE 1 TABLET (0.5 MG TOTAL) BY MOUTH DAILY WITH SUPPER. 90 tablet 0  . ticagrelor (BRILINTA) 90 MG TABS tablet Take 1 tablet (90 mg total) by mouth 2 (two) times daily. 180 tablet 3  . triamterene-hydrochlorothiazide (MAXZIDE-25) 37.5-25 MG tablet TAKE 1/2 TABLET BY MOUTH EVERY DAY 45 tablet 3  . VICTOZA 18 MG/3ML SOPN INJECT 1.8MG(0.3ML) DAILY FOR 90 DAYS 9 mL 3  . albuterol (PROAIR HFA) 108 (90 Base) MCG/ACT inhaler Inhale 2 puffs into the lungs every 6 (six) hours as needed for wheezing or shortness of breath. 18 g 5  . Fluticasone-Salmeterol (ADVAIR DISKUS) 250-50 MCG/DOSE AEPB INHALE 1 PUFF INTO LUNGS 2 TIMES DAILY (Patient taking differently: Inhale 1 puff into the lungs 2 (two) times daily as needed (wheezing). INHALE 1 PUFF INTO LUNGS 2 TIMES DAILY) 3 each 3   No  facility-administered medications prior to visit.     Allergies  Allergen Reactions  . Epinephrine Other (See Comments)    Pass out   . Statins Other (See Comments)    Myalgias: Simvastatin also caused back ache  . Welchol [Colesevelam Hcl]     myalgia  .  Amoxicillin Itching and Rash    Has patient had a PCN reaction causing immediate rash, facial/tongue/throat swelling, SOB or lightheadedness with hypotension: No Has patient had a PCN reaction causing severe rash involving mucus membranes or skin necrosis: No Has patient had a PCN reaction that required hospitalization: No Has patient had a PCN reaction occurring within the last 10 years: Yes If all of the above answers are "NO", then may proceed with Cephalosporin use.     ROS Constitutional: Negative for fever and malaise/fatigue.  HENT: Negative for congestion.   Eyes: Negative for blurred vision.  Respiratory: Negative for shortness of breath.   Cardiovascular: Negative for chest pain, palpitations and leg swelling.  Gastrointestinal: Negative for abdominal pain, blood in stool and nausea.  Genitourinary: Negative for dysuria and frequency.  Musculoskeletal: Negative for falls.  Skin: Negative for rash.  Neurological: Negative for dizziness, loss of consciousness and headaches.  Endo/Heme/Allergies: Negative for environmental allergies.  Psychiatric/Behavioral: Negative for depression. The patient is not nervous/anxious.      Objective:    Physical Exam Constitutional: She is oriented to person, place, and time. She appears well-developed and well-nourished. No distress.  HENT:  Head: Normocephalic and atraumatic.  Nose: Nose normal.  Eyes: Right eye exhibits no discharge. Left eye exhibits no discharge.  Neck: Normal range of motion. Neck supple.  Cardiovascular: Normal rate and regular rhythm.  No murmur heard. Pulmonary/Chest: Effort normal and breath sounds normal.  Abdominal: Soft. Bowel sounds are normal. There is no tenderness.  Musculoskeletal: She exhibits no edema.  Neurological: She is alert and oriented to person, place, and time.  Skin: Skin is warm and dry.   BP 112/60 (BP Location: Left Arm, Patient Position: Sitting, Cuff Size: Normal)   Pulse 87   Temp  97.9 F (36.6 C) (Oral)   Resp 18   Ht 5' 6"  (1.676 m)   Wt 169 lb 6.4 oz (76.8 kg)   SpO2 98%   BMI 27.34 kg/m  Wt Readings from Last 3 Encounters:  01/13/18 169 lb 6.4 oz (76.8 kg)  11/24/17 167 lb 12.8 oz (76.1 kg)  11/17/17 170 lb 3.2 oz (77.2 kg)     Lab Results  Component Value Date   WBC 8.9 11/10/2017   HGB 11.2 (L) 11/10/2017   HCT 34.3 (L) 11/10/2017   PLT 369.0 11/10/2017   GLUCOSE 148 (H) 11/10/2017   CHOL 220 (H) 08/25/2017   TRIG 124.0 08/25/2017   HDL 50.20 08/25/2017   LDLDIRECT 151.0 06/01/2015   LDLCALC 145 (H) 08/25/2017   ALT 15 11/10/2017   AST 16 11/10/2017   NA 138 11/10/2017   K 3.9 11/10/2017   CL 101 11/10/2017   CREATININE 1.24 (H) 11/10/2017   BUN 26 (H) 11/10/2017   CO2 25 11/10/2017   TSH 4.960 (H) 11/03/2017   INR 1.07 11/20/2011   HGBA1C 6.5 (A) 11/24/2017   MICROALBUR <0.7 10/22/2016    Lab Results  Component Value Date   TSH 4.960 (H) 11/03/2017   Lab Results  Component Value Date   WBC 8.9 11/10/2017   HGB 11.2 (L) 11/10/2017   HCT 34.3 (L) 11/10/2017   MCV 77.4 (L) 11/10/2017  PLT 369.0 11/10/2017   Lab Results  Component Value Date   NA 138 11/10/2017   K 3.9 11/10/2017   CO2 25 11/10/2017   GLUCOSE 148 (H) 11/10/2017   BUN 26 (H) 11/10/2017   CREATININE 1.24 (H) 11/10/2017   BILITOT 0.4 11/10/2017   ALKPHOS 47 11/10/2017   AST 16 11/10/2017   ALT 15 11/10/2017   PROT 7.3 11/10/2017   ALBUMIN 4.4 11/10/2017   CALCIUM 10.1 11/10/2017   ANIONGAP 9 11/05/2017   GFR 46.42 (L) 11/10/2017   Lab Results  Component Value Date   CHOL 220 (H) 08/25/2017   Lab Results  Component Value Date   HDL 50.20 08/25/2017   Lab Results  Component Value Date   LDLCALC 145 (H) 08/25/2017   Lab Results  Component Value Date   TRIG 124.0 08/25/2017   Lab Results  Component Value Date   CHOLHDL 4 08/25/2017   Lab Results  Component Value Date   HGBA1C 6.5 (A) 11/24/2017       Assessment & Plan:    Hypothyroidism: Recheck TSH  Anemia: Recheck CBC and Ferritin  Vertigo: Follow up with physician if recurs.   Loose Stool: Probiotic and benefiber or metamucil. Follow up if does not improve.   Problem List Items Addressed This Visit      Cardiovascular and Mediastinum   CAD S/P percutaneous coronary angioplasty    Follows with Dr Percival Spanish and is feeling much better s/p stenting.           Endocrine   Hypothyroidism - Primary   Relevant Orders   TSH     Other   Anemia   Relevant Orders   CBC   Ferritin      I have discontinued Lular Letson. Wedemeyer's Fluticasone-Salmeterol and albuterol. I am also having her maintain her aspirin, Glucosamine-Chondroitin, niacin, VICTOZA, ACCU-CHEK NANO SMARTVIEW, B-D ULTRAFINE III SHORT PEN, ACCU-CHEK FASTCLIX LANCETS, glucose blood, metFORMIN, nitroGLYCERIN, Magnesium, POTASSIUM CITRATE PO, Calcium Carbonate (CALTRATE 600 PO), Krill Oil, diltiazem, triamterene-hydrochlorothiazide, fenofibrate, FARXIGA, Probiotic Product (PROBIOTIC DAILY PO), metoprolol tartrate, ticagrelor, levothyroxine, repaglinide, pantoprazole, and pioglitazone.  No orders of the defined types were placed in this encounter.    Verdis Frederickson, Medical Student   Patient seen with and examined with student.  Agree with documentation See separate note for further documentation

## 2018-01-13 NOTE — Progress Notes (Signed)
Subjective:  I acted as a Education administrator for Dr. Charlett Blake. [name] [name], [place]  Patient ID: [name] [name], female    DOB: February 28, 1956, 62 y.o.   MRN: [id number]  No chief complaint on file.   HPI  Patient is in today for 2 month follow up and she notes she is feeling much better since her recent cardiac stenting. Her dyspnea is resolved. She needs her TSH rechecked.  She reports an episode of vertigo ~7 days that occurred after she was startled awake in the middle of the night by her tv, and lasted until around noon the next day. She did not take any medication for this problem, and it resolved on its own. She said that she has had this problem before, and used to take an antihistamine for it, but she was concerned about interactions with her other medications, as she has had a lot of changes with her meds in the past two months.   She has noted a loose stool daily since her cardiac procedure. She denies any changes in her diet, and she denies any fever, chills, or nausea. Denies CP/palp/SOB/HA/congestion/fevers or GU c/o. Taking meds as prescribed  Patient Care Team: Mosie Lukes, MD as PCP - General (Family Medicine) Minus Breeding, MD as PCP - Cardiology (Cardiology) Herminio Commons, DO (Osteopathic Medicine)   Past Medical History:  Diagnosis Date  . Abdominal pain 08/10/2014  . Allergic rhinitis 08/24/2010  . Anterior neck pain 08/10/2014  . Asthma   . Asthma with acute exacerbation 04/27/2011  . Atypical chest pain    normal coronaries and LV function by cath 10/2011  . BACK PAIN 03/01/2009   Qualifier: Diagnosis of  By: Wynona Luna   . Cervical cancer screening 10/27/2012   Menarche at 85, regular S/p partial hysterectomy at 26 for precancer changes persistent despite cryo. Both ovaries left in place. No HRT Menopause early 20s G1P1, s/p 1 svd s/p episiomtomy and tear with stitches MGM up to date, h/o calcium deposits repeat MGMs stable   . Chest pain 11/20/2011  . Chronic pain disorder      neck and shoulder  . CTS (carpal tunnel syndrome) 01/27/2013   right  . Diabetes mellitus type II   . Diabetic gastroparesis ([place])   . Esophageal reflux 08/25/2013   [place], Dr Barth Kirks  . Fatty liver    abnormal transaminases; negative work up  . FATTY LIVER DISEASE 04/27/2007   Qualifier: Diagnosis of  By: Danelle Earthly CMA, Darlene    . Focal muscle atrophy 09/26/2015  . Fundic gland polyps of stomach, benign   . GERD (gastroesophageal reflux disease)   . Hip pain, bilateral 07/19/2012  . HTN (hypertension)   . Hyperlipidemia, mixed 05/04/2007   Qualifier: Diagnosis of  By: Wynona Luna crestor caused myalgias even at low dose Livalo caused myalgias Lipitor  Mother with severe reaction myalgias Simvastatin, Welchol   . Hypertension 08/24/2010  . Hypokalemia 05/25/2013   Improved stopping HCTZ. Was noted to have an elevated glucose when K was low. Recheck renal next week after starting Maxzide  . Hypomagnesemia 08/10/2014  . Hypothyroidism   . Laryngitis 08/25/2017  . Leg cramps 04/15/2016  . Nausea without vomiting 08/08/2014  . NONSPEC ELEVATION OF LEVELS OF TRANSAMINASE/LDH 05/01/2007   Qualifier: Diagnosis of  By: Garner Gavel    . Obesity 10/22/2016  . Osteoarthritis    chronic, right knee (11/04/2017)  . Other malaise and fatigue 01/27/2013  . OVARIAN CYST  07/11/2009   Qualifier: Diagnosis of  By: Wynona Luna   . Pain of both thighs 08/08/2014  . Plantar fasciitis   . Pneumonia    "twice" (11/04/2017)  . Preventative health care 04/15/2016  . Rosacea 10/22/2016    Past Surgical History:  Procedure Laterality Date  . AUGMENTATION MAMMAPLASTY Bilateral 2002  . COLONOSCOPY    . CORONARY STENT INTERVENTION N/A 11/04/2017   Procedure: CORONARY STENT INTERVENTION;  Surgeon: Nelva Bush, MD;  Location: [facility];  Service: Cardiovascular;  Laterality: N/A;  . ESOPHAGOGASTRODUODENOSCOPY    . INTRAVASCULAR PRESSURE WIRE/FFR STUDY N/A 11/04/2017   Procedure:  INTRAVASCULAR PRESSURE WIRE/FFR STUDY;  Surgeon: Nelva Bush, MD;  Location: [facility];  Service: Cardiovascular;  Laterality: N/A;  . INTRAVASCULAR ULTRASOUND/IVUS N/A 11/04/2017   Procedure: Intravascular Ultrasound/IVUS;  Surgeon: Nelva Bush, MD;  Location: [facility];  Service: Cardiovascular;  Laterality: N/A;  . KNEE ARTHROSCOPY  1998, [id number]  . LAPAROSCOPIC CHOLECYSTECTOMY    . LEFT HEART CATH AND CORONARY ANGIOGRAPHY N/A 11/04/2017   Procedure: LEFT HEART CATH AND CORONARY ANGIOGRAPHY;  Surgeon: Nelva Bush, MD;  Location: [facility];  Service: Cardiovascular;  Laterality: N/A;  . LEFT HEART CATHETERIZATION WITH CORONARY ANGIOGRAM N/A 11/21/2011   Procedure: LEFT HEART CATHETERIZATION WITH CORONARY ANGIOGRAM;  Surgeon: Josue Hector, MD;  Location: [facility];  Service: Cardiovascular;  Laterality: N/A;  . TONSILLECTOMY  1975  . VAGINAL HYSTERECTOMY  1983   "still have my ovaries"    Family History  Problem Relation Age of Onset  . Alzheimer's disease Mother   . Diabetes type II Mother   . Hiatal hernia Mother   . Diabetes Mother   . Emphysema Father   . COPD Father   . Depression Sister        suicide  . Diabetes Sister   . Diabetes Daughter   . Colon cancer Neg Hx   . Stomach cancer Neg Hx     Social History   Socioeconomic History  . Marital status: Married    Spouse name: Not on file  . Number of children: 1  . Years of education: Not on file  . Highest education level: Not on file  Occupational History  . Occupation: IT TECH, admin. assistant    Employer: [employer]  Social Needs  . Financial resource strain: Not on file  . Food insecurity:    Worry: Not on file    Inability: Not on file  . Transportation needs:    Medical: Not on file    Non-medical: Not on file  Tobacco Use  . Smoking status: Never Smoker  . Smokeless tobacco: Never Used  Substance and Sexual Activity  . Alcohol use: Not Currently  . Drug  use: Never  . Sexual activity: Not Currently  Lifestyle  . Physical activity:    Days per week: Not on file    Minutes per session: Not on file  . Stress: Not on file  Relationships  . Social connections:    Talks on phone: Not on file    Gets together: Not on file    Attends religious service: Not on file    Active member of club or organization: Not on file    Attends meetings of clubs or organizations: Not on file    Relationship status: Not on file  . Intimate partner violence:    Fear of current or ex partner: Not on file  Emotionally abused: Not on file    Physically abused: Not on file    Forced sexual activity: Not on file  Other Topics Concern  . Not on file  Social History Narrative   Multiple family members with intolerance to statins.    Outpatient Medications Prior to Visit  Medication Sig Dispense Refill  . ACCU-CHEK FASTCLIX LANCETS MISC Check blood sugar once daily Dz:E11.9 100 each 3  . aspirin 81 MG tablet Take 81 mg by mouth daily.     . B-D ULTRAFINE III SHORT PEN 31G X 8 MM MISC INJECT ONCE DAILY 100 each 3  . Blood Glucose Monitoring Suppl (ACCU-CHEK NANO SMARTVIEW) w/Device KIT Check blood sugar once daily 1 kit 0  . Calcium Carbonate (CALTRATE 600 PO) Take 600 mg by mouth 2 (two) times daily.     Marland Kitchen diltiazem (CARDIZEM CD) 240 MG 24 hr capsule Take 1 capsule (240 mg total) by mouth daily. 90 capsule 3  . FARXIGA 10 MG TABS tablet TAKE 1 TABLET EVERY DAY 90 tablet 3  . fenofibrate 160 MG tablet TAKE 1 TABLET BY MOUTH EVERY DAY 90 tablet 0  . Glucosamine-Chondroitin (OSTEO BI-FLEX REGULAR STRENGTH) 250-200 MG TABS Take 1 tablet by mouth 2 (two) times daily.     Marland Kitchen glucose blood (ACCU-CHEK SMARTVIEW) test strip Check blood sugars once daily 100 each 12  . Krill Oil 350 MG CAPS Take 350 mg by mouth daily.     Marland Kitchen levothyroxine (SYNTHROID, LEVOTHROID) 88 MCG tablet 1 tab po daily except on Tues and Sat take 2 tabs 90 tablet 0  . Magnesium 250 MG TABS Take  250-500 mg by mouth See admin instructions. Take 212m by mouth Sunday, Tuesday, Thursday and Saturday and 5049mMonday, Wednesday and Friday    . metFORMIN (GLUCOPHAGE-XR) 500 MG 24 hr tablet TAKE 2 TABLETS EVERY DAY (Patient taking differently: TAKE 500MG BY MOUTH TWICE DAILY) 180 tablet 3  . metoprolol tartrate (LOPRESSOR) 25 MG tablet Take 1 tablet (25 mg total) by mouth 2 (two) times daily. 180 tablet 3  . niacin (NIASPAN) 500 MG CR tablet 1 tab po qhs with lowfat snack and Aspirin 1/2 hour prior  Failed all statins and welchol 30 tablet 5  . nitroGLYCERIN (NITROSTAT) 0.4 MG SL tablet Place 1 tablet (0.4 mg total) under the tongue every 5 (five) minutes as needed for chest pain. 50 tablet 3  . pantoprazole (PROTONIX) 40 MG tablet Take 1 tablet (40 mg total) by mouth daily. 90 tablet 1  . pioglitazone (ACTOS) 30 MG tablet TAKE 1 TABLET BY MOUTH EVERY DAY 90 tablet 3  . POTASSIUM CITRATE PO Take 1 tablet by mouth daily.    . Probiotic Product (PROBIOTIC DAILY PO) Take 99 mg by mouth daily.     . repaglinide (PRANDIN) 0.5 MG tablet TAKE 1 TABLET (0.5 MG TOTAL) BY MOUTH DAILY WITH SUPPER. 90 tablet 0  . ticagrelor (BRILINTA) 90 MG TABS tablet Take 1 tablet (90 mg total) by mouth 2 (two) times daily. 180 tablet 3  . triamterene-hydrochlorothiazide (MAXZIDE-25) 37.5-25 MG tablet TAKE 1/2 TABLET BY MOUTH EVERY DAY 45 tablet 3  . VICTOZA 18 MG/3ML SOPN INJECT 1.8MG(0.3ML) DAILY FOR 90 DAYS 9 mL 3  . albuterol (PROAIR HFA) 108 (90 Base) MCG/ACT inhaler Inhale 2 puffs into the lungs every 6 (six) hours as needed for wheezing or shortness of breath. 18 g 5  . Fluticasone-Salmeterol (ADVAIR DISKUS) 250-50 MCG/DOSE AEPB INHALE 1 PUFF INTO LUNGS 2 TIMES DAILY (  Patient taking differently: Inhale 1 puff into the lungs 2 (two) times daily as needed (wheezing). INHALE 1 PUFF INTO LUNGS 2 TIMES DAILY) 3 each 3   No facility-administered medications prior to visit.     Allergies  Allergen Reactions  .  Epinephrine Other (See Comments)    Pass out   . Statins Other (See Comments)    Myalgias: Simvastatin also caused back ache  . Welchol [Colesevelam Hcl]     myalgia  . Amoxicillin Itching and Rash    Has patient had a PCN reaction causing immediate rash, facial/tongue/throat swelling, SOB or lightheadedness with hypotension: No Has patient had a PCN reaction causing severe rash involving mucus membranes or skin necrosis: No Has patient had a PCN reaction that required hospitalization: No Has patient had a PCN reaction occurring within the last 10 years: Yes If all of the above answers are "NO", then may proceed with Cephalosporin use.     Review of Systems  Constitutional: Negative for fever and malaise/fatigue.  HENT: Negative for congestion.   Eyes: Negative for blurred vision.  Respiratory: Negative for shortness of breath.   Cardiovascular: Negative for chest pain, palpitations and leg swelling.  Gastrointestinal: Positive for diarrhea. Negative for abdominal pain, blood in stool, melena and nausea.  Genitourinary: Negative for dysuria and frequency.  Musculoskeletal: Negative for falls.  Skin: Negative for rash.  Neurological: Positive for dizziness. Negative for loss of consciousness and headaches.  Endo/Heme/Allergies: Negative for environmental allergies.  Psychiatric/Behavioral: Negative for depression. The patient is not nervous/anxious.        Objective:    Physical Exam  Constitutional: She is oriented to person, place, and time. She appears well-developed and well-nourished. No distress.  HENT:  Head: Normocephalic and atraumatic.  Nose: Nose normal.  Eyes: Right eye exhibits no discharge. Left eye exhibits no discharge.  Neck: Normal range of motion. Neck supple.  Cardiovascular: Normal rate and regular rhythm.  No murmur heard. Pulmonary/Chest: Effort normal and breath sounds normal.  Abdominal: Soft. Bowel sounds are normal. There is no tenderness.    Musculoskeletal: She exhibits no edema.  Neurological: She is alert and oriented to person, place, and time.  Skin: Skin is warm and dry.  Psychiatric: She has a normal mood and affect.  Nursing note and vitals reviewed.   There were no vitals taken for this visit. Wt Readings from Last 3 Encounters:  11/24/17 167 lb 12.8 oz (76.1 kg)  11/17/17 170 lb 3.2 oz (77.2 kg)  11/14/17 169 lb (76.7 kg)   BP Readings from Last 3 Encounters:  11/24/17 (!) 118/56  11/17/17 104/60  11/14/17 110/62     Immunization History  Administered Date(s) Administered  . Influenza Split 03/27/2011, 04/08/2012  . Influenza Whole 03/14/2009, 02/06/2010  . Influenza,inj,Quad PF,6+ Mos 01/27/2013, 05/02/2014, 04/12/2017  . Pneumococcal Polysaccharide-23 03/28/2008  . Tdap 09/18/2011  . Zoster 04/15/2016    Health Maintenance  Topic Date Due  . HIV Screening  09/28/1969  . MAMMOGRAM  07/04/2017  . COLONOSCOPY  09/13/2017  . URINE MICROALBUMIN  10/22/2017  . INFLUENZA VACCINE  12/25/2017  . OPHTHALMOLOGY EXAM  04/03/2018  . HEMOGLOBIN A1C  05/27/2018  . FOOT EXAM  11/25/2018  . PAP SMEAR  04/16/2019  . TETANUS/TDAP  09/17/2021  . PNEUMOCOCCAL POLYSACCHARIDE VACCINE AGE 33-64 HIGH RISK  Completed  . Hepatitis C Screening  Completed    Lab Results  Component Value Date   WBC 8.9 11/10/2017   HGB 11.2 (L) 11/10/2017  HCT 34.3 (L) 11/10/2017   PLT 369.0 11/10/2017   GLUCOSE 148 (H) 11/10/2017   CHOL 220 (H) 08/25/2017   TRIG 124.0 08/25/2017   HDL 50.20 08/25/2017   LDLDIRECT 151.0 06/01/2015   LDLCALC 145 (H) 08/25/2017   ALT 15 11/10/2017   AST 16 11/10/2017   NA 138 11/10/2017   K 3.9 11/10/2017   CL 101 11/10/2017   CREATININE 1.24 (H) 11/10/2017   BUN 26 (H) 11/10/2017   CO2 25 11/10/2017   TSH 4.960 (H) 11/03/2017   INR 1.07 11/20/2011   HGBA1C 6.5 (A) 11/24/2017   MICROALBUR <0.7 10/22/2016    Lab Results  Component Value Date   TSH 4.960 (H) 11/03/2017   Lab Results   Component Value Date   WBC 8.9 11/10/2017   HGB 11.2 (L) 11/10/2017   HCT 34.3 (L) 11/10/2017   MCV 77.4 (L) 11/10/2017   PLT 369.0 11/10/2017   Lab Results  Component Value Date   NA 138 11/10/2017   K 3.9 11/10/2017   CO2 25 11/10/2017   GLUCOSE 148 (H) 11/10/2017   BUN 26 (H) 11/10/2017   CREATININE 1.24 (H) 11/10/2017   BILITOT 0.4 11/10/2017   ALKPHOS 47 11/10/2017   AST 16 11/10/2017   ALT 15 11/10/2017   PROT 7.3 11/10/2017   ALBUMIN 4.4 11/10/2017   CALCIUM 10.1 11/10/2017   ANIONGAP 9 11/05/2017   GFR 46.42 (L) 11/10/2017   Lab Results  Component Value Date   CHOL 220 (H) 08/25/2017   Lab Results  Component Value Date   HDL 50.20 08/25/2017   Lab Results  Component Value Date   LDLCALC 145 (H) 08/25/2017   Lab Results  Component Value Date   TRIG 124.0 08/25/2017   Lab Results  Component Value Date   CHOLHDL 4 08/25/2017   Lab Results  Component Value Date   HGBA1C 6.5 (A) 11/24/2017         Assessment & Plan:   Problem List Items Addressed This Visit    None      I have discontinued [name] [name]. [name] [name]'s Fluticasone-Salmeterol and albuterol. I am also having her maintain her aspirin, Glucosamine-Chondroitin, niacin, VICTOZA, ACCU-CHEK NANO SMARTVIEW, B-D ULTRAFINE III SHORT PEN, ACCU-CHEK FASTCLIX LANCETS, glucose blood, metFORMIN, nitroGLYCERIN, Magnesium, POTASSIUM CITRATE PO, Calcium Carbonate (CALTRATE 600 PO), Krill Oil, diltiazem, triamterene-hydrochlorothiazide, fenofibrate, FARXIGA, Probiotic Product (PROBIOTIC DAILY PO), metoprolol tartrate, ticagrelor, levothyroxine, repaglinide, pantoprazole, and pioglitazone.  No orders of the defined types were placed in this encounter.   CMA served as Education administrator during this visit. History, Physical and Plan performed by medical provider. Documentation and orders reviewed and attested to.  Magdalene Molly, [place]

## 2018-01-13 NOTE — Assessment & Plan Note (Signed)
Follows with Dr Percival Spanish and is feeling much better s/p stenting.

## 2018-01-13 NOTE — Patient Instructions (Addendum)
Call lipid clinic and confirm next steps  NOW company probiotic, Benefiber or metamucil [email or web address] Hypothyroidism Hypothyroidism is a disorder of the thyroid. The thyroid is a large gland that is located in the lower front of the neck. The thyroid releases hormones that control how the body works. With hypothyroidism, the thyroid does not make enough of these hormones. What are the causes? Causes of hypothyroidism may include:  Viral infections.  Pregnancy.  Your own defense system (immune system) attacking your thyroid.  Certain medicines.  Birth defects.  Past radiation treatments to your head or neck.  Past treatment with radioactive iodine.  Past surgical removal of part or all of your thyroid.  Problems with the gland that is located in the center of your brain (pituitary).  What are the signs or symptoms? Signs and symptoms of hypothyroidism may include:  Feeling as though you have no energy (lethargy).  Inability to tolerate cold.  Weight gain that is not explained by a change in diet or exercise habits.  Dry skin.  Coarse hair.  Menstrual irregularity.  Slowing of thought processes.  Constipation.  Sadness or depression.  How is this diagnosed? Your health care provider may diagnose hypothyroidism with blood tests and ultrasound tests. How is this treated? Hypothyroidism is treated with medicine that replaces the hormones that your body does not make. After you begin treatment, it may take several weeks for symptoms to go away. Follow these instructions at home:  Take medicines only as directed by your health care provider.  If you start taking any new medicines, tell your health care provider.  Keep all follow-up visits as directed by your health care provider. This is important. As your condition improves, your dosage needs may change. You will need to have blood tests regularly so that your health care provider can watch your  condition. Contact a health care provider if:  Your symptoms do not get better with treatment.  You are taking thyroid replacement medicine and: ? You sweat excessively. ? You have tremors. ? You feel anxious. ? You lose weight rapidly. ? You cannot tolerate heat. ? You have emotional swings. ? You have diarrhea. ? You feel weak. Get help right away if:  You develop chest pain.  You develop an irregular heartbeat.  You develop a rapid heartbeat. This information is not intended to replace advice given to you by your health care provider. Make sure you discuss any questions you have with your health care provider. Document Released: 05/13/2005 Document Revised: 10/19/2015 Document Reviewed: 09/28/2013 Elsevier Interactive Patient Education  2018 [employer].

## 2018-01-14 ENCOUNTER — Other Ambulatory Visit: Payer: Self-pay

## 2018-01-14 DIAGNOSIS — D649 Anemia, unspecified: Secondary | ICD-10-CM

## 2018-01-14 DIAGNOSIS — E039 Hypothyroidism, unspecified: Secondary | ICD-10-CM

## 2018-01-15 ENCOUNTER — Other Ambulatory Visit: Payer: Self-pay | Admitting: Pharmacist Clinician (PhC)/ Clinical Pharmacy Specialist

## 2018-01-15 MED ORDER — EVOLOCUMAB 140 MG/ML ~~LOC~~ SOAJ: 140.0000 mg | SUBCUTANEOUS | 12 refills | Status: DC

## 2018-01-18 DIAGNOSIS — R195 Other fecal abnormalities: Secondary | ICD-10-CM | POA: Insufficient documentation

## 2018-01-18 NOTE — Assessment & Plan Note (Signed)
hgba1c acceptable, minimize simple carbs. Increase exercise as tolerated.  

## 2018-01-18 NOTE — Assessment & Plan Note (Signed)
Maintain heart healthy diet and stay as active as tolerated.

## 2018-01-18 NOTE — Assessment & Plan Note (Signed)
Notes a loose stool roughly once daily since her recent cardiac procedure no bloody or tarry stool. Encouraged to try a probiotic and fiber supplement and see if that is helpful. Report if worsens.

## 2018-01-18 NOTE — Assessment & Plan Note (Signed)
Well controlled, no changes to meds. Encouraged heart healthy diet such as the DASH diet and exercise as tolerated.  °

## 2018-01-18 NOTE — Assessment & Plan Note (Signed)
On Levothyroxine, continue to monitor 

## 2018-01-27 ENCOUNTER — Ambulatory Visit: Payer: BLUE CROSS/BLUE SHIELD | Admitting: Family Medicine

## 2018-01-28 NOTE — Telephone Encounter (Unsigned)
Copied from [place] [id number]. Topic: Appointment Scheduling - Scheduling Inquiry for Clinic >> Jan 14, 2018 10:11 AM Synthia Innocent wrote: Reason for CRM: Patient called to scheduled 3 mth lab follow up, no orders in system.

## 2018-02-08 ENCOUNTER — Other Ambulatory Visit: Payer: Self-pay | Admitting: Family Medicine

## 2018-02-14 ENCOUNTER — Other Ambulatory Visit: Payer: Self-pay | Admitting: Endocrinology

## 2018-02-22 ENCOUNTER — Encounter: Payer: Self-pay | Admitting: Cardiology

## 2018-02-22 NOTE — Progress Notes (Signed)
Cardiology Office Note   Date:  02/23/2018   ID:  [name], [name] 06/30/1955, MRN [id number]  PCP:  Mosie Lukes, MD  Cardiologist:   Minus Breeding, MD Referring:  Mosie Lukes, MD  Chief Complaint  Patient presents with  . Coronary Artery Disease      History of Present Illness: [name] [name] is a 62 y.o. female who presents for evaluation of CAD.  She had dyspnea with normal echo and a coronary CTA was abnormal showing LAD disease. Cath done 11/04/17 showed 60% LAD, FFR was significant and she underwent PCI with DES. She had mild scattered disease elsewhere.     Since this procedure she is done much better.  She is much less short of breath although she is noticed occasionally now she is starting to get a little of that symptom when she might do something like walking through the yard.  However, it is not nearly like it used to be.  She denies any chest pressure, neck or arm discomfort she has not had any palpitations, presyncope or syncope.  Said no weight gain or.   Past Medical History:  Diagnosis Date  . Asthma   . Chronic pain disorder    neck and shoulder  . CTS (carpal tunnel syndrome) 01/27/2013   right  . Diabetes mellitus type II   . Diabetic gastroparesis ([place])   . Esophageal reflux 08/25/2013   [place], Dr Barth Kirks  . FATTY LIVER DISEASE 04/27/2007   Qualifier: Diagnosis of  By: Danelle Earthly CMA, Darlene    . Fundic gland polyps of stomach, benign   . HTN (hypertension)   . Hyperlipidemia, mixed 05/04/2007   Qualifier: Diagnosis of  By: Wynona Luna crestor caused myalgias even at low dose Livalo caused myalgias Lipitor  Mother with severe reaction myalgias Simvastatin, Welchol   . Hypertension 08/24/2010  . Hypokalemia 05/25/2013   Improved stopping HCTZ. Was noted to have an elevated glucose when K was low. Recheck renal next week after starting Maxzide  . Hypomagnesemia 08/10/2014  . Hypothyroidism   . Laryngitis 08/25/2017  . Leg cramps  04/15/2016  . Nausea without vomiting 08/08/2014  . NONSPEC ELEVATION OF LEVELS OF TRANSAMINASE/LDH 05/01/2007   Qualifier: Diagnosis of  By: Garner Gavel    . Osteoarthritis    chronic, right knee (11/04/2017)  . OVARIAN CYST 07/11/2009   Qualifier: Diagnosis of  By: Wynona Luna   . Plantar fasciitis   . Rosacea 10/22/2016    Past Surgical History:  Procedure Laterality Date  . AUGMENTATION MAMMAPLASTY Bilateral 2002  . COLONOSCOPY    . CORONARY STENT INTERVENTION N/A 11/04/2017   Procedure: CORONARY STENT INTERVENTION;  Surgeon: Nelva Bush, MD;  Location: [place] [facility];  Service: Cardiovascular;  Laterality: N/A;  . ESOPHAGOGASTRODUODENOSCOPY    . INTRAVASCULAR PRESSURE WIRE/FFR STUDY N/A 11/04/2017   Procedure: INTRAVASCULAR PRESSURE WIRE/FFR STUDY;  Surgeon: Nelva Bush, MD;  Location: [place] [facility];  Service: Cardiovascular;  Laterality: N/A;  . INTRAVASCULAR ULTRASOUND/IVUS N/A 11/04/2017   Procedure: Intravascular Ultrasound/IVUS;  Surgeon: Nelva Bush, MD;  Location: [place] [facility];  Service: Cardiovascular;  Laterality: N/A;  . KNEE ARTHROSCOPY  1998, [id number]  . LAPAROSCOPIC CHOLECYSTECTOMY    . LEFT HEART CATH AND CORONARY ANGIOGRAPHY N/A 11/04/2017   Procedure: LEFT HEART CATH AND CORONARY ANGIOGRAPHY;  Surgeon: Nelva Bush, MD;  Location: [place] [facility];  Service: Cardiovascular;  Laterality: N/A;  . LEFT  HEART CATHETERIZATION WITH CORONARY ANGIOGRAM N/A 11/21/2011   Procedure: LEFT HEART CATHETERIZATION WITH CORONARY ANGIOGRAM;  Surgeon: Josue Hector, MD;  Location: [facility];  Service: Cardiovascular;  Laterality: N/A;  . TONSILLECTOMY  1975  . VAGINAL HYSTERECTOMY  1983   "still have my ovaries"     Current Outpatient Medications  Medication Sig Dispense Refill  . ACCU-CHEK FASTCLIX LANCETS MISC Check blood sugar once daily Dz:E11.9 100 each 3  . aspirin 81 MG tablet Take 81 mg by mouth daily.     . B-D ULTRAFINE III SHORT  PEN 31G X 8 MM MISC INJECT ONCE DAILY 100 each 3  . Blood Glucose Monitoring Suppl (ACCU-CHEK NANO SMARTVIEW) w/Device KIT Check blood sugar once daily 1 kit 0  . Calcium Carbonate (CALTRATE 600 PO) Take 600 mg by mouth 2 (two) times daily.     . Evolocumab (REPATHA SURECLICK) 409 MG/ML SOAJ Inject 140 mg into the skin every 14 (fourteen) days. 2 pen 12  . FARXIGA 10 MG TABS tablet TAKE 1 TABLET EVERY DAY 90 tablet 3  . fenofibrate 160 MG tablet TAKE 1 TABLET BY MOUTH EVERY DAY 90 tablet 0  . Glucosamine-Chondroitin (OSTEO BI-FLEX REGULAR STRENGTH) 250-200 MG TABS Take 1 tablet by mouth 2 (two) times daily.     Marland Kitchen glucose blood (ACCU-CHEK SMARTVIEW) test strip Check blood sugars once daily 100 each 12  . Krill Oil 350 MG CAPS Take 350 mg by mouth daily.     Marland Kitchen levothyroxine (SYNTHROID, LEVOTHROID) 88 MCG tablet 1 tab po daily except on Tues and Sat take 2 tabs 90 tablet 0  . Magnesium 250 MG TABS Take 250-500 mg by mouth See admin instructions. Take 292m by mouth Sunday, Tuesday, Thursday and Saturday and 5073mMonday, Wednesday and Friday    . metFORMIN (GLUCOPHAGE) 500 MG tablet Take 500 mg by mouth 2 (two) times daily with a meal. TAKE 1 TABLET BY MOUTH 2 TIMES A DAY    . metoprolol tartrate (LOPRESSOR) 25 MG tablet Take 1 tablet (25 mg total) by mouth 2 (two) times daily. 180 tablet 3  . nitroGLYCERIN (NITROSTAT) 0.4 MG SL tablet Place 1 tablet (0.4 mg total) under the tongue every 5 (five) minutes as needed for chest pain. 50 tablet 3  . pantoprazole (PROTONIX) 40 MG tablet Take 1 tablet (40 mg total) by mouth daily. 90 tablet 1  . pioglitazone (ACTOS) 30 MG tablet TAKE 1 TABLET BY MOUTH EVERY DAY 90 tablet 3  . POTASSIUM CITRATE PO Take 1 tablet by mouth daily.    . Probiotic Product (PROBIOTIC DAILY PO) Take 99 mg by mouth daily.     . repaglinide (PRANDIN) 0.5 MG tablet TAKE 1 TABLET (0.5 MG TOTAL) BY MOUTH DAILY WITH SUPPER. 90 tablet 0  . ticagrelor (BRILINTA) 90 MG TABS tablet Take 1  tablet (90 mg total) by mouth 2 (two) times daily. 180 tablet 3  . triamterene-hydrochlorothiazide (MAXZIDE-25) 37.5-25 MG tablet TAKE 1/2 TABLET BY MOUTH EVERY DAY 45 tablet 3  . VICTOZA 18 MG/3ML SOPN INJECT 1.8MG(0.3ML) DAILY FOR 90 DAYS 9 mL 3  . diltiazem (CARDIZEM CD) 240 MG 24 hr capsule Take 1 capsule (240 mg total) by mouth daily. 90 capsule 3   No current facility-administered medications for this visit.     Allergies:   Epinephrine; Statins; Welchol [colesevelam hcl]; and Amoxicillin     ROS:  Please see the history of present illness.   Otherwise, review of systems are positive for  none.   All other systems are reviewed and negative.    PHYSICAL EXAM: VS:  BP 116/64   Pulse 74   Ht 5' 5.5" (1.664 m)   Wt 166 lb (75.3 kg)   BMI 27.20 kg/m  , BMI Body mass index is 27.2 kg/m. GENERAL:  Well appearing NECK:  No jugular venous distention, waveform within normal limits, carotid upstroke brisk and symmetric, no bruits, no thyromegaly LUNGS:  Clear to auscultation bilaterally CHEST:  Unremarkable HEART:  PMI not displaced or sustained,S1 and S2 within normal limits, no S3, no S4, no clicks, no rubs, no murmurs ABD:  Flat, positive bowel sounds normal in frequency in pitch, no bruits, no rebound, no guarding, no midline pulsatile mass, no hepatomegaly, no splenomegaly EXT:  2 plus pulses throughout, no edema, no cyanosis no clubbing   EKG:  EKG  not ordered today.    Recent Labs: 08/25/2017: Magnesium 1.2 11/10/2017: ALT 15; BUN 26; Creatinine, Ser 1.24; Potassium 3.9; Sodium 138 01/13/2018: Hemoglobin 11.0; Platelets 349.0; TSH 0.17    Lipid Panel    Component Value Date/Time   CHOL 220 (H) 08/25/2017 1620   TRIG 124.0 08/25/2017 1620   HDL 50.20 08/25/2017 1620   CHOLHDL 4 08/25/2017 1620   VLDL 24.8 08/25/2017 1620   LDLCALC 145 (H) 08/25/2017 1620   LDLDIRECT 151.0 06/01/2015 0812      Wt Readings from Last 3 Encounters:  02/23/18 166 lb (75.3 kg)    01/13/18 169 lb 6.4 oz (76.8 kg)  11/24/17 167 lb 12.8 oz (76.1 kg)      Other studies Reviewed: Additional studies/ records that were reviewed today include: None Review of the above records demonstrates:     ASSESSMENT AND PLAN:  CAD:   The patient has no new sypmtoms.  No further cardiovascular testing is indicated.  We will continue with aggressive risk reduction and meds as listed.  HTN:    The blood pressure is at target. No change in medications is indicated. We will continue with therapeutic lifestyle changes (TLC).  HYPERLIPIDEMIA:     It took a long time for her to get Repatha sent to her and then she thinks she had problems with the injections.  We walked her through this and she gave herself an injection today.  She is intolerant of statins.  We will check lipids after the fifth dose.    Current medicines are reviewed at length with the patient today.  The patient does not have concerns regarding medicines.  The following changes have been made:  None  Labs/ tests ordered today include: None No orders of the defined types were placed in this encounter.    Disposition:   FU with APP in six months.      Signed, Minus Breeding, MD  02/23/2018 9:03 AM    [facility]

## 2018-02-23 ENCOUNTER — Encounter: Payer: Self-pay | Admitting: Cardiology

## 2018-02-23 ENCOUNTER — Ambulatory Visit: Payer: BLUE CROSS/BLUE SHIELD | Admitting: Cardiology

## 2018-02-23 VITALS — BP 116/64 | HR 74 | Ht 65.5 in | Wt 166.0 lb

## 2018-02-23 DIAGNOSIS — I10 Essential (primary) hypertension: Secondary | ICD-10-CM

## 2018-02-23 DIAGNOSIS — I2510 Atherosclerotic heart disease of native coronary artery without angina pectoris: Secondary | ICD-10-CM | POA: Diagnosis not present

## 2018-02-23 DIAGNOSIS — E785 Hyperlipidemia, unspecified: Secondary | ICD-10-CM | POA: Diagnosis not present

## 2018-02-23 DIAGNOSIS — Z9861 Coronary angioplasty status: Secondary | ICD-10-CM | POA: Diagnosis not present

## 2018-02-23 NOTE — Patient Instructions (Signed)
Medication Instructions:  Continue current medications  If you need a refill on your cardiac medications before your next appointment, please call your pharmacy.  Labwork: None Ordered   Testing/Procedures: None Ordered   Follow-Up: Your physician wants you to follow-up in: 6 Months You should receive a reminder letter in the mail two months in advance. If you do not receive a letter, please call our office in [phone] to schedule your follow-up appointment.     Thank you for choosing [facility]!!       

## 2018-03-03 ENCOUNTER — Other Ambulatory Visit: Payer: Self-pay | Admitting: Family Medicine

## 2018-03-03 DIAGNOSIS — Z1231 Encounter for screening mammogram for malignant neoplasm of breast: Secondary | ICD-10-CM

## 2018-03-12 IMAGING — CT CT HEAD W/O CM
1 series · 16 of 30 positions shown, 20 images · non-contrast
Comparison: None.

CLINICAL DATA: Patient complains of LEFT cheek sinking in for 1
month. No injury or numbness.

EXAM:
CT HEAD WITHOUT CONTRAST
TECHNIQUE: Contiguous axial images were obtained from the base of the skull
through the vertex without intravenous contrast.

[Series 2: head wo · axial · 0.42mm/px · z∈[-136,-6]mm · 16 of 30 slices shown, 20 images]
[im 2/30  brain]
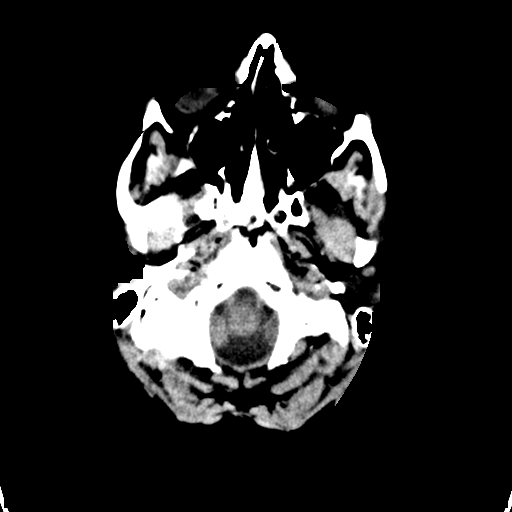
[im 2/30  bone]
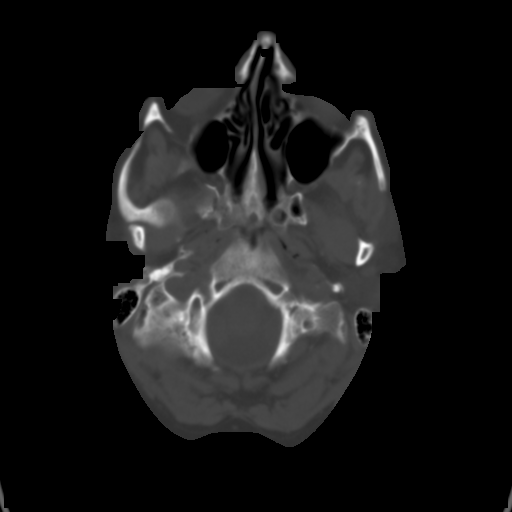
[im 4/30  brain]
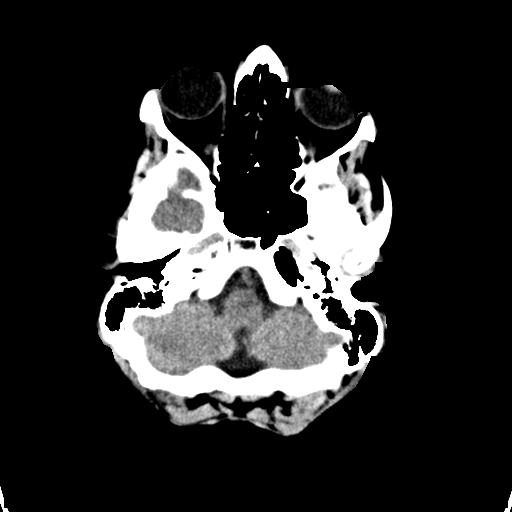
[im 6/30  brain]
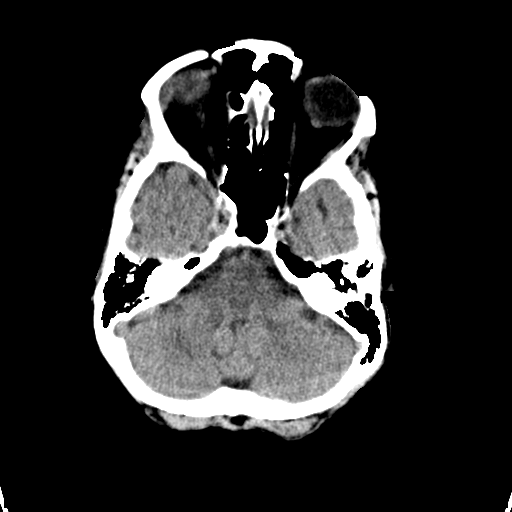
[im 8/30  brain]
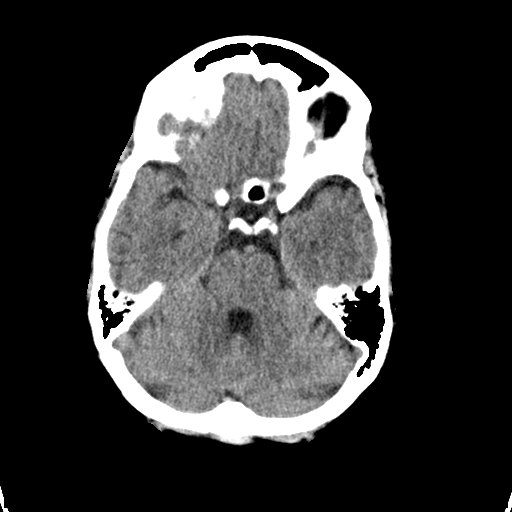
[im 9/30  brain]
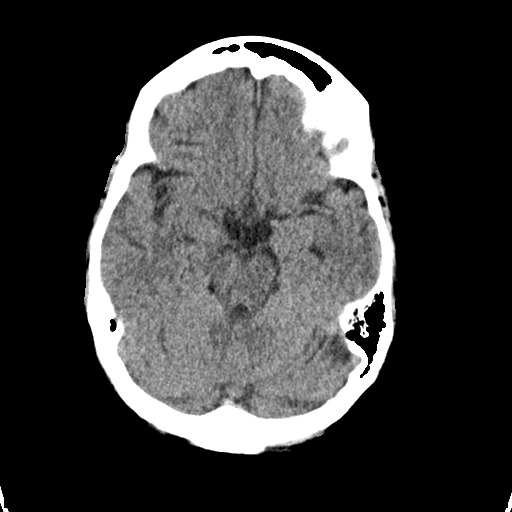
[im 9/30  bone]
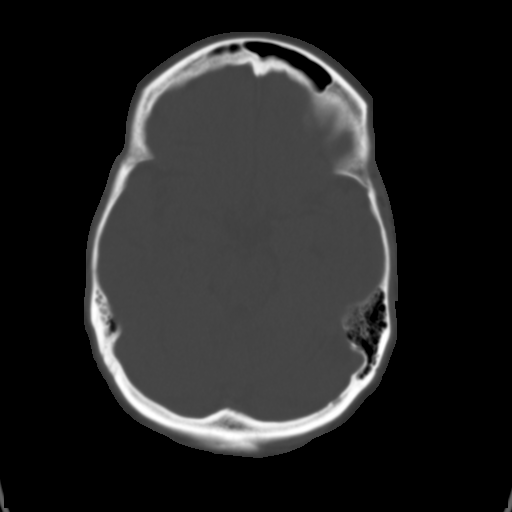
[im 11/30  brain]
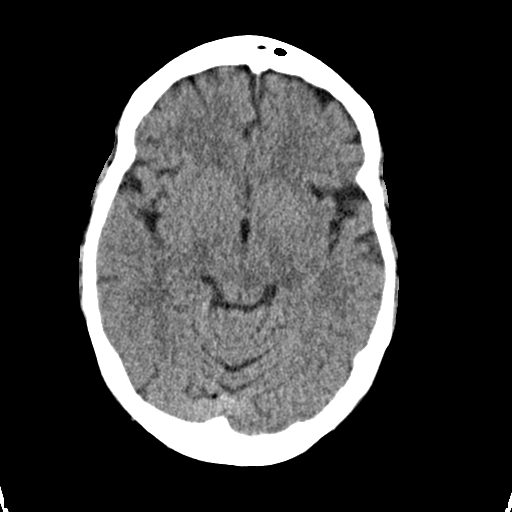
[im 13/30  brain]
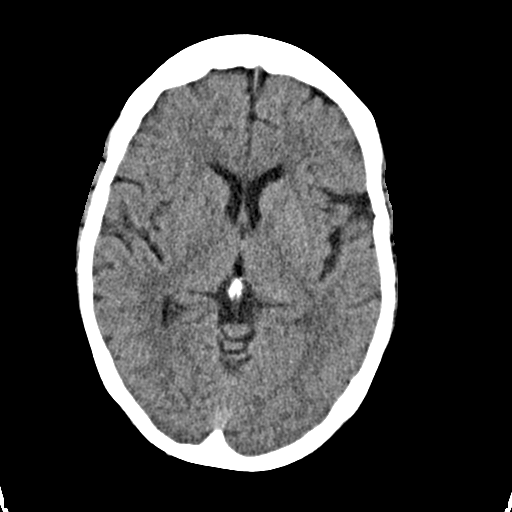
[im 15/30  brain]
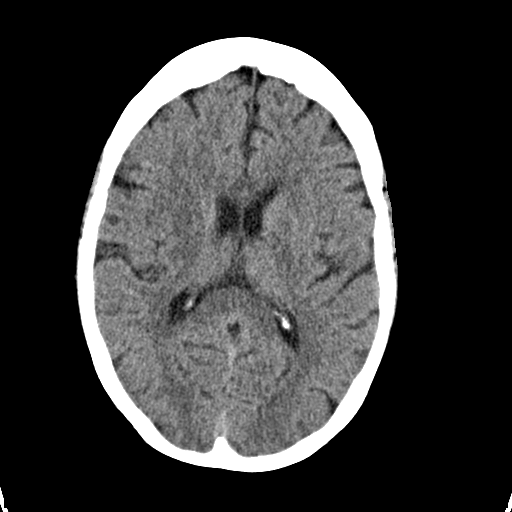
[im 16/30  brain]
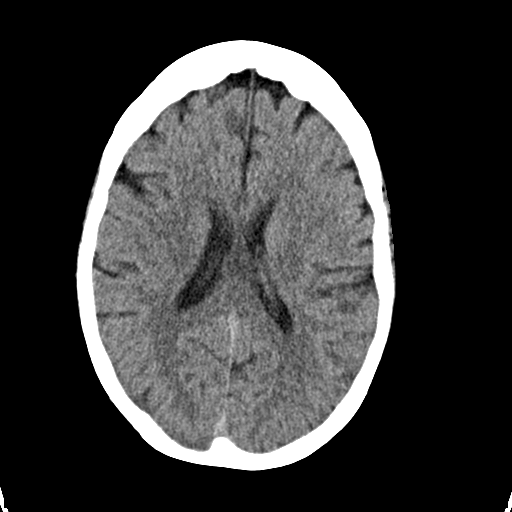
[im 16/30  bone]
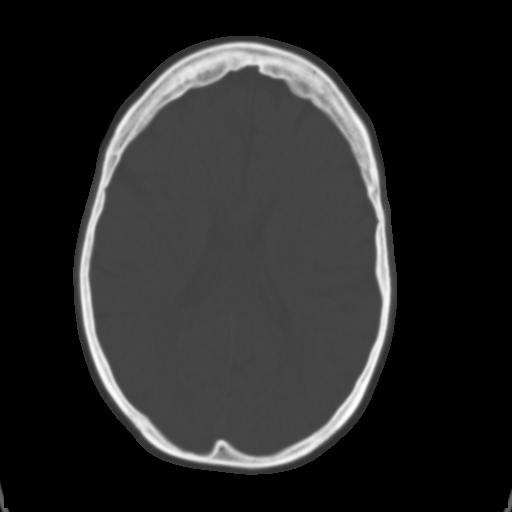
[im 18/30  brain]
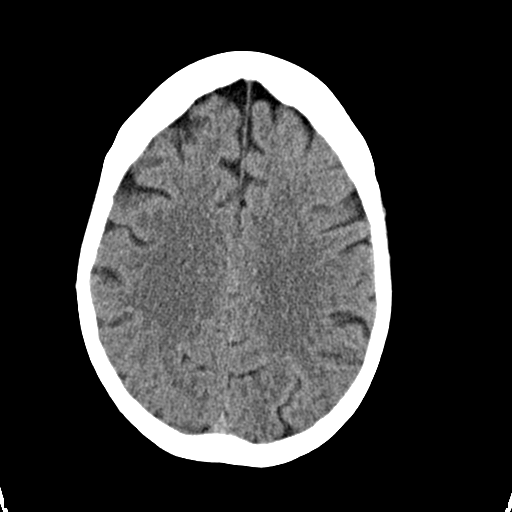
[im 20/30  brain]
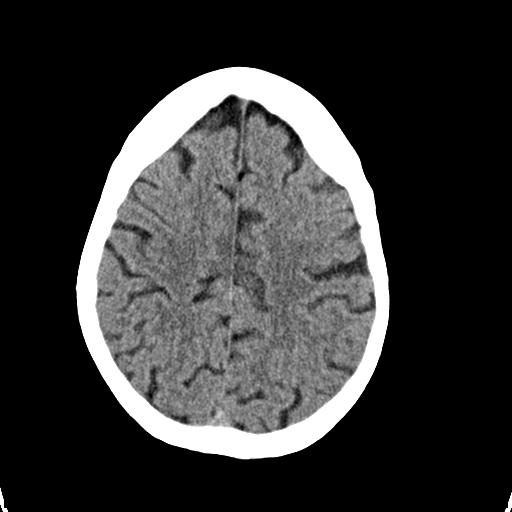
[im 22/30  brain]
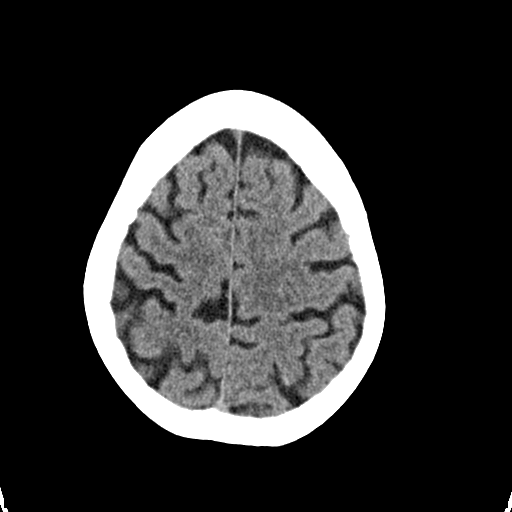
[im 23/30  brain]
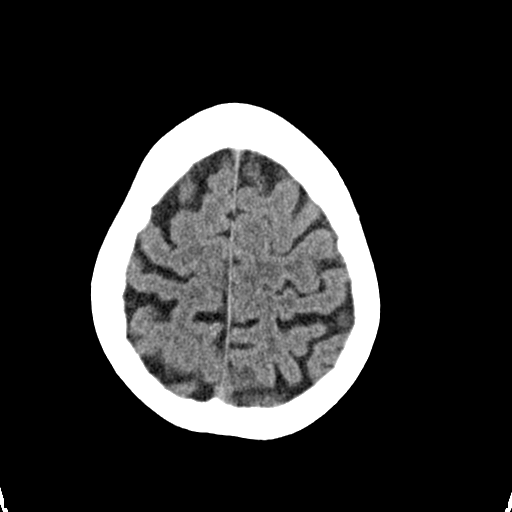
[im 23/30  bone]
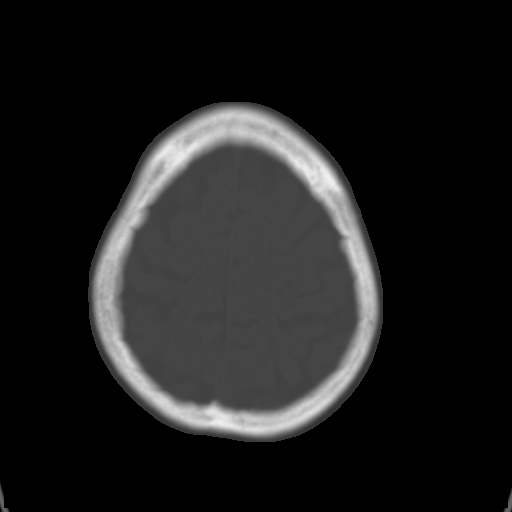
[im 25/30  brain]
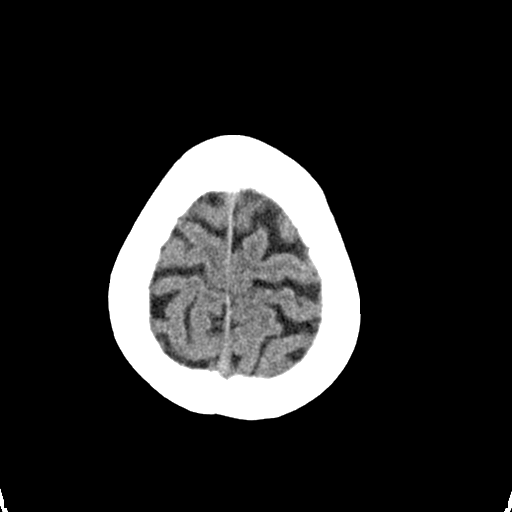
[im 27/30  brain]
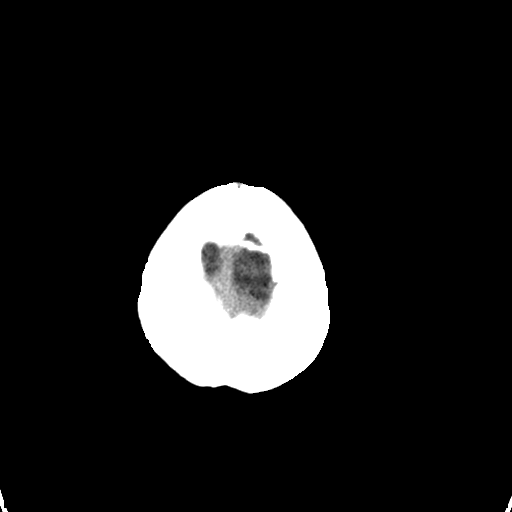
[im 29/30  brain]
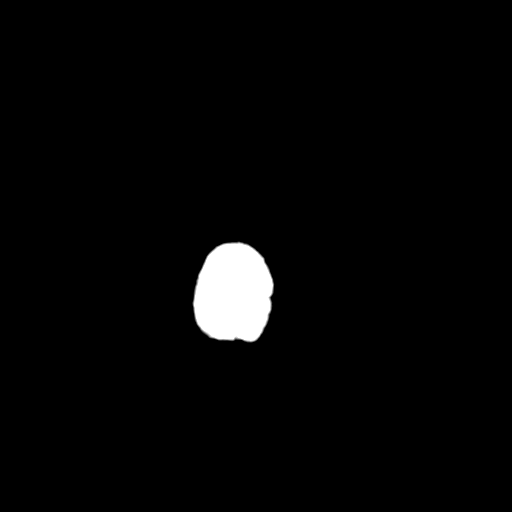

[16 of 30 positions shown; findings below may reference images not displayed]

FINDINGS: No evidence for acute infarction, hemorrhage, mass lesion,
hydrocephalus, or extra-axial fluid. No atrophy or white matter
disease. Slight vascular calcification. Intact calvarium. No acute
sinus or mastoid disease.
IMPRESSION: Negative.

## 2018-03-17 ENCOUNTER — Encounter: Payer: Self-pay | Admitting: Family Medicine

## 2018-03-18 ENCOUNTER — Encounter: Payer: Self-pay | Admitting: Medical

## 2018-03-18 ENCOUNTER — Ambulatory Visit (HOSPITAL_BASED_OUTPATIENT_CLINIC_OR_DEPARTMENT_OTHER)
Admission: RE | Admit: 2018-03-18 | Discharge: 2018-03-18 | Disposition: A | Discharge status: Home / Self Care | Payer: BLUE CROSS/BLUE SHIELD | Source: Ambulatory Visit | Attending: Medical | Admitting: Medical

## 2018-03-18 ENCOUNTER — Ambulatory Visit: Payer: BLUE CROSS/BLUE SHIELD | Admitting: Medical

## 2018-03-18 VITALS — BP 130/70 | HR 90 | Temp 97.9°F | Resp 16 | Ht 65.5 in | Wt 163.0 lb

## 2018-03-18 DIAGNOSIS — R059 Cough, unspecified: Secondary | ICD-10-CM

## 2018-03-18 DIAGNOSIS — J040 Acute laryngitis: Secondary | ICD-10-CM

## 2018-03-18 DIAGNOSIS — J309 Allergic rhinitis, unspecified: Secondary | ICD-10-CM

## 2018-03-18 DIAGNOSIS — J029 Acute pharyngitis, unspecified: Secondary | ICD-10-CM

## 2018-03-18 DIAGNOSIS — R05 Cough: Secondary | ICD-10-CM | POA: Diagnosis not present

## 2018-03-18 MED ORDER — HYDROCODONE-HOMATROPINE 5-1.5 MG/5ML PO SYRP: 5.0000 mL | ORAL_SOLUTION | Freq: Three times a day (TID) | ORAL | 0 refills | Status: DC | PRN

## 2018-03-18 MED ORDER — PREDNISONE 10 MG PO TABS: ORAL_TABLET | ORAL | 0 refills | Status: DC

## 2018-03-18 NOTE — Progress Notes (Signed)
Subjective:    Patient ID: [name] [name], female    DOB: [birth year], 62 y.o.   MRN: [id number]  HPI  Pt in with 11 days of hoarse voice. She states her voice is not recovering. Pt can feel drainage in back of her throat. No sneezing. Pt does have cough that has lingered. When she talks will cough. Pt can sleep. Pt states her cough is dry.   Pt has some nasal congestion. No sinus pain. Pt throat does not hurt.  [facility]. Pt also was Pt was given azithromycin. She has 2 more days of treatment.   Pt is not wheezing. Pt is not as smoker. Never smoked.   Pt states she has allergy years round and takes allegra every day.   Pt has already missed a week of work.  Pt sugars have been 115-125 today.   Review of Systems  Constitutional: Negative for chills, fatigue and fever.  HENT: Positive for congestion, postnasal drip, sore throat and voice change. Negative for sinus pressure and sinus pain.        Mild st now.  Respiratory: Positive for cough. Negative for chest tightness, shortness of breath and wheezing.   Cardiovascular: Negative for chest pain and palpitations.  Gastrointestinal: Negative for abdominal pain.  Musculoskeletal: Negative for back pain.  Skin: Negative for rash.  Hematological: Negative for adenopathy. Does not bruise/bleed easily.  Psychiatric/Behavioral: Negative for behavioral problems and confusion.   Past Medical History:  Diagnosis Date  . Asthma   . Chronic pain disorder    neck and shoulder  . CTS (carpal tunnel syndrome) 01/27/2013   right  . Diabetes mellitus type II   . Diabetic gastroparesis ([place])   . Esophageal reflux 08/25/2013   [place], Dr Barth Kirks  . FATTY LIVER DISEASE 04/27/2007   Qualifier: Diagnosis of  By: Danelle Earthly CMA, Darlene    . Fundic gland polyps of stomach, benign   . HTN (hypertension)   . Hyperlipidemia, mixed 05/04/2007   Qualifier: Diagnosis of  By: Wynona Luna crestor  caused myalgias even at low dose Livalo caused myalgias Lipitor  Mother with severe reaction myalgias Simvastatin, Welchol   . Hypertension 08/24/2010  . Hypokalemia 05/25/2013   Improved stopping HCTZ. Was noted to have an elevated glucose when K was low. Recheck renal next week after starting Maxzide  . Hypomagnesemia 08/10/2014  . Hypothyroidism   . Laryngitis 08/25/2017  . Leg cramps 04/15/2016  . Nausea without vomiting 08/08/2014  . NONSPEC ELEVATION OF LEVELS OF TRANSAMINASE/LDH 05/01/2007   Qualifier: Diagnosis of  By: Garner Gavel    . Osteoarthritis    chronic, right knee (11/04/2017)  . OVARIAN CYST 07/11/2009   Qualifier: Diagnosis of  By: Wynona Luna   . Plantar fasciitis   . Rosacea 10/22/2016     Social History   Socioeconomic History  . Marital status: Married    Spouse name: Not on file  . Number of children: 1  . Years of education: Not on file  . Highest education level: Not on file  Occupational History  . Occupation: IT TECH, admin. assistant    Employer: [employer]  Social Needs  . Financial resource strain: Not on file  . Food insecurity:    Worry: Not on file    Inability: Not on file  . Transportation needs:    Medical: Not on file    Non-medical: Not on file  Tobacco  Use  . Smoking status: Never Smoker  . Smokeless tobacco: Never Used  Substance and Sexual Activity  . Alcohol use: Not Currently  . Drug use: Never  . Sexual activity: Not Currently  Lifestyle  . Physical activity:    Days per week: Not on file    Minutes per session: Not on file  . Stress: Not on file  Relationships  . Social connections:    Talks on phone: Not on file    Gets together: Not on file    Attends religious service: Not on file    Active member of club or organization: Not on file    Attends meetings of clubs or organizations: Not on file    Relationship status: Not on file  . Intimate partner violence:    Fear of current or ex partner: Not on file     Emotionally abused: Not on file    Physically abused: Not on file    Forced sexual activity: Not on file  Other Topics Concern  . Not on file  Social History Narrative   Multiple family members with intolerance to statins.    Past Surgical History:  Procedure Laterality Date  . AUGMENTATION MAMMAPLASTY Bilateral 2002  . COLONOSCOPY    . CORONARY STENT INTERVENTION N/A 11/04/2017   Procedure: CORONARY STENT INTERVENTION;  Surgeon: Nelva Bush, MD;  Location: [place] [facility];  Service: Cardiovascular;  Laterality: N/A;  . ESOPHAGOGASTRODUODENOSCOPY    . INTRAVASCULAR PRESSURE WIRE/FFR STUDY N/A 11/04/2017   Procedure: INTRAVASCULAR PRESSURE WIRE/FFR STUDY;  Surgeon: Nelva Bush, MD;  Location: [place] [facility];  Service: Cardiovascular;  Laterality: N/A;  . INTRAVASCULAR ULTRASOUND/IVUS N/A 11/04/2017   Procedure: Intravascular Ultrasound/IVUS;  Surgeon: Nelva Bush, MD;  Location: [place] [facility];  Service: Cardiovascular;  Laterality: N/A;  . KNEE ARTHROSCOPY  1998, [id number]  . LAPAROSCOPIC CHOLECYSTECTOMY    . LEFT HEART CATH AND CORONARY ANGIOGRAPHY N/A 11/04/2017   Procedure: LEFT HEART CATH AND CORONARY ANGIOGRAPHY;  Surgeon: Nelva Bush, MD;  Location: [place] [facility];  Service: Cardiovascular;  Laterality: N/A;  . LEFT HEART CATHETERIZATION WITH CORONARY ANGIOGRAM N/A 11/21/2011   Procedure: LEFT HEART CATHETERIZATION WITH CORONARY ANGIOGRAM;  Surgeon: Josue Hector, MD;  Location[facility];  Service: Cardiovascular;  Laterality: N/A;  . TONSILLECTOMY  1975  . VAGINAL HYSTERECTOMY  1983   "still have my ovaries"    Family History  Problem Relation Age of Onset  . Alzheimer's disease Mother   . Diabetes type II Mother   . Hiatal hernia Mother   . Diabetes Mother   . Emphysema Father   . COPD Father   . Depression Sister        suicide  . Diabetes Sister   . Diabetes Daughter   . Colon cancer Neg Hx   . Stomach cancer Neg Hx      Allergies  Allergen Reactions  . Epinephrine Other (See Comments)    Pass out   . Statins Other (See Comments)    Myalgias: Simvastatin also caused back ache  . Welchol [Colesevelam Hcl]     myalgia  . Amoxicillin Itching and Rash    Has patient had a PCN reaction causing immediate rash, facial/tongue/throat swelling, SOB or lightheadedness with hypotension: No Has patient had a PCN reaction causing severe rash involving mucus membranes or skin necrosis: No Has patient had a PCN reaction that required hospitalization: No Has patient had a PCN reaction occurring within the last  10 years: Yes If all of the above answers are "NO", then may proceed with Cephalosporin use.     Current Outpatient Medications on File Prior to Visit  Medication Sig Dispense Refill  . ACCU-CHEK FASTCLIX LANCETS MISC Check blood sugar once daily Dz:E11.9 100 each 3  . aspirin 81 MG tablet Take 81 mg by mouth daily.     Marland Kitchen azithromycin (ZITHROMAX) 250 MG tablet     . B-D ULTRAFINE III SHORT PEN 31G X 8 MM MISC INJECT ONCE DAILY 100 each 3  . benzonatate (TESSALON) 100 MG capsule Swallow whole one (185m) capsule by mouth 3 times a day  as needed.Do not break, chew, dissolve, cut or crush.    . Blood Glucose Monitoring Suppl (ACCU-CHEK NANO SMARTVIEW) w/Device KIT Check blood sugar once daily 1 kit 0  . Calcium Carbonate (CALTRATE 600 PO) Take 600 mg by mouth 2 (two) times daily.     . Evolocumab (REPATHA SURECLICK) 1497MG/ML SOAJ Inject 140 mg into the skin every 14 (fourteen) days. 2 pen 12  . FARXIGA 10 MG TABS tablet TAKE 1 TABLET EVERY DAY 90 tablet 3  . fenofibrate 160 MG tablet TAKE 1 TABLET BY MOUTH EVERY DAY 90 tablet 0  . Glucosamine-Chondroitin (OSTEO BI-FLEX REGULAR STRENGTH) 250-200 MG TABS Take 1 tablet by mouth 2 (two) times daily.     .Marland Kitchenglucose blood (ACCU-CHEK SMARTVIEW) test strip Check blood sugars once daily 100 each 12  . Krill Oil 350 MG CAPS Take 350 mg by mouth daily.     .Marland Kitchen levothyroxine (SYNTHROID, LEVOTHROID) 88 MCG tablet 1 tab po daily except on Tues and Sat take 2 tabs 90 tablet 0  . Magnesium 250 MG TABS Take 250-500 mg by mouth See admin instructions. Take 2549mby mouth Sunday, Tuesday, Thursday and Saturday and 50032monday, Wednesday and Friday    . metFORMIN (GLUCOPHAGE) 500 MG tablet Take 500 mg by mouth 2 (two) times daily with a meal. TAKE 1 TABLET BY MOUTH 2 TIMES A DAY    . metoprolol tartrate (LOPRESSOR) 25 MG tablet Take 1 tablet (25 mg total) by mouth 2 (two) times daily. 180 tablet 3  . nitroGLYCERIN (NITROSTAT) 0.4 MG SL tablet Place 1 tablet (0.4 mg total) under the tongue every 5 (five) minutes as needed for chest pain. 50 tablet 3  . pantoprazole (PROTONIX) 40 MG tablet Take 1 tablet (40 mg total) by mouth daily. 90 tablet 1  . pioglitazone (ACTOS) 30 MG tablet TAKE 1 TABLET BY MOUTH EVERY DAY 90 tablet 3  . POTASSIUM CITRATE PO Take 1 tablet by mouth daily.    . Probiotic Product (PROBIOTIC DAILY PO) Take 99 mg by mouth daily.     . repaglinide (PRANDIN) 0.5 MG tablet TAKE 1 TABLET (0.5 MG TOTAL) BY MOUTH DAILY WITH SUPPER. 90 tablet 0  . ticagrelor (BRILINTA) 90 MG TABS tablet Take 1 tablet (90 mg total) by mouth 2 (two) times daily. 180 tablet 3  . triamterene-hydrochlorothiazide (MAXZIDE-25) 37.5-25 MG tablet TAKE 1/2 TABLET BY MOUTH EVERY DAY 45 tablet 3  . VICTOZA 18 MG/3ML SOPN INJECT 1.8MG(0.3ML) DAILY FOR 90 DAYS 9 mL 3  . diltiazem (CARDIZEM CD) 240 MG 24 hr capsule Take 1 capsule (240 mg total) by mouth daily. 90 capsule 3   No current facility-administered medications on file prior to visit.     BP 130/70   Pulse 90   Temp 97.9 F (36.6 C) (Oral)   Resp 16  Ht 5' 5.5" (1.664 m)   Wt 163 lb (73.9 kg)   SpO2 100%   BMI 26.71 kg/m       Objective:   Physical Exam  General  Mental Status - Alert. General Appearance - Well groomed. Not in acute distress. Hoarse voice.  Skin Rashes- No Rashes.  HEENT Head-  Normal. Ear Auditory Canal - Left- Normal. Right - Normal.Tympanic Membrane- Left- Normal. Right- Normal. Eye Sclera/Conjunctiva- Left- Normal. Right- Normal. Nose & Sinuses Nasal Mucosa- Left-  Boggy and Congested. Right-  Boggy and  Congested.Bilateral no maxillary and no  frontal sinus pressure. Mouth & Throat Lips: Upper Lip- Normal: no dryness, cracking, pallor, cyanosis, or vesicular eruption. Lower Lip-Normal: no dryness, cracking, pallor, cyanosis or vesicular eruption. Buccal Mucosa- Bilateral- No Aphthous ulcers. Oropharynx- No Discharge or Erythema. +pnd Tonsils: Characteristics- Bilateral- No Erythema or Congestion. Size/Enlargement- Bilateral- No enlargement. Discharge- bilateral-None.  Neck Neck- Supple. No Masses.   Chest and Lung Exam Auscultation: Breath Sounds:-Clear even and unlabored.  Cardiovascular Auscultation:Rythm- Regular, rate and rhythm. Murmurs & Other Heart Sounds:Ausculatation of the heart reveal- No Murmurs.  Lymphatic Head & Neck General Head & Neck Lymphatics: Bilateral: Description- No Localized lymphadenopathy.       Assessment & Plan:  You do appear to have combination of allergic rhinitis symptoms, laryngitis and red pharynx/possible strep throat.  Your are already on antibiotic azithromycin and will recommend that she continue that.  Would advise that she continue Allegra and I do think he would benefit from a 4-day taper dose of prednisone.  This is a low dose and I think you will get benefit from this with minimal breast tissue elevating your blood sugar.  Continue diabetic medications and a low sugar diet.  If sugar spike over 200 please let me know.  Since cough is lingered for 11 days, I do think it is a good idea to get a chest x-ray. Your rapid strep test was negative today.  For the persisting cough, I am sending Hycodan cough  to your pharmacy.  Prescription was given today regarding sedation side effects.  If you want to try  Hycodan at work recommend using it when you arrive at work and not using dose before you drive home from work. You could also compare benzonatate 237m max dose for which medicine suppresses cough better.  If cough persist despite above measures then consider referral to ENT.  Also evaluation for persisting laryngitis may be beneficial.  Follow-up in 7 to 10 days was needed.  EMackie Pai PA-C

## 2018-03-18 NOTE — Patient Instructions (Addendum)
You do appear to have combination of allergic rhinitis symptoms, laryngitis and red pharynx/possible strep throat.  Your are already on antibiotic azithromycin and will recommend that she continue that.  Would advise that she continue Allegra and I do think he would benefit from a 4-day taper dose of prednisone.  This is a low dose and I think you will get benefit from this with minimal breast tissue elevating your blood sugar.  Continue diabetic medications and a low sugar diet.  If sugar spike over 200 please let me know.  Since cough is lingered for 11 days, I do think it is a good idea to get a chest x-ray. Your rapid strep test was negative today.  For the persisting cough, I am sending Hycodan cough  to your pharmacy.  Prescription was given today regarding sedation side effects.  If you want to try Hycodan at work recommend using it when you arrive at work and not using dose before you drive home from work. You could also compare benzonatate 200mg  max dose for which medicine suppresses cough better.  If cough persist despite above measures then consider referral to ENT.  Also evaluation for persisting laryngitis may be beneficial.  Follow-up in 7 to 10 days was needed.

## 2018-03-23 ENCOUNTER — Encounter: Payer: Self-pay | Admitting: Family Medicine

## 2018-03-23 ENCOUNTER — Other Ambulatory Visit: Payer: Self-pay | Admitting: Family Medicine

## 2018-03-23 DIAGNOSIS — R490 Dysphonia: Secondary | ICD-10-CM

## 2018-03-31 ENCOUNTER — Ambulatory Visit: Payer: BLUE CROSS/BLUE SHIELD | Admitting: Endocrinology

## 2018-03-31 ENCOUNTER — Encounter: Payer: Self-pay | Admitting: Endocrinology

## 2018-03-31 VITALS — BP 120/60 | HR 75 | Ht 65.5 in | Wt 165.2 lb

## 2018-03-31 DIAGNOSIS — Z23 Encounter for immunization: Secondary | ICD-10-CM | POA: Diagnosis not present

## 2018-03-31 DIAGNOSIS — E1143 Type 2 diabetes mellitus with diabetic autonomic (poly)neuropathy: Secondary | ICD-10-CM

## 2018-03-31 LAB — POCT GLYCOSYLATED HEMOGLOBIN (HGB A1C): HEMOGLOBIN A1C: 6.1 % — AB (ref 4.0–5.6)

## 2018-03-31 MED ORDER — METFORMIN HCL 500 MG PO TABS: 500.0000 mg | ORAL_TABLET | Freq: Every day | ORAL | 3 refills | Status: DC

## 2018-03-31 NOTE — Progress Notes (Signed)
Subjective:    Patient ID: [name] [name], female    DOB: Dec 19, 1955, 62 y.o.   MRN: [id number]  HPI Pt returns for f/u of diabetes mellitus: DM type: 2 Dx'ed: 3300 Complications: gastroparesis and renal insuff Therapy: 4 oral meds and victoza.  GDM: never DKA: never.  Severe hypoglycemia: never.  Pancreatitis: never.  Other: she has never been on insulin; diarrhea and renal insuff limits metformin dosage; she did not tolerate parlodel (vomiting).   Interval history: She does not miss med doses.  pt states she feels well in general.  Past Medical History:  Diagnosis Date  . Asthma   . Chronic pain disorder    neck and shoulder  . CTS (carpal tunnel syndrome) 01/27/2013   right  . Diabetes mellitus type II   . Diabetic gastroparesis ([place])   . Esophageal reflux 08/25/2013   [facility] [place], Dr Barth Kirks  . FATTY LIVER DISEASE 04/27/2007   Qualifier: Diagnosis of  By: Danelle Earthly CMA, Darlene    . Fundic gland polyps of stomach, benign   . HTN (hypertension)   . Hyperlipidemia, mixed 05/04/2007   Qualifier: Diagnosis of  By: Wynona Luna crestor caused myalgias even at low dose Livalo caused myalgias Lipitor  Mother with severe reaction myalgias Simvastatin, Welchol   . Hypertension 08/24/2010  . Hypokalemia 05/25/2013   Improved stopping HCTZ. Was noted to have an elevated glucose when K was low. Recheck renal next week after starting Maxzide  . Hypomagnesemia 08/10/2014  . Hypothyroidism   . Laryngitis 08/25/2017  . Leg cramps 04/15/2016  . Nausea without vomiting 08/08/2014  . NONSPEC ELEVATION OF LEVELS OF TRANSAMINASE/LDH 05/01/2007   Qualifier: Diagnosis of  By: Garner Gavel    . Osteoarthritis    chronic, right knee (11/04/2017)  . OVARIAN CYST 07/11/2009   Qualifier: Diagnosis of  By: Wynona Luna   . Plantar fasciitis   . Rosacea 10/22/2016    Past Surgical History:  Procedure Laterality Date  . AUGMENTATION MAMMAPLASTY Bilateral 2002  . COLONOSCOPY    .  CORONARY STENT INTERVENTION N/A 11/04/2017   Procedure: CORONARY STENT INTERVENTION;  Surgeon: Nelva Bush, MD;  Location: [facility];  Service: Cardiovascular;  Laterality: N/A;  . ESOPHAGOGASTRODUODENOSCOPY    . INTRAVASCULAR PRESSURE WIRE/FFR STUDY N/A 11/04/2017   Procedure: INTRAVASCULAR PRESSURE WIRE/FFR STUDY;  Surgeon: Nelva Bush, MD;  Location: [place] [facility];  Service: Cardiovascular;  Laterality: N/A;  . INTRAVASCULAR ULTRASOUND/IVUS N/A 11/04/2017   Procedure: Intravascular Ultrasound/IVUS;  Surgeon: Nelva Bush, MD;  Location: [facility];  Service: Cardiovascular;  Laterality: N/A;  . KNEE ARTHROSCOPY  1998, [id number]  . LAPAROSCOPIC CHOLECYSTECTOMY    . LEFT HEART CATH AND CORONARY ANGIOGRAPHY N/A 11/04/2017   Procedure: LEFT HEART CATH AND CORONARY ANGIOGRAPHY;  Surgeon: Nelva Bush, MD;  Location: [place];  Service: Cardiovascular;  Laterality: N/A;  . LEFT HEART CATHETERIZATION WITH CORONARY ANGIOGRAM N/A 11/21/2011   Procedure: LEFT HEART CATHETERIZATION WITH CORONARY ANGIOGRAM;  Surgeon: Josue Hector, MD;  Location: [facility];  Service: Cardiovascular;  Laterality: N/A;  . TONSILLECTOMY  1975  . VAGINAL HYSTERECTOMY  1983   "still have my ovaries"    Social History   Socioeconomic History  . Marital status: Married    Spouse name: Not on file  . Number of children: 1  . Years of education: Not on file  . Highest education level: Not on file  Occupational History  .  Occupation: IT TECH, admin. assistant    Employer: [employer]  Social Needs  . Financial resource strain: Not on file  . Food insecurity:    Worry: Not on file    Inability: Not on file  . Transportation needs:    Medical: Not on file    Non-medical: Not on file  Tobacco Use  . Smoking status: Never Smoker  . Smokeless tobacco: Never Used  Substance and Sexual Activity  . Alcohol use: Not Currently  . Drug use: Never  . Sexual activity: Not  Currently  Lifestyle  . Physical activity:    Days per week: Not on file    Minutes per session: Not on file  . Stress: Not on file  Relationships  . Social connections:    Talks on phone: Not on file    Gets together: Not on file    Attends religious service: Not on file    Active member of club or organization: Not on file    Attends meetings of clubs or organizations: Not on file    Relationship status: Not on file  . Intimate partner violence:    Fear of current or ex partner: Not on file    Emotionally abused: Not on file    Physically abused: Not on file    Forced sexual activity: Not on file  Other Topics Concern  . Not on file  Social History Narrative   Multiple family members with intolerance to statins.    Current Outpatient Medications on File Prior to Visit  Medication Sig Dispense Refill  . ACCU-CHEK FASTCLIX LANCETS MISC Check blood sugar once daily Dz:E11.9 100 each 3  . aspirin 81 MG tablet Take 81 mg by mouth daily.     . B-D ULTRAFINE III SHORT PEN 31G X 8 MM MISC INJECT ONCE DAILY 100 each 3  . Blood Glucose Monitoring Suppl (ACCU-CHEK NANO SMARTVIEW) w/Device KIT Check blood sugar once daily 1 kit 0  . Calcium Carbonate (CALTRATE 600 PO) Take 600 mg by mouth 2 (two) times daily.     . Evolocumab (REPATHA SURECLICK) 914 MG/ML SOAJ Inject 140 mg into the skin every 14 (fourteen) days. 2 pen 12  . FARXIGA 10 MG TABS tablet TAKE 1 TABLET EVERY DAY 90 tablet 3  . fenofibrate 160 MG tablet TAKE 1 TABLET BY MOUTH EVERY DAY 90 tablet 0  . Glucosamine-Chondroitin (OSTEO BI-FLEX REGULAR STRENGTH) 250-200 MG TABS Take 1 tablet by mouth 2 (two) times daily.     Marland Kitchen glucose blood (ACCU-CHEK SMARTVIEW) test strip Check blood sugars once daily 100 each 12  . Krill Oil 350 MG CAPS Take 350 mg by mouth daily.     Marland Kitchen levothyroxine (SYNTHROID, LEVOTHROID) 88 MCG tablet 1 tab po daily except on Tues and Sat take 2 tabs 90 tablet 0  . Magnesium 250 MG TABS Take 250-500 mg by mouth  See admin instructions. Take 233m by mouth Sunday, Tuesday, Thursday and Saturday and 5069mMonday, Wednesday and Friday    . metoprolol tartrate (LOPRESSOR) 25 MG tablet Take 1 tablet (25 mg total) by mouth 2 (two) times daily. 180 tablet 3  . nitroGLYCERIN (NITROSTAT) 0.4 MG SL tablet Place 1 tablet (0.4 mg total) under the tongue every 5 (five) minutes as needed for chest pain. 50 tablet 3  . pantoprazole (PROTONIX) 40 MG tablet Take 1 tablet (40 mg total) by mouth daily. 90 tablet 1  . pioglitazone (ACTOS) 30 MG tablet TAKE 1 TABLET BY MOUTH EVERY  DAY 90 tablet 3  . POTASSIUM CITRATE PO Take 1 tablet by mouth daily.    . Probiotic Product (PROBIOTIC DAILY PO) Take 99 mg by mouth daily.     . repaglinide (PRANDIN) 0.5 MG tablet TAKE 1 TABLET (0.5 MG TOTAL) BY MOUTH DAILY WITH SUPPER. 90 tablet 0  . ticagrelor (BRILINTA) 90 MG TABS tablet Take 1 tablet (90 mg total) by mouth 2 (two) times daily. 180 tablet 3  . triamterene-hydrochlorothiazide (MAXZIDE-25) 37.5-25 MG tablet TAKE 1/2 TABLET BY MOUTH EVERY DAY 45 tablet 3  . VICTOZA 18 MG/3ML SOPN INJECT 1.8MG(0.3ML) DAILY FOR 90 DAYS 9 mL 3  . diltiazem (CARDIZEM CD) 240 MG 24 hr capsule Take 1 capsule (240 mg total) by mouth daily. 90 capsule 3   No current facility-administered medications on file prior to visit.     Allergies  Allergen Reactions  . Epinephrine Other (See Comments)    Pass out   . Statins Other (See Comments)    Myalgias: Simvastatin also caused back ache  . Welchol [Colesevelam Hcl]     myalgia  . Amoxicillin Itching and Rash    Has patient had a PCN reaction causing immediate rash, facial/tongue/throat swelling, SOB or lightheadedness with hypotension: No Has patient had a PCN reaction causing severe rash involving mucus membranes or skin necrosis: No Has patient had a PCN reaction that required hospitalization: No Has patient had a PCN reaction occurring within the last 10 years: Yes If all of the above answers are  "NO", then may proceed with Cephalosporin use.     Family History  Problem Relation Age of Onset  . Alzheimer's disease Mother   . Diabetes type II Mother   . Hiatal hernia Mother   . Diabetes Mother   . Emphysema Father   . COPD Father   . Depression Sister        suicide  . Diabetes Sister   . Diabetes Daughter   . Colon cancer Neg Hx   . Stomach cancer Neg Hx     BP 120/60 (BP Location: Left Arm, Patient Position: Sitting, Cuff Size: Normal)   Pulse 75   Ht 5' 5.5" (1.664 m)   Wt 165 lb 3.2 oz (74.9 kg)   SpO2 99%   BMI 27.07 kg/m    Review of Systems She denies hypoglycemia    Objective:   Physical Exam VITAL SIGNS:  See vs page GENERAL: no distress Pulses: dorsalis pedis intact bilat.   MSK: no deformity of the feet.   CV: trace bilat leg edema.   Skin:  no ulcer on the feet.  normal color and temp on the feet.  Neuro: sensation is intact to touch on the feet.    Lab Results  Component Value Date   HGBA1C 6.1 (A) 03/31/2018   Lab Results  Component Value Date   CREATININE 1.24 (H) 11/10/2017   BUN 26 (H) 11/10/2017   NA 138 11/10/2017   K 3.9 11/10/2017   CL 101 11/10/2017   CO2 25 11/10/2017        Assessment & Plan:  Type 2 DM, with gastroparesis: well-controlled Renal insuff: we need to reduce metformin  Patient Instructions  check your blood sugar once a day.  vary the time of day when you check, between before the 3 meals, and at bedtime.  also check if you have symptoms of your blood sugar being too high or too low.  please keep a record of the readings and bring  it to your next appointment here.  You can write it on any piece of paper.  please call us sooner if your blood sugar goes below 70, or if you have a lot of readings over 200.   Please reduce the metformin to 1 pill per day.  Please continue the same other diabetes medications.  Please come back for a follow-up appointment in 4 months.

## 2018-03-31 NOTE — Patient Instructions (Signed)
check your blood sugar once a day.  vary the time of day when you check, between before the 3 meals, and at bedtime.  also check if you have symptoms of your blood sugar being too high or too low.  please keep a record of the readings and bring it to your next appointment here.  You can write it on any piece of paper.  please call us sooner if your blood sugar goes below 70, or if you have a lot of readings over 200.   Please reduce the metformin to 1 pill per day.  Please continue the same other diabetes medications.  Please come back for a follow-up appointment in 4 months.

## 2018-04-13 DIAGNOSIS — R198 Other specified symptoms and signs involving the digestive system and abdomen: Secondary | ICD-10-CM | POA: Insufficient documentation

## 2018-04-13 DIAGNOSIS — R0989 Other specified symptoms and signs involving the circulatory and respiratory systems: Secondary | ICD-10-CM | POA: Insufficient documentation

## 2018-04-13 DIAGNOSIS — R09A2 Foreign body sensation, throat: Secondary | ICD-10-CM

## 2018-04-13 HISTORY — DX: Other specified symptoms and signs involving the digestive system and abdomen: R19.8

## 2018-04-13 HISTORY — DX: Other specified symptoms and signs involving the circulatory and respiratory systems: R09.89

## 2018-04-13 HISTORY — DX: Foreign body sensation, throat: R09.A2

## 2018-04-21 LAB — HM DIABETES EYE EXAM

## 2018-05-01 ENCOUNTER — Ambulatory Visit: Payer: BLUE CROSS/BLUE SHIELD | Admitting: Family Medicine

## 2018-05-12 ENCOUNTER — Ambulatory Visit: Payer: BLUE CROSS/BLUE SHIELD | Admitting: Family Medicine

## 2018-05-14 ENCOUNTER — Encounter: Payer: Self-pay | Admitting: Family Medicine

## 2018-05-15 ENCOUNTER — Encounter: Payer: Self-pay | Admitting: Family Medicine

## 2018-05-19 ENCOUNTER — Ambulatory Visit
Admission: RE | Admit: 2018-05-19 | Discharge: 2018-05-19 | Disposition: A | Discharge status: Home / Self Care | Payer: BLUE CROSS/BLUE SHIELD | Source: Ambulatory Visit | Attending: Family Medicine | Admitting: Family Medicine

## 2018-05-19 DIAGNOSIS — Z1231 Encounter for screening mammogram for malignant neoplasm of breast: Secondary | ICD-10-CM

## 2018-05-21 ENCOUNTER — Telehealth: Payer: Self-pay

## 2018-05-21 NOTE — Telephone Encounter (Signed)
OK with me.

## 2018-05-21 NOTE — Telephone Encounter (Signed)
Copied from [place] [id number]. Topic: Appointment Scheduling - Transfer of Care >> May 21, 2018 11:24 AM [name] [name] wrote: Pt is requesting to transfer FROM: [name] [name] Pt is requesting to transfer TO: [name] [name]  Reason for requested transfer: Do to location , she live in [place] and would like to transfer to this office   Send CRM to patient's current PCP (transferring FROM).

## 2018-05-26 ENCOUNTER — Ambulatory Visit: Payer: BLUE CROSS/BLUE SHIELD | Admitting: Family Medicine

## 2018-05-29 NOTE — Telephone Encounter (Signed)
LM for patient to call to schedule TOC appointment.

## 2018-06-01 ENCOUNTER — Encounter: Payer: Self-pay | Admitting: Internal Medicine

## 2018-06-01 ENCOUNTER — Other Ambulatory Visit: Payer: Self-pay | Admitting: Endocrinology

## 2018-06-03 ENCOUNTER — Encounter: Payer: Self-pay | Admitting: Family Medicine

## 2018-06-03 ENCOUNTER — Ambulatory Visit: Payer: BLUE CROSS/BLUE SHIELD | Admitting: Family Medicine

## 2018-06-03 VITALS — BP 108/69 | HR 73 | Temp 97.6°F | Resp 16 | Ht 65.0 in | Wt 162.1 lb

## 2018-06-03 DIAGNOSIS — E039 Hypothyroidism, unspecified: Secondary | ICD-10-CM | POA: Diagnosis not present

## 2018-06-03 DIAGNOSIS — D509 Iron deficiency anemia, unspecified: Secondary | ICD-10-CM | POA: Diagnosis not present

## 2018-06-03 DIAGNOSIS — Z9861 Coronary angioplasty status: Secondary | ICD-10-CM

## 2018-06-03 DIAGNOSIS — I10 Essential (primary) hypertension: Secondary | ICD-10-CM | POA: Diagnosis not present

## 2018-06-03 DIAGNOSIS — R112 Nausea with vomiting, unspecified: Secondary | ICD-10-CM

## 2018-06-03 DIAGNOSIS — E785 Hyperlipidemia, unspecified: Secondary | ICD-10-CM | POA: Diagnosis not present

## 2018-06-03 DIAGNOSIS — R109 Unspecified abdominal pain: Secondary | ICD-10-CM

## 2018-06-03 DIAGNOSIS — K219 Gastro-esophageal reflux disease without esophagitis: Secondary | ICD-10-CM

## 2018-06-03 DIAGNOSIS — E663 Overweight: Secondary | ICD-10-CM | POA: Diagnosis not present

## 2018-06-03 DIAGNOSIS — E119 Type 2 diabetes mellitus without complications: Secondary | ICD-10-CM | POA: Diagnosis not present

## 2018-06-03 DIAGNOSIS — L659 Nonscarring hair loss, unspecified: Secondary | ICD-10-CM

## 2018-06-03 DIAGNOSIS — I2510 Atherosclerotic heart disease of native coronary artery without angina pectoris: Secondary | ICD-10-CM

## 2018-06-03 NOTE — Patient Instructions (Signed)
It was nice to meet you today.  We will start with testing your labs.   We will call you with labs results.   Please help [place] help you:  We are honored you have chosen [facility] for your Primary Care home. Below you will find basic instructions that you may need to access in the future. Please help [place] help you by reading the instructions, which cover many of the frequent questions we experience.   Prescription refills and request:  -In order to allow more efficient response time, please call your pharmacy for all refills. They will forward the request electronically to [place]. This allows for the quickest possible response. Request left on a nurse line can take longer to refill, since these are checked as time allows between office patients and other phone calls.  - refill request can take up to 3-5 working days to complete.  - If request is sent electronically and request is appropiate, it is usually completed in 1-2 business days.  - all patients will need to be seen routinely for all chronic medical conditions requiring prescription medications (see follow-up below). If you are overdue for follow up on your condition, you will be asked to make an appointment and we will call in enough medication to cover you until your appointment (up to 30 days).  - all controlled substances will require a face to face visit to request/refill.  - if you desire your prescriptions to go through a new pharmacy, and have an active script at original pharmacy, you will need to call your pharmacy and have scripts transferred to new pharmacy. This is completed between the pharmacy locations and not by your provider.    Results: If any images or labs were ordered, it can take up to 1 week to get results depending on the test ordered and the lab/facility running and resulting the test. - Normal or stable results, which do not need further discussion, may be released to your mychart immediately with attached note to  you. A call may not be generated for normal results. Please make certain to sign up for mychart. If you have questions on how to activate your mychart you can call the front office.  - If your results need further discussion, our office will attempt to contact you via phone, and if unable to reach you after 2 attempts, we will release your abnormal result to your mychart with instructions.  - All results will be automatically released in mychart after 1 week.  - Your provider will provide you with explanation and instruction on all relevant material in your results. Please keep in mind, results and labs may appear confusing or abnormal to the untrained eye, but it does not mean they are actually abnormal for you personally. If you have any questions about your results that are not covered, or you desire more detailed explanation than what was provided, you should make an appointment with your provider to do so.   Our office handles many outgoing and incoming calls daily. If we have not contacted you within 1 week about your results, please check your mychart to see if there is a message first and if not, then contact our office.  In helping with this matter, you help decrease call volume, and therefore allow [place] to be able to respond to patients needs more efficiently.   Acute office visits (sick visit):  An acute visit is intended for a new problem and are scheduled in shorter time  slots to allow schedule openings for patients with new problems. This is the appropriate visit to discuss a new problem. Problems will not be addressed by phone call or Echart message. Appointment is needed if requesting treatment. In order to provide you with excellent quality medical care with proper time for you to explain your problem, have an exam and receive treatment with instructions, these appointments should be limited to one new problem per visit. If you experience a new problem, in which you desire to be addressed,  please make an acute office visit, we save openings on the schedule to accommodate you. Please do not save your new problem for any other type of visit, let us take care of it properly and quickly for you.   Follow up visits:  Depending on your condition(s) your provider will need to see you routinely in order to provide you with quality care and prescribe medication(s). Most chronic conditions (Example: hypertension, Diabetes, depression/anxiety... etc), require visits a couple times a year. Your provider will instruct you on proper follow up for your personal medical conditions and history. Please make certain to make follow up appointments for your condition as instructed. Failing to do so could result in lapse in your medication treatment/refills. If you request a refill, and are overdue to be seen on a condition, we will always provide you with a 30 day script (once) to allow you time to schedule.    Medicare wellness (well visit): - we have a wonderful Nurse Maudie Mercury), that will meet with you and provide you will yearly medicare wellness visits. These visits should occur yearly (can not be scheduled less than 1 calendar year apart) and cover preventive health, immunizations, advance directives and screenings you are entitled to yearly through your medicare benefits. Do not miss out on your entitled benefits, this is when medicare will pay for these benefits to be ordered for you.  These are strongly encouraged by your provider and is the appropriate type of visit to make certain you are up to date with all preventive health benefits. If you have not had your medicare wellness exam in the last 12 months, please make certain to schedule one by calling the office and schedule your medicare wellness with Maudie Mercury as soon as possible.   Yearly physical (well visit):  - Adults are recommended to be seen yearly for physicals. Check with your insurance and date of your last physical, most insurances require one  calendar year between physicals. Physicals include all preventive health topics, screenings, medical exam and labs that are appropriate for gender/age and history. You may have fasting labs needed at this visit. This is a well visit (not a sick visit), new problems should not be covered during this visit (see acute visit).  - Pediatric patients are seen more frequently when they are younger. Your provider will advise you on well child visit timing that is appropriate for your their age. - This is not a medicare wellness visit. Medicare wellness exams do not have an exam portion to the visit. Some medicare companies allow for a physical, some do not allow a yearly physical. If your medicare allows a yearly physical you can schedule the medicare wellness with our nurse Maudie Mercury and have your physical with your provider after, on the same day. Please check with insurance for your full benefits.   Late Policy/No Shows:  - all new patients should arrive 15-30 minutes earlier than appointment to allow [place] time  to  obtain all personal  demographics,  insurance information and for you to complete office paperwork. - All established patients should arrive 10-15 minutes earlier than appointment time to update all information and be checked in .  - In our best efforts to run on time, if you are late for your appointment you will be asked to either reschedule or if able, we will work you back into the schedule. There will be a wait time to work you back in the schedule,  depending on availability.  - If you are unable to make it to your appointment as scheduled, please call 24 hours ahead of time to allow [place] to fill the time slot with someone else who needs to be seen. If you do not cancel your appointment ahead of time, you may be charged a no show fee.

## 2018-06-03 NOTE — Progress Notes (Signed)
Patient ID: [name] [name], female  DOB: December 26, 1955, 63 y.o.   MRN: [id number] Patient Care Team    Relationship Specialty Notifications Start End  Ma Hillock, DO PCP - General Family Medicine  06/03/18   Minus Breeding, MD PCP - Cardiology Cardiology Admissions 11/14/17   Gatha Mayer, MD Consulting Physician Gastroenterology  06/05/18   Marshell Garfinkel, MD Consulting Physician Pulmonary Disease  06/05/18   Renato Shin, MD Consulting Physician Endocrinology  06/05/18     Chief Complaint  Patient presents with  . Transfer of Care    from Dr. Charlett Blake  . Follow-up    [facility]    Subjective:  [name] [name] is a 63 y.o.  female present for TOC. All past medical history, surgical history, allergies, family history, immunizations, medications and social history were updated in the electronic medical record today. All recent labs, ED visits and hospitalizations within the last year were reviewed.  Hyperlipidemia LDL goal <70/Essential hypertension/Overweight (BMI 25.0-29.9)/coronary artery disease/status post stent placement. Pt reports compliance with baby aspirin, Brilinta, Cardizem, fenofibrate, Krill oil, metoprolol 25 mg twice daily and Maxide. Blood pressures ranges at home in normal limits. Patient denies chest pain, shortness of breath or lower extremity edema.  Pt is to tolerate any cholesterol lowering medication outside of fenofibrate.  She had dyspnea with normal echo and a coronary CTA was abnormal showing LAD disease. Cath done 11/04/17 showed 60% LAD, FFR was significant and she underwent PCI with DES. She had mild scattered disease elsewhere.  Follows with Dr. Percival Spanish.  Gastroesophageal reflux disease without esophagitis Is with Dr. Carlean Purl, currently prescribed Pepcid and Protonix.  Non-insulin dependent type 2 diabetes mellitus ([place]) Treatment is managed by Dr. Loanne Drilling.  Currently prescribed FARXIGA, metformin, Actos, Prandin, Victoza by med list.  Last A1c 6.1, well  controlled. Last eye exam 04/21/2018. Urine albumin collected today. Last foot exam 03/31/2018. Pneumonia vaccination completed 2016.   Iron deficiency anemia, unspecified iron deficiency anemia type Reports history of iron deficiency anemia, not taking any supplementation.   Acquired hypothyroidism TSH mildly over supplemented, patient's was altered at that appointment to levothyroxine 88 mcg daily, with 2 tabs on Sunday.  retest has not been completed.   Nausea and vomiting, intractability of vomiting not specified, unspecified vomiting type/Abdominal discomfort/hair thinning Patient presents today with an acute issue of nausea daily with vomiting every other day since New Year's Day.  She denies any fevers, chills or diarrhea.  She endorses very mild epigastric discomfort.  Her chart she has a medical history of gastroparesis, however there is a normal gastric emptying study 2013.  Her diabetes is well controlled with last A1c of 6.1.  He denies any medication changes.  She denies any exposure to viral illnesses that she is aware of.  She denies any association to food consumption or content to her nausea and vomiting.  She has had a cholecystectomy.  She reports she is feeling fatigued and she feels like she has become more ill over the last few months.  She feels her immunity is down.  She endorses hair thinning.  She endorses fatigue.  She underwent recent cardiac stent procedure November 04, 2017.  Has a history of iron deficiency anemia currently not on supplementation.  She has a history of GERD.  Depression screen [facility] 2/9 06/03/2018 09/06/2014  Decreased Interest 2 0  Down, Depressed, Hopeless 1 0  PHQ - 2 Score 3 0  Altered sleeping 0 -  Tired, decreased energy  2 -  Change in appetite 3 -  Feeling bad or failure about yourself  0 -  Trouble concentrating 0 -  Moving slowly or fidgety/restless 0 -  Suicidal thoughts 0 -  PHQ-9 Score 8 -  Some recent data might be hidden   GAD 7 :  Generalized Anxiety Score 06/03/2018  Nervous, Anxious, on Edge 1  Control/stop worrying 0  Worry too much - different things 0  Trouble relaxing 0  Restless 0  Easily annoyed or irritable 0  Afraid - awful might happen 0  Total GAD 7 Score 1  Anxiety Difficulty Not difficult at all       Fall Risk  09/06/2014  Falls in the past year? No    Immunization History  Administered Date(s) Administered  . Influenza Split 03/27/2011, 04/08/2012  . Influenza Whole 03/14/2009, 02/06/2010  . Influenza,inj,Quad PF,6+ Mos 01/27/2013, 05/02/2014, 04/12/2017, 03/31/2018  . Pneumococcal Polysaccharide-23 03/28/2008  . Tdap 09/18/2011  . Zoster 04/15/2016    No exam data present  Past Medical History:  Diagnosis Date  . Asthma   . Chronic pain disorder    neck and shoulder  . CTS (carpal tunnel syndrome) 01/27/2013   right  . Diabetes mellitus type II   . Diabetic gastroparesis ([place])   . Esophageal reflux 08/25/2013   [place], Dr Barth Kirks  . FATTY LIVER DISEASE 04/27/2007   Qualifier: Diagnosis of  By: Danelle Earthly CMA, Darlene    . Fundic gland polyps of stomach, benign   . HTN (hypertension)   . Hyperlipidemia, mixed 05/04/2007   Qualifier: Diagnosis of  By: Wynona Luna crestor caused myalgias even at low dose Livalo caused myalgias Lipitor  Mother with severe reaction myalgias Simvastatin, Welchol   . Hypertension 08/24/2010  . Hypokalemia 05/25/2013   Improved stopping HCTZ. Was noted to have an elevated glucose when K was low. Recheck renal next week after starting Maxzide  . Hypomagnesemia 08/10/2014  . Hypothyroidism   . Laryngitis 08/25/2017  . Leg cramps 04/15/2016  . Nausea without vomiting 08/08/2014  . NONSPEC ELEVATION OF LEVELS OF TRANSAMINASE/LDH 05/01/2007   Qualifier: Diagnosis of  By: Garner Gavel    . Osteoarthritis    chronic, right knee (11/04/2017)  . OVARIAN CYST 07/11/2009   Qualifier: Diagnosis of  By: Wynona Luna   . Ovarian cyst   . Plantar  fasciitis   . Rosacea 10/22/2016  . Urine incontinence    Allergies  Allergen Reactions  . Epinephrine Other (See Comments)    Pass out   . Repatha [Evolocumab] Nausea And Vomiting  . Statins Other (See Comments)    Myalgias: Simvastatin also caused back ache  . Welchol [Colesevelam Hcl]     myalgia  . Amoxicillin Itching and Rash    Has patient had a PCN reaction causing immediate rash, facial/tongue/throat swelling, SOB or lightheadedness with hypotension: No Has patient had a PCN reaction causing severe rash involving mucus membranes or skin necrosis: No Has patient had a PCN reaction that required hospitalization: No Has patient had a PCN reaction occurring within the last 10 years: Yes If all of the above answers are "NO", then may proceed with Cephalosporin use.    Past Surgical History:  Procedure Laterality Date  . AUGMENTATION MAMMAPLASTY Bilateral 2002  . BLADDER SUSPENSION  1981  . COLONOSCOPY    . CORONARY STENT INTERVENTION N/A 11/04/2017   Procedure: CORONARY STENT INTERVENTION;  Surgeon: Nelva Bush, MD;  Location: [facility];  Service: Cardiovascular;  Laterality: N/A;  . ESOPHAGOGASTRODUODENOSCOPY    . INTRAVASCULAR PRESSURE WIRE/FFR STUDY N/A 11/04/2017   Procedure: INTRAVASCULAR PRESSURE WIRE/FFR STUDY;  Surgeon: Nelva Bush, MD;  Location: [facility];  Service: Cardiovascular;  Laterality: N/A;  . INTRAVASCULAR ULTRASOUND/IVUS N/A 11/04/2017   Procedure: Intravascular Ultrasound/IVUS;  Surgeon: Nelva Bush, MD;  Location: [facility];  Service: Cardiovascular;  Laterality: N/A;  . KNEE ARTHROSCOPY  1998, [id number]  . LAPAROSCOPIC CHOLECYSTECTOMY  2007  . LEFT HEART CATH AND CORONARY ANGIOGRAPHY N/A 11/04/2017   Procedure: LEFT HEART CATH AND CORONARY ANGIOGRAPHY;  Surgeon: Nelva Bush, MD;  Location: [place];  Service: Cardiovascular;  Laterality: N/A;  . LEFT HEART CATHETERIZATION WITH CORONARY ANGIOGRAM N/A  11/21/2011   Procedure: LEFT HEART CATHETERIZATION WITH CORONARY ANGIOGRAM;  Surgeon: Josue Hector, MD;  Location: [facility];  Service: Cardiovascular;  Laterality: N/A;  . TONSILLECTOMY  1975  . VAGINAL HYSTERECTOMY  1983   "still have my ovaries"   Family History  Problem Relation Age of Onset  . Alzheimer's disease Mother   . Diabetes type II Mother   . Hiatal hernia Mother   . Diabetes Mother   . Emphysema Father   . COPD Father   . Depression Sister        suicide  . Diabetes Sister   . Diabetes Daughter   . Colon cancer Neg Hx   . Stomach cancer Neg Hx    Social History   Socioeconomic History  . Marital status: Married    Spouse name: Not on file  . Number of children: 1  . Years of education: Not on file  . Highest education level: Not on file  Occupational History  . Occupation: IT TECH, admin. assistant    Employer: [employer]  Social Needs  . Financial resource strain: Not on file  . Food insecurity:    Worry: Not on file    Inability: Not on file  . Transportation needs:    Medical: Not on file    Non-medical: Not on file  Tobacco Use  . Smoking status: Never Smoker  . Smokeless tobacco: Never Used  Substance and Sexual Activity  . Alcohol use: Not Currently  . Drug use: Never  . Sexual activity: Not Currently    Partners: Male  Lifestyle  . Physical activity:    Days per week: Not on file    Minutes per session: Not on file  . Stress: Not on file  Relationships  . Social connections:    Talks on phone: Not on file    Gets together: Not on file    Attends religious service: Not on file    Active member of club or organization: Not on file    Attends meetings of clubs or organizations: Not on file    Relationship status: Not on file  . Intimate partner violence:    Fear of current or ex partner: Not on file    Emotionally abused: Not on file    Physically abused: Not on file    Forced sexual activity: Not on file  Other Topics Concern    . Not on file  Social History Narrative   Marital status/children/pets: Married, 1 child.    Education/employment: HS   Safety:      -smoke alarm in the home:Yes     - wears seatbelt: Yes     - Feels safe in their relationships: Yes   Allergies  as of 06/03/2018      Reactions   Epinephrine Other (See Comments)   Pass out    Repatha [evolocumab] Nausea And Vomiting   Statins Other (See Comments)   Myalgias: Simvastatin also caused back ache   Welchol [colesevelam Hcl]    myalgia   Amoxicillin Itching, Rash   Has patient had a PCN reaction causing immediate rash, facial/tongue/throat swelling, SOB or lightheadedness with hypotension: No Has patient had a PCN reaction causing severe rash involving mucus membranes or skin necrosis: No Has patient had a PCN reaction that required hospitalization: No Has patient had a PCN reaction occurring within the last 10 years: Yes If all of the above answers are "NO", then may proceed with Cephalosporin use.      Medication List       Accurate as of June 03, 2018 11:59 PM. Always use your most recent med list.        ACCU-CHEK FASTCLIX LANCETS Misc Check blood sugar once daily Dz:E11.9   aspirin 81 MG tablet Take 81 mg by mouth daily.   B-D ULTRAFINE III SHORT PEN 31G X 8 MM Misc Generic drug:  Insulin Pen Needle INJECT ONCE DAILY   CALTRATE 600 PO Take 600 mg by mouth 2 (two) times daily.   diltiazem 240 MG 24 hr capsule Commonly known as:  CARDIZEM CD Take 1 capsule (240 mg total) by mouth daily.   famotidine 20 MG tablet Commonly known as:  PEPCID Take 1 tablet (20 mg total) by mouth daily.   FARXIGA 10 MG Tabs tablet Generic drug:  dapagliflozin propanediol TAKE 1 TABLET EVERY DAY   fenofibrate 160 MG tablet TAKE 1 TABLET BY MOUTH EVERY DAY   glucose blood test strip Commonly known as:  ACCU-CHEK SMARTVIEW Check blood sugars once daily   Krill Oil 350 MG Caps Take 350 mg by mouth daily.   levothyroxine 88 MCG  tablet Commonly known as:  SYNTHROID, LEVOTHROID 1 tab po daily except on  Sat take 2 tabs   Magnesium 250 MG Tabs Take 250-500 mg by mouth See admin instructions. Take 233m by mouth Sunday, Tuesday, Thursday and Saturday and 5095mMonday, Wednesday and Friday   metFORMIN 500 MG tablet Commonly known as:  GLUCOPHAGE Take 1 tablet (500 mg total) by mouth daily with breakfast.   metoprolol tartrate 25 MG tablet Commonly known as:  LOPRESSOR Take 1 tablet (25 mg total) by mouth 2 (two) times daily.   nitroGLYCERIN 0.4 MG SL tablet Commonly known as:  NITROSTAT Place 1 tablet (0.4 mg total) under the tongue every 5 (five) minutes as needed for chest pain.   OSTEO BI-FLEX REGULAR STRENGTH 250-200 MG Tabs Generic drug:  Glucosamine-Chondroitin Take 1 tablet by mouth 2 (two) times daily.   pantoprazole 40 MG tablet Commonly known as:  PROTONIX Take 1 tablet (40 mg total) by mouth daily.   pioglitazone 30 MG tablet Commonly known as:  ACTOS TAKE 1 TABLET BY MOUTH EVERY DAY   POTASSIUM CITRATE PO Take 99 mg by mouth daily.   PROBIOTIC DAILY PO Take 99 mg by mouth daily.   repaglinide 0.5 MG tablet Commonly known as:  PRANDIN TAKE 1 TABLET (0.5 MG TOTAL) BY MOUTH DAILY WITH SUPPER.   ticagrelor 90 MG Tabs tablet Commonly known as:  BRILINTA Take 1 tablet (90 mg total) by mouth 2 (two) times daily.   triamterene-hydrochlorothiazide 37.5-25 MG tablet Commonly known as:  MAXZIDE-25 Take 0.5 tablets by mouth daily.   VICTOZA 18  MG/3ML Sopn Generic drug:  liraglutide INJECT 1.8MG(0.3ML) DAILY FOR 90 DAYS       All past medical history, surgical history, allergies, family history, immunizations andmedications were updated in the EMR today and reviewed under the history and medication portions of their EMR.    Recent Results (from the past 2160 hour(s))  POCT HgB A1C     Status: Abnormal   Collection Time: 03/31/18  8:24 AM  Result Value Ref Range   Hemoglobin A1C 6.1 (A)  4.0 - 5.6 %   HbA1c POC (<> result, manual entry)     HbA1c, POC (prediabetic range)     HbA1c, POC (controlled diabetic range)    HM DIABETES EYE EXAM     Status: None   Collection Time: 04/21/18 12:00 AM  Result Value Ref Range   HM Diabetic Eye Exam No Retinopathy No Retinopathy  TSH     Status: None   Collection Time: 06/03/18  3:01 PM  Result Value Ref Range   TSH 1.43 0.35 - 4.50 uIU/mL  Comp Met (CMET)     Status: Abnormal   Collection Time: 06/03/18  3:01 PM  Result Value Ref Range   Sodium 137 135 - 145 mEq/L   Potassium 3.8 3.5 - 5.1 mEq/L   Chloride 99 96 - 112 mEq/L   CO2 27 19 - 32 mEq/L   Glucose, Bld 108 (H) 70 - 99 mg/dL   BUN 16 6 - 23 mg/dL   Creatinine, Ser 0.97 0.40 - 1.20 mg/dL   Total Bilirubin 0.5 0.2 - 1.2 mg/dL   Alkaline Phosphatase 91 39 - 117 U/L   AST 19 0 - 37 U/L   ALT 18 0 - 35 U/L   Total Protein 7.4 6.0 - 8.3 g/dL   Albumin 4.4 3.5 - 5.2 g/dL   Calcium 10.0 8.4 - 10.5 mg/dL   GFR 61.51 >60.00 mL/min  CBC     Status: Abnormal   Collection Time: 06/03/18  3:01 PM  Result Value Ref Range   WBC 8.3 4.0 - 10.5 K/uL   RBC 5.15 (H) 3.87 - 5.11 Mil/uL   Platelets 366.0 150.0 - 400.0 K/uL   Hemoglobin 11.5 (L) 12.0 - 15.0 g/dL   HCT 37.0 36.0 - 46.0 %   MCV 71.7 (L) 78.0 - 100.0 fl   MCHC 31.2 30.0 - 36.0 g/dL   RDW 16.4 (H) 11.5 - 15.5 %  Iron, TIBC and Ferritin Panel     Status: Abnormal   Collection Time: 06/03/18  3:01 PM  Result Value Ref Range   Iron 30 (L) 45 - 160 mcg/dL   TIBC 544 (H) 250 - 450 mcg/dL (calc)   %SAT 6 (L) 16 - 45 % (calc)   Ferritin 8 (L) 16 - 288 ng/mL  H. pylori antibody, IgG     Status: None   Collection Time: 06/03/18  3:01 PM  Result Value Ref Range   H Pylori IgG Negative Negative  Urine Microalbumin w/creat. ratio     Status: None   Collection Time: 06/03/18  3:01 PM  Result Value Ref Range   Microalb, Ur 1.2 0.0 - 1.9 mg/dL   Creatinine,U 126.8 mg/dL   Microalb Creat Ratio 1.0 0.0 - 30.0 mg/g    ROS:  14 pt review of systems performed and negative (unless mentioned in an HPI)  Objective: BP 108/69 (BP Location: Left Arm, Patient Position: Sitting, Cuff Size: Normal)   Pulse 73   Temp 97.6 F (36.4 C) (  Oral)   Resp 16   Ht _0  (1.651 m)   Wt 162 lb 2 oz (73.5 kg)   SpO2 97%   BMI 26.98 kg/m  Gen: Afebrile. No acute distress. Nontoxic in appearance, well-developed, well-nourished, Asian female HENT: AT. [place].  MMM, no oral lesions. Bilateral nares within normal limits. Throat without erythema, ulcerations or exudates.  No cough on exam, no hoarseness on exam. Eyes:Pupils Equal Round Reactive to light, Extraocular movements intact,  Conjunctiva without redness, discharge or icterus. Neck/lymp/endocrine: Supple, no lymphadenopathy, no thyromegaly CV: RRR no murmur, no edema, +2/4 P posterior tibialis pulses.  No carotid bruits. No JVD. Chest: CTAB, no wheeze, rhonchi or crackles.  Normal respiratory effort.  Good air movement. Abd: Soft.  Overweight.  Mild epigastric discomfort.  ND. BS present. no Masses palpated. No hepatosplenomegaly. No rebound tenderness or guarding. Skin: no rashes, purpura or petechiae. Warm and well-perfused. Skin intact. Neuro/Msk: Normal gait. PERLA. EOMi. Alert. Oriented x3.   Psych: Normal affect, dress and demeanor. Normal speech. Normal thought content and judgment.   Assessment/plan: [name] [name] is a 63 y.o. female present for TOC Hyperlipidemia LDL goal <70/Essential hypertension/Overweight (BMI 25.0-29.9) -Stable today.  Patient currently on Cardizem, fenofibrate, metoprolol twice daily, Brilinta (till June 2020), ASA, Maxide. -Reportedly has been unable to tolerate any type of statin oral cholesterol medication including injectables. -Continue routine follow-ups with cardiology.  Currently medications have been refilled by cardiology. - TSH - Comp Met (CMET) - CBC - Urine Microalbumin w/creat. ratio  Gastroesophageal reflux disease without  esophagitis -Refill on Protonix and Pepcid.  With history of nausea and vomiting may be worsening gastritis versus peptic ulcer.  H. pylori collected today.  May need to refer back to gastroenterology for further evaluation.  Non-insulin dependent type 2 diabetes mellitus ([place]) -Followed by endocrine.  On multiple oral medication management, not insulin-dependent.  Controlled.  Last A1c 6.1-03/31/2018 - Urine Microalbumin w/creat. ratio   Iron deficiency anemia, unspecified iron deficiency anemia type  -History of iron deficiency anemia, with current symptoms of hair loss fatigue.  We will recheck her levels today.  With abdominal discomfort, concern for gastritis or cause of slow blood loss/IDA.  May need referred back to gastroenterology for further evaluation. - CBC - Iron, TIBC and Ferritin Panel  Acquired hypothyroidism TSH 0.17 01/13/2018- patient was lowered approximately 88 mcg a week.  Now takes 88 mcg 6 days a week and 2 tabs of 88 mcg 1 day a week. - TSH -Test TSH today to ensure normal value, then refills will be provided for 1 year.  Nausea and vomiting, intractability of vomiting not specified, unspecified vomiting type/Abdominal discomfort/hair thinning -Possible nausea and vomiting from gastroparesis, normal gastric emptying study 2013.  Her diabetes is well controlled.  Reports no medication changes recently.  Victoza cause slow gastric emptying, but this has been on her medication regimen for years.  Concern for gastritis/peptic ulcer with nausea vomiting and stomach discomfort.  Complete H. pylori IgG today since she has never been diagnosed with H. pylori.  If positive for presumed new and treat prophylactically.  Likely will need referral back to gastroenterology for further evaluation of symptoms if does not resolve. - H. pylori antibody, IgG   Return in about 6 months (around 12/02/2018) for HTN'.  Greater than 40 minutes spent with patient, >50% of time spent face to  face   Note is dictated utilizing voice recognition software. Although note has been proof read prior to signing, occasional typographical  errors still can be missed. If any questions arise, please do not hesitate to call for verification.  Electronically signed by: Howard Pouch, DO [place]

## 2018-06-04 LAB — TSH: TSH: 1.43 u[IU]/mL (ref 0.35–4.50)

## 2018-06-04 LAB — CBC
HCT: 37 % (ref 36.0–46.0)
Hemoglobin: 11.5 g/dL — ABNORMAL LOW (ref 12.0–15.0)
MCHC: 31.2 g/dL (ref 30.0–36.0)
MCV: 71.7 fl — ABNORMAL LOW (ref 78.0–100.0)
Platelets: 366 10*3/uL (ref 150.0–400.0)
RBC: 5.15 Mil/uL — ABNORMAL HIGH (ref 3.87–5.11)
RDW: 16.4 % — ABNORMAL HIGH (ref 11.5–15.5)
WBC: 8.3 10*3/uL (ref 4.0–10.5)

## 2018-06-04 LAB — COMPREHENSIVE METABOLIC PANEL
ALT: 18 U/L (ref 0–35)
AST: 19 U/L (ref 0–37)
Albumin: 4.4 g/dL (ref 3.5–5.2)
Alkaline Phosphatase: 91 U/L (ref 39–117)
BILIRUBIN TOTAL: 0.5 mg/dL (ref 0.2–1.2)
BUN: 16 mg/dL (ref 6–23)
CALCIUM: 10 mg/dL (ref 8.4–10.5)
CO2: 27 mEq/L (ref 19–32)
CREATININE: 0.97 mg/dL (ref 0.40–1.20)
Chloride: 99 mEq/L (ref 96–112)
GFR: 61.51 mL/min (ref >60.00)
Glucose, Bld: 108 mg/dL — ABNORMAL HIGH (ref 70–99)
Potassium: 3.8 mEq/L (ref 3.5–5.1)
Sodium: 137 mEq/L (ref 135–145)
Total Protein: 7.4 g/dL (ref 6.0–8.3)

## 2018-06-04 LAB — MICROALBUMIN / CREATININE URINE RATIO
Creatinine,U: 126.8 mg/dL
Microalb Creat Ratio: 1 mg/g (ref 0.0–30.0)
Microalb, Ur: 1.2 mg/dL (ref 0.0–1.9)

## 2018-06-04 LAB — IRON,TIBC AND FERRITIN PANEL
%SAT: 6 % (calc) — ABNORMAL LOW (ref 16–45)
Ferritin: 8 ng/mL — ABNORMAL LOW (ref 16–288)
IRON: 30 ug/dL — AB (ref 45–160)
TIBC: 544 mcg/dL (calc) — ABNORMAL HIGH (ref 250–450)

## 2018-06-04 LAB — H. PYLORI ANTIBODY, IGG: H Pylori IgG: NEGATIVE

## 2018-06-04 NOTE — Progress Notes (Signed)
Cardiology Office Note   Date:  06/05/2018   ID:  [name] [name], DOB 16-Sep-1955, MRN [id number]  PCP:  Ma Hillock, DO  Cardiologist:   Minus Breeding, MD Referring:  Ma Hillock, DO  Chief Complaint  Patient presents with  . Coronary Artery Disease      History of Present Illness: [name] [name] is a 63 y.o. female who presents for evaluation of CAD.  She had dyspnea with normal echo and a coronary CTA was abnormal showing LAD disease. Cath done 11/04/17 showed 60% LAD, FFR was significant and she underwent PCI with DES. She had mild scattered disease elsewhere.     She presents for followup.  She is had no cardiovascular complaints.  She is being worked up for some protracted nausea and vomiting.  She is not getting any of the shortness of breath that was her previous angina.  She unfortunately cannot exercise because of foot problems and leg pains.   Past Medical History:  Diagnosis Date  . Asthma   . Chronic pain disorder    neck and shoulder  . CTS (carpal tunnel syndrome) 01/27/2013   right  . Diabetes mellitus type II   . Diabetic gastroparesis ([place])   . Esophageal reflux 08/25/2013   [place], Dr Barth Kirks  . FATTY LIVER DISEASE 04/27/2007   Qualifier: Diagnosis of  By: Danelle Earthly CMA, Darlene    . Fundic gland polyps of stomach, benign   . HTN (hypertension)   . Hyperlipidemia, mixed 05/04/2007   Qualifier: Diagnosis of  By: Wynona Luna crestor caused myalgias even at low dose Livalo caused myalgias Lipitor  Mother with severe reaction myalgias Simvastatin, Welchol   . Hypertension 08/24/2010  . Hypokalemia 05/25/2013   Improved stopping HCTZ. Was noted to have an elevated glucose when K was low. Recheck renal next week after starting Maxzide  . Hypomagnesemia 08/10/2014  . Hypothyroidism   . Laryngitis 08/25/2017  . Leg cramps 04/15/2016  . Nausea without vomiting 08/08/2014  . NONSPEC ELEVATION OF LEVELS OF TRANSAMINASE/LDH 05/01/2007   Qualifier: Diagnosis of  By: Garner Gavel    . Osteoarthritis    chronic, right knee (11/04/2017)  . OVARIAN CYST 07/11/2009   Qualifier: Diagnosis of  By: Wynona Luna   . Plantar fasciitis   . Rosacea 10/22/2016    Past Surgical History:  Procedure Laterality Date  . AUGMENTATION MAMMAPLASTY Bilateral 2002  . COLONOSCOPY    . CORONARY STENT INTERVENTION N/A 11/04/2017   Procedure: CORONARY STENT INTERVENTION;  Surgeon: Nelva Bush, MD;  Location: [place] [facility];  Service: Cardiovascular;  Laterality: N/A;  . ESOPHAGOGASTRODUODENOSCOPY    . INTRAVASCULAR PRESSURE WIRE/FFR STUDY N/A 11/04/2017   Procedure: INTRAVASCULAR PRESSURE WIRE/FFR STUDY;  Surgeon: Nelva Bush, MD;  Location: [place] [facility];  Service: Cardiovascular;  Laterality: N/A;  . INTRAVASCULAR ULTRASOUND/IVUS N/A 11/04/2017   Procedure: Intravascular Ultrasound/IVUS;  Surgeon: Nelva Bush, MD;  Location: [place] [facility];  Service: Cardiovascular;  Laterality: N/A;  . KNEE ARTHROSCOPY  1998, [id number]  . LAPAROSCOPIC CHOLECYSTECTOMY    . LEFT HEART CATH AND CORONARY ANGIOGRAPHY N/A 11/04/2017   Procedure: LEFT HEART CATH AND CORONARY ANGIOGRAPHY;  Surgeon: Nelva Bush, MD;  Location: [place] [facility];  Service: Cardiovascular;  Laterality: N/A;  . LEFT HEART CATHETERIZATION WITH CORONARY ANGIOGRAM N/A 11/21/2011   Procedure: LEFT HEART CATHETERIZATION WITH CORONARY ANGIOGRAM;  Surgeon: Josue Hector, MD;  Location: [facility];  Service: Cardiovascular;  Laterality: N/A;  . TONSILLECTOMY  1975  . VAGINAL HYSTERECTOMY  1983   "still have my ovaries"     Current Outpatient Medications  Medication Sig Dispense Refill  . ACCU-CHEK FASTCLIX LANCETS MISC Check blood sugar once daily Dz:E11.9 100 each 3  . aspirin 81 MG tablet Take 81 mg by mouth daily.     . B-D ULTRAFINE III SHORT PEN 31G X 8 MM MISC INJECT ONCE DAILY 100 each 3  . Calcium Carbonate (CALTRATE 600 PO) Take 600 mg by mouth 2  (two) times daily.     Marland Kitchen diltiazem (CARDIZEM CD) 240 MG 24 hr capsule Take 1 capsule (240 mg total) by mouth daily. 90 capsule 3  . famotidine (PEPCID) 20 MG tablet Take 1 tablet by mouth daily.    Marland Kitchen FARXIGA 10 MG TABS tablet TAKE 1 TABLET EVERY DAY 90 tablet 3  . fenofibrate 160 MG tablet TAKE 1 TABLET BY MOUTH EVERY DAY 90 tablet 0  . Glucosamine-Chondroitin (OSTEO BI-FLEX REGULAR STRENGTH) 250-200 MG TABS Take 1 tablet by mouth 2 (two) times daily.     Marland Kitchen glucose blood (ACCU-CHEK SMARTVIEW) test strip Check blood sugars once daily 100 each 12  . Krill Oil 350 MG CAPS Take 350 mg by mouth daily.     Marland Kitchen levothyroxine (SYNTHROID, LEVOTHROID) 88 MCG tablet 1 tab po daily except on Tues and Sat take 2 tabs 90 tablet 0  . Magnesium 250 MG TABS Take 250-500 mg by mouth See admin instructions. Take 250mg  by mouth Sunday, Tuesday, Thursday and Saturday and 500mg  Monday, Wednesday and Friday    . metFORMIN (GLUCOPHAGE) 500 MG tablet Take 1 tablet (500 mg total) by mouth daily with breakfast. 90 tablet 3  . metoprolol tartrate (LOPRESSOR) 25 MG tablet Take 1 tablet (25 mg total) by mouth 2 (two) times daily. 180 tablet 3  . nitroGLYCERIN (NITROSTAT) 0.4 MG SL tablet Place 1 tablet (0.4 mg total) under the tongue every 5 (five) minutes as needed for chest pain. 50 tablet 3  . pantoprazole (PROTONIX) 40 MG tablet Take 1 tablet (40 mg total) by mouth daily. 90 tablet 1  . pioglitazone (ACTOS) 30 MG tablet TAKE 1 TABLET BY MOUTH EVERY DAY 90 tablet 3  . POTASSIUM CITRATE PO Take 99 mg by mouth daily.     . Probiotic Product (PROBIOTIC DAILY PO) Take 99 mg by mouth daily.     . repaglinide (PRANDIN) 0.5 MG tablet TAKE 1 TABLET (0.5 MG TOTAL) BY MOUTH DAILY WITH SUPPER. 90 tablet 0  . ticagrelor (BRILINTA) 90 MG TABS tablet Take 1 tablet (90 mg total) by mouth 2 (two) times daily. 180 tablet 3  . triamterene-hydrochlorothiazide (MAXZIDE-25) 37.5-25 MG tablet TAKE 1/2 TABLET BY MOUTH EVERY DAY 45 tablet 3  .  VICTOZA 18 MG/3ML SOPN INJECT 1.8MG (0.3ML) DAILY FOR 90 DAYS 9 mL 3   No current facility-administered medications for this visit.     Allergies:   Epinephrine; Repatha [evolocumab]; Statins; Welchol [colesevelam hcl]; and Amoxicillin     ROS:  Please see the history of present illness.   Otherwise, review of systems are positive for none.   All other systems are reviewed and negative.    PHYSICAL EXAM: VS:  BP (!) 108/50   Pulse 96   Ht 5' 5.5" (1.664 m)   Wt 160 lb 12.8 oz (72.9 kg)   SpO2 99%   BMI 26.35 kg/m  , BMI Body mass index is 26.35 kg/m. GENERAL:  Well appearing NECK:  No jugular venous distention, waveform within normal limits, carotid upstroke brisk and symmetric, no bruits, no thyromegaly LUNGS:  Clear to auscultation bilaterally CHEST:  Unremarkable HEART:  PMI not displaced or sustained,S1 and S2 within normal limits, no S3, no S4, no clicks, no rubs, no murmurs ABD:  Flat, positive bowel sounds normal in frequency in pitch, no bruits, no rebound, no guarding, no midline pulsatile mass, no hepatomegaly, no splenomegaly EXT:  2 plus pulses throughout, no edema, no cyanosis no clubbing    EKG:  EKG not ordered today.    Recent Labs: 08/25/2017: Magnesium 1.2 06/03/2018: ALT 18; BUN 16; Creatinine, Ser 0.97; Hemoglobin 11.5; Platelets 366.0; Potassium 3.8; Sodium 137; TSH 1.43    Lipid Panel    Component Value Date/Time   CHOL 220 (H) 08/25/2017 1620   TRIG 124.0 08/25/2017 1620   HDL 50.20 08/25/2017 1620   CHOLHDL 4 08/25/2017 1620   VLDL 24.8 08/25/2017 1620   LDLCALC 145 (H) 08/25/2017 1620   LDLDIRECT 151.0 06/01/2015 0812      Wt Readings from Last 3 Encounters:  06/05/18 160 lb 12.8 oz (72.9 kg)  06/03/18 162 lb 2 oz (73.5 kg)  03/31/18 165 lb 3.2 oz (74.9 kg)      Other studies Reviewed: Additional studies/ records that were reviewed today include: None Review of the above records demonstrates:     ASSESSMENT AND PLAN:  CAD:   The  patient has no new sypmtoms.  No further cardiovascular testing is indicated.  We will continue with aggressive risk reduction and meds as listed.  She can stop the Brilinta in June as it will have been 12 months.  She continues her aspirin.  HTN:    The blood pressure is at target.  No change in therapy.   HYPERLIPIDEMIA:   She has been unable to take statins.  She could not take PCSK9.    We talked about potential need for diet and exercise that she might be able to tolerate.   Current medicines are reviewed at length with the patient today.  The patient does not have concerns regarding medicines.  The following changes have been made:  None  Labs/ tests ordered today include:  None No orders of the defined types were placed in this encounter.    Disposition:   FU with in 12 months.   Signed, Minus Breeding, MD  06/05/2018 8:08 AM    [facility]

## 2018-06-05 ENCOUNTER — Telehealth: Payer: Self-pay | Admitting: Family Medicine

## 2018-06-05 ENCOUNTER — Encounter: Payer: Self-pay | Admitting: Cardiology

## 2018-06-05 ENCOUNTER — Encounter: Payer: Self-pay | Admitting: Family Medicine

## 2018-06-05 ENCOUNTER — Ambulatory Visit: Payer: BLUE CROSS/BLUE SHIELD | Admitting: Cardiology

## 2018-06-05 VITALS — BP 108/50 | HR 96 | Ht 65.5 in | Wt 160.8 lb

## 2018-06-05 DIAGNOSIS — E785 Hyperlipidemia, unspecified: Secondary | ICD-10-CM | POA: Diagnosis not present

## 2018-06-05 DIAGNOSIS — I2510 Atherosclerotic heart disease of native coronary artery without angina pectoris: Secondary | ICD-10-CM

## 2018-06-05 DIAGNOSIS — I10 Essential (primary) hypertension: Secondary | ICD-10-CM

## 2018-06-05 DIAGNOSIS — E118 Type 2 diabetes mellitus with unspecified complications: Secondary | ICD-10-CM

## 2018-06-05 DIAGNOSIS — D508 Other iron deficiency anemias: Secondary | ICD-10-CM

## 2018-06-05 DIAGNOSIS — R112 Nausea with vomiting, unspecified: Secondary | ICD-10-CM

## 2018-06-05 MED ORDER — ONDANSETRON HCL 4 MG PO TABS: 4.0000 mg | ORAL_TABLET | Freq: Three times a day (TID) | ORAL | 0 refills | Status: DC | PRN

## 2018-06-05 MED ORDER — SUCRALFATE 1 G PO TABS: 1.0000 g | ORAL_TABLET | Freq: Three times a day (TID) | ORAL | 1 refills | Status: DC

## 2018-06-05 MED ORDER — IRON POLYSACCH CMPLX-B12-FA 150-0.025-1 MG PO CAPS: 1.0000 | ORAL_CAPSULE | Freq: Every day | ORAL | 0 refills | Status: DC

## 2018-06-05 NOTE — Telephone Encounter (Signed)
Pt notified labs have not reviewed. Pt notified that we will call her once the labs are complete.

## 2018-06-05 NOTE — Patient Instructions (Signed)
Medication Instructions:  STOP- Brilinta in June  If you need a refill on your cardiac medications before your next appointment, please call your pharmacy.  Labwork: None Ordered   Take the provided lab slips with you to the lab for your blood draw.  When you have your labs (blood work) drawn today and your tests are completely normal, you will receive your results only by MyChart Message (if you have MyChart) -OR-  A paper copy in the mail.  If you have any lab test that is abnormal or we need to change your treatment, we will call you to review these results.  Testing/Procedures: None Ordered  Follow-Up: You will need a follow up appointment in 1 Year.  Please call our office 2 months in advance to schedule this appointment.  You may see Minus Breeding, MD or one of the following Advanced Practice Providers on your designated Care Team:   Rosaria Ferries, PA-C . Jory Sims, DNP, ANP    At [facility], you and your health needs are our priority.  As part of our continuing mission to provide you with exceptional heart care, we have created designated Provider Care Teams.  These Care Teams include your primary Cardiologist (physician) and Advanced Practice Providers (APPs -  Physician Assistants and Nurse Practitioners) who all work together to provide you with the care you need, when you need it.  Thank you for choosing [facility] at [facility]!!

## 2018-06-05 NOTE — Telephone Encounter (Signed)
Attempted to call pt to discuss all lab results. Left a detailed message (DPR allowed) - her H. Pylori is negative.  - Liver/kidney function is normal. - thyroid is normal.  - iron is extremely low (IDA)--> referred to hematology for iron transfusion consideration. Prescribed  iron polysaccharide to start immediately. Likely at least partial cause of her hair thinning and feeling run down.  - for her Nausea and vomit: prescribed zofran and  trial of Carafate. Referred to her gastro to discuss in more detail- concern of gastritis, ulceration, slow bleed etc as possible cause of her iron deficiency since she is on asprin and brillinta.   [name] [name]: please attempt to call her Monday to see if she got her message from me and her instructions.

## 2018-06-05 NOTE — Telephone Encounter (Signed)
Copied from [place] [id number]. Topic: Quick Communication - See Telephone Encounter >> Jun 05, 2018 11:37 AM Bea Graff, NT wrote: CRM for notification. See Telephone encounter for: 06/05/18. Pt would like a call to discuss her lab results.

## 2018-06-08 ENCOUNTER — Encounter: Payer: Self-pay | Admitting: Family Medicine

## 2018-06-08 DIAGNOSIS — L659 Nonscarring hair loss, unspecified: Secondary | ICD-10-CM | POA: Insufficient documentation

## 2018-06-08 DIAGNOSIS — R110 Nausea: Secondary | ICD-10-CM | POA: Insufficient documentation

## 2018-06-08 MED ORDER — LEVOTHYROXINE SODIUM 88 MCG PO TABS: ORAL_TABLET | ORAL | 3 refills | Status: DC

## 2018-06-08 MED ORDER — FAMOTIDINE 20 MG PO TABS: 20.0000 mg | ORAL_TABLET | Freq: Every day | ORAL | 3 refills | Status: DC

## 2018-06-08 MED ORDER — TRIAMTERENE-HCTZ 37.5-25 MG PO TABS: 0.5000 | ORAL_TABLET | Freq: Every day | ORAL | 1 refills | Status: DC

## 2018-06-08 MED ORDER — PANTOPRAZOLE SODIUM 40 MG PO TBEC: 40.0000 mg | DELAYED_RELEASE_TABLET | Freq: Every day | ORAL | 3 refills | Status: DC

## 2018-06-08 NOTE — Telephone Encounter (Signed)
Pt contacted and made sure she received phone message on Friday. Pt stated that she had received message and has picked up all medications, except the iron, in which the pharmacy had to order and will pick up today.

## 2018-06-09 ENCOUNTER — Telehealth: Payer: Self-pay | Admitting: Family Medicine

## 2018-06-09 ENCOUNTER — Encounter: Payer: Self-pay | Admitting: Family Medicine

## 2018-06-09 MED ORDER — SUCRALFATE 1 GM/10ML PO SUSP: 1.0000 g | Freq: Three times a day (TID) | ORAL | 0 refills | Status: DC

## 2018-06-09 NOTE — Telephone Encounter (Signed)
Pt echart message stated she could not take the Carafate pill, even when cut in half.  Unfortunately, Carafate can not be crushed. There is a liquid format, however I am not certain the if there will be a cost difference for her. I did call it in for her

## 2018-06-09 NOTE — Telephone Encounter (Signed)
Pt notified that we have sent in the liquid form of the Carafate. Advised pt to call her pharmacy to see if there is a cost difference. Pt is to call back if medication is too expensive or if she has any other problems or concerns.

## 2018-06-10 ENCOUNTER — Other Ambulatory Visit: Payer: Self-pay | Admitting: Family Medicine

## 2018-06-10 ENCOUNTER — Telehealth: Payer: Self-pay | Admitting: Hematology & Oncology

## 2018-06-10 NOTE — Telephone Encounter (Signed)
Please inform pt the Carafate solution is not covered by her insurance. If she wants to try the solution she will need to pay out of pocket and it is at her pharmacy.

## 2018-06-10 NOTE — Telephone Encounter (Signed)
Called and spoke with patient regarding appointment date/time.  Letter/Calendar mailed

## 2018-06-10 NOTE — Telephone Encounter (Signed)
Spoke w pt and advised that insurance doesn't want to cover the carafate solution, that she will have to pay out of pocket for it. Pt stated that she will call [facility] to find out price. She also stated that she has a care specialist w her insurance company, which she said she would call them and see if they can help. Pt advised to call back to [place] if she has anymore problems or concerns.

## 2018-06-12 ENCOUNTER — Ambulatory Visit: Payer: BLUE CROSS/BLUE SHIELD | Admitting: Nurse Practitioner

## 2018-06-12 ENCOUNTER — Other Ambulatory Visit: Payer: Self-pay | Admitting: Family Medicine

## 2018-06-26 ENCOUNTER — Encounter: Payer: Self-pay | Admitting: Hematology & Oncology

## 2018-06-26 ENCOUNTER — Encounter: Payer: Self-pay | Admitting: Gastroenterology

## 2018-06-26 ENCOUNTER — Ambulatory Visit: Payer: BLUE CROSS/BLUE SHIELD | Admitting: Gastroenterology

## 2018-06-26 ENCOUNTER — Other Ambulatory Visit: Payer: Self-pay

## 2018-06-26 ENCOUNTER — Inpatient Hospital Stay: Payer: BLUE CROSS/BLUE SHIELD | Attending: Hematology & Oncology

## 2018-06-26 ENCOUNTER — Inpatient Hospital Stay (HOSPITAL_BASED_OUTPATIENT_CLINIC_OR_DEPARTMENT_OTHER): Payer: BLUE CROSS/BLUE SHIELD | Admitting: Hematology & Oncology

## 2018-06-26 VITALS — BP 110/60 | HR 82 | Ht 65.5 in | Wt 159.0 lb

## 2018-06-26 VITALS — BP 116/54 | HR 76 | Temp 97.6°F | Resp 18 | Ht 65.5 in | Wt 158.8 lb

## 2018-06-26 DIAGNOSIS — D510 Vitamin B12 deficiency anemia due to intrinsic factor deficiency: Secondary | ICD-10-CM

## 2018-06-26 DIAGNOSIS — R718 Other abnormality of red blood cells: Secondary | ICD-10-CM | POA: Insufficient documentation

## 2018-06-26 DIAGNOSIS — E119 Type 2 diabetes mellitus without complications: Secondary | ICD-10-CM

## 2018-06-26 DIAGNOSIS — K219 Gastro-esophageal reflux disease without esophagitis: Secondary | ICD-10-CM | POA: Diagnosis not present

## 2018-06-26 DIAGNOSIS — D508 Other iron deficiency anemias: Secondary | ICD-10-CM

## 2018-06-26 DIAGNOSIS — K909 Intestinal malabsorption, unspecified: Secondary | ICD-10-CM | POA: Insufficient documentation

## 2018-06-26 DIAGNOSIS — R1013 Epigastric pain: Secondary | ICD-10-CM | POA: Diagnosis not present

## 2018-06-26 DIAGNOSIS — R6881 Early satiety: Secondary | ICD-10-CM

## 2018-06-26 DIAGNOSIS — R110 Nausea: Secondary | ICD-10-CM | POA: Diagnosis not present

## 2018-06-26 DIAGNOSIS — R142 Eructation: Secondary | ICD-10-CM | POA: Insufficient documentation

## 2018-06-26 LAB — CBC WITH DIFFERENTIAL (CANCER CENTER ONLY)
Abs Immature Granulocytes: 0.04 10*3/uL (ref 0.00–0.07)
Basophils Absolute: 0.1 10*3/uL (ref 0.0–0.1)
Basophils Relative: 1 %
Eosinophils Absolute: 0.1 10*3/uL (ref 0.0–0.5)
Eosinophils Relative: 1 %
HCT: 35.8 % — ABNORMAL LOW (ref 36.0–46.0)
HEMOGLOBIN: 10.7 g/dL — AB (ref 12.0–15.0)
Immature Granulocytes: 1 %
Lymphocytes Relative: 18 %
Lymphs Abs: 1.3 10*3/uL (ref 0.7–4.0)
MCH: 22.7 pg — ABNORMAL LOW (ref 26.0–34.0)
MCHC: 29.9 g/dL — ABNORMAL LOW (ref 30.0–36.0)
MCV: 75.8 fL — ABNORMAL LOW (ref 80.0–100.0)
MONOS PCT: 8 %
Monocytes Absolute: 0.6 10*3/uL (ref 0.1–1.0)
Neutro Abs: 5.3 10*3/uL (ref 1.7–7.7)
Neutrophils Relative %: 71 %
Platelet Count: 336 10*3/uL (ref 150–400)
RBC: 4.72 MIL/uL (ref 3.87–5.11)
RDW: 15.5 % (ref 11.5–15.5)
WBC Count: 7.4 10*3/uL (ref 4.0–10.5)
nRBC: 0 % (ref 0.0–0.2)

## 2018-06-26 LAB — RETICULOCYTES
Immature Retic Fract: 13.9 % (ref 2.3–15.9)
RBC.: 4.72 MIL/uL (ref 3.87–5.11)
RETIC CT PCT: 1.3 % (ref 0.4–3.1)
Retic Count, Absolute: 61.4 10*3/uL (ref 19.0–186.0)

## 2018-06-26 LAB — CMP (CANCER CENTER ONLY)
ALT: 16 U/L (ref 0–44)
AST: 16 U/L (ref 15–41)
Albumin: 4.5 g/dL (ref 3.5–5.0)
Alkaline Phosphatase: 98 U/L (ref 38–126)
Anion gap: 10 (ref 5–15)
BUN: 18 mg/dL (ref 8–23)
CO2: 28 mmol/L (ref 22–32)
Calcium: 9.7 mg/dL (ref 8.9–10.3)
Chloride: 101 mmol/L (ref 98–111)
Creatinine: 0.98 mg/dL (ref 0.44–1.00)
GFR, Est AFR Am: >60 mL/min (ref >60)
Glucose, Bld: 162 mg/dL — ABNORMAL HIGH (ref 70–99)
Potassium: 3.4 mmol/L — ABNORMAL LOW (ref 3.5–5.1)
Sodium: 139 mmol/L (ref 135–145)
Total Bilirubin: 0.5 mg/dL (ref 0.3–1.2)
Total Protein: 7.3 g/dL (ref 6.5–8.1)

## 2018-06-26 LAB — SAVE SMEAR(SSMR), FOR PROVIDER SLIDE REVIEW

## 2018-06-26 LAB — VITAMIN B12: Vitamin B-12: 268 pg/mL (ref 180–914)

## 2018-06-26 MED ORDER — METOCLOPRAMIDE HCL 5 MG PO TABS: 5.0000 mg | ORAL_TABLET | Freq: Four times a day (QID) | ORAL | 1 refills | Status: DC

## 2018-06-26 MED ORDER — PANTOPRAZOLE SODIUM 40 MG PO TBEC: 40.0000 mg | DELAYED_RELEASE_TABLET | Freq: Two times a day (BID) | ORAL | 1 refills | Status: DC

## 2018-06-26 MED ORDER — FAMOTIDINE 20 MG PO TABS: 20.0000 mg | ORAL_TABLET | Freq: Every day | ORAL | 3 refills | Status: DC

## 2018-06-26 NOTE — Patient Instructions (Addendum)
If you are age 64 or older, your body mass index should be between 23-30. Your Body mass index is 26.06 kg/m. If this is out of the aforementioned range listed, please consider follow up with your Primary Care Provider.  If you are age 32 or younger, your body mass index should be between 19-25. Your Body mass index is 26.06 kg/m. If this is out of the aformentioned range listed, please consider follow up with your Primary Care Provider.   We have sent the following medications to your pharmacy for you to pick up at your convenience: Reglan  We have sent the following prescriptions to your mail in pharmacy:   If you have not heard from your mail in pharmacy within 1 week or if you have not received your medication in the mail, please contact us at [phone] so we may find out why. Pantoprazole Pepcid  Follow up with Dr. Carlean Purl on July 27, 2018 at 1:30 pm.  Thank you for choosing me and [facility].   Alonza Bogus, PA-C

## 2018-06-26 NOTE — Progress Notes (Addendum)
06/26/2018 [name] [name] [id number] [birth year]   HISTORY OF PRESENT ILLNESS: This is a 63 year old female who is a patient of Dr. Celesta Aver.  She presents to our office today with complaints of epigastric abdominal pain, nausea, vomiting, belching, early satiety, and reflux.  She does apparently have history of diabetic gastroparesis.  She says that all the symptoms began on December 31 and that she had vomiting on a daily basis until January 11.  The vomiting has since resolved, but she has continued with some nausea as well as belching and reflux.  She also describes that when she eats it feels like food sits like a ton of bricks in her upper abdomen.  She is on pantoprazole 40 mg daily and Pepcid 20 mg daily but takes them both in the evening before dinner.  Of note,t she was due for colonoscopy last year, but this was deferred as she was having some cardiac issues.  She then she had drug-eluting stents placed in June 2019 so procedures have been deferred since that time due to her requirement for antiplatelet therapy.  She also does have anemia with severe iron deficiency.  She saw Dr. Marin Olp for evaluation this morning.  He has her on oral iron supplements but has mentioned possible IV iron treatment pending the results of her other lab work-up.  This may all need GI evaluation at some point as well when she is able to safely stop her antiplatelet therapy.   Past Medical History:  Diagnosis Date  . Asthma   . Chronic pain disorder    neck and shoulder  . CTS (carpal tunnel syndrome) 01/27/2013   right  . Diabetes mellitus type II   . Diabetic gastroparesis ([place])   . Esophageal reflux 08/25/2013   [place], Dr Barth Kirks  . FATTY LIVER DISEASE 04/27/2007   Qualifier: Diagnosis of  By: Danelle Earthly CMA, Darlene    . Fundic gland polyps of stomach, benign   . HTN (hypertension)   . Hyperlipidemia, mixed 05/04/2007   Qualifier: Diagnosis of  By: Wynona Luna crestor caused  myalgias even at low dose Livalo caused myalgias Lipitor  Mother with severe reaction myalgias Simvastatin, Welchol   . Hypertension 08/24/2010  . Hypokalemia 05/25/2013   Improved stopping HCTZ. Was noted to have an elevated glucose when K was low. Recheck renal next week after starting Maxzide  . Hypomagnesemia 08/10/2014  . Hypothyroidism   . Laryngitis 08/25/2017  . Leg cramps 04/15/2016  . Nausea without vomiting 08/08/2014  . NONSPEC ELEVATION OF LEVELS OF TRANSAMINASE/LDH 05/01/2007   Qualifier: Diagnosis of  By: Garner Gavel    . Osteoarthritis    chronic, right knee (11/04/2017)  . OVARIAN CYST 07/11/2009   Qualifier: Diagnosis of  By: Wynona Luna   . Ovarian cyst   . Plantar fasciitis   . Rosacea 10/22/2016  . Urine incontinence    Past Surgical History:  Procedure Laterality Date  . AUGMENTATION MAMMAPLASTY Bilateral 2002  . BLADDER SUSPENSION  1981  . COLONOSCOPY    . CORONARY STENT INTERVENTION N/A 11/04/2017   Procedure: CORONARY STENT INTERVENTION;  Surgeon: Nelva Bush, MD;  Location: [place] [facility];  Service: Cardiovascular;  Laterality: N/A;  . ESOPHAGOGASTRODUODENOSCOPY    . INTRAVASCULAR PRESSURE WIRE/FFR STUDY N/A 11/04/2017   Procedure: INTRAVASCULAR PRESSURE WIRE/FFR STUDY;  Surgeon: Nelva Bush, MD;  Location: [place] [facility];  Service: Cardiovascular;  Laterality: N/A;  . INTRAVASCULAR ULTRASOUND/IVUS N/A 11/04/2017  Procedure: Intravascular Ultrasound/IVUS;  Surgeon: Nelva Bush, MD;  Location: [place] [facility];  Service: Cardiovascular;  Laterality: N/A;  . KNEE ARTHROSCOPY  1998, [id number]  . LAPAROSCOPIC CHOLECYSTECTOMY  2007  . LEFT HEART CATH AND CORONARY ANGIOGRAPHY N/A 11/04/2017   Procedure: LEFT HEART CATH AND CORONARY ANGIOGRAPHY;  Surgeon: Nelva Bush, MD;  Location: [place] [facility];  Service: Cardiovascular;  Laterality: N/A;  . LEFT HEART CATHETERIZATION WITH CORONARY ANGIOGRAM N/A 11/21/2011   Procedure: LEFT HEART  CATHETERIZATION WITH CORONARY ANGIOGRAM;  Surgeon: Josue Hector, MD;  Location: [facility] LAB;  Service: Cardiovascular;  Laterality: N/A;  . TONSILLECTOMY  1975  . VAGINAL HYSTERECTOMY  1983   "still have my ovaries"    reports that she has never smoked. She has never used smokeless tobacco. She reports previous alcohol use. She reports that she does not use drugs. family history includes Alzheimer's disease in her mother; COPD in her father; Depression in her sister; Diabetes in her daughter, mother, and sister; Diabetes type II in her mother; Emphysema in her father; Hiatal hernia in her mother. Allergies  Allergen Reactions  . Epinephrine Other (See Comments)    Pass out   . Repatha [Evolocumab] Nausea And Vomiting  . Statins Other (See Comments)    Myalgias: Simvastatin also caused back ache  . Welchol [Colesevelam Hcl]     myalgia  . Amoxicillin Itching and Rash    Has patient had a PCN reaction causing immediate rash, facial/tongue/throat swelling, SOB or lightheadedness with hypotension: No Has patient had a PCN reaction causing severe rash involving mucus membranes or skin necrosis: No Has patient had a PCN reaction that required hospitalization: No Has patient had a PCN reaction occurring within the last 10 years: Yes If all of the above answers are "NO", then may proceed with Cephalosporin use.       Outpatient Encounter Medications as of 06/26/2018  Medication Sig  . ACCU-CHEK FASTCLIX LANCETS MISC CHECK BLOOD SUGAR ONCE     DAILY  . aspirin 81 MG tablet Take 81 mg by mouth daily.   . B-D ULTRAFINE III SHORT PEN 31G X 8 MM MISC INJECT ONCE DAILY  . Calcium Carbonate (CALTRATE 600 PO) Take 600 mg by mouth 2 (two) times daily.   . famotidine (PEPCID) 20 MG tablet Take 1 tablet (20 mg total) by mouth daily.  Marland Kitchen FARXIGA 10 MG TABS tablet TAKE 1 TABLET EVERY DAY  . fenofibrate 160 MG tablet TAKE 1 TABLET BY MOUTH EVERY DAY  . Glucosamine-Chondroitin (OSTEO BI-FLEX REGULAR  STRENGTH) 250-200 MG TABS Take 1 tablet by mouth 2 (two) times daily.   Marland Kitchen glucose blood (ACCU-CHEK SMARTVIEW) test strip Check blood sugars once daily  . Iron Polysacch Cmplx-B12-FA (POLY-IRON 150 FORTE) 150-0.025-1 MG CAPS Take 1 capsule by mouth daily.  Javier Docker Oil 350 MG CAPS Take 350 mg by mouth daily.   Marland Kitchen levothyroxine (SYNTHROID, LEVOTHROID) 88 MCG tablet 1 tab po daily except on  Sat take 2 tabs  . Magnesium 250 MG TABS Take 250-500 mg by mouth See admin instructions. Take 250mg  by mouth Sunday, Tuesday, Thursday and Saturday and 500mg  Monday, Wednesday and Friday  . metFORMIN (GLUCOPHAGE) 500 MG tablet Take 1 tablet (500 mg total) by mouth daily with breakfast.  . metoprolol tartrate (LOPRESSOR) 25 MG tablet Take 1 tablet (25 mg total) by mouth 2 (two) times daily.  . nitroGLYCERIN (NITROSTAT) 0.4 MG SL tablet Place 1 tablet (0.4 mg total) under the tongue  every 5 (five) minutes as needed for chest pain.  Marland Kitchen ondansetron (ZOFRAN) 4 MG tablet Take 1 tablet (4 mg total) by mouth every 8 (eight) hours as needed for nausea or vomiting.  . pantoprazole (PROTONIX) 40 MG tablet Take 1 tablet (40 mg total) by mouth daily.  . pioglitazone (ACTOS) 30 MG tablet TAKE 1 TABLET BY MOUTH EVERY DAY  . POTASSIUM CITRATE PO Take 99 mg by mouth daily.   . Probiotic Product (PROBIOTIC DAILY PO) Take 99 mg by mouth daily.   . repaglinide (PRANDIN) 0.5 MG tablet TAKE 1 TABLET (0.5 MG TOTAL) BY MOUTH DAILY WITH SUPPER.  . ticagrelor (BRILINTA) 90 MG TABS tablet Take 1 tablet (90 mg total) by mouth 2 (two) times daily.  Marland Kitchen triamterene-hydrochlorothiazide (MAXZIDE-25) 37.5-25 MG tablet Take 0.5 tablets by mouth daily.  Marland Kitchen VICTOZA 18 MG/3ML SOPN INJECT 1.8MG (0.3ML) DAILY FOR 90 DAYS  . diltiazem (CARDIZEM CD) 240 MG 24 hr capsule Take 1 capsule (240 mg total) by mouth daily.  . [DISCONTINUED] sucralfate (CARAFATE) 1 GM/10ML suspension Take 10 mLs (1 g total) by mouth 4 (four) times daily -  with meals and at bedtime.     No facility-administered encounter medications on file as of 06/26/2018.      REVIEW OF SYSTEMS  : All other systems reviewed and negative except where noted in the History of Present Illness.   PHYSICAL EXAM: BP 110/60   Pulse 82   Ht 5' 5.5" (1.664 m)   Wt 159 lb (72.1 kg)   BMI 26.06 kg/m  General: Well developed white female in no acute distress Head: Normocephalic and atraumatic Eyes:  Sclerae anicteric, conjunctiva pink. Ears: Normal auditory acuity. Lungs: Clear throughout to auscultation; no increased WOB. Heart: Regular rate and rhythm Abdomen: Soft, non-distended.  BS present.  Mild upper abdominal TTP. Musculoskeletal: Symmetrical with no gross deformities  Skin: No lesions on visible extremities Extremities: No edema  Neurological: Alert oriented x 4, grossly non-focal Psychological:  Alert and cooperative. Normal mood and affect  ASSESSMENT AND PLAN: *Nausea, vomiting, early satiety, epigastric pain, belching, GERD:  Has history of gastroparesis.  This could very well have been representative of a flare.  Some symptoms have resolved, no more vomiting, but others still persistent.  Will increase her pantoprazole to 40 mg BID.  She will continue her pepcid as well but will move that to bedtime.  Will also add reglan 5 mg ACHS for a trial over the next 4 weeks or so.  Discussed side effects and will make [place] aware and discontinue the medication if needed.  Otherwise she will follow-up with Dr. Carlean Purl in approximately 4 weeks.   CC:  Ma Hillock, DO  Agree with [name]'s management.  Gatha Mayer, MD, [facility]

## 2018-06-26 NOTE — Progress Notes (Signed)
Referral MD  Reason for Referral: Iron deficiency anemia secondary to malabsorption  Chief Complaint  Patient presents with  . New Patient (Initial Visit)  : My iron has been low.  HPI: [name] [name] is a very nice 63 year old white female.  She is originally from [place], [place].  She has been down in [place] for many years.  She was recently let go from work.  I am very saddened by this.  She has a lot of health issues.  She currently is having digestive issues.  She is seeing gastroenterology this afternoon.  She has longstanding diabetes.  She is not on insulin.  Her hemoglobin A1c has been 6.1.  She apparently had iron studies done recently.  They are found to be low.  Her doctor then put her on oral iron.  On 06/03/2018, her ferritin was 8 with an iron saturation of 6%.  She is had no problems with the oral iron that she is taking.  She has had past surgery.  She has had past cholecystectomy.  She has had no bleeding.  She went through the change of life over 10 years ago.  She does not smoke.  She does not drink.  She is on Protonix and Pepcid.  This is for her stomach.  She has had no rashes.  She has had no cough or shortness of breath.  She does not chew ice.  She has had no mouth sores.  Overall, her performance status is ECOG 1.   Past Medical History:  Diagnosis Date  . Asthma   . Chronic pain disorder    neck and shoulder  . CTS (carpal tunnel syndrome) 01/27/2013   right  . Diabetes mellitus type II   . Diabetic gastroparesis ([place])   . Esophageal reflux 08/25/2013   [facility], Dr Barth Kirks  . FATTY LIVER DISEASE 04/27/2007   Qualifier: Diagnosis of  By: Danelle Earthly CMA, Darlene    . Fundic gland polyps of stomach, benign   . HTN (hypertension)   . Hyperlipidemia, mixed 05/04/2007   Qualifier: Diagnosis of  By: Wynona Luna crestor caused myalgias even at low dose Livalo caused myalgias Lipitor  Mother with severe reaction myalgias Simvastatin,  Welchol   . Hypertension 08/24/2010  . Hypokalemia 05/25/2013   Improved stopping HCTZ. Was noted to have an elevated glucose when K was low. Recheck renal next week after starting Maxzide  . Hypomagnesemia 08/10/2014  . Hypothyroidism   . Laryngitis 08/25/2017  . Leg cramps 04/15/2016  . Nausea without vomiting 08/08/2014  . NONSPEC ELEVATION OF LEVELS OF TRANSAMINASE/LDH 05/01/2007   Qualifier: Diagnosis of  By: Garner Gavel    . Osteoarthritis    chronic, right knee (11/04/2017)  . OVARIAN CYST 07/11/2009   Qualifier: Diagnosis of  By: Wynona Luna   . Ovarian cyst   . Plantar fasciitis   . Rosacea 10/22/2016  . Urine incontinence   :  Past Surgical History:  Procedure Laterality Date  . AUGMENTATION MAMMAPLASTY Bilateral 2002  . BLADDER SUSPENSION  1981  . COLONOSCOPY    . CORONARY STENT INTERVENTION N/A 11/04/2017   Procedure: CORONARY STENT INTERVENTION;  Surgeon: Nelva Bush, MD;  Location: [facility];  Service: Cardiovascular;  Laterality: N/A;  . ESOPHAGOGASTRODUODENOSCOPY    . INTRAVASCULAR PRESSURE WIRE/FFR STUDY N/A 11/04/2017   Procedure: INTRAVASCULAR PRESSURE WIRE/FFR STUDY;  Surgeon: Nelva Bush, MD;  Location: [facility];  Service: Cardiovascular;  Laterality: N/A;  . INTRAVASCULAR  ULTRASOUND/IVUS N/A 11/04/2017   Procedure: Intravascular Ultrasound/IVUS;  Surgeon: Nelva Bush, MD;  Location: [facility];  Service: Cardiovascular;  Laterality: N/A;  . KNEE ARTHROSCOPY  1998, [id number]  . LAPAROSCOPIC CHOLECYSTECTOMY  2007  . LEFT HEART CATH AND CORONARY ANGIOGRAPHY N/A 11/04/2017   Procedure: LEFT HEART CATH AND CORONARY ANGIOGRAPHY;  Surgeon: Nelva Bush, MD;  Location: [facility];  Service: Cardiovascular;  Laterality: N/A;  . LEFT HEART CATHETERIZATION WITH CORONARY ANGIOGRAM N/A 11/21/2011   Procedure: LEFT HEART CATHETERIZATION WITH CORONARY ANGIOGRAM;  Surgeon: Josue Hector, MD;  [facility];  Service:  Cardiovascular;  Laterality: N/A;  . TONSILLECTOMY  1975  . VAGINAL HYSTERECTOMY  1983   "still have my ovaries"  :   Current Outpatient Medications:  .  ACCU-CHEK FASTCLIX LANCETS MISC, CHECK BLOOD SUGAR ONCE     DAILY, Disp: 102 each, Rfl: 3 .  aspirin 81 MG tablet, Take 81 mg by mouth daily. , Disp: , Rfl:  .  B-D ULTRAFINE III SHORT PEN 31G X 8 MM MISC, INJECT ONCE DAILY, Disp: 100 each, Rfl: 3 .  Calcium Carbonate (CALTRATE 600 PO), Take 600 mg by mouth 2 (two) times daily. , Disp: , Rfl:  .  diltiazem (CARDIZEM CD) 240 MG 24 hr capsule, Take 1 capsule (240 mg total) by mouth daily., Disp: 90 capsule, Rfl: 3 .  famotidine (PEPCID) 20 MG tablet, Take 1 tablet (20 mg total) by mouth daily., Disp: 90 tablet, Rfl: 3 .  FARXIGA 10 MG TABS tablet, TAKE 1 TABLET EVERY DAY, Disp: 90 tablet, Rfl: 3 .  fenofibrate 160 MG tablet, TAKE 1 TABLET BY MOUTH EVERY DAY, Disp: 90 tablet, Rfl: 0 .  Glucosamine-Chondroitin (OSTEO BI-FLEX REGULAR STRENGTH) 250-200 MG TABS, Take 1 tablet by mouth 2 (two) times daily. , Disp: , Rfl:  .  glucose blood (ACCU-CHEK SMARTVIEW) test strip, Check blood sugars once daily, Disp: 100 each, Rfl: 12 .  Iron Polysacch Cmplx-B12-FA (POLY-IRON 150 FORTE) 150-0.025-1 MG CAPS, Take 1 capsule by mouth daily., Disp: 90 each, Rfl: 0 .  Krill Oil 350 MG CAPS, Take 350 mg by mouth daily. , Disp: , Rfl:  .  levothyroxine (SYNTHROID, LEVOTHROID) 88 MCG tablet, 1 tab po daily except on  Sat take 2 tabs, Disp: 93 tablet, Rfl: 3 .  Magnesium 250 MG TABS, Take 250-500 mg by mouth See admin instructions. Take 273m by mouth Sunday, Tuesday, Thursday and Saturday and 5017mMonday, Wednesday and Friday, Disp: , Rfl:  .  metFORMIN (GLUCOPHAGE) 500 MG tablet, Take 1 tablet (500 mg total) by mouth daily with breakfast., Disp: 90 tablet, Rfl: 3 .  metoprolol tartrate (LOPRESSOR) 25 MG tablet, Take 1 tablet (25 mg total) by mouth 2 (two) times daily., Disp: 180 tablet, Rfl: 3 .  nitroGLYCERIN  (NITROSTAT) 0.4 MG SL tablet, Place 1 tablet (0.4 mg total) under the tongue every 5 (five) minutes as needed for chest pain., Disp: 50 tablet, Rfl: 3 .  ondansetron (ZOFRAN) 4 MG tablet, Take 1 tablet (4 mg total) by mouth every 8 (eight) hours as needed for nausea or vomiting., Disp: 20 tablet, Rfl: 0 .  pantoprazole (PROTONIX) 40 MG tablet, Take 1 tablet (40 mg total) by mouth daily., Disp: 90 tablet, Rfl: 3 .  pioglitazone (ACTOS) 30 MG tablet, TAKE 1 TABLET BY MOUTH EVERY DAY, Disp: 90 tablet, Rfl: 3 .  POTASSIUM CITRATE PO, Take 99 mg by mouth daily. , Disp: , Rfl:  .  Probiotic Product (PROBIOTIC DAILY PO), Take 99 mg by mouth daily. , Disp: , Rfl:  .  repaglinide (PRANDIN) 0.5 MG tablet, TAKE 1 TABLET (0.5 MG TOTAL) BY MOUTH DAILY WITH SUPPER., Disp: 90 tablet, Rfl: 0 .  sucralfate (CARAFATE) 1 GM/10ML suspension, Take 10 mLs (1 g total) by mouth 4 (four) times daily -  with meals and at bedtime., Disp: 420 mL, Rfl: 0 .  ticagrelor (BRILINTA) 90 MG TABS tablet, Take 1 tablet (90 mg total) by mouth 2 (two) times daily., Disp: 180 tablet, Rfl: 3 .  triamterene-hydrochlorothiazide (MAXZIDE-25) 37.5-25 MG tablet, Take 0.5 tablets by mouth daily., Disp: 45 tablet, Rfl: 1 .  VICTOZA 18 MG/3ML SOPN, INJECT 1.8MG(0.3ML) DAILY FOR 90 DAYS, Disp: 9 mL, Rfl: 3:  :  Allergies  Allergen Reactions  . Epinephrine Other (See Comments)    Pass out   . Repatha [Evolocumab] Nausea And Vomiting  . Statins Other (See Comments)    Myalgias: Simvastatin also caused back ache  . Welchol [Colesevelam Hcl]     myalgia  . Amoxicillin Itching and Rash    Has patient had a PCN reaction causing immediate rash, facial/tongue/throat swelling, SOB or lightheadedness with hypotension: No Has patient had a PCN reaction causing severe rash involving mucus membranes or skin necrosis: No Has patient had a PCN reaction that required hospitalization: No Has patient had a PCN reaction occurring within the last 10 years:  Yes If all of the above answers are "NO", then may proceed with Cephalosporin use.   :  Family History  Problem Relation Age of Onset  . Alzheimer's disease Mother   . Diabetes type II Mother   . Hiatal hernia Mother   . Diabetes Mother   . Emphysema Father   . COPD Father   . Depression Sister        suicide  . Diabetes Sister   . Diabetes Daughter   . Colon cancer Neg Hx   . Stomach cancer Neg Hx   :  Social History   Socioeconomic History  . Marital status: Married    Spouse name: Not on file  . Number of children: 1  . Years of education: Not on file  . Highest education level: Not on file  Occupational History  . Occupation: IT TECH, admin. assistant    Employer: [employer]  Social Needs  . Financial resource strain: Not on file  . Food insecurity:    Worry: Not on file    Inability: Not on file  . Transportation needs:    Medical: Not on file    Non-medical: Not on file  Tobacco Use  . Smoking status: Never Smoker  . Smokeless tobacco: Never Used  Substance and Sexual Activity  . Alcohol use: Not Currently  . Drug use: Never  . Sexual activity: Not Currently    Partners: Male  Lifestyle  . Physical activity:    Days per week: Not on file    Minutes per session: Not on file  . Stress: Not on file  Relationships  . Social connections:    Talks on phone: Not on file    Gets together: Not on file    Attends religious service: Not on file    Active member of club or organization: Not on file    Attends meetings of clubs or organizations: Not on file    Relationship status: Not on file  . Intimate partner violence:    Fear of current or ex partner: Not  on file    Emotionally abused: Not on file    Physically abused: Not on file    Forced sexual activity: Not on file  Other Topics Concern  . Not on file  Social History Narrative   Marital status/children/pets: Married, 1 child.    Education/employment: HS   Safety:      -smoke alarm in the  home:Yes     - wears seatbelt: Yes     - Feels safe in their relationships: Yes  :  Review of Systems  Constitutional: Negative.   HENT: Negative.   Eyes: Negative.   Respiratory: Negative.   Cardiovascular: Negative.   Gastrointestinal: Positive for abdominal pain, nausea and vomiting.  Genitourinary: Negative.   Musculoskeletal: Negative.   Skin: Negative.   Neurological: Negative.   Endo/Heme/Allergies: Negative.   Psychiatric/Behavioral: Negative.      Exam: Well-developed and well-nourished white female in no obvious distress.  Vital signs show temperature of 97.6.  Pulse 76.  Blood pressure 116/54.  Weight is 158 pounds.  Head neck exam shows no ocular or oral lesions.  She has no palpable cervical or supra clavicular lymph nodes.  She has no conjunctival pallor.  Thyroid is nonpalpable.  Lungs are clear bilaterally.  Cardiac exam regular rate and rhythm with no murmurs, rubs or bruits.  Abdomen is soft.  She has good bowel sounds.  There is no guarding or rebound tenderness.  She has no palpable liver or spleen tip.  Back exam shows no tenderness over the spine, ribs or hips.  Extremities shows no clubbing, cyanosis or edema.  Neurological exam shows no focal neurological deficits.  Skin exam shows no rashes, ecchymoses or petechia.  I see no suspicious hyperpigmented nevi. _0 @   Recent Labs    06/26/18 1039  WBC 7.4  HGB 10.7*  HCT 35.8*  PLT 336   Recent Labs    06/26/18 1039  NA 139  K 3.4*  CL 101  CO2 28  GLUCOSE 162*  BUN 18  CREATININE 0.98  CALCIUM 9.7    Blood smear review: Normochromic and normocytic population of red blood cells.  There may be some microcytic red blood cells.  I see no nucleated red blood cells.  There is no rouleaux formation.  I see no spherocytes or schistocytes's.  There is no polychromasia.  White blood cells do show slight increase in hypersegmented polys.  I see no blasts.  Platelets are adequate number and  size.  Pathology: None    Assessment and Plan: [name] [name] is a very nice 63 year old white female.  She has iron deficiency.  Her MCV is low.  As such, I would believe that her iron probably is still low.  I am not sure if she is going to absorb iron all that well given the fact that she is on Pepcid and Protonix.  I am checking a vitamin B12 level on her.  I am doing this because she does have some increasing hypersegmented polys.  Given that she has both hypothyroidism and diabetes, she may have another autoimmune disease, that being pernicious anemia.  I told her that if we find that she does not improve with her hemoglobin, we see her back in 6 weeks, then we probably would be at a point that we have to consider IV iron.  I do not see any indication for a bone marrow biopsy.    I spent about 45 minutes with [name] [name].  All the time spent face-to-face  with her.  I answered her questions.  I reassured her that I just do not think that we were dealing with a serious problem and that oral iron or IV iron will be able to help her.  I would like to see her back in 6 weeks, or sooner if there is an issue.

## 2018-06-27 LAB — ERYTHROPOIETIN: Erythropoietin: 21.7 m[IU]/mL — ABNORMAL HIGH (ref 2.6–18.5)

## 2018-06-28 ENCOUNTER — Encounter: Payer: Self-pay | Admitting: Endocrinology

## 2018-06-29 ENCOUNTER — Encounter: Payer: Self-pay | Admitting: *Deleted

## 2018-06-29 ENCOUNTER — Other Ambulatory Visit: Payer: Self-pay

## 2018-06-29 ENCOUNTER — Other Ambulatory Visit: Payer: Self-pay | Admitting: Family Medicine

## 2018-06-29 DIAGNOSIS — R112 Nausea with vomiting, unspecified: Secondary | ICD-10-CM

## 2018-06-29 LAB — IRON AND TIBC
Iron: 29 ug/dL — ABNORMAL LOW (ref 41–142)
Saturation Ratios: 6 % — ABNORMAL LOW (ref 21–57)
TIBC: 505 ug/dL — ABNORMAL HIGH (ref 236–444)
UIBC: 476 ug/dL — ABNORMAL HIGH (ref 120–384)

## 2018-06-29 LAB — HEMOGLOBINOPATHY EVALUATION
HGB F QUANT: 0 % (ref 0.0–2.0)
Hgb A2 Quant: 1.7 % — ABNORMAL LOW (ref 1.8–3.2)
Hgb A: 98.3 % (ref 96.4–98.8)
Hgb C: 0 %
Hgb S Quant: 0 %
Hgb Variant: 0 %

## 2018-06-29 LAB — FERRITIN: Ferritin: 4 ng/mL — ABNORMAL LOW (ref 11–307)

## 2018-06-29 MED ORDER — INSULIN PEN NEEDLE 31G X 8 MM MISC: 3 refills | Status: DC

## 2018-07-07 ENCOUNTER — Encounter: Payer: Self-pay | Admitting: Family Medicine

## 2018-07-07 ENCOUNTER — Ambulatory Visit: Payer: BLUE CROSS/BLUE SHIELD | Admitting: Family Medicine

## 2018-07-07 VITALS — BP 98/62 | HR 78 | Temp 97.8°F | Resp 16 | Ht 66.0 in | Wt 160.0 lb

## 2018-07-07 DIAGNOSIS — D509 Iron deficiency anemia, unspecified: Secondary | ICD-10-CM | POA: Diagnosis not present

## 2018-07-07 DIAGNOSIS — R1013 Epigastric pain: Secondary | ICD-10-CM

## 2018-07-07 NOTE — Progress Notes (Signed)
[name] [name] , 1955-06-30, 63 y.o., female MRN: [id number] Patient Care Team    Relationship Specialty Notifications Start End  Ma Hillock, DO PCP - General Family Medicine  06/03/18   Minus Breeding, MD PCP - Cardiology Cardiology Admissions 11/14/17   Gatha Mayer, MD Consulting Physician Gastroenterology  06/05/18   Marshell Garfinkel, MD Consulting Physician Pulmonary Disease  06/05/18   Renato Shin, MD Consulting Physician Endocrinology  06/05/18     Chief Complaint  Patient presents with  . Mass    Between breast. Gets bigger throughout the day and smaller at night. Some pain. Not fasting.      Subjective: Pt presents for an OV with complaints of "knot" between her breast by her stomach discomfort of 2-4 weeks  duration.  Associated symptoms include nausea, vomit, pain. She was seen 06/01/2018 for epigastric pain, nausea and vomit-- since that time she has been seen by GI, ENT and HEME. ENT for pill dysphagia, GI for epigastric pain which increased her medications, HEME 2/2 to new anemia and iron rather significant iron deficiency while on ASA and brilinta with ABD pain and h/o gastritis. She reports the addition of Reglan has made a positive difference, but she still has some symptoms. She had 3 episodes of vomiting last week before able to start Reglan. She has been able to put weight back on that she had lost from symptoms at the beginning of the year. She has also been eating as soon as she awakes and this has also helped. She has frequent loose stools. Her last colonoscopy was 2009- but has been put on hold for repeat secondary to anticoagulation which is scheduled to be stopped in June.   Depression screen [facility] 2/9 06/03/2018 09/06/2014  Decreased Interest 2 0  Down, Depressed, Hopeless 1 0  PHQ - 2 Score 3 0  Altered sleeping 0 -  Tired, decreased energy 2 -  Change in appetite 3 -  Feeling bad or failure about yourself  0 -  Trouble concentrating 0 -  Moving slowly or  fidgety/restless 0 -  Suicidal thoughts 0 -  PHQ-9 Score 8 -  Some recent data might be hidden    Allergies  Allergen Reactions  . Epinephrine Other (See Comments)    Pass out   . Repatha [Evolocumab] Nausea And Vomiting  . Statins Other (See Comments)    Myalgias: Simvastatin also caused back ache  . Welchol [Colesevelam Hcl]     myalgia  . Amoxicillin Itching and Rash    Has patient had a PCN reaction causing immediate rash, facial/tongue/throat swelling, SOB or lightheadedness with hypotension: No Has patient had a PCN reaction causing severe rash involving mucus membranes or skin necrosis: No Has patient had a PCN reaction that required hospitalization: No Has patient had a PCN reaction occurring within the last 10 years: Yes If all of the above answers are "NO", then may proceed with Cephalosporin use.    Social History   Social History Narrative   Marital status/children/pets: Married, 1 child.    Education/employment: HS   Safety:      -smoke alarm in the home:Yes     - wears seatbelt: Yes     - Feels safe in their relationships: Yes   Past Medical History:  Diagnosis Date  . Asthma   . Chronic pain disorder    neck and shoulder  . CTS (carpal tunnel syndrome) 01/27/2013   right  . Diabetes mellitus type II   .  Diabetic gastroparesis ([place])   . Esophageal reflux 08/25/2013   [place], Dr Barth Kirks  . FATTY LIVER DISEASE 04/27/2007   Qualifier: Diagnosis of  By: Danelle Earthly CMA, Darlene    . Fundic gland polyps of stomach, benign   . HTN (hypertension)   . Hyperlipidemia, mixed 05/04/2007   Qualifier: Diagnosis of  By: Wynona Luna crestor caused myalgias even at low dose Livalo caused myalgias Lipitor  Mother with severe reaction myalgias Simvastatin, Welchol   . Hypertension 08/24/2010  . Hypokalemia 05/25/2013   Improved stopping HCTZ. Was noted to have an elevated glucose when K was low. Recheck renal next week after starting Maxzide  . Hypomagnesemia  08/10/2014  . Hypothyroidism   . Laryngitis 08/25/2017  . Leg cramps 04/15/2016  . Nausea without vomiting 08/08/2014  . NONSPEC ELEVATION OF LEVELS OF TRANSAMINASE/LDH 05/01/2007   Qualifier: Diagnosis of  By: Garner Gavel    . Osteoarthritis    chronic, right knee (11/04/2017)  . OVARIAN CYST 07/11/2009   Qualifier: Diagnosis of  By: Wynona Luna   . Ovarian cyst   . Plantar fasciitis   . Rosacea 10/22/2016  . Urine incontinence    Past Surgical History:  Procedure Laterality Date  . AUGMENTATION MAMMAPLASTY Bilateral 2002  . BLADDER SUSPENSION  1981  . COLONOSCOPY    . CORONARY STENT INTERVENTION N/A 11/04/2017   Procedure: CORONARY STENT INTERVENTION;  Surgeon: Nelva Bush, MD;  Location: [facility];  Service: Cardiovascular;  Laterality: N/A;  . ESOPHAGOGASTRODUODENOSCOPY    . INTRAVASCULAR PRESSURE WIRE/FFR STUDY N/A 11/04/2017   Procedure: INTRAVASCULAR PRESSURE WIRE/FFR STUDY;  Surgeon: Nelva Bush, MD;  Location: [facility];  Service: Cardiovascular;  Laterality: N/A;  . INTRAVASCULAR ULTRASOUND/IVUS N/A 11/04/2017   Procedure: Intravascular Ultrasound/IVUS;  Surgeon: Nelva Bush, MD;  Location: [facility];  Service: Cardiovascular;  Laterality: N/A;  . KNEE ARTHROSCOPY  1998, [id number]  . LAPAROSCOPIC CHOLECYSTECTOMY  2007  . LEFT HEART CATH AND CORONARY ANGIOGRAPHY N/A 11/04/2017   Procedure: LEFT HEART CATH AND CORONARY ANGIOGRAPHY;  Surgeon: Nelva Bush, MD;  Location: [place];  Service: Cardiovascular;  Laterality: N/A;  . LEFT HEART CATHETERIZATION WITH CORONARY ANGIOGRAM N/A 11/21/2011   Procedure: LEFT HEART CATHETERIZATION WITH CORONARY ANGIOGRAM;  Surgeon: Josue Hector, MD;  Location[facility];  Service: Cardiovascular;  Laterality: N/A;  . TONSILLECTOMY  1975  . VAGINAL HYSTERECTOMY  1983   "still have my ovaries"   Family History  Problem Relation Age of Onset  . Alzheimer's disease Mother   . Diabetes  type II Mother   . Hiatal hernia Mother   . Diabetes Mother   . Emphysema Father   . COPD Father   . Depression Sister        suicide  . Diabetes Sister   . Diabetes Daughter   . Colon cancer Neg Hx   . Stomach cancer Neg Hx    Allergies as of 07/07/2018      Reactions   Epinephrine Other (See Comments)   Pass out    Repatha [evolocumab] Nausea And Vomiting   Statins Other (See Comments)   Myalgias: Simvastatin also caused back ache   Welchol [colesevelam Hcl]    myalgia   Amoxicillin Itching, Rash   Has patient had a PCN reaction causing immediate rash, facial/tongue/throat swelling, SOB or lightheadedness with hypotension: No Has patient had a PCN reaction causing severe rash involving mucus membranes or skin  necrosis: No Has patient had a PCN reaction that required hospitalization: No Has patient had a PCN reaction occurring within the last 10 years: Yes If all of the above answers are "NO", then may proceed with Cephalosporin use.      Medication List       Accurate as of July 07, 2018  6:07 PM. Always use your most recent med list.        ACCU-CHEK FASTCLIX LANCETS Misc CHECK BLOOD SUGAR ONCE     DAILY   aspirin 81 MG tablet Take 81 mg by mouth daily.   CALTRATE 600 PO Take 600 mg by mouth 2 (two) times daily.   diltiazem 240 MG 24 hr capsule Commonly known as:  CARDIZEM CD Take 1 capsule (240 mg total) by mouth daily.   famotidine 20 MG tablet Commonly known as:  PEPCID Take 1 tablet (20 mg total) by mouth at bedtime.   FARXIGA 10 MG Tabs tablet Generic drug:  dapagliflozin propanediol TAKE 1 TABLET EVERY DAY   fenofibrate 160 MG tablet TAKE 1 TABLET BY MOUTH EVERY DAY   glucose blood test strip Commonly known as:  ACCU-CHEK SMARTVIEW Check blood sugars once daily   Insulin Pen Needle 31G X 8 MM Misc Commonly known as:  B-D ULTRAFINE III SHORT PEN INJECT ONCE DAILY   Iron Polysacch Cmplx-B12-FA 150-0.025-1 MG Caps Commonly known as:   POLY-IRON 150 FORTE Take 1 capsule by mouth daily.   Krill Oil 350 MG Caps Take 350 mg by mouth daily.   levothyroxine 88 MCG tablet Commonly known as:  SYNTHROID, LEVOTHROID 1 tab po daily except on  Sat take 2 tabs   Magnesium 250 MG Tabs Take 250-500 mg by mouth See admin instructions. Take 250mg  by mouth Sunday, Tuesday, Thursday and Saturday and 500mg  Monday, Wednesday and Friday   metFORMIN 500 MG tablet Commonly known as:  GLUCOPHAGE Take 1 tablet (500 mg total) by mouth daily with breakfast.   metoCLOPramide 5 MG tablet Commonly known as:  REGLAN Take 1 tablet (5 mg total) by mouth 4 (four) times daily.   metoprolol tartrate 25 MG tablet Commonly known as:  LOPRESSOR Take 1 tablet (25 mg total) by mouth 2 (two) times daily.   nitroGLYCERIN 0.4 MG SL tablet Commonly known as:  NITROSTAT Place 1 tablet (0.4 mg total) under the tongue every 5 (five) minutes as needed for chest pain.   ondansetron 4 MG tablet Commonly known as:  ZOFRAN Take 1 tablet (4 mg total) by mouth every 8 (eight) hours as needed for nausea or vomiting.   OSTEO BI-FLEX REGULAR STRENGTH 250-200 MG Tabs Generic drug:  Glucosamine-Chondroitin Take 1 tablet by mouth 2 (two) times daily.   pantoprazole 40 MG tablet Commonly known as:  PROTONIX Take 1 tablet (40 mg total) by mouth 2 (two) times daily.   pioglitazone 30 MG tablet Commonly known as:  ACTOS TAKE 1 TABLET BY MOUTH EVERY DAY   POTASSIUM CITRATE PO Take 99 mg by mouth daily.   PROBIOTIC DAILY PO Take 99 mg by mouth daily.   repaglinide 0.5 MG tablet Commonly known as:  PRANDIN TAKE 1 TABLET (0.5 MG TOTAL) BY MOUTH DAILY WITH SUPPER.   ticagrelor 90 MG Tabs tablet Commonly known as:  BRILINTA Take 1 tablet (90 mg total) by mouth 2 (two) times daily.   triamterene-hydrochlorothiazide 37.5-25 MG tablet Commonly known as:  MAXZIDE-25 Take 0.5 tablets by mouth daily.   VICTOZA 18 MG/3ML Sopn Generic drug:  liraglutide  INJECT  1.8MG (0.3ML) DAILY FOR 90 DAYS       All past medical history, surgical history, allergies, family history, immunizations andmedications were updated in the EMR today and reviewed under the history and medication portions of their EMR.     ROS: Negative, with the exception of above mentioned in HPI   Objective:  BP 98/62 (BP Location: Left Arm, Patient Position: Sitting, Cuff Size: Normal)   Pulse 78   Temp 97.8 F (36.6 C) (Oral)   Resp 16   Ht 5\' 6"  (1.676 m)   Wt 160 lb (72.6 kg)   SpO2 99%   BMI 25.82 kg/m  Body mass index is 25.82 kg/m. Gen: Afebrile. No acute distress. Nontoxic in appearance, well developed, well nourished.  HENT: AT. [place]. MMM Eyes:Pupils Equal Round Reactive to light, Extraocular movements intact,  Conjunctiva without redness, discharge or icterus. CV: RRR  Chest: CTAB, no wheeze or crackles. Good air movement, normal resp effort.  Abd: Soft. Obese, moderate TTP epigastric, mild TTP diffusely, moderately TTP xiphoid process.  ND. BS present no Masses palpated. No rebound or guarding.  Neuro: Normal gait. PERLA. EOMi. Alert. Oriented x3  No exam data present No results found. No results found for this or any previous visit (from the past 24 hour(s)).  Assessment/Plan: GRACYNN RAJEWSKI is a 63 y.o. female present for OV for  Iron deficiency anemia, unspecified iron deficiency anemia type/Epigastric pain - new anemia- rather significant iron deficiency- on ASA and Brilinta with epigastric pain and "pill dysphagia".  The concern patient presented with today is actually her xiphoid process and not an abnormal mass-reassured.  There is tenderness to the xiphoid process possibly secondary to sliding hiatal hernia/obesity. -SHe has GI follow-up in 2 weeks. - Hemoccult Cards (X3 cards); Future -I have asked her to complete Hemoccult cards to rule out GI loss as cause of anemia and she is on ASA and Brilinta. -Discussed with her the mechanism of hiatal hernia and  this is a possibility of her discomfort.  She agrees this is a possibility.  Small frequent meals suggested.  Discussed with her gastroenterologist on follow-up.    Reviewed expectations re: course of current medical issues.  Discussed self-management of symptoms.  Outlined signs and symptoms indicating need for more acute intervention.  Patient verbalized understanding and all questions were answered.  Patient received an After-Visit Summary.   > 25 minutes spent with patient, >50% of time spent face to face counseling and coordinating care.     Orders Placed This Encounter  Procedures  . Hemoccult Cards (X3 cards)     Note is dictated utilizing voice recognition software. Although note has been proof read prior to signing, occasional typographical errors still can be missed. If any questions arise, please do not hesitate to call for verification.   electronically signed by:  Howard Pouch, DO  [place]

## 2018-07-07 NOTE — Patient Instructions (Signed)
Your pain is over your xiphoid process and upper stomach. The Knot you felt is the xiphoid process and  Past of your sternum.  You may have a hiatal hernia. Please complete the FOBT cards sent home with you and return. We will call you with those results once available.  Small frequent meals can decrease hiatal hernia discomfort, if it is a hernia.  I would mention to your GI on your follow up in 2 weeks.    Hiatal Hernia  A hiatal hernia occurs when part of the stomach slides above the muscle that separates the abdomen from the chest (diaphragm). A person can be born with a hiatal hernia (congenital), or it may develop over time. In almost all cases of hiatal hernia, only the top part of the stomach pushes through the diaphragm. Many people have a hiatal hernia with no symptoms. The larger the hernia, the more likely it is that you will have symptoms. In some cases, a hiatal hernia allows stomach acid to flow back into the tube that carries food from your mouth to your stomach (esophagus). This may cause heartburn symptoms. Severe heartburn symptoms may mean that you have developed a condition called gastroesophageal reflux disease (GERD). What are the causes? This condition is caused by a weakness in the opening (hiatus) where the esophagus passes through the diaphragm to attach to the upper part of the stomach. A person may be born with a weakness in the hiatus, or a weakness can develop over time. What increases the risk? This condition is more likely to develop in:  Older people. Age is a major risk factor for a hiatal hernia, especially if you are over the age of 33.  Pregnant women.  People who are overweight.  People who have frequent constipation. What are the signs or symptoms? Symptoms of this condition usually develop in the form of GERD symptoms. Symptoms include:  Heartburn.  Belching.  Indigestion.  Trouble swallowing.  Coughing or wheezing.  Sore  throat.  Hoarseness.  Chest pain.  Nausea and vomiting. How is this diagnosed? This condition may be diagnosed during testing for GERD. Tests that may be done include:  X-rays of your stomach or chest.  An upper gastrointestinal (GI) series. This is an X-ray exam of your GI tract that is taken after you swallow a chalky liquid that shows up clearly on the X-ray.  Endoscopy. This is a procedure to look into your stomach using a thin, flexible tube that has a tiny camera and light on the end of it. How is this treated? This condition may be treated by:  Dietary and lifestyle changes to help reduce GERD symptoms.  Medicines. These may include: ? Over-the-counter antacids. ? Medicines that make your stomach empty more quickly. ? Medicines that block the production of stomach acid (H2 blockers). ? Stronger medicines to reduce stomach acid (proton pump inhibitors).  Surgery to repair the hernia, if other treatments are not helping. If you have no symptoms, you may not need treatment. Follow these instructions at home: Lifestyle and activity  Do not use any products that contain nicotine or tobacco, such as cigarettes and e-cigarettes. If you need help quitting, ask your health care provider.  Try to achieve and maintain a healthy body weight.  Avoid putting pressure on your abdomen. Anything that puts pressure on your abdomen increases the amount of acid that may be pushed up into your esophagus. ? Avoid bending over, especially after eating. ? Raise the head of  your bed by putting blocks under the legs. This keeps your head and esophagus higher than your stomach. ? Do not wear tight clothing around your chest or stomach. ? Try not to strain when having a bowel movement, when urinating, or when lifting heavy objects. Eating and drinking  Avoid foods that can worsen GERD symptoms. These may include: ? Fatty foods, like fried foods. ? Citrus fruits, like oranges or lemon. ? Other  foods and drinks that contain acid, like orange juice or tomatoes. ? Spicy food. ? Chocolate.  Eat frequent small meals instead of three large meals a day. This helps prevent your stomach from getting too full. ? Eat slowly. ? Do not lie down right after eating. ? Do not eat 1-2 hours before bed.  Do not drink beverages with caffeine. These include cola, coffee, cocoa, and tea.  Do not drink alcohol. General instructions  Take over-the-counter and prescription medicines only as told by your health care provider.  Keep all follow-up visits as told by your health care provider. This is important. Contact a health care provider if:  Your symptoms are not controlled with medicines or lifestyle changes.  You are having trouble swallowing.  You have coughing or wheezing that will not go away. Get help right away if:  Your pain is getting worse.  Your pain spreads to your arms, neck, jaw, teeth, or back.  You have shortness of breath.  You sweat for no reason.  You feel sick to your stomach (nauseous) or you vomit.  You vomit blood.  You have bright red blood in your stools.  You have black, tarry stools. This information is not intended to replace advice given to you by your health care provider. Make sure you discuss any questions you have with your health care provider. Document Released: 08/03/2003 Document Revised: 12/16/2016 Document Reviewed: 12/16/2016 Elsevier Interactive Patient Education  2019 [employer].

## 2018-07-16 ENCOUNTER — Telehealth: Payer: Self-pay

## 2018-07-16 NOTE — Telephone Encounter (Signed)
Called and spoke with patient about paperwork from [place] from [employer]. Pt states she has been using them for years and her nurse just changed and needed updated information and she wanted the info sent to them. She states they just call every few weeks and go over her health. Last OV and med list faxed. Placed on Dr Dierdre Highman desk.

## 2018-07-17 ENCOUNTER — Encounter: Payer: Self-pay | Admitting: Endocrinology

## 2018-07-17 ENCOUNTER — Encounter: Payer: Self-pay | Admitting: Family Medicine

## 2018-07-17 ENCOUNTER — Other Ambulatory Visit: Payer: Self-pay

## 2018-07-17 DIAGNOSIS — E1143 Type 2 diabetes mellitus with diabetic autonomic (poly)neuropathy: Secondary | ICD-10-CM

## 2018-07-17 MED ORDER — GLUCOSE BLOOD VI STRP: ORAL_STRIP | 12 refills | Status: DC

## 2018-07-17 MED ORDER — ACCU-CHEK FASTCLIX LANCETS MISC: 3 refills | Status: DC

## 2018-07-22 ENCOUNTER — Other Ambulatory Visit: Payer: Self-pay | Admitting: Endocrinology

## 2018-07-22 ENCOUNTER — Other Ambulatory Visit: Payer: Self-pay | Admitting: Family Medicine

## 2018-07-22 DIAGNOSIS — E039 Hypothyroidism, unspecified: Secondary | ICD-10-CM

## 2018-07-23 NOTE — Telephone Encounter (Signed)
noted 

## 2018-07-24 ENCOUNTER — Encounter: Payer: Self-pay | Admitting: *Deleted

## 2018-07-24 ENCOUNTER — Other Ambulatory Visit: Payer: BLUE CROSS/BLUE SHIELD

## 2018-07-24 DIAGNOSIS — D509 Iron deficiency anemia, unspecified: Secondary | ICD-10-CM

## 2018-07-24 LAB — HEMOCCULT SLIDES (X 3 CARDS)
Fecal Occult Blood: NEGATIVE
OCCULT 1: NEGATIVE
OCCULT 2: NEGATIVE
OCCULT 3: NEGATIVE
OCCULT 4: NEGATIVE
OCCULT 5: NEGATIVE

## 2018-07-27 ENCOUNTER — Other Ambulatory Visit (INDEPENDENT_AMBULATORY_CARE_PROVIDER_SITE_OTHER): Payer: BLUE CROSS/BLUE SHIELD

## 2018-07-27 ENCOUNTER — Ambulatory Visit: Payer: BLUE CROSS/BLUE SHIELD | Admitting: Internal Medicine

## 2018-07-27 ENCOUNTER — Encounter: Payer: Self-pay | Admitting: Internal Medicine

## 2018-07-27 VITALS — BP 114/60 | HR 78 | Ht 65.5 in | Wt 164.0 lb

## 2018-07-27 DIAGNOSIS — K3184 Gastroparesis: Secondary | ICD-10-CM

## 2018-07-27 DIAGNOSIS — E1143 Type 2 diabetes mellitus with diabetic autonomic (poly)neuropathy: Secondary | ICD-10-CM

## 2018-07-27 DIAGNOSIS — D508 Other iron deficiency anemias: Secondary | ICD-10-CM

## 2018-07-27 LAB — IGA: IgA: 303 mg/dL (ref 68–378)

## 2018-07-27 NOTE — Progress Notes (Signed)
[name] [name] 63 y.o. [birth year] [id number]  Assessment & Plan:   Encounter Diagnoses  Name Primary?  . Diabetic gastroparesis associated with type 2 diabetes mellitus ([place]) Yes  . Other iron deficiency anemia    I think with the vomiting problems she has had in her reliance on metoclopramide which is new (though she has taken it years ago) and the iron deficiency anemia it makes sense to proceed with an EGD.  She is on ticagrelor but I think examination and possible biopsy should be safe though I would not perform coagulator therapy, dilation or snare cautery etc.  I think the risk of bleeding is just slightly increased in the benefits way the risks in this setting.  Colonoscopy with its potential for snare cautery requires holding ticagrelor in my opinion.  I have asked her to minimize and avoid roughage prior to her procedure.  Her iron deficiency could be nutritional as she has never been much of [name] meat eater.  I will check celiac serology and IgA level.  Keep in mind that her Victoza could be causing some of the symptoms, it does delayed gastric emptying.  Consider dietitian though last one she saw told her did not know what to tell her due to her multiple diseases. I did ask her to use gastroparesis diet step 3 today and handout provided.  As far as metaclopramide goes she will stay on it for now and we will determine next steps that could include using domperidone if necessary.    NK:[id number], [name] A, DO  Subjective:   Chief Complaint: vomiting, gastroparesis  HPI The patient is [name] 63 year old white woman with suspected diabetic gastroparesis, last seen by me in 2016 but seen on June 26, 2018 by Alonza Bogus because of epigastric pain and vomiting, here for follow-up.  She has type 2 diabetes mellitus and [name] history of gastroparesis type symptoms throughout the years previously followed by Dr. Sharlett Iles.  She had vomited for 11 days straight from December 30 1 January  11.  She has had persistent nausea belching and reflux symptoms, and if she feels like she is hungry she gets the urge to vomit.  If she does not take metoclopramide she will get that and she has had some vomiting.  She is also concerned about potential side effects of metoclopramide which we reviewed today.  She had [name] drug-eluting stent placed in June 2019 and is on take [place].  She has been diagnosed with iron deficiency anemia in the past year.  She is on oral iron supplements with consideration for parenteral iron depending upon her response.  She has not noticed any bleeding and she had guaiac cards negative x3 through primary care recently. She has not been [name] blood donor.  She has never been [name] big red meat eater.  H. pylori antibodies are negative.  No change in medications.  Hemoglobin A1c in good range. Wt Readings from Last 3 Encounters:  07/27/18 164 lb (74.4 kg)  07/07/18 160 lb (72.6 kg)  06/26/18 159 lb (72.1 kg)      Allergies  Allergen Reactions  . Epinephrine Other (See Comments)    Pass out   . Repatha [Evolocumab] Nausea And Vomiting  . Statins Other (See Comments)    Myalgias: Simvastatin also caused back ache  . Welchol [Colesevelam Hcl]     myalgia  . Amoxicillin Itching and Rash    Has patient had [name] PCN reaction causing immediate rash, facial/tongue/throat swelling, SOB or lightheadedness  with hypotension: No Has patient had [name] PCN reaction causing severe rash involving mucus membranes or skin necrosis: No Has patient had [name] PCN reaction that required hospitalization: No Has patient had [name] PCN reaction occurring within the last 10 years: Yes If all of the above answers are "NO", then may proceed with Cephalosporin use.    Current Meds  Medication Sig  . ACCU-CHEK FASTCLIX LANCETS MISC CHECK BLOOD SUGAR ONCE     DAILY  . aspirin 81 MG tablet Take 81 mg by mouth daily.   . Calcium Carbonate (CALTRATE 600 PO) Take 600 mg by mouth 2 (two) times daily.   . famotidine  (PEPCID) 20 MG tablet Take 1 tablet (20 mg total) by mouth at bedtime.  Marland Kitchen FARXIGA 10 MG TABS tablet TAKE 1 TABLET EVERY DAY  . fenofibrate 160 MG tablet TAKE 1 TABLET BY MOUTH EVERY DAY  . Glucosamine-Chondroitin (OSTEO BI-FLEX REGULAR STRENGTH) 250-200 MG TABS Take 1 tablet by mouth 2 (two) times daily.   Marland Kitchen glucose blood (ACCU-CHEK SMARTVIEW) test strip Check blood sugars once daily  . Insulin Pen Needle (B-D ULTRAFINE III SHORT PEN) 31G X 8 MM MISC INJECT ONCE DAILY  . Iron Polysacch Cmplx-B12-FA (POLY-IRON 150 FORTE) 150-0.025-1 MG CAPS Take 1 capsule by mouth daily.  [name] [name] Oil 350 MG CAPS Take 350 mg by mouth daily.   Marland Kitchen levothyroxine (SYNTHROID, LEVOTHROID) 88 MCG tablet TAKE 1 TABLET BY MOUTH EVERY DAY  . Magnesium 250 MG TABS Take 250-500 mg by mouth See admin instructions. Take 250mg  by mouth Sunday, Tuesday, Thursday and Saturday and 500mg  Monday, Wednesday and Friday  . metFORMIN (GLUCOPHAGE) 500 MG tablet Take by mouth 2 (two) times daily with [name] meal.  . metoCLOPramide (REGLAN) 5 MG tablet Take 1 tablet (5 mg total) by mouth 4 (four) times daily.  . metoprolol tartrate (LOPRESSOR) 25 MG tablet Take 1 tablet (25 mg total) by mouth 2 (two) times daily.  . nitroGLYCERIN (NITROSTAT) 0.4 MG SL tablet Place 1 tablet (0.4 mg total) under the tongue every 5 (five) minutes as needed for chest pain.  Marland Kitchen ondansetron (ZOFRAN) 4 MG tablet Take 1 tablet (4 mg total) by mouth every 8 (eight) hours as needed for nausea or vomiting.  . pantoprazole (PROTONIX) 40 MG tablet Take 1 tablet (40 mg total) by mouth 2 (two) times daily.  . pioglitazone (ACTOS) 30 MG tablet TAKE 1 TABLET BY MOUTH EVERY DAY  . POTASSIUM CITRATE PO Take 99 mg by mouth daily.   . Probiotic Product (PROBIOTIC DAILY PO) Take 99 mg by mouth daily.   . repaglinide (PRANDIN) 0.5 MG tablet TAKE 1 TABLET (0.5 MG TOTAL) BY MOUTH DAILY WITH SUPPER.  . ticagrelor (BRILINTA) 90 MG TABS tablet Take 1 tablet (90 mg total) by mouth 2 (two)  times daily.  Marland Kitchen triamterene-hydrochlorothiazide (MAXZIDE-25) 37.5-25 MG tablet Take 0.5 tablets by mouth daily.  Marland Kitchen VICTOZA 18 MG/3ML SOPN INJECT 1.8MG (0.3ML) DAILY FOR 90 DAYS   Past Medical History:  Diagnosis Date  . Asthma   . Chronic pain disorder    neck and shoulder  . CTS (carpal tunnel syndrome) 01/27/2013   right  . Diabetes mellitus type II   . Diabetic gastroparesis ([place])   . Esophageal reflux 08/25/2013   [facility] [place], Dr Barth Kirks  . FATTY LIVER DISEASE 04/27/2007   Qualifier: Diagnosis of  By: Danelle Earthly CMA, Darlene    . Fundic gland polyps of stomach, benign   . HTN (hypertension)   .  Hyperlipidemia, mixed 05/04/2007   Qualifier: Diagnosis of  By: Wynona Luna crestor caused myalgias even at low dose Livalo caused myalgias Lipitor  Mother with severe reaction myalgias Simvastatin, Welchol   . Hypertension 08/24/2010  . Hypokalemia 05/25/2013   Improved stopping HCTZ. Was noted to have an elevated glucose when K was low. Recheck renal next week after starting Maxzide  . Hypomagnesemia 08/10/2014  . Hypothyroidism   . Laryngitis 08/25/2017  . Leg cramps 04/15/2016  . Nausea without vomiting 08/08/2014  . NONSPEC ELEVATION OF LEVELS OF TRANSAMINASE/LDH 05/01/2007   Qualifier: Diagnosis of  By: Garner Gavel    . Osteoarthritis    chronic, right knee (11/04/2017)  . OVARIAN CYST 07/11/2009   Qualifier: Diagnosis of  By: Wynona Luna   . Ovarian cyst   . Plantar fasciitis   . Rosacea 10/22/2016  . Urine incontinence    Past Surgical History:  Procedure Laterality Date  . AUGMENTATION MAMMAPLASTY Bilateral 2002  . BLADDER SUSPENSION  1981  . COLONOSCOPY    . CORONARY STENT INTERVENTION N/[name] 11/04/2017   Procedure: CORONARY STENT INTERVENTION;  Surgeon: Nelva Bush, MD;  Location: [place] [facility];  Service: Cardiovascular;  Laterality: N/[name];  . ESOPHAGOGASTRODUODENOSCOPY    . INTRAVASCULAR PRESSURE WIRE/FFR STUDY N/[name] 11/04/2017   Procedure: INTRAVASCULAR  PRESSURE WIRE/FFR STUDY;  Surgeon: Nelva Bush, MD;  Location: [place] [facility];  Service: Cardiovascular;  Laterality: N/[name];  . INTRAVASCULAR ULTRASOUND/IVUS N/[name] 11/04/2017   Procedure: Intravascular Ultrasound/IVUS;  Surgeon: Nelva Bush, MD;  Location: [place] [facility];  Service: Cardiovascular;  Laterality: N/[name];  . KNEE ARTHROSCOPY  1998, [id number]  . LAPAROSCOPIC CHOLECYSTECTOMY  2007  . LEFT HEART CATH AND CORONARY ANGIOGRAPHY N/[name] 11/04/2017   Procedure: LEFT HEART CATH AND CORONARY ANGIOGRAPHY;  Surgeon: Nelva Bush, MD;  Location: [facility];  Service: Cardiovascular;  Laterality: N/[name];  . LEFT HEART CATHETERIZATION WITH CORONARY ANGIOGRAM N/[name] 11/21/2011   Procedure: LEFT HEART CATHETERIZATION WITH CORONARY ANGIOGRAM;  Surgeon: Josue Hector, MD;  Location[facility] C[facility];  Service: Cardiovascular;  Laterality: N/[name];  . TONSILLECTOMY  1975  . VAGINAL HYSTERECTOMY  1983   "still have my ovaries"   Social History   Social History Narrative   Marital status/children/pets: Married, 1 child.    Education/employment: HS   Safety:      -smoke alarm in the home:Yes     - wears seatbelt: Yes     - Feels safe in their relationships: Yes   family history includes Alzheimer's disease in her mother; COPD in her father; Depression in her sister; Diabetes in her daughter, mother, and sister; Diabetes type II in her mother; Emphysema in her father; Hiatal hernia in her mother.   Review of Systems  As per HPI Objective:   Physical Exam @BP  114/60   Pulse 78   Ht 5' 5.5" (1.664 m)   Wt 164 lb (74.4 kg)   BMI 26.88 kg/m @  General:  NAD Eyes:   anicteric Lungs:  clear Heart::  S1S2 no rubs, murmurs or gallops Abdomen:  soft and nontender, BS+ no splash Ext:   no edema, cyanosis or clubbing    Data Reviewed:   See HPI

## 2018-07-27 NOTE — Patient Instructions (Addendum)
You have been scheduled for an endoscopy. Please follow written instructions given to you at your visit today. If you use inhalers (even only as needed), please bring them with you on the day of your procedure.   STAY ON YOUR BRILINTA PER DR GESSNER.  Cut back on your roughage 2-3 days before your procedure.   We are giving you a handout to read today on the gastroparesis diet, follow step #3.    Normal BMI (Body Mass Index- based on height and weight) is between 19 and 25. Your BMI today is Body mass index is 26.88 kg/m. Marland Kitchen Please consider follow up  regarding your BMI with your Primary Care Provider.   Your provider has requested that you go to the basement level for lab work before leaving today. Press "B" on the elevator. The lab is located at the first door on the left as you exit the elevator.   I appreciate the opportunity to care for you. Silvano Rusk, MD, [facility]

## 2018-07-28 LAB — TISSUE TRANSGLUTAMINASE, IGA: (tTG) Ab, IgA: 1 U/mL

## 2018-07-28 NOTE — Progress Notes (Signed)
My Chart No sprue

## 2018-07-30 ENCOUNTER — Other Ambulatory Visit: Payer: Self-pay

## 2018-07-30 ENCOUNTER — Ambulatory Visit: Payer: BLUE CROSS/BLUE SHIELD | Admitting: Endocrinology

## 2018-07-30 ENCOUNTER — Encounter: Payer: Self-pay | Admitting: Endocrinology

## 2018-07-30 VITALS — BP 124/62 | HR 91 | Ht 65.5 in | Wt 164.2 lb

## 2018-07-30 DIAGNOSIS — E1143 Type 2 diabetes mellitus with diabetic autonomic (poly)neuropathy: Secondary | ICD-10-CM

## 2018-07-30 LAB — POCT GLYCOSYLATED HEMOGLOBIN (HGB A1C): Hemoglobin A1C: 6.6 % — AB (ref 4.0–5.6)

## 2018-07-30 MED ORDER — REPAGLINIDE 1 MG PO TABS: 1.0000 mg | ORAL_TABLET | Freq: Three times a day (TID) | ORAL | 11 refills | Status: DC

## 2018-07-30 MED ORDER — METFORMIN HCL ER 500 MG PO TB24: 500.0000 mg | ORAL_TABLET | Freq: Every day | ORAL | 3 refills | Status: DC

## 2018-07-30 NOTE — Patient Instructions (Addendum)
check your blood sugar once a day.  vary the time of day when you check, between before the 3 meals, and at bedtime.  also check if you have symptoms of your blood sugar being too high or too low.  please keep a record of the readings and bring it to your next appointment here.  You can write it on any piece of paper.  please call us sooner if your blood sugar goes below 70, or if you have a lot of readings over 200.   Please stop taking the victoza, and:  I have sent a prescription to your pharmacy, to increase the repaglinide.  Please continue the same other diabetes medications.  Please come back for a follow-up appointment in 2 months.

## 2018-07-30 NOTE — Progress Notes (Signed)
Subjective:    Patient ID: [name] [name], female    DOB: 1955/06/18, 63 y.o.   MRN: [id number]  HPI Pt returns for f/u of diabetes mellitus: DM type: 2 Dx'ed: 5974 Complications: gastroparesis and renal insuff Therapy: 4 oral meds and victoza.  GDM: never DKA: never.  Severe hypoglycemia: never.  Pancreatitis: never.  Other: she has never been on insulin; diarrhea and renal insuff limits metformin dosage; she did not tolerate parlodel (vomiting); edema limits pioglitazone dosage.   Interval history: She does not miss med doses.  intermitt n/v persists.   Past Medical History:  Diagnosis Date  . Asthma   . Chronic pain disorder    neck and shoulder  . CTS (carpal tunnel syndrome) 01/27/2013   right  . Diabetes mellitus type II   . Diabetic gastroparesis ([place])   . Esophageal reflux 08/25/2013   [facility] [place], Dr Barth Kirks  . FATTY LIVER DISEASE 04/27/2007   Qualifier: Diagnosis of  By: Danelle Earthly CMA, Darlene    . Fundic gland polyps of stomach, benign   . HTN (hypertension)   . Hyperlipidemia, mixed 05/04/2007   Qualifier: Diagnosis of  By: Wynona Luna crestor caused myalgias even at low dose Livalo caused myalgias Lipitor  Mother with severe reaction myalgias Simvastatin, Welchol   . Hypertension 08/24/2010  . Hypokalemia 05/25/2013   Improved stopping HCTZ. Was noted to have an elevated glucose when K was low. Recheck renal next week after starting Maxzide  . Hypomagnesemia 08/10/2014  . Hypothyroidism   . Laryngitis 08/25/2017  . Leg cramps 04/15/2016  . Nausea without vomiting 08/08/2014  . NONSPEC ELEVATION OF LEVELS OF TRANSAMINASE/LDH 05/01/2007   Qualifier: Diagnosis of  By: Garner Gavel    . Osteoarthritis    chronic, right knee (11/04/2017)  . OVARIAN CYST 07/11/2009   Qualifier: Diagnosis of  By: Wynona Luna   . Ovarian cyst   . Plantar fasciitis   . Rosacea 10/22/2016  . Urine incontinence     Past Surgical History:  Procedure Laterality Date  .  AUGMENTATION MAMMAPLASTY Bilateral 2002  . BLADDER SUSPENSION  1981  . COLONOSCOPY    . CORONARY STENT INTERVENTION N/A 11/04/2017   Procedure: CORONARY STENT INTERVENTION;  Surgeon: Nelva Bush, MD;  Location: [place] [facility];  Service: Cardiovascular;  Laterality: N/A;  . ESOPHAGOGASTRODUODENOSCOPY    . INTRAVASCULAR PRESSURE WIRE/FFR STUDY N/A 11/04/2017   Procedure: INTRAVASCULAR PRESSURE WIRE/FFR STUDY;  Surgeon: Nelva Bush, MD;  Location: [place] [facility];  Service: Cardiovascular;  Laterality: N/A;  . INTRAVASCULAR ULTRASOUND/IVUS N/A 11/04/2017   Procedure: Intravascular Ultrasound/IVUS;  Surgeon: Nelva Bush, MD;  Location: [place] [facility];  Service: Cardiovascular;  Laterality: N/A;  . KNEE ARTHROSCOPY  1998, [id number]  . LAPAROSCOPIC CHOLECYSTECTOMY  2007  . LEFT HEART CATH AND CORONARY ANGIOGRAPHY N/A 11/04/2017   Procedure: LEFT HEART CATH AND CORONARY ANGIOGRAPHY;  Surgeon: Nelva Bush, MD;  Location: [place] [facility];  Service: Cardiovascular;  Laterality: N/A;  . LEFT HEART CATHETERIZATION WITH CORONARY ANGIOGRAM N/A 11/21/2011   Procedure: LEFT HEART CATHETERIZATION WITH CORONARY ANGIOGRAM;  Surgeon: Josue Hector, MD;  Location: [facility] CATH LAB;  Service: Cardiovascular;  Laterality: N/A;  . TONSILLECTOMY  1975  . VAGINAL HYSTERECTOMY  1983   "still have my ovaries"    Social History   Socioeconomic History  . Marital status: Married    Spouse name: Not on file  . Number of children: 1  . Years  of education: Not on file  . Highest education level: Not on file  Occupational History  . Occupation: IT TECH, admin. assistant    Employer: [employer]  Social Needs  . Financial resource strain: Not on file  . Food insecurity:    Worry: Not on file    Inability: Not on file  . Transportation needs:    Medical: Not on file    Non-medical: Not on file  Tobacco Use  . Smoking status: Never Smoker  . Smokeless tobacco: Never Used  Substance and  Sexual Activity  . Alcohol use: Not Currently  . Drug use: Never  . Sexual activity: Not Currently    Partners: Male  Lifestyle  . Physical activity:    Days per week: Not on file    Minutes per session: Not on file  . Stress: Not on file  Relationships  . Social connections:    Talks on phone: Not on file    Gets together: Not on file    Attends religious service: Not on file    Active member of club or organization: Not on file    Attends meetings of clubs or organizations: Not on file    Relationship status: Not on file  . Intimate partner violence:    Fear of current or ex partner: Not on file    Emotionally abused: Not on file    Physically abused: Not on file    Forced sexual activity: Not on file  Other Topics Concern  . Not on file  Social History Narrative   Marital status/children/pets: Married, 1 child.    Education/employment: HS   Safety:      -smoke alarm in the home:Yes     - wears seatbelt: Yes     - Feels safe in their relationships: Yes    Current Outpatient Medications on File Prior to Visit  Medication Sig Dispense Refill  . ACCU-CHEK FASTCLIX LANCETS MISC CHECK BLOOD SUGAR ONCE     DAILY 102 each 3  . aspirin 81 MG tablet Take 81 mg by mouth daily.     . Calcium Carbonate (CALTRATE 600 PO) Take 600 mg by mouth 2 (two) times daily.     . famotidine (PEPCID) 20 MG tablet Take 1 tablet (20 mg total) by mouth at bedtime. 90 tablet 3  . FARXIGA 10 MG TABS tablet TAKE 1 TABLET EVERY DAY 90 tablet 3  . fenofibrate 160 MG tablet TAKE 1 TABLET BY MOUTH EVERY DAY 90 tablet 0  . Glucosamine-Chondroitin (OSTEO BI-FLEX REGULAR STRENGTH) 250-200 MG TABS Take 1 tablet by mouth 2 (two) times daily.     Marland Kitchen glucose blood (ACCU-CHEK SMARTVIEW) test strip Check blood sugars once daily 100 each 12  . Insulin Pen Needle (B-D ULTRAFINE III SHORT PEN) 31G X 8 MM MISC INJECT ONCE DAILY 100 each 3  . Iron Polysacch Cmplx-B12-FA (POLY-IRON 150 FORTE) 150-0.025-1 MG CAPS Take 1  capsule by mouth daily. 90 each 0  . Krill Oil 350 MG CAPS Take 350 mg by mouth daily.     Marland Kitchen levothyroxine (SYNTHROID, LEVOTHROID) 88 MCG tablet Take 88 mcg by mouth See admin instructions. Take 1 tablet daily and 2 tablets on Saturday    . Magnesium 250 MG TABS Take 250-500 mg by mouth See admin instructions. Take 250mg  by mouth Sunday, Tuesday, Thursday and Saturday and 500mg  Monday, Wednesday and Friday    . metoCLOPramide (REGLAN) 5 MG tablet Take 1 tablet (5 mg total) by mouth 4 (  four) times daily. 120 tablet 1  . metoprolol tartrate (LOPRESSOR) 25 MG tablet Take 1 tablet (25 mg total) by mouth 2 (two) times daily. 180 tablet 3  . nitroGLYCERIN (NITROSTAT) 0.4 MG SL tablet Place 1 tablet (0.4 mg total) under the tongue every 5 (five) minutes as needed for chest pain. 50 tablet 3  . ondansetron (ZOFRAN) 4 MG tablet Take 1 tablet (4 mg total) by mouth every 8 (eight) hours as needed for nausea or vomiting. 20 tablet 0  . pantoprazole (PROTONIX) 40 MG tablet Take 1 tablet (40 mg total) by mouth 2 (two) times daily. 180 tablet 1  . pioglitazone (ACTOS) 30 MG tablet TAKE 1 TABLET BY MOUTH EVERY DAY 90 tablet 3  . POTASSIUM CITRATE PO Take 99 mg by mouth daily.     . Probiotic Product (PROBIOTIC DAILY PO) Take 99 mg by mouth daily.     . ticagrelor (BRILINTA) 90 MG TABS tablet Take 1 tablet (90 mg total) by mouth 2 (two) times daily. 180 tablet 3  . triamterene-hydrochlorothiazide (MAXZIDE-25) 37.5-25 MG tablet Take 0.5 tablets by mouth daily. 45 tablet 1  . diltiazem (CARDIZEM CD) 240 MG 24 hr capsule Take 1 capsule (240 mg total) by mouth daily. 90 capsule 3   No current facility-administered medications on file prior to visit.     Allergies  Allergen Reactions  . Epinephrine Other (See Comments)    Pass out   . Repatha [Evolocumab] Nausea And Vomiting  . Statins Other (See Comments)    Myalgias: Simvastatin also caused back ache  . Welchol [Colesevelam Hcl]     myalgia  . Amoxicillin  Itching and Rash    Has patient had a PCN reaction causing immediate rash, facial/tongue/throat swelling, SOB or lightheadedness with hypotension: No Has patient had a PCN reaction causing severe rash involving mucus membranes or skin necrosis: No Has patient had a PCN reaction that required hospitalization: No Has patient had a PCN reaction occurring within the last 10 years: Yes If all of the above answers are "NO", then may proceed with Cephalosporin use.     Family History  Problem Relation Age of Onset  . Alzheimer's disease Mother   . Diabetes type II Mother   . Hiatal hernia Mother   . Diabetes Mother   . Emphysema Father   . COPD Father   . Depression Sister        suicide  . Diabetes Sister   . Diabetes Daughter   . Colon cancer Neg Hx   . Stomach cancer Neg Hx     BP 124/62 (BP Location: Left Arm, Patient Position: Sitting, Cuff Size: Normal)   Pulse 91   Ht 5' 5.5" (1.664 m)   Wt 164 lb 3.2 oz (74.5 kg)   SpO2 96%   BMI 26.91 kg/m    Review of Systems diarrrhea    Objective:   Physical Exam VITAL SIGNS:  See vs page GENERAL: no distress Pulses: dorsalis pedis intact bilat.   MSK: no deformity of the feet.   CV: trace bilat leg edema.   Skin:  no ulcer on the feet.  normal color and temp on the feet.  Neuro: sensation is intact to touch on the feet.     Lab Results  Component Value Date   CREATININE 0.98 06/26/2018   BUN 18 06/26/2018   NA 139 06/26/2018   K 3.4 (L) 06/26/2018   CL 101 06/26/2018   CO2 28 06/26/2018   Lab  Results  Component Value Date   HGBA1C 6.6 (A) 07/30/2018       Assessment & Plan:  Nausea: prob caused or exac by victoza type 2 DM: worse.   Patient Instructions  check your blood sugar once a day.  vary the time of day when you check, between before the 3 meals, and at bedtime.  also check if you have symptoms of your blood sugar being too high or too low.  please keep a record of the readings and bring it to your next  appointment here.  You can write it on any piece of paper.  please call us sooner if your blood sugar goes below 70, or if you have a lot of readings over 200.   Please stop taking the victoza, and:  I have sent a prescription to your pharmacy, to increase the repaglinide.  Please continue the same other diabetes medications.  Please come back for a follow-up appointment in 2 months.

## 2018-08-06 ENCOUNTER — Other Ambulatory Visit: Payer: Self-pay

## 2018-08-06 ENCOUNTER — Ambulatory Visit (AMBULATORY_SURGERY_CENTER): Payer: BLUE CROSS/BLUE SHIELD | Admitting: Internal Medicine

## 2018-08-06 ENCOUNTER — Encounter: Payer: Self-pay | Admitting: Internal Medicine

## 2018-08-06 VITALS — BP 114/52 | HR 65 | Temp 98.0°F | Resp 16 | Ht 65.5 in | Wt 164.0 lb

## 2018-08-06 DIAGNOSIS — K317 Polyp of stomach and duodenum: Secondary | ICD-10-CM | POA: Diagnosis not present

## 2018-08-06 DIAGNOSIS — K2970 Gastritis, unspecified, without bleeding: Secondary | ICD-10-CM | POA: Diagnosis not present

## 2018-08-06 DIAGNOSIS — K3184 Gastroparesis: Principal | ICD-10-CM

## 2018-08-06 DIAGNOSIS — K2950 Unspecified chronic gastritis without bleeding: Secondary | ICD-10-CM | POA: Diagnosis not present

## 2018-08-06 DIAGNOSIS — E1143 Type 2 diabetes mellitus with diabetic autonomic (poly)neuropathy: Secondary | ICD-10-CM

## 2018-08-06 MED ORDER — SODIUM CHLORIDE 0.9 % IV SOLN: 500.0000 mL | Freq: Once | INTRAVENOUS | Status: DC

## 2018-08-06 NOTE — Patient Instructions (Addendum)
I found some stomach polyps and inflammation and took biopsies. Doubt any of this related to the vomiting but possibly to the anemia.  I see Victoza was stopped - hopefully that has helped with the vomiting (gone I hope).  Try to come off the metaclopramide by reducing the number of times a day you take it and hopefully stop completely. No one way so choose a dose to stop and drop the others.  When you are able to hold ticagrelor you will need to do a colonoscopy.  I appreciate the opportunity to care for you. Gatha Mayer, MD, FACG  YOU HAD AN ENDOSCOPIC PROCEDURE TODAY AT [facility]:   Refer to the procedure report that was given to you for any specific questions about what was found during the examination.  If the procedure report does not answer your questions, please call your gastroenterologist to clarify.  If you requested that your care partner not be given the details of your procedure findings, then the procedure report has been included in a sealed envelope for you to review at your convenience later.  YOU SHOULD EXPECT: Some feelings of bloating in the abdomen. Passage of more gas than usual.  Walking can help get rid of the air that was put into your GI tract during the procedure and reduce the bloating. If you had a lower endoscopy (such as a colonoscopy or flexible sigmoidoscopy) you may notice spotting of blood in your stool or on the toilet paper. If you underwent a bowel prep for your procedure, you may not have a normal bowel movement for a few days.  Please Note:  You might notice some irritation and congestion in your nose or some drainage.  This is from the oxygen used during your procedure.  There is no need for concern and it should clear up in a day or so.  SYMPTOMS TO REPORT IMMEDIATELY:    Following upper endoscopy (EGD)  Vomiting of blood or coffee ground material  New chest pain or pain under the shoulder blades  Painful or  persistently difficult swallowing  New shortness of breath  Fever of 100F or higher  Black, tarry-looking stools  For urgent or emergent issues, a gastroenterologist can be reached at any hour by calling [phone].   DIET:  We do recommend a small meal at first, but then you may proceed to your regular diet.  Drink plenty of fluids but you should avoid alcoholic beverages for 24 hours.  ACTIVITY:  You should plan to take it easy for the rest of today and you should NOT DRIVE or use heavy machinery until tomorrow (because of the sedation medicines used during the test).    FOLLOW UP: Our staff will call the number listed on your records the next business day following your procedure to check on you and address any questions or concerns that you may have regarding the information given to you following your procedure. If we do not reach you, we will leave a message.  However, if you are feeling well and you are not experiencing any problems, there is no need to return our call.  We will assume that you have returned to your regular daily activities without incident.  If any biopsies were taken you will be contacted by phone or by letter within the next 1-3 weeks.  Please call us at [phone] if you have not heard about the biopsies in 3 weeks.    SIGNATURES/CONFIDENTIALITY: You  and/or your care partner have signed paperwork which will be entered into your electronic medical record.  These signatures attest to the fact that that the information above on your After Visit Summary has been reviewed and is understood.  Full responsibility of the confidentiality of this discharge information lies with you and/or your care-partner.

## 2018-08-06 NOTE — Progress Notes (Signed)
Called to room to assist during endoscopic procedure.  Patient ID and intended procedure confirmed with present staff. Received instructions for my participation in the procedure from the performing physician.  

## 2018-08-06 NOTE — Progress Notes (Signed)
A and O x3. Report to RN. Tolerated MAC anesthesia well.Teeth unchanged after procedure.

## 2018-08-06 NOTE — Op Note (Addendum)
[facility] Patient Name: [name] [name] Procedure Date: 08/06/2018 10:01 AM MRN: [id number] Endoscopist: Gatha Mayer , MD Age: 63 Referring MD:  Date of Birth: Jan 10, 1956 Gender: Female Account #: [id number] Procedure:                Upper GI endoscopy Indications:              Iron deficiency anemia, Gastroparesis Medicines:                Propofol per Anesthesia, Monitored Anesthesia Care Procedure:                Pre-Anesthesia Assessment:                           - Prior to the procedure, a History and Physical                            was performed, and patient medications and                            allergies were reviewed. The patient's tolerance of                            previous anesthesia was also reviewed. The risks                            and benefits of the procedure and the sedation                            options and risks were discussed with the patient.                            All questions were answered, and informed consent                            was obtained. Prior Anticoagulants: The patient                            last took antiplatelet medication on the day of the                            procedure. ASA Grade Assessment: II - A patient                            with mild systemic disease. After reviewing the                            risks and benefits, the patient was deemed in                            satisfactory condition to undergo the procedure.                           After obtaining informed consent, the endoscope was  passed under direct vision. Throughout the                            procedure, the patient's blood pressure, pulse, and                            oxygen saturations were monitored continuously. The                            Endoscope was introduced through the mouth, and                            advanced to the second part of duodenum. The upper            GI endoscopy was accomplished without difficulty.                            The patient tolerated the procedure well. Scope In: Scope Out: Findings:                 A single 5 mm sessile polyp with no bleeding and                            stigmata of recent bleeding was found in the                            cardia. Biopsies were taken with a cold forceps for                            histology. Verification of patient identification                            for the specimen was done. Estimated blood loss was                            minimal.                           Multiple subcm sessile polyps with stigmata of                            recent bleeding were found in the gastric fundus                            and in the gastric body. Biopsies were taken with a                            cold forceps for histology. Verification of patient                            identification for the specimen was done. Estimated                            blood loss was minimal.  Striped moderately erythematous mucosa without                            bleeding was found in the gastric antrum. Biopsies                            were taken with a cold forceps for histology.                            Verification of patient identification for the                            specimen was done. Estimated blood loss was minimal.                           The exam was otherwise without abnormality.                           The cardia and gastric fundus were otherwise normal                            on retroflexion. Complications:            No immediate complications. Estimated Blood Loss:     Estimated blood loss was minimal. Impression:               - A single gastric polyp. Biopsied. Cardia - friable                           - Multiple gastric polyps. Biopsied. A few were                            friable                           - Erythematous  mucosa in the antrum. Biopsied.                           - The examination was otherwise normal. Recommendation:           - Patient has a contact number available for                            emergencies. The signs and symptoms of potential                            delayed complications were discussed with the                            patient. Return to normal activities tomorrow.                            Written discharge instructions were provided to the                            patient.                           -  Resume previous diet.                           - Continue present medications.                           - Continue anticoagulant medication at prior dose.                           - Await pathology results.                           Will need a colonoscopy when able to hold                            ticagrelor.                           Hopefully the dc of Victoza will help w/ vomiting.                            She will try to stop metaclopramide Gatha Mayer, MD 08/06/2018 10:45:08 AM This report has been signed electronically.

## 2018-08-06 NOTE — Progress Notes (Signed)
Computers went down before patients procedure started. Admission and other documentation was entered at a later time by recovery RN.

## 2018-08-07 ENCOUNTER — Telehealth: Payer: Self-pay

## 2018-08-07 ENCOUNTER — Other Ambulatory Visit: Payer: Self-pay

## 2018-08-07 ENCOUNTER — Encounter: Payer: Self-pay | Admitting: Hematology & Oncology

## 2018-08-07 ENCOUNTER — Inpatient Hospital Stay: Payer: BLUE CROSS/BLUE SHIELD | Attending: Hematology & Oncology | Admitting: Hematology & Oncology

## 2018-08-07 ENCOUNTER — Inpatient Hospital Stay: Payer: BLUE CROSS/BLUE SHIELD

## 2018-08-07 DIAGNOSIS — D508 Other iron deficiency anemias: Secondary | ICD-10-CM

## 2018-08-07 DIAGNOSIS — K909 Intestinal malabsorption, unspecified: Secondary | ICD-10-CM | POA: Insufficient documentation

## 2018-08-07 DIAGNOSIS — D509 Iron deficiency anemia, unspecified: Secondary | ICD-10-CM

## 2018-08-07 DIAGNOSIS — D510 Vitamin B12 deficiency anemia due to intrinsic factor deficiency: Secondary | ICD-10-CM

## 2018-08-07 HISTORY — DX: Intestinal malabsorption, unspecified: K90.9

## 2018-08-07 HISTORY — DX: Iron deficiency anemia, unspecified: D50.9

## 2018-08-07 LAB — CMP (CANCER CENTER ONLY)
ALT: 15 U/L (ref 0–44)
AST: 16 U/L (ref 15–41)
Albumin: 4.3 g/dL (ref 3.5–5.0)
Alkaline Phosphatase: 94 U/L (ref 38–126)
Anion gap: 12 (ref 5–15)
BUN: 20 mg/dL (ref 8–23)
CO2: 23 mmol/L (ref 22–32)
Calcium: 9.6 mg/dL (ref 8.9–10.3)
Chloride: 102 mmol/L (ref 98–111)
Creatinine: 1.04 mg/dL — ABNORMAL HIGH (ref 0.44–1.00)
GFR, Est AFR Am: >60 mL/min (ref >60)
GFR, Estimated: 57 mL/min — ABNORMAL LOW (ref >60)
Glucose, Bld: 338 mg/dL — ABNORMAL HIGH (ref 70–99)
Potassium: 4.3 mmol/L (ref 3.5–5.1)
SODIUM: 137 mmol/L (ref 135–145)
Total Bilirubin: 0.4 mg/dL (ref 0.3–1.2)
Total Protein: 7.2 g/dL (ref 6.5–8.1)

## 2018-08-07 LAB — CBC WITH DIFFERENTIAL (CANCER CENTER ONLY)
Abs Immature Granulocytes: 0.04 10*3/uL (ref 0.00–0.07)
Basophils Absolute: 0.1 10*3/uL (ref 0.0–0.1)
Basophils Relative: 1 %
Eosinophils Absolute: 0.2 10*3/uL (ref 0.0–0.5)
Eosinophils Relative: 3 %
HEMATOCRIT: 35.6 % — AB (ref 36.0–46.0)
Hemoglobin: 10.5 g/dL — ABNORMAL LOW (ref 12.0–15.0)
Immature Granulocytes: 1 %
Lymphocytes Relative: 16 %
Lymphs Abs: 1.3 10*3/uL (ref 0.7–4.0)
MCH: 22.9 pg — ABNORMAL LOW (ref 26.0–34.0)
MCHC: 29.5 g/dL — ABNORMAL LOW (ref 30.0–36.0)
MCV: 77.6 fL — ABNORMAL LOW (ref 80.0–100.0)
Monocytes Absolute: 0.6 10*3/uL (ref 0.1–1.0)
Monocytes Relative: 7 %
NEUTROS PCT: 72 %
Neutro Abs: 6 10*3/uL (ref 1.7–7.7)
Platelet Count: 329 10*3/uL (ref 150–400)
RBC: 4.59 MIL/uL (ref 3.87–5.11)
RDW: 16.6 % — ABNORMAL HIGH (ref 11.5–15.5)
WBC Count: 8.2 10*3/uL (ref 4.0–10.5)
nRBC: 0 % (ref 0.0–0.2)

## 2018-08-07 LAB — SAMPLE TO BLOOD BANK

## 2018-08-07 LAB — RETICULOCYTES
Immature Retic Fract: 25.2 % — ABNORMAL HIGH (ref 2.3–15.9)
RBC.: 4.59 MIL/uL (ref 3.87–5.11)
Retic Count, Absolute: 86.3 10*3/uL (ref 19.0–186.0)
Retic Ct Pct: 1.9 % (ref 0.4–3.1)

## 2018-08-07 LAB — IRON AND TIBC
IRON: 32 ug/dL — AB (ref 41–142)
Saturation Ratios: 7 % — ABNORMAL LOW (ref 21–57)
TIBC: 456 ug/dL — ABNORMAL HIGH (ref 236–444)
UIBC: 424 ug/dL — ABNORMAL HIGH (ref 120–384)

## 2018-08-07 LAB — VITAMIN B12: Vitamin B-12: 263 pg/mL (ref 180–914)

## 2018-08-07 LAB — FERRITIN: Ferritin: 6 ng/mL — ABNORMAL LOW (ref 11–307)

## 2018-08-07 NOTE — Progress Notes (Signed)
Hematology and Oncology Follow Up Visit  [name] [name] [id number] 28-Feb-1956 63 y.o. 08/07/2018   Principle Diagnosis:   Iron deficiency anemia secondary to iron malabsorption  Current Therapy:    IV iron to be given on 3/19 and 3/26     Interim History:  [name] [name] is back for follow-up.  Unfortunately, does not look like oral iron is helping her.  She is still quite anemic.  We last saw her on January 31, her iron levels were quite low.  Her ferritin was only 4 with an iron saturation of 6%.  Again she is not able to absorb oral iron.  She is on Pepcid and Protonix.  I think this really is decreasing her stomach acid output.  She has had no problems with cough or shortness of breath.  She recently had an upper endoscopy.  She says and biopsies were taken.  She is not sure what the results are.  There is been no bleeding.  She has had no change in bowel or bladder habits.  She still feels tired.  She does not have a lot of energy.  Overall, her performance status is ECOG 1.  Medications:  Current Outpatient Medications:  .  ACCU-CHEK FASTCLIX LANCETS MISC, CHECK BLOOD SUGAR ONCE     DAILY, Disp: 102 each, Rfl: 3 .  aspirin 81 MG tablet, Take 81 mg by mouth daily. , Disp: , Rfl:  .  Calcium Carbonate (CALTRATE 600 PO), Take 600 mg by mouth 2 (two) times daily. , Disp: , Rfl:  .  diltiazem (CARDIZEM CD) 240 MG 24 hr capsule, Take 1 capsule (240 mg total) by mouth daily., Disp: 90 capsule, Rfl: 3 .  famotidine (PEPCID) 20 MG tablet, Take 1 tablet (20 mg total) by mouth at bedtime., Disp: 90 tablet, Rfl: 3 .  FARXIGA 10 MG TABS tablet, TAKE 1 TABLET EVERY DAY, Disp: 90 tablet, Rfl: 3 .  fenofibrate 160 MG tablet, TAKE 1 TABLET BY MOUTH EVERY DAY, Disp: 90 tablet, Rfl: 0 .  Glucosamine-Chondroitin (OSTEO BI-FLEX REGULAR STRENGTH) 250-200 MG TABS, Take 1 tablet by mouth 2 (two) times daily. , Disp: , Rfl:  .  glucose blood (ACCU-CHEK SMARTVIEW) test strip, Check blood sugars  once daily, Disp: 100 each, Rfl: 12 .  Insulin Pen Needle (B-D ULTRAFINE III SHORT PEN) 31G X 8 MM MISC, INJECT ONCE DAILY, Disp: 100 each, Rfl: 3 .  Iron Polysacch Cmplx-B12-FA (POLY-IRON 150 FORTE) 150-0.025-1 MG CAPS, Take 1 capsule by mouth daily., Disp: 90 each, Rfl: 0 .  Krill Oil 350 MG CAPS, Take 350 mg by mouth daily. , Disp: , Rfl:  .  levothyroxine (SYNTHROID, LEVOTHROID) 88 MCG tablet, Take 88 mcg by mouth See admin instructions. Take 1 tablet daily and 2 tablets on Saturday, Disp: , Rfl:  .  Magnesium 250 MG TABS, Take 250-500 mg by mouth See admin instructions. Take 250mg  by mouth Sunday, Tuesday, Thursday and Saturday and 500mg  Monday, Wednesday and Friday, Disp: , Rfl:  .  metFORMIN (GLUCOPHAGE-XR) 500 MG 24 hr tablet, Take 1 tablet (500 mg total) by mouth daily., Disp: 90 tablet, Rfl: 3 .  metoprolol tartrate (LOPRESSOR) 25 MG tablet, Take 1 tablet (25 mg total) by mouth 2 (two) times daily., Disp: 180 tablet, Rfl: 3 .  nitroGLYCERIN (NITROSTAT) 0.4 MG SL tablet, Place 1 tablet (0.4 mg total) under the tongue every 5 (five) minutes as needed for chest pain., Disp: 50 tablet, Rfl: 3 .  ondansetron (ZOFRAN) 4 MG  tablet, Take 1 tablet (4 mg total) by mouth every 8 (eight) hours as needed for nausea or vomiting., Disp: 20 tablet, Rfl: 0 .  pantoprazole (PROTONIX) 40 MG tablet, Take 1 tablet (40 mg total) by mouth 2 (two) times daily., Disp: 180 tablet, Rfl: 1 .  pioglitazone (ACTOS) 30 MG tablet, TAKE 1 TABLET BY MOUTH EVERY DAY, Disp: 90 tablet, Rfl: 3 .  POTASSIUM CITRATE PO, Take 99 mg by mouth daily. , Disp: , Rfl:  .  Probiotic Product (PROBIOTIC DAILY PO), Take 99 mg by mouth daily. , Disp: , Rfl:  .  repaglinide (PRANDIN) 1 MG tablet, Take 1 tablet (1 mg total) by mouth 3 (three) times daily before meals., Disp: 90 tablet, Rfl: 11 .  ticagrelor (BRILINTA) 90 MG TABS tablet, Take 1 tablet (90 mg total) by mouth 2 (two) times daily., Disp: 180 tablet, Rfl: 3 .   triamterene-hydrochlorothiazide (MAXZIDE-25) 37.5-25 MG tablet, Take 0.5 tablets by mouth daily., Disp: 45 tablet, Rfl: 1  Current Facility-Administered Medications:  .  0.9 %  sodium chloride infusion, 500 mL, Intravenous, Once, Gatha Mayer, MD  Allergies:  Allergies  Allergen Reactions  . Epinephrine Other (See Comments)    Pass out   . Repatha [Evolocumab] Nausea And Vomiting  . Statins Other (See Comments)    Myalgias: Simvastatin also caused back ache  . Welchol [Colesevelam Hcl]     myalgia  . Amoxicillin Itching and Rash    Has patient had a PCN reaction causing immediate rash, facial/tongue/throat swelling, SOB or lightheadedness with hypotension: No Has patient had a PCN reaction causing severe rash involving mucus membranes or skin necrosis: No Has patient had a PCN reaction that required hospitalization: No Has patient had a PCN reaction occurring within the last 10 years: Yes If all of the above answers are "NO", then may proceed with Cephalosporin use.     Past Medical History, Surgical history, Social history, and Family History were reviewed and updated.  Review of Systems: Review of Systems  Constitutional: Positive for fatigue.  HENT:  Negative.   Eyes: Negative.   Respiratory: Negative.  Negative for chest tightness.   Cardiovascular: Negative.   Gastrointestinal: Negative.   Endocrine: Negative.   Genitourinary: Negative.    Musculoskeletal: Negative.   Skin: Negative.   Neurological: Negative.   Hematological: Negative.   Psychiatric/Behavioral: Negative.     Physical Exam:  weight is 164 lb (74.4 kg). Her oral temperature is 98.2 F (36.8 C). Her blood pressure is 114/54 (abnormal) and her pulse is 68. Her respiration is 16 and oxygen saturation is 100%.   Wt Readings from Last 3 Encounters:  08/07/18 164 lb (74.4 kg)  08/06/18 164 lb (74.4 kg)  07/30/18 164 lb 3.2 oz (74.5 kg)    Physical Exam Vitals signs reviewed.  HENT:     Head:  Normocephalic and atraumatic.  Eyes:     Pupils: Pupils are equal, round, and reactive to light.  Neck:     Musculoskeletal: Normal range of motion.  Cardiovascular:     Rate and Rhythm: Normal rate and regular rhythm.     Heart sounds: Normal heart sounds.  Pulmonary:     Effort: Pulmonary effort is normal.     Breath sounds: Normal breath sounds.  Abdominal:     General: Bowel sounds are normal.     Palpations: Abdomen is soft.  Musculoskeletal: Normal range of motion.        General: No tenderness or deformity.  Lymphadenopathy:     Cervical: No cervical adenopathy.  Skin:    General: Skin is warm and dry.     Findings: No erythema or rash.  Neurological:     Mental Status: She is alert and oriented to person, place, and time.  Psychiatric:        Behavior: Behavior normal.        Thought Content: Thought content normal.        Judgment: Judgment normal.      Lab Results  Component Value Date   WBC 8.2 08/07/2018   HGB 10.5 (L) 08/07/2018   HCT 35.6 (L) 08/07/2018   MCV 77.6 (L) 08/07/2018   PLT 329 08/07/2018     Chemistry      Component Value Date/Time   NA 137 08/07/2018 0751   NA 140 11/03/2017 1046   K 4.3 08/07/2018 0751   CL 102 08/07/2018 0751   CO2 23 08/07/2018 0751   BUN 20 08/07/2018 0751   BUN 18 11/03/2017 1046   CREATININE 1.04 (H) 08/07/2018 0751   CREATININE 0.72 06/20/2011 0925      Component Value Date/Time   CALCIUM 9.6 08/07/2018 0751   ALKPHOS 94 08/07/2018 0751   AST 16 08/07/2018 0751   ALT 15 08/07/2018 0751   BILITOT 0.4 08/07/2018 0751        Impression and Plan: Ms. Furnari is a 63 year old white female.  She has iron deficiency anemia secondary to iron malabsorption.  For right now, I think we are going to have to give her a couple doses of IV iron.  I will try to use Injectafer.  I think this would be reasonable.  I talked her about this.  I think that this will work.  We will give her 2 doses.  We will start next  week.  I will plan to see her back in 6 weeks.   Volanda Napoleon, MD 3/13/20208:33 AM

## 2018-08-07 NOTE — Telephone Encounter (Signed)
  Follow up Call-  Call back number 08/06/2018  Post procedure Call Back phone  # [phone]  Permission to leave phone message Yes  Some recent data might be hidden     Patient questions:  Do you have a fever, pain , or abdominal swelling? No. Pain Score  0 *  Have you tolerated food without any problems? Yes.    Have you been able to return to your normal activities? Yes.    Do you have any questions about your discharge instructions: Diet   No. Medications  No. Follow up visit  No.  Do you have questions or concerns about your Care? No.  Actions: * If pain score is 4 or above: No action needed, pain <4.

## 2018-08-13 ENCOUNTER — Ambulatory Visit: Payer: BLUE CROSS/BLUE SHIELD

## 2018-08-14 ENCOUNTER — Encounter: Payer: Self-pay | Admitting: Internal Medicine

## 2018-08-14 NOTE — Progress Notes (Signed)
Polyps benign My chart letter [facility] colonoscopy recall June 2020 - that is when she can come off ticagrelor after stent and have colonoscopy to further evaluate iron def anemia

## 2018-08-20 ENCOUNTER — Inpatient Hospital Stay: Payer: BLUE CROSS/BLUE SHIELD

## 2018-08-20 ENCOUNTER — Other Ambulatory Visit: Payer: Self-pay

## 2018-08-20 VITALS — BP 105/57 | HR 67 | Temp 98.2°F | Resp 17

## 2018-08-20 DIAGNOSIS — D508 Other iron deficiency anemias: Secondary | ICD-10-CM | POA: Diagnosis not present

## 2018-08-20 DIAGNOSIS — K909 Intestinal malabsorption, unspecified: Secondary | ICD-10-CM

## 2018-08-20 MED ORDER — SODIUM CHLORIDE 0.9 % IV SOLN
750.0000 mg | Freq: Once | INTRAVENOUS | Status: AC
  Administered 2018-08-20: 750 mg via INTRAVENOUS
  Filled 2018-08-20: qty 15

## 2018-08-20 MED ORDER — SODIUM CHLORIDE 0.9 % IV SOLN
Freq: Once | INTRAVENOUS | Status: AC
  Administered 2018-08-20: 09:00:00 via INTRAVENOUS
  Filled 2018-08-20: qty 250

## 2018-08-20 NOTE — Patient Instructions (Signed)

## 2018-08-23 ENCOUNTER — Other Ambulatory Visit: Payer: Self-pay | Admitting: Endocrinology

## 2018-08-27 ENCOUNTER — Inpatient Hospital Stay: Payer: BLUE CROSS/BLUE SHIELD | Attending: Hematology & Oncology

## 2018-08-27 ENCOUNTER — Other Ambulatory Visit: Payer: Self-pay

## 2018-08-27 VITALS — BP 105/52 | HR 66 | Temp 98.1°F | Resp 18

## 2018-08-27 DIAGNOSIS — K909 Intestinal malabsorption, unspecified: Secondary | ICD-10-CM

## 2018-08-27 DIAGNOSIS — Z7982 Long term (current) use of aspirin: Secondary | ICD-10-CM | POA: Diagnosis not present

## 2018-08-27 DIAGNOSIS — E119 Type 2 diabetes mellitus without complications: Secondary | ICD-10-CM | POA: Insufficient documentation

## 2018-08-27 DIAGNOSIS — Z794 Long term (current) use of insulin: Secondary | ICD-10-CM | POA: Insufficient documentation

## 2018-08-27 DIAGNOSIS — D508 Other iron deficiency anemias: Secondary | ICD-10-CM | POA: Diagnosis not present

## 2018-08-27 DIAGNOSIS — Z79899 Other long term (current) drug therapy: Secondary | ICD-10-CM | POA: Diagnosis not present

## 2018-08-27 MED ORDER — SODIUM CHLORIDE 0.9 % IV SOLN
750.0000 mg | Freq: Once | INTRAVENOUS | Status: AC
  Administered 2018-08-27: 750 mg via INTRAVENOUS
  Filled 2018-08-27: qty 15

## 2018-08-27 MED ORDER — SODIUM CHLORIDE 0.9 % IV SOLN
Freq: Once | INTRAVENOUS | Status: AC
  Administered 2018-08-27: 08:00:00 via INTRAVENOUS
  Filled 2018-08-27: qty 250

## 2018-08-27 NOTE — Patient Instructions (Signed)
Ferric carboxymaltose injection (Injectafer) What is this medicine? FERRIC CARBOXYMALTOSE (ferr-ik car-box-ee-mol-toes) is an iron complex. Iron is used to make healthy red blood cells, which carry oxygen and nutrients throughout the body. This medicine is used to treat anemia in people with chronic kidney disease or people who cannot take iron by mouth. This medicine may be used for other purposes; ask your health care provider or pharmacist if you have questions. COMMON BRAND NAME(S): Injectafer What should I tell my health care provider before I take this medicine? They need to know if you have any of these conditions: -high levels of iron in the blood -liver disease -an unusual or allergic reaction to iron, other medicines, foods, dyes, or preservatives -pregnant or trying to get pregnant -breast-feeding How should I use this medicine? This medicine is for infusion into a vein. It is given by a health care professional in a hospital or clinic setting. Talk to your pediatrician regarding the use of this medicine in children. Special care may be needed. Overdosage: If you think you have taken too much of this medicine contact a poison control center or emergency room at once. NOTE: This medicine is only for you. Do not share this medicine with others. What if I miss a dose? It is important not to miss your dose. Call your doctor or health care professional if you are unable to keep an appointment. What may interact with this medicine? Do not take this medicine with any of the following medications: -deferoxamine -dimercaprol -other iron products This list may not describe all possible interactions. Give your health care provider a list of all the medicines, herbs, non-prescription drugs, or dietary supplements you use. Also tell them if you smoke, drink alcohol, or use illegal drugs. Some items may interact with your medicine. What should I watch for while using this medicine? Visit your  doctor or health care professional regularly. Tell your doctor if your symptoms do not start to get better or if they get worse. You may need blood work done while you are taking this medicine. You may need to follow a special diet. Talk to your doctor. Foods that contain iron include: whole grains/cereals, dried fruits, beans, or peas, leafy green vegetables, and organ meats (liver, kidney). What side effects may I notice from receiving this medicine? Side effects that you should report to your doctor or health care professional as soon as possible: -allergic reactions like skin rash, itching or hives, swelling of the face, lips, or tongue -dizziness -facial flushing Side effects that usually do not require medical attention (report to your doctor or health care professional if they continue or are bothersome): -changes in taste -constipation -headache -nausea, vomiting -pain, redness, or irritation at site where injected This list may not describe all possible side effects. Call your doctor for medical advice about side effects. You may report side effects to FDA at 1-800-FDA-1088. Where should I keep my medicine? This drug is given in a hospital or clinic and will not be stored at home. NOTE: This sheet is a summary. It may not cover all possible information. If you have questions about this medicine, talk to your doctor, pharmacist, or health care provider.  2019 Elsevier/Gold Standard (2016-06-27 09:40:29)  

## 2018-09-12 ENCOUNTER — Other Ambulatory Visit: Payer: Self-pay | Admitting: Endocrinology

## 2018-09-18 ENCOUNTER — Inpatient Hospital Stay: Payer: BLUE CROSS/BLUE SHIELD

## 2018-09-18 ENCOUNTER — Inpatient Hospital Stay: Payer: BLUE CROSS/BLUE SHIELD | Admitting: Hematology & Oncology

## 2018-09-18 ENCOUNTER — Encounter: Payer: Self-pay | Admitting: Hematology & Oncology

## 2018-09-18 ENCOUNTER — Encounter: Payer: Self-pay | Admitting: *Deleted

## 2018-09-18 ENCOUNTER — Other Ambulatory Visit: Payer: Self-pay

## 2018-09-18 VITALS — BP 116/57 | HR 71 | Temp 98.2°F | Resp 16 | Wt 167.0 lb

## 2018-09-18 DIAGNOSIS — E119 Type 2 diabetes mellitus without complications: Secondary | ICD-10-CM | POA: Diagnosis not present

## 2018-09-18 DIAGNOSIS — Z794 Long term (current) use of insulin: Secondary | ICD-10-CM | POA: Diagnosis not present

## 2018-09-18 DIAGNOSIS — D508 Other iron deficiency anemias: Secondary | ICD-10-CM | POA: Diagnosis not present

## 2018-09-18 DIAGNOSIS — Z7982 Long term (current) use of aspirin: Secondary | ICD-10-CM

## 2018-09-18 DIAGNOSIS — K909 Intestinal malabsorption, unspecified: Secondary | ICD-10-CM

## 2018-09-18 DIAGNOSIS — Z79899 Other long term (current) drug therapy: Secondary | ICD-10-CM

## 2018-09-18 LAB — CBC WITH DIFFERENTIAL (CANCER CENTER ONLY)
Abs Immature Granulocytes: 0.03 10*3/uL (ref 0.00–0.07)
Basophils Absolute: 0.1 10*3/uL (ref 0.0–0.1)
Basophils Relative: 1 %
Eosinophils Absolute: 0.2 10*3/uL (ref 0.0–0.5)
Eosinophils Relative: 3 %
HCT: 43 % (ref 36.0–46.0)
Hemoglobin: 13.5 g/dL (ref 12.0–15.0)
Immature Granulocytes: 0 %
Lymphocytes Relative: 20 %
Lymphs Abs: 1.4 10*3/uL (ref 0.7–4.0)
MCH: 26.7 pg (ref 26.0–34.0)
MCHC: 31.4 g/dL (ref 30.0–36.0)
MCV: 85.1 fL (ref 80.0–100.0)
Monocytes Absolute: 0.7 10*3/uL (ref 0.1–1.0)
Monocytes Relative: 10 %
Neutro Abs: 4.5 10*3/uL (ref 1.7–7.7)
Neutrophils Relative %: 66 %
Platelet Count: 254 10*3/uL (ref 150–400)
RBC: 5.05 MIL/uL (ref 3.87–5.11)
RDW: 23.8 % — ABNORMAL HIGH (ref 11.5–15.5)
WBC Count: 6.8 10*3/uL (ref 4.0–10.5)
nRBC: 0 % (ref 0.0–0.2)

## 2018-09-18 LAB — CMP (CANCER CENTER ONLY)
ALT: 24 U/L (ref 0–44)
AST: 22 U/L (ref 15–41)
Albumin: 4.5 g/dL (ref 3.5–5.0)
Alkaline Phosphatase: 126 U/L (ref 38–126)
Anion gap: 13 (ref 5–15)
BUN: 16 mg/dL (ref 8–23)
CO2: 26 mmol/L (ref 22–32)
Calcium: 10.1 mg/dL (ref 8.9–10.3)
Chloride: 101 mmol/L (ref 98–111)
Creatinine: 1.12 mg/dL — ABNORMAL HIGH (ref 0.44–1.00)
GFR, Est AFR Am: >60 mL/min (ref >60)
GFR, Estimated: 52 mL/min — ABNORMAL LOW (ref >60)
Glucose, Bld: 238 mg/dL — ABNORMAL HIGH (ref 70–99)
Potassium: 4.2 mmol/L (ref 3.5–5.1)
Sodium: 140 mmol/L (ref 135–145)
Total Bilirubin: 0.5 mg/dL (ref 0.3–1.2)
Total Protein: 7.2 g/dL (ref 6.5–8.1)

## 2018-09-18 LAB — RETICULOCYTES
Immature Retic Fract: 7 % (ref 2.3–15.9)
RBC.: 5.12 MIL/uL — ABNORMAL HIGH (ref 3.87–5.11)
Retic Count, Absolute: 93.7 10*3/uL (ref 19.0–186.0)
Retic Ct Pct: 1.8 % (ref 0.4–3.1)

## 2018-09-18 LAB — IRON AND TIBC
Iron: 85 ug/dL (ref 41–142)
Saturation Ratios: 25 % (ref 21–57)
TIBC: 335 ug/dL (ref 236–444)
UIBC: 250 ug/dL (ref 120–384)

## 2018-09-18 LAB — FERRITIN: Ferritin: 408 ng/mL — ABNORMAL HIGH (ref 11–307)

## 2018-09-18 NOTE — Progress Notes (Signed)
Hematology and Oncology Follow Up Visit  [name] [name] [id number] 07-Jun-1955 63 y.o. 09/18/2018   Principle Diagnosis:   Iron deficiency anemia secondary to iron malabsorption  Current Therapy:    IV iron -- Injectafer last given on 08/27/2018     Interim History:  [name] is back for follow-up.  She is feeling so much better.  The IV iron clearly has helped her.  She had 2 doses of Injectafer.  She began to feel better a week later.  When we saw her back in March, her ferritin was only 6 with an iron saturation of 7%.  As such, she got 2 doses of Injectafer.  She has had no bleeding.  She is working from home.  She is doing a lot of yard work.  She does have diabetes.  I am sure this might be part of the issue with respect to the iron malabsorption.  She has had no change in bowel or bladder habits.  Overall, her performance status is ECOG 1.  Medications:  Current Outpatient Medications:    ACCU-CHEK FASTCLIX LANCETS MISC, CHECK BLOOD SUGAR ONCE     DAILY, Disp: 102 each, Rfl: 3   aspirin 81 MG tablet, Take 81 mg by mouth daily. , Disp: , Rfl:    Calcium Carbonate (CALTRATE 600 PO), Take 600 mg by mouth 2 (two) times daily. , Disp: , Rfl:    diltiazem (CARDIZEM CD) 240 MG 24 hr capsule, Take 1 capsule (240 mg total) by mouth daily., Disp: 90 capsule, Rfl: 3   famotidine (PEPCID) 20 MG tablet, Take 1 tablet (20 mg total) by mouth at bedtime., Disp: 90 tablet, Rfl: 3   FARXIGA 10 MG TABS tablet, TAKE 1 TABLET EVERY DAY, Disp: 90 tablet, Rfl: 3   fenofibrate 160 MG tablet, TAKE 1 TABLET BY MOUTH EVERY DAY, Disp: 90 tablet, Rfl: 0   Glucosamine-Chondroitin (OSTEO BI-FLEX REGULAR STRENGTH) 250-200 MG TABS, Take 1 tablet by mouth 2 (two) times daily. , Disp: , Rfl:    glucose blood (ACCU-CHEK SMARTVIEW) test strip, Check blood sugars once daily, Disp: 100 each, Rfl: 12   Insulin Pen Needle (B-D ULTRAFINE III SHORT PEN) 31G X 8 MM MISC, INJECT ONCE DAILY, Disp: 100  each, Rfl: 3   Krill Oil 350 MG CAPS, Take 350 mg by mouth daily. , Disp: , Rfl:    levothyroxine (SYNTHROID, LEVOTHROID) 88 MCG tablet, Take 88 mcg by mouth See admin instructions. Take 1 tablet daily and 2 tablets on Saturday, Disp: , Rfl:    Magnesium 250 MG TABS, Take 250-500 mg by mouth See admin instructions. Take 250mg  by mouth Sunday, Tuesday, Thursday and Saturday and 500mg  Monday, Wednesday and Friday, Disp: , Rfl:    metFORMIN (GLUCOPHAGE-XR) 500 MG 24 hr tablet, Take 1 tablet (500 mg total) by mouth daily., Disp: 90 tablet, Rfl: 3   metoprolol tartrate (LOPRESSOR) 25 MG tablet, Take 1 tablet (25 mg total) by mouth 2 (two) times daily., Disp: 180 tablet, Rfl: 3   nitroGLYCERIN (NITROSTAT) 0.4 MG SL tablet, Place 1 tablet (0.4 mg total) under the tongue every 5 (five) minutes as needed for chest pain., Disp: 50 tablet, Rfl: 3   ondansetron (ZOFRAN) 4 MG tablet, Take 1 tablet (4 mg total) by mouth every 8 (eight) hours as needed for nausea or vomiting., Disp: 20 tablet, Rfl: 0   pantoprazole (PROTONIX) 40 MG tablet, Take 1 tablet (40 mg total) by mouth 2 (two) times daily., Disp: 180 tablet, Rfl:  1   pioglitazone (ACTOS) 30 MG tablet, TAKE 1 TABLET BY MOUTH EVERY DAY, Disp: 90 tablet, Rfl: 3   POTASSIUM CITRATE PO, Take 99 mg by mouth daily. , Disp: , Rfl:    Probiotic Product (PROBIOTIC DAILY PO), Take 99 mg by mouth daily. , Disp: , Rfl:    repaglinide (PRANDIN) 0.5 MG tablet, TAKE 1 TABLET (0.5 MG TOTAL) BY MOUTH DAILY WITH SUPPER., Disp: 90 tablet, Rfl: 0   ticagrelor (BRILINTA) 90 MG TABS tablet, Take 1 tablet (90 mg total) by mouth 2 (two) times daily., Disp: 180 tablet, Rfl: 3   triamterene-hydrochlorothiazide (MAXZIDE-25) 37.5-25 MG tablet, Take 0.5 tablets by mouth daily., Disp: 45 tablet, Rfl: 1  Current Facility-Administered Medications:    0.9 %  sodium chloride infusion, 500 mL, Intravenous, Once, Gatha Mayer, MD  Allergies:  Allergies  Allergen Reactions    Epinephrine Other (See Comments)    Pass out    Repatha [Evolocumab] Nausea And Vomiting   Statins Other (See Comments)    Myalgias: Simvastatin also caused back ache   Welchol [Colesevelam Hcl]     myalgia   Amoxicillin Itching and Rash    Has patient had a PCN reaction causing immediate rash, facial/tongue/throat swelling, SOB or lightheadedness with hypotension: No Has patient had a PCN reaction causing severe rash involving mucus membranes or skin necrosis: No Has patient had a PCN reaction that required hospitalization: No Has patient had a PCN reaction occurring within the last 10 years: Yes If all of the above answers are "NO", then may proceed with Cephalosporin use.     Past Medical History, Surgical history, Social history, and Family History were reviewed and updated.  Review of Systems: Review of Systems  Constitutional: Positive for fatigue.  HENT:  Negative.   Eyes: Negative.   Respiratory: Negative.  Negative for chest tightness.   Cardiovascular: Negative.   Gastrointestinal: Negative.   Endocrine: Negative.   Genitourinary: Negative.    Musculoskeletal: Negative.   Skin: Negative.   Neurological: Negative.   Hematological: Negative.   Psychiatric/Behavioral: Negative.     Physical Exam:  vitals were not taken for this visit.   Wt Readings from Last 3 Encounters:  08/07/18 164 lb (74.4 kg)  08/06/18 164 lb (74.4 kg)  07/30/18 164 lb 3.2 oz (74.5 kg)    Physical Exam Vitals signs reviewed.  HENT:     Head: Normocephalic and atraumatic.  Eyes:     Pupils: Pupils are equal, round, and reactive to light.  Neck:     Musculoskeletal: Normal range of motion.  Cardiovascular:     Rate and Rhythm: Normal rate and regular rhythm.     Heart sounds: Normal heart sounds.  Pulmonary:     Effort: Pulmonary effort is normal.     Breath sounds: Normal breath sounds.  Abdominal:     General: Bowel sounds are normal.     Palpations: Abdomen is soft.    Musculoskeletal: Normal range of motion.        General: No tenderness or deformity.  Lymphadenopathy:     Cervical: No cervical adenopathy.  Skin:    General: Skin is warm and dry.     Findings: No erythema or rash.  Neurological:     Mental Status: She is alert and oriented to person, place, and time.  Psychiatric:        Behavior: Behavior normal.        Thought Content: Thought content normal.  Judgment: Judgment normal.      Lab Results  Component Value Date   WBC 6.8 09/18/2018   HGB 13.5 09/18/2018   HCT 43.0 09/18/2018   MCV 85.1 09/18/2018   PLT 254 09/18/2018     Chemistry      Component Value Date/Time   NA 137 08/07/2018 0751   NA 140 11/03/2017 1046   K 4.3 08/07/2018 0751   CL 102 08/07/2018 0751   CO2 23 08/07/2018 0751   BUN 20 08/07/2018 0751   BUN 18 11/03/2017 1046   CREATININE 1.04 (H) 08/07/2018 0751   CREATININE 0.72 06/20/2011 0925      Component Value Date/Time   CALCIUM 9.6 08/07/2018 0751   ALKPHOS 94 08/07/2018 0751   AST 16 08/07/2018 0751   ALT 15 08/07/2018 0751   BILITOT 0.4 08/07/2018 0751        Impression and Plan: Ms. Bentley is a 63 year old white female.  She has iron deficiency anemia secondary to iron malabsorption.  Everything looks fantastic with her blood.  Her MCV is back up to 85.  Her hemoglobin jumped 3 points in about 6 weeks.  For right now, I think we can just have her come back as necessary.  I do not think we need a regular appointment for her.  I think that if she does start to feel tired, she can was come back and we check her blood work.  She is happy with this recommendation.  I did tell her to make sure that she wears sunscreen when she is working outside and that she drink a lot of water.     Volanda Napoleon, MD 4/24/20208:22 AM

## 2018-09-26 ENCOUNTER — Other Ambulatory Visit: Payer: Self-pay | Admitting: Endocrinology

## 2018-10-02 ENCOUNTER — Other Ambulatory Visit: Payer: Self-pay

## 2018-10-02 ENCOUNTER — Ambulatory Visit: Payer: BLUE CROSS/BLUE SHIELD | Admitting: Endocrinology

## 2018-10-02 ENCOUNTER — Encounter: Payer: Self-pay | Admitting: Endocrinology

## 2018-10-02 VITALS — BP 120/60 | HR 82 | Ht 65.5 in | Wt 169.2 lb

## 2018-10-02 DIAGNOSIS — E1143 Type 2 diabetes mellitus with diabetic autonomic (poly)neuropathy: Secondary | ICD-10-CM

## 2018-10-02 DIAGNOSIS — E119 Type 2 diabetes mellitus without complications: Secondary | ICD-10-CM | POA: Diagnosis not present

## 2018-10-02 LAB — POCT GLYCOSYLATED HEMOGLOBIN (HGB A1C): Hemoglobin A1C: 6.5 % — AB (ref 4.0–5.6)

## 2018-10-02 NOTE — Progress Notes (Signed)
Subjective:    Patient ID: [name] [name], female    DOB: 04/12/1956, 63 y.o.   MRN: [id number]  HPI Pt returns for f/u of diabetes mellitus: DM type: 2 Dx'ed: 7673 Complications: gastroparesis and renal insuff Therapy: 4 oral meds.   GDM: never DKA: never.  Severe hypoglycemia: never.  Pancreatitis: never.  Other: she has never been on insulin; diarrhea and renal insuff limits metformin dosage; she did not tolerate parlodel (vomiting), or victoza (nausea); edema limits pioglitazone dosage.   Interval history: She does not miss med doses.  Nausea is resolved.  Meter is downloaded today, and the printout is scanned into the record.  cbg's are al checked before supper per meter, but pt says this is fasting.  It varies from 121-162 Past Medical History:  Diagnosis Date  . Asthma   . Chronic pain disorder    neck and shoulder  . CTS (carpal tunnel syndrome) 01/27/2013   right  . Diabetes mellitus type II   . Diabetic gastroparesis ([place])   . Esophageal reflux 08/25/2013   [facility], Dr [name] [name]  . FATTY LIVER DISEASE 04/27/2007   Qualifier: Diagnosis of  By: [name] [name], [name] [name]    . Fundic gland polyps of stomach, benign   . HTN (hypertension)   . Hyperlipidemia, mixed 05/04/2007   Qualifier: Diagnosis of  By: [name] [name] crestor caused myalgias even at low dose Livalo caused myalgias Lipitor  Mother with severe reaction myalgias Simvastatin, Welchol   . Hypertension 08/24/2010  . Hypokalemia 05/25/2013   Improved stopping HCTZ. Was noted to have an elevated glucose when K was low. Recheck renal next week after starting Maxzide  . Hypomagnesemia 08/10/2014  . Hypothyroidism   . Iron deficiency anemia 08/07/2018  . Iron malabsorption 08/07/2018  . Laryngitis 08/25/2017  . Leg cramps 04/15/2016  . Nausea without vomiting 08/08/2014  . NONSPEC ELEVATION OF LEVELS OF TRANSAMINASE/LDH 05/01/2007   Qualifier: Diagnosis of  By: [name] [name]    . Osteoarthritis    chronic, right  knee (11/04/2017)  . OVARIAN CYST 07/11/2009   Qualifier: Diagnosis of  By: [name] [name]   . Ovarian cyst   . Plantar fasciitis   . Rosacea 10/22/2016  . Urine incontinence     Past Surgical History:  Procedure Laterality Date  . AUGMENTATION MAMMAPLASTY Bilateral 2002  . BLADDER SUSPENSION  1981  . COLONOSCOPY    . CORONARY STENT INTERVENTION N/A 11/04/2017   Procedure: CORONARY STENT INTERVENTION;  Surgeon: [name] Bush, MD;  Location: [facility];  Service: Cardiovascular;  Laterality: N/A;  . ESOPHAGOGASTRODUODENOSCOPY    . INTRAVASCULAR PRESSURE WIRE/FFR STUDY N/A 11/04/2017   Procedure: INTRAVASCULAR PRESSURE WIRE/FFR STUDY;  Surgeon: [name] Bush, MD;  Location: [facility];  Service: Cardiovascular;  Laterality: N/A;  . INTRAVASCULAR ULTRASOUND/IVUS N/A 11/04/2017   Procedure: Intravascular Ultrasound/IVUS;  Surgeon: [name] Bush, MD;  Location: [facility];  Service: Cardiovascular;  Laterality: N/A;  . KNEE ARTHROSCOPY  1998, [id number]  . LAPAROSCOPIC CHOLECYSTECTOMY  2007  . LEFT HEART CATH AND CORONARY ANGIOGRAPHY N/A 11/04/2017   Procedure: LEFT HEART CATH AND CORONARY ANGIOGRAPHY;  Surgeon: [name] Bush, MD;  Location: [facility];  Service: Cardiovascular;  Laterality: N/A;  . LEFT HEART CATHETERIZATION WITH CORONARY ANGIOGRAM N/A 11/21/2011   Procedure: LEFT HEART CATHETERIZATION WITH CORONARY ANGIOGRAM;  Surgeon: [name] Hector, MD;  Location: [facility];  Service: Cardiovascular;  Laterality: N/A;  . TONSILLECTOMY  [place]   "still have my ovaries"    Social History   Socioeconomic History  . Marital status: Married    Spouse name: Not on file  . Number of children: 1  . Years of education: Not on file  . Highest education level: Not on file  Occupational History  . Occupation: IT TECH, admin. assistant    Employer: [name]  Social Needs  . Financial resource strain: Not on file  .  Food insecurity:    Worry: Not on file    Inability: Not on file  . Transportation needs:    Medical: Not on file    Non-medical: Not on file  Tobacco Use  . Smoking status: Never Smoker  . Smokeless tobacco: Never Used  Substance and Sexual Activity  . Alcohol use: Not Currently  . Drug use: Never  . Sexual activity: Not Currently    Partners: Male  Lifestyle  . Physical activity:    Days per week: Not on file    Minutes per session: Not on file  . Stress: Not on file  Relationships  . Social connections:    Talks on phone: Not on file    Gets together: Not on file    Attends religious service: Not on file    Active member of club or organization: Not on file    Attends meetings of clubs or organizations: Not on file    Relationship status: Not on file  . Intimate partner violence:    Fear of current or ex partner: Not on file    Emotionally abused: Not on file    Physically abused: Not on file    Forced sexual activity: Not on file  Other Topics Concern  . Not on file  Social History Narrative   Marital status/children/pets: Married, 1 child.    Education/employment: HS   Safety:      -smoke alarm in the home:Yes     - wears seatbelt: Yes     - Feels safe in their relationships: Yes    Current Outpatient Medications on File Prior to Visit  Medication Sig Dispense Refill  . ACCU-CHEK FASTCLIX LANCETS MISC CHECK BLOOD SUGAR ONCE     DAILY 102 each 3  . aspirin 81 MG tablet Take 81 mg by mouth daily.     . Calcium Carbonate (CALTRATE 600 PO) Take 600 mg by mouth 2 (two) times daily.     . famotidine (PEPCID) 20 MG tablet Take 1 tablet (20 mg total) by mouth at bedtime. 90 tablet 3  . FARXIGA 10 MG TABS tablet TAKE 1 TABLET BY MOUTH EVERY DAY 90 tablet 3  . fenofibrate 160 MG tablet TAKE 1 TABLET BY MOUTH EVERY DAY 90 tablet 0  . Glucosamine-Chondroitin (OSTEO BI-FLEX REGULAR STRENGTH) 250-200 MG TABS Take 1 tablet by mouth 2 (two) times daily.     Marland Kitchen glucose blood  (ACCU-CHEK SMARTVIEW) test strip Check blood sugars once daily 100 each 12  . Krill Oil 350 MG CAPS Take 350 mg by mouth daily.     Marland Kitchen levothyroxine (SYNTHROID, LEVOTHROID) 88 MCG tablet Take 88 mcg by mouth See admin instructions. Take 1 tablet daily and 2 tablets on Saturday    . Magnesium 250 MG TABS Take 250-500 mg by mouth See admin instructions. Take 250mg  by mouth Sunday, Tuesday, Thursday and Saturday and 500mg  Monday, Wednesday and Friday    . metFORMIN (GLUCOPHAGE-XR) 500 MG 24 hr tablet Take 1  tablet (500 mg total) by mouth daily. 90 tablet 3  . metoprolol tartrate (LOPRESSOR) 25 MG tablet Take 1 tablet (25 mg total) by mouth 2 (two) times daily. 180 tablet 3  . nitroGLYCERIN (NITROSTAT) 0.4 MG SL tablet Place 1 tablet (0.4 mg total) under the tongue every 5 (five) minutes as needed for chest pain. 50 tablet 3  . ondansetron (ZOFRAN) 4 MG tablet Take 1 tablet (4 mg total) by mouth every 8 (eight) hours as needed for nausea or vomiting. 20 tablet 0  . pantoprazole (PROTONIX) 40 MG tablet Take 1 tablet (40 mg total) by mouth 2 (two) times daily. 180 tablet 1  . pioglitazone (ACTOS) 30 MG tablet TAKE 1 TABLET BY MOUTH EVERY DAY 90 tablet 3  . POTASSIUM CITRATE PO Take 99 mg by mouth daily.     . Probiotic Product (PROBIOTIC DAILY PO) Take 99 mg by mouth daily.     . repaglinide (PRANDIN) 0.5 MG tablet TAKE 1 TABLET (0.5 MG TOTAL) BY MOUTH DAILY WITH SUPPER. 90 tablet 0  . ticagrelor (BRILINTA) 90 MG TABS tablet Take 1 tablet (90 mg total) by mouth 2 (two) times daily. 180 tablet 3  . triamterene-hydrochlorothiazide (MAXZIDE-25) 37.5-25 MG tablet Take 0.5 tablets by mouth daily. 45 tablet 1  . diltiazem (CARDIZEM CD) 240 MG 24 hr capsule Take 1 capsule (240 mg total) by mouth daily. 90 capsule 3   Current Facility-Administered Medications on File Prior to Visit  Medication Dose Route Frequency Provider Last Rate Last Dose  . 0.9 %  sodium chloride infusion  500 mL Intravenous Once Gatha Mayer, MD        Allergies  Allergen Reactions  . Epinephrine Other (See Comments)    Pass out   . Repatha [Evolocumab] Nausea And Vomiting  . Statins Other (See Comments)    Myalgias: Simvastatin also caused back ache  . Welchol [Colesevelam Hcl]     myalgia  . Amoxicillin Itching and Rash    Has patient had a PCN reaction causing immediate rash, facial/tongue/throat swelling, SOB or lightheadedness with hypotension: No Has patient had a PCN reaction causing severe rash involving mucus membranes or skin necrosis: No Has patient had a PCN reaction that required hospitalization: No Has patient had a PCN reaction occurring within the last 10 years: Yes If all of the above answers are "NO", then may proceed with Cephalosporin use.     Family History  Problem Relation Age of Onset  . Alzheimer's disease Mother   . Diabetes type II Mother   . Hiatal hernia Mother   . Diabetes Mother   . Emphysema Father   . COPD Father   . Depression Sister        suicide  . Diabetes Sister   . Diabetes Daughter   . Colon cancer Neg Hx   . Stomach cancer Neg Hx     BP 120/60 (BP Location: Left Arm, Patient Position: Sitting, Cuff Size: Normal)   Pulse 82   Ht 5' 5.5" (1.664 m)   Wt 169 lb 3.2 oz (76.7 kg)   SpO2 96%   BMI 27.73 kg/m    Review of Systems She denies hypoglycemia    Objective:   Physical Exam VITAL SIGNS:  See vs page GENERAL: no distress Pulses: dorsalis pedis intact bilat.   MSK: no deformity of the feet CV: no leg edema Skin:  no ulcer on the feet.  normal color and temp on the feet. Neuro: sensation is  intact to touch on the feet   A1c=6.5% Lab Results  Component Value Date   CREATININE 1.12 (H) 09/18/2018   BUN 16 09/18/2018   NA 140 09/18/2018   K 4.2 09/18/2018   CL 101 09/18/2018   CO2 26 09/18/2018       Assessment & Plan:  Type 2 DM, with GP, well-controlled Renal insuff: This limits rx options.   Patient Instructions  check your blood  sugar once a day.  vary the time of day when you check, between before the 3 meals, and at bedtime.  also check if you have symptoms of your blood sugar being too high or too low.  please keep a record of the readings and bring it to your next appointment here.  You can write it on any piece of paper.  please call us sooner if your blood sugar goes below 70, or if you have a lot of readings over 200.   Please continue the same diabetes medications.  Please come back for a follow-up appointment in 4 months.

## 2018-10-02 NOTE — Patient Instructions (Addendum)
check your blood sugar once a day.  vary the time of day when you check, between before the 3 meals, and at bedtime.  also check if you have symptoms of your blood sugar being too high or too low.  please keep a record of the readings and bring it to your next appointment here.  You can write it on any piece of paper.  please call us sooner if your blood sugar goes below 70, or if you have a lot of readings over 200.   Please continue the same diabetes medications.  Please come back for a follow-up appointment in 4 months.

## 2018-10-16 ENCOUNTER — Encounter: Payer: Self-pay | Admitting: Internal Medicine

## 2018-10-26 ENCOUNTER — Other Ambulatory Visit: Payer: Self-pay

## 2018-10-26 MED ORDER — METOPROLOL TARTRATE 25 MG PO TABS: 25.0000 mg | ORAL_TABLET | Freq: Two times a day (BID) | ORAL | 3 refills | Status: DC

## 2018-10-27 ENCOUNTER — Other Ambulatory Visit: Payer: Self-pay

## 2018-10-27 MED ORDER — TICAGRELOR 90 MG PO TABS: 90.0000 mg | ORAL_TABLET | Freq: Two times a day (BID) | ORAL | 3 refills | Status: DC

## 2018-11-03 ENCOUNTER — Ambulatory Visit (INDEPENDENT_AMBULATORY_CARE_PROVIDER_SITE_OTHER): Payer: BC Managed Care – PPO | Admitting: Internal Medicine

## 2018-11-03 ENCOUNTER — Encounter: Payer: Self-pay | Admitting: Internal Medicine

## 2018-11-03 ENCOUNTER — Other Ambulatory Visit: Payer: Self-pay

## 2018-11-03 DIAGNOSIS — D509 Iron deficiency anemia, unspecified: Secondary | ICD-10-CM

## 2018-11-03 MED ORDER — NA SULFATE-K SULFATE-MG SULF 17.5-3.13-1.6 GM/177ML PO SOLN: 1.0000 | Freq: Once | ORAL | 0 refills | Status: AC

## 2018-11-03 NOTE — Patient Instructions (Addendum)
I am glad to hear your vomiting problems are resolved off the Victoza.  We will schedule a colonoscopy for a date when we know you will have been off the Brilinta for a week or more.  You may continue your aspirin through your colonoscopy.  Our instructions will explain how to modify your diabetes medications prior to the colonoscopy as well.  We need to make sure the iron deficiency anemia was not coming from a problem in the colon.   We will use Suprep to clean you out as we discussed.  It is covered on your formulary.  If it is extremely expensive despite that let me know and we can try something else.   I appreciate the opportunity to care for you. Gatha Mayer, MD, [facility]

## 2018-11-03 NOTE — Progress Notes (Signed)
TELEHEALTH ENCOUNTER IN SETTING OF COVID-19 PANDEMIC - REQUESTED BY PATIENT SERVICE PROVIDED BY TELEMEDECINE - TYPE: Zoom AV PATIENT LOCATION: Home PATIENT HAS CONSENTED TO TELEHEALTH VISIT PROVIDER LOCATION: OFFICE REFERRING PROVIDER:Dr. Raoul [name] PARTICIPANTS OTHER THAN PATIENT:none TIME SPENT ON CALL:9 mins    [name] [name] 63 y.o. 04/04/1956 [id number]  Assessment & Plan:  Iron deficiency anemia Time for colonoscopy now that she is coming off Brilinta  Will schedule after we know she is off Brilinta x 1 week. ASA ok  The risks and benefits as well as alternatives of endoscopic procedure(s) have been discussed and reviewed. All questions answered. The patient agrees to proceed. She understands there is potential to contract Covid-19 from this encounter for colonoscopy.   I appreciate the opportunity to care for this patient. CC: [name] [name]    Subjective:   Chief Complaint: Iron deficiency anemia  HPI [name] [name] is here for follow-up, on the Zoom A/V visit, she had iron deficiency anemia as well as a lot of nausea and vomiting thought to be from gastroparesis.  Earlier in the year she underwent an EGD which did not reveal any problems other than some benign gastric polyps.  Victoza was stopped and her vomiting resolved.  She is about to stop her Brilinta that was placed about a year ago when she had coronary artery stenting.  Dr. Marin [name] is treating her with iron infusion therapy.  Feraheme I believe. Allergies  Allergen Reactions  . Epinephrine Other (See Comments)    Pass out   . Repatha [Evolocumab] Nausea And Vomiting  . Statins Other (See Comments)    Myalgias: Simvastatin also caused back ache  . Welchol [Colesevelam Hcl]     myalgia  . Amoxicillin Itching and Rash    Has patient had a PCN reaction causing immediate rash, facial/tongue/throat swelling, SOB or lightheadedness with hypotension: No Has patient had a PCN reaction causing severe rash  involving mucus membranes or skin necrosis: No Has patient had a PCN reaction that required hospitalization: No Has patient had a PCN reaction occurring within the last 10 years: Yes If all of the above answers are "NO", then may proceed with Cephalosporin use.    Current Meds  Medication Sig  . ACCU-CHEK FASTCLIX LANCETS MISC CHECK BLOOD SUGAR ONCE     DAILY  . aspirin 81 MG tablet Take 81 mg by mouth daily.   . Calcium Carbonate (CALTRATE 600 PO) Take 600 mg by mouth 2 (two) times daily.   . famotidine (PEPCID) 20 MG tablet Take 1 tablet (20 mg total) by mouth at bedtime.  Marland Kitchen FARXIGA 10 MG TABS tablet TAKE 1 TABLET BY MOUTH EVERY DAY  . fenofibrate 160 MG tablet TAKE 1 TABLET BY MOUTH EVERY DAY  . Glucosamine-Chondroitin (OSTEO BI-FLEX REGULAR STRENGTH) 250-200 MG TABS Take 1 tablet by mouth 2 (two) times daily.   Marland Kitchen glucose blood (ACCU-CHEK SMARTVIEW) test strip Check blood sugars once daily  . Krill Oil 350 MG CAPS Take 350 mg by mouth daily.   Marland Kitchen levothyroxine (SYNTHROID, LEVOTHROID) 88 MCG tablet Take 88 mcg by mouth See admin instructions. Take 1 tablet daily and 2 tablets on Saturday  . Magnesium 250 MG TABS Take 1 tablet by mouth daily.  . metFORMIN (GLUCOPHAGE-XR) 500 MG 24 hr tablet Take 1 tablet (500 mg total) by mouth daily.  . metoprolol tartrate (LOPRESSOR) 25 MG tablet Take 1 tablet (25 mg total) by mouth 2 (two) times daily.  . nitroGLYCERIN (NITROSTAT)  0.4 MG SL tablet Place 1 tablet (0.4 mg total) under the tongue every 5 (five) minutes as needed for chest pain.  Marland Kitchen ondansetron (ZOFRAN) 4 MG tablet Take 1 tablet (4 mg total) by mouth every 8 (eight) hours as needed for nausea or vomiting.  . pantoprazole (PROTONIX) 40 MG tablet Take 1 tablet (40 mg total) by mouth 2 (two) times daily.  . pioglitazone (ACTOS) 30 MG tablet TAKE 1 TABLET BY MOUTH EVERY DAY  . POTASSIUM CITRATE PO Take 99 mg by mouth daily.   . Probiotic Product (PROBIOTIC DAILY PO) Take 99 mg by mouth daily.    . repaglinide (PRANDIN) 0.5 MG tablet TAKE 1 TABLET (0.5 MG TOTAL) BY MOUTH DAILY WITH SUPPER.  . ticagrelor (BRILINTA) 90 MG TABS tablet Take 1 tablet (90 mg total) by mouth 2 (two) times daily.  Marland Kitchen triamterene-hydrochlorothiazide (MAXZIDE-25) 37.5-25 MG tablet Take 0.5 tablets by mouth daily.   Past Medical History:  Diagnosis Date  . Asthma   . Chronic pain disorder    neck and shoulder  . CTS (carpal tunnel syndrome) 01/27/2013   right  . Diabetes mellitus type II   . Diabetic gastroparesis ([place])   . Esophageal reflux 08/25/2013   [facility] [place], Dr Barth Kirks  . FATTY LIVER DISEASE 04/27/2007   Qualifier: Diagnosis of  By: Danelle Earthly CMA, Darlene    . Fundic gland polyps of stomach, benign   . HTN (hypertension)   . Hyperlipidemia, mixed 05/04/2007   Qualifier: Diagnosis of  By: Wynona Luna crestor caused myalgias even at low dose Livalo caused myalgias Lipitor  Mother with severe reaction myalgias Simvastatin, Welchol   . Hypertension 08/24/2010  . Hypokalemia 05/25/2013   Improved stopping HCTZ. Was noted to have an elevated glucose when K was low. Recheck renal next week after starting Maxzide  . Hypomagnesemia 08/10/2014  . Hypothyroidism   . Iron deficiency anemia 08/07/2018  . Iron malabsorption 08/07/2018  . Laryngitis 08/25/2017  . Leg cramps 04/15/2016  . Nausea without vomiting 08/08/2014  . NONSPEC ELEVATION OF LEVELS OF TRANSAMINASE/LDH 05/01/2007   Qualifier: Diagnosis of  By: Garner Gavel    . Osteoarthritis    chronic, right knee (11/04/2017)  . OVARIAN CYST 07/11/2009   Qualifier: Diagnosis of  By: Wynona Luna   . Ovarian cyst   . Plantar fasciitis   . Rosacea 10/22/2016  . Urine incontinence    Past Surgical History:  Procedure Laterality Date  . AUGMENTATION MAMMAPLASTY Bilateral 2002  . BLADDER SUSPENSION  1981  . COLONOSCOPY    . CORONARY STENT INTERVENTION N/A 11/04/2017   Procedure: CORONARY STENT INTERVENTION;  Surgeon: Nelva Bush, MD;   Location: [facility];  Service: Cardiovascular;  Laterality: N/A;  . ESOPHAGOGASTRODUODENOSCOPY    . INTRAVASCULAR PRESSURE WIRE/FFR STUDY N/A 11/04/2017   Procedure: INTRAVASCULAR PRESSURE WIRE/FFR STUDY;  Surgeon: Nelva Bush, MD;  Location: [facility];  Service: Cardiovascular;  Laterality: N/A;  . INTRAVASCULAR ULTRASOUND/IVUS N/A 11/04/2017   Procedure: Intravascular Ultrasound/IVUS;  Surgeon: Nelva Bush, MD;  Location: [facility];  Service: Cardiovascular;  Laterality: N/A;  . KNEE ARTHROSCOPY  1998, [id number]  . LAPAROSCOPIC CHOLECYSTECTOMY  2007  . LEFT HEART CATH AND CORONARY ANGIOGRAPHY N/A 11/04/2017   Procedure: LEFT HEART CATH AND CORONARY ANGIOGRAPHY;  Surgeon: Nelva Bush, MD;  Location: [facility];  Service: Cardiovascular;  Laterality: N/A;  . LEFT HEART CATHETERIZATION WITH CORONARY ANGIOGRAM N/A 11/21/2011   Procedure: LEFT  HEART CATHETERIZATION WITH CORONARY ANGIOGRAM;  Surgeon: Josue Hector, MD;  Location: [facility];  Service: Cardiovascular;  Laterality: N/A;  . TONSILLECTOMY  1975  . VAGINAL HYSTERECTOMY  1983   "still have my ovaries"   Social History   Social History Narrative   Marital status/children/pets: Married, 1 child.    Education/employment: HS   Safety:      -smoke alarm in the home:Yes     - wears seatbelt: Yes     - Feels safe in their relationships: Yes   family history includes Alzheimer's disease in her mother; COPD in her father; Depression in her sister; Diabetes in her daughter, mother, and sister; Diabetes type II in her mother; Emphysema in her father; Hiatal hernia in her mother.   Review of Systems As above

## 2018-11-03 NOTE — Assessment & Plan Note (Signed)
Time for colonoscopy now that she is coming off Brilinta  Will schedule after we know she is off Brilinta x 1 week. ASA ok  The risks and benefits as well as alternatives of endoscopic procedure(s) have been discussed and reviewed. All questions answered. The patient agrees to proceed. She understands there is potential to contract Covid-19 from this encounter for colonoscopy.

## 2018-11-23 ENCOUNTER — Other Ambulatory Visit: Payer: Self-pay | Admitting: Cardiology

## 2018-11-24 ENCOUNTER — Encounter: Payer: Self-pay | Admitting: Family Medicine

## 2018-11-24 ENCOUNTER — Ambulatory Visit (INDEPENDENT_AMBULATORY_CARE_PROVIDER_SITE_OTHER): Payer: BC Managed Care – PPO | Admitting: Family Medicine

## 2018-11-24 ENCOUNTER — Other Ambulatory Visit: Payer: Self-pay

## 2018-11-24 VITALS — Ht 65.5 in | Wt 171.0 lb

## 2018-11-24 DIAGNOSIS — S39012A Strain of muscle, fascia and tendon of lower back, initial encounter: Secondary | ICD-10-CM

## 2018-11-24 MED ORDER — CYCLOBENZAPRINE HCL 5 MG PO TABS: 5.0000 mg | ORAL_TABLET | Freq: Three times a day (TID) | ORAL | 1 refills | Status: DC | PRN

## 2018-11-24 MED ORDER — NAPROXEN 500 MG PO TABS: 500.0000 mg | ORAL_TABLET | Freq: Two times a day (BID) | ORAL | 0 refills | Status: DC

## 2018-11-24 NOTE — Patient Instructions (Signed)
Lumbar Strain A lumbar strain, which is sometimes called a low-back strain, is a stretch or tear in a muscle or the strong cords of tissue that attach muscle to bone (tendons) in the lower back (lumbar spine). This type of injury occurs when muscles or tendons are torn or are stretched beyond their limits. Lumbar strains can range from mild to severe. Mild strains may involve stretching a muscle or tendon without tearing it. These may heal in 1-2 weeks. More severe strains involve tearing of muscle fibers or tendons. These will cause more pain and may take 6-8 weeks to heal. What are the causes? This condition may be caused by:  Trauma, such as a fall or a hit to the body.  Twisting or overstretching the back. This may result from doing activities that need a lot of energy, such as lifting heavy objects. What increases the risk? This injury is more common in:  Athletes.  People with obesity.  People who do repeated lifting, bending, or other movements that involve their back. What are the signs or symptoms? Symptoms of this condition may include:  Sharp or dull pain in the lower back that does not go away. The pain may extend to the buttocks.  Stiffness or limited range of motion.  Sudden muscle tightening (spasms). How is this diagnosed? This condition may be diagnosed based on:  Your symptoms.  Your medical history.  A physical exam.  Imaging tests, such as: ? X-rays. ? MRI. How is this treated? Treatment for this condition may include:  Rest.  Applying heat and cold to the affected area.  Over-the-counter medicines to help relieve pain and inflammation, such as NSAIDs.  Prescription pain medicine and muscle relaxants may be needed for a short time.  Physical therapy. Follow these instructions at home: Managing pain, stiffness, and swelling      If directed, put ice on the injured area during the first 24 hours after your injury. ? Put ice in a plastic bag.  ? Place a towel between your skin and the bag. ? Leave the ice on for 20 minutes, 2-3 times a day.  If directed, apply heat to the affected area as often as told by your health care provider. Use the heat source that your health care provider recommends, such as a moist heat pack or a heating pad. ? Place a towel between your skin and the heat source. ? Leave the heat on for 20-30 minutes. ? Remove the heat if your skin turns bright red. This is especially important if you are unable to feel pain, heat, or cold. You may have a greater risk of getting burned. Activity  Rest and return to your normal activities as told by your health care provider. Ask your health care provider what activities are safe for you.  Do exercises as told by your health care provider. Medicines  Take over-the-counter and prescription medicines only as told by your health care provider.  Ask your health care provider if the medicine prescribed to you: ? Requires you to avoid driving or using heavy machinery. ? Can cause constipation. You may need to take these actions to prevent or treat constipation:  Drink enough fluid to keep your urine pale yellow.  Take over-the-counter or prescription medicines.  Eat foods that are high in fiber, such as beans, whole grains, and fresh fruits and vegetables.  Limit foods that are high in fat and processed sugars, such as fried or sweet foods. Injury prevention To prevent  a future low-back injury:  Always warm up properly before physical activity or sports.  Cool down and stretch after being active.  Use correct form when playing sports and lifting heavy objects. Bend your knees before you lift heavy objects.  Use good posture when sitting and standing.  Stay physically fit and keep a healthy weight. ? Do at least 150 minutes of moderate-intensity exercise each week, such as brisk walking or water aerobics. ? Do strength exercises at least 2 times each week.   General instructions  Do not use any products that contain nicotine or tobacco, such as cigarettes, e-cigarettes, and chewing tobacco. If you need help quitting, ask your health care provider.  Keep all follow-up visits as told by your health care provider. This is important. Contact a health care provider if:  Your back pain does not improve after 6 weeks of treatment.  Your symptoms get worse. Get help right away if:  Your back pain is severe.  You are unable to stand or walk.  You develop pain in your legs.  You develop weakness in your buttocks or legs.  You have difficulty controlling when you urinate or when you have a bowel movement. ? You have frequent, painful, or bloody urination. ? You have a temperature over 101.43F (38.3C) Summary  A lumbar strain, which is sometimes called a low-back strain, is a stretch or tear in a muscle or the strong cords of tissue that attach muscle to bone (tendons) in the lower back (lumbar spine).  This type of injury occurs when muscles or tendons are torn or are stretched beyond their limits.  Rest and return to your normal activities as told by your health care provider. If directed, apply heat and ice to the affected area as often as told by your health care provider.  Take over-the-counter and prescription medicines only as told by your health care provider.  Contact a health care provider if you have new or worsening symptoms. This information is not intended to replace advice given to you by your health care provider. Make sure you discuss any questions you have with your health care provider. Document Released: 05/13/2005 Document Revised: 03/12/2018 Document Reviewed: 03/12/2018 Elsevier Patient Education  2020 [employer].

## 2018-11-24 NOTE — Telephone Encounter (Signed)
Patient is now seeing Dr. Percival Spanish. Please refill. Thanks!

## 2018-11-24 NOTE — Progress Notes (Signed)
VIRTUAL VISIT VIA VIDEO  I connected with [name] [name] on 11/24/18 at  4:00 PM EDT by a video enabled telemedicine application and verified that I am speaking with the correct person using two identifiers. Location patient: Home Location provider: [facility], Office Persons participating in the virtual visit: Patient, Dr. Raoul Pitch and R.Baker, LPN  I discussed the limitations of evaluation and management by telemedicine and the availability of in person appointments. The patient expressed understanding and agreed to proceed.   SUBJECTIVE Chief Complaint  Patient presents with  . Back Pain    Lower left Back pain since Sunday. Pt was helping husband after knee surgery and hurt back.     HPI: ADWOA AXE is a 63 y.o. female present to discuss back pain.  Patient reports she has been helping her husband after his knee surgery and she strained her lower back in the process on Sunday.  She reports the pain is in the left lower side of her back.  She denies any radiation of pain.  Walking makes the pain worse.  Sitting or bending forward makes the pain better.  She denies any bladder or bowel dysfunction.  She denies any prior history of back injury or surgery.  SHe has tried using Biofreeze and heat for comfort.  ROS: See pertinent positives and negatives per HPI.  Patient Active Problem List   Diagnosis Date Noted  . Iron deficiency anemia 08/07/2018  . Iron malabsorption 08/07/2018  . Epigastric pain 06/26/2018  . Hair thinning 06/08/2018  . Nausea 06/08/2018  . Globus sensation 04/13/2018  . CAD S/P percutaneous coronary angioplasty 11/12/2017  . Chronic pain of right upper extremity 11/10/2017  . Abnormal cardiac CT angiography 11/04/2017  . Anemia 10/12/2017  . Rosacea 10/22/2016  . Focal muscle atrophy 09/26/2015  . Non-insulin dependent type 2 diabetes mellitus ([place]) 07/26/2015  . Hypomagnesemia 08/10/2014  . Diabetic gastroparesis ([place]) 07/19/2014  .  Esophageal reflux 08/25/2013  . Hypokalemia 05/25/2013  . CTS (carpal tunnel syndrome) 01/27/2013  . Hypertension 08/24/2010  . Hyperlipidemia LDL goal <70 05/04/2007  . NONSPEC ELEVATION OF LEVELS OF TRANSAMINASE/LDH 05/01/2007  . Hypothyroidism 04/27/2007  . FATTY LIVER DISEASE 04/27/2007    Social History   Tobacco Use  . Smoking status: Never Smoker  . Smokeless tobacco: Never Used  Substance Use Topics  . Alcohol use: Not Currently    Current Outpatient Medications:  .  ACCU-CHEK FASTCLIX LANCETS MISC, CHECK BLOOD SUGAR ONCE     DAILY, Disp: 102 each, Rfl: 3 .  aspirin 81 MG tablet, Take 81 mg by mouth daily. , Disp: , Rfl:  .  Calcium Carbonate (CALTRATE 600 PO), Take 600 mg by mouth 2 (two) times daily. , Disp: , Rfl:  .  famotidine (PEPCID) 20 MG tablet, Take 1 tablet (20 mg total) by mouth at bedtime., Disp: 90 tablet, Rfl: 3 .  FARXIGA 10 MG TABS tablet, TAKE 1 TABLET BY MOUTH EVERY DAY, Disp: 90 tablet, Rfl: 3 .  fenofibrate 160 MG tablet, TAKE 1 TABLET BY MOUTH EVERY DAY, Disp: 90 tablet, Rfl: 0 .  Glucosamine-Chondroitin (OSTEO BI-FLEX REGULAR STRENGTH) 250-200 MG TABS, Take 1 tablet by mouth 2 (two) times daily. , Disp: , Rfl:  .  glucose blood (ACCU-CHEK SMARTVIEW) test strip, Check blood sugars once daily, Disp: 100 each, Rfl: 12 .  Krill Oil 350 MG CAPS, Take 350 mg by mouth daily. , Disp: , Rfl:  .  levothyroxine (SYNTHROID, LEVOTHROID) 88 MCG  tablet, Take 88 mcg by mouth See admin instructions. Take 1 tablet daily and 2 tablets on Saturday, Disp: , Rfl:  .  Magnesium 250 MG TABS, Take 1 tablet by mouth daily., Disp: , Rfl:  .  metFORMIN (GLUCOPHAGE-XR) 500 MG 24 hr tablet, Take 1 tablet (500 mg total) by mouth daily., Disp: 90 tablet, Rfl: 3 .  metoprolol tartrate (LOPRESSOR) 25 MG tablet, Take 1 tablet (25 mg total) by mouth 2 (two) times daily., Disp: 180 tablet, Rfl: 3 .  nitroGLYCERIN (NITROSTAT) 0.4 MG SL tablet, Place 1 tablet (0.4 mg total) under the tongue  every 5 (five) minutes as needed for chest pain., Disp: 50 tablet, Rfl: 3 .  pantoprazole (PROTONIX) 40 MG tablet, Take 1 tablet (40 mg total) by mouth 2 (two) times daily., Disp: 180 tablet, Rfl: 1 .  pioglitazone (ACTOS) 30 MG tablet, TAKE 1 TABLET BY MOUTH EVERY DAY, Disp: 90 tablet, Rfl: 3 .  POTASSIUM CITRATE PO, Take 99 mg by mouth daily. , Disp: , Rfl:  .  Probiotic Product (PROBIOTIC DAILY PO), Take 99 mg by mouth daily. , Disp: , Rfl:  .  repaglinide (PRANDIN) 0.5 MG tablet, TAKE 1 TABLET (0.5 MG TOTAL) BY MOUTH DAILY WITH SUPPER., Disp: 90 tablet, Rfl: 0 .  triamterene-hydrochlorothiazide (MAXZIDE-25) 37.5-25 MG tablet, Take 0.5 tablets by mouth daily., Disp: 45 tablet, Rfl: 1 .  ondansetron (ZOFRAN) 4 MG tablet, Take 1 tablet (4 mg total) by mouth every 8 (eight) hours as needed for nausea or vomiting. (Patient not taking: Reported on 11/24/2018), Disp: 20 tablet, Rfl: 0 .  ticagrelor (BRILINTA) 90 MG TABS tablet, Take 1 tablet (90 mg total) by mouth 2 (two) times daily. (Patient not taking: Reported on 11/24/2018), Disp: 180 tablet, Rfl: 3  Allergies  Allergen Reactions  . Epinephrine Other (See Comments)    Pass out   . Repatha [Evolocumab] Nausea And Vomiting  . Statins Other (See Comments)    Myalgias: Simvastatin also caused back ache  . Welchol [Colesevelam Hcl]     myalgia  . Amoxicillin Itching and Rash    Has patient had a PCN reaction causing immediate rash, facial/tongue/throat swelling, SOB or lightheadedness with hypotension: No Has patient had a PCN reaction causing severe rash involving mucus membranes or skin necrosis: No Has patient had a PCN reaction that required hospitalization: No Has patient had a PCN reaction occurring within the last 10 years: Yes If all of the above answers are "NO", then may proceed with Cephalosporin use.     OBJECTIVE: Ht 5' 5.5" (1.664 m)   Wt 171 lb (77.6 kg)   BMI 28.02 kg/m  Gen: No acute distress. Nontoxic in appearance.   HENT: AT. [place].  MMM.  Eyes:Pupils Equal Round Reactive to light, Extraocular movements intact,  Conjunctiva without redness, discharge or icterus. Chest: Cough or shortness of breath not present.  Skin: no rashes, purpura or petechiae.  Neuro/msk:  Alert. Oriented x3.  Pain with left rotation and left side bending.  Mild improvement with flexion. Psych: Normal affect, dress and demeanor. Normal speech. Normal thought content and judgment.  ASSESSMENT AND PLAN: JOSEFINE FUHR is a 63 y.o. female present for  Strain of lumbar region, initial encounter -Rest, heat application. -Naproxen twice daily with food for 5-7 days. -Flexeril 5 mg nightly scheduled and may take one other time during the day if able and not sedated. -No lifting -Follow-up 2-3 weeks if not resolved, sooner if worsening.   > 15 minutes  spent with patient, > 50% of that time face to face   Howard Pouch, DO 11/24/2018

## 2018-12-04 NOTE — Telephone Encounter (Signed)
Pt sent a mychart message requesting for a refill for Diltiazem 240 mg. Per chart review, pt was seen on 6/9 by Dr. Carlean Purl and medication was taking of med list. It doesn't specify why medication was stopped but will route to Dr. Warren Lacy to clarify if pt is to continue with medication.

## 2018-12-05 ENCOUNTER — Encounter: Payer: BC Managed Care – PPO | Admitting: Internal Medicine

## 2018-12-08 ENCOUNTER — Other Ambulatory Visit: Payer: Self-pay

## 2018-12-08 MED ORDER — DILTIAZEM HCL ER COATED BEADS 240 MG PO CP24: 240.0000 mg | ORAL_CAPSULE | Freq: Every day | ORAL | 3 refills | Status: DC

## 2018-12-10 ENCOUNTER — Encounter: Payer: Self-pay | Admitting: Family Medicine

## 2018-12-11 ENCOUNTER — Other Ambulatory Visit: Payer: Self-pay

## 2018-12-11 ENCOUNTER — Encounter: Payer: Self-pay | Admitting: Family Medicine

## 2018-12-11 ENCOUNTER — Ambulatory Visit (INDEPENDENT_AMBULATORY_CARE_PROVIDER_SITE_OTHER): Payer: BC Managed Care – PPO | Admitting: Family Medicine

## 2018-12-11 VITALS — BP 101/60 | HR 62 | Temp 98.3°F | Resp 18 | Ht 66.0 in | Wt 175.5 lb

## 2018-12-11 DIAGNOSIS — S39012A Strain of muscle, fascia and tendon of lower back, initial encounter: Secondary | ICD-10-CM | POA: Diagnosis not present

## 2018-12-11 MED ORDER — METHYLPREDNISOLONE ACETATE 80 MG/ML IJ SUSP
80.0000 mg | Freq: Once | INTRAMUSCULAR | Status: AC
  Administered 2018-12-11: 80 mg via INTRAMUSCULAR

## 2018-12-11 MED ORDER — PREDNISONE 20 MG PO TABS: ORAL_TABLET | ORAL | 0 refills | Status: DC

## 2018-12-11 MED ORDER — DICLOFENAC SODIUM ER 100 MG PO TB24: 100.0000 mg | ORAL_TABLET | Freq: Every day | ORAL | 1 refills | Status: DC

## 2018-12-11 NOTE — Progress Notes (Signed)
SUBJECTIVE Chief Complaint  Patient presents with  . Back Pain    Left lower back pain has gotten better but is worse now when she is walking. Pt went to [employer] and could hardly make it back to her car becuas the pain is so bad     HPI: [name] [name] is a 63 y.o. female present to discuss back pain.  She was seen by this provider on 11/24/2018 for back pain that started 11/22/2018 after helping lift her husband (recent surgery).  She states it is mildly improved but she still having back pain with rotation and when walking.  States she feels a catch when she goes to transition between sitting and standing.  She states the pain is much better she sits still.  She reports naproxen did calm down her back.  However she did not want to continue taking naproxen every day.  SHe points to the left lower back as the area of discomfort.  Prior note:  Patient reports she has been helping her husband after his knee surgery and she strained her lower back in the process on Sunday.  She reports the pain is in the left lower side of her back.  She denies any radiation of pain.  Walking makes the pain worse.  Sitting or bending forward makes the pain better.  She denies any bladder or bowel dysfunction.  She denies any prior history of back injury or surgery.  SHe has tried using Biofreeze and heat for comfort. -Rest, heat application. -Naproxen twice daily with food for 5-7 days. -Flexeril 5 mg nightly scheduled and may take one other time during the day if able and not sedated. -No lifting ROS: See pertinent positives and negatives per HPI.  Patient Active Problem List   Diagnosis Date Noted  . Iron deficiency anemia 08/07/2018  . Iron malabsorption 08/07/2018  . Epigastric pain 06/26/2018  . Hair thinning 06/08/2018  . Nausea 06/08/2018  . Globus sensation 04/13/2018  . CAD S/P percutaneous coronary angioplasty 11/12/2017  . Chronic pain of right upper extremity 11/10/2017  . Abnormal  cardiac CT angiography 11/04/2017  . Anemia 10/12/2017  . Rosacea 10/22/2016  . Focal muscle atrophy 09/26/2015  . Non-insulin dependent type 2 diabetes mellitus ([place]) 07/26/2015  . Hypomagnesemia 08/10/2014  . Diabetic gastroparesis ([place]) 07/19/2014  . Esophageal reflux 08/25/2013  . Hypokalemia 05/25/2013  . CTS (carpal tunnel syndrome) 01/27/2013  . Hypertension 08/24/2010  . Hyperlipidemia LDL goal <70 05/04/2007  . NONSPEC ELEVATION OF LEVELS OF TRANSAMINASE/LDH 05/01/2007  . Hypothyroidism 04/27/2007  . FATTY LIVER DISEASE 04/27/2007    Social History   Tobacco Use  . Smoking status: Never Smoker  . Smokeless tobacco: Never Used  Substance Use Topics  . Alcohol use: Not Currently    Current Outpatient Medications:  .  ACCU-CHEK FASTCLIX LANCETS MISC, CHECK BLOOD SUGAR ONCE     DAILY, Disp: 102 each, Rfl: 3 .  aspirin 81 MG tablet, Take 81 mg by mouth daily. , Disp: , Rfl:  .  Calcium Carbonate (CALTRATE 600 PO), Take 600 mg by mouth 2 (two) times daily. , Disp: , Rfl:  .  diltiazem (CARDIZEM CD) 240 MG 24 hr capsule, Take 1 capsule (240 mg total) by mouth daily., Disp: 90 capsule, Rfl: 3 .  famotidine (PEPCID) 20 MG tablet, Take 1 tablet (20 mg total) by mouth at bedtime., Disp: 90 tablet, Rfl: 3 .  FARXIGA 10 MG TABS tablet, TAKE 1 TABLET BY  MOUTH EVERY DAY, Disp: 90 tablet, Rfl: 3 .  fenofibrate 160 MG tablet, TAKE 1 TABLET BY MOUTH EVERY DAY, Disp: 90 tablet, Rfl: 0 .  Glucosamine-Chondroitin (OSTEO BI-FLEX REGULAR STRENGTH) 250-200 MG TABS, Take 1 tablet by mouth 2 (two) times daily. , Disp: , Rfl:  .  glucose blood (ACCU-CHEK SMARTVIEW) test strip, Check blood sugars once daily, Disp: 100 each, Rfl: 12 .  Krill Oil 350 MG CAPS, Take 350 mg by mouth daily. , Disp: , Rfl:  .  levothyroxine (SYNTHROID, LEVOTHROID) 88 MCG tablet, Take 88 mcg by mouth See admin instructions. Take 1 tablet daily and 2 tablets on Saturday, Disp: , Rfl:  .  Magnesium 250 MG TABS, Take 1 tablet  by mouth daily., Disp: , Rfl:  .  metFORMIN (GLUCOPHAGE-XR) 500 MG 24 hr tablet, Take 1 tablet (500 mg total) by mouth daily., Disp: 90 tablet, Rfl: 3 .  metoprolol tartrate (LOPRESSOR) 25 MG tablet, Take 1 tablet (25 mg total) by mouth 2 (two) times daily., Disp: 180 tablet, Rfl: 3 .  naproxen (NAPROSYN) 500 MG tablet, Take 1 tablet (500 mg total) by mouth 2 (two) times daily with a meal., Disp: 30 tablet, Rfl: 0 .  nitroGLYCERIN (NITROSTAT) 0.4 MG SL tablet, Place 1 tablet (0.4 mg total) under the tongue every 5 (five) minutes as needed for chest pain., Disp: 50 tablet, Rfl: 3 .  pantoprazole (PROTONIX) 40 MG tablet, Take 1 tablet (40 mg total) by mouth 2 (two) times daily., Disp: 180 tablet, Rfl: 1 .  pioglitazone (ACTOS) 30 MG tablet, TAKE 1 TABLET BY MOUTH EVERY DAY, Disp: 90 tablet, Rfl: 3 .  POTASSIUM CITRATE PO, Take 99 mg by mouth daily. , Disp: , Rfl:  .  Probiotic Product (PROBIOTIC DAILY PO), Take 99 mg by mouth daily. , Disp: , Rfl:  .  repaglinide (PRANDIN) 0.5 MG tablet, TAKE 1 TABLET (0.5 MG TOTAL) BY MOUTH DAILY WITH SUPPER., Disp: 90 tablet, Rfl: 0 .  triamterene-hydrochlorothiazide (MAXZIDE-25) 37.5-25 MG tablet, Take 0.5 tablets by mouth daily., Disp: 45 tablet, Rfl: 1 .  cyclobenzaprine (FLEXERIL) 5 MG tablet, Take 1 tablet (5 mg total) by mouth 3 (three) times daily as needed for muscle spasms. (Patient not taking: Reported on 12/11/2018), Disp: 30 tablet, Rfl: 1 .  Diclofenac Sodium CR 100 MG 24 hr tablet, Take 1 tablet (100 mg total) by mouth daily., Disp: 30 tablet, Rfl: 1 .  predniSONE (DELTASONE) 20 MG tablet, 60 mg x2d, 40 mg x2d, 20 mg x2d, 10 mg x2d, Disp: 13 tablet, Rfl: 0  Allergies  Allergen Reactions  . Epinephrine Other (See Comments)    Pass out   . Repatha [Evolocumab] Nausea And Vomiting  . Statins Other (See Comments)    Myalgias: Simvastatin also caused back ache  . Welchol [Colesevelam Hcl]     myalgia  . Amoxicillin Itching and Rash    Has patient had  a PCN reaction causing immediate rash, facial/tongue/throat swelling, SOB or lightheadedness with hypotension: No Has patient had a PCN reaction causing severe rash involving mucus membranes or skin necrosis: No Has patient had a PCN reaction that required hospitalization: No Has patient had a PCN reaction occurring within the last 10 years: Yes If all of the above answers are "NO", then may proceed with Cephalosporin use.     OBJECTIVE: BP 101/60 (BP Location: Left Arm, Patient Position: Sitting, Cuff Size: Normal)   Pulse 62   Temp 98.3 F (36.8 C) (Temporal)   Resp  18   Ht 5\' 6"  (1.676 m)   Wt 175 lb 8 oz (79.6 kg)   SpO2 96%   BMI 28.33 kg/m  Gen: Afebrile. No acute distress.  HENT: AT. [place].  MMM.  MSK: No erythema, no soft tissue swelling.  No lumbar bony tenderness.  Tender to palpation over left SI.  Comfort with flexion and mild rotation discomfort.  Negative straight leg raises bilaterally.  Positive FABRE for left SI. NV intact distally.  Skin: No rashes, purpura or petechiae.  Neuro: Guarded gait. PERLA. EOMi. Alert. Oriented x3  ASSESSMENT AND PLAN: MALISSA SLAY is a 63 y.o. female present for  Strain of lumbar region, initial encounter -Patient's exam today is consistent with left SI dysfunction. - Rest, heat/ice application. -Voltaren daily -IM Depo-Medrol injection today, start prednisone taper tomorrow. -No lifting -Follow-up 2-4 weeks if not resolved, sooner if worsening.   > 25 minutes spent with patient, >50% of time spent face to face      Howard Pouch, DO 12/14/2018

## 2018-12-11 NOTE — Patient Instructions (Signed)
Steroid shot today.  Start oral steroid taper late tomorrow.  Voltaren pill once a day with food should help with pain.  Watch the sugar while on steroid- they will probably elevate.  No bending or lifting anything over 10 lbs.   If still not improved after 2 weeks will need to consider image. Follow up if not better.     Sacroiliac Joint Dysfunction  Sacroiliac joint dysfunction is a condition that causes inflammation on one or both sides of the sacroiliac (SI) joint. The SI joint connects the lower part of the spine (sacrum) with the two upper portions of the pelvis (ilium). This condition causes deep aching or burning pain in the low back. In some cases, the pain may also spread into one or both buttocks, hips, or thighs. What are the causes? This condition may be caused by:  Pregnancy. During pregnancy, extra stress is put on the SI joints because the pelvis widens.  Injury, such as: ? Injuries from car accidents. ? Sports-related injuries. ? Work-related injuries.  Having one leg that is shorter than the other.  Conditions that affect the joints, such as: ? Rheumatoid arthritis. ? Gout. ? Psoriatic arthritis. ? Joint infection (septic arthritis). Sometimes, the cause of SI joint dysfunction is not known. What are the signs or symptoms? Symptoms of this condition include:  Aching or burning pain in the lower back. The pain may also spread to other areas, such as: ? Buttocks. ? Groin. ? Thighs.  Muscle spasms in or around the painful areas.  Increased pain when standing, walking, running, stair climbing, bending, or lifting. How is this diagnosed? This condition is diagnosed with a physical exam and medical history. During the exam, the health care provider may move one or both of your legs to different positions to check for pain. Various tests may be done to confirm the diagnosis, including:  Imaging tests to look for other causes of pain. These may include: ? MRI.  ? CT scan. ? Bone scan.  Diagnostic injection. A numbing medicine is injected into the SI joint using a needle. If your pain is temporarily improved or stopped after the injection, this can indicate that SI joint dysfunction is the problem. How is this treated? Treatment depends on the cause and severity of your condition. Treatment options may include:  Ice or heat applied to the lower back area after an injury. This may help reduce pain and muscle spasms.  Medicines to relieve pain or inflammation or to relax the muscles.  Wearing a back brace (sacroiliac brace) to help support the joint while your back is healing.  Physical therapy to increase muscle strength around the joint and flexibility at the joint. This may also involve learning proper body positions and ways of moving to relieve stress on the joint.  Direct manipulation of the SI joint.  Injections of steroid medicine into the joint to reduce pain and swelling.  Radiofrequency ablation to burn away nerves that are carrying pain messages from the joint.  Use of a device that provides electrical stimulation to help reduce pain at the joint.  Surgery to put in screws and plates that limit or prevent joint motion. This is rare. Follow these instructions at home: Medicines  Take over-the-counter and prescription medicines only as told by your health care provider.  Do not drive or use heavy machinery while taking prescription pain medicine.  If you are taking prescription pain medicine, take actions to prevent or treat constipation. Your health care  provider may recommend that you: ? Drink enough fluid to keep your urine pale yellow. ? Eat foods that are high in fiber, such as fresh fruits and vegetables, whole grains, and beans. ? Limit foods that are high in fat and processed sugars, such as fried or sweet foods. ? Take an over-the-counter or prescription medicine for constipation. If you have a brace:  Wear the brace  as told by your health care provider. Remove it only as told by your health care provider.  Keep the brace clean.  If the brace is not waterproof: ? Do not let it get wet. ? Cover it with a watertight covering when you take a bath or a shower. Managing pain, stiffness, and swelling      Icing can help with pain and swelling. Heat may help with muscle tension or spasms. Ask your health care provider if you should use ice or heat.  If directed, put ice on the affected area: ? If you have a removable brace, remove it as told by your health care provider. ? Put ice in a plastic bag. ? Place a towel between your skin and the bag. ? Leave the ice on for 20 minutes, 2-3 times a day.  If directed, apply heat to the affected area. Use the heat source that your health care provider recommends, such as a moist heat pack or a heating pad. ? Place a towel between your skin and the heat source. ? Leave the heat on for 20-30 minutes. ? Remove the heat if your skin turns bright red. This is especially important if you are unable to feel pain, heat, or cold. You may have a greater risk of getting burned. General instructions  Rest as needed. Ask your health care provider what activities are safe for you.  Return to your normal activities as told by your health care provider.  Exercise as directed by your health care provider or physical therapist.  Do not use any products that contain nicotine or tobacco, such as cigarettes and e-cigarettes. These can delay bone healing. If you need help quitting, ask your health care provider.  Keep all follow-up visits as told by your health care provider. This is important. Contact a health care provider if:  Your pain is not controlled with medicine.  You have a fever.  Your pain is getting worse. Get help right away if:  You have weakness, numbness, or tingling in your legs or feet.  You lose control of your bladder or bowel. Summary  Sacroiliac  joint dysfunction is a condition that causes inflammation on one or both sides of the sacroiliac (SI) joint.  This condition causes deep aching or burning pain in the low back. In some cases, the pain may also spread into one or both buttocks, hips, or thighs.  Treatment depends on the cause and severity of your condition. It may include medicines to reduce pain and swelling or to relax muscles. This information is not intended to replace advice given to you by your health care provider. Make sure you discuss any questions you have with your health care provider. Document Released: 08/09/2008 Document Revised: 01/07/2018 Document Reviewed: 06/23/2017 Elsevier Patient Education  2020 [employer].

## 2018-12-14 ENCOUNTER — Encounter: Payer: Self-pay | Admitting: Family Medicine

## 2018-12-20 ENCOUNTER — Other Ambulatory Visit: Payer: Self-pay | Admitting: Endocrinology

## 2018-12-23 ENCOUNTER — Encounter: Payer: Self-pay | Admitting: Family Medicine

## 2018-12-23 DIAGNOSIS — I10 Essential (primary) hypertension: Secondary | ICD-10-CM

## 2018-12-23 MED ORDER — TRIAMTERENE-HCTZ 37.5-25 MG PO TABS: 0.5000 | ORAL_TABLET | Freq: Every day | ORAL | 1 refills | Status: DC

## 2018-12-23 NOTE — Telephone Encounter (Signed)
Refilled med. Please make pt aware.

## 2018-12-23 NOTE — Telephone Encounter (Signed)
RF request for Maxzide 37.5-25mg  LOV: 07/017/2020 Next ov: Not scheduled  Last written: 06/08/2018 #45 x1   Please advise

## 2018-12-24 ENCOUNTER — Other Ambulatory Visit: Payer: Self-pay

## 2018-12-24 MED ORDER — NA SULFATE-K SULFATE-MG SULF 17.5-3.13-1.6 GM/177ML PO SOLN: ORAL | 0 refills | Status: DC

## 2018-12-31 ENCOUNTER — Telehealth: Payer: Self-pay | Admitting: Gastroenterology

## 2018-12-31 ENCOUNTER — Telehealth: Payer: Self-pay | Admitting: Internal Medicine

## 2018-12-31 NOTE — Telephone Encounter (Signed)
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
## 2019-01-01 ENCOUNTER — Encounter: Payer: BC Managed Care – PPO | Admitting: Internal Medicine

## 2019-01-01 NOTE — Telephone Encounter (Signed)
Left message for patient to call back  

## 2019-01-05 NOTE — Telephone Encounter (Signed)
I left a detailed message asking the patient to call back and reschedule procedure

## 2019-01-19 ENCOUNTER — Other Ambulatory Visit: Payer: Self-pay | Admitting: Family Medicine

## 2019-01-19 DIAGNOSIS — E039 Hypothyroidism, unspecified: Secondary | ICD-10-CM

## 2019-01-19 NOTE — Telephone Encounter (Signed)
Sent to wrong office °

## 2019-01-19 NOTE — Telephone Encounter (Signed)
Refilled levothyroxine for her

## 2019-01-28 ENCOUNTER — Other Ambulatory Visit: Payer: Self-pay

## 2019-02-02 ENCOUNTER — Ambulatory Visit (INDEPENDENT_AMBULATORY_CARE_PROVIDER_SITE_OTHER): Payer: BC Managed Care – PPO | Admitting: Endocrinology

## 2019-02-02 ENCOUNTER — Telehealth: Payer: Self-pay | Admitting: Endocrinology

## 2019-02-02 ENCOUNTER — Other Ambulatory Visit: Payer: Self-pay

## 2019-02-02 ENCOUNTER — Encounter: Payer: Self-pay | Admitting: Endocrinology

## 2019-02-02 ENCOUNTER — Other Ambulatory Visit: Payer: Self-pay | Admitting: Gastroenterology

## 2019-02-02 VITALS — BP 120/60 | HR 62 | Ht 66.0 in | Wt 175.0 lb

## 2019-02-02 DIAGNOSIS — E1143 Type 2 diabetes mellitus with diabetic autonomic (poly)neuropathy: Secondary | ICD-10-CM | POA: Diagnosis not present

## 2019-02-02 DIAGNOSIS — K219 Gastro-esophageal reflux disease without esophagitis: Secondary | ICD-10-CM

## 2019-02-02 LAB — POCT GLYCOSYLATED HEMOGLOBIN (HGB A1C): Hemoglobin A1C: 7.4 % — AB (ref 4.0–5.6)

## 2019-02-02 MED ORDER — SITAGLIPTIN PHOSPHATE 100 MG PO TABS: 100.0000 mg | ORAL_TABLET | Freq: Every day | ORAL | 3 refills | Status: DC

## 2019-02-02 MED ORDER — REPAGLINIDE 1 MG PO TABS: 1.0000 mg | ORAL_TABLET | Freq: Three times a day (TID) | ORAL | 11 refills | Status: DC

## 2019-02-02 NOTE — Progress Notes (Signed)
Subjective:    Patient ID: [name] [name], female    DOB: 13-Feb-1956, 63 y.o.   MRN: [id number]  HPI Pt returns for f/u of diabetes mellitus: DM type: 2 Dx'ed: 123456 Complications: gastroparesis and renal insuff Therapy: 4 oral meds.   GDM: never DKA: never.  Severe hypoglycemia: never.  Pancreatitis: never.  Other: she has never been on insulin; diarrhea and renal insuff limits metformin dosage; she did not tolerate bromocriptine (vomiting), or victoza (nausea); edema limits pioglitazone dosage.   Interval history: 2 mos ago, pt took prednisone for back pain.  She takes DM meds as rx'ed.   Meter is downloaded today, and the printout is scanned into the record.  All are checked fasting.  It varies from 130-165.   Past Medical History:  Diagnosis Date  . Accelerating angina ([place]) 11/04/2017  . Asthma   . Chronic pain disorder    neck and shoulder  . CTS (carpal tunnel syndrome) 01/27/2013   right  . Diabetes mellitus type II   . Diabetic gastroparesis ([place])   . Esophageal reflux 08/25/2013   [facility], Dr Barth Kirks  . FATTY LIVER DISEASE 04/27/2007   Qualifier: Diagnosis of  By: Danelle Earthly CMA, Darlene    . Fundic gland polyps of stomach, benign   . HTN (hypertension)   . Hyperlipidemia, mixed 05/04/2007   Qualifier: Diagnosis of  By: Wynona Luna crestor caused myalgias even at low dose Livalo caused myalgias Lipitor  Mother with severe reaction myalgias Simvastatin, Welchol   . Hypertension 08/24/2010  . Hypokalemia 05/25/2013   Improved stopping HCTZ. Was noted to have an elevated glucose when K was low. Recheck renal next week after starting Maxzide  . Hypomagnesemia 08/10/2014  . Hypothyroidism   . Iron deficiency anemia 08/07/2018  . Iron malabsorption 08/07/2018  . Laryngitis 08/25/2017  . Leg cramps 04/15/2016  . Nausea without vomiting 08/08/2014  . NONSPEC ELEVATION OF LEVELS OF TRANSAMINASE/LDH 05/01/2007   Qualifier: Diagnosis of  By: Garner Gavel    .  Osteoarthritis    chronic, right knee (11/04/2017)  . OVARIAN CYST 07/11/2009   Qualifier: Diagnosis of  By: Wynona Luna   . Ovarian cyst   . Plantar fasciitis   . Rosacea 10/22/2016  . Urine incontinence     Past Surgical History:  Procedure Laterality Date  . AUGMENTATION MAMMAPLASTY Bilateral 2002  . BLADDER SUSPENSION  1981  . COLONOSCOPY    . CORONARY STENT INTERVENTION N/A 11/04/2017   Procedure: CORONARY STENT INTERVENTION;  Surgeon: Nelva Bush, MD;  Location: [facility];  Service: Cardiovascular;  Laterality: N/A;  . ESOPHAGOGASTRODUODENOSCOPY    . INTRAVASCULAR PRESSURE WIRE/FFR STUDY N/A 11/04/2017   Procedure: INTRAVASCULAR PRESSURE WIRE/FFR STUDY;  Surgeon: Nelva Bush, MD;  Location: [facility];  Service: Cardiovascular;  Laterality: N/A;  . INTRAVASCULAR ULTRASOUND/IVUS N/A 11/04/2017   Procedure: Intravascular Ultrasound/IVUS;  Surgeon: Nelva Bush, MD;  Location: [facility];  Service: Cardiovascular;  Laterality: N/A;  . KNEE ARTHROSCOPY  1998, [id number]  . LAPAROSCOPIC CHOLECYSTECTOMY  2007  . LEFT HEART CATH AND CORONARY ANGIOGRAPHY N/A 11/04/2017   Procedure: LEFT HEART CATH AND CORONARY ANGIOGRAPHY;  Surgeon: Nelva Bush, MD;  Location: [facility];  Service: Cardiovascular;  Laterality: N/A;  . LEFT HEART CATHETERIZATION WITH CORONARY ANGIOGRAM N/A 11/21/2011   Procedure: LEFT HEART CATHETERIZATION WITH CORONARY ANGIOGRAM;  Surgeon: Josue Hector, MD;  Location: [facility] C[facility];  Service: Cardiovascular;  Laterality:  N/A;  . TONSILLECTOMY  1975  . VAGINAL HYSTERECTOMY  1983   "still have my ovaries"    Social History   Socioeconomic History  . Marital status: Married    Spouse name: Not on file  . Number of children: 1  . Years of education: Not on file  . Highest education level: Not on file  Occupational History  . Occupation: [employer]  . Financial resource strain: Not  on file  . Food insecurity    Worry: Not on file    Inability: Not on file  . Transportation needs    Medical: Not on file    Non-medical: Not on file  Tobacco Use  . Smoking status: Never Smoker  . Smokeless tobacco: Never Used  Substance and Sexual Activity  . Alcohol use: Not Currently  . Drug use: Never  . Sexual activity: Not Currently    Partners: Male  Lifestyle  . Physical activity    Days per week: Not on file    Minutes per session: Not on file  . Stress: Not on file  Relationships  . Social Herbalist on phone: Not on file    Gets together: Not on file    Attends religious service: Not on file    Active member of club or organization: Not on file    Attends meetings of clubs or organizations: Not on file    Relationship status: Not on file  . Intimate partner violence    Fear of current or ex partner: Not on file    Emotionally abused: Not on file    Physically abused: Not on file    Forced sexual activity: Not on file  Other Topics Concern  . Not on file  Social History Narrative   Marital status/children/pets: Married, 1 child.    Education/employment: HS   Safety:      -smoke alarm in the home:Yes     - wears seatbelt: Yes     - Feels safe in their relationships: Yes    Current Outpatient Medications on File Prior to Visit  Medication Sig Dispense Refill  . ACCU-CHEK FASTCLIX LANCETS MISC CHECK BLOOD SUGAR ONCE     DAILY 102 each 3  . aspirin 81 MG tablet Take 81 mg by mouth daily.     . Calcium Carbonate (CALTRATE 600 PO) Take 600 mg by mouth 2 (two) times daily.     . cyclobenzaprine (FLEXERIL) 5 MG tablet Take 1 tablet (5 mg total) by mouth 3 (three) times daily as needed for muscle spasms. 30 tablet 1  . Diclofenac Sodium CR 100 MG 24 hr tablet Take 1 tablet (100 mg total) by mouth daily. 30 tablet 1  . diltiazem (CARDIZEM CD) 240 MG 24 hr capsule Take 1 capsule (240 mg total) by mouth daily. 90 capsule 3  . famotidine (PEPCID) 20 MG  tablet Take 1 tablet (20 mg total) by mouth at bedtime. 90 tablet 3  . FARXIGA 10 MG TABS tablet TAKE 1 TABLET BY MOUTH EVERY DAY 90 tablet 3  . fenofibrate 160 MG tablet TAKE 1 TABLET BY MOUTH EVERY DAY 90 tablet 0  . Glucosamine-Chondroitin (OSTEO BI-FLEX REGULAR STRENGTH) 250-200 MG TABS Take 1 tablet by mouth 2 (two) times daily.     Marland Kitchen glucose blood (ACCU-CHEK SMARTVIEW) test strip Check blood sugars once daily 100 each 12  . Krill Oil 350 MG CAPS Take 350 mg by mouth daily.     Marland Kitchen  levothyroxine (SYNTHROID) 88 MCG tablet Take one tablet daily on an empty stomach, 2 tabs on Saturday. 94 tablet 1  . Magnesium 250 MG TABS Take 1 tablet by mouth daily.    . metFORMIN (GLUCOPHAGE-XR) 500 MG 24 hr tablet Take 1 tablet (500 mg total) by mouth daily. 90 tablet 3  . metoprolol tartrate (LOPRESSOR) 25 MG tablet Take 1 tablet (25 mg total) by mouth 2 (two) times daily. 180 tablet 3  . Na Sulfate-K Sulfate-Mg Sulf 17.5-3.13-1.6 GM/177ML SOLN Take as directed 354 mL 0  . naproxen (NAPROSYN) 500 MG tablet Take 1 tablet (500 mg total) by mouth 2 (two) times daily with a meal. 30 tablet 0  . nitroGLYCERIN (NITROSTAT) 0.4 MG SL tablet Place 1 tablet (0.4 mg total) under the tongue every 5 (five) minutes as needed for chest pain. 50 tablet 3  . pantoprazole (PROTONIX) 40 MG tablet Take 1 tablet (40 mg total) by mouth 2 (two) times daily. 180 tablet 1  . pioglitazone (ACTOS) 30 MG tablet TAKE 1 TABLET BY MOUTH EVERY DAY 90 tablet 0  . POTASSIUM CITRATE PO Take 99 mg by mouth daily.     . Probiotic Product (PROBIOTIC DAILY PO) Take 99 mg by mouth daily.     Marland Kitchen triamterene-hydrochlorothiazide (MAXZIDE-25) 37.5-25 MG tablet Take 0.5 tablets by mouth daily. 45 tablet 1   No current facility-administered medications on file prior to visit.     Allergies  Allergen Reactions  . Epinephrine Other (See Comments)    Pass out   . Repatha [Evolocumab] Nausea And Vomiting  . Statins Other (See Comments)    Myalgias:  Simvastatin also caused back ache  . Welchol [Colesevelam Hcl]     myalgia  . Amoxicillin Itching and Rash    Has patient had a PCN reaction causing immediate rash, facial/tongue/throat swelling, SOB or lightheadedness with hypotension: No Has patient had a PCN reaction causing severe rash involving mucus membranes or skin necrosis: No Has patient had a PCN reaction that required hospitalization: No Has patient had a PCN reaction occurring within the last 10 years: Yes If all of the above answers are "NO", then may proceed with Cephalosporin use.     Family History  Problem Relation Age of Onset  . Alzheimer's disease Mother   . Diabetes type II Mother   . Hiatal hernia Mother   . Diabetes Mother   . Emphysema Father   . COPD Father   . Depression Sister        suicide  . Diabetes Sister   . Diabetes Daughter   . Colon cancer Neg Hx   . Stomach cancer Neg Hx     BP 120/60 (BP Location: Left Arm, Patient Position: Sitting, Cuff Size: Normal)   Pulse 62   Ht 5\' 6"  (1.676 m)   Wt 175 lb (79.4 kg)   SpO2 97%   BMI 28.25 kg/m   Review of Systems Denies n/v/d.      Objective:   Physical Exam VITAL SIGNS:  See vs page GENERAL: no distress Pulses: dorsalis pedis intact bilat.   MSK: no deformity of the feet CV: no leg edema.   Skin:  no ulcer on the feet.  normal color and temp on the feet.  Neuro: sensation is intact to touch on the feet.     Lab Results  Component Value Date   HGBA1C 7.4 (A) 02/02/2019   Lab Results  Component Value Date   CREATININE 1.12 (H) 09/18/2018  BUN 16 09/18/2018   NA 140 09/18/2018   K 4.2 09/18/2018   CL 101 09/18/2018   CO2 26 09/18/2018       Assessment & Plan:  Type 2 DM, with renal insuff: worse. Gastroparesis, This limits rx options.  We'll try Januvia prior to GLP med. Back pain: prednisone could increase a1c.   Patient Instructions  check your blood sugar once a day.  vary the time of day when you check, between  before the 3 meals, and at bedtime.  also check if you have symptoms of your blood sugar being too high or too low.  please keep a record of the readings and bring it to your next appointment here.  You can write it on any piece of paper.  please call us sooner if your blood sugar goes below 70, or if you have a lot of readings over 200.   I have sent a prescription to your pharmacy, to add "Januvia." Please continue the same other diabetes medications.  Please come back for a follow-up appointment in 2-3 months.

## 2019-02-02 NOTE — Patient Instructions (Addendum)
check your blood sugar once a day.  vary the time of day when you check, between before the 3 meals, and at bedtime.  also check if you have symptoms of your blood sugar being too high or too low.  please keep a record of the readings and bring it to your next appointment here.  You can write it on any piece of paper.  please call us sooner if your blood sugar goes below 70, or if you have a lot of readings over 200.   I have sent a prescription to your pharmacy, to add "Januvia." Please continue the same other diabetes medications.  Please come back for a follow-up appointment in 2-3 months.

## 2019-03-15 ENCOUNTER — Ambulatory Visit (INDEPENDENT_AMBULATORY_CARE_PROVIDER_SITE_OTHER): Payer: BC Managed Care – PPO

## 2019-03-15 ENCOUNTER — Other Ambulatory Visit: Payer: Self-pay

## 2019-03-15 DIAGNOSIS — Z23 Encounter for immunization: Secondary | ICD-10-CM

## 2019-03-16 ENCOUNTER — Other Ambulatory Visit: Payer: Self-pay | Admitting: Endocrinology

## 2019-04-10 ENCOUNTER — Other Ambulatory Visit: Payer: Self-pay | Admitting: Endocrinology

## 2019-04-26 ENCOUNTER — Other Ambulatory Visit: Payer: Self-pay

## 2019-04-27 ENCOUNTER — Ambulatory Visit: Payer: BC Managed Care – PPO | Admitting: Endocrinology

## 2019-04-27 VITALS — BP 120/74 | HR 66

## 2019-04-27 DIAGNOSIS — E119 Type 2 diabetes mellitus without complications: Secondary | ICD-10-CM

## 2019-04-27 DIAGNOSIS — E1143 Type 2 diabetes mellitus with diabetic autonomic (poly)neuropathy: Secondary | ICD-10-CM

## 2019-04-27 LAB — POCT GLYCOSYLATED HEMOGLOBIN (HGB A1C): Hemoglobin A1C: 6.9 % — AB (ref 4.0–5.6)

## 2019-04-27 MED ORDER — SITAGLIPTIN PHOSPHATE 50 MG PO TABS: 50.0000 mg | ORAL_TABLET | Freq: Every day | ORAL | 3 refills | Status: DC

## 2019-04-27 NOTE — Progress Notes (Signed)
Subjective:    Patient ID: [name] [name], female    DOB: Nov 08, 1955, 63 y.o.   MRN: [id number]  HPI Pt returns for f/u of diabetes mellitus: DM type: 2 Dx'ed: 123456 Complications: gastroparesis and renal insuff Therapy: 5 oral meds.   GDM: never DKA: never.  Severe hypoglycemia: never.  Pancreatitis: never.  Other: she has never been on insulin; diarrhea and renal insuff limits metformin dosage; she did not tolerate bromocriptine (vomiting), or victoza (nausea); edema limits pioglitazone dosage.   Interval history: no recent steroids. She takes DM meds as rx'ed.  she brings her meter with her cbg's which I have reviewed today. All are in the 100's.  She has nausea since on Januvia.   Past Medical History:  Diagnosis Date  . Accelerating angina ([place]) 11/04/2017  . Asthma   . Chronic pain disorder    neck and shoulder  . CTS (carpal tunnel syndrome) 01/27/2013   right  . Diabetes mellitus type II   . Diabetic gastroparesis ([place])   . Esophageal reflux 08/25/2013   [facility], Dr Barth Kirks  . FATTY LIVER DISEASE 04/27/2007   Qualifier: Diagnosis of  By: Danelle Earthly CMA, Darlene    . Fundic gland polyps of stomach, benign   . HTN (hypertension)   . Hyperlipidemia, mixed 05/04/2007   Qualifier: Diagnosis of  By: Wynona Luna crestor caused myalgias even at low dose Livalo caused myalgias Lipitor  Mother with severe reaction myalgias Simvastatin, Welchol   . Hypertension 08/24/2010  . Hypokalemia 05/25/2013   Improved stopping HCTZ. Was noted to have an elevated glucose when K was low. Recheck renal next week after starting Maxzide  . Hypomagnesemia 08/10/2014  . Hypothyroidism   . Iron deficiency anemia 08/07/2018  . Iron malabsorption 08/07/2018  . Laryngitis 08/25/2017  . Leg cramps 04/15/2016  . Nausea without vomiting 08/08/2014  . NONSPEC ELEVATION OF LEVELS OF TRANSAMINASE/LDH 05/01/2007   Qualifier: Diagnosis of  By: Garner Gavel    . Osteoarthritis    chronic, right knee  (11/04/2017)  . OVARIAN CYST 07/11/2009   Qualifier: Diagnosis of  By: Wynona Luna   . Ovarian cyst   . Plantar fasciitis   . Rosacea 10/22/2016  . Urine incontinence     Past Surgical History:  Procedure Laterality Date  . AUGMENTATION MAMMAPLASTY Bilateral 2002  . BLADDER SUSPENSION  1981  . COLONOSCOPY    . CORONARY STENT INTERVENTION N/A 11/04/2017   Procedure: CORONARY STENT INTERVENTION;  Surgeon: Nelva Bush, MD;  Location: [place] [facility];  Service: Cardiovascular;  Laterality: N/A;  . ESOPHAGOGASTRODUODENOSCOPY    . INTRAVASCULAR PRESSURE WIRE/FFR STUDY N/A 11/04/2017   Procedure: INTRAVASCULAR PRESSURE WIRE/FFR STUDY;  Surgeon: Nelva Bush, MD;  Location: [facility];  Service: Cardiovascular;  Laterality: N/A;  . INTRAVASCULAR ULTRASOUND/IVUS N/A 11/04/2017   Procedure: Intravascular Ultrasound/IVUS;  Surgeon: Nelva Bush, MD;  Location: [facility];  Service: Cardiovascular;  Laterality: N/A;  . KNEE ARTHROSCOPY  1998, [id number]  . LAPAROSCOPIC CHOLECYSTECTOMY  2007  . LEFT HEART CATH AND CORONARY ANGIOGRAPHY N/A 11/04/2017   Procedure: LEFT HEART CATH AND CORONARY ANGIOGRAPHY;  Surgeon: Nelva Bush, MD;  Location: [facility];  Service: Cardiovascular;  Laterality: N/A;  . LEFT HEART CATHETERIZATION WITH CORONARY ANGIOGRAM N/A 11/21/2011   Procedure: LEFT HEART CATHETERIZATION WITH CORONARY ANGIOGRAM;  Surgeon: Josue Hector, MD;  Location: [facility];  Service: Cardiovascular;  Laterality: N/A;  . TONSILLECTOMY  1975  .  VAGINAL HYSTERECTOMY  1983   "still have my ovaries"    Social History   Socioeconomic History  . Marital status: Married    Spouse name: Not on file  . Number of children: 1  . Years of education: Not on file  . Highest education level: Not on file  Occupational History  . Occupation: [employer]  . Financial resource strain: Not on file  . Food insecurity    Worry:  Not on file    Inability: Not on file  . Transportation needs    Medical: Not on file    Non-medical: Not on file  Tobacco Use  . Smoking status: Never Smoker  . Smokeless tobacco: Never Used  Substance and Sexual Activity  . Alcohol use: Not Currently  . Drug use: Never  . Sexual activity: Not Currently    Partners: Male  Lifestyle  . Physical activity    Days per week: Not on file    Minutes per session: Not on file  . Stress: Not on file  Relationships  . Social Herbalist on phone: Not on file    Gets together: Not on file    Attends religious service: Not on file    Active member of club or organization: Not on file    Attends meetings of clubs or organizations: Not on file    Relationship status: Not on file  . Intimate partner violence    Fear of current or ex partner: Not on file    Emotionally abused: Not on file    Physically abused: Not on file    Forced sexual activity: Not on file  Other Topics Concern  . Not on file  Social History Narrative   Marital status/children/pets: Married, 1 child.    Education/employment: HS   Safety:      -smoke alarm in the home:Yes     - wears seatbelt: Yes     - Feels safe in their relationships: Yes    Current Outpatient Medications on File Prior to Visit  Medication Sig Dispense Refill  . ACCU-CHEK FASTCLIX LANCETS MISC CHECK BLOOD SUGAR ONCE     DAILY 102 each 3  . aspirin 81 MG tablet Take 81 mg by mouth daily.     . Calcium Carbonate (CALTRATE 600 PO) Take 600 mg by mouth 2 (two) times daily.     . cyclobenzaprine (FLEXERIL) 5 MG tablet Take 1 tablet (5 mg total) by mouth 3 (three) times daily as needed for muscle spasms. 30 tablet 1  . Diclofenac Sodium CR 100 MG 24 hr tablet Take 1 tablet (100 mg total) by mouth daily. 30 tablet 1  . diltiazem (CARDIZEM CD) 240 MG 24 hr capsule Take 1 capsule (240 mg total) by mouth daily. 90 capsule 3  . famotidine (PEPCID) 20 MG tablet Take 1 tablet (20 mg total) by  mouth at bedtime. 90 tablet 3  . FARXIGA 10 MG TABS tablet TAKE 1 TABLET BY MOUTH EVERY DAY 90 tablet 3  . fenofibrate 160 MG tablet TAKE 1 TABLET BY MOUTH EVERY DAY 90 tablet 0  . Glucosamine-Chondroitin (OSTEO BI-FLEX REGULAR STRENGTH) 250-200 MG TABS Take 1 tablet by mouth 2 (two) times daily.     Marland Kitchen glucose blood (ACCU-CHEK SMARTVIEW) test strip Check blood sugars once daily 100 each 12  . Krill Oil 350 MG CAPS Take 350 mg by mouth daily.     Marland Kitchen levothyroxine (SYNTHROID) 88 MCG  tablet Take one tablet daily on an empty stomach, 2 tabs on Saturday. 94 tablet 1  . Magnesium 250 MG TABS Take 1 tablet by mouth daily.    . metFORMIN (GLUCOPHAGE-XR) 500 MG 24 hr tablet Take 1 tablet (500 mg total) by mouth daily. 90 tablet 3  . metoprolol tartrate (LOPRESSOR) 25 MG tablet Take 1 tablet (25 mg total) by mouth 2 (two) times daily. 180 tablet 3  . Na Sulfate-K Sulfate-Mg Sulf 17.5-3.13-1.6 GM/177ML SOLN Take as directed 354 mL 0  . naproxen (NAPROSYN) 500 MG tablet Take 1 tablet (500 mg total) by mouth 2 (two) times daily with a meal. 30 tablet 0  . nitroGLYCERIN (NITROSTAT) 0.4 MG SL tablet Place 1 tablet (0.4 mg total) under the tongue every 5 (five) minutes as needed for chest pain. 50 tablet 3  . pantoprazole (PROTONIX) 40 MG tablet TAKE 1 TABLET TWICE A DAY 180 tablet 1  . pioglitazone (ACTOS) 30 MG tablet TAKE 1 TABLET BY MOUTH EVERY DAY 90 tablet 0  . POTASSIUM CITRATE PO Take 99 mg by mouth daily.     . Probiotic Product (PROBIOTIC DAILY PO) Take 99 mg by mouth daily.     . repaglinide (PRANDIN) 1 MG tablet Take 1 tablet (1 mg total) by mouth 3 (three) times daily before meals. 90 tablet 11  . triamterene-hydrochlorothiazide (MAXZIDE-25) 37.5-25 MG tablet Take 0.5 tablets by mouth daily. 45 tablet 1   No current facility-administered medications on file prior to visit.     Allergies  Allergen Reactions  . Epinephrine Other (See Comments)    Pass out   . Repatha [Evolocumab] Nausea And  Vomiting  . Statins Other (See Comments)    Myalgias: Simvastatin also caused back ache  . Welchol [Colesevelam Hcl]     myalgia  . Amoxicillin Itching and Rash    Has patient had a PCN reaction causing immediate rash, facial/tongue/throat swelling, SOB or lightheadedness with hypotension: No Has patient had a PCN reaction causing severe rash involving mucus membranes or skin necrosis: No Has patient had a PCN reaction that required hospitalization: No Has patient had a PCN reaction occurring within the last 10 years: Yes If all of the above answers are "NO", then may proceed with Cephalosporin use.     Family History  Problem Relation Age of Onset  . Alzheimer's disease Mother   . Diabetes type II Mother   . Hiatal hernia Mother   . Diabetes Mother   . Emphysema Father   . COPD Father   . Depression Sister        suicide  . Diabetes Sister   . Diabetes Daughter   . Colon cancer Neg Hx   . Stomach cancer Neg Hx     BP 120/74   Pulse 66    Review of Systems Denies sob.      Objective:   Physical Exam VITAL SIGNS:  See vs page.  VS are incomplete due to lack of staff.   GENERAL: no distress Pulses: dorsalis pedis intact bilat.   MSK: no deformity of the feet CV: trace bilat leg edema Skin:  no ulcer on the feet.  normal color and temp on the feet. Neuro: sensation is intact to touch on the feet.    Lab Results  Component Value Date   CREATININE 1.12 (H) 09/18/2018   BUN 16 09/18/2018   NA 140 09/18/2018   K 4.2 09/18/2018   CL 101 09/18/2018   CO2 26 09/18/2018  Lab Results  Component Value Date   HGBA1C 6.9 (A) 04/27/2019      Assessment & Plan:  Type 2 DM, with gastroparesis: well-controlled.  Nausea: new Edema: This limits rx options  Patient Instructions  check your blood sugar once a day.  vary the time of day when you check, between before the 3 meals, and at bedtime.  also check if you have symptoms of your blood sugar being too high or too  low.  please keep a record of the readings and bring it to your next appointment here.  You can write it on any piece of paper.  please call us sooner if your blood sugar goes below 70, or if you have a lot of readings over 200.   I have sent a prescription to your pharmacy, to reduce theJanuvia. Please continue the same other diabetes medications.  Please come back for a follow-up appointment in 3 months.

## 2019-04-27 NOTE — Patient Instructions (Addendum)
check your blood sugar once a day.  vary the time of day when you check, between before the 3 meals, and at bedtime.  also check if you have symptoms of your blood sugar being too high or too low.  please keep a record of the readings and bring it to your next appointment here.  You can write it on any piece of paper.  please call us sooner if your blood sugar goes below 70, or if you have a lot of readings over 200.   I have sent a prescription to your pharmacy, to reduce theJanuvia. Please continue the same other diabetes medications.  Please come back for a follow-up appointment in 3 months.

## 2019-05-02 ENCOUNTER — Other Ambulatory Visit: Payer: Self-pay | Admitting: Gastroenterology

## 2019-05-02 DIAGNOSIS — K219 Gastro-esophageal reflux disease without esophagitis: Secondary | ICD-10-CM

## 2019-05-10 ENCOUNTER — Other Ambulatory Visit: Payer: Self-pay | Admitting: Internal Medicine

## 2019-05-10 DIAGNOSIS — K219 Gastro-esophageal reflux disease without esophagitis: Secondary | ICD-10-CM

## 2019-05-18 ENCOUNTER — Telehealth: Payer: Self-pay

## 2019-05-18 NOTE — Telephone Encounter (Signed)
Patient would like to know what her blood type is.

## 2019-05-18 NOTE — Telephone Encounter (Signed)
Pt was called and told we do not routinely check blood types. Pt was advised if she has had any major surgery, children, or donated blood that they would have this information. She verbalized understanding

## 2019-06-03 ENCOUNTER — Other Ambulatory Visit: Payer: Self-pay

## 2019-06-03 ENCOUNTER — Encounter: Payer: BC Managed Care – PPO | Admitting: *Deleted

## 2019-06-03 DIAGNOSIS — Z006 Encounter for examination for normal comparison and control in clinical research program: Secondary | ICD-10-CM

## 2019-06-03 NOTE — Research (Signed)
Screened patient for the [place] study but was not able to meet inclusion, cardiovascular event

## 2019-06-15 ENCOUNTER — Ambulatory Visit (INDEPENDENT_AMBULATORY_CARE_PROVIDER_SITE_OTHER): Payer: BC Managed Care – PPO | Admitting: Family Medicine

## 2019-06-15 ENCOUNTER — Encounter: Payer: Self-pay | Admitting: Family Medicine

## 2019-06-15 ENCOUNTER — Other Ambulatory Visit: Payer: Self-pay

## 2019-06-15 VITALS — BP 105/63 | HR 65 | Temp 97.3°F | Resp 16 | Ht 66.0 in

## 2019-06-15 DIAGNOSIS — Z5181 Encounter for therapeutic drug level monitoring: Secondary | ICD-10-CM | POA: Diagnosis not present

## 2019-06-15 DIAGNOSIS — Z79899 Other long term (current) drug therapy: Secondary | ICD-10-CM | POA: Diagnosis not present

## 2019-06-15 DIAGNOSIS — D509 Iron deficiency anemia, unspecified: Secondary | ICD-10-CM

## 2019-06-15 DIAGNOSIS — E039 Hypothyroidism, unspecified: Secondary | ICD-10-CM

## 2019-06-15 DIAGNOSIS — I83813 Varicose veins of bilateral lower extremities with pain: Secondary | ICD-10-CM

## 2019-06-15 DIAGNOSIS — I10 Essential (primary) hypertension: Secondary | ICD-10-CM | POA: Diagnosis not present

## 2019-06-15 DIAGNOSIS — E876 Hypokalemia: Secondary | ICD-10-CM

## 2019-06-15 DIAGNOSIS — K760 Fatty (change of) liver, not elsewhere classified: Secondary | ICD-10-CM

## 2019-06-15 DIAGNOSIS — Z9861 Coronary angioplasty status: Secondary | ICD-10-CM

## 2019-06-15 DIAGNOSIS — E785 Hyperlipidemia, unspecified: Secondary | ICD-10-CM | POA: Diagnosis not present

## 2019-06-15 DIAGNOSIS — E119 Type 2 diabetes mellitus without complications: Secondary | ICD-10-CM

## 2019-06-15 DIAGNOSIS — I2510 Atherosclerotic heart disease of native coronary artery without angina pectoris: Secondary | ICD-10-CM | POA: Diagnosis not present

## 2019-06-15 LAB — COMPREHENSIVE METABOLIC PANEL
ALT: 26 U/L (ref 0–35)
AST: 23 U/L (ref 0–37)
Albumin: 4.3 g/dL (ref 3.5–5.2)
Alkaline Phosphatase: 83 U/L (ref 39–117)
BUN: 21 mg/dL (ref 6–23)
CO2: 28 mEq/L (ref 19–32)
Calcium: 9.8 mg/dL (ref 8.4–10.5)
Chloride: 99 mEq/L (ref 96–112)
Creatinine, Ser: 1.03 mg/dL (ref 0.40–1.20)
GFR: 53.83 mL/min — ABNORMAL LOW (ref >60.00)
Glucose, Bld: 235 mg/dL — ABNORMAL HIGH (ref 70–99)
Potassium: 4.3 mEq/L (ref 3.5–5.1)
Sodium: 137 mEq/L (ref 135–145)
Total Bilirubin: 0.6 mg/dL (ref 0.2–1.2)
Total Protein: 7 g/dL (ref 6.0–8.3)

## 2019-06-15 LAB — CBC WITH DIFFERENTIAL/PLATELET
Basophils Absolute: 0.1 10*3/uL (ref 0.0–0.1)
Basophils Relative: 1.5 % (ref 0.0–3.0)
Eosinophils Absolute: 0.2 10*3/uL (ref 0.0–0.7)
Eosinophils Relative: 2.3 % (ref 0.0–5.0)
HCT: 42.5 % (ref 36.0–46.0)
Hemoglobin: 13.9 g/dL (ref 12.0–15.0)
Lymphocytes Relative: 21.6 % (ref 12.0–46.0)
Lymphs Abs: 1.7 10*3/uL (ref 0.7–4.0)
MCHC: 32.8 g/dL (ref 30.0–36.0)
MCV: 87.9 fl (ref 78.0–100.0)
Monocytes Absolute: 0.6 10*3/uL (ref 0.1–1.0)
Monocytes Relative: 7.7 % (ref 3.0–12.0)
Neutro Abs: 5.2 10*3/uL (ref 1.4–7.7)
Neutrophils Relative %: 66.9 % (ref 43.0–77.0)
Platelets: 278 10*3/uL (ref 150.0–400.0)
RBC: 4.84 Mil/uL (ref 3.87–5.11)
RDW: 14.9 % (ref 11.5–15.5)
WBC: 7.7 10*3/uL (ref 4.0–10.5)

## 2019-06-15 LAB — MAGNESIUM: Magnesium: 1.5 mg/dL (ref 1.5–2.5)

## 2019-06-15 LAB — VITAMIN D 25 HYDROXY (VIT D DEFICIENCY, FRACTURES): VITD: 27.64 ng/mL — ABNORMAL LOW (ref 30.00–100.00)

## 2019-06-15 LAB — T4, FREE: Free T4: 1.12 ng/dL (ref 0.60–1.60)

## 2019-06-15 LAB — VITAMIN B12: Vitamin B-12: 301 pg/mL (ref 211–911)

## 2019-06-15 LAB — TSH: TSH: 1.19 u[IU]/mL (ref 0.35–4.50)

## 2019-06-15 MED ORDER — METOPROLOL TARTRATE 25 MG PO TABS: 25.0000 mg | ORAL_TABLET | Freq: Two times a day (BID) | ORAL | 1 refills | Status: DC

## 2019-06-15 MED ORDER — FENOFIBRATE 160 MG PO TABS: 160.0000 mg | ORAL_TABLET | Freq: Every day | ORAL | 3 refills | Status: DC

## 2019-06-15 MED ORDER — TRIAMTERENE-HCTZ 37.5-25 MG PO TABS: 0.5000 | ORAL_TABLET | Freq: Every day | ORAL | 1 refills | Status: DC

## 2019-06-15 MED ORDER — LEVOTHYROXINE SODIUM 88 MCG PO TABS: ORAL_TABLET | ORAL | 3 refills | Status: DC

## 2019-06-15 NOTE — Patient Instructions (Signed)
We are committed to keeping you informed about the COVID-19 vaccine.  As the vaccine continues to become available for each phase, we will ensure that patients who meet the criteria receive the information they need to access vaccination opportunities. Continue to check your MyChart account and [email or web address] for updates. Please review the Phase 1b information below.  Following [place]'s guidelines for the distribution of COVID-19 vaccines we are pleased to share our plans to begin offering vaccines to those 65 and older (Phase 1b). Here are details of those plans:  [name] [name] On Tuesday, Jan. 19, the [facility]) and [place] begin large-scale COVID-19 vaccinations at the [facility]. The vaccinations are appointment only and for those 53 and older.  Walk-ins will not be accepted.  All appointments are currently filled. Please join our waiting list for the next available appointments. We will contact you when appointments become available. Please do not sign up more than once.  Join Our Waiting List   Other Vaccination Opportunities in [place] We are also working in partnership with county health agencies in our service counties to ensure continuing vaccination availability in the weeks and months ahead. Learn more about each county's vaccination efforts in the website links below:   [place]'s phase 1b vaccination guidelines, prioritizing those 65 and over as the next eligible group to receive the COVID-19 vaccine, are detailed at [email or web address].[email or web address].   Vaccine Safety and Effectiveness Clinical trials for the Pfizer COVID-19 vaccine involved 42,000 people and showed that the vaccine is more than 95% effective in preventing COVID-19 with no serious safety concerns. Similar results have  been reported for the Moderna COVID-19 vaccine. Side effects reported in the [place] clinical trials include a sore arm at the injection site, fatigue, headache, chills and fever. While side effects from the [place] COVID-19 vaccine are higher than for a typical flu vaccine, they are lower in many ways than side effects from the leading vaccine to prevent shingles. Side effects are signs that a vaccine is working and are related to your immune system being stimulated to produce antibodies against infection. Side effects from vaccination are far less significant than health impacts from COVID-19.  Staying Informed Pharmacists, infectious disease doctors, critical care nurses and other experts at [facility] continue to speak publicly through media interviews and direct communication with our patients and communities about the safety, effectiveness and importance of vaccines to eliminate COVID-19. In addition, reliable information on vaccine safety, effectiveness, side effects and more is available on the following websites:  [place] Department of Health and Human Services COVID-19 Vaccine Information Website.  U.S. Centers for Disease Control and Prevention XX123456 Human resources officer.  Staying Safe We agree with the CDC on what we can do to help our communities get back to normal: Getting "back to normal" is going to take all of our tools. If we use all the tools we have, we stand the best chance of getting our families, communities, schools and workplaces "back to normal" sooner:  Get vaccinated as soon as vaccines become available within the phase of the state's vaccination rollout plan for which you meet the eligibility criteria.  Wear a mask.  Stay 6 feet from others and avoid crowds.  Wash hands often.  For our most current information, please visit [email or web address].   COVID vaccines are starting for the  community.  1. Health Department: Appointments are  required .Once your age bracket is called (watch news and visit website below for info) appt can be made by calling [phone] and selecting option 2.  Walk-ins will not be accepted.                  Clinic locations are: [facility], [place]; [facility] [address] [place]; . [place] at [facility], [address], [id number], [employer]. Visit [email or web address] and click on the "XX123456 Vaccine Info" rectangle for more information about vaccinations.  2. [place] web page will also have up to date info on vaccinations offered through [place] at  [email or web address] - Appt also required.  - vaccines are administered at the [employer] ([facility]). . If website indicates you are eligible for vaccine- please call [phone]   3. [facility]  -This will be done according to the rollout plan published by the [place] ([place]). The first public phase, also known as "Phase 1(b)", is for individuals 72 and older. Location: [facility] (Health and Conservation officer, historic buildings) [place] [address]; [place] [address] -  All citizens receiving a vaccine must complete a COVID-19 consent form. To lessen your wait time, these can be found and completed ahead of time at [email or web address] or you may pick up a paper copy at the [facility] [phone] in [place]) or at [employer]. - Future dates and locations will be widely shared through the [place] Division of Public Health's XX123456 Hotline at [phone], media outlets in the [place] and through the [employer] ([email or web address] and [email or web address]). Citizens can also contact  the [place] at [phone] for more information   Participants are asked to wear a face covering at vaccination sites.

## 2019-06-15 NOTE — Progress Notes (Signed)
This visit occurred during the SARS-CoV-2 public health emergency.  Safety protocols were in place, including screening questions prior to the visit, additional usage of staff PPE, and extensive cleaning of exam room while observing appropriate contact time as indicated for disinfecting solutions.    [name] [name] , Apr 21, 1956, 64 y.o., female MRN: [id number] Patient Care Team    Relationship Specialty Notifications Start End  Ma Hillock, DO PCP - General Family Medicine  06/03/18   Minus Breeding, MD PCP - Cardiology Cardiology Admissions 11/14/17   Gatha Mayer, MD Consulting Physician Gastroenterology  06/05/18   Marshell Garfinkel, MD Consulting Physician Pulmonary Disease  06/05/18   Renato Shin, MD Consulting Physician Endocrinology  06/05/18     Chief Complaint  Patient presents with  . Hypertension    Pt is here for HTN check. She does not check BP at home. Takes BP med in the AM at [place]. My eye doctor- Jule Ser      Subjective: SHONTAY WALLNER is a 64 y.o. Pt presents for [facility] CAD S/P percutaneous coronary angioplasty/Hyperlipidemia LDL goal <70/Essential hypertension Pt reports compliance with Maxide, metoprolol, Cardizem, fenofibrate. Blood pressures ranges at home within normal limits. Patient denies chest pain, shortness of breath or lower extremity edema. Pt does take a daily baby ASA. Pt is unable to tolerate statins.  Per cardiology note she also cannot take PCSK9 inhibitor secondary to side effect.  She was unable to tolerate WelChol. Labs updated today. RF: Hypertension, hyperlipidemia, diabetes, obesity, CAD She has a history of cardiac cath 11/04/2017 showed 60% LAD, FFR was significant and she underwent PCI with DES.  She was treated with Brilinta which was discontinued June 2020. Hypothyroidism Patient reports compliance with levothyroxine 88 mcg daily x6 days a week and 176 mcg 1 day a week.  Non-insulin dependent type 2 diabetes mellitus  ([place]) Condition is managed by endocrinology.  GERD/ Encounter for monitoring long-term proton pump inhibitor therapy Patient is prescribed Protonix and Pepcid by gastroenterology.  Iron deficiency anemia, unspecified iron deficiency anemia type/Hypokalemia/Hypomagnesemia Patient had a history of iron deficiency and elevated ferritin.  She currently does not take daily iron.  Varicose veins of both lower extremities with pain Patient reports she has noticed more discomfort on her right lower extremity surrounding her ankle and her shin.  She endorses having varicose veins over this area that have become rather tender.   Depression screen [facility] 2/9 06/03/2018 09/06/2014  Decreased Interest 2 0  Down, Depressed, Hopeless 1 0  PHQ - 2 Score 3 0  Altered sleeping 0 -  Tired, decreased energy 2 -  Change in appetite 3 -  Feeling bad or failure about yourself  0 -  Trouble concentrating 0 -  Moving slowly or fidgety/restless 0 -  Suicidal thoughts 0 -  PHQ-9 Score 8 -  Some recent data might be hidden    Allergies  Allergen Reactions  . Epinephrine Other (See Comments)    Pass out   . Repatha [Evolocumab] Nausea And Vomiting  . Statins Other (See Comments)    Myalgias: Simvastatin also caused back ache  . Welchol [Colesevelam Hcl]     myalgia  . Amoxicillin Itching and Rash    Has patient had a PCN reaction causing immediate rash, facial/tongue/throat swelling, SOB or lightheadedness with hypotension: No Has patient had a PCN reaction causing severe rash involving mucus membranes or skin necrosis: No Has patient had a PCN reaction that required hospitalization: No Has patient  had a PCN reaction occurring within the last 10 years: Yes If all of the above answers are "NO", then may proceed with Cephalosporin use.    Social History   Social History Narrative   Marital status/children/pets: Married, 1 child.    Education/employment: HS   Safety:      -smoke alarm in the home:Yes      - wears seatbelt: Yes     - Feels safe in their relationships: Yes   Past Medical History:  Diagnosis Date  . Accelerating angina ([place]) 11/04/2017  . Asthma   . Chronic pain disorder    neck and shoulder  . CTS (carpal tunnel syndrome) 01/27/2013   right  . Diabetes mellitus type II   . Diabetic gastroparesis ([place])   . Esophageal reflux 08/25/2013   [place], Dr Barth Kirks  . FATTY LIVER DISEASE 04/27/2007   Qualifier: Diagnosis of  By: Danelle Earthly CMA, Darlene    . Focal muscle atrophy 09/26/2015  . Fundic gland polyps of stomach, benign   . HTN (hypertension)   . Hyperlipidemia, mixed 05/04/2007   Qualifier: Diagnosis of  By: Wynona Luna crestor caused myalgias even at low dose Livalo caused myalgias Lipitor  Mother with severe reaction myalgias Simvastatin, Welchol   . Hypertension 08/24/2010  . Hypokalemia 05/25/2013   Improved stopping HCTZ. Was noted to have an elevated glucose when K was low. Recheck renal next week after starting Maxzide  . Hypomagnesemia 08/10/2014  . Hypothyroidism   . Iron deficiency anemia 08/07/2018  . Iron malabsorption 08/07/2018  . Laryngitis 08/25/2017  . Leg cramps 04/15/2016  . Nausea without vomiting 08/08/2014  . NONSPEC ELEVATION OF LEVELS OF TRANSAMINASE/LDH 05/01/2007   Qualifier: Diagnosis of  By: Garner Gavel    . Osteoarthritis    chronic, right knee (11/04/2017)  . OVARIAN CYST 07/11/2009   Qualifier: Diagnosis of  By: Wynona Luna   . Ovarian cyst   . Plantar fasciitis   . Rosacea 10/22/2016  . Urine incontinence    Past Surgical History:  Procedure Laterality Date  . AUGMENTATION MAMMAPLASTY Bilateral 2002  . BLADDER SUSPENSION  1981  . COLONOSCOPY    . CORONARY STENT INTERVENTION N/A 11/04/2017   Procedure: CORONARY STENT INTERVENTION;  Surgeon: Nelva Bush, MD;  Location: [facility];  Service: Cardiovascular;  Laterality: N/A;  . ESOPHAGOGASTRODUODENOSCOPY    . INTRAVASCULAR PRESSURE WIRE/FFR STUDY N/A  11/04/2017   Procedure: INTRAVASCULAR PRESSURE WIRE/FFR STUDY;  Surgeon: Nelva Bush, MD;  Location: [facility];  Service: Cardiovascular;  Laterality: N/A;  . INTRAVASCULAR ULTRASOUND/IVUS N/A 11/04/2017   Procedure: Intravascular Ultrasound/IVUS;  Surgeon: Nelva Bush, MD;  Location: [facility];  Service: Cardiovascular;  Laterality: N/A;  . KNEE ARTHROSCOPY  1998, [id number]  . LAPAROSCOPIC CHOLECYSTECTOMY  2007  . LEFT HEART CATH AND CORONARY ANGIOGRAPHY N/A 11/04/2017   Procedure: LEFT HEART CATH AND CORONARY ANGIOGRAPHY;  Surgeon: Nelva Bush, MD;  Location: [facility];  Service: Cardiovascular;  Laterality: N/A;  . LEFT HEART CATHETERIZATION WITH CORONARY ANGIOGRAM N/A 11/21/2011   Procedure: LEFT HEART CATHETERIZATION WITH CORONARY ANGIOGRAM;  Surgeon: Josue Hector, MD;  Location: [facility] CATH LAB;  Service: Cardiovascular;  Laterality: N/A;  . TONSILLECTOMY  1975  . VAGINAL HYSTERECTOMY  1983   "still have my ovaries"   Family History  Problem Relation Age of Onset  . Alzheimer's disease Mother   . Diabetes type II Mother   . Hiatal hernia  Mother   . Diabetes Mother   . Emphysema Father   . COPD Father   . Depression Sister        suicide  . Diabetes Sister   . Diabetes Daughter   . Colon cancer Neg Hx   . Stomach cancer Neg Hx    Allergies as of 06/15/2019      Reactions   Epinephrine Other (See Comments)   Pass out    Repatha [evolocumab] Nausea And Vomiting   Statins Other (See Comments)   Myalgias: Simvastatin also caused back ache   Welchol [colesevelam Hcl]    myalgia   Amoxicillin Itching, Rash   Has patient had a PCN reaction causing immediate rash, facial/tongue/throat swelling, SOB or lightheadedness with hypotension: No Has patient had a PCN reaction causing severe rash involving mucus membranes or skin necrosis: No Has patient had a PCN reaction that required hospitalization: No Has patient had a PCN reaction occurring  within the last 10 years: Yes If all of the above answers are "NO", then may proceed with Cephalosporin use.      Medication List       Accurate as of June 15, 2019 11:59 PM. If you have any questions, ask your nurse or doctor.        STOP taking these medications   cyclobenzaprine 5 MG tablet Commonly known as: FLEXERIL Stopped by: Howard Pouch, DO   naproxen 500 MG tablet Commonly known as: Naprosyn Stopped by: Howard Pouch, DO     TAKE these medications   Accu-Chek FastClix Lancets Misc CHECK BLOOD SUGAR ONCE     DAILY   aspirin 81 MG tablet Take 81 mg by mouth daily.   CALTRATE 600 PO Take 600 mg by mouth 2 (two) times daily.   Diclofenac Sodium CR 100 MG 24 hr tablet Take 1 tablet (100 mg total) by mouth daily.   diltiazem 240 MG 24 hr capsule Commonly known as: CARDIZEM CD Take 1 capsule (240 mg total) by mouth daily.   famotidine 20 MG tablet Commonly known as: PEPCID TAKE 1 TABLET AT BEDTIME   Farxiga 10 MG Tabs tablet Generic drug: dapagliflozin propanediol TAKE 1 TABLET BY MOUTH EVERY DAY   fenofibrate 160 MG tablet Take 1 tablet (160 mg total) by mouth daily.   glucose blood test strip Commonly known as: Accu-Chek SmartView Check blood sugars once daily   Krill Oil 350 MG Caps Take 350 mg by mouth daily.   levothyroxine 88 MCG tablet Commonly known as: SYNTHROID Take one tablet daily on an empty stomach, 2 tabs on Saturday.   Magnesium 250 MG Tabs Take 1 tablet by mouth daily.   metFORMIN 500 MG 24 hr tablet Commonly known as: GLUCOPHAGE-XR Take 1 tablet (500 mg total) by mouth daily.   metoprolol tartrate 25 MG tablet Commonly known as: LOPRESSOR Take 1 tablet (25 mg total) by mouth 2 (two) times daily.   Na Sulfate-K Sulfate-Mg Sulf 17.5-3.13-1.6 GM/177ML Soln Take as directed   nitroGLYCERIN 0.4 MG SL tablet Commonly known as: NITROSTAT Place 1 tablet (0.4 mg total) under the tongue every 5 (five) minutes as needed for chest  pain.   Osteo Bi-Flex Regular Strength 250-200 MG Tabs Generic drug: Glucosamine-Chondroitin Take 1 tablet by mouth 2 (two) times daily.   pantoprazole 40 MG tablet Commonly known as: PROTONIX TAKE 1 TABLET TWICE A DAY   pioglitazone 30 MG tablet Commonly known as: ACTOS TAKE 1 TABLET BY MOUTH EVERY DAY   POTASSIUM CITRATE  PO Take 99 mg by mouth daily.   PROBIOTIC DAILY PO Take 99 mg by mouth daily.   repaglinide 1 MG tablet Commonly known as: PRANDIN Take 1 tablet (1 mg total) by mouth 3 (three) times daily before meals.   sitaGLIPtin 50 MG tablet Commonly known as: Januvia Take 1 tablet (50 mg total) by mouth daily.   triamterene-hydrochlorothiazide 37.5-25 MG tablet Commonly known as: MAXZIDE-25 Take 0.5 tablets by mouth daily.       All past medical history, surgical history, allergies, family history, immunizations andmedications were updated in the EMR today and reviewed under the history and medication portions of their EMR.     ROS: Negative, with the exception of above mentioned in HPI   Objective:  BP 105/63 (BP Location: Left Arm, Patient Position: Sitting, Cuff Size: Normal)   Pulse 65   Temp (!) 97.3 F (36.3 C) (Temporal)   Resp 16   Ht 5' 6"  (1.676 m)   SpO2 98%   BMI 28.25 kg/m  Body mass index is 28.25 kg/m. Gen: Afebrile. No acute distress. Nontoxic in appearance, well developed, well nourished.  Very pleasant, mildly overweight Caucasian female. HENT: AT. [place].  Eyes:Pupils Equal Round Reactive to light, Extraocular movements intact,  Conjunctiva without redness, discharge or icterus. Neck/lymp/endocrine: Supple, no lymphadenopathy, no thyromegaly CV: RRR no murmurs, no edema Chest: CTAB, no wheeze or crackles. Good air movement, normal resp effort.  Abd: Soft. NTND. BS present.  Skin/MSK: Tender to palpation along right lower anterior leg.  Multiple varicosities present over this location and surrounding ankle/foot.  No purpura or  petechiae.  No erythema.  No rash.   Neuro:  Normal gait. PERLA. EOMi. Alert. Oriented x3  Psych: Normal affect, dress and demeanor. Normal speech. Normal thought content and judgment.  No exam data present No results found. No results found for this or any previous visit (from the past 24 hour(s)).  Assessment/Plan: MADESYN AST is a 64 y.o. female present for OV for  CAD S/P percutaneous coronary angioplasty/Hyperlipidemia LDL goal <70/Essential hypertension -Stable.  Continue Maxide and metoprolol. Continue fenofibrate. Continue baby aspirin. Low-sodium diet and routine exercise. - Comp Met (CMET) -Follow-up 6 months.  Hypothyroidism Patient reports compliance with levothyroxine 88 mcg x 6 days a week and x2 tabs on 1 day a week - TSH - T4, free  Non-insulin dependent type 2 diabetes mellitus ([place]) Condition is managed by endocrinology.  All diabetes medications are prescribed by endocrinology. Current meds: Januvia 50 mg daily, Prandin 1 mg 3 times daily.  Actos 30 mg daily.  Metformin 500 mg daily.  Farxiga 10 mg daily. Reviewed last A1c 6.9. Foot exam appears completed 07/30/2018. Eye exam 02/25/2019 Urine microalbumin 06/03/2018 Pneumococcal polysaccharide 03/28/2008> restart series after 64  GERD/ Encounter for monitoring long-term proton pump inhibitor therapy -Stable.  Protonix and Pepcid prescribed by gastroenterologist. - Magnesium - Vitamin D (25 hydroxy) - B12  Iron deficiency anemia, unspecified iron deficiency anemia type/Hypokalemia/Hypomagnesemia - CBC w/Diff - Iron, TIBC and Ferritin Panel - magnesium  Varicose veins of both lower extremities with pain - compression stockings recommended.  Patient elected to buy over-the-counter product. - Ambulatory referral to Vascular Surgery     Reviewed expectations re: course of current medical issues.  Discussed self-management of symptoms.  Outlined signs and symptoms indicating need for more acute  intervention.  Patient verbalized understanding and all questions were answered.  Patient received an After-Visit Summary.    Orders Placed This Encounter  Procedures  .  TSH  . T4, free  . Magnesium  . Comp Met (CMET)  . CBC w/Diff  . Iron, TIBC and Ferritin Panel  . Vitamin D (25 hydroxy)  . B12  . Ambulatory referral to Vascular Surgery   Meds ordered this encounter  Medications  . metoprolol tartrate (LOPRESSOR) 25 MG tablet    Sig: Take 1 tablet (25 mg total) by mouth 2 (two) times daily.    Dispense:  180 tablet    Refill:  1  . fenofibrate 160 MG tablet    Sig: Take 1 tablet (160 mg total) by mouth daily.    Dispense:  90 tablet    Refill:  3  . triamterene-hydrochlorothiazide (MAXZIDE-25) 37.5-25 MG tablet    Sig: Take 0.5 tablets by mouth daily.    Dispense:  45 tablet    Refill:  1  . levothyroxine (SYNTHROID) 88 MCG tablet    Sig: Take one tablet daily on an empty stomach, 2 tabs on Saturday.    Dispense:  94 tablet    Refill:  3    Referral Orders     Ambulatory referral to Vascular Surgery    Note is dictated utilizing voice recognition software. Although note has been proof read prior to signing, occasional typographical errors still can be missed. If any questions arise, please do not hesitate to call for verification.   electronically signed by:  Howard Pouch, DO  [place]

## 2019-06-16 ENCOUNTER — Telehealth: Payer: Self-pay | Admitting: Family Medicine

## 2019-06-16 LAB — IRON,TIBC AND FERRITIN PANEL
%SAT: 25 % (calc) (ref 16–45)
Ferritin: 209 ng/mL (ref 16–288)
Iron: 90 ug/dL (ref 45–160)
TIBC: 360 mcg/dL (calc) (ref 250–450)

## 2019-06-16 NOTE — Telephone Encounter (Signed)
Pt was called and VM was left to return call  °

## 2019-06-16 NOTE — Telephone Encounter (Signed)
Please inform patient the following information: Her iron levels, blood cell counts, thyroid level and liver function are all within normal range. Her kidney function is stable. Her vitamin D is just mildly low at 27> I do not see a vitamin D supplementation listed on her med list unless she has vitamin D added in her calcium.  If she is on no vitamin D I would encourage her to start 800 units OTC vitamin D daily.  If she is on vitamin D already I would encourage her to increase her dose by [phone] units daily. Her B12 is on the lower end of normal at 301 and she could benefit from an over-the-counter 500 mcg of vitamin B-12 daily.  Some people notice increase in energy levels with higher normal levels of B12. Her magnesium is at the lowest end of normal at 1.5.  If she can tolerate higher magnesium dose supplement without having loose stools-she should increase her dose.  Per her med list it shows she is taking 250 mg of magnesium daily.  She may want to try taking 1-1/2 tabs daily.  Please have her schedule a follow-up for her [place] in approximately 5-1/2 months before needing refills.

## 2019-06-16 NOTE — Telephone Encounter (Signed)
Pt was called and given lab results, she verbalized understanding. She asked for my chart message to be sent to patient with information as she was driving. This was sent. Pt was scheduled for [facility] appt

## 2019-06-23 ENCOUNTER — Encounter: Payer: Self-pay | Admitting: Endocrinology

## 2019-06-24 ENCOUNTER — Other Ambulatory Visit: Payer: Self-pay

## 2019-06-24 DIAGNOSIS — E1143 Type 2 diabetes mellitus with diabetic autonomic (poly)neuropathy: Secondary | ICD-10-CM

## 2019-06-24 MED ORDER — ACCU-CHEK SMARTVIEW VI STRP: 1.0000 | ORAL_STRIP | Freq: Every day | 0 refills | Status: DC

## 2019-06-24 MED ORDER — ACCU-CHEK FASTCLIX LANCETS MISC: 1.0000 | Freq: Every day | 0 refills | Status: DC

## 2019-07-26 ENCOUNTER — Other Ambulatory Visit: Payer: Self-pay | Admitting: Endocrinology

## 2019-07-28 ENCOUNTER — Other Ambulatory Visit: Payer: Self-pay

## 2019-07-30 ENCOUNTER — Encounter: Payer: Self-pay | Admitting: Endocrinology

## 2019-07-30 ENCOUNTER — Ambulatory Visit: Payer: BC Managed Care – PPO | Admitting: Endocrinology

## 2019-07-30 ENCOUNTER — Other Ambulatory Visit: Payer: Self-pay

## 2019-07-30 VITALS — BP 120/72 | HR 70 | Ht 66.0 in | Wt 180.6 lb

## 2019-07-30 DIAGNOSIS — E1143 Type 2 diabetes mellitus with diabetic autonomic (poly)neuropathy: Secondary | ICD-10-CM | POA: Diagnosis not present

## 2019-07-30 DIAGNOSIS — E119 Type 2 diabetes mellitus without complications: Secondary | ICD-10-CM

## 2019-07-30 LAB — POCT GLYCOSYLATED HEMOGLOBIN (HGB A1C): Hemoglobin A1C: 7.1 % — AB (ref 4.0–5.6)

## 2019-07-30 MED ORDER — RYBELSUS 3 MG PO TABS: 3.0000 mg | ORAL_TABLET | Freq: Every day | ORAL | 3 refills | Status: DC

## 2019-07-30 MED ORDER — CONTOUR BLOOD GLUCOSE SYSTEM W/DEVICE KIT: 1.0000 | PACK | Freq: Once | 0 refills | Status: AC

## 2019-07-30 MED ORDER — CONTOUR NEXT TEST VI STRP: 1.0000 | ORAL_STRIP | Freq: Every day | 12 refills | Status: DC

## 2019-07-30 NOTE — Patient Instructions (Signed)
I have sent a prescription to your pharmacy, to change Januvia to Rybelsus.   Please continue the same other medications.   check your blood sugar once a day.  vary the time of day when you check, between before the 3 meals, and at bedtime.  also check if you have symptoms of your blood sugar being too high or too low.  please keep a record of the readings and bring it to your next appointment here (or you can bring the meter itself).  You can write it on any piece of paper.  please call us sooner if your blood sugar goes below 70, or if you have a lot of readings over 200.   Please come back for a follow-up appointment in 3 months.   

## 2019-07-30 NOTE — Progress Notes (Signed)
Subjective:    Patient ID: [name] [name], female    DOB: 05/03/1956, 64 y.o.   MRN: [id number]  HPI Pt returns for f/u of diabetes mellitus: DM type: 2 Dx'ed: 123456 Complications: gastroparesis, CAD, and renal insuff.   Therapy: 5 oral meds.   GDM: never DKA: never.  Severe hypoglycemia: never.  Pancreatitis: never.  Other: she has never been on insulin; diarrhea and renal insuff limit metformin dosage; she did not tolerate bromocriptine (vomiting) or victoza (nausea); edema limits pioglitazone dosage.   Interval history: no recent steroids. She takes DM meds as rx'ed.  she says cbg's are well-controlled.  nausea is resolved.  She does not like her new meter.   Past Medical History:  Diagnosis Date  . Accelerating angina ([place]) 11/04/2017  . Asthma   . Chronic pain disorder    neck and shoulder  . CTS (carpal tunnel syndrome) 01/27/2013   right  . Diabetes mellitus type II   . Diabetic gastroparesis ([place])   . Esophageal reflux 08/25/2013   [facility] [place], Dr Barth Kirks  . FATTY LIVER DISEASE 04/27/2007   Qualifier: Diagnosis of  By: Danelle Earthly CMA, Darlene    . Focal muscle atrophy 09/26/2015  . Fundic gland polyps of stomach, benign   . HTN (hypertension)   . Hyperlipidemia, mixed 05/04/2007   Qualifier: Diagnosis of  By: Wynona Luna crestor caused myalgias even at low dose Livalo caused myalgias Lipitor  Mother with severe reaction myalgias Simvastatin, Welchol   . Hypertension 08/24/2010  . Hypokalemia 05/25/2013   Improved stopping HCTZ. Was noted to have an elevated glucose when K was low. Recheck renal next week after starting Maxzide  . Hypomagnesemia 08/10/2014  . Hypothyroidism   . Iron deficiency anemia 08/07/2018  . Iron malabsorption 08/07/2018  . Laryngitis 08/25/2017  . Leg cramps 04/15/2016  . Nausea without vomiting 08/08/2014  . NONSPEC ELEVATION OF LEVELS OF TRANSAMINASE/LDH 05/01/2007   Qualifier: Diagnosis of  By: Garner Gavel    . Osteoarthritis    chronic,  right knee (11/04/2017)  . OVARIAN CYST 07/11/2009   Qualifier: Diagnosis of  By: Wynona Luna   . Ovarian cyst   . Plantar fasciitis   . Rosacea 10/22/2016  . Urine incontinence     Past Surgical History:  Procedure Laterality Date  . AUGMENTATION MAMMAPLASTY Bilateral 2002  . BLADDER SUSPENSION  1981  . COLONOSCOPY    . CORONARY STENT INTERVENTION N/A 11/04/2017   Procedure: CORONARY STENT INTERVENTION;  Surgeon: Nelva Bush, MD;  Location: [facility];  Service: Cardiovascular;  Laterality: N/A;  . ESOPHAGOGASTRODUODENOSCOPY    . INTRAVASCULAR PRESSURE WIRE/FFR STUDY N/A 11/04/2017   Procedure: INTRAVASCULAR PRESSURE WIRE/FFR STUDY;  Surgeon: Nelva Bush, MD;  Location: [facility];  Service: Cardiovascular;  Laterality: N/A;  . INTRAVASCULAR ULTRASOUND/IVUS N/A 11/04/2017   Procedure: Intravascular Ultrasound/IVUS;  Surgeon: Nelva Bush, MD;  Location: [facility];  Service: Cardiovascular;  Laterality: N/A;  . KNEE ARTHROSCOPY  1998, [id number]  . LAPAROSCOPIC CHOLECYSTECTOMY  2007  . LEFT HEART CATH AND CORONARY ANGIOGRAPHY N/A 11/04/2017   Procedure: LEFT HEART CATH AND CORONARY ANGIOGRAPHY;  Surgeon: Nelva Bush, MD;  Location: [facility];  Service: Cardiovascular;  Laterality: N/A;  . LEFT HEART CATHETERIZATION WITH CORONARY ANGIOGRAM N/A 11/21/2011   Procedure: LEFT HEART CATHETERIZATION WITH CORONARY ANGIOGRAM;  Surgeon: Josue Hector, MD;  Location: [facility];  Service: Cardiovascular;  Laterality: N/A;  . TONSILLECTOMY  [place]   "still have my ovaries"    Social History   Socioeconomic History  . Marital status: Married    Spouse name: Not on file  . Number of children: 1  . Years of education: Not on file  . Highest education level: Not on file  Occupational History  . Occupation: [employer]  Tobacco Use  . Smoking status: Never Smoker  . Smokeless tobacco: Never  Used  Substance and Sexual Activity  . Alcohol use: Not Currently  . Drug use: Never  . Sexual activity: Not Currently    Partners: Male  Other Topics Concern  . Not on file  Social History Narrative   Marital status/children/pets: Married, 1 child.    Education/employment: HS   Safety:      -smoke alarm in the home:Yes     - wears seatbelt: Yes     - Feels safe in their relationships: Yes   Social Determinants of Health   Financial Resource Strain:   . Difficulty of Paying Living Expenses: Not on file  Food Insecurity:   . Worried About Charity fundraiser in the Last Year: Not on file  . Ran Out of Food in the Last Year: Not on file  Transportation Needs:   . Lack of Transportation (Medical): Not on file  . Lack of Transportation (Non-Medical): Not on file  Physical Activity:   . Days of Exercise per Week: Not on file  . Minutes of Exercise per Session: Not on file  Stress:   . Feeling of Stress : Not on file  Social Connections:   . Frequency of Communication with Friends and Family: Not on file  . Frequency of Social Gatherings with Friends and Family: Not on file  . Attends Religious Services: Not on file  . Active Member of Clubs or Organizations: Not on file  . Attends Archivist Meetings: Not on file  . Marital Status: Not on file  Intimate Partner Violence:   . Fear of Current or Ex-Partner: Not on file  . Emotionally Abused: Not on file  . Physically Abused: Not on file  . Sexually Abused: Not on file    Current Outpatient Medications on File Prior to Visit  Medication Sig Dispense Refill  . Accu-Chek FastClix Lancets MISC 1 each by Other route daily. E11.9 102 each 0  . aspirin 81 MG tablet Take 81 mg by mouth daily.     . Calcium Carbonate (CALTRATE 600 PO) Take 600 mg by mouth 2 (two) times daily.     . Diclofenac Sodium CR 100 MG 24 hr tablet Take 1 tablet (100 mg total) by mouth daily. 30 tablet 1  . famotidine (PEPCID) 20 MG tablet TAKE 1  TABLET AT BEDTIME 90 tablet 3  . FARXIGA 10 MG TABS tablet TAKE 1 TABLET BY MOUTH EVERY DAY 90 tablet 3  . fenofibrate 160 MG tablet Take 1 tablet (160 mg total) by mouth daily. 90 tablet 3  . Glucosamine-Chondroitin (OSTEO BI-FLEX REGULAR STRENGTH) 250-200 MG TABS Take 1 tablet by mouth 2 (two) times daily.     Javier Docker Oil 350 MG CAPS Take 350 mg by mouth daily.     Marland Kitchen levothyroxine (SYNTHROID) 88 MCG tablet Take one tablet daily on an empty stomach, 2 tabs on Saturday. 94 tablet 3  . Magnesium 250 MG TABS Take 1 tablet by mouth daily.    . metFORMIN (GLUCOPHAGE-XR) 500 MG 24  hr tablet Take 1 tablet (500 mg total) by mouth daily. 90 tablet 3  . metoprolol tartrate (LOPRESSOR) 25 MG tablet Take 1 tablet (25 mg total) by mouth 2 (two) times daily. 180 tablet 1  . Na Sulfate-K Sulfate-Mg Sulf 17.5-3.13-1.6 GM/177ML SOLN Take as directed 354 mL 0  . nitroGLYCERIN (NITROSTAT) 0.4 MG SL tablet Place 1 tablet (0.4 mg total) under the tongue every 5 (five) minutes as needed for chest pain. 50 tablet 3  . pantoprazole (PROTONIX) 40 MG tablet TAKE 1 TABLET TWICE A DAY 180 tablet 1  . pioglitazone (ACTOS) 30 MG tablet TAKE 1 TABLET BY MOUTH EVERY DAY 90 tablet 0  . POTASSIUM CITRATE PO Take 99 mg by mouth daily.     . Probiotic Product (PROBIOTIC DAILY PO) Take 99 mg by mouth daily.     . repaglinide (PRANDIN) 1 MG tablet TAKE 1 TABLET (1 MG TOTAL) BY MOUTH 3 (THREE) TIMES DAILY BEFORE MEALS. 270 tablet 3  . triamterene-hydrochlorothiazide (MAXZIDE-25) 37.5-25 MG tablet Take 0.5 tablets by mouth daily. 45 tablet 1  . diltiazem (CARDIZEM CD) 240 MG 24 hr capsule Take 1 capsule (240 mg total) by mouth daily. 90 capsule 3   No current facility-administered medications on file prior to visit.    Allergies  Allergen Reactions  . Epinephrine Other (See Comments)    Pass out   . Repatha [Evolocumab] Nausea And Vomiting  . Statins Other (See Comments)    Myalgias: Simvastatin also caused back ache  .  Welchol [Colesevelam Hcl]     myalgia  . Amoxicillin Itching and Rash    Has patient had a PCN reaction causing immediate rash, facial/tongue/throat swelling, SOB or lightheadedness with hypotension: No Has patient had a PCN reaction causing severe rash involving mucus membranes or skin necrosis: No Has patient had a PCN reaction that required hospitalization: No Has patient had a PCN reaction occurring within the last 10 years: Yes If all of the above answers are "NO", then may proceed with Cephalosporin use.     Family History  Problem Relation Age of Onset  . Alzheimer's disease Mother   . Diabetes type II Mother   . Hiatal hernia Mother   . Diabetes Mother   . Emphysema Father   . COPD Father   . Depression Sister        suicide  . Diabetes Sister   . Diabetes Daughter   . Colon cancer Neg Hx   . Stomach cancer Neg Hx     BP 120/72 (BP Location: Left Arm, Patient Position: Sitting, Cuff Size: Normal)   Pulse 70   Ht 5\' 6"  (1.676 m)   Wt 180 lb 9.6 oz (81.9 kg)   SpO2 100%   BMI 29.15 kg/m    Review of Systems She denies hypoglycemia.      Objective:   Physical Exam VITAL SIGNS:  See vs page GENERAL: no distress Pulses: dorsalis pedis intact bilat.   MSK: no deformity of the feet CV: trace bilat leg edema, and bilat vv's Skin:  no ulcer on the feet.  normal color and temp on the feet. Neuro: sensation is intact to touch on the feet  Lab Results  Component Value Date   HGBA1C 7.1 (A) 07/30/2019   Lab Results  Component Value Date   CREATININE 1.03 06/15/2019   BUN 21 06/15/2019   NA 137 06/15/2019   K 4.3 06/15/2019   CL 99 06/15/2019   CO2 28 06/15/2019  Assessment & Plan:  Type 2 DM, wit CAD: worse Edema: This limits pioglitazone dosage.  We may have to d/c this when glycemic control allows.  Patient Instructions  I have sent a prescription to your pharmacy, to change Januvia to Rybelsus. Please continue the same other  medications check your blood sugar once a day.  vary the time of day when you check, between before the 3 meals, and at bedtime.  also check if you have symptoms of your blood sugar being too high or too low.  please keep a record of the readings and bring it to your next appointment here (or you can bring the meter itself).  You can write it on any piece of paper.  please call us sooner if your blood sugar goes below 70, or if you have a lot of readings over 200. Please come back for a follow-up appointment in 3 months.

## 2019-07-31 DIAGNOSIS — K219 Gastro-esophageal reflux disease without esophagitis: Secondary | ICD-10-CM

## 2019-08-01 ENCOUNTER — Encounter: Payer: Self-pay | Admitting: Endocrinology

## 2019-08-02 MED ORDER — FAMOTIDINE 20 MG PO TABS: 20.0000 mg | ORAL_TABLET | Freq: Every day | ORAL | 3 refills | Status: DC

## 2019-08-10 ENCOUNTER — Other Ambulatory Visit: Payer: Self-pay

## 2019-08-10 ENCOUNTER — Encounter: Payer: Self-pay | Admitting: Family Medicine

## 2019-08-10 ENCOUNTER — Ambulatory Visit (INDEPENDENT_AMBULATORY_CARE_PROVIDER_SITE_OTHER): Payer: BC Managed Care – PPO | Admitting: Family Medicine

## 2019-08-10 VITALS — BP 119/71 | HR 69 | Temp 97.3°F | Resp 17 | Ht 66.0 in | Wt 178.0 lb

## 2019-08-10 DIAGNOSIS — R202 Paresthesia of skin: Secondary | ICD-10-CM | POA: Diagnosis not present

## 2019-08-10 DIAGNOSIS — R200 Anesthesia of skin: Secondary | ICD-10-CM | POA: Diagnosis not present

## 2019-08-10 MED ORDER — NAPROXEN 500 MG PO TABS: 500.0000 mg | ORAL_TABLET | Freq: Two times a day (BID) | ORAL | 0 refills | Status: DC

## 2019-08-10 NOTE — Progress Notes (Signed)
This visit occurred during the SARS-CoV-2 public health emergency.  Safety protocols were in place, including screening questions prior to the visit, additional usage of staff PPE, and extensive cleaning of exam room while observing appropriate contact time as indicated for disinfecting solutions.    [name] [name] , 1956-02-08, 64 y.o., female MRN: [id number] Patient Care Team    Relationship Specialty Notifications Start End  Ma Hillock, DO PCP - General Family Medicine  06/03/18   Minus Breeding, MD PCP - Cardiology Cardiology Admissions 11/14/17   Gatha Mayer, MD Consulting Physician Gastroenterology  06/05/18   Marshell Garfinkel, MD Consulting Physician Pulmonary Disease  06/05/18   Renato Shin, MD Consulting Physician Endocrinology  06/05/18     Chief Complaint  Patient presents with  . Numbness    Pt has had numbness in R arm since Sunday after COVID vaccine. Painful at times and "feels like dead weight"     Subjective: Pt presents for an OV with complaints of numbness and tingling in her right upper extremity  of 2 days duration.  Patient reports she received her first covid vaccine 2 days ago. She states the shot itself was painless. However after about 30-40 minutes, on the drive home, she noticed pain and tingling that radiated down her arm and into her hand. She states sometimes it will feel "like dead weight" and tingling. Sometimes she will have a sharp pain in her elbow area or at injection site.  Pt has tried stretches and moving to ease their symptoms.   Depression screen [facility] 2/9 06/03/2018 09/06/2014  Decreased Interest 2 0  Down, Depressed, Hopeless 1 0  PHQ - 2 Score 3 0  Altered sleeping 0 -  Tired, decreased energy 2 -  Change in appetite 3 -  Feeling bad or failure about yourself  0 -  Trouble concentrating 0 -  Moving slowly or fidgety/restless 0 -  Suicidal thoughts 0 -  PHQ-9 Score 8 -  Some recent data might be hidden    Allergies  Allergen  Reactions  . Epinephrine Other (See Comments)    Pass out   . Repatha [Evolocumab] Nausea And Vomiting  . Statins Other (See Comments)    Myalgias: Simvastatin also caused back ache  . Welchol [Colesevelam Hcl]     myalgia  . Amoxicillin Itching and Rash    Has patient had a PCN reaction causing immediate rash, facial/tongue/throat swelling, SOB or lightheadedness with hypotension: No Has patient had a PCN reaction causing severe rash involving mucus membranes or skin necrosis: No Has patient had a PCN reaction that required hospitalization: No Has patient had a PCN reaction occurring within the last 10 years: Yes If all of the above answers are "NO", then may proceed with Cephalosporin use.    Social History   Social History Narrative   Marital status/children/pets: Married, 1 child.    Education/employment: HS   Safety:      -smoke alarm in the home:Yes     - wears seatbelt: Yes     - Feels safe in their relationships: Yes   Past Medical History:  Diagnosis Date  . Accelerating angina ([place]) 11/04/2017  . Asthma   . Chronic pain disorder    neck and shoulder  . CTS (carpal tunnel syndrome) 01/27/2013   right  . Diabetes mellitus type II   . Diabetic gastroparesis ([place])   . Esophageal reflux 08/25/2013   [facility] [place], Dr Barth Kirks  . FATTY  LIVER DISEASE 04/27/2007   Qualifier: Diagnosis of  By: Danelle Earthly CMA, Darlene    . Focal muscle atrophy 09/26/2015  . Fundic gland polyps of stomach, benign   . HTN (hypertension)   . Hyperlipidemia, mixed 05/04/2007   Qualifier: Diagnosis of  By: Wynona Luna crestor caused myalgias even at low dose Livalo caused myalgias Lipitor  Mother with severe reaction myalgias Simvastatin, Welchol   . Hypertension 08/24/2010  . Hypokalemia 05/25/2013   Improved stopping HCTZ. Was noted to have an elevated glucose when K was low. Recheck renal next week after starting Maxzide  . Hypomagnesemia 08/10/2014  . Hypothyroidism   . Iron deficiency  anemia 08/07/2018  . Iron malabsorption 08/07/2018  . Laryngitis 08/25/2017  . Leg cramps 04/15/2016  . Nausea without vomiting 08/08/2014  . NONSPEC ELEVATION OF LEVELS OF TRANSAMINASE/LDH 05/01/2007   Qualifier: Diagnosis of  By: Garner Gavel    . Osteoarthritis    chronic, right knee (11/04/2017)  . OVARIAN CYST 07/11/2009   Qualifier: Diagnosis of  By: Wynona Luna   . Ovarian cyst   . Plantar fasciitis   . Rosacea 10/22/2016  . Urine incontinence    Past Surgical History:  Procedure Laterality Date  . AUGMENTATION MAMMAPLASTY Bilateral 2002  . BLADDER SUSPENSION  1981  . COLONOSCOPY    . CORONARY STENT INTERVENTION N/A 11/04/2017   Procedure: CORONARY STENT INTERVENTION;  Surgeon: Nelva Bush, MD;  Location: [facility];  Service: Cardiovascular;  Laterality: N/A;  . ESOPHAGOGASTRODUODENOSCOPY    . INTRAVASCULAR PRESSURE WIRE/FFR STUDY N/A 11/04/2017   Procedure: INTRAVASCULAR PRESSURE WIRE/FFR STUDY;  Surgeon: Nelva Bush, MD;  Location: [facility];  Service: Cardiovascular;  Laterality: N/A;  . INTRAVASCULAR ULTRASOUND/IVUS N/A 11/04/2017   Procedure: Intravascular Ultrasound/IVUS;  Surgeon: Nelva Bush, MD;  Location: [facility];  Service: Cardiovascular;  Laterality: N/A;  . KNEE ARTHROSCOPY  1998, [id number]  . LAPAROSCOPIC CHOLECYSTECTOMY  2007  . LEFT HEART CATH AND CORONARY ANGIOGRAPHY N/A 11/04/2017   Procedure: LEFT HEART CATH AND CORONARY ANGIOGRAPHY;  Surgeon: Nelva Bush, MD;  Location: [place] [facility];  Service: Cardiovascular;  Laterality: N/A;  . LEFT HEART CATHETERIZATION WITH CORONARY ANGIOGRAM N/A 11/21/2011   Procedure: LEFT HEART CATHETERIZATION WITH CORONARY ANGIOGRAM;  Surgeon: Josue Hector, MD;  Location: [facility];  Service: Cardiovascular;  Laterality: N/A;  . TONSILLECTOMY  1975  . VAGINAL HYSTERECTOMY  1983   "still have my ovaries"   Family History  Problem Relation Age of Onset  . Alzheimer's disease  Mother   . Diabetes type II Mother   . Hiatal hernia Mother   . Diabetes Mother   . Emphysema Father   . COPD Father   . Depression Sister        suicide  . Diabetes Sister   . Diabetes Daughter   . Colon cancer Neg Hx   . Stomach cancer Neg Hx    Allergies as of 08/10/2019      Reactions   Epinephrine Other (See Comments)   Pass out    Repatha [evolocumab] Nausea And Vomiting   Statins Other (See Comments)   Myalgias: Simvastatin also caused back ache   Welchol [colesevelam Hcl]    myalgia   Amoxicillin Itching, Rash   Has patient had a PCN reaction causing immediate rash, facial/tongue/throat swelling, SOB or lightheadedness with hypotension: No Has patient had a PCN reaction causing severe rash involving mucus membranes or skin necrosis: No  Has patient had a PCN reaction that required hospitalization: No Has patient had a PCN reaction occurring within the last 10 years: Yes If all of the above answers are "NO", then may proceed with Cephalosporin use.      Medication List       Accurate as of August 10, 2019  8:11 AM. If you have any questions, ask your nurse or doctor.        STOP taking these medications   Accu-Chek FastClix Lancets Misc Stopped by: Howard Pouch, DO     TAKE these medications   aspirin 81 MG tablet Take 81 mg by mouth daily.   CALTRATE 600 PO Take 600 mg by mouth 2 (two) times daily.   Contour Next Test test strip Generic drug: glucose blood 1 each by Other route daily. And lancets 1/day   Diclofenac Sodium CR 100 MG 24 hr tablet Take 1 tablet (100 mg total) by mouth daily.   diltiazem 240 MG 24 hr capsule Commonly known as: CARDIZEM CD Take 1 capsule (240 mg total) by mouth daily.   famotidine 20 MG tablet Commonly known as: PEPCID Take 1 tablet (20 mg total) by mouth at bedtime.   Farxiga 10 MG Tabs tablet Generic drug: dapagliflozin propanediol TAKE 1 TABLET BY MOUTH EVERY DAY   fenofibrate 160 MG tablet Take 1 tablet (160 mg  total) by mouth daily.   Krill Oil 350 MG Caps Take 350 mg by mouth daily.   levothyroxine 88 MCG tablet Commonly known as: SYNTHROID Take one tablet daily on an empty stomach, 2 tabs on Saturday.   Magnesium 250 MG Tabs Take 1 tablet by mouth daily.   metFORMIN 500 MG 24 hr tablet Commonly known as: GLUCOPHAGE-XR Take 1 tablet (500 mg total) by mouth daily.   metoprolol tartrate 25 MG tablet Commonly known as: LOPRESSOR Take 1 tablet (25 mg total) by mouth 2 (two) times daily.   Na Sulfate-K Sulfate-Mg Sulf 17.5-3.13-1.6 GM/177ML Soln Take as directed   nitroGLYCERIN 0.4 MG SL tablet Commonly known as: NITROSTAT Place 1 tablet (0.4 mg total) under the tongue every 5 (five) minutes as needed for chest pain.   Osteo Bi-Flex Regular Strength 250-200 MG Tabs Generic drug: Glucosamine-Chondroitin Take 1 tablet by mouth 2 (two) times daily.   pantoprazole 40 MG tablet Commonly known as: PROTONIX TAKE 1 TABLET TWICE A DAY   pioglitazone 30 MG tablet Commonly known as: ACTOS TAKE 1 TABLET BY MOUTH EVERY DAY   POTASSIUM CITRATE PO Take 99 mg by mouth daily.   PROBIOTIC DAILY PO Take 99 mg by mouth daily.   repaglinide 1 MG tablet Commonly known as: PRANDIN TAKE 1 TABLET (1 MG TOTAL) BY MOUTH 3 (THREE) TIMES DAILY BEFORE MEALS.   Rybelsus 3 MG Tabs Generic drug: Semaglutide Take 3 mg by mouth daily.   triamterene-hydrochlorothiazide 37.5-25 MG tablet Commonly known as: MAXZIDE-25 Take 0.5 tablets by mouth daily.       All past medical history, surgical history, allergies, family history, immunizations andmedications were updated in the EMR today and reviewed under the history and medication portions of their EMR.     ROS: Negative, with the exception of above mentioned in HPI   Objective:  BP 119/71 (BP Location: Left Arm, Patient Position: Sitting, Cuff Size: Normal)   Pulse 69   Temp (!) 97.3 F (36.3 C) (Temporal)   Resp 17   Ht 5\' 6"  (1.676 m)   Wt  178 lb (80.7 kg)  SpO2 95%   BMI 28.73 kg/m  Body mass index is 28.73 kg/m. Gen: Afebrile. No acute distress. Nontoxic in appearance, well developed, well nourished.  HENT: AT. [place]. Eyes:Pupils Equal Round Reactive to light, Extraocular movements intact,  Conjunctiva without redness, discharge or icterus. Neck/lymp/endocrine: Supple,no cervical or axillary  lymphadenopathy MSK (right arm): no erythema or swelling. Visible injection site located lower pole of deltoid. TTP over injection site. FROM arm, wrist and hand. Neg tinels at elbow. + tinels at wrist. NV intact distally.  Skin: no rashes, purpura or petechiae.  Neuro:  Normal gait. PERLA. EOMi. Alert. Oriented x3   No exam data present No results found. No results found for this or any previous visit (from the past 24 hour(s)).  Assessment/Plan: SHARMANE DALTO is a 64 y.o. female present for OV for  Numbness and tingling of right upper extremity Injection site location is in the very lower pole of her deltoid muscle- likely causing nerve pain.  - may ice injection site.  - naproxen BID x5-7 days scheduled with food, then PRN.  - keep arm moving- continue to use, perform stretches.  - may receive 2 nd covid injection in left arm when due- close attention to proper vaccination technique. - f/u 2 weeks if not improving.     Reviewed expectations re: course of current medical issues.  Discussed self-management of symptoms.  Outlined signs and symptoms indicating need for more acute intervention.  Patient verbalized understanding and all questions were answered.  Patient received an After-Visit Summary.    No orders of the defined types were placed in this encounter.  No orders of the defined types were placed in this encounter.  Referral Orders  No referral(s) requested today     Note is dictated utilizing voice recognition software. Although note has been proof read prior to signing, occasional typographical  errors still can be missed. If any questions arise, please do not hesitate to call for verification.   electronically signed by:  Howard Pouch, DO  [place]

## 2019-08-10 NOTE — Patient Instructions (Signed)
Keep your arm moving- stretching.  Use naproxen 5-7 days with food- this is to take out the inflammation. You may continue to use it after 5-7 days if needed.

## 2019-08-13 ENCOUNTER — Other Ambulatory Visit: Payer: Self-pay | Admitting: *Deleted

## 2019-08-13 DIAGNOSIS — I83813 Varicose veins of bilateral lower extremities with pain: Secondary | ICD-10-CM

## 2019-08-16 ENCOUNTER — Ambulatory Visit (HOSPITAL_COMMUNITY)
Admission: RE | Admit: 2019-08-16 | Discharge: 2019-08-16 | Disposition: A | Discharge status: Home / Self Care | Payer: BC Managed Care – PPO | Source: Ambulatory Visit | Attending: Surgery | Admitting: Surgery

## 2019-08-16 ENCOUNTER — Other Ambulatory Visit: Payer: Self-pay | Admitting: Endocrinology

## 2019-08-16 ENCOUNTER — Ambulatory Visit: Payer: BC Managed Care – PPO | Admitting: Physician Assistant

## 2019-08-16 ENCOUNTER — Other Ambulatory Visit: Payer: Self-pay

## 2019-08-16 ENCOUNTER — Encounter: Payer: Self-pay | Admitting: Surgery

## 2019-08-16 VITALS — BP 113/68 | HR 71 | Temp 97.4°F | Resp 16 | Ht 65.5 in | Wt 177.0 lb

## 2019-08-16 DIAGNOSIS — I83813 Varicose veins of bilateral lower extremities with pain: Secondary | ICD-10-CM | POA: Insufficient documentation

## 2019-08-16 DIAGNOSIS — I872 Venous insufficiency (chronic) (peripheral): Secondary | ICD-10-CM | POA: Diagnosis not present

## 2019-08-16 DIAGNOSIS — M79604 Pain in right leg: Secondary | ICD-10-CM

## 2019-08-16 DIAGNOSIS — M79605 Pain in left leg: Secondary | ICD-10-CM | POA: Diagnosis not present

## 2019-08-16 NOTE — Progress Notes (Signed)
Requested by:  Ma Hillock, DO [address] [place],  [place] [address]  Reason for consultation: painful legs, left greater than right    History of Present Illness   [name] [name] is a 64 y.o. (16-Jun-1955) female who presents for evaluation of bilateral lower extremity pain.  She states the pain is worse in her right leg.  She describes the pain as heavy and tired feeling.  She will note some mild edema after long periods of time on her feet.  She also complains of numbness in her feet and lower legs when she sits for a long period of time.  This is made worse when her legs are dependent and unsupported.  She complains of mild burning in the right ankle.  She denies throbbing, itching, bleeding veins or history of ulcer.  She experiences this daily and onset of symptoms began in November or December of last year.  Venous symptoms include: (aching, heavy, tired, throbbing, burning, itching, swelling, bleeding, ulcer)  See Above Onset/duration:  4-5 months ago/daily  Occupation:  Energy manager Aggravating factors: standing Alleviating factors: elevation Compression:  Not used Helps:   Pain medications:  Does not use pain med. Currently on naproxen due to right arm pain from Covid injection without improvement in leg pain Previous vein procedures:  none History of DVT:  none  Past Medical History:  Diagnosis Date  . Accelerating angina ([place]) 11/04/2017  . Asthma   . Chronic pain disorder    neck and shoulder  . CTS (carpal tunnel syndrome) 01/27/2013   right  . Diabetes mellitus type II   . Diabetic gastroparesis ([place])   . Esophageal reflux 08/25/2013   [place] [facility] [place], Dr Barth Kirks  . FATTY LIVER DISEASE 04/27/2007   Qualifier: Diagnosis of  By: Danelle Earthly CMA, Darlene    . Focal muscle atrophy 09/26/2015  . Fundic gland polyps of stomach, benign   . HTN (hypertension)   . Hyperlipidemia, mixed 05/04/2007   Qualifier: Diagnosis of  By: Wynona Luna crestor caused  myalgias even at low dose Livalo caused myalgias Lipitor  Mother with severe reaction myalgias Simvastatin, Welchol   . Hypertension 08/24/2010  . Hypokalemia 05/25/2013   Improved stopping HCTZ. Was noted to have an elevated glucose when K was low. Recheck renal next week after starting Maxzide  . Hypomagnesemia 08/10/2014  . Hypothyroidism   . Iron deficiency anemia 08/07/2018  . Iron malabsorption 08/07/2018  . Laryngitis 08/25/2017  . Leg cramps 04/15/2016  . Nausea without vomiting 08/08/2014  . NONSPEC ELEVATION OF LEVELS OF TRANSAMINASE/LDH 05/01/2007   Qualifier: Diagnosis of  By: Garner Gavel    . Osteoarthritis    chronic, right knee (11/04/2017)  . OVARIAN CYST 07/11/2009   Qualifier: Diagnosis of  By: Wynona Luna   . Ovarian cyst   . Plantar fasciitis   . Rosacea 10/22/2016  . Urine incontinence     Past Surgical History:  Procedure Laterality Date  . AUGMENTATION MAMMAPLASTY Bilateral 2002  . BLADDER SUSPENSION  1981  . COLONOSCOPY    . CORONARY STENT INTERVENTION N/A 11/04/2017   Procedure: CORONARY STENT INTERVENTION;  Surgeon: Nelva Bush, MD;  Location: [facility];  Service: Cardiovascular;  Laterality: N/A;  . ESOPHAGOGASTRODUODENOSCOPY    . INTRAVASCULAR PRESSURE WIRE/FFR STUDY N/A 11/04/2017   Procedure: INTRAVASCULAR PRESSURE WIRE/FFR STUDY;  Surgeon: Nelva Bush, MD;  Location: [facility];  Service: Cardiovascular;  Laterality: N/A;  . INTRAVASCULAR  ULTRASOUND/IVUS N/A 11/04/2017   Procedure: Intravascular Ultrasound/IVUS;  Surgeon: Nelva Bush, MD;  Location: [facility];  Service: Cardiovascular;  Laterality: N/A;  . KNEE ARTHROSCOPY  1998, [id number]  . LAPAROSCOPIC CHOLECYSTECTOMY  2007  . LEFT HEART CATH AND CORONARY ANGIOGRAPHY N/A 11/04/2017   Procedure: LEFT HEART CATH AND CORONARY ANGIOGRAPHY;  Surgeon: Nelva Bush, MD;  Location: [facility];  Service: Cardiovascular;  Laterality: N/A;  . LEFT HEART  CATHETERIZATION WITH CORONARY ANGIOGRAM N/A 11/21/2011   Procedure: LEFT HEART CATHETERIZATION WITH CORONARY ANGIOGRAM;  Surgeon: Josue Hector, MD;  Location: [facility];  Service: Cardiovascular;  Laterality: N/A;  . TONSILLECTOMY  1975  . VAGINAL HYSTERECTOMY  1983   "still have my ovaries"    Social History   Socioeconomic History  . Marital status: Married    Spouse name: Not on file  . Number of children: 1  . Years of education: Not on file  . Highest education level: Not on file  Occupational History  . Occupation: [employer]  Tobacco Use  . Smoking status: Never Smoker  . Smokeless tobacco: Never Used  Substance and Sexual Activity  . Alcohol use: Not Currently  . Drug use: Never  . Sexual activity: Not Currently    Partners: Male  Other Topics Concern  . Not on file  Social History Narrative   Marital status/children/pets: Married, 1 child.    Education/employment: HS   Safety:      -smoke alarm in the home:Yes     - wears seatbelt: Yes     - Feels safe in their relationships: Yes   Social Determinants of Health   Financial Resource Strain:   . Difficulty of Paying Living Expenses:   Food Insecurity:   . Worried About Charity fundraiser in the Last Year:   . Arboriculturist in the Last Year:   Transportation Needs:   . Film/video editor (Medical):   Marland Kitchen Lack of Transportation (Non-Medical):   Physical Activity:   . Days of Exercise per Week:   . Minutes of Exercise per Session:   Stress:   . Feeling of Stress :   Social Connections:   . Frequency of Communication with Friends and Family:   . Frequency of Social Gatherings with Friends and Family:   . Attends Religious Services:   . Active Member of Clubs or Organizations:   . Attends Archivist Meetings:   Marland Kitchen Marital Status:   Intimate Partner Violence:   . Fear of Current or Ex-Partner:   . Emotionally Abused:   Marland Kitchen Physically Abused:   . Sexually Abused:      Family History  Problem Relation Age of Onset  . Alzheimer's disease Mother   . Diabetes type II Mother   . Hiatal hernia Mother   . Diabetes Mother   . Emphysema Father   . COPD Father   . Depression Sister        suicide  . Diabetes Sister   . Diabetes Daughter   . Colon cancer Neg Hx   . Stomach cancer Neg Hx     Current Outpatient Medications  Medication Sig Dispense Refill  . aspirin 81 MG tablet Take 81 mg by mouth daily.     . Calcium Carbonate (CALTRATE 600 PO) Take 600 mg by mouth 2 (two) times daily.     . Diclofenac Sodium CR 100 MG 24 hr tablet Take 1 tablet (100 mg  total) by mouth daily. 30 tablet 1  . diltiazem (CARDIZEM CD) 240 MG 24 hr capsule Take 1 capsule (240 mg total) by mouth daily. 90 capsule 3  . famotidine (PEPCID) 20 MG tablet Take 1 tablet (20 mg total) by mouth at bedtime. 90 tablet 3  . FARXIGA 10 MG TABS tablet TAKE 1 TABLET BY MOUTH EVERY DAY 90 tablet 3  . fenofibrate 160 MG tablet Take 1 tablet (160 mg total) by mouth daily. 90 tablet 3  . Glucosamine-Chondroitin (OSTEO BI-FLEX REGULAR STRENGTH) 250-200 MG TABS Take 1 tablet by mouth 2 (two) times daily.     Marland Kitchen glucose blood (CONTOUR NEXT TEST) test strip 1 each by Other route daily. And lancets 1/day (Patient not taking: Reported on 08/10/2019) 100 each 12  . Krill Oil 350 MG CAPS Take 350 mg by mouth daily.     Marland Kitchen levothyroxine (SYNTHROID) 88 MCG tablet Take one tablet daily on an empty stomach, 2 tabs on Saturday. 94 tablet 3  . Magnesium 250 MG TABS Take 1 tablet by mouth daily.    . metFORMIN (GLUCOPHAGE-XR) 500 MG 24 hr tablet Take 1 tablet (500 mg total) by mouth daily. 90 tablet 3  . metoprolol tartrate (LOPRESSOR) 25 MG tablet Take 1 tablet (25 mg total) by mouth 2 (two) times daily. 180 tablet 1  . Na Sulfate-K Sulfate-Mg Sulf 17.5-3.13-1.6 GM/177ML SOLN Take as directed 354 mL 0  . naproxen (NAPROSYN) 500 MG tablet Take 1 tablet (500 mg total) by mouth 2 (two) times daily with a meal. 30  tablet 0  . nitroGLYCERIN (NITROSTAT) 0.4 MG SL tablet Place 1 tablet (0.4 mg total) under the tongue every 5 (five) minutes as needed for chest pain. 50 tablet 3  . pantoprazole (PROTONIX) 40 MG tablet TAKE 1 TABLET TWICE A DAY 180 tablet 1  . pioglitazone (ACTOS) 30 MG tablet TAKE 1 TABLET BY MOUTH EVERY DAY 90 tablet 0  . POTASSIUM CITRATE PO Take 99 mg by mouth daily.     . Probiotic Product (PROBIOTIC DAILY PO) Take 99 mg by mouth daily.     . repaglinide (PRANDIN) 1 MG tablet TAKE 1 TABLET (1 MG TOTAL) BY MOUTH 3 (THREE) TIMES DAILY BEFORE MEALS. 270 tablet 3  . Semaglutide (RYBELSUS) 3 MG TABS Take 3 mg by mouth daily. 90 tablet 3  . triamterene-hydrochlorothiazide (MAXZIDE-25) 37.5-25 MG tablet Take 0.5 tablets by mouth daily. 45 tablet 1   No current facility-administered medications for this visit.  No history of taking Coumadin, Xarelto or Eliquis  Allergies  Allergen Reactions  . Epinephrine Other (See Comments)    Pass out   . Repatha [Evolocumab] Nausea And Vomiting  . Statins Other (See Comments)    Myalgias: Simvastatin also caused back ache  . Welchol [Colesevelam Hcl]     myalgia  . Amoxicillin Itching and Rash    Has patient had a PCN reaction causing immediate rash, facial/tongue/throat swelling, SOB or lightheadedness with hypotension: No Has patient had a PCN reaction causing severe rash involving mucus membranes or skin necrosis: No Has patient had a PCN reaction that required hospitalization: No Has patient had a PCN reaction occurring within the last 10 years: Yes If all of the above answers are "NO", then may proceed with Cephalosporin use.     REVIEW OF SYSTEMS (negative unless checked):   Cardiac:  []  Chest pain or chest pressure? []  Shortness of breath upon activity? []  Shortness of breath when lying flat? []  Irregular heart  rhythm?  Vascular:  []  Pain in calf, thigh, or hip brought on by walking? []  Pain in feet at night that wakes you up from  your sleep? []  Blood clot in your veins? []  Leg swelling?  Pulmonary:  []  Oxygen at home? []  Productive cough? []  Wheezing?  Neurologic:  []  Sudden weakness in arms or legs? []  Sudden numbness in arms or legs? []  Sudden onset of difficult speaking or slurred speech? []  Temporary loss of vision in one eye? []  Problems with dizziness?  Gastrointestinal:  []  Blood in stool? []  Vomited blood?  Genitourinary:  []  Burning when urinating? []  Blood in urine?  Psychiatric:  []  Major depression  Hematologic:  []  Bleeding problems? []  Problems with blood clotting?  Dermatologic:  []  Rashes or ulcers?  Constitutional:  []  Fever or chills?  Ear/Nose/Throat:  []  Change in hearing? []  Nose bleeds? []  Sore throat?  Musculoskeletal:  []  Back pain? []  Joint pain? []  Muscle pain?   Physical Examination     Vitals:   08/16/19 1422  Weight: 177 lb (80.3 kg)  Height: 5' 5.5" (1.664 m)   General:  WDWN in NAD; vital signs documented above Gait: Not observed HENT: WNL, normocephalic Pulmonary: normal non-labored breathing , without Rales, rhonchi,  wheezing Cardiac: regular HR, without  Murmurs without carotid bruits Abdomen: soft, NT, no masses Skin: without rashes Vascular Exam/Pulses:  Right Left  Radial 2+ (normal) 2+ (normal)  Ulnar Not eval Not eval  Femoral 2+ (normal) 1+ (weak)  Popliteal Not palp Not palp  DP 2+ (normal) 2+ (normal)  PT Not palp Not palp   Extremities: without varicose veins, with reticular veins, without edema, without stasis pigmentation, without lipodermatosclerosis, without ulcers Musculoskeletal: no muscle wasting or atrophy  Neurologic: A&O X 3;  No focal weakness or paresthesias are detected Psychiatric:  The pt has Normal affect.  Non-invasive Vascular Imaging   BLE Venous Insufficiency Duplex ():   RLE:   no DVT and SVT,   positive GSV reflux >542m/s,  GSV diameter 0.274-0.338  no SSV reflux   no deep venous  reflux   LLE:  no DVT and SVT,   yes GSV reflux >554m/s   GSV diameter  0.342 -0.504,  yes SSV reflux >552m/s  no deep venous reflux   Medical Decision Making   [name] [name] is a 64 y.o. female who presents with: LE chronic venous insufficiency manifested by lower extremity pain right greater than left.  This interferes with her ADLs and ability to exercise.   Based on the patient's history and examination, I recommend: consideration for LLE vein ablation therapy, although her right leg is giving her the most symptoms at the moment.  I discussed with the patient the use of her 20-30 mm thigh high compression stockings and need for 3 month trial of such.  I have given her information regarding elevation of her legs to heart level although she reports significant reflux symptoms when lying supine  No evidence of arterial disease  The patient will follow up in 3 months with Dr. Scot Dock or Oneida Alar  Thank you for allowing [place] to participate in this patient's care.   Barbie Banner, PA-C [facility] Office: [phone]  08/16/2019, 2:04 PM  Clinic MD: Trula Slade

## 2019-08-20 ENCOUNTER — Other Ambulatory Visit: Payer: Self-pay | Admitting: Endocrinology

## 2019-08-20 DIAGNOSIS — E1143 Type 2 diabetes mellitus with diabetic autonomic (poly)neuropathy: Secondary | ICD-10-CM

## 2019-08-24 DIAGNOSIS — I8393 Asymptomatic varicose veins of bilateral lower extremities: Secondary | ICD-10-CM

## 2019-09-01 LAB — HM DIABETES EYE EXAM

## 2019-09-07 ENCOUNTER — Other Ambulatory Visit: Payer: Self-pay | Admitting: Endocrinology

## 2019-09-12 ENCOUNTER — Other Ambulatory Visit: Payer: Self-pay | Admitting: Family Medicine

## 2019-09-28 ENCOUNTER — Other Ambulatory Visit: Payer: Self-pay

## 2019-09-28 DIAGNOSIS — E1143 Type 2 diabetes mellitus with diabetic autonomic (poly)neuropathy: Secondary | ICD-10-CM

## 2019-09-28 MED ORDER — METFORMIN HCL ER 500 MG PO TB24: 1000.0000 mg | ORAL_TABLET | Freq: Every day | ORAL | 0 refills | Status: DC

## 2019-09-28 MED ORDER — REPAGLINIDE 1 MG PO TABS: 1.0000 mg | ORAL_TABLET | Freq: Three times a day (TID) | ORAL | 0 refills | Status: DC

## 2019-09-28 MED ORDER — PIOGLITAZONE HCL 30 MG PO TABS: 30.0000 mg | ORAL_TABLET | Freq: Every day | ORAL | 0 refills | Status: DC

## 2019-10-08 ENCOUNTER — Other Ambulatory Visit: Payer: Self-pay

## 2019-10-08 DIAGNOSIS — K219 Gastro-esophageal reflux disease without esophagitis: Secondary | ICD-10-CM

## 2019-10-08 MED ORDER — FAMOTIDINE 20 MG PO TABS: 20.0000 mg | ORAL_TABLET | Freq: Every day | ORAL | 3 refills | Status: DC

## 2019-10-11 ENCOUNTER — Other Ambulatory Visit: Payer: Self-pay

## 2019-10-11 DIAGNOSIS — K219 Gastro-esophageal reflux disease without esophagitis: Secondary | ICD-10-CM

## 2019-10-11 MED ORDER — FAMOTIDINE 20 MG PO TABS: 20.0000 mg | ORAL_TABLET | Freq: Every day | ORAL | 3 refills | Status: DC

## 2019-10-11 MED ORDER — PANTOPRAZOLE SODIUM 40 MG PO TBEC: 40.0000 mg | DELAYED_RELEASE_TABLET | Freq: Two times a day (BID) | ORAL | 1 refills | Status: DC

## 2019-10-11 NOTE — Telephone Encounter (Signed)
Famotidine and pantoprazole both refilled as [facility] pharmacy requested.

## 2019-10-22 ENCOUNTER — Other Ambulatory Visit: Payer: Self-pay

## 2019-10-22 DIAGNOSIS — I10 Essential (primary) hypertension: Secondary | ICD-10-CM

## 2019-10-22 DIAGNOSIS — K219 Gastro-esophageal reflux disease without esophagitis: Secondary | ICD-10-CM

## 2019-10-22 MED ORDER — TRIAMTERENE-HCTZ 37.5-25 MG PO TABS: 0.5000 | ORAL_TABLET | Freq: Every day | ORAL | 1 refills | Status: DC

## 2019-10-27 ENCOUNTER — Telehealth: Payer: Self-pay

## 2019-10-27 DIAGNOSIS — E039 Hypothyroidism, unspecified: Secondary | ICD-10-CM

## 2019-10-27 MED ORDER — LEVOTHYROXINE SODIUM 88 MCG PO TABS: ORAL_TABLET | ORAL | 3 refills | Status: DC

## 2019-10-27 NOTE — Addendum Note (Signed)
Addended by: Caroll Rancher L on: 10/27/2019 10:24 AM   Modules accepted: Orders

## 2019-10-27 NOTE — Telephone Encounter (Signed)
Pt was called and her husband retired so she had a insurance change. She now has to use [employer] as local and [employer] order for long term meds. [facility] and sent to [facility] mail order.

## 2019-10-27 NOTE — Telephone Encounter (Signed)
Received refill request from [facility] mail order for patients Levothyroxine. Pt was called and message was left to return call and let us know whether she wanted everything transferred to Mail order or continue to use [facility].

## 2019-10-29 ENCOUNTER — Ambulatory Visit: Payer: BC Managed Care – PPO | Admitting: Family Medicine

## 2019-10-31 ENCOUNTER — Encounter: Payer: Self-pay | Admitting: Endocrinology

## 2019-11-01 ENCOUNTER — Other Ambulatory Visit: Payer: Self-pay | Admitting: Endocrinology

## 2019-11-01 ENCOUNTER — Other Ambulatory Visit: Payer: Self-pay

## 2019-11-01 DIAGNOSIS — Z7189 Other specified counseling: Secondary | ICD-10-CM | POA: Insufficient documentation

## 2019-11-01 MED ORDER — SITAGLIPTIN PHOSPHATE 100 MG PO TABS: 100.0000 mg | ORAL_TABLET | Freq: Every day | ORAL | 0 refills | Status: DC

## 2019-11-01 MED ORDER — METOPROLOL TARTRATE 25 MG PO TABS: 25.0000 mg | ORAL_TABLET | Freq: Two times a day (BID) | ORAL | 0 refills | Status: DC

## 2019-11-01 NOTE — Progress Notes (Signed)
Cardiology Office Note   Date:  11/02/2019   ID:  [name] [name], DOB Jun 01, 1955, MRN [id number]  PCP:  Ma Hillock, DO  Cardiologist:   Minus Breeding, MD Referring:  Ma Hillock, DO  Chief Complaint  Patient presents with  . Shortness of Breath      History of Present Illness: [name] [name] is a 64 y.o. female who presents for evaluation of CAD.  She had dyspnea with normal echo and a coronary CTA was abnormal showing LAD disease. Cath done 11/04/17 showed 60% LAD, FFR was significant and she underwent PCI with DES. She had mild scattered disease elsewhere.     She presents for followup.  Since I saw her she started getting a little more shortness of breath with activities.  She has been relatively inactive with work and with the pandemic.  She thinks that this shortness of breath is slightly reminiscent of what she had prior to her angioplasty.  She is not having any chest pressure, neck or arm discomfort.  She is not having any resting shortness of breath, PND or orthopnea.  She has no palpitations, presyncope or syncope.  She has had a little ankle swelling and varicose veins.  Past Medical History:  Diagnosis Date  . Accelerating angina ([place]) 11/04/2017  . Asthma   . Chronic pain disorder    neck and shoulder  . CTS (carpal tunnel syndrome) 01/27/2013   right  . Diabetes mellitus type II   . Diabetic gastroparesis ([place])   . Esophageal reflux 08/25/2013   [facility] [place], Dr Barth Kirks  . FATTY LIVER DISEASE 04/27/2007   Qualifier: Diagnosis of  By: Danelle Earthly CMA, Darlene    . Focal muscle atrophy 09/26/2015  . Fundic gland polyps of stomach, benign   . HTN (hypertension)   . Hyperlipidemia, mixed 05/04/2007   Qualifier: Diagnosis of  By: Wynona Luna crestor caused myalgias even at low dose Livalo caused myalgias Lipitor  Mother with severe reaction myalgias Simvastatin, Welchol   . Hypertension 08/24/2010  . Hypokalemia 05/25/2013   Improved stopping HCTZ. Was  noted to have an elevated glucose when K was low. Recheck renal next week after starting Maxzide  . Hypomagnesemia 08/10/2014  . Hypothyroidism   . Iron deficiency anemia 08/07/2018  . Iron malabsorption 08/07/2018  . Laryngitis 08/25/2017  . Leg cramps 04/15/2016  . Nausea without vomiting 08/08/2014  . NONSPEC ELEVATION OF LEVELS OF TRANSAMINASE/LDH 05/01/2007   Qualifier: Diagnosis of  By: Garner Gavel    . Osteoarthritis    chronic, right knee (11/04/2017)  . OVARIAN CYST 07/11/2009   Qualifier: Diagnosis of  By: Wynona Luna   . Ovarian cyst   . Plantar fasciitis   . Rosacea 10/22/2016  . Urine incontinence     Past Surgical History:  Procedure Laterality Date  . AUGMENTATION MAMMAPLASTY Bilateral 2002  . BLADDER SUSPENSION  1981  . COLONOSCOPY    . CORONARY STENT INTERVENTION N/A 11/04/2017   Procedure: CORONARY STENT INTERVENTION;  Surgeon: Nelva Bush, MD;  Location: [facility];  Service: Cardiovascular;  Laterality: N/A;  . ESOPHAGOGASTRODUODENOSCOPY    . INTRAVASCULAR PRESSURE WIRE/FFR STUDY N/A 11/04/2017   Procedure: INTRAVASCULAR PRESSURE WIRE/FFR STUDY;  Surgeon: Nelva Bush, MD;  Location: [facility];  Service: Cardiovascular;  Laterality: N/A;  . INTRAVASCULAR ULTRASOUND/IVUS N/A 11/04/2017   Procedure: Intravascular Ultrasound/IVUS;  Surgeon: Nelva Bush, MD;  Location: [facility];  Service: Cardiovascular;  Laterality: N/A;  . KNEE ARTHROSCOPY  1998, [id number]  . LAPAROSCOPIC CHOLECYSTECTOMY  2007  . LEFT HEART CATH AND CORONARY ANGIOGRAPHY N/A 11/04/2017   Procedure: LEFT HEART CATH AND CORONARY ANGIOGRAPHY;  Surgeon: Nelva Bush, MD;  Location: [place];  Service: Cardiovascular;  Laterality: N/A;  . LEFT HEART CATHETERIZATION WITH CORONARY ANGIOGRAM N/A 11/21/2011   Procedure: LEFT HEART CATHETERIZATION WITH CORONARY ANGIOGRAM;  Surgeon: Josue Hector, MD;  Location: [facility] CATH LAB;  Service: Cardiovascular;   Laterality: N/A;  . TONSILLECTOMY  1975  . VAGINAL HYSTERECTOMY  1983   "still have my ovaries"     Current Outpatient Medications  Medication Sig Dispense Refill  . ACCU-CHEK SMARTVIEW test strip USE TO CHECK BLOOD SUGARS ONCE DAILY 100 strip 12  . aspirin 81 MG tablet Take 81 mg by mouth daily.     . Calcium Carbonate (CALTRATE 600 PO) Take 600 mg by mouth 2 (two) times daily.     Marland Kitchen diltiazem (CARDIZEM CD) 240 MG 24 hr capsule Take 1 capsule (240 mg total) by mouth daily. 90 capsule 3  . famotidine (PEPCID) 20 MG tablet Take 1 tablet (20 mg total) by mouth at bedtime. 90 tablet 3  . FARXIGA 10 MG TABS tablet TAKE 1 TABLET BY MOUTH EVERY DAY 90 tablet 3  . fenofibrate 160 MG tablet Take 1 tablet (160 mg total) by mouth daily. 90 tablet 3  . Glucosamine-Chondroitin (OSTEO BI-FLEX REGULAR STRENGTH) 250-200 MG TABS Take 1 tablet by mouth 2 (two) times daily.     Javier Docker Oil 350 MG CAPS Take 350 mg by mouth daily.     Marland Kitchen levothyroxine (SYNTHROID) 88 MCG tablet Take one tablet daily on an empty stomach, 2 tabs on Saturday. 102 tablet 3  . Magnesium 250 MG TABS Take 1 tablet by mouth daily.    . metFORMIN (GLUCOPHAGE-XR) 500 MG 24 hr tablet Take 2 tablets (1,000 mg total) by mouth daily. (Patient taking differently: Take 500 mg by mouth daily. ) 180 tablet 0  . metoprolol tartrate (LOPRESSOR) 25 MG tablet Take 1 tablet (25 mg total) by mouth 2 (two) times daily. 180 tablet 3  . nitroGLYCERIN (NITROSTAT) 0.4 MG SL tablet Place 1 tablet (0.4 mg total) under the tongue every 5 (five) minutes as needed for chest pain. 50 tablet 3  . pantoprazole (PROTONIX) 40 MG tablet Take 1 tablet (40 mg total) by mouth 2 (two) times daily. 180 tablet 1  . pioglitazone (ACTOS) 30 MG tablet Take 1 tablet (30 mg total) by mouth daily. 90 tablet 0  . POTASSIUM CITRATE PO Take 99 mg by mouth daily.     . Probiotic Product (PROBIOTIC DAILY PO) Take 99 mg by mouth daily.     . repaglinide (PRANDIN) 1 MG tablet Take 1  tablet (1 mg total) by mouth 3 (three) times daily before meals. 270 tablet 0  . Semaglutide (RYBELSUS) 3 MG TABS Take 3 mg by mouth daily. 90 tablet 3  . triamterene-hydrochlorothiazide (MAXZIDE-25) 37.5-25 MG tablet Take 0.5 tablets by mouth daily. 45 tablet 1   No current facility-administered medications for this visit.    Allergies:   Epinephrine, Repatha [evolocumab], Statins, Welchol [colesevelam hcl], and Amoxicillin     ROS:  Please see the history of present illness.   Otherwise, review of systems are positive for none.   All other systems are reviewed and negative.    PHYSICAL EXAM: VS:  BP 112/64   Pulse 76  Ht 5\' 5"  (1.651 m)   Wt 179 lb (81.2 kg)   SpO2 96%   BMI 29.79 kg/m  , BMI Body mass index is 29.79 kg/m. GENERAL:  Well appearing NECK:  No jugular venous distention, waveform within normal limits, carotid upstroke brisk and symmetric, no bruits, no thyromegaly LUNGS:  Clear to auscultation bilaterally CHEST:  Unremarkable HEART:  PMI not displaced or sustained,S1 and S2 within normal limits, no S3, no S4, no clicks, no rubs, no murmurs ABD:  Flat, positive bowel sounds normal in frequency in pitch, no bruits, no rebound, no guarding, no midline pulsatile mass, no hepatomegaly, no splenomegaly EXT:  2 plus pulses throughout, no edema, no cyanosis no clubbing     EKG:  EKG is ordered today. Sinus rhythm, rate 76, axis within normal limits, intervals within normal limits, no acute ST-T wave changes.   Recent Labs: 06/15/2019: ALT 26; BUN 21; Creatinine, Ser 1.03; Hemoglobin 13.9; Magnesium 1.5; Platelets 278.0; Potassium 4.3; Sodium 137; TSH 1.19    Lipid Panel    Component Value Date/Time   CHOL 220 (H) 08/25/2017 1620   TRIG 124.0 08/25/2017 1620   HDL 50.20 08/25/2017 1620   CHOLHDL 4 08/25/2017 1620   VLDL 24.8 08/25/2017 1620   LDLCALC 145 (H) 08/25/2017 1620   LDLDIRECT 151.0 06/01/2015 0812      Wt Readings from Last 3 Encounters:    11/02/19 179 lb (81.2 kg)  08/16/19 177 lb (80.3 kg)  08/10/19 178 lb (80.7 kg)      Other studies Reviewed:  Additional studies/ records that were reviewed today include: None Review of the above records demonstrates:  NA   ASSESSMENT AND PLAN:  CAD:    She does have some shortness of breath.  Given this I will plan on screening her with a POET (Plain Old Exercise Treadmill) she will otherwise continue with risk reduction.  HTN:    The blood pressure is at target.  No change in therapy.   HYPERLIPIDEMIA:   I do not see a recent LDL.  She was unable to take PCSK9 inhibitor.  She was intolerant of statins.  I would like to measure this again however.   COVID EDUCATION: She has been vaccinated.  Current medicines are reviewed at length with the patient today.  The patient does not have concerns regarding medicines.  The following changes have been made:  None  Labs/ tests ordered today include:  None  Orders Placed This Encounter  Procedures  . Lipid panel  . Hepatic function panel  . EXERCISE TOLERANCE TEST (ETT)  . EKG 12-Lead     Disposition:   FU with in 12 months or sooner based on the results of the above testing or further symptoms.   Signed, Minus Breeding, MD  11/02/2019 4:38 PM    [facility]

## 2019-11-02 ENCOUNTER — Other Ambulatory Visit: Payer: Self-pay

## 2019-11-02 ENCOUNTER — Encounter: Payer: Self-pay | Admitting: Cardiology

## 2019-11-02 ENCOUNTER — Ambulatory Visit: Payer: BC Managed Care – PPO | Admitting: Cardiology

## 2019-11-02 VITALS — BP 112/64 | HR 76 | Ht 65.0 in | Wt 179.0 lb

## 2019-11-02 DIAGNOSIS — Z7189 Other specified counseling: Secondary | ICD-10-CM

## 2019-11-02 DIAGNOSIS — E785 Hyperlipidemia, unspecified: Secondary | ICD-10-CM

## 2019-11-02 DIAGNOSIS — I10 Essential (primary) hypertension: Secondary | ICD-10-CM | POA: Diagnosis not present

## 2019-11-02 DIAGNOSIS — I2510 Atherosclerotic heart disease of native coronary artery without angina pectoris: Secondary | ICD-10-CM

## 2019-11-02 DIAGNOSIS — R0602 Shortness of breath: Secondary | ICD-10-CM

## 2019-11-02 MED ORDER — METOPROLOL TARTRATE 25 MG PO TABS: 25.0000 mg | ORAL_TABLET | Freq: Two times a day (BID) | ORAL | 3 refills | Status: DC

## 2019-11-02 MED ORDER — DILTIAZEM HCL ER COATED BEADS 240 MG PO CP24: 240.0000 mg | ORAL_CAPSULE | Freq: Every day | ORAL | 3 refills | Status: DC

## 2019-11-02 NOTE — Patient Instructions (Signed)
Medication Instructions:  NO CHANGES *If you need a refill on your cardiac medications before your next appointment, please call your pharmacy*  Lab Work: Your physician recommends that you return for lab work (LIPIDS/LIVER) If you have labs (blood work) drawn today and your tests are completely normal, you will receive your results only by: Marland Kitchen MyChart Message (if you have MyChart) OR . A paper copy in the mail If you have any lab test that is abnormal or we need to change your treatment, we will call you to review the results.  Testing/Procedures: Your physician has requested that you have an exercise tolerance test. For further information please visit [email or web address]. Please also follow instruction sheet, as given.  You will need a Covid Screening 3 days prior to your exercise tolerance test. You will need to self quarantine after the screening until your procedure.This is a Drive Up Visit at the [facility] [address] [place]. Someone will direct you to the appropriate testing line. Stay in your car and someone will be with you shortly  Follow-Up: At [facility], you and your health needs are our priority.  As part of our continuing mission to provide you with exceptional heart care, we have created designated Provider Care Teams.  These Care Teams include your primary Cardiologist (physician) and Advanced Practice Providers (APPs -  Physician Assistants and Nurse Practitioners) who all work together to provide you with the care you need, when you need it.  Your next appointment:   12 month(s) You will receive a reminder letter in the mail two months in advance. If you don't receive a letter, please call our office to schedule the follow-up appointment.  The format for your next appointment:   In Person  Provider:   Minus Breeding, MD

## 2019-11-03 ENCOUNTER — Encounter: Payer: Self-pay | Admitting: Family Medicine

## 2019-11-10 ENCOUNTER — Ambulatory Visit: Payer: BC Managed Care – PPO | Admitting: Family Medicine

## 2019-11-11 ENCOUNTER — Telehealth: Payer: Self-pay | Admitting: Cardiology

## 2019-11-11 NOTE — Telephone Encounter (Signed)
Patient is requesting to reschedule Ett stress test scheduled for 11/18/19. Please call.

## 2019-11-11 NOTE — Telephone Encounter (Signed)
Will route to scheduling to have ETT rescheduled.   Thanks!

## 2019-11-11 NOTE — Telephone Encounter (Signed)
Follow up   Can you please contact and reschedule this appt per the previous message.

## 2019-11-15 ENCOUNTER — Other Ambulatory Visit (HOSPITAL_COMMUNITY): Payer: BC Managed Care – PPO

## 2019-11-17 ENCOUNTER — Ambulatory Visit: Payer: BC Managed Care – PPO | Admitting: Vascular Surgery

## 2019-11-18 ENCOUNTER — Encounter: Payer: Self-pay | Admitting: Endocrinology

## 2019-11-18 ENCOUNTER — Other Ambulatory Visit: Payer: Self-pay

## 2019-11-18 ENCOUNTER — Ambulatory Visit (INDEPENDENT_AMBULATORY_CARE_PROVIDER_SITE_OTHER): Payer: Medicare Other | Admitting: Endocrinology

## 2019-11-18 ENCOUNTER — Ambulatory Visit (HOSPITAL_COMMUNITY)
Admission: RE | Admit: 2019-11-18 | Payer: BC Managed Care – PPO | Source: Ambulatory Visit | Attending: Cardiology | Admitting: Cardiology

## 2019-11-18 VITALS — BP 114/70 | HR 71 | Ht 65.0 in | Wt 179.8 lb

## 2019-11-18 DIAGNOSIS — I2510 Atherosclerotic heart disease of native coronary artery without angina pectoris: Secondary | ICD-10-CM | POA: Diagnosis not present

## 2019-11-18 DIAGNOSIS — E119 Type 2 diabetes mellitus without complications: Secondary | ICD-10-CM | POA: Diagnosis not present

## 2019-11-18 DIAGNOSIS — E1143 Type 2 diabetes mellitus with diabetic autonomic (poly)neuropathy: Secondary | ICD-10-CM

## 2019-11-18 DIAGNOSIS — Z9861 Coronary angioplasty status: Secondary | ICD-10-CM | POA: Diagnosis not present

## 2019-11-18 LAB — POCT GLYCOSYLATED HEMOGLOBIN (HGB A1C): Hemoglobin A1C: 7.2 % — AB (ref 4.0–5.6)

## 2019-11-18 MED ORDER — LINAGLIPTIN 5 MG PO TABS: 5.0000 mg | ORAL_TABLET | Freq: Every day | ORAL | 11 refills | Status: DC

## 2019-11-18 NOTE — Patient Instructions (Addendum)
I have sent a prescription to your pharmacy, to add "Tradjenta."   Please call if this is too expensive, so we can instead increase the repaglinide.   Please continue the same other medications.   check your blood sugar once a day.  vary the time of day when you check, between before the 3 meals, and at bedtime.  also check if you have symptoms of your blood sugar being too high or too low.  please keep a record of the readings and bring it to your next appointment here (or you can bring the meter itself).  You can write it on any piece of paper.  please call us sooner if your blood sugar goes below 70, or if you have a lot of readings over 200. Please come back for a follow-up appointment in 3 months.

## 2019-11-18 NOTE — Progress Notes (Signed)
Subjective:    Patient ID: [name] [name], female    DOB: Feb 15, 1956, 64 y.o.   MRN: [id number]  HPI Pt returns for f/u of diabetes mellitus: DM type: 2 Dx'ed: 8338 Complications: GP, CAD, and CRI.   Therapy: 4 oral meds.   GDM: never DKA: never.  Severe hypoglycemia: never.  Pancreatitis: never.  Other: she has never been on insulin; diarrhea and renal insuff limit metformin dosage; she did not tolerate bromocriptine (vomiting) or victoza (nausea); edema limits pioglitazone dosage.   Interval history: no recent steroids. she says cbg's are well-controlled.  She did not take Rybelsus or Januvia, due to cost.   Past Medical History:  Diagnosis Date  . Accelerating angina ([place]) 11/04/2017  . Asthma   . Chronic pain disorder    neck and shoulder  . CTS (carpal tunnel syndrome) 01/27/2013   right  . Diabetes mellitus type II   . Diabetic gastroparesis ([place])   . Esophageal reflux 08/25/2013   [place], Dr Barth Kirks  . FATTY LIVER DISEASE 04/27/2007   Qualifier: Diagnosis of  By: Danelle Earthly CMA, Darlene    . Focal muscle atrophy 09/26/2015  . Fundic gland polyps of stomach, benign   . HTN (hypertension)   . Hyperlipidemia, mixed 05/04/2007   Qualifier: Diagnosis of  By: Wynona Luna crestor caused myalgias even at low dose Livalo caused myalgias Lipitor  Mother with severe reaction myalgias Simvastatin, Welchol   . Hypertension 08/24/2010  . Hypokalemia 05/25/2013   Improved stopping HCTZ. Was noted to have an elevated glucose when K was low. Recheck renal next week after starting Maxzide  . Hypomagnesemia 08/10/2014  . Hypothyroidism   . Iron deficiency anemia 08/07/2018  . Iron malabsorption 08/07/2018  . Laryngitis 08/25/2017  . Leg cramps 04/15/2016  . Nausea without vomiting 08/08/2014  . NONSPEC ELEVATION OF LEVELS OF TRANSAMINASE/LDH 05/01/2007   Qualifier: Diagnosis of  By: Garner Gavel    . Osteoarthritis    chronic, right knee (11/04/2017)  . OVARIAN CYST 07/11/2009    Qualifier: Diagnosis of  By: Wynona Luna   . Ovarian cyst   . Plantar fasciitis   . Rosacea 10/22/2016  . Urine incontinence     Past Surgical History:  Procedure Laterality Date  . AUGMENTATION MAMMAPLASTY Bilateral 2002  . BLADDER SUSPENSION  1981  . COLONOSCOPY    . CORONARY STENT INTERVENTION N/A 11/04/2017   Procedure: CORONARY STENT INTERVENTION;  Surgeon: Nelva Bush, MD;  Location: [place] [facility];  Service: Cardiovascular;  Laterality: N/A;  . ESOPHAGOGASTRODUODENOSCOPY    . INTRAVASCULAR PRESSURE WIRE/FFR STUDY N/A 11/04/2017   Procedure: INTRAVASCULAR PRESSURE WIRE/FFR STUDY;  Surgeon: Nelva Bush, MD;  Location: [place] [facility];  Service: Cardiovascular;  Laterality: N/A;  . INTRAVASCULAR ULTRASOUND/IVUS N/A 11/04/2017   Procedure: Intravascular Ultrasound/IVUS;  Surgeon: Nelva Bush, MD;  Location: [place] [facility];  Service: Cardiovascular;  Laterality: N/A;  . KNEE ARTHROSCOPY  1998, [id number]  . LAPAROSCOPIC CHOLECYSTECTOMY  2007  . LEFT HEART CATH AND CORONARY ANGIOGRAPHY N/A 11/04/2017   Procedure: LEFT HEART CATH AND CORONARY ANGIOGRAPHY;  Surgeon: Nelva Bush, MD;  Location: [place] [facility];  Service: Cardiovascular;  Laterality: N/A;  . LEFT HEART CATHETERIZATION WITH CORONARY ANGIOGRAM N/A 11/21/2011   Procedure: LEFT HEART CATHETERIZATION WITH CORONARY ANGIOGRAM;  Surgeon: Josue Hector, MD;  Location: [facility];  Service: Cardiovascular;  Laterality: N/A;  . TONSILLECTOMY  1975  . VAGINAL HYSTERECTOMY  1983   "  still have my ovaries"    Social History   Socioeconomic History  . Marital status: Married    Spouse name: Not on file  . Number of children: 1  . Years of education: Not on file  . Highest education level: Not on file  Occupational History  . Occupation: [employer]  Tobacco Use  . Smoking status: Never Smoker  . Smokeless tobacco: Never Used  Vaping Use  . Vaping Use: Never used    Substance and Sexual Activity  . Alcohol use: Not Currently  . Drug use: Never  . Sexual activity: Not Currently    Partners: Male  Other Topics Concern  . Not on file  Social History Narrative   Marital status/children/pets: Married, 1 child.    Education/employment: HS   Safety:      -smoke alarm in the home:Yes     - wears seatbelt: Yes     - Feels safe in their relationships: Yes   Social Determinants of Health   Financial Resource Strain:   . Difficulty of Paying Living Expenses:   Food Insecurity:   . Worried About Charity fundraiser in the Last Year:   . Arboriculturist in the Last Year:   Transportation Needs:   . Film/video editor (Medical):   Marland Kitchen Lack of Transportation (Non-Medical):   Physical Activity:   . Days of Exercise per Week:   . Minutes of Exercise per Session:   Stress:   . Feeling of Stress :   Social Connections:   . Frequency of Communication with Friends and Family:   . Frequency of Social Gatherings with Friends and Family:   . Attends Religious Services:   . Active Member of Clubs or Organizations:   . Attends Archivist Meetings:   Marland Kitchen Marital Status:   Intimate Partner Violence:   . Fear of Current or Ex-Partner:   . Emotionally Abused:   Marland Kitchen Physically Abused:   . Sexually Abused:     Current Outpatient Medications on File Prior to Visit  Medication Sig Dispense Refill  . ACCU-CHEK SMARTVIEW test strip USE TO CHECK BLOOD SUGARS ONCE DAILY 100 strip 12  . aspirin 81 MG tablet Take 81 mg by mouth daily.     . Calcium Carbonate (CALTRATE 600 PO) Take 600 mg by mouth 2 (two) times daily.     Marland Kitchen diltiazem (CARDIZEM CD) 240 MG 24 hr capsule Take 1 capsule (240 mg total) by mouth daily. 90 capsule 3  . famotidine (PEPCID) 20 MG tablet Take 1 tablet (20 mg total) by mouth at bedtime. 90 tablet 3  . FARXIGA 10 MG TABS tablet TAKE 1 TABLET BY MOUTH EVERY DAY 90 tablet 3  . fenofibrate 160 MG tablet Take 1 tablet (160 mg total) by  mouth daily. 90 tablet 3  . Glucosamine-Chondroitin (OSTEO BI-FLEX REGULAR STRENGTH) 250-200 MG TABS Take 1 tablet by mouth 2 (two) times daily.     Javier Docker Oil 350 MG CAPS Take 350 mg by mouth daily.     Marland Kitchen levothyroxine (SYNTHROID) 88 MCG tablet Take one tablet daily on an empty stomach, 2 tabs on Saturday. 102 tablet 3  . Magnesium 250 MG TABS Take 1 tablet by mouth daily.    . metFORMIN (GLUCOPHAGE-XR) 500 MG 24 hr tablet Take 2 tablets (1,000 mg total) by mouth daily. (Patient taking differently: Take 500 mg by mouth daily. ) 180 tablet 0  . metoprolol tartrate (LOPRESSOR) 25  MG tablet Take 1 tablet (25 mg total) by mouth 2 (two) times daily. 180 tablet 3  . nitroGLYCERIN (NITROSTAT) 0.4 MG SL tablet Place 1 tablet (0.4 mg total) under the tongue every 5 (five) minutes as needed for chest pain. 50 tablet 3  . pantoprazole (PROTONIX) 40 MG tablet Take 1 tablet (40 mg total) by mouth 2 (two) times daily. 180 tablet 1  . pioglitazone (ACTOS) 30 MG tablet Take 1 tablet (30 mg total) by mouth daily. 90 tablet 0  . POTASSIUM CITRATE PO Take 99 mg by mouth daily.     . Probiotic Product (PROBIOTIC DAILY PO) Take 99 mg by mouth daily.     . repaglinide (PRANDIN) 1 MG tablet Take 1 tablet (1 mg total) by mouth 3 (three) times daily before meals. 270 tablet 0  . triamterene-hydrochlorothiazide (MAXZIDE-25) 37.5-25 MG tablet Take 0.5 tablets by mouth daily. 45 tablet 1   No current facility-administered medications on file prior to visit.    Allergies  Allergen Reactions  . Epinephrine Other (See Comments)    Pass out   . Repatha [Evolocumab] Nausea And Vomiting  . Statins Other (See Comments)    Myalgias: Simvastatin also caused back ache  . Welchol [Colesevelam Hcl]     myalgia  . Amoxicillin Itching and Rash    Has patient had a PCN reaction causing immediate rash, facial/tongue/throat swelling, SOB or lightheadedness with hypotension: No Has patient had a PCN reaction causing severe rash  involving mucus membranes or skin necrosis: No Has patient had a PCN reaction that required hospitalization: No Has patient had a PCN reaction occurring within the last 10 years: Yes If all of the above answers are "NO", then may proceed with Cephalosporin use.     Family History  Problem Relation Age of Onset  . Alzheimer's disease Mother   . Diabetes type II Mother   . Hiatal hernia Mother   . Diabetes Mother   . Emphysema Father   . COPD Father   . Depression Sister        suicide  . Diabetes Sister   . Diabetes Daughter   . Colon cancer Neg Hx   . Stomach cancer Neg Hx     BP 114/70   Pulse 71   Ht 5\' 5"  (1.651 m)   Wt 179 lb 12.8 oz (81.6 kg)   SpO2 96%   BMI 29.92 kg/m    Review of Systems She denies hypoglycemia    Objective:   Physical Exam VITAL SIGNS:  See vs page GENERAL: no distress Pulses: dorsalis pedis intact bilat.   MSK: no deformity of the feet CV: no leg edema Skin:  no ulcer on the feet.  normal color and temp on the feet. Neuro: sensation is intact to touch on the feet  A1c=7.2%  Lab Results  Component Value Date   CREATININE 1.03 06/15/2019   BUN 21 06/15/2019   NA 137 06/15/2019   K 4.3 06/15/2019   CL 99 06/15/2019   CO2 28 06/15/2019       Assessment & Plan:  Type 2 DM, with CAD: worse.  GP: as Tradjenta mildly slows gastric emptying, we'll try it.   Patient Instructions  I have sent a prescription to your pharmacy, to add "Tradjenta."   Please call if this is too expensive, so we can instead increase the repaglinide.   Please continue the same other medications.   check your blood sugar once a day.  vary the  time of day when you check, between before the 3 meals, and at bedtime.  also check if you have symptoms of your blood sugar being too high or too low.  please keep a record of the readings and bring it to your next appointment here (or you can bring the meter itself).  You can write it on any piece of paper.  please call  us sooner if your blood sugar goes below 70, or if you have a lot of readings over 200. Please come back for a follow-up appointment in 3 months.

## 2019-11-19 LAB — HEPATIC FUNCTION PANEL
ALT: 24 IU/L (ref 0–32)
AST: 24 IU/L (ref 0–40)
Albumin: 4.3 g/dL (ref 3.8–4.8)
Alkaline Phosphatase: 65 IU/L (ref 48–121)
Bilirubin Total: 0.4 mg/dL (ref 0.0–1.2)
Bilirubin, Direct: 0.13 mg/dL (ref 0.00–0.40)
Total Protein: 6.9 g/dL (ref 6.0–8.5)

## 2019-11-19 LAB — LIPID PANEL
Chol/HDL Ratio: 4.4 ratio (ref 0.0–4.4)
Cholesterol, Total: 231 mg/dL — ABNORMAL HIGH (ref 100–199)
HDL: 53 mg/dL (ref >39)
LDL Chol Calc (NIH): 147 mg/dL — ABNORMAL HIGH (ref 0–99)
Triglycerides: 172 mg/dL — ABNORMAL HIGH (ref 0–149)
VLDL Cholesterol Cal: 31 mg/dL (ref 5–40)

## 2019-11-23 ENCOUNTER — Telehealth (HOSPITAL_COMMUNITY): Payer: Self-pay

## 2019-11-23 NOTE — Telephone Encounter (Signed)
Encounter complete. 

## 2019-11-25 ENCOUNTER — Ambulatory Visit (HOSPITAL_COMMUNITY)
Admission: RE | Admit: 2019-11-25 | Discharge: 2019-11-25 | Disposition: A | Discharge status: Home / Self Care | Payer: Medicare Other | Source: Ambulatory Visit | Attending: Cardiology | Admitting: Cardiology

## 2019-11-25 ENCOUNTER — Other Ambulatory Visit: Payer: Self-pay

## 2019-11-25 DIAGNOSIS — R0602 Shortness of breath: Secondary | ICD-10-CM | POA: Diagnosis present

## 2019-11-25 LAB — EXERCISE TOLERANCE TEST
Estimated workload: 6 METS
Exercise duration (min): 4 min
Exercise duration (sec): 10 s
MPHR: 155 {beats}/min
Peak HR: 126 {beats}/min
Percent HR: 81 %
Rest HR: 77 {beats}/min

## 2019-11-30 ENCOUNTER — Other Ambulatory Visit: Payer: Self-pay

## 2019-11-30 ENCOUNTER — Encounter: Payer: Self-pay | Admitting: Endocrinology

## 2019-11-30 MED ORDER — FENOFIBRATE 160 MG PO TABS: 160.0000 mg | ORAL_TABLET | Freq: Every day | ORAL | 1 refills | Status: DC

## 2019-12-01 ENCOUNTER — Other Ambulatory Visit: Payer: Self-pay

## 2019-12-01 ENCOUNTER — Other Ambulatory Visit: Payer: Self-pay | Admitting: Endocrinology

## 2019-12-01 DIAGNOSIS — E1143 Type 2 diabetes mellitus with diabetic autonomic (poly)neuropathy: Secondary | ICD-10-CM

## 2019-12-01 MED ORDER — ACCU-CHEK SOFTCLIX LANCETS MISC: 1.0000 | Freq: Every day | 0 refills | Status: DC

## 2019-12-01 NOTE — Progress Notes (Signed)
refill 

## 2019-12-01 NOTE — Telephone Encounter (Signed)
Please review pt concern regarding cost of Rybelsus, Januvia and [place] and advise

## 2019-12-07 ENCOUNTER — Other Ambulatory Visit: Payer: Self-pay | Admitting: Cardiology

## 2019-12-09 ENCOUNTER — Encounter: Payer: Self-pay | Admitting: Endocrinology

## 2019-12-14 ENCOUNTER — Other Ambulatory Visit: Payer: Self-pay | Admitting: Endocrinology

## 2019-12-14 ENCOUNTER — Other Ambulatory Visit: Payer: Self-pay | Admitting: Family Medicine

## 2019-12-14 DIAGNOSIS — I10 Essential (primary) hypertension: Secondary | ICD-10-CM

## 2019-12-14 MED ORDER — REPAGLINIDE 2 MG PO TABS: 2.0000 mg | ORAL_TABLET | Freq: Two times a day (BID) | ORAL | 3 refills | Status: DC

## 2019-12-15 ENCOUNTER — Encounter: Payer: Self-pay | Admitting: Vascular Surgery

## 2019-12-15 ENCOUNTER — Other Ambulatory Visit: Payer: Self-pay

## 2019-12-15 ENCOUNTER — Ambulatory Visit (INDEPENDENT_AMBULATORY_CARE_PROVIDER_SITE_OTHER): Payer: Medicare Other | Admitting: Vascular Surgery

## 2019-12-15 VITALS — BP 103/68 | HR 68 | Temp 97.3°F | Resp 14 | Ht 65.5 in | Wt 180.0 lb

## 2019-12-15 DIAGNOSIS — I2510 Atherosclerotic heart disease of native coronary artery without angina pectoris: Secondary | ICD-10-CM

## 2019-12-15 DIAGNOSIS — I83813 Varicose veins of bilateral lower extremities with pain: Secondary | ICD-10-CM | POA: Diagnosis not present

## 2019-12-15 DIAGNOSIS — Z9861 Coronary angioplasty status: Secondary | ICD-10-CM | POA: Diagnosis not present

## 2019-12-15 NOTE — Progress Notes (Signed)
Patient name: [name] [name] MRN: [id number] DOB: 22-Apr-1956 Sex: female  REASON FOR CONSULT: Symptomatic varicose veins with pain  HPI: [name] [name] is a 64 y.o. female, who was previously seen in our APP clinic.  She complains of heaviness fullness and aching in her lower extremities.  She occasionally gets some swelling in her legs and feet.  She also complains of numbness and tingling in her feet and lower legs if she sits down for a long period of time.  She also has some mild burning and stinging at the ankle.  She has no history of DVT.  She has been compliant wearing her compression stockings for the last 3 months.  She also now complains of bilateral hip pain.  Patient states that her right leg is the worst leg as far as pain symptoms.  Other medical problems include hypertension diabetes elevated cholesterol all of which have been stable.  Past Medical History:  Diagnosis Date   Accelerating angina ([place]) 11/04/2017   Asthma    Chronic pain disorder    neck and shoulder   CTS (carpal tunnel syndrome) 01/27/2013   right   Diabetes mellitus type II    Diabetic gastroparesis (HCC)    Esophageal reflux 08/25/2013   [place], Dr Barth Kirks   FATTY LIVER DISEASE 04/27/2007   Qualifier: Diagnosis of  By: Danelle Earthly CMA, Darlene     Focal muscle atrophy 09/26/2015   Fundic gland polyps of stomach, benign    HTN (hypertension)    Hyperlipidemia, mixed 05/04/2007   Qualifier: Diagnosis of  By: Wynona Luna crestor caused myalgias even at low dose Livalo caused myalgias Lipitor  Mother with severe reaction myalgias Simvastatin, Welchol    Hypertension 08/24/2010   Hypokalemia 05/25/2013   Improved stopping HCTZ. Was noted to have an elevated glucose when K was low. Recheck renal next week after starting Maxzide   Hypomagnesemia 08/10/2014   Hypothyroidism    Iron deficiency anemia 08/07/2018   Iron malabsorption 08/07/2018   Laryngitis 08/25/2017   Leg cramps  04/15/2016   Nausea without vomiting 08/08/2014   NONSPEC ELEVATION OF LEVELS OF TRANSAMINASE/LDH 05/01/2007   Qualifier: Diagnosis of  By: Garner Gavel     Osteoarthritis    chronic, right knee (11/04/2017)   OVARIAN CYST 07/11/2009   Qualifier: Diagnosis of  By: Wynona Luna    Ovarian cyst    Plantar fasciitis    Rosacea 10/22/2016   Urine incontinence    Past Surgical History:  Procedure Laterality Date   AUGMENTATION MAMMAPLASTY Bilateral 2002   BLADDER SUSPENSION  1981   COLONOSCOPY     CORONARY STENT INTERVENTION N/A 11/04/2017   Procedure: CORONARY STENT INTERVENTION;  Surgeon: Nelva Bush, MD;  Location: [facility];  Service: Cardiovascular;  Laterality: N/A;   ESOPHAGOGASTRODUODENOSCOPY     INTRAVASCULAR PRESSURE WIRE/FFR STUDY N/A 11/04/2017   Procedure: INTRAVASCULAR PRESSURE WIRE/FFR STUDY;  Surgeon: Nelva Bush, MD;  Location: [facility];  Service: Cardiovascular;  Laterality: N/A;   INTRAVASCULAR ULTRASOUND/IVUS N/A 11/04/2017   Procedure: Intravascular Ultrasound/IVUS;  Surgeon: Nelva Bush, MD;  Location: [facility];  Service: Cardiovascular;  Laterality: N/A;   KNEE ARTHROSCOPY  1998, [id number]   LAPAROSCOPIC CHOLECYSTECTOMY  2007   LEFT HEART CATH AND CORONARY ANGIOGRAPHY N/A 11/04/2017   Procedure: LEFT HEART CATH AND CORONARY ANGIOGRAPHY;  Surgeon: Nelva Bush, MD;  Location: [facility];  Service: Cardiovascular;  Laterality: N/A;  LEFT HEART CATHETERIZATION WITH CORONARY ANGIOGRAM N/A 11/21/2011   Procedure: LEFT HEART CATHETERIZATION WITH CORONARY ANGIOGRAM;  Surgeon: Josue Hector, MD;  Location: [facility];  Service: Cardiovascular;  Laterality: N/A;   [place]   "still have my ovaries"    Family History  Problem Relation Age of Onset   Alzheimer's disease Mother    Diabetes type II Mother    Hiatal hernia Mother    Diabetes Mother     Emphysema Father    COPD Father    Depression Sister        suicide   Diabetes Sister    Diabetes Daughter    Colon cancer Neg Hx    Stomach cancer Neg Hx     SOCIAL HISTORY: Social History   Socioeconomic History   Marital status: Married    Spouse name: Not on file   Number of children: 1   Years of education: Not on file   Highest education level: Not on file  Occupational History   Occupation: [employer]  Tobacco Use   Smoking status: Never Smoker   Smokeless tobacco: Never Used  Scientific laboratory technician Use: Never used  Substance and Sexual Activity   Alcohol use: Not Currently   Drug use: Never   Sexual activity: Not Currently    Partners: Male  Other Topics Concern   Not on file  Social History Narrative   Marital status/children/pets: Married, 1 child.    Education/employment: HS   Safety:      -smoke alarm in the home:Yes     - wears seatbelt: Yes     - Feels safe in their relationships: Yes   Social Determinants of Health   Financial Resource Strain:    Difficulty of Paying Living Expenses:   Food Insecurity:    Worried About Charity fundraiser in the Last Year:    Arboriculturist in the Last Year:   Transportation Needs:    Film/video editor (Medical):    Lack of Transportation (Non-Medical):   Physical Activity:    Days of Exercise per Week:    Minutes of Exercise per Session:   Stress:    Feeling of Stress :   Social Connections:    Frequency of Communication with Friends and Family:    Frequency of Social Gatherings with Friends and Family:    Attends Religious Services:    Active Member of Clubs or Organizations:    Attends Archivist Meetings:    Marital Status:   Intimate Partner Violence:    Fear of Current or Ex-Partner:    Emotionally Abused:    Physically Abused:    Sexually Abused:     Allergies  Allergen Reactions   Epinephrine Other (See Comments)    Pass  out    Repatha [Evolocumab] Nausea And Vomiting   Statins Other (See Comments)    Myalgias: Simvastatin also caused back ache   Welchol [Colesevelam Hcl]     myalgia   Amoxicillin Itching and Rash    Has patient had a PCN reaction causing immediate rash, facial/tongue/throat swelling, SOB or lightheadedness with hypotension: No Has patient had a PCN reaction causing severe rash involving mucus membranes or skin necrosis: No Has patient had a PCN reaction that required hospitalization: No Has patient had a PCN reaction occurring within the last 10 years: Yes If all of the above answers are "NO", then  may proceed with Cephalosporin use.     Current Outpatient Medications  Medication Sig Dispense Refill   ACCU-CHEK SMARTVIEW test strip USE TO CHECK BLOOD SUGARS ONCE DAILY 100 strip 12   Accu-Chek Softclix Lancets lancets 1 each by Other route daily. E11.9 100 each 0   aspirin 81 MG tablet Take 81 mg by mouth daily.      Calcium Carbonate (CALTRATE 600 PO) Take 600 mg by mouth 2 (two) times daily.      diltiazem (CARDIZEM CD) 240 MG 24 hr capsule TAKE 1 CAPSULE BY MOUTH EVERY DAY 90 capsule 3   famotidine (PEPCID) 20 MG tablet Take 1 tablet (20 mg total) by mouth at bedtime. 90 tablet 3   fenofibrate 160 MG tablet Take 1 tablet (160 mg total) by mouth daily. 90 tablet 1   Glucosamine-Chondroitin (OSTEO BI-FLEX REGULAR STRENGTH) 250-200 MG TABS Take 1 tablet by mouth 2 (two) times daily.      Krill Oil 350 MG CAPS Take 350 mg by mouth daily.      levothyroxine (SYNTHROID) 88 MCG tablet Take one tablet daily on an empty stomach, 2 tabs on Saturday. 102 tablet 3   Magnesium 250 MG TABS Take 1 tablet by mouth daily.     metFORMIN (GLUCOPHAGE-XR) 500 MG 24 hr tablet Take 2 tablets (1,000 mg total) by mouth daily. (Patient taking differently: Take 500 mg by mouth daily. ) 180 tablet 0   metoprolol tartrate (LOPRESSOR) 25 MG tablet Take 1 tablet (25 mg total) by mouth 2 (two) times  daily. 180 tablet 3   nitroGLYCERIN (NITROSTAT) 0.4 MG SL tablet Place 1 tablet (0.4 mg total) under the tongue every 5 (five) minutes as needed for chest pain. 50 tablet 3   pantoprazole (PROTONIX) 40 MG tablet Take 1 tablet (40 mg total) by mouth 2 (two) times daily. 180 tablet 1   pioglitazone (ACTOS) 30 MG tablet Take 1 tablet (30 mg total) by mouth daily. 90 tablet 0   POTASSIUM CITRATE PO Take 99 mg by mouth daily.      Probiotic Product (PROBIOTIC DAILY PO) Take 99 mg by mouth daily.      repaglinide (PRANDIN) 2 MG tablet Take 1 tablet (2 mg total) by mouth 2 (two) times daily before a meal. 180 tablet 3   triamterene-hydrochlorothiazide (MAXZIDE-25) 37.5-25 MG tablet Take 0.5 tablets by mouth daily. 45 tablet 1   FARXIGA 10 MG TABS tablet TAKE 1 TABLET BY MOUTH EVERY DAY (Patient not taking: Reported on 12/15/2019) 90 tablet 3   No current facility-administered medications for this visit.    ROS:   General:  No weight loss, Fever, chills  HEENT: No recent headaches, no nasal bleeding, no visual changes, no sore throat  Neurologic: No dizziness, blackouts, seizures. No recent symptoms of stroke or mini- stroke. No recent episodes of slurred speech, or temporary blindness.  Cardiac: No recent episodes of chest pain/pressure, no shortness of breath at rest.  No shortness of breath with exertion.  Denies history of atrial fibrillation or irregular heartbeat  Vascular: No history of rest pain in feet.  No history of claudication.  No history of non-healing ulcer, No history of DVT   Pulmonary: No home oxygen, no productive cough, no hemoptysis,  No asthma or wheezing  Musculoskeletal:  [ ]  Arthritis, [ ]  Low back pain,  [ ]  Joint pain  Hematologic:No history of hypercoagulable state.  No history of easy bleeding.  No history of anemia  Gastrointestinal: No hematochezia  or melena,  No gastroesophageal reflux, no trouble swallowing  Urinary: [ ]  chronic Kidney disease, [ ]  on  HD - [ ]  MWF or [ ]  TTHS, [ ]  Burning with urination, [ ]  Frequent urination, [ ]  Difficulty urinating;   Skin: No rashes  Psychological: No history of anxiety,  No history of depression   Physical Examination  Vitals:   12/15/19 1135  BP: 103/68  Pulse: 68  Resp: 14  Temp: (!) 97.3 F (36.3 C)  TempSrc: Temporal  SpO2: 98%  Weight: 180 lb (81.6 kg)  Height: 5' 5.5" (1.664 m)    Body mass index is 29.5 kg/m.  General:  Alert and oriented, no acute distress HEENT: Normal Neck: No JVD Cardiac: Regular Rate and Rhythm Skin: No rash, few scattered spider type varicosities on the anterior thigh bilaterally. Extremity Pulses:  2+ dorsalis pedis, posterior tibial pulses bilaterally Musculoskeletal: No deformity or edema  Neurologic: Upper and lower extremity motor 5/5 and symmetric  DATA:  Patient had a venous reflux exam August 16, 2019.  I reviewed and interpreted the study.  This showed a 4 to 5 mm left greater saphenous vein with diffuse reflux.  Right greater saphenous vein had diffuse reflux but was only about 3 mm diameter.  I also repeated portions of her study with the SonoSite at the bedside today.  This again confirms similar findings to above.  ASSESSMENT: I discussed with the patient today that some of her symptoms may be neuropathic in nature.  However, in the left leg she does have evidence of diffuse reflux in the left greater saphenous vein with dilation of the vein.  I did discuss with her that I did not believe her hip pain was related to venous disease.  Also the burning numbness and tingling is probably more related to her diabetes.  She is going to discuss this further with Dr. Loanne Drilling.  As far as her veins are concerned I believe she would benefit for some improvement of her symptoms of heaviness fullness and aching in the left leg with laser ablation of the left greater saphenous vein.  She will continue to wear compression stockings for the right greater  saphenous vein.  Potentially over time this may dilate up more and require future treatment but currently is not large enough to consider laser ablation.   PLAN: Laser ablation left greater saphenous vein pending insurance approval.  Consideration for laser ablation of the right greater saphenous vein if it comes more dilated more than 4 mm over time.   Ruta Hinds, MD [facility] Office: [phone] Pager: [phone]

## 2019-12-15 NOTE — Telephone Encounter (Signed)
Rx has been sent to the pharmacy electronically. ° °

## 2019-12-20 ENCOUNTER — Encounter: Payer: Self-pay | Admitting: Vascular Surgery

## 2019-12-20 ENCOUNTER — Other Ambulatory Visit: Payer: Self-pay | Admitting: *Deleted

## 2019-12-20 DIAGNOSIS — I83812 Varicose veins of left lower extremities with pain: Secondary | ICD-10-CM

## 2019-12-27 ENCOUNTER — Other Ambulatory Visit: Payer: Self-pay | Admitting: *Deleted

## 2019-12-27 MED ORDER — LORAZEPAM 1 MG PO TABS: ORAL_TABLET | ORAL | 0 refills | Status: DC

## 2019-12-29 ENCOUNTER — Encounter: Payer: Self-pay | Admitting: Vascular Surgery

## 2019-12-29 ENCOUNTER — Other Ambulatory Visit: Payer: Self-pay

## 2019-12-29 ENCOUNTER — Ambulatory Visit (INDEPENDENT_AMBULATORY_CARE_PROVIDER_SITE_OTHER): Payer: Medicare Other | Admitting: Vascular Surgery

## 2019-12-29 VITALS — BP 117/59 | HR 64 | Temp 97.2°F | Resp 14 | Ht 65.5 in | Wt 180.0 lb

## 2019-12-29 DIAGNOSIS — I83812 Varicose veins of left lower extremities with pain: Secondary | ICD-10-CM | POA: Diagnosis not present

## 2019-12-29 HISTORY — PX: ENDOVENOUS ABLATION SAPHENOUS VEIN W/ LASER: SUR449

## 2019-12-29 NOTE — Progress Notes (Signed)
     Laser Ablation Procedure    Date: 12/29/2019   [name] [name] DOB:May 28, 1955  Consent signed: Yes     Surgeon: Ruta Hinds MD   Procedure: Laser Ablation: left Greater Saphenous Vein  BP (!) 117/59 (BP Location: Right Arm, Patient Position: Sitting, Cuff Size: Normal)   Pulse 64   Temp (!) 97.2 F (36.2 C) (Temporal)   Resp 14   Ht 5' 5.5" (1.664 m)   Wt 180 lb (81.6 kg)   SpO2 97%   BMI 29.50 kg/m   Tumescent Anesthesia: 500 cc 0.9% NaCl with 50 cc Lidocaine HCL 1%  and 15 cc 8.4% NaHCO3  Local Anesthesia: 6 cc Lidocaine HCL and NaHCO3 (ratio 2:1)  7 watts continuous mode     Total energy: 1653 Joules    Total time: 236 seconds Treatment Length 30 cm   Laser Fiber Ref. # [id number]      Lot #  [id number]     Patient tolerated procedure well  Notes: Patient wore face mask.  All staff members wore facial masks and facial shields/goggles.  Mrs. Garcia took Ativan 1 mg at 7:30 AM on 12-29-2019.   Description of Procedure:  After marking the course of the secondary varicosities, the patient was placed on the operating table in the supine position, and the left leg was prepped and draped in sterile fashion.   Local anesthetic was administered and under ultrasound guidance the saphenous vein was accessed with a micro needle and guide wire; then the mirco puncture sheath was placed.  A guide wire was inserted saphenofemoral junction , followed by a 5 french sheath.  The position of the sheath and then the laser fiber below the junction was confirmed using the ultrasound.  Tumescent anesthesia was administered along the course of the saphenous vein using ultrasound guidance. The patient was placed in Trendelenburg position and protective laser glasses were placed on patient and staff, and the laser was fired at 7 watts continuous mode for a total of 1653 joules.       Steri strip was applied to the IV insertion site and ABD pads and thigh high compression stockings were  applied.  Ace wrap bandages were applied over the left thigh and at the top of the saphenofemoral junction. Blood loss was less than 15 cc.  Discharge instructions reviewed with patient and hardcopy of discharge instructions given to patient to take home. The patient ambulated out of the operating room having tolerated the procedure well.  Ruta Hinds, MD [facility] Office: [phone]

## 2020-01-05 ENCOUNTER — Encounter: Payer: Self-pay | Admitting: Vascular Surgery

## 2020-01-05 ENCOUNTER — Other Ambulatory Visit: Payer: Self-pay

## 2020-01-05 ENCOUNTER — Other Ambulatory Visit: Payer: Self-pay | Admitting: *Deleted

## 2020-01-05 ENCOUNTER — Ambulatory Visit (HOSPITAL_COMMUNITY)
Admission: RE | Admit: 2020-01-05 | Discharge: 2020-01-05 | Disposition: A | Discharge status: Home / Self Care | Payer: Medicare Other | Source: Ambulatory Visit | Attending: Vascular Surgery | Admitting: Vascular Surgery

## 2020-01-05 ENCOUNTER — Ambulatory Visit (INDEPENDENT_AMBULATORY_CARE_PROVIDER_SITE_OTHER): Payer: Self-pay | Admitting: Vascular Surgery

## 2020-01-05 VITALS — BP 117/72 | HR 65 | Temp 97.2°F | Resp 16

## 2020-01-05 DIAGNOSIS — I83812 Varicose veins of left lower extremities with pain: Secondary | ICD-10-CM | POA: Diagnosis present

## 2020-01-05 DIAGNOSIS — I83813 Varicose veins of bilateral lower extremities with pain: Secondary | ICD-10-CM

## 2020-01-05 NOTE — Progress Notes (Signed)
Patient is a 64 year old female who returns for postoperative follow-up today.  She underwent laser ablation of her left greater saphenous vein December 29, 2019.  She reports minimal pain symptoms at this point.  She has no shortness of breath or chest pain.  She states that her left leg symptoms have completely resolved and she is walking on her left leg without any problems whatsoever.  She still complains of pain aching burning in the right groin to right thigh region especially with walking.  She states this is really limiting her activity overall.  She does have a history of diffuse reflux in the right greater saphenous vein.  Previous duplex ultrasound 5 months ago showed diffuse reflux in the right greater saphenous vein with vein diameter 3-1/2 mm at the knee level dilating up to 8.7 mm at the saphenofemoral junction.  Physical exam:  Vitals:   01/05/20 1025  BP: 117/72  Pulse: 65  Resp: 16  Temp: (!) 97.2 F (36.2 C)  TempSrc: Temporal  SpO2: 98%    Extremities: No significant edema well-healed skin incision left leg  Data: Patient had a post ablation exam of her left leg today which shows good closure of the saphenofemoral junction within 4 mm of the saphenofemoral junction no evidence of DVT  Assessment: Doing well status post laser ablation left greater saphenous vein.  Patient still has right leg symptoms and would like to be considered for laser ablation of the right leg.  Plan: We will schedule the patient for a repeat venous reflux exam of the right leg in the next 1 to 2 weeks.  Depending on vein diameter and reflux findings we would consider laser ablation of the right leg.   This will be a virtual visit.  Ruta Hinds, MD [facility] Office: [phone]

## 2020-01-11 ENCOUNTER — Telehealth: Payer: Self-pay | Admitting: *Deleted

## 2020-01-11 NOTE — Telephone Encounter (Signed)
Returning Mrs. Spader's earlier telephone message regarding "small lump" left lower extremity.  Mrs. Markwood states that last night she noticed a "small lump about the size of a quarter about one and  1/2 inches below my knee cap."  She states that the area is tender to touch and that her left calf is "slightly" larger than her right calf. Ms. Ashraf is s/p endovenous laser ablation left greater saphenous vein by Dr. Oneida Alar on 12-29-2019. She had venous duplex post LA on 01-05-2020 (negative for DVT)  and post op visit with Dr. Oneida Alar on 01-05-2020.  She states she is currently wearing her compression hose and taking Ibuprofen 200 mg with each meal and elevating her left leg when sitting and I encouraged Mrs. Haston to continue these measures.  Advised Mrs. Weingart that I  would confer with Dr. Oneida Alar and call her back.  Mrs. Blum verbalized understanding.

## 2020-01-12 ENCOUNTER — Telehealth: Payer: Self-pay | Admitting: *Deleted

## 2020-01-12 NOTE — Telephone Encounter (Signed)
Following up with [name] [name] about the "small lump" in her left lower extremity. Offered to make her an appointment to see Dr. Oneida Alar and for a venous duplex of her left leg.   [name] [name] states today the lump has not changed in size and is "not hurting" today.   She states she will call me back at end of this week or early next week to follow up.

## 2020-01-19 ENCOUNTER — Encounter (HOSPITAL_COMMUNITY): Payer: Medicare Other

## 2020-01-19 ENCOUNTER — Ambulatory Visit: Payer: Medicare Other

## 2020-01-20 ENCOUNTER — Encounter: Payer: Self-pay | Admitting: Endocrinology

## 2020-01-20 ENCOUNTER — Encounter: Payer: Self-pay | Admitting: *Deleted

## 2020-01-20 DIAGNOSIS — Z006 Encounter for examination for normal comparison and control in clinical research program: Secondary | ICD-10-CM

## 2020-01-20 NOTE — Research (Signed)
Opened in error

## 2020-01-21 ENCOUNTER — Other Ambulatory Visit: Payer: Self-pay

## 2020-01-21 DIAGNOSIS — E1143 Type 2 diabetes mellitus with diabetic autonomic (poly)neuropathy: Secondary | ICD-10-CM

## 2020-01-21 MED ORDER — ACCU-CHEK SMARTVIEW VI STRP: 1.0000 | ORAL_STRIP | Freq: Every day | 0 refills | Status: DC

## 2020-01-21 MED ORDER — ACCU-CHEK SOFTCLIX LANCETS MISC: 1.0000 | Freq: Every day | 0 refills | Status: DC

## 2020-02-04 ENCOUNTER — Encounter: Payer: Self-pay | Admitting: Internal Medicine

## 2020-02-04 ENCOUNTER — Telehealth (INDEPENDENT_AMBULATORY_CARE_PROVIDER_SITE_OTHER): Payer: Medicare Other | Admitting: Internal Medicine

## 2020-02-04 VITALS — BP 120/61 | HR 75 | Temp 96.3°F | Ht 65.5 in | Wt 184.0 lb

## 2020-02-04 DIAGNOSIS — M791 Myalgia, unspecified site: Secondary | ICD-10-CM

## 2020-02-04 DIAGNOSIS — Z9861 Coronary angioplasty status: Secondary | ICD-10-CM | POA: Diagnosis not present

## 2020-02-04 DIAGNOSIS — E785 Hyperlipidemia, unspecified: Secondary | ICD-10-CM

## 2020-02-04 DIAGNOSIS — I2510 Atherosclerotic heart disease of native coronary artery without angina pectoris: Secondary | ICD-10-CM

## 2020-02-04 DIAGNOSIS — T466X5A Adverse effect of antihyperlipidemic and antiarteriosclerotic drugs, initial encounter: Secondary | ICD-10-CM | POA: Diagnosis not present

## 2020-02-04 NOTE — Patient Instructions (Signed)
Medication Instructions:  Your physician has recommended you make the following change in your medication:  -- Start Repatha - This will be a bi-weekly injection - Inject 140 mg subcutaneously every 2 weeks --  Sheral Apley, RN will be contacting you in regards to starting and authorizing this medication *If you need a refill on your cardiac medications before your next appointment, please call your pharmacy*  Lab Work: Your physician recommends that you return for lab work in [place] - Fasting lipid Profile  If you have labs (blood work) drawn today and your tests are completely normal, you will receive your results only by: Marland Kitchen MyChart Message (if you have MyChart) OR . A paper copy in the mail If you have any lab test that is abnormal or we need to change your treatment, we will call you to review the results.  Follow-Up: At [facility], you and your health needs are our priority.  As part of our continuing mission to provide you with exceptional heart care, we have created designated Provider Care Teams.  These Care Teams include your primary Cardiologist (physician) and Advanced Practice Providers (APPs -  Physician Assistants and Nurse Practitioners) who all work together to provide you with the care you need, when you need it.  We recommend signing up for the patient portal called "MyChart".  Sign up information is provided on this After Visit Summary.  MyChart is used to connect with patients for Virtual Visits (Telemedicine).  Patients are able to view lab/test results, encounter notes, upcoming appointments, etc.  Non-urgent messages can be sent to your provider as well.   To learn more about what you can do with MyChart, go to [email or web address].    Your next appointment:   Your physician recommends that you schedule a follow-up appointment in: 4 MONTHS with Dr. Debara Pickett -- Tuesday 06/06/20 at 8:15 am (virtual visit)   The format for your next appointment:   Virtual Visit   with K. Mali Hilty, MD

## 2020-02-04 NOTE — Addendum Note (Signed)
Addended by: Bobby Rumpf C on: 02/04/2020 11:23 AM   Modules accepted: Orders

## 2020-02-04 NOTE — Progress Notes (Signed)
Virtual Visit via Telephone Note   This visit type was conducted due to national recommendations for restrictions regarding the COVID-19 Pandemic (e.g. social distancing) in an effort to limit this patient's exposure and mitigate transmission in our community.  Due to her co-morbid illnesses, this patient is at least at moderate risk for complications without adequate follow up.  This format is felt to be most appropriate for this patient at this time.  The patient did not have access to video technology/had technical difficulties with video requiring transitioning to audio format only (telephone).  All issues noted in this document were discussed and addressed.  No physical exam could be performed with this format.  Please refer to the patient's chart for her  consent to telehealth for [facility].   Date:  02/04/2020   ID:  [name] [name], DOB 20-Apr-1956, MRN [id number] The patient was identified using 2 identifiers.  Evaluation Performed:  New Patient Evaluation  Patient Location:  [place] [address]  Provider location:   [address] [place], [place] [address]  PCP:  Ma Hillock, DO  Cardiologist:  Minus Breeding, MD Electrophysiologist:  None   Chief Complaint:  Manage dyslipdiemia  History of Present Illness:    [name] [name] is a 64 y.o. female who presents via audio/video conferencing for a telehealth visit today.  This is a pleasant 64 year old female patient of Dr. Percival Spanish with a history of coronary artery disease including significant two-vessel stenosis requiring PCI after initial CT coronary angiography.  This was in 2019, since then she has been doing well.  There is a heart disease history in her family.  She also has type 2 diabetes, hypertension and other risk factors.  Unfortunately she has been statin intolerant.  Including also many members in her family.  She was sent to me for evaluation of options, also being labeled as PCSK9  inhibitor intolerant however she says she has never taken an injectable cholesterol medicine.  We had a long discussion about these medications today and she does seem willing to give it a try.  Recent lipids show total cholesterol 231, triglycerides 172, HDL 53 and LDL 147.  Her only lipid medication is fenofibrate 160 mg daily.  The patient does not have symptoms concerning for COVID-19 infection (fever, chills, cough, or new SHORTNESS OF BREATH).    Prior CV studies:   The following studies were reviewed today:  Chart reviewed, lab work  PMHx:  Past Medical History:  Diagnosis Date   Accelerating angina ([place]) 11/04/2017   Asthma    Chronic pain disorder    neck and shoulder   CTS (carpal tunnel syndrome) 01/27/2013   right   Diabetes mellitus type II    Diabetic gastroparesis (HCC)    Esophageal reflux 08/25/2013   [place], Dr Barth Kirks   FATTY LIVER DISEASE 04/27/2007   Qualifier: Diagnosis of  By: Danelle Earthly CMA, Darlene     Focal muscle atrophy 09/26/2015   Fundic gland polyps of stomach, benign    HTN (hypertension)    Hyperlipidemia, mixed 05/04/2007   Qualifier: Diagnosis of  By: Wynona Luna crestor caused myalgias even at low dose Livalo caused myalgias Lipitor  Mother with severe reaction myalgias Simvastatin, Welchol    Hypertension 08/24/2010   Hypokalemia 05/25/2013   Improved stopping HCTZ. Was noted to have an elevated glucose when K was low. Recheck renal next week after starting Maxzide   Hypomagnesemia 08/10/2014  Hypothyroidism    Iron deficiency anemia 08/07/2018   Iron malabsorption 08/07/2018   Laryngitis 08/25/2017   Leg cramps 04/15/2016   Nausea without vomiting 08/08/2014   NONSPEC ELEVATION OF LEVELS OF TRANSAMINASE/LDH 05/01/2007   Qualifier: Diagnosis of  By: Garner Gavel     Osteoarthritis    chronic, right knee (11/04/2017)   OVARIAN CYST 07/11/2009   Qualifier: Diagnosis of  By: Wynona Luna    Ovarian cyst     Plantar fasciitis    Rosacea 10/22/2016   Urine incontinence     Past Surgical History:  Procedure Laterality Date   AUGMENTATION MAMMAPLASTY Bilateral 2002   BLADDER SUSPENSION  1981   COLONOSCOPY     CORONARY STENT INTERVENTION N/A 11/04/2017   Procedure: CORONARY STENT INTERVENTION;  Surgeon: Nelva Bush, MD;  Location: [place];  Service: Cardiovascular;  Laterality: N/A;   ENDOVENOUS ABLATION SAPHENOUS VEIN W/ LASER Left 12/29/2019   endovenous laser ablation left greater saphenous vein by Ruta Hinds MD    ESOPHAGOGASTRODUODENOSCOPY     INTRAVASCULAR PRESSURE WIRE/FFR STUDY N/A 11/04/2017   Procedure: INTRAVASCULAR PRESSURE WIRE/FFR STUDY;  Surgeon: Nelva Bush, MD;  Location: [place] [facility];  Service: Cardiovascular;  Laterality: N/A;   INTRAVASCULAR ULTRASOUND/IVUS N/A 11/04/2017   Procedure: Intravascular Ultrasound/IVUS;  Surgeon: Nelva Bush, MD;  Location: [place];  Service: Cardiovascular;  Laterality: N/A;   KNEE ARTHROSCOPY  1998, [id number]   LAPAROSCOPIC CHOLECYSTECTOMY  2007   LEFT HEART CATH AND CORONARY ANGIOGRAPHY N/A 11/04/2017   Procedure: LEFT HEART CATH AND CORONARY ANGIOGRAPHY;  Surgeon: Nelva Bush, MD;  Location: [facility];  Service: Cardiovascular;  Laterality: N/A;   LEFT HEART CATHETERIZATION WITH CORONARY ANGIOGRAM N/A 11/21/2011   Procedure: LEFT HEART CATHETERIZATION WITH CORONARY ANGIOGRAM;  Surgeon: Josue Hector, MD;  Location: [facility];  Service: Cardiovascular;  Laterality: N/A;   [place]   "still have my ovaries"    FAMHx:  Family History  Problem Relation Age of Onset   Alzheimer's disease Mother    Diabetes type II Mother    Hiatal hernia Mother    Diabetes Mother    Emphysema Father    COPD Father    Depression Sister        suicide   Diabetes Sister    Diabetes Daughter    Colon cancer Neg Hx    Stomach cancer  Neg Hx     SOCHx:   reports that she has never smoked. She has never used smokeless tobacco. She reports previous alcohol use. She reports that she does not use drugs.  ALLERGIES:  Allergies  Allergen Reactions   Epinephrine Other (See Comments)    Pass out    Repatha [Evolocumab] Nausea And Vomiting   Statins Other (See Comments)    Myalgias: Simvastatin also caused back ache   Welchol [Colesevelam Hcl]     myalgia   Amoxicillin Itching and Rash    Has patient had a PCN reaction causing immediate rash, facial/tongue/throat swelling, SOB or lightheadedness with hypotension: No Has patient had a PCN reaction causing severe rash involving mucus membranes or skin necrosis: No Has patient had a PCN reaction that required hospitalization: No Has patient had a PCN reaction occurring within the last 10 years: Yes If all of the above answers are "NO", then may proceed with Cephalosporin use.     MEDS:  Current Meds  Medication Sig  Accu-Chek Softclix Lancets lancets 1 each by Other route daily. E11.9   aspirin 81 MG tablet Take 81 mg by mouth daily.    Calcium Carbonate (CALTRATE 600 PO) Take 600 mg by mouth 2 (two) times daily.    diltiazem (CARDIZEM CD) 240 MG 24 hr capsule TAKE 1 CAPSULE BY MOUTH EVERY DAY   famotidine (PEPCID) 20 MG tablet Take 1 tablet (20 mg total) by mouth at bedtime.   fenofibrate 160 MG tablet Take 1 tablet (160 mg total) by mouth daily.   Glucosamine-Chondroitin (OSTEO BI-FLEX REGULAR STRENGTH) 250-200 MG TABS Take 1 tablet by mouth 2 (two) times daily.    glucose blood (ACCU-CHEK SMARTVIEW) test strip 1 each by Other route daily. E11.9   Krill Oil 350 MG CAPS Take 350 mg by mouth daily.    levothyroxine (SYNTHROID) 88 MCG tablet Take one tablet daily on an empty stomach, 2 tabs on Saturday.   Magnesium 250 MG TABS Take 1 tablet by mouth daily.   metFORMIN (GLUCOPHAGE) 500 MG tablet Take 500 mg by mouth daily.   metoprolol tartrate  (LOPRESSOR) 25 MG tablet Take 1 tablet (25 mg total) by mouth 2 (two) times daily.   nitroGLYCERIN (NITROSTAT) 0.4 MG SL tablet Place 1 tablet (0.4 mg total) under the tongue every 5 (five) minutes as needed for chest pain.   pantoprazole (PROTONIX) 40 MG tablet Take 1 tablet (40 mg total) by mouth 2 (two) times daily.   pioglitazone (ACTOS) 30 MG tablet Take 1 tablet (30 mg total) by mouth daily.   POTASSIUM CITRATE PO Take 99 mg by mouth daily.    Probiotic Product (PROBIOTIC DAILY PO) Take 99 mg by mouth daily.    repaglinide (PRANDIN) 2 MG tablet Take 2 mg by mouth in the morning and at bedtime.     ROS: Pertinent items noted in HPI and remainder of comprehensive ROS otherwise negative.  Labs/Other Tests and Data Reviewed:    Recent Labs: 06/15/2019: BUN 21; Creatinine, Ser 1.03; Hemoglobin 13.9; Magnesium 1.5; Platelets 278.0; Potassium 4.3; Sodium 137; TSH 1.19 11/19/2019: ALT 24   Recent Lipid Panel Lab Results  Component Value Date/Time   CHOL 231 (H) 11/19/2019 08:18 AM   TRIG 172 (H) 11/19/2019 08:18 AM   HDL 53 11/19/2019 08:18 AM   CHOLHDL 4.4 11/19/2019 08:18 AM   CHOLHDL 4 08/25/2017 04:20 PM   LDLCALC 147 (H) 11/19/2019 08:18 AM   LDLDIRECT 151.0 06/01/2015 08:12 AM    Wt Readings from Last 3 Encounters:  02/04/20 184 lb (83.5 kg)  12/29/19 180 lb (81.6 kg)  12/15/19 180 lb (81.6 kg)     Exam:    Vital Signs:  BP 120/61    Pulse 75    Temp (!) 96.3 F (35.7 C)    Ht 5' 5.5" (1.664 m)    Wt 184 lb (83.5 kg)    BMI 30.15 kg/m    Exam deferred due to telephone visit  ASSESSMENT & PLAN:    1. CAD status post two-vessel PCI 2. Mixed dyslipidemia, goal LDL less than 70 3. Statin intolerant-myalgias 4. Type 2 diabetes-A1c 7.2 5. Hypertension 6. Family history of coronary disease  Ms. Wisdom has coronary artery disease with two-vessel PCI in the past.  She has a target LDL less than 70 but has not been able to achieve this due to statin intolerance.   She is also had high triglycerides and is on fenofibrate.  She is a good candidate for PCSK9 inhibitor.  We  discussed the use of it today and she is willing to try it.  I would recommend Repatha 140 mg every 2 weeks.  We will obtain prior authorization.  I discussed the [employer] well [employer] grant.  In addition we will repeat lipids in about 3 to 4 months after approval and she can follow-up with me at that time.  Thanks for the kind referral.  COVID-19 Education: The signs and symptoms of COVID-19 were discussed with the patient and how to seek care for testing (follow up with PCP or arrange E-visit).  The importance of social distancing was discussed today.  Patient Risk:   After full review of this patients clinical status, I feel that they are at least moderate risk at this time.  Time:   Today, I have spent 25 minutes with the patient with telehealth technology discussing dyslipidemia, PCSK9 inhibitors, target LDL, coronary artery disease, dietary modifications.     Medication Adjustments/Labs and Tests Ordered: Current medicines are reviewed at length with the patient today.  Concerns regarding medicines are outlined above.   Tests Ordered: No orders of the defined types were placed in this encounter.   Medication Changes: No orders of the defined types were placed in this encounter.   Disposition:  in 4 month(s)  Pixie Casino, MD, [facility], [place] Director of the Advanced Lipid Disorders &  Cardiovascular Risk Reduction Clinic Diplomate of the American Board of Clinical Lipidology Attending Cardiologist  Direct Dial: [phone]   Fax: [phone]  Website:  [email or web address]  Pixie Casino, MD  02/04/2020 9:06 AM

## 2020-02-07 ENCOUNTER — Other Ambulatory Visit: Payer: Self-pay | Admitting: Internal Medicine

## 2020-02-07 ENCOUNTER — Telehealth: Payer: Self-pay | Admitting: Internal Medicine

## 2020-02-07 MED ORDER — REPATHA SURECLICK 140 MG/ML ~~LOC~~ SOAJ: 1.0000 | SUBCUTANEOUS | 11 refills | Status: DC

## 2020-02-07 NOTE — Telephone Encounter (Signed)
PA for Repatha submitted via [place] (Key: [id number]) PA Case: [id number], Status: Approved, Coverage Starts on: 02/07/2020 12:00:00 AM, Coverage Ends on: 08/05/2020 12:00:00 AM

## 2020-02-09 IMAGING — CR DG CHEST 2V
2 series · 2 of 2 positions shown · non-contrast
Comparison: 06/18/2011

CLINICAL DATA: Chest pressure

EXAM:
CHEST - 2 VIEW

[w chest pa]
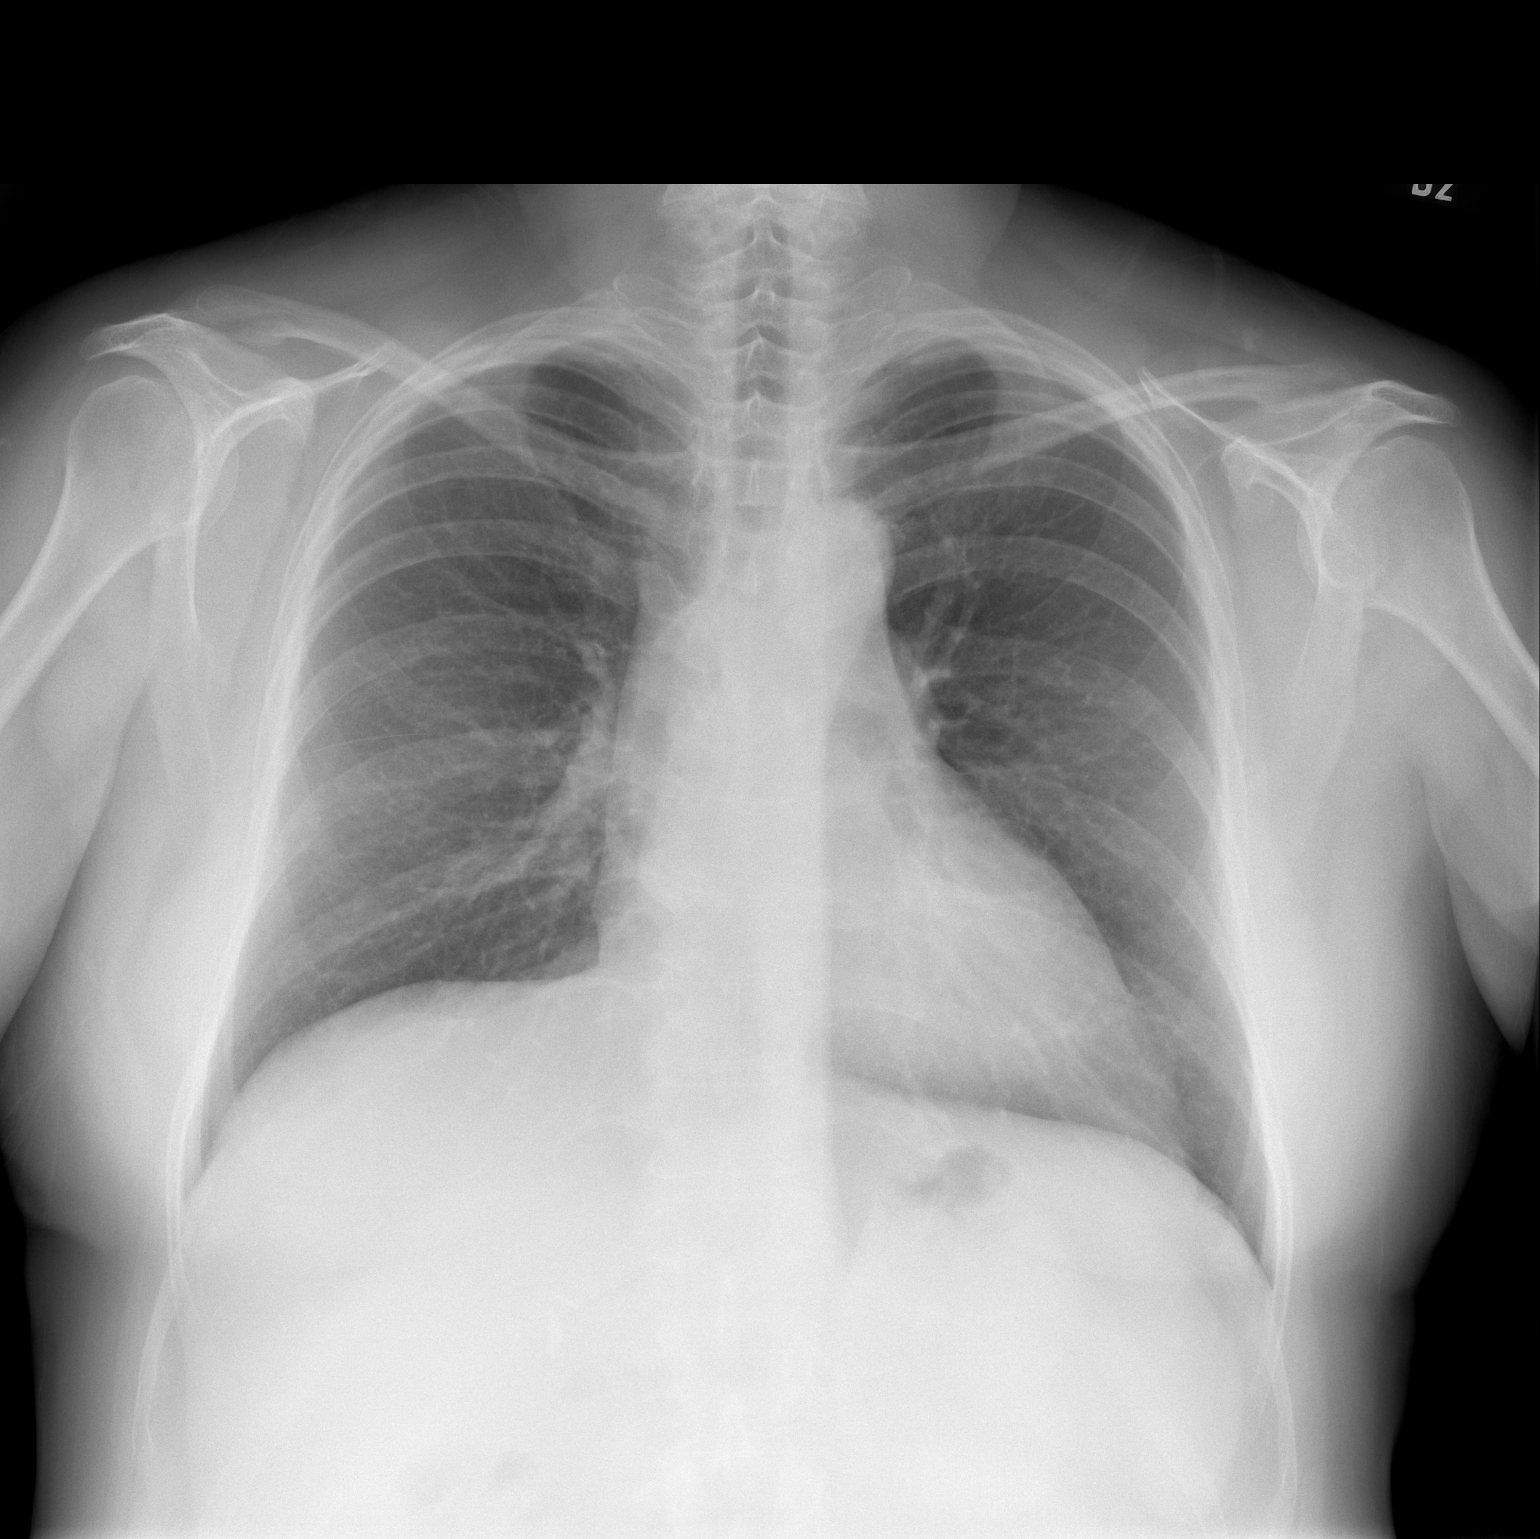

[w chest lat]
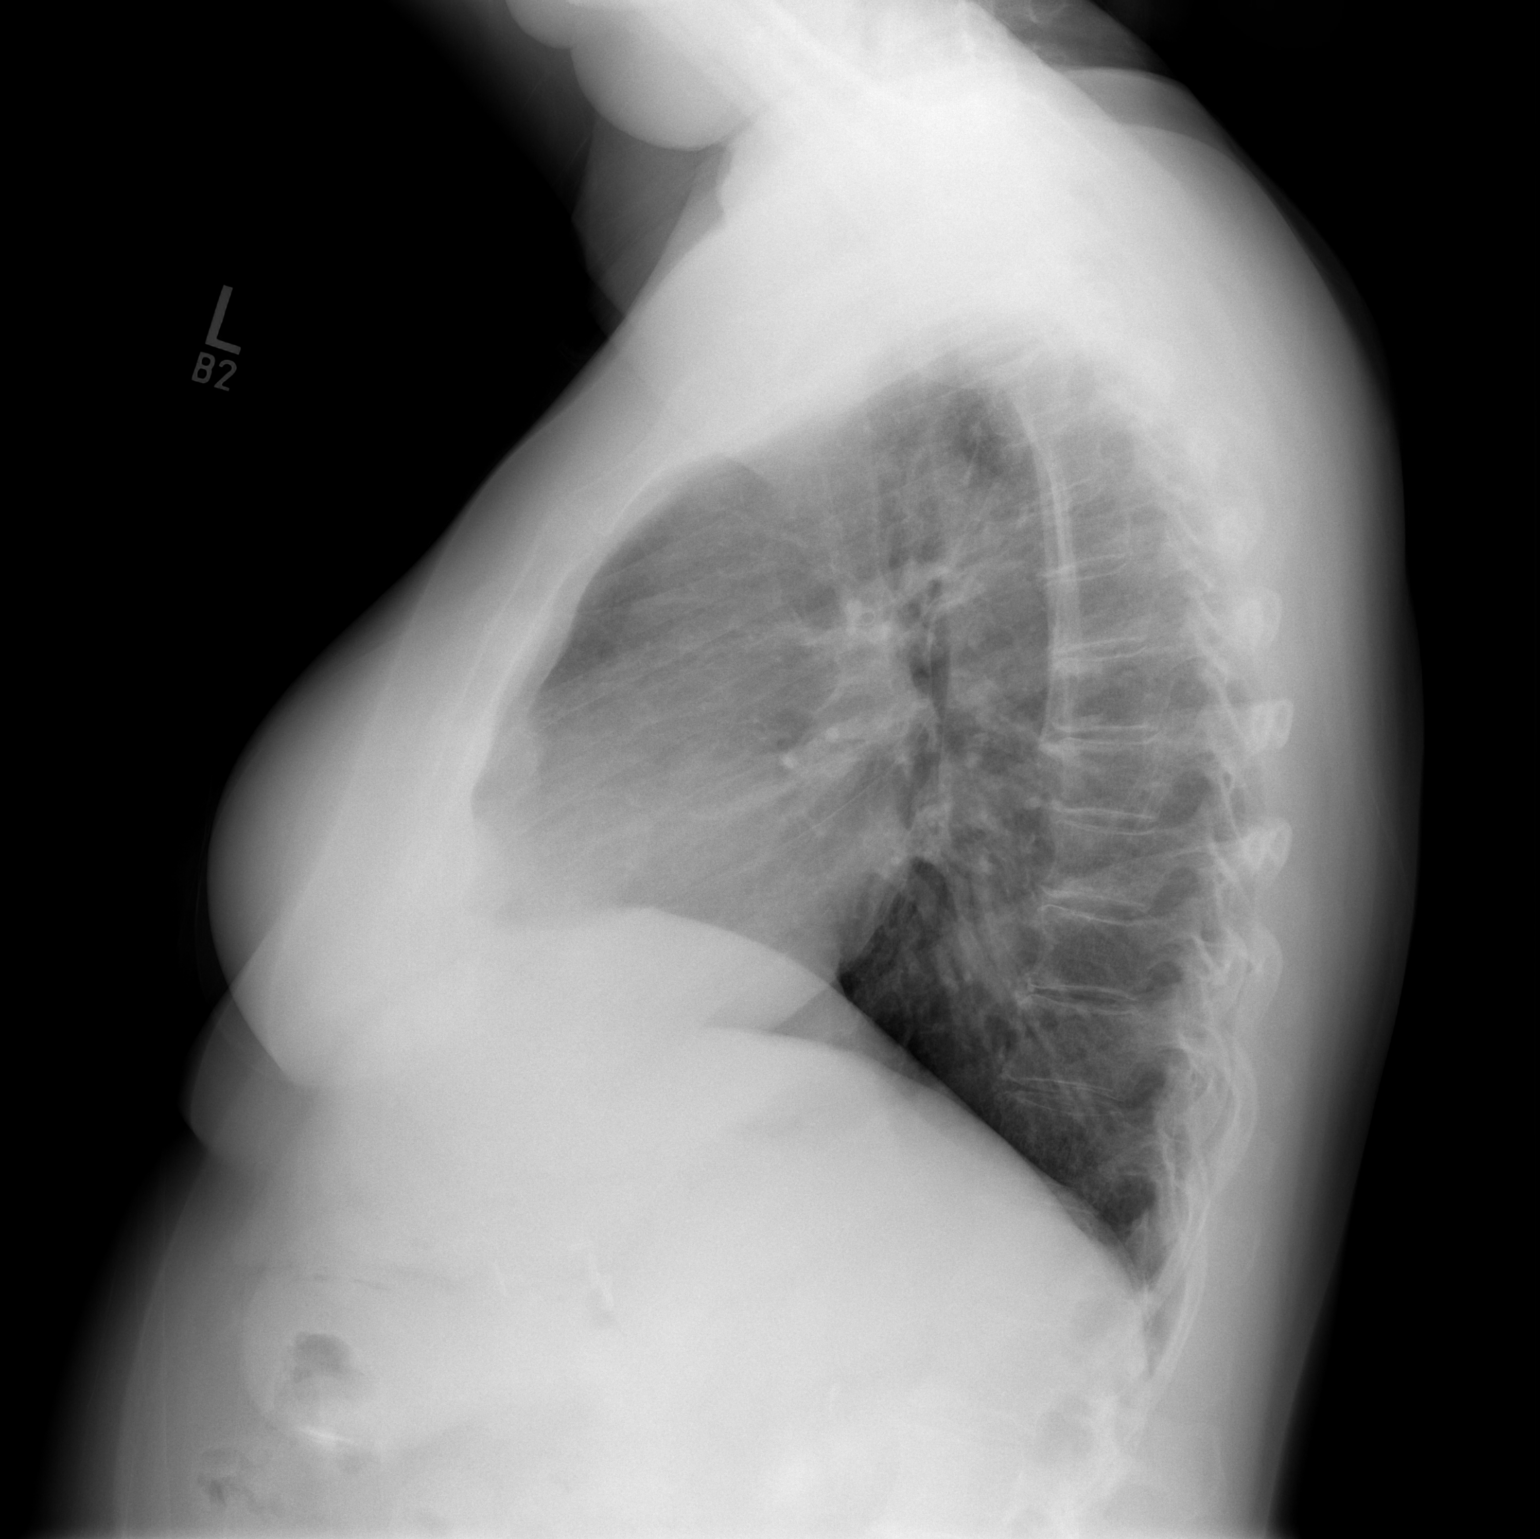

[2 of 2 positions shown; findings below may reference images not displayed]

FINDINGS: Normal heart size. Lungs clear. No pneumothorax. No pleural
effusion.
IMPRESSION: No active cardiopulmonary disease.

## 2020-02-14 ENCOUNTER — Telehealth: Payer: Self-pay | Admitting: Endocrinology

## 2020-02-14 NOTE — Telephone Encounter (Signed)
Patient dropped off form at front desk - placing on your desk.

## 2020-02-14 NOTE — Telephone Encounter (Signed)
Called pt and requested that she provide this documentation since she has it readily available.

## 2020-02-14 NOTE — Telephone Encounter (Signed)
Patient called re: Patient states that [facility] has sent Medicare has sent Medicare Forms for Patient's RX for Accuchek Test Strips (patient has been out of test strips for over a month) on 01/21/20 and 01/28/20 with no response. Patient requests the above be done asap. Patient's ph# [phone] (Patient requests to be called at this ph# for status of the above-patient states she can bring the above mentioned form if Dr. Loanne Drilling has not received the form).

## 2020-02-18 ENCOUNTER — Other Ambulatory Visit: Payer: Self-pay

## 2020-02-18 DIAGNOSIS — E1143 Type 2 diabetes mellitus with diabetic autonomic (poly)neuropathy: Secondary | ICD-10-CM

## 2020-02-18 MED ORDER — ACCU-CHEK SOFTCLIX LANCETS MISC: 1.0000 | Freq: Every day | 3 refills | Status: DC

## 2020-02-23 ENCOUNTER — Ambulatory Visit: Payer: Medicare Other | Admitting: Endocrinology

## 2020-02-25 ENCOUNTER — Other Ambulatory Visit: Payer: Self-pay

## 2020-02-25 ENCOUNTER — Ambulatory Visit (INDEPENDENT_AMBULATORY_CARE_PROVIDER_SITE_OTHER): Payer: Medicare Other | Admitting: Family Medicine

## 2020-02-25 ENCOUNTER — Encounter: Payer: Self-pay | Admitting: Family Medicine

## 2020-02-25 VITALS — BP 119/76 | HR 66 | Temp 98.0°F | Ht 65.5 in | Wt 186.0 lb

## 2020-02-25 DIAGNOSIS — R140 Abdominal distension (gaseous): Secondary | ICD-10-CM

## 2020-02-25 DIAGNOSIS — R194 Change in bowel habit: Secondary | ICD-10-CM | POA: Diagnosis not present

## 2020-02-25 DIAGNOSIS — R109 Unspecified abdominal pain: Secondary | ICD-10-CM

## 2020-02-25 DIAGNOSIS — Z23 Encounter for immunization: Secondary | ICD-10-CM

## 2020-02-25 DIAGNOSIS — R197 Diarrhea, unspecified: Secondary | ICD-10-CM | POA: Diagnosis not present

## 2020-02-25 LAB — MAGNESIUM: Magnesium: 1.6 mg/dL (ref 1.5–2.5)

## 2020-02-25 LAB — COMPREHENSIVE METABOLIC PANEL
ALT: 21 U/L (ref 0–35)
AST: 20 U/L (ref 0–37)
Albumin: 4.4 g/dL (ref 3.5–5.2)
Alkaline Phosphatase: 84 U/L (ref 39–117)
BUN: 22 mg/dL (ref 6–23)
CO2: 29 mEq/L (ref 19–32)
Calcium: 9.8 mg/dL (ref 8.4–10.5)
Chloride: 100 mEq/L (ref 96–112)
Creatinine, Ser: 1.25 mg/dL — ABNORMAL HIGH (ref 0.40–1.20)
GFR: 42.96 mL/min — ABNORMAL LOW (ref >60.00)
Glucose, Bld: 185 mg/dL — ABNORMAL HIGH (ref 70–99)
Potassium: 4.4 mEq/L (ref 3.5–5.1)
Sodium: 138 mEq/L (ref 135–145)
Total Bilirubin: 0.4 mg/dL (ref 0.2–1.2)
Total Protein: 7.1 g/dL (ref 6.0–8.3)

## 2020-02-25 LAB — CBC WITH DIFFERENTIAL/PLATELET
Basophils Absolute: 0 10*3/uL (ref 0.0–0.1)
Basophils Relative: 0.7 % (ref 0.0–3.0)
Eosinophils Absolute: 0.2 10*3/uL (ref 0.0–0.7)
Eosinophils Relative: 2.8 % (ref 0.0–5.0)
HCT: 38.2 % (ref 36.0–46.0)
Hemoglobin: 12.9 g/dL (ref 12.0–15.0)
Lymphocytes Relative: 27.3 % (ref 12.0–46.0)
Lymphs Abs: 1.9 10*3/uL (ref 0.7–4.0)
MCHC: 33.7 g/dL (ref 30.0–36.0)
MCV: 86.2 fl (ref 78.0–100.0)
Monocytes Absolute: 0.5 10*3/uL (ref 0.1–1.0)
Monocytes Relative: 7.8 % (ref 3.0–12.0)
Neutro Abs: 4.2 10*3/uL (ref 1.4–7.7)
Neutrophils Relative %: 61.4 % (ref 43.0–77.0)
Platelets: 278 10*3/uL (ref 150.0–400.0)
RBC: 4.44 Mil/uL (ref 3.87–5.11)
RDW: 13.7 % (ref 11.5–15.5)
WBC: 6.9 10*3/uL (ref 4.0–10.5)

## 2020-02-25 LAB — T4, FREE: Free T4: 0.89 ng/dL (ref 0.60–1.60)

## 2020-02-25 LAB — LIPASE: Lipase: 13 U/L (ref 11.0–59.0)

## 2020-02-25 LAB — C-REACTIVE PROTEIN: CRP: <1 mg/dL (ref 0.5–20.0)

## 2020-02-25 LAB — T3, FREE: T3, Free: 3.1 pg/mL (ref 2.3–4.2)

## 2020-02-25 LAB — TSH: TSH: 3.98 u[IU]/mL (ref 0.35–4.50)

## 2020-02-25 MED ORDER — DICYCLOMINE HCL 10 MG PO CAPS: ORAL_CAPSULE | ORAL | 0 refills | Status: DC

## 2020-02-25 NOTE — Patient Instructions (Signed)

## 2020-02-25 NOTE — Progress Notes (Signed)
This visit occurred during the SARS-CoV-2 public health emergency.  Safety protocols were in place, including screening questions prior to the visit, additional usage of staff PPE, and extensive cleaning of exam room while observing appropriate contact time as indicated for disinfecting solutions.    [name] [name] , December 31, 1955, 64 y.o., female MRN: [id number] Patient Care Team    Relationship Specialty Notifications Start End  Ma Hillock, DO PCP - General Family Medicine  06/03/18   Minus Breeding, MD PCP - Cardiology Cardiology Admissions 11/14/17   Gatha Mayer, MD Consulting Physician Gastroenterology  06/05/18   Marshell Garfinkel, MD Consulting Physician Pulmonary Disease  06/05/18   Renato Shin, MD Consulting Physician Endocrinology  06/05/18     Chief Complaint  Patient presents with  . Diarrhea    pt c/o diarrhea, urgency with stools, bloating, low and mid back pain and weight gain x 1 weeks     Subjective: Pt presents for an OV with complaints of diarrhea x1 week with fecal urgency.  Associated symptoms include abdominal bloating and mid back pain.  She reports she has had weight gain as well over the last week. Patient reports she is having multiple diarrhea stools first thing in the morning, then her stools are normal throughout the day if she has them.  He denies any nausea, vomiting, fever or chills.  She has a feeling of being bloated.  She denies any hematochezia or melena.  She reports that her stools may be lighter in nature than prior for her.  She had a colonoscopy in 2009.  She was unable to tolerate the prep for her repeat colonoscopy with severe nausea and vomiting.  She reports no fevers or chills.  She has not had any medication changes over this time.  She has not had any antibiotics recently.  She has not had any travel or exposure to known sick contacts.  Depression screen [facility] 2/9 06/03/2018 09/06/2014  Decreased Interest 2 0  Down, Depressed, Hopeless 1 0   PHQ - 2 Score 3 0  Altered sleeping 0 -  Tired, decreased energy 2 -  Change in appetite 3 -  Feeling bad or failure about yourself  0 -  Trouble concentrating 0 -  Moving slowly or fidgety/restless 0 -  Suicidal thoughts 0 -  PHQ-9 Score 8 -  Some recent data might be hidden    Allergies  Allergen Reactions  . Epinephrine Other (See Comments)    Pass out   . Repatha [Evolocumab] Nausea And Vomiting  . Statins Other (See Comments)    Myalgias: Simvastatin also caused back ache  . Welchol [Colesevelam Hcl]     myalgia  . Amoxicillin Itching and Rash    Has patient had a PCN reaction causing immediate rash, facial/tongue/throat swelling, SOB or lightheadedness with hypotension: No Has patient had a PCN reaction causing severe rash involving mucus membranes or skin necrosis: No Has patient had a PCN reaction that required hospitalization: No Has patient had a PCN reaction occurring within the last 10 years: Yes If all of the above answers are "NO", then may proceed with Cephalosporin use.    Social History   Social History Narrative   Marital status/children/pets: Married, 1 child.    Education/employment: HS   Safety:      -smoke alarm in the home:Yes     - wears seatbelt: Yes     - Feels safe in their relationships: Yes   Past Medical History:  Diagnosis Date  . Accelerating angina ([place]) 11/04/2017  . Asthma   . Chronic pain disorder    neck and shoulder  . CTS (carpal tunnel syndrome) 01/27/2013   right  . Diabetes mellitus type II   . Diabetic gastroparesis ([place])   . Esophageal reflux 08/25/2013   [place], Dr Barth Kirks  . FATTY LIVER DISEASE 04/27/2007   Qualifier: Diagnosis of  By: Danelle Earthly CMA, Darlene    . Focal muscle atrophy 09/26/2015  . Fundic gland polyps of stomach, benign   . HTN (hypertension)   . Hyperlipidemia, mixed 05/04/2007   Qualifier: Diagnosis of  By: Wynona Luna crestor caused myalgias even at low dose Livalo caused myalgias  Lipitor  Mother with severe reaction myalgias Simvastatin, Welchol   . Hypertension 08/24/2010  . Hypokalemia 05/25/2013   Improved stopping HCTZ. Was noted to have an elevated glucose when K was low. Recheck renal next week after starting Maxzide  . Hypomagnesemia 08/10/2014  . Hypothyroidism   . Iron deficiency anemia 08/07/2018  . Iron malabsorption 08/07/2018  . Laryngitis 08/25/2017  . Leg cramps 04/15/2016  . Nausea without vomiting 08/08/2014  . NONSPEC ELEVATION OF LEVELS OF TRANSAMINASE/LDH 05/01/2007   Qualifier: Diagnosis of  By: Garner Gavel    . Osteoarthritis    chronic, right knee (11/04/2017)  . OVARIAN CYST 07/11/2009   Qualifier: Diagnosis of  By: Wynona Luna   . Ovarian cyst   . Plantar fasciitis   . Rosacea 10/22/2016  . Urine incontinence    Past Surgical History:  Procedure Laterality Date  . AUGMENTATION MAMMAPLASTY Bilateral 2002  . BLADDER SUSPENSION  1981  . COLONOSCOPY    . CORONARY STENT INTERVENTION N/A 11/04/2017   Procedure: CORONARY STENT INTERVENTION;  Surgeon: Nelva Bush, MD;  Location: [place] [facility];  Service: Cardiovascular;  Laterality: N/A;  . ENDOVENOUS ABLATION SAPHENOUS VEIN W/ LASER Left 12/29/2019   endovenous laser ablation left greater saphenous vein by Ruta Hinds MD   . ESOPHAGOGASTRODUODENOSCOPY    . INTRAVASCULAR PRESSURE WIRE/FFR STUDY N/A 11/04/2017   Procedure: INTRAVASCULAR PRESSURE WIRE/FFR STUDY;  Surgeon: Nelva Bush, MD;  Location: [place] [facility];  Service: Cardiovascular;  Laterality: N/A;  . INTRAVASCULAR ULTRASOUND/IVUS N/A 11/04/2017   Procedure: Intravascular Ultrasound/IVUS;  Surgeon: Nelva Bush, MD;  Location: [place] [facility];  Service: Cardiovascular;  Laterality: N/A;  . KNEE ARTHROSCOPY  1998, [id number]  . LAPAROSCOPIC CHOLECYSTECTOMY  2007  . LEFT HEART CATH AND CORONARY ANGIOGRAPHY N/A 11/04/2017   Procedure: LEFT HEART CATH AND CORONARY ANGIOGRAPHY;  Surgeon: Nelva Bush, MD;   Location: [place] [facility];  Service: Cardiovascular;  Laterality: N/A;  . LEFT HEART CATHETERIZATION WITH CORONARY ANGIOGRAM N/A 11/21/2011   Procedure: LEFT HEART CATHETERIZATION WITH CORONARY ANGIOGRAM;  Surgeon: Josue Hector, MD;  Location: [facility];  Service: Cardiovascular;  Laterality: N/A;  . TONSILLECTOMY  1975  . VAGINAL HYSTERECTOMY  1983   "still have my ovaries"   Family History  Problem Relation Age of Onset  . Alzheimer's disease Mother   . Diabetes type II Mother   . Hiatal hernia Mother   . Diabetes Mother   . Emphysema Father   . COPD Father   . Depression Sister        suicide  . Diabetes Sister   . Diabetes Daughter   . Colon cancer Neg Hx   . Stomach cancer Neg Hx    Allergies as of 02/25/2020  Reactions   Epinephrine Other (See Comments)   Pass out    Repatha [evolocumab] Nausea And Vomiting   Statins Other (See Comments)   Myalgias: Simvastatin also caused back ache   Welchol [colesevelam Hcl]    myalgia   Amoxicillin Itching, Rash   Has patient had a PCN reaction causing immediate rash, facial/tongue/throat swelling, SOB or lightheadedness with hypotension: No Has patient had a PCN reaction causing severe rash involving mucus membranes or skin necrosis: No Has patient had a PCN reaction that required hospitalization: No Has patient had a PCN reaction occurring within the last 10 years: Yes If all of the above answers are "NO", then may proceed with Cephalosporin use.      Medication List       Accurate as of February 25, 2020 11:59 PM. If you have any questions, ask your nurse or doctor.        Accu-Chek SmartView test strip Generic drug: glucose blood 1 each by Other route daily. E11.9   Accu-Chek Softclix Lancets lancets 1 each by Other route daily. E11.9   aspirin 81 MG tablet Take 81 mg by mouth daily.   CALTRATE 600 PO Take 600 mg by mouth 2 (two) times daily.   dicyclomine 10 MG capsule Commonly known as: Bentyl 1 tab  before breakfast and 1 tab prior to bed. Started by: Howard Pouch, DO   diltiazem 240 MG 24 hr capsule Commonly known as: CARDIZEM CD TAKE 1 CAPSULE BY MOUTH EVERY DAY   famotidine 20 MG tablet Commonly known as: PEPCID Take 1 tablet (20 mg total) by mouth at bedtime.   fenofibrate 160 MG tablet Take 1 tablet (160 mg total) by mouth daily.   Krill Oil 350 MG Caps Take 350 mg by mouth daily.   levothyroxine 88 MCG tablet Commonly known as: SYNTHROID Take one tablet daily on an empty stomach, 2 tabs on Saturday.   Magnesium 250 MG Tabs Take 1 tablet by mouth daily.   metFORMIN 500 MG tablet Commonly known as: GLUCOPHAGE Take 500 mg by mouth daily.   metoprolol tartrate 25 MG tablet Commonly known as: LOPRESSOR Take 1 tablet (25 mg total) by mouth 2 (two) times daily.   nitroGLYCERIN 0.4 MG SL tablet Commonly known as: NITROSTAT Place 1 tablet (0.4 mg total) under the tongue every 5 (five) minutes as needed for chest pain.   Osteo Bi-Flex Regular Strength 250-200 MG Tabs Generic drug: Glucosamine-Chondroitin Take 1 tablet by mouth 2 (two) times daily.   pantoprazole 40 MG tablet Commonly known as: PROTONIX Take 1 tablet (40 mg total) by mouth 2 (two) times daily.   pioglitazone 30 MG tablet Commonly known as: ACTOS Take 1 tablet (30 mg total) by mouth daily.   POTASSIUM CITRATE PO Take 99 mg by mouth daily.   PROBIOTIC DAILY PO Take 99 mg by mouth daily.   repaglinide 2 MG tablet Commonly known as: PRANDIN Take 2 mg by mouth in the morning and at bedtime.   Repatha SureClick 683 MG/ML Soaj Generic drug: Evolocumab Inject 1 Dose into the skin every 14 (fourteen) days.       All past medical history, surgical history, allergies, family history, immunizations andmedications were updated in the EMR today and reviewed under the history and medication portions of their EMR.     ROS: Negative, with the exception of above mentioned in HPI   Objective:  BP  119/76   Pulse 66   Temp 98 F (36.7 C) (Oral)  Ht 5' 5.5" (1.664 m)   Wt 186 lb (84.4 kg)   SpO2 97%   BMI 30.48 kg/m  Body mass index is 30.48 kg/m. Gen: Afebrile. No acute distress. Nontoxic in appearance, well developed, well nourished.  HENT: AT. [place].  No cough.  No shortness of breath. Eyes:Pupils Equal Round Reactive to light, Extraocular movements intact,  Conjunctiva without redness, discharge or icterus. Neck/lymp/endocrine: Supple, no lymphadenopathy, no thyromegaly CV: RRR , no edema Chest: CTAB, no wheeze or crackles. Good air movement, normal resp effort.  Abd: Soft.  Mild tenderness to palpation over epigastric region.  ND. BS present.  No masses palpated. No rebound or guarding.  Skin: No rashes, purpura or petechiae.  Neuro:  Normal gait. PERLA. EOMi. Alert. Oriented x3  Psych: Normal affect, dress and demeanor. Normal speech. Normal thought content and judgment.  No exam data present No results found. No results found for this or any previous visit (from the past 24 hour(s)).  Assessment/Plan: NAVJOT PILGRIM is a 64 y.o. female present for OV for  Bowel habit changes/diarrhea Oddly spells of diarrhea and urgency only seem to be occurring in the morning, then remainder of the day is normal for her. -We will obtain lab work today to ensure no signs of infection, inflammation, electrolyte disturbances, endocrine disorder, urinary cause and pancreatic enzymes are normal. Trial of Bentyl before bed and before breakfast. Symptoms may be related to her mildly elevated sugars as of recent. - Magnesium - CBC w/Diff - Comp Met (CMET) - C-reactive protein - TSH - T3, free - T4, free - Urinalysis w microscopic + reflex cultur - Lipase Consider referral back to GI if symptoms do not improve. Consider CT scan if symptoms worsen acutely. Follow-up in 2 weeks if symptoms are still present labs do not indicate a cause of her discomfort.   Need for influenza  vaccination - Flu Vaccine QUAD High Dose(Fluad)   Reviewed expectations re: course of current medical issues.  Discussed self-management of symptoms.  Outlined signs and symptoms indicating need for more acute intervention.  Patient verbalized understanding and all questions were answered.  Patient received an After-Visit Summary.    Orders Placed This Encounter  Procedures  . Flu Vaccine QUAD High Dose(Fluad)  . Magnesium  . CBC w/Diff  . Comp Met (CMET)  . C-reactive protein  . TSH  . T3, free  . T4, free  . Urinalysis w microscopic + reflex cultur  . Lipase  . REFLEXIVE URINE CULTURE   Meds ordered this encounter  Medications  . dicyclomine (BENTYL) 10 MG capsule    Sig: 1 tab before breakfast and 1 tab prior to bed.    Dispense:  60 capsule    Refill:  0   Referral Orders  No referral(s) requested today     Note is dictated utilizing voice recognition software. Although note has been proof read prior to signing, occasional typographical errors still can be missed. If any questions arise, please do not hesitate to call for verification.   electronically signed by:  Howard Pouch, DO  [place]

## 2020-02-26 LAB — URINALYSIS W MICROSCOPIC + REFLEX CULTURE
Bacteria, UA: NONE SEEN /HPF
Bilirubin Urine: NEGATIVE
Hgb urine dipstick: NEGATIVE
Hyaline Cast: NONE SEEN /LPF
Ketones, ur: NEGATIVE
Leukocyte Esterase: NEGATIVE
Nitrites, Initial: NEGATIVE
Protein, ur: NEGATIVE
Specific Gravity, Urine: 1.022 (ref 1.001–1.03)
pH: 5.5 (ref 5.0–8.0)

## 2020-02-26 LAB — NO CULTURE INDICATED

## 2020-02-28 ENCOUNTER — Ambulatory Visit: Payer: Medicare Other | Admitting: Family Medicine

## 2020-02-28 ENCOUNTER — Other Ambulatory Visit: Payer: Self-pay | Admitting: Cardiology

## 2020-02-28 ENCOUNTER — Telehealth: Payer: Self-pay | Admitting: Family Medicine

## 2020-02-28 NOTE — Telephone Encounter (Signed)
Please inform patient her labs from Friday do not show an obvious cause for her discomfort. She has mildly decreased kidney function in comparison to prior.  This could possibly be due to diarrhea causing dehydration which would negatively impact the kidneys.  Would encourage her to increase her fluid consumption. Her inflammatory marker was normal. Her CBC did not show signs of infection or anemia. Her thyroid function is normal. Her pancreatic enzyme within normal range. Her sugar was elevated at 185.  Encourage her to take the Bentyl as prescribed.  If symptoms are still present in 2 weeks, or worsening follow-up at that time and we would need to proceed with possible imaging studies and referrals.

## 2020-02-29 ENCOUNTER — Other Ambulatory Visit: Payer: Self-pay

## 2020-02-29 ENCOUNTER — Ambulatory Visit (INDEPENDENT_AMBULATORY_CARE_PROVIDER_SITE_OTHER): Payer: Medicare Other | Admitting: Endocrinology

## 2020-02-29 ENCOUNTER — Encounter: Payer: Self-pay | Admitting: Endocrinology

## 2020-02-29 VITALS — BP 122/64 | HR 62 | Ht 65.5 in | Wt 187.0 lb

## 2020-02-29 DIAGNOSIS — Z9861 Coronary angioplasty status: Secondary | ICD-10-CM | POA: Diagnosis not present

## 2020-02-29 DIAGNOSIS — E1143 Type 2 diabetes mellitus with diabetic autonomic (poly)neuropathy: Secondary | ICD-10-CM | POA: Diagnosis not present

## 2020-02-29 DIAGNOSIS — E119 Type 2 diabetes mellitus without complications: Secondary | ICD-10-CM

## 2020-02-29 DIAGNOSIS — I2510 Atherosclerotic heart disease of native coronary artery without angina pectoris: Secondary | ICD-10-CM

## 2020-02-29 LAB — POCT GLYCOSYLATED HEMOGLOBIN (HGB A1C): Hemoglobin A1C: 8.4 % — AB (ref 4.0–5.6)

## 2020-02-29 MED ORDER — ACCU-CHEK GUIDE VI STRP: 1.0000 | ORAL_STRIP | Freq: Every day | 3 refills | Status: DC

## 2020-02-29 NOTE — Patient Instructions (Addendum)
I have sent a prescription to your pharmacy, for strips, and to add "colesevelam." Please continue the same other medications.   check your blood sugar once a day.  vary the time of day when you check, between before the 3 meals, and at bedtime.  also check if you have symptoms of your blood sugar being too high or too low.  please keep a record of the readings and bring it to your next appointment here (or you can bring the meter itself).  You can write it on any piece of paper.  please call us sooner if your blood sugar goes below 70, or if you have a lot of readings over 200.  Please come back for a follow-up appointment in 2 months.

## 2020-02-29 NOTE — Progress Notes (Signed)
Subjective:    Patient ID: [name] [name], female    DOB: 07/01/1955, 64 y.o.   MRN: [id number]  HPI Pt returns for f/u of diabetes mellitus: DM type: 2 Dx'ed: 4270 Complications: GP, CAD, and CRI.   Therapy: 3 oral meds.   GDM: never DKA: never.  Severe hypoglycemia: never.  Pancreatitis: never.  SDOH: she cannot afford brand name meds Other: she has never been on insulin; diarrhea and renal insuff limit metformin dosage; she did not tolerate bromocriptine (vomiting) or victoza (nausea); edema limits pioglitazone dosage.   Interval history: no recent steroids. she is unable to get test strips.  She cannot tolerate statin rx.   Past Medical History:  Diagnosis Date  . Accelerating angina ([place]) 11/04/2017  . Asthma   . Chronic pain disorder    neck and shoulder  . CTS (carpal tunnel syndrome) 01/27/2013   right  . Diabetes mellitus type II   . Diabetic gastroparesis ([place])   . Esophageal reflux 08/25/2013   [place], Dr Barth Kirks  . FATTY LIVER DISEASE 04/27/2007   Qualifier: Diagnosis of  By: Danelle Earthly CMA, Darlene    . Focal muscle atrophy 09/26/2015  . Fundic gland polyps of stomach, benign   . HTN (hypertension)   . Hyperlipidemia, mixed 05/04/2007   Qualifier: Diagnosis of  By: Wynona Luna crestor caused myalgias even at low dose Livalo caused myalgias Lipitor  Mother with severe reaction myalgias Simvastatin, Welchol   . Hypertension 08/24/2010  . Hypokalemia 05/25/2013   Improved stopping HCTZ. Was noted to have an elevated glucose when K was low. Recheck renal next week after starting Maxzide  . Hypomagnesemia 08/10/2014  . Hypothyroidism   . Iron deficiency anemia 08/07/2018  . Iron malabsorption 08/07/2018  . Laryngitis 08/25/2017  . Leg cramps 04/15/2016  . Nausea without vomiting 08/08/2014  . NONSPEC ELEVATION OF LEVELS OF TRANSAMINASE/LDH 05/01/2007   Qualifier: Diagnosis of  By: Garner Gavel    . Osteoarthritis    chronic, right knee (11/04/2017)  . OVARIAN  CYST 07/11/2009   Qualifier: Diagnosis of  By: Wynona Luna   . Ovarian cyst   . Plantar fasciitis   . Rosacea 10/22/2016  . Urine incontinence     Past Surgical History:  Procedure Laterality Date  . AUGMENTATION MAMMAPLASTY Bilateral 2002  . BLADDER SUSPENSION  1981  . COLONOSCOPY    . CORONARY STENT INTERVENTION N/A 11/04/2017   Procedure: CORONARY STENT INTERVENTION;  Surgeon: Nelva Bush, MD;  Location: [place] [facility];  Service: Cardiovascular;  Laterality: N/A;  . ENDOVENOUS ABLATION SAPHENOUS VEIN W/ LASER Left 12/29/2019   endovenous laser ablation left greater saphenous vein by Ruta Hinds MD   . ESOPHAGOGASTRODUODENOSCOPY    . INTRAVASCULAR PRESSURE WIRE/FFR STUDY N/A 11/04/2017   Procedure: INTRAVASCULAR PRESSURE WIRE/FFR STUDY;  Surgeon: Nelva Bush, MD;  Location: [place] [facility];  Service: Cardiovascular;  Laterality: N/A;  . INTRAVASCULAR ULTRASOUND/IVUS N/A 11/04/2017   Procedure: Intravascular Ultrasound/IVUS;  Surgeon: Nelva Bush, MD;  Location: [place] [facility];  Service: Cardiovascular;  Laterality: N/A;  . KNEE ARTHROSCOPY  1998, [id number]  . LAPAROSCOPIC CHOLECYSTECTOMY  2007  . LEFT HEART CATH AND CORONARY ANGIOGRAPHY N/A 11/04/2017   Procedure: LEFT HEART CATH AND CORONARY ANGIOGRAPHY;  Surgeon: Nelva Bush, MD;  Location: [place] [facility];  Service: Cardiovascular;  Laterality: N/A;  . LEFT HEART CATHETERIZATION WITH CORONARY ANGIOGRAM N/A 11/21/2011   Procedure: LEFT HEART CATHETERIZATION WITH CORONARY ANGIOGRAM;  Surgeon: Josue Hector, MD;  Location: [facility] C[facility] LAB;  Service: Cardiovascular;  Laterality: N/A;  . TONSILLECTOMY  1975  . VAGINAL HYSTERECTOMY  1983   "still have my ovaries"    Social History   Socioeconomic History  . Marital status: Married    Spouse name: Not on file  . Number of children: 1  . Years of education: Not on file  . Highest education level: Not on file  Occupational History  . Occupation:  [employer]  Tobacco Use  . Smoking status: Never Smoker  . Smokeless tobacco: Never Used  Vaping Use  . Vaping Use: Never used  Substance and Sexual Activity  . Alcohol use: Not Currently  . Drug use: Never  . Sexual activity: Not Currently    Partners: Male  Other Topics Concern  . Not on file  Social History Narrative   Marital status/children/pets: Married, 1 child.    Education/employment: HS   Safety:      -smoke alarm in the home:Yes     - wears seatbelt: Yes     - Feels safe in their relationships: Yes   Social Determinants of Health   Financial Resource Strain:   . Difficulty of Paying Living Expenses: Not on file  Food Insecurity:   . Worried About Charity fundraiser in the Last Year: Not on file  . Ran Out of Food in the Last Year: Not on file  Transportation Needs:   . Lack of Transportation (Medical): Not on file  . Lack of Transportation (Non-Medical): Not on file  Physical Activity:   . Days of Exercise per Week: Not on file  . Minutes of Exercise per Session: Not on file  Stress:   . Feeling of Stress : Not on file  Social Connections:   . Frequency of Communication with Friends and Family: Not on file  . Frequency of Social Gatherings with Friends and Family: Not on file  . Attends Religious Services: Not on file  . Active Member of Clubs or Organizations: Not on file  . Attends Archivist Meetings: Not on file  . Marital Status: Not on file  Intimate Partner Violence:   . Fear of Current or Ex-Partner: Not on file  . Emotionally Abused: Not on file  . Physically Abused: Not on file  . Sexually Abused: Not on file    Current Outpatient Medications on File Prior to Visit  Medication Sig Dispense Refill  . aspirin 81 MG tablet Take 81 mg by mouth daily.     . Calcium Carbonate (CALTRATE 600 PO) Take 600 mg by mouth 2 (two) times daily.     Marland Kitchen dicyclomine (BENTYL) 10 MG capsule 1 tab before breakfast and 1 tab prior to  bed. 60 capsule 0  . diltiazem (CARDIZEM CD) 240 MG 24 hr capsule TAKE 1 CAPSULE BY MOUTH EVERY DAY 90 capsule 3  . famotidine (PEPCID) 20 MG tablet Take 1 tablet (20 mg total) by mouth at bedtime. 90 tablet 3  . fenofibrate 160 MG tablet Take 1 tablet (160 mg total) by mouth daily. 90 tablet 1  . Glucosamine-Chondroitin (OSTEO BI-FLEX REGULAR STRENGTH) 250-200 MG TABS Take 1 tablet by mouth 2 (two) times daily.     Javier Docker Oil 350 MG CAPS Take 350 mg by mouth daily.     Marland Kitchen levothyroxine (SYNTHROID) 88 MCG tablet Take one tablet daily on an empty stomach, 2 tabs on Saturday. 102 tablet 3  .  Magnesium 250 MG TABS Take 1 tablet by mouth daily.    . metFORMIN (GLUCOPHAGE) 500 MG tablet Take 500 mg by mouth daily.    . metoprolol tartrate (LOPRESSOR) 25 MG tablet Take 1 tablet (25 mg total) by mouth 2 (two) times daily. 180 tablet 3  . nitroGLYCERIN (NITROSTAT) 0.4 MG SL tablet Place 1 tablet (0.4 mg total) under the tongue every 5 (five) minutes as needed for chest pain. 50 tablet 3  . pantoprazole (PROTONIX) 40 MG tablet Take 1 tablet (40 mg total) by mouth 2 (two) times daily. 180 tablet 1  . pioglitazone (ACTOS) 30 MG tablet Take 1 tablet (30 mg total) by mouth daily. 90 tablet 0  . POTASSIUM CITRATE PO Take 99 mg by mouth daily.     . Probiotic Product (PROBIOTIC DAILY PO) Take 99 mg by mouth daily.     . repaglinide (PRANDIN) 2 MG tablet Take 2 mg by mouth in the morning and at bedtime.    . Evolocumab (REPATHA SURECLICK) 382 MG/ML SOAJ Inject 1 Dose into the skin every 14 (fourteen) days. (Patient not taking: Reported on 02/29/2020) 2 mL 11   No current facility-administered medications on file prior to visit.    Allergies  Allergen Reactions  . Epinephrine Other (See Comments)    Pass out   . Repatha [Evolocumab] Nausea And Vomiting  . Statins Other (See Comments)    Myalgias: Simvastatin also caused back ache  . Welchol [Colesevelam Hcl]     myalgia  . Amoxicillin Itching and Rash     Has patient had a PCN reaction causing immediate rash, facial/tongue/throat swelling, SOB or lightheadedness with hypotension: No Has patient had a PCN reaction causing severe rash involving mucus membranes or skin necrosis: No Has patient had a PCN reaction that required hospitalization: No Has patient had a PCN reaction occurring within the last 10 years: Yes If all of the above answers are "NO", then may proceed with Cephalosporin use.     Family History  Problem Relation Age of Onset  . Alzheimer's disease Mother   . Diabetes type II Mother   . Hiatal hernia Mother   . Diabetes Mother   . Emphysema Father   . COPD Father   . Depression Sister        suicide  . Diabetes Sister   . Diabetes Daughter   . Colon cancer Neg Hx   . Stomach cancer Neg Hx     BP 122/64   Pulse 62   Ht 5' 5.5" (1.664 m)   Wt 187 lb (84.8 kg)   SpO2 97%   BMI 30.65 kg/m    Review of Systems     Objective:   Physical Exam VITAL SIGNS:  See vs page GENERAL: no distress Pulses: dorsalis pedis intact bilat.   MSK: no deformity of the feet CV: no leg edema Skin:  no ulcer on the feet.  normal color and temp on the feet. Neuro: sensation is intact to touch on the feet    Lab Results  Component Value Date   CREATININE 1.25 (H) 02/25/2020   BUN 22 02/25/2020   NA 138 02/25/2020   K 4.4 02/25/2020   CL 100 02/25/2020   CO2 29 02/25/2020   Lab Results  Component Value Date   HGBA1C 8.4 (A) 02/29/2020       Assessment & Plan:  Type 2 DM, with CRI: uncontrolled Dyslipidemia: colesevelam would help this, too.    Patient Instructions  I have sent a prescription to your pharmacy, for strips, and to add "colesevelam." Please continue the same other medications.   check your blood sugar once a day.  vary the time of day when you check, between before the 3 meals, and at bedtime.  also check if you have symptoms of your blood sugar being too high or too low.  please keep a record of the  readings and bring it to your next appointment here (or you can bring the meter itself).  You can write it on any piece of paper.  please call us sooner if your blood sugar goes below 70, or if you have a lot of readings over 200.  Please come back for a follow-up appointment in 2 months.

## 2020-03-02 ENCOUNTER — Other Ambulatory Visit: Payer: Self-pay

## 2020-03-02 DIAGNOSIS — E1143 Type 2 diabetes mellitus with diabetic autonomic (poly)neuropathy: Secondary | ICD-10-CM

## 2020-03-02 MED ORDER — ACCU-CHEK SOFTCLIX LANCETS MISC: 3 refills | Status: DC

## 2020-03-02 NOTE — Telephone Encounter (Signed)
Verified pt understanding  

## 2020-03-03 IMAGING — US US EXTREM  UP VENOUS*R*
1 series · 13 of 24 positions shown · non-contrast
Comparison: None.

CLINICAL DATA: Right upper extremity pain and swelling after
cardiac catheterization via wrist access 1 week ago.



[Series 1: us extrem up venous*right* · 0.12mm/px · 13 of 27 slices shown]
[im 1/27]
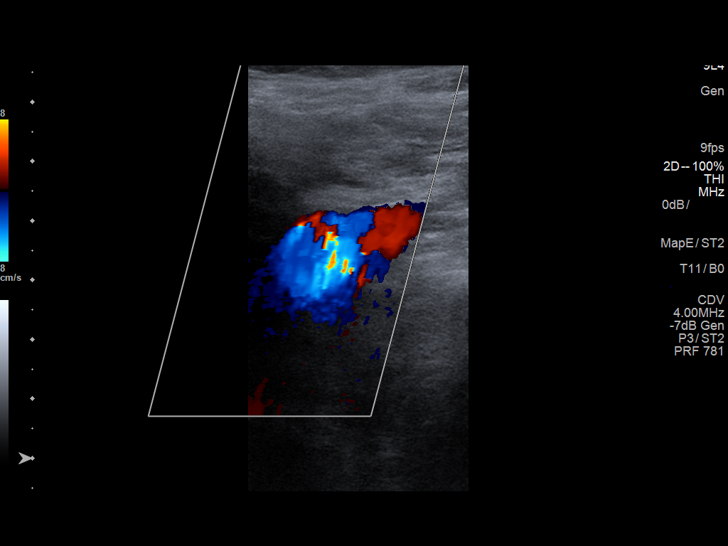
[im 3/27]
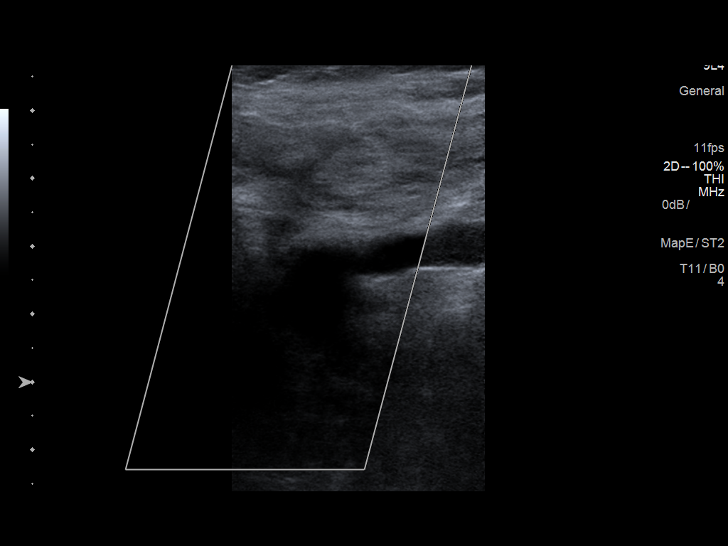
[im 5/27]
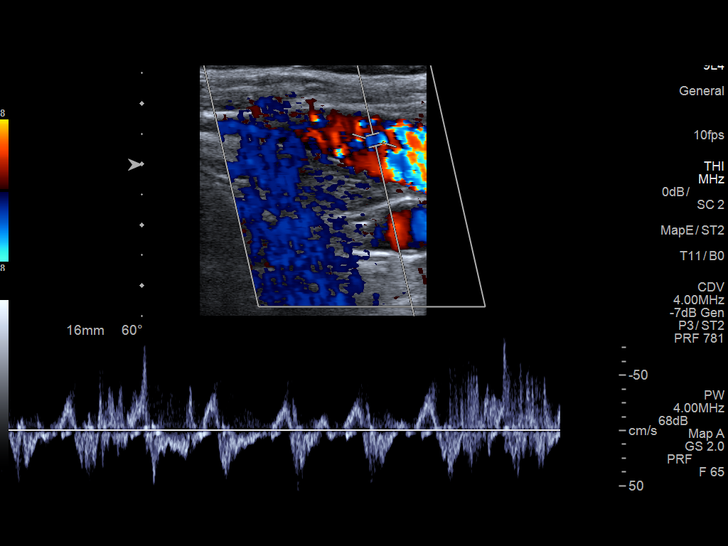
[im 7/27]
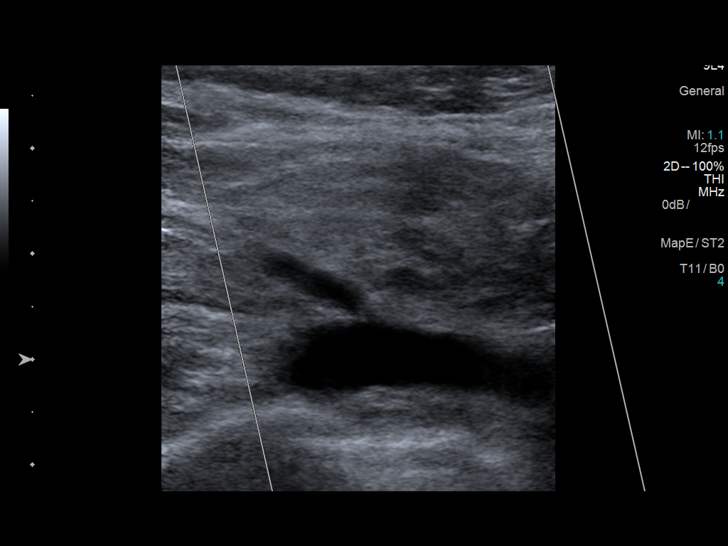
[im 10/27]
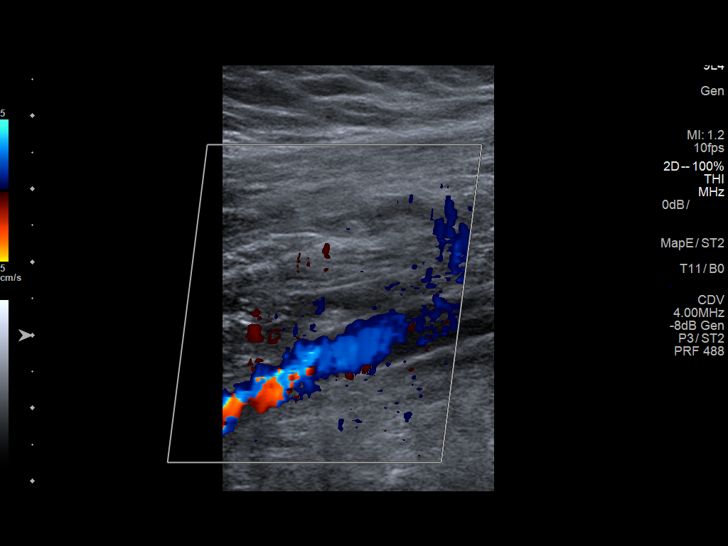
[im 12/27]
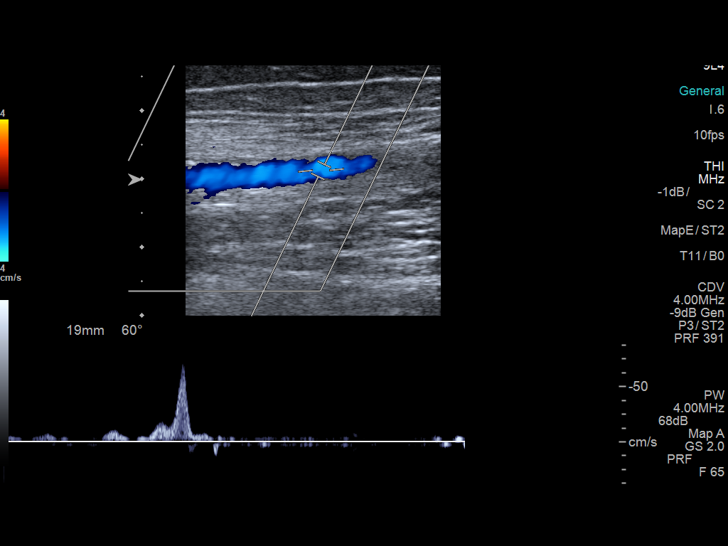
[im 14/27]
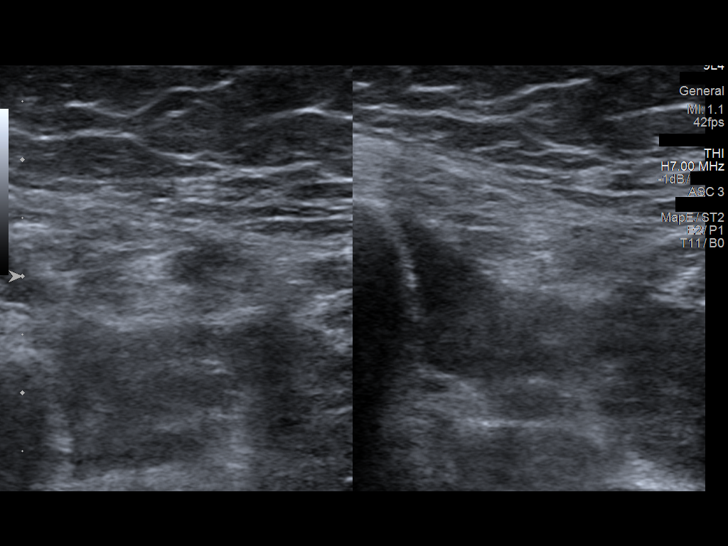
[im 15/27]
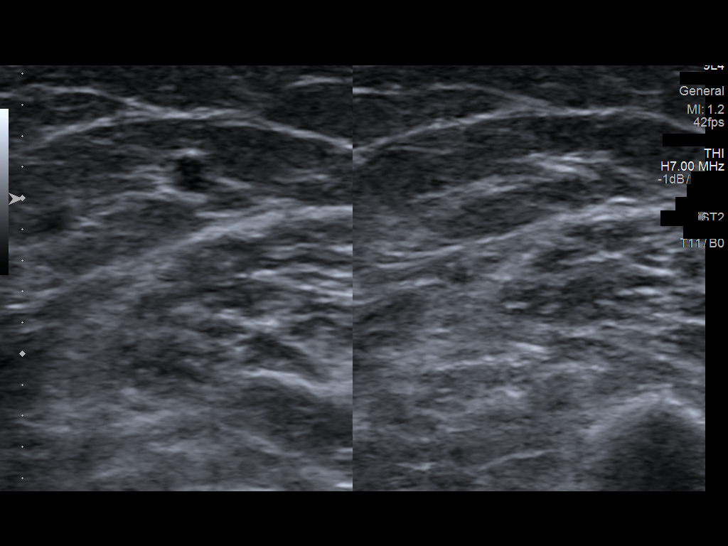
[im 17/27]
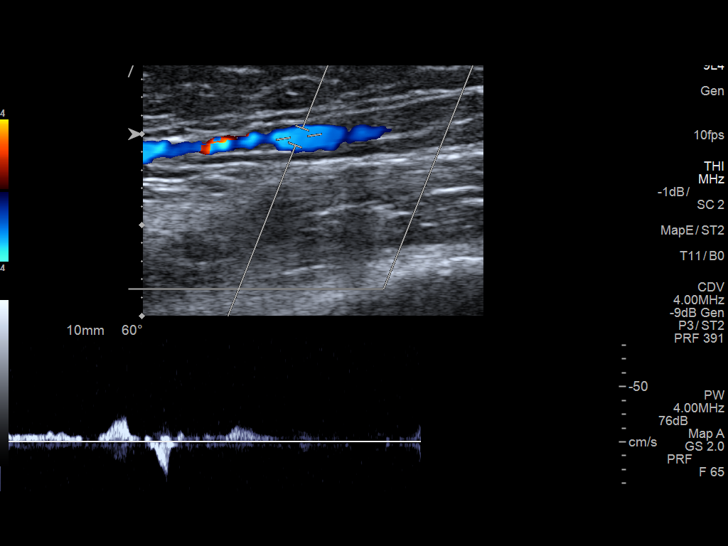
[im 20/27]
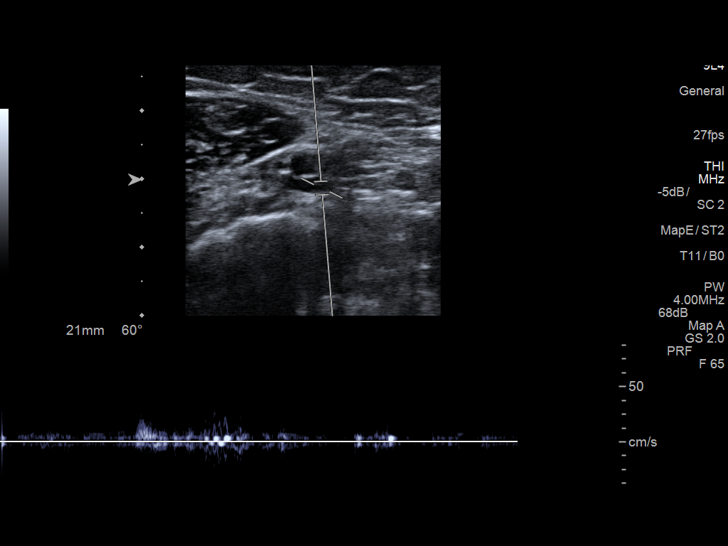
[im 22/27]
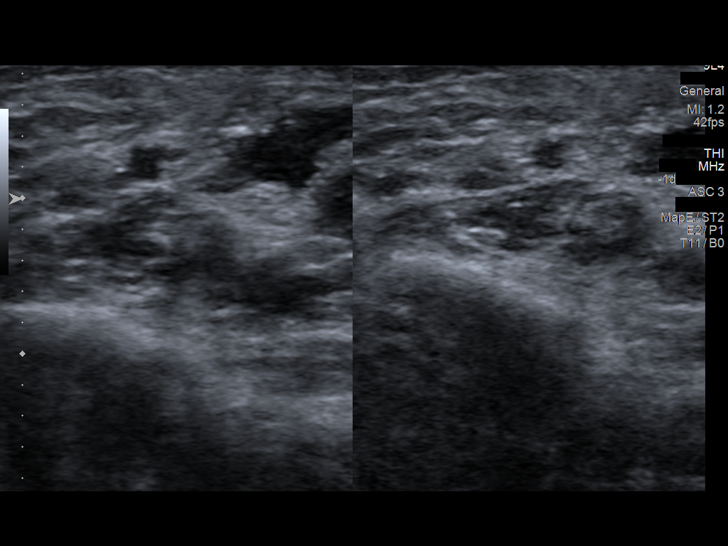
[im 24/27]
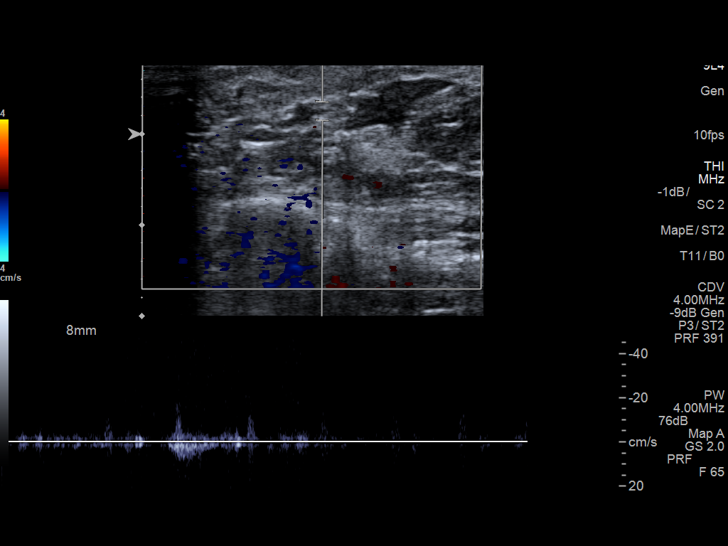
[im 27/27]
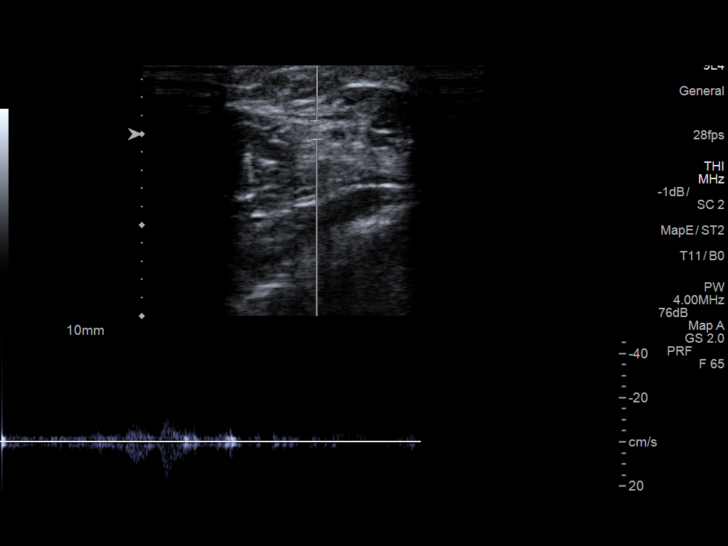

[13 of 24 positions shown; findings below may reference images not displayed]

FINDINGS: Contralateral Subclavian Vein: Respiratory phasicity is normal and
symmetric with the symptomatic side. No evidence of thrombus. Normal
compressibility.

Internal Jugular Vein: No evidence of thrombus. Normal
compressibility, respiratory phasicity and response to augmentation.

Subclavian Vein: No evidence of thrombus. Normal compressibility,
respiratory phasicity and response to augmentation.

Axillary Vein: No evidence of thrombus. Normal compressibility,
respiratory phasicity and response to augmentation.

Cephalic Vein: No evidence of thrombus. Normal compressibility,
respiratory phasicity and response to augmentation.

Basilic Vein: No evidence of thrombus. Normal compressibility,
respiratory phasicity and response to augmentation.

Brachial Veins: No evidence of thrombus. Normal compressibility,
respiratory phasicity and response to augmentation.

Radial Veins: No evidence of thrombus. Normal compressibility,
respiratory phasicity and response to augmentation.

Ulnar Veins: No evidence of thrombus. Normal compressibility,
respiratory phasicity and response to augmentation.

Venous Reflux:  None visualized.

Other Findings:  None visualized.
IMPRESSION: No evidence of DVT within the right upper extremity.

## 2020-03-03 NOTE — Telephone Encounter (Signed)
[name] [name] - would [name] qualify for [employer]? She was approved for Repatha, however, cannot get a grant and cost will be over $300/month - she has CAD with prior intervention, but no history of MI. If she may qualify, please reach out to her.  Thanks,  Dr Debara Pickett

## 2020-03-07 ENCOUNTER — Encounter: Payer: Self-pay | Admitting: Endocrinology

## 2020-03-07 ENCOUNTER — Other Ambulatory Visit: Payer: Self-pay | Admitting: Endocrinology

## 2020-03-07 MED ORDER — COLESEVELAM HCL 625 MG PO TABS: 1250.0000 mg | ORAL_TABLET | Freq: Every day | ORAL | 3 refills | Status: DC

## 2020-03-09 ENCOUNTER — Other Ambulatory Visit: Payer: Self-pay

## 2020-03-09 ENCOUNTER — Other Ambulatory Visit: Payer: Self-pay | Admitting: Internal Medicine

## 2020-03-09 DIAGNOSIS — K219 Gastro-esophageal reflux disease without esophagitis: Secondary | ICD-10-CM

## 2020-03-09 MED ORDER — ACCU-CHEK GUIDE VI STRP: ORAL_STRIP | 3 refills | Status: DC

## 2020-03-09 MED ORDER — ACCU-CHEK SOFTCLIX LANCETS MISC: 3 refills | Status: DC

## 2020-03-12 MED ORDER — COLESEVELAM HCL 625 MG PO TABS
1250.0000 mg | ORAL_TABLET | Freq: Every day | ORAL | 3 refills | Status: DC
Start: 2020-03-12 — End: 2021-09-28

## 2020-03-29 ENCOUNTER — Encounter (HOSPITAL_COMMUNITY): Payer: Medicare Other

## 2020-03-29 ENCOUNTER — Ambulatory Visit: Payer: Medicare Other

## 2020-03-31 ENCOUNTER — Other Ambulatory Visit: Payer: Self-pay

## 2020-03-31 ENCOUNTER — Encounter: Payer: Medicare Other | Admitting: *Deleted

## 2020-03-31 ENCOUNTER — Encounter: Payer: Self-pay | Admitting: *Deleted

## 2020-03-31 VITALS — BP 139/54 | HR 65 | Ht 65.0 in | Wt 146.0 lb

## 2020-03-31 DIAGNOSIS — Z006 Encounter for examination for normal comparison and control in clinical research program: Secondary | ICD-10-CM

## 2020-03-31 NOTE — Research (Signed)
Subject came into research clinic today for [facility] Screening.   Subject Name: [name] [name]  Subject met inclusion and exclusion criteria.  The informed consent form, study requirements and expectations were reviewed with the subject and questions and concerns were addressed prior to the signing of the consent form.  The subject verbalized understanding of the trial requirements.  The subject agreed to participate in the VESALIUS trial and signed the informed consent at 1235 on 03/31/20  The informed consent was obtained prior to performance of any protocol-specific procedures for the subject.  A copy of the signed informed consent was given to the subject and a copy was placed in the subject's medical record.   [name] [name]    Eligibility Criteria Worksheet    AMG [id number]              Site No. [id number]                      Subject ID [id number]---   Code Inclusion Criteria NOTE:The subject is not eligible for the study if any criterion is checked NO  No Yes  These inclusion criteria are to be assessed during the screening period prior to enrollment into the baseline period:  101 Subject has provided informed consent prior to initiation of any study specific activities/procedures _0  _1   102 Adult subjects ? 69 years (men) or ? 75 years (women) to < 21 years of age (either sex) and meeting lipid criteria _2  _3   107 Subjects must have an LDL-C ? 90 mg/dL (? 2.3 mmol/L) OR non-HDL-C ? 120 mg/dL  (? 3.1 mmol/L), OR apolipoprotein B ? 80 mg/dL (? 1.56 ?mol/L)        ?  Lipid entry criteria can be measured up to 3 months prior to screening in               the absence of changes to background therapy         ?  Lipid criteria should be assessed after ? 2 weeks of stable, optimized               lipid-lowering therapy _4  _5   108 Diagnostic evidence of at least 1 of the following (A - D) at screening:  _6  Significant coronary artery disease meeting at least 1 of the following criteria:           ?  History of coronary revascularization with multi-vessel coronary disease               as evidenced by any of the following:                           _7  (a)percutaneous coronary intervention (PCI) of 2 or more vessels,                       including branch arteries                   _8  (b)  PCI or coronary artery bypass grafting (CABG) with residual ?                         50% stenosis in a separate, unrevascularized vessel, or               _9  (c) multi-vessel CABG 5 years or more prior to screening      _10   Significant coronary disease without prior revascularization as evidenced             by either a ? 70% stenosis of at least 1 coronary artery, ? 50% stenosis             of 2 or more coronary arteries,  or ? 50% stenosis of the left main              coronary artery      _0  known coronary artery calcium score ? 100 in subjects without a coronary             artery revascularization prior to randomization _1  B. Significant atherosclerotic cerebrovascular disease meeting at least 1 of           the following criteria:         _2   prior transient ischemic attack with ? 50% carotid stenosis      _3  internal or external carotid artery stenosis of ? 70% or 2 or more ? 50%               stenoses      _4  prior internal or external carotid artery revascularization _5  C. Significant peripheral arterial disease meeting at least 1 of the following             criteria:       _6  ? 50% stenosis in a limb artery       _7  history of abdominal aorta treatment (percutaneous and surgical) due to            atherosclerosic disease      _8  ankle brachial index (ABI) < 0.85 _9  D. Diabetes mellitus with at least 1 of the following:       _10  known microvascular disease, defined by diabetic nephropathy or treated            retinopathy. Diabetic nephropathy defined as persistent microalbuminuria           (urinary albumin to creatinine ratio ? 30 mg/g) and/or persistent estimated            glomerular filtration rate  (eGFR) < 60 mL/min/1.73 m2 that is not            reversible due to an acute illness      _11  chronic daily treatment with an intermediate or long-acting insulin      _12  diabetes diagnosis ? 10 years ago  _13   _14   109 At least 1 of the following high risk criteria (most recent lab values within 6 months prior to screening, as applicable): <WUJWJXBJYNWGNFAO>_1<\/HYQMVHQIONGEXBMW>_41  polyvascular disease, defined as coronary, carotid, or peripheral artery       stenosis ? 50% in a second distinct vascular location in a patient with       coronary, cerebral or peripheral arterial disease (inclusion criterion 108 A-C)  _16  presence of either diabetes mellitus or metabolic syndrome (see Section 12.9       of the protocol) in a subject with coronary, cerebral, or peripheral artery       disease (inclusion criterion 108 A-C) _17  at least 1 coronary, carotid, or peripheral artery residual stenosis of ? 50% in      a patient with diabetes meeting inclusion criterion 108 D _18  LDL-C ? 130 mg/dL (? 3.36 mmol/L), OR non-HDL-C ? 160 mg/dL (? 4.14      mmol/L), OR apolipoprotein B ? 120 mg/dL (2.3 ?mol/L) if available _19  lipoprotein (a) > 125 nmol/L (50  mg/dL _0  known familial hypercholesterolemia  _1  family history of premature coronary artery disease defined as an MI or CABG      in the subject's father or brother at age < 4 years or an MI or CABG in the      subject's mother or sister at  age < 59 years _2  hsCRP ? 3.0 mg/L in the absence of an acute illness _3  current tobacco use  _1  ? 65 years of age  _5  menopause before 65 years of age  _6  eGFR 15 to < 45 mL/min/1.73 m2  _7  coronary artery calcification score ? 300 in a patient without a coronary       revascularization  prior to randomization      _8  _9   106 FOR [place] ONLY: Subject affiliated to a social security scheme _10  _11   Exclusion Criteria NOTE: The subject is not eligible for the study if any criterion is checked YES.  Disease Related   201 MI  or stroke prior to randomization.  _12  _13   202 CABG < 3 months prior to screening  _14  _15   203 Uncontrolled or recurrent ventricular tachycardia in the absence of an implantable-cardioverter defibrillator. _16  _17   204 Atrial fibrillation not on anticoagulation therapy (vitamin K antagonist, heparin, low-molecular weight heparin, fondaparinux, or non-vitamin K antagonist oral anticoagulant) _18  _19   206 Last measured left-ventricular ejection fraction < 30% or New York Heart Association (NYHA) Functional Class III/IV. _20  _21   219 Planned arterial revascularization _22  _23   Diagnostic Assessments  207 Fasting triglycerides ? 500 mg/dL (5.7 mmol/L) at screening _24  _25   208 End stage renal disease (ESRD), defined as an eGFR < 15 mL/min/1.73 m2 or receiving dialysis  at screening _26  _27   Other Medical Conditions  209 Malignancy (except non-melanoma skin cancers, cervical in situ carcinoma, breast ductal carcinoma in situ, or stage 1 prostate carcinoma) within the last 5 years prior to day 1 _28  _29   210 History or evidence of clinically significant disease (eg, malignancy, respiratory, gastrointestinal, renal or psychiatric disease) or unstable disorder that, in the opinion of the investigator(s), [employer] physician or designee would pose a risk to the patient's safety or interfere with the study assessments, procedures, completion, or result in a life expectancy of less than 1 year _30  _31      220 Persistent acute liver disease or hepatic dysfunction, defined as Child Pugh score of C  (see Appendix 12.11 of the protocol) _32  _33   Prior/Concomitant Therapy  212 Previously received a cholesterol ester transfer protein (CETP) inhibitor (ie, anacetrapib, dalcetrapib, evacetrapib), mipomersen, lomitapide, or has undergone LDL-apheresis in the last  12 months prior to LDL-C screening _34  _35   221 Previously received or receiving any other therapy to inhibit PCSK9 in the following timeframe  prior to screening:  ?   bococizumab at any time  ? evolocumab, alirocumab, or any other monoclonal antibody against PCSK9      within 3 months  ? inclisiran within 12 months _36  _37   Prior/Concurrent Clinical Study Experience  213 Currently receiving treatment in another investigational device or drug study, or less than 30 days since ending treatment on another investigational device or drug study(ies). _38  _39   Other Exclusions  32 Female subjects of childbearing potential unwilling to use 1 acceptable method of effective contraception during treatment and for an additional 15 weeks after the last dose of investigational product. Refer to Section 12.5 of the protocol for additional contraceptive information. _40  _41   216 Subject has known sensitivity to any  of the products or components to be administered during dosing _0  _1   217 Subject likely to not be available to complete all protocol-required study visits or procedures, and/or to comply with all required study procedures to the best of the subject and investigator's knowledge _2  _3   218 Subject is staff personnel directly involved with the study or is a family member of the investigational study staff _4  _5   43 Female subject is pregnant, had a positive pregnancy test at screening (by a serum pregnancy test and/or urine pregnancy test), breastfeeding, or planning to become pregnant or breastfeed during treatment and for an additional 15 weeks after the last dose of investigational product. _6  _7     999 Subject met eligibility criteria but did not enroll _8  _9   Protocol Amendment 2.0, Protocol Date: 24-Jul-2018; [place] Supplement Version 1.0, Protocol Date: 10-Dec-2017 Worksheet Date: 27-Aug-2018 Document Title: [id number] Eligibility Criteria Worksheet Template v 2.0 Approved 18-Feb-201   [facility]-CV Screening Visit  Patient Name: [name] [name] Subject ID# [id number]  Visit Date/Informed Consent Date November 5th, 2021  Demographics      Sex: Female   Ethnicity: Not Hispanic or Latino  Age: 64 Race _10 White _11 Black or African American _12 Asian _13 American [place] and [place] Native _14 Native Hawaiian _15 Other Pacific Islander  Tobacco use  _16 never _17 current _18 former Type  _19 Cigarettes _20 Cigars _21 E-cigarettes _22 Smokeless  Quantity and unit ____ Frequency  _23 Daily _24 Every week _25 Every month _26 Occasional _27 Other Duration ____  _28 Days _29 Weeks _30 Months _31 Years  Reproductive Status  _32 Childbearing potentially (Primary method of birth control ____)      (Date of last menstrual period____) (Breastfeeding?____) _33 postmenopausal _34 Surgically sterile _35 Infertile _36 other  Current Statin ___  Statin intolerance If the subject is not on a high-intensity statin, please indicate the primary reason. _37 Not indicated per local professional society guidelines _38 Demonstrated intolerance _39 Subject refusal _40 other  If demonstrated intolerance, provide statin(s) used: Rosuvastatin, Simvastatin  Select symptom related to statin use _41 Myalgia _42 Increase Creatine Kinase _43 Increase in liver function enzymes _44 Other (muscle aches with simvastatin)  Local Labs   Collection Date: November 5th, 2021 Patient Fasting   _45 Yes   _46 No _47 Urine pregnancy test _48 Chemistry _49 Fasting lipid panel - was able to use chemistry from beginning of October 2021  Placebo injection Box # [id number] Administration time 1:30 _50 AM _51 PM in clinic Quantity Administered    _52 None _53 Partial _54 Full Reason for None or Partial Dose ____ Administration Site _55 Abdomen _56 Arm _57 Thigh Laterality _58 Left  _59 Right Administered by _60 Patient _61  Research Coordinator     After signing consent, local labs were drawn to collect the lipid profile we will be able to use Chemistry from beginning of October 2021 since it is within 6 months of screening. IP was taken out of refrigerator at 1300 and was given to subject subcutaneously in right upper arm at 1330. Subject remained in lab  for 30 minutes after injection for monitoring of any adverse events. Subject tolerated shot well. Subject left research clinic at 1400.

## 2020-04-01 ENCOUNTER — Encounter: Payer: Self-pay | Admitting: Endocrinology

## 2020-04-01 ENCOUNTER — Other Ambulatory Visit: Payer: Self-pay

## 2020-04-01 DIAGNOSIS — E1143 Type 2 diabetes mellitus with diabetic autonomic (poly)neuropathy: Secondary | ICD-10-CM

## 2020-04-01 LAB — LIPID PANEL
Chol/HDL Ratio: 4.9 ratio — ABNORMAL HIGH (ref 0.0–4.4)
Cholesterol, Total: 228 mg/dL — ABNORMAL HIGH (ref 100–199)
HDL: 47 mg/dL (ref >39)
LDL Chol Calc (NIH): 119 mg/dL — ABNORMAL HIGH (ref 0–99)
Triglycerides: 353 mg/dL — ABNORMAL HIGH (ref 0–149)
VLDL Cholesterol Cal: 62 mg/dL — ABNORMAL HIGH (ref 5–40)

## 2020-04-01 MED ORDER — PIOGLITAZONE HCL 30 MG PO TABS: 30.0000 mg | ORAL_TABLET | Freq: Every day | ORAL | 1 refills | Status: DC

## 2020-04-02 ENCOUNTER — Other Ambulatory Visit: Payer: Self-pay | Admitting: Family Medicine

## 2020-04-03 NOTE — Research (Signed)
Patient seen in a Vesalius screening visit.  Has been seen in Lipid clinic by Dr. Debara Pickett, but unable to obtain affordable PCSK-9.  Patient sees Dr. Percival Spanish.  Stable at present time.  Inclusion and exclusion criteria reviewed, and nature of study, randomization discussed with patient in detail.  She was referred to the trial by the trial's PI Dr. Debara Pickett.   Brief Exam:  Normal appearing WF No carotid bruits Lungs clear to auscultation. Cardiac rhythm regular without murmurs No evidence of extremity edema.   Roll in and randomization process to follow.   [name] [name]. Lia Foyer, MD, [place] Director, [facility] Cardiovascular [facility]

## 2020-04-12 ENCOUNTER — Other Ambulatory Visit: Payer: Self-pay

## 2020-04-12 ENCOUNTER — Encounter: Payer: Medicare Other | Admitting: *Deleted

## 2020-04-12 VITALS — BP 137/77 | HR 66

## 2020-04-12 DIAGNOSIS — Z006 Encounter for examination for normal comparison and control in clinical research program: Secondary | ICD-10-CM

## 2020-04-12 NOTE — Research (Signed)
Vesalius-CV Day 1 Visit-Randomization  Patient Name [name] [name] Subject ID# [id number] Randomization# [id number]  Visit Date 04/12/20   IP Dispensation: Date dispensed 04/12/20 Number dispensed- total: 8 Number dispensed home to patient: 7 Box # [id number] Box # [id number]  IP Administration in Clinic: Box # [id number] Administration time 12:20 [] AM [x] PM in clinic Quantity Administered    [] None [] Partial [x] Full Reason for None or Partial Dose ____ Administration Site [x] Abdomen [] Arm [] Thigh Laterality [x] Left  [] Right Administered by [x] Patient [] Caregiver   Patient provided with: Subject welcome letter, subject wallet card, subject informational brochure, instructions for use, reference guide, white IP storage box and tote bag.    Subject came to research clinic today for VESALIUS Randomization/Day 1 Visit. No new AE's/SAE's to report. IP was removed from the refrigerator in pharmacy at 11:50am, subject gave herself the injection subcutaneously into her left abdomen at 12:20pm. Subject remained in clinic for 30 minutes after injection, subject tolerated well. She left the research clinic at 12:50pm. Next appointment scheduled for Wednesday, March 9th, 2022 @ 11:30.

## 2020-05-02 ENCOUNTER — Ambulatory Visit (INDEPENDENT_AMBULATORY_CARE_PROVIDER_SITE_OTHER): Payer: Medicare Other | Admitting: Endocrinology

## 2020-05-02 ENCOUNTER — Other Ambulatory Visit: Payer: Self-pay

## 2020-05-02 ENCOUNTER — Encounter: Payer: Self-pay | Admitting: Endocrinology

## 2020-05-02 VITALS — BP 128/72 | HR 69 | Ht 65.5 in | Wt 187.2 lb

## 2020-05-02 DIAGNOSIS — N189 Chronic kidney disease, unspecified: Secondary | ICD-10-CM | POA: Diagnosis not present

## 2020-05-02 DIAGNOSIS — E119 Type 2 diabetes mellitus without complications: Secondary | ICD-10-CM

## 2020-05-02 DIAGNOSIS — Z9861 Coronary angioplasty status: Secondary | ICD-10-CM | POA: Diagnosis not present

## 2020-05-02 DIAGNOSIS — E1143 Type 2 diabetes mellitus with diabetic autonomic (poly)neuropathy: Secondary | ICD-10-CM

## 2020-05-02 DIAGNOSIS — I2510 Atherosclerotic heart disease of native coronary artery without angina pectoris: Secondary | ICD-10-CM

## 2020-05-02 DIAGNOSIS — E1122 Type 2 diabetes mellitus with diabetic chronic kidney disease: Secondary | ICD-10-CM

## 2020-05-02 LAB — POCT GLYCOSYLATED HEMOGLOBIN (HGB A1C): Hemoglobin A1C: 7.8 % — AB (ref 4.0–5.6)

## 2020-05-02 MED ORDER — ACARBOSE 25 MG PO TABS: 25.0000 mg | ORAL_TABLET | Freq: Three times a day (TID) | ORAL | 3 refills | Status: DC

## 2020-05-02 NOTE — Patient Instructions (Addendum)
I have sent a prescription to your pharmacy, for strips, and to add "acarbose."  Please continue the same other medications.   check your blood sugar once a day.  vary the time of day when you check, between before the 3 meals, and at bedtime.  also check if you have symptoms of your blood sugar being too high or too low.  please keep a record of the readings and bring it to your next appointment here (or you can bring the meter itself).  You can write it on any piece of paper.  please call us sooner if your blood sugar goes below 70, or if you have a lot of readings over 200.  Please come back for a follow-up appointment in 3 months.

## 2020-05-02 NOTE — Progress Notes (Addendum)
   Subjective:    Patient ID: [name] [name], female    DOB: 1956-02-18, 64 y.o.   MRN: [id number]  HPI Pt returns for f/u of diabetes mellitus: DM type: 2 Dx'ed: 3276 Complications: GP, CAD, and stage 3 CRI.   Therapy: 4 oral meds.   GDM: never DKA: never.  Severe hypoglycemia: never.  Pancreatitis: never.  SDOH: she cannot afford brand name meds; she is unable to get test strips. Other: she has never been on insulin; diarrhea and renal insuff limit metformin dosage; she did not tolerate bromocriptine (vomiting) or victoza (nausea); edema limits pioglitazone dosage; she cannot tolerate statin rx.      Interval history: no recent steroids. She takes meds as rx'ed.     Review of Systems She denies hypoglycemia    Objective:   Physical Exam VITAL SIGNS:  See vs page GENERAL: no distress Pulses: dorsalis pedis intact bilat.   MSK: no deformity of the feet CV: no leg edema Skin:  no ulcer on the feet.  normal color and temp on the feet. Neuro: sensation is intact to touch on the feet.     Lab Results  Component Value Date   CREATININE 1.25 (H) 02/25/2020   BUN 22 02/25/2020   NA 138 02/25/2020   K 4.4 02/25/2020   CL 100 02/25/2020   CO2 29 02/25/2020       Assessment & Plan:  Type 2 DM, with CRI: uncontrolled.    Patient Instructions  I have sent a prescription to your pharmacy, for strips, and to add "acarbose."  Please continue the same other medications.   check your blood sugar once a day.  vary the time of day when you check, between before the 3 meals, and at bedtime.  also check if you have symptoms of your blood sugar being too high or too low.  please keep a record of the readings and bring it to your next appointment here (or you can bring the meter itself).  You can write it on any piece of paper.  please call us sooner if your blood sugar goes below 70, or if you have a lot of readings over 200.  Please come back for a follow-up appointment in 3 months.

## 2020-05-31 ENCOUNTER — Other Ambulatory Visit: Payer: Self-pay

## 2020-05-31 DIAGNOSIS — K219 Gastro-esophageal reflux disease without esophagitis: Secondary | ICD-10-CM

## 2020-05-31 MED ORDER — PANTOPRAZOLE SODIUM 40 MG PO TBEC: 40.0000 mg | DELAYED_RELEASE_TABLET | Freq: Two times a day (BID) | ORAL | 1 refills | Status: DC

## 2020-05-31 MED ORDER — FAMOTIDINE 20 MG PO TABS: 20.0000 mg | ORAL_TABLET | Freq: Every day | ORAL | 1 refills | Status: DC

## 2020-05-31 NOTE — Telephone Encounter (Signed)
Pantoprazole, famotidine sent to patient's new mail order pharmacy--[facility].

## 2020-06-01 ENCOUNTER — Telehealth: Payer: Self-pay

## 2020-06-01 ENCOUNTER — Other Ambulatory Visit: Payer: Self-pay

## 2020-06-01 DIAGNOSIS — E039 Hypothyroidism, unspecified: Secondary | ICD-10-CM

## 2020-06-01 MED ORDER — LEVOTHYROXINE SODIUM 88 MCG PO TABS: ORAL_TABLET | ORAL | 1 refills | Status: DC

## 2020-06-01 MED ORDER — FENOFIBRATE 160 MG PO TABS: 160.0000 mg | ORAL_TABLET | Freq: Every day | ORAL | 1 refills | Status: DC

## 2020-06-01 NOTE — Telephone Encounter (Signed)
noted 

## 2020-06-01 NOTE — Addendum Note (Signed)
Addended by: Paschal Dopp on: 06/01/2020 03:56 PM   Modules accepted: Orders

## 2020-06-01 NOTE — Telephone Encounter (Signed)
Patient returned call to verify [facility]. Stated she changed at the beginning of 2022.

## 2020-06-01 NOTE — Telephone Encounter (Signed)
Called pt to verify which pharmacy she is using. Received refill request from [facility]

## 2020-06-06 ENCOUNTER — Telehealth: Payer: Medicare Other | Admitting: Internal Medicine

## 2020-06-06 ENCOUNTER — Other Ambulatory Visit: Payer: Self-pay

## 2020-06-06 MED ORDER — METOPROLOL TARTRATE 25 MG PO TABS: 25.0000 mg | ORAL_TABLET | Freq: Two times a day (BID) | ORAL | 3 refills | Status: DC

## 2020-06-06 MED ORDER — DILTIAZEM HCL ER COATED BEADS 240 MG PO CP24: ORAL_CAPSULE | ORAL | 3 refills | Status: DC

## 2020-06-19 ENCOUNTER — Telehealth: Payer: Medicare Other | Admitting: Internal Medicine

## 2020-06-21 ENCOUNTER — Ambulatory Visit: Payer: Medicare Other | Admitting: Internal Medicine

## 2020-06-27 ENCOUNTER — Encounter: Payer: Self-pay | Admitting: Endocrinology

## 2020-06-28 ENCOUNTER — Other Ambulatory Visit: Payer: Self-pay | Admitting: *Deleted

## 2020-06-28 DIAGNOSIS — E1143 Type 2 diabetes mellitus with diabetic autonomic (poly)neuropathy: Secondary | ICD-10-CM

## 2020-06-28 MED ORDER — PIOGLITAZONE HCL 30 MG PO TABS: 30.0000 mg | ORAL_TABLET | Freq: Every day | ORAL | 1 refills | Status: DC

## 2020-07-02 ENCOUNTER — Encounter: Payer: Self-pay | Admitting: Endocrinology

## 2020-07-04 ENCOUNTER — Encounter: Payer: Self-pay | Admitting: Family Medicine

## 2020-07-04 DIAGNOSIS — E119 Type 2 diabetes mellitus without complications: Secondary | ICD-10-CM

## 2020-07-04 NOTE — Telephone Encounter (Signed)
May place referral to new endocrinologist for her to manage her diabetes. - Dr. Chalmers Cater please.

## 2020-07-05 NOTE — Addendum Note (Signed)
Addended by: Kem Boroughs D on: 07/05/2020 10:02 AM   Modules accepted: Orders

## 2020-07-13 ENCOUNTER — Telehealth: Payer: Self-pay | Admitting: Internal Medicine

## 2020-07-13 NOTE — Telephone Encounter (Signed)
PA for repatha sureclick submitted via CMM (Key: [id number])

## 2020-07-13 NOTE — Telephone Encounter (Signed)
PA for repatha denied. Rationale for denial not provided in [facility] - will await fax

## 2020-07-20 NOTE — Telephone Encounter (Signed)
Patient is in clinical trial, states receiving repatha. Will disregard PA request/denial

## 2020-08-02 ENCOUNTER — Other Ambulatory Visit: Payer: Self-pay

## 2020-08-02 DIAGNOSIS — Z006 Encounter for examination for normal comparison and control in clinical research program: Secondary | ICD-10-CM

## 2020-08-02 NOTE — Research (Signed)
Vesalius-CV Week 16 Visit  Patient Name [name] [name] Subject ID#   Visit Date 08/02/20  Non-Fatal Potential Endpoint Assessment Yes  No   Has the subject experienced/undergone any of the following since the last visit/contact?  []  [x]   Any Coronary Artery Revascularization/Cerebrovascular Revascularization/ Peripheral Artery Revascularization/Amputation Procedure  []  [x]   Myocardial Infarction []  [x]   Stroke  []  x  Provide the date for the non-fatal Potential Endpoints status:  []  [x]     Treatment compliance: Date returned: 08/02/20 Number returned from box# 4 returned from [id number] : Number returned from box# 3 returned from [id number] : Number returned-total:   7 Number of Full administrations: 7  Number of partial administrations: 0 Number of non administrations: 0 Number of unknown administrations: 0  Reason for any partial or non administrations: N/A Number due to non compliance: N/A Number due to device complaint: N/A Number due to other: N/A  IP Dispensation: Date dispensed 08/02/20 Number dispensed- total: 8 Number dispensed home to patient: 7 Box # [id number] Box # [id number]  IP Administration in Clinic: Box # [id number] Administration time 1106 [x] AM [] PM in clinic Quantity Administered    [] None [] Partial [x] Full Reason for None or Partial Dose ____ Administration Site [x] Abdomen [] Arm [] Thigh Laterality [] Left  [x] Right Administered by [] Patient [x] Caregiver

## 2020-08-09 ENCOUNTER — Ambulatory Visit: Payer: Medicare Other | Admitting: Endocrinology

## 2020-08-26 ENCOUNTER — Encounter (HOSPITAL_BASED_OUTPATIENT_CLINIC_OR_DEPARTMENT_OTHER): Payer: Self-pay | Admitting: *Deleted

## 2020-08-26 ENCOUNTER — Other Ambulatory Visit: Payer: Self-pay

## 2020-08-26 ENCOUNTER — Emergency Department (HOSPITAL_BASED_OUTPATIENT_CLINIC_OR_DEPARTMENT_OTHER): Payer: Medicare Other

## 2020-08-26 ENCOUNTER — Observation Stay (HOSPITAL_BASED_OUTPATIENT_CLINIC_OR_DEPARTMENT_OTHER)
Admission: EM | Admit: 2020-08-26 | Discharge: 2020-08-28 | Disposition: A | Discharge status: Home / Self Care | Payer: Medicare Other | Attending: Internal Medicine | Admitting: Internal Medicine

## 2020-08-26 DIAGNOSIS — I129 Hypertensive chronic kidney disease with stage 1 through stage 4 chronic kidney disease, or unspecified chronic kidney disease: Secondary | ICD-10-CM | POA: Diagnosis not present

## 2020-08-26 DIAGNOSIS — R079 Chest pain, unspecified: Secondary | ICD-10-CM | POA: Diagnosis not present

## 2020-08-26 DIAGNOSIS — Z7982 Long term (current) use of aspirin: Secondary | ICD-10-CM | POA: Insufficient documentation

## 2020-08-26 DIAGNOSIS — Z79899 Other long term (current) drug therapy: Secondary | ICD-10-CM | POA: Diagnosis not present

## 2020-08-26 DIAGNOSIS — N1831 Chronic kidney disease, stage 3a: Secondary | ICD-10-CM | POA: Insufficient documentation

## 2020-08-26 DIAGNOSIS — Z7984 Long term (current) use of oral hypoglycemic drugs: Secondary | ICD-10-CM | POA: Insufficient documentation

## 2020-08-26 DIAGNOSIS — I200 Unstable angina: Secondary | ICD-10-CM | POA: Diagnosis present

## 2020-08-26 DIAGNOSIS — J45909 Unspecified asthma, uncomplicated: Secondary | ICD-10-CM | POA: Diagnosis not present

## 2020-08-26 DIAGNOSIS — Z955 Presence of coronary angioplasty implant and graft: Secondary | ICD-10-CM | POA: Diagnosis not present

## 2020-08-26 DIAGNOSIS — I2510 Atherosclerotic heart disease of native coronary artery without angina pectoris: Secondary | ICD-10-CM | POA: Insufficient documentation

## 2020-08-26 DIAGNOSIS — E039 Hypothyroidism, unspecified: Secondary | ICD-10-CM | POA: Insufficient documentation

## 2020-08-26 DIAGNOSIS — E1122 Type 2 diabetes mellitus with diabetic chronic kidney disease: Secondary | ICD-10-CM | POA: Diagnosis not present

## 2020-08-26 DIAGNOSIS — Z20822 Contact with and (suspected) exposure to covid-19: Secondary | ICD-10-CM | POA: Insufficient documentation

## 2020-08-26 DIAGNOSIS — I10 Essential (primary) hypertension: Secondary | ICD-10-CM | POA: Diagnosis present

## 2020-08-26 DIAGNOSIS — E785 Hyperlipidemia, unspecified: Secondary | ICD-10-CM | POA: Diagnosis present

## 2020-08-26 DIAGNOSIS — I480 Paroxysmal atrial fibrillation: Secondary | ICD-10-CM

## 2020-08-26 DIAGNOSIS — R0789 Other chest pain: Secondary | ICD-10-CM | POA: Diagnosis present

## 2020-08-26 LAB — RESP PANEL BY RT-PCR (FLU A&B, COVID) ARPGX2
Influenza A by PCR: NEGATIVE
Influenza B by PCR: NEGATIVE
SARS Coronavirus 2 by RT PCR: NEGATIVE

## 2020-08-26 LAB — BASIC METABOLIC PANEL
Anion gap: 12 (ref 5–15)
BUN: 19 mg/dL (ref 8–23)
CO2: 23 mmol/L (ref 22–32)
Calcium: 9.4 mg/dL (ref 8.9–10.3)
Chloride: 102 mmol/L (ref 98–111)
Creatinine, Ser: 1.24 mg/dL — ABNORMAL HIGH (ref 0.44–1.00)
GFR, Estimated: 48 mL/min — ABNORMAL LOW (ref >60)
Glucose, Bld: 177 mg/dL — ABNORMAL HIGH (ref 70–99)
Potassium: 3.6 mmol/L (ref 3.5–5.1)
Sodium: 137 mmol/L (ref 135–145)

## 2020-08-26 LAB — CBC
HCT: 38.3 % (ref 36.0–46.0)
Hemoglobin: 12.9 g/dL (ref 12.0–15.0)
MCH: 29.3 pg (ref 26.0–34.0)
MCHC: 33.7 g/dL (ref 30.0–36.0)
MCV: 87 fL (ref 80.0–100.0)
Platelets: 284 10*3/uL (ref 150–400)
Platelets: 229 10*3/uL (ref 150–400)
RBC: 4.4 MIL/uL (ref 3.87–5.11)
RDW: 13.3 % (ref 11.5–15.5)
WBC: 7.1 10*3/uL (ref 4.0–10.5)
nRBC: 0 % (ref 0.0–0.2)
nRBC: 0 % (ref 0.0–0.2)

## 2020-08-26 LAB — TROPONIN I (HIGH SENSITIVITY)
Troponin I (High Sensitivity): <2 ng/L (ref <18)
Troponin I (High Sensitivity): <2 ng/L (ref <18)

## 2020-08-26 LAB — GLUCOSE, CAPILLARY: Glucose-Capillary: 192 mg/dL — ABNORMAL HIGH (ref 70–99)

## 2020-08-26 MED ORDER — PANTOPRAZOLE SODIUM 40 MG PO TBEC
40.0000 mg | DELAYED_RELEASE_TABLET | Freq: Two times a day (BID) | ORAL | Status: DC
  Administered 2020-08-26 – 2020-08-28 (×4): 40 mg via ORAL
  Filled 2020-08-26 (×4): qty 1

## 2020-08-26 MED ORDER — ACETAMINOPHEN 325 MG PO TABS: 650.0000 mg | ORAL_TABLET | Freq: Four times a day (QID) | ORAL | Status: DC | PRN

## 2020-08-26 MED ORDER — ASPIRIN 81 MG PO CHEW
81.0000 mg | CHEWABLE_TABLET | Freq: Every day | ORAL | Status: DC
  Administered 2020-08-27 – 2020-08-28 (×2): 81 mg via ORAL
  Filled 2020-08-26 (×2): qty 1

## 2020-08-26 MED ORDER — LEVOTHYROXINE SODIUM 88 MCG PO TABS
88.0000 ug | ORAL_TABLET | ORAL | Status: DC
  Administered 2020-08-28: 88 ug via ORAL
  Filled 2020-08-26: qty 1

## 2020-08-26 MED ORDER — FAMOTIDINE 20 MG PO TABS
20.0000 mg | ORAL_TABLET | Freq: Every day | ORAL | Status: DC
  Administered 2020-08-26 – 2020-08-27 (×2): 20 mg via ORAL
  Filled 2020-08-26 (×2): qty 1

## 2020-08-26 MED ORDER — DICYCLOMINE HCL 10 MG PO CAPS
10.0000 mg | ORAL_CAPSULE | Freq: Every day | ORAL | Status: DC
  Administered 2020-08-26 – 2020-08-27 (×2): 10 mg via ORAL
  Filled 2020-08-26 (×2): qty 1

## 2020-08-26 MED ORDER — ACETAMINOPHEN 650 MG RE SUPP: 650.0000 mg | Freq: Four times a day (QID) | RECTAL | Status: DC | PRN

## 2020-08-26 MED ORDER — DILTIAZEM HCL ER COATED BEADS 240 MG PO CP24
240.0000 mg | ORAL_CAPSULE | Freq: Every day | ORAL | Status: DC
  Administered 2020-08-27: 240 mg via ORAL
  Filled 2020-08-26: qty 1

## 2020-08-26 MED ORDER — FENOFIBRATE 160 MG PO TABS
160.0000 mg | ORAL_TABLET | Freq: Every day | ORAL | Status: DC
  Administered 2020-08-27 – 2020-08-28 (×2): 160 mg via ORAL
  Filled 2020-08-26 (×2): qty 1

## 2020-08-26 MED ORDER — ENOXAPARIN SODIUM 40 MG/0.4ML ~~LOC~~ SOLN
40.0000 mg | SUBCUTANEOUS | Status: DC
  Administered 2020-08-26: 40 mg via SUBCUTANEOUS
  Filled 2020-08-26: qty 0.4

## 2020-08-26 MED ORDER — NITROGLYCERIN 0.4 MG SL SUBL
0.4000 mg | SUBLINGUAL_TABLET | SUBLINGUAL | Status: DC | PRN
  Administered 2020-08-26 – 2020-08-27 (×2): 0.4 mg via SUBLINGUAL
  Filled 2020-08-26 (×2): qty 1

## 2020-08-26 MED ORDER — LEVOTHYROXINE SODIUM 112 MCG PO TABS
166.0000 ug | ORAL_TABLET | ORAL | Status: DC
  Administered 2020-08-27: 168 ug via ORAL
  Filled 2020-08-26: qty 2

## 2020-08-26 MED ORDER — METOPROLOL TARTRATE 25 MG PO TABS
25.0000 mg | ORAL_TABLET | Freq: Two times a day (BID) | ORAL | Status: DC
  Administered 2020-08-26 – 2020-08-28 (×4): 25 mg via ORAL
  Filled 2020-08-26 (×4): qty 1

## 2020-08-26 MED ORDER — INSULIN ASPART 100 UNIT/ML ~~LOC~~ SOLN
0.0000 [IU] | Freq: Three times a day (TID) | SUBCUTANEOUS | Status: DC
  Administered 2020-08-27 (×2): 2 [IU] via SUBCUTANEOUS

## 2020-08-26 MED ORDER — COLESEVELAM HCL 625 MG PO TABS
1250.0000 mg | ORAL_TABLET | Freq: Every day | ORAL | Status: DC
  Administered 2020-08-27 – 2020-08-28 (×2): 1250 mg via ORAL
  Filled 2020-08-26 (×2): qty 2

## 2020-08-26 NOTE — ED Triage Notes (Signed)
Pt reports chest pain onset around 1315. She took NTG with some relief, but pain is now worse. Hx of cardiac stents several years ago

## 2020-08-26 NOTE — H&P (Signed)
History and Physical    [name] [name] RXV:[id number] DOB: 1955-09-07 DOA: 08/26/2020  PCP: Ma Hillock, DO  Patient coming from: Home.  Chief Complaint: Chest pain.  HPI: [name] [name] is a 65 y.o. female with history of CAD status post stenting, hypertension hyperlipidemia hypothyroidism and diabetes mellitus was brought to the ER at [facility] with complaint of chest pain.  Patient chest pain happened around 1:15 PM today while working in the yard.  Patient was picking up broken limbs of the tree when patient started having substernal chest pressure nonradiating squeezing in type which became more intense and started moving across the chest.  Patient took some sublingual nitroglycerin with no much relief.  At this point patient decided come to the ER.  Did not have any productive cough fever chills or shortness of breath.  ED Course: In the ER patient had chest pain and was given another dose of sublingual nitroglycerin which did not immediately relieve pain but subsequently patient became chest pain-free.  EKG shows normal sinus rhythm low voltage chest x-ray unremarkable Covid test negative.  High sensitive troponins were negative.  Rest of the labs are largely unremarkable.  Patient admitted for further work-up of chest pain concerning for angina.  Review of Systems: As per HPI, rest all negative.   Past Medical History:  Diagnosis Date  . Accelerating angina ([place]) 11/04/2017  . Asthma   . Chronic pain disorder    neck and shoulder  . CTS (carpal tunnel syndrome) 01/27/2013   right  . Diabetes mellitus type II   . Diabetic gastroparesis ([place])   . Esophageal reflux 08/25/2013   [place], Dr Barth Kirks  . FATTY LIVER DISEASE 04/27/2007   Qualifier: Diagnosis of  By: Danelle Earthly CMA, Darlene    . Focal muscle atrophy 09/26/2015  . Fundic gland polyps of stomach, benign   . HTN (hypertension)   . Hyperlipidemia, mixed 05/04/2007   Qualifier: Diagnosis of  By: Wynona Luna crestor caused myalgias even at low dose Livalo caused myalgias Lipitor  Mother with severe reaction myalgias Simvastatin, Welchol   . Hypertension 08/24/2010  . Hypokalemia 05/25/2013   Improved stopping HCTZ. Was noted to have an elevated glucose when K was low. Recheck renal next week after starting Maxzide  . Hypomagnesemia 08/10/2014  . Hypothyroidism   . Iron deficiency anemia 08/07/2018  . Iron malabsorption 08/07/2018  . Laryngitis 08/25/2017  . Leg cramps 04/15/2016  . Nausea without vomiting 08/08/2014  . NONSPEC ELEVATION OF LEVELS OF TRANSAMINASE/LDH 05/01/2007   Qualifier: Diagnosis of  By: Garner Gavel    . Osteoarthritis    chronic, right knee (11/04/2017)  . OVARIAN CYST 07/11/2009   Qualifier: Diagnosis of  By: Wynona Luna   . Ovarian cyst   . Plantar fasciitis   . Rosacea 10/22/2016  . Urine incontinence     Past Surgical History:  Procedure Laterality Date  . AUGMENTATION MAMMAPLASTY Bilateral 2002  . BLADDER SUSPENSION  1981  . COLONOSCOPY    . CORONARY STENT INTERVENTION N/A 11/04/2017   Procedure: CORONARY STENT INTERVENTION;  Surgeon: Nelva Bush, MD;  Location: [place] [facility];  Service: Cardiovascular;  Laterality: N/A;  . ENDOVENOUS ABLATION SAPHENOUS VEIN W/ LASER Left 12/29/2019   endovenous laser ablation left greater saphenous vein by Ruta Hinds MD   . ESOPHAGOGASTRODUODENOSCOPY    . INTRAVASCULAR PRESSURE WIRE/FFR STUDY N/A 11/04/2017   Procedure: INTRAVASCULAR PRESSURE WIRE/FFR STUDY;  Surgeon:  End, Harrell Gave, MD;  Location: [place] [facility];  Service: Cardiovascular;  Laterality: N/A;  . INTRAVASCULAR ULTRASOUND/IVUS N/A 11/04/2017   Procedure: Intravascular Ultrasound/IVUS;  Surgeon: Nelva Bush, MD;  Location: [facility];  Service: Cardiovascular;  Laterality: N/A;  . KNEE ARTHROSCOPY  1998, [id number]  . LAPAROSCOPIC CHOLECYSTECTOMY  2007  . LEFT HEART CATH AND CORONARY ANGIOGRAPHY N/A 11/04/2017   Procedure: LEFT  HEART CATH AND CORONARY ANGIOGRAPHY;  Surgeon: Nelva Bush, MD;  Location: [place] [facility];  Service: Cardiovascular;  Laterality: N/A;  . LEFT HEART CATHETERIZATION WITH CORONARY ANGIOGRAM N/A 11/21/2011   Procedure: LEFT HEART CATHETERIZATION WITH CORONARY ANGIOGRAM;  Surgeon: Josue Hector, MD;  Location: [facility];  Service: Cardiovascular;  Laterality: N/A;  . TONSILLECTOMY  1975  . VAGINAL HYSTERECTOMY  1983   "still have my ovaries"     reports that she has never smoked. She has never used smokeless tobacco. She reports previous alcohol use. She reports that she does not use drugs.  Allergies  Allergen Reactions  . Epinephrine Other (See Comments)    Pass out   . Statins Other (See Comments)    Myalgias: Simvastatin also caused back ache  . Welchol [Colesevelam Hcl]     myalgia  . Amoxicillin Itching and Rash    Has patient had a PCN reaction causing immediate rash, facial/tongue/throat swelling, SOB or lightheadedness with hypotension: No Has patient had a PCN reaction causing severe rash involving mucus membranes or skin necrosis: No Has patient had a PCN reaction that required hospitalization: No Has patient had a PCN reaction occurring within the last 10 years: Yes If all of the above answers are "NO", then may proceed with Cephalosporin use.     Family History  Problem Relation Age of Onset  . Alzheimer's disease Mother   . Diabetes type II Mother   . Hiatal hernia Mother   . Diabetes Mother   . Emphysema Father   . COPD Father   . Depression Sister        suicide  . Diabetes Sister   . Diabetes Daughter   . Colon cancer Neg Hx   . Stomach cancer Neg Hx     Prior to Admission medications   Medication Sig Start Date End Date Taking? Authorizing Provider  acarbose (PRECOSE) 25 MG tablet Take 1 tablet (25 mg total) by mouth 3 (three) times daily with meals. 05/02/20   Renato Shin, MD  Accu-Chek Softclix Lancets lancets Use 4 times daily as needed or  directed 03/09/20   Renato Shin, MD  aspirin 81 MG tablet Take 81 mg by mouth daily.     [provider]  Calcium Carbonate (CALTRATE 600 PO) Take 600 mg by mouth 2 (two) times daily.     [provider]  colesevelam (WELCHOL) 625 MG tablet Take 2 tablets (1,250 mg total) by mouth daily. 03/12/20   Renato Shin, MD  dicyclomine (BENTYL) 10 MG capsule 1 tab before breakfast and 1 tab prior to bed. 02/25/20   Kuneff, [name], DO  diltiazem (CARDIZEM CD) 240 MG 24 hr capsule TAKE 1 CAPSULE BY MOUTH EVERY DAY 06/06/20   Minus Breeding, MD  Evolocumab (REPATHA SURECLICK) 643 MG/ML SOAJ Inject 1 Dose into the skin every 14 (fourteen) days. 02/07/20   Hilty, Nadean Corwin, MD  famotidine (PEPCID) 20 MG tablet Take 1 tablet (20 mg total) by mouth at bedtime. 05/31/20   Gatha Mayer, MD  fenofibrate 160 MG tablet  Take 1 tablet (160 mg total) by mouth daily. 06/01/20   Kuneff, [name], DO  Glucosamine-Chondroitin (OSTEO BI-FLEX REGULAR STRENGTH) 250-200 MG TABS Take 1 tablet by mouth 2 (two) times daily.     [provider]  glucose blood (ACCU-CHEK GUIDE) test strip Use 4 times daily as needed or directed. 03/09/20   Renato Shin, MD  Krill Oil 350 MG CAPS Take 350 mg by mouth daily.     [provider]  levothyroxine (SYNTHROID) 88 MCG tablet Take one tablet daily on an empty stomach, 2 tabs on Saturday. 06/01/20   Kuneff, [name], DO  Magnesium 250 MG TABS Take 1 tablet by mouth daily.    [provider]  metFORMIN (GLUCOPHAGE) 500 MG tablet Take 500 mg by mouth daily.    [provider]  metoprolol tartrate (LOPRESSOR) 25 MG tablet Take 1 tablet (25 mg total) by mouth 2 (two) times daily. 06/06/20   Minus Breeding, MD  nitroGLYCERIN (NITROSTAT) 0.4 MG SL tablet Place 1 tablet (0.4 mg total) under the tongue every 5 (five) minutes as needed for chest pain. 08/25/17   Mosie Lukes, MD  pantoprazole (PROTONIX) 40 MG tablet Take 1 tablet (40 mg total) by mouth  2 (two) times daily. 05/31/20   Gatha Mayer, MD  pioglitazone (ACTOS) 30 MG tablet Take 1 tablet (30 mg total) by mouth daily. 06/28/20   Renato Shin, MD  POTASSIUM CITRATE PO Take 99 mg by mouth daily.     [provider]  Probiotic Product (PROBIOTIC DAILY PO) Take 99 mg by mouth daily.     [provider]  repaglinide (PRANDIN) 2 MG tablet Take 2 mg by mouth in the morning and at bedtime.    [provider]    Physical Exam: Constitutional: Moderately built and nourished. Vitals:   08/26/20 1730 08/26/20 1800 08/26/20 1900 08/26/20 2034  BP: 126/62 (!) 133/57 (!) 133/55 137/68  Pulse: 69 70 67 84  Resp: 15 18 16 18   Temp:    98.1 F (36.7 C)  TempSrc:    Oral  SpO2: 97% 98% 97% 100%  Weight:    82.5 kg  Height:    5\' 5"  (1.651 m)   Eyes: Anicteric no pallor. ENMT: No discharge from the ears eyes nose or mouth. Neck: No mass felt.  No neck rigidity. Respiratory: No rhonchi or crepitations. Cardiovascular: S1-S2 heard. Abdomen: Soft nontender bowel sounds present. Musculoskeletal: No edema. Skin: No rash. Neurologic: Alert awake oriented to time place and person.  Moves all extremities. Psychiatric: Appears normal.  Normal affect.   Labs on Admission: I have personally reviewed following labs and imaging studies  CBC: Recent Labs  Lab 08/26/20 1412  WBC 7.1  HGB 12.9  HCT 38.3  MCV 87.0  PLT 259   Basic Metabolic Panel: Recent Labs  Lab 08/26/20 1412  NA 137  K 3.6  CL 102  CO2 23  GLUCOSE 177*  BUN 19  CREATININE 1.24*  CALCIUM 9.4   GFR: Estimated Creatinine Clearance: 48 mL/min (A) (by C-G formula based on SCr of 1.24 mg/dL (H)). Liver Function Tests: No results for input(s): AST, ALT, ALKPHOS, BILITOT, PROT, ALBUMIN in the last 168 hours. No results for input(s): LIPASE, AMYLASE in the last 168 hours. No results for input(s): AMMONIA in the last 168 hours. Coagulation Profile: No results for input(s): INR, PROTIME in  the last 168 hours. Cardiac Enzymes: No results for input(s): CKTOTAL, CKMB, CKMBINDEX, TROPONINI in  the last 168 hours. BNP (last 3 results) No results for input(s): PROBNP in the last 8760 hours. HbA1C: No results for input(s): HGBA1C in the last 72 hours. CBG: Recent Labs  Lab 08/26/20 2115  GLUCAP 192*   Lipid Profile: No results for input(s): CHOL, HDL, LDLCALC, TRIG, CHOLHDL, LDLDIRECT in the last 72 hours. Thyroid Function Tests: No results for input(s): TSH, T4TOTAL, FREET4, T3FREE, THYROIDAB in the last 72 hours. Anemia Panel: No results for input(s): VITAMINB12, FOLATE, FERRITIN, TIBC, IRON, RETICCTPCT in the last 72 hours. Urine analysis:    Component Value Date/Time   COLORURINE YELLOW 02/25/2020 1154   APPEARANCEUR CLEAR 02/25/2020 1154   LABSPEC 1.022 02/25/2020 1154   PHURINE 5.5 02/25/2020 1154   GLUCOSEU 2+ (A) 02/25/2020 1154   GLUCOSEU NEGATIVE 12/29/2013 0734   HGBUR NEGATIVE 02/25/2020 1154   HGBUR negative 08/08/2009 1437   BILIRUBINUR NEGATIVE 11/28/2014 1125   KETONESUR NEGATIVE 02/25/2020 1154   PROTEINUR NEGATIVE 02/25/2020 1154   UROBILINOGEN 0.2 11/28/2014 1125   NITRITE NEGATIVE 11/28/2014 1125   LEUKOCYTESUR NEGATIVE 11/28/2014 1125   Sepsis Labs: @LABRCNTIP (procalcitonin:4,lacticidven:4) ) Recent Results (from the past 240 hour(s))  Resp Panel by RT-PCR (Flu A&B, Covid) Nasopharyngeal Swab     Status: None   Collection Time: 08/26/20  6:20 PM   Specimen: Nasopharyngeal Swab; Nasopharyngeal(NP) swabs in vial transport medium  Result Value Ref Range Status   SARS Coronavirus 2 by RT PCR NEGATIVE NEGATIVE Final    Comment: (NOTE) SARS-CoV-2 target nucleic acids are NOT DETECTED.  The SARS-CoV-2 RNA is generally detectable in upper respiratory specimens during the acute phase of infection. The lowest concentration of SARS-CoV-2 viral copies this assay can detect is 138 copies/mL. A negative result does not preclude SARS-Cov-2 infection  and should not be used as the sole basis for treatment or other patient management decisions. A negative result may occur with  improper specimen collection/handling, submission of specimen other than nasopharyngeal swab, presence of viral mutation(s) within the areas targeted by this assay, and inadequate number of viral copies(<138 copies/mL). A negative result must be combined with clinical observations, patient history, and epidemiological information. The expected result is Negative.  Fact Sheet for Patients:  EntrepreneurPulse.com.au  Fact Sheet for Healthcare Providers:  IncredibleEmployment.be  This test is no t yet approved or cleared by the [place] FDA and  has been authorized for detection and/or diagnosis of SARS-CoV-2 by FDA under an Emergency Use Authorization (EUA). This EUA will remain  in effect (meaning this test can be used) for the duration of the COVID-19 declaration under Section 564(b)(1) of the Act, 21 U.S.C.section 360bbb-3(b)(1), unless the authorization is terminated  or revoked sooner.       Influenza A by PCR NEGATIVE NEGATIVE Final   Influenza B by PCR NEGATIVE NEGATIVE Final    Comment: (NOTE) The Xpert Xpress SARS-CoV-2/FLU/RSV plus assay is intended as an aid in the diagnosis of influenza from Nasopharyngeal swab specimens and should not be used as a sole basis for treatment. Nasal washings and aspirates are unacceptable for Xpert Xpress SARS-CoV-2/FLU/RSV testing.  Fact Sheet for Patients: EntrepreneurPulse.com.au  Fact Sheet for Healthcare Providers: IncredibleEmployment.be  This test is not yet approved or cleared by the [place] FDA and has been authorized for detection and/or diagnosis of SARS-CoV-2 by FDA under an Emergency Use Authorization (EUA). This EUA will remain in effect (meaning this test can be used) for the duration of the COVID-19 declaration  under Section 564(b)(1) of the Act, 21  U.S.C. section 360bbb-3(b)(1), unless the authorization is terminated or revoked.  Performed at [facility], [address] [place], [place] [address]      Radiological Exams on Admission: DG Chest 2 View  Result Date: 08/26/2020 CLINICAL DATA:  Chest pain. EXAM: CHEST - 2 VIEW COMPARISON:  03/18/2018. FINDINGS: The heart size and mediastinal contours are within normal limits and similar to prior. Both lungs are clear. No visible pleural effusions or pneumothorax. No acute osseous abnormality. Similar appearance of the chest in comparison to prior. IMPRESSION: No active cardiopulmonary disease. Electronically Signed   By: Margaretha Sheffield MD   On: 08/26/2020 14:38    EKG: Independently reviewed.  Normal sinus rhythm low voltage.  Assessment/Plan Principal Problem:   Chest pain Active Problems:   Hypothyroidism   Hyperlipidemia LDL goal <70   Hypertension   Unstable angina ([place])    1. Chest pain with history of CAD status post stenting concerning for angina.  Presently chest pain-free will continue with patient's beta-blockers antiplatelet agents and Welchol fenofibrate.  Will consult cardiology. 2. Diabetes mellitus type 2 we will keep patient on sliding scale coverage. 3. Hypothyroidism on Synthroid check TSH. 4. Hyperlipidemia presently patient is on a study drug.  Also takes WelChol and fenofibrate. 5. Hypertension on Cardizem and beta-blockers.   DVT prophylaxis: Lovenox. Code Status: Full code. Family Communication: Discussed with patient. Disposition Plan: Home. Consults called: Cardiology. Admission status: Observation.   Rise Patience MD [facility] Pager [phone].  If 7PM-7AM, please contact night-coverage [email or web address] Password [facility]  08/26/2020, 10:06 PM

## 2020-08-26 NOTE — ED Provider Notes (Signed)
[facility]ERG[facility] DEPARTMENT Provider Note   CSN: [id number] Arrival date & time: 08/26/20  1359     History Chief Complaint  Patient presents with  . Chest Pain    [name] [name] is a 65 y.o. female who presents with concern for central to right-sided chest pain that started while she was working in her yard this afternoon.  She states that she was doing yard work with her husband when at 1:15 she was struck with severe right-sided chest pain that radiated into her central and left chest and into her right arm with associated right arm numbness.  She states that she sat down and took 1 tablet of nitroglycerin at home with some relief.  She did that the pain lessened for about an hour when she was able to eat lunch, however he returned with intermittent sharp shooting pains in her right chest.  There is a constant central to right-sided chest pressure with intermittent stabbing right chest pain that radiates into her right arm.  She denies any shortness of breath or palpitations.  Denies any nausea, vomiting, fevers or chills at home.  Patient with history of coronary artery disease having had a stent placed in 2019.  At that time she presented with concern for shortness of breath on exertion.  I have personally reviewed this patient's medical records. She has hx of CAD, hyper lipidemia, hypothyroidism, hepatic steatosis, hypertension, diabetes.  HPI  HPI: A 65 year old patient with a history of treated diabetes, hypertension and hypercholesterolemia presents for evaluation of chest pain. Initial onset of pain was approximately 1-3 hours ago. The patient's chest pain is described as heaviness/pressure/tightness, is sharp, is not worse with exertion and is relieved by nitroglycerin. The patient's chest pain is middle- or left-sided, is not well-localized and does radiate to the arms/jaw/neck. The patient does not complain of nausea and denies diaphoresis. The patient has a family  history of coronary artery disease in a first-degree relative with onset less than age 72. The patient has no history of stroke, has no history of peripheral artery disease, has not smoked in the past 90 days and does not have an elevated BMI (>=30).   Past Medical History:  Diagnosis Date  . Accelerating angina ([place]) 11/04/2017  . Asthma   . Chronic pain disorder    neck and shoulder  . CTS (carpal tunnel syndrome) 01/27/2013   right  . Diabetes mellitus type II   . Diabetic gastroparesis ([place])   . Esophageal reflux 08/25/2013   [facility] [place], Dr Barth Kirks  . FATTY LIVER DISEASE 04/27/2007   Qualifier: Diagnosis of  By: Danelle Earthly CMA, Darlene    . Focal muscle atrophy 09/26/2015  . Fundic gland polyps of stomach, benign   . HTN (hypertension)   . Hyperlipidemia, mixed 05/04/2007   Qualifier: Diagnosis of  By: Wynona Luna crestor caused myalgias even at low dose Livalo caused myalgias Lipitor  Mother with severe reaction myalgias Simvastatin, Welchol   . Hypertension 08/24/2010  . Hypokalemia 05/25/2013   Improved stopping HCTZ. Was noted to have an elevated glucose when K was low. Recheck renal next week after starting Maxzide  . Hypomagnesemia 08/10/2014  . Hypothyroidism   . Iron deficiency anemia 08/07/2018  . Iron malabsorption 08/07/2018  . Laryngitis 08/25/2017  . Leg cramps 04/15/2016  . Nausea without vomiting 08/08/2014  . NONSPEC ELEVATION OF LEVELS OF TRANSAMINASE/LDH 05/01/2007   Qualifier: Diagnosis of  By: Garner Gavel    . Osteoarthritis  chronic, right knee (11/04/2017)  . OVARIAN CYST 07/11/2009   Qualifier: Diagnosis of  By: Wynona Luna   . Ovarian cyst   . Plantar fasciitis   . Rosacea 10/22/2016  . Urine incontinence     Patient Active Problem List   Diagnosis Date Noted  . Unstable angina ([place]) 08/26/2020  . Chest pain 08/26/2020  . Educated about COVID-19 virus infection 11/01/2019  . Varicose veins of both lower extremities with pain 06/15/2019  .  Iron deficiency anemia 08/07/2018  . Iron malabsorption 08/07/2018  . Globus sensation 04/13/2018  . CAD S/P percutaneous coronary angioplasty 11/12/2017  . Chronic pain of right upper extremity 11/10/2017  . Abnormal cardiac CT angiography 11/04/2017  . Rosacea 10/22/2016  . Non-insulin dependent type 2 diabetes mellitus ([place]) 07/26/2015  . Hypomagnesemia 08/10/2014  . Diabetic gastroparesis ([place]) 07/19/2014  . Esophageal reflux 08/25/2013  . Hypokalemia 05/25/2013  . CTS (carpal tunnel syndrome) 01/27/2013  . Hypertension 08/24/2010  . Hyperlipidemia LDL goal <70 05/04/2007  . Hypothyroidism 04/27/2007  . Hepatic steatosis 04/27/2007    Past Surgical History:  Procedure Laterality Date  . AUGMENTATION MAMMAPLASTY Bilateral 2002  . BLADDER SUSPENSION  1981  . COLONOSCOPY    . CORONARY STENT INTERVENTION N/A 11/04/2017   Procedure: CORONARY STENT INTERVENTION;  Surgeon: Nelva Bush, MD;  Location: [facility];  Service: Cardiovascular;  Laterality: N/A;  . ENDOVENOUS ABLATION SAPHENOUS VEIN W/ LASER Left 12/29/2019   endovenous laser ablation left greater saphenous vein by Ruta Hinds MD   . ESOPHAGOGASTRODUODENOSCOPY    . INTRAVASCULAR PRESSURE WIRE/FFR STUDY N/A 11/04/2017   Procedure: INTRAVASCULAR PRESSURE WIRE/FFR STUDY;  Surgeon: Nelva Bush, MD;  Location: [facility];  Service: Cardiovascular;  Laterality: N/A;  . INTRAVASCULAR ULTRASOUND/IVUS N/A 11/04/2017   Procedure: Intravascular Ultrasound/IVUS;  Surgeon: Nelva Bush, MD;  Location: [facility];  Service: Cardiovascular;  Laterality: N/A;  . KNEE ARTHROSCOPY  1998, [id number]  . LAPAROSCOPIC CHOLECYSTECTOMY  2007  . LEFT HEART CATH AND CORONARY ANGIOGRAPHY N/A 11/04/2017   Procedure: LEFT HEART CATH AND CORONARY ANGIOGRAPHY;  Surgeon: Nelva Bush, MD;  Location: [facility];  Service: Cardiovascular;  Laterality: N/A;  . LEFT HEART CATHETERIZATION WITH CORONARY ANGIOGRAM  N/A 11/21/2011   Procedure: LEFT HEART CATHETERIZATION WITH CORONARY ANGIOGRAM;  Surgeon: Josue Hector, MD;  Location: [facility];  Service: Cardiovascular;  Laterality: N/A;  . TONSILLECTOMY  1975  . VAGINAL HYSTERECTOMY  1983   "still have my ovaries"     OB History    Gravida  1   Para  1   Term      Preterm      AB      Living  1     SAB      IAB      Ectopic      Multiple      Live Births              Family History  Problem Relation Age of Onset  . Alzheimer's disease Mother   . Diabetes type II Mother   . Hiatal hernia Mother   . Diabetes Mother   . Emphysema Father   . COPD Father   . Depression Sister        suicide  . Diabetes Sister   . Diabetes Daughter   . Colon cancer Neg Hx   . Stomach cancer Neg Hx     Social History  Tobacco Use  . Smoking status: Never Smoker  . Smokeless tobacco: Never Used  Vaping Use  . Vaping Use: Never used  Substance Use Topics  . Alcohol use: Not Currently  . Drug use: Never    Home Medications Prior to Admission medications   Medication Sig Start Date End Date Taking? Authorizing Provider  acarbose (PRECOSE) 25 MG tablet Take 1 tablet (25 mg total) by mouth 3 (three) times daily with meals. 05/02/20   Renato Shin, MD  Accu-Chek Softclix Lancets lancets Use 4 times daily as needed or directed 03/09/20   Renato Shin, MD  aspirin 81 MG tablet Take 81 mg by mouth daily.     [provider]  Calcium Carbonate (CALTRATE 600 PO) Take 600 mg by mouth 2 (two) times daily.     [provider]  colesevelam (WELCHOL) 625 MG tablet Take 2 tablets (1,250 mg total) by mouth daily. 03/12/20   Renato Shin, MD  dicyclomine (BENTYL) 10 MG capsule 1 tab before breakfast and 1 tab prior to bed. 02/25/20   Kuneff, [name], DO  diltiazem (CARDIZEM CD) 240 MG 24 hr capsule TAKE 1 CAPSULE BY MOUTH EVERY DAY 06/06/20   Minus Breeding, MD  Evolocumab (REPATHA SURECLICK) 539 MG/ML SOAJ Inject 1 Dose into  the skin every 14 (fourteen) days. 02/07/20   Hilty, Nadean Corwin, MD  famotidine (PEPCID) 20 MG tablet Take 1 tablet (20 mg total) by mouth at bedtime. 05/31/20   Gatha Mayer, MD  fenofibrate 160 MG tablet Take 1 tablet (160 mg total) by mouth daily. 06/01/20   Kuneff, [name], DO  Glucosamine-Chondroitin (OSTEO BI-FLEX REGULAR STRENGTH) 250-200 MG TABS Take 1 tablet by mouth 2 (two) times daily.     [provider]  glucose blood (ACCU-CHEK GUIDE) test strip Use 4 times daily as needed or directed. 03/09/20   Renato Shin, MD  Krill Oil 350 MG CAPS Take 350 mg by mouth daily.     [provider]  levothyroxine (SYNTHROID) 88 MCG tablet Take one tablet daily on an empty stomach, 2 tabs on Saturday. 06/01/20   Kuneff, [name], DO  Magnesium 250 MG TABS Take 1 tablet by mouth daily.    [provider]  metFORMIN (GLUCOPHAGE) 500 MG tablet Take 500 mg by mouth daily.    [provider]  metoprolol tartrate (LOPRESSOR) 25 MG tablet Take 1 tablet (25 mg total) by mouth 2 (two) times daily. 06/06/20   Minus Breeding, MD  nitroGLYCERIN (NITROSTAT) 0.4 MG SL tablet Place 1 tablet (0.4 mg total) under the tongue every 5 (five) minutes as needed for chest pain. 08/25/17   Mosie Lukes, MD  pantoprazole (PROTONIX) 40 MG tablet Take 1 tablet (40 mg total) by mouth 2 (two) times daily. 05/31/20   Gatha Mayer, MD  pioglitazone (ACTOS) 30 MG tablet Take 1 tablet (30 mg total) by mouth daily. 06/28/20   Renato Shin, MD  POTASSIUM CITRATE PO Take 99 mg by mouth daily.     [provider]  Probiotic Product (PROBIOTIC DAILY PO) Take 99 mg by mouth daily.     [provider]  repaglinide (PRANDIN) 2 MG tablet Take 2 mg by mouth in the morning and at bedtime.    [provider]    Allergies    Epinephrine, Statins, Welchol [colesevelam hcl], and Amoxicillin  Review of Systems   Review of Systems  Constitutional: Negative.   HENT: Negative.    Respiratory: Negative.  Cardiovascular: Positive for chest pain. Negative for palpitations and leg swelling.  Gastrointestinal: Negative.   Genitourinary: Negative.   Musculoskeletal: Negative.   Skin: Negative.   Neurological: Positive for light-headedness. Negative for dizziness, tremors, syncope, facial asymmetry, weakness and headaches.    Physical Exam Updated Vital Signs BP 137/68 (BP Location: Left Arm)   Pulse 84   Temp 98.1 F (36.7 C) (Oral)   Resp 18   Ht 5\' 5"  (1.651 m)   Wt 82.5 kg   SpO2 100%   BMI 30.25 kg/m   Physical Exam Vitals and nursing note reviewed.  Constitutional:      Appearance: She is not ill-appearing.  HENT:     Head: Normocephalic and atraumatic.     Nose: Nose normal.     Mouth/Throat:     Mouth: Mucous membranes are moist.     Pharynx: Oropharynx is clear. Uvula midline. No oropharyngeal exudate or posterior oropharyngeal erythema.  Eyes:     General: Lids are normal. Vision grossly intact.        Right eye: No discharge.        Left eye: No discharge.     Extraocular Movements: Extraocular movements intact.     Conjunctiva/sclera: Conjunctivae normal.     Pupils: Pupils are equal, round, and reactive to light.  Neck:     Trachea: Trachea and phonation normal.  Cardiovascular:     Rate and Rhythm: Normal rate and regular rhythm.     Pulses: Normal pulses.          Posterior tibial pulses are 2+ on the right side and 2+ on the left side.     Heart sounds: Normal heart sounds.   No systolic murmur is present.   Pulmonary:     Effort: Pulmonary effort is normal. No tachypnea, bradypnea, accessory muscle usage, prolonged expiration or respiratory distress.     Breath sounds: Normal breath sounds. No wheezing or rales.  Chest:     Chest wall: Tenderness present. No mass, deformity, crepitus or edema. There is no dullness to percussion.  Abdominal:     General: Bowel sounds are normal. There is no distension.     Palpations:  Abdomen is soft.     Tenderness: There is no abdominal tenderness. There is no right CVA tenderness, left CVA tenderness, guarding or rebound.  Musculoskeletal:        General: No deformity.     Cervical back: Normal range of motion and neck supple. No rigidity or crepitus. No pain with movement, spinous process tenderness or muscular tenderness.     Right lower leg: No tenderness. No edema.     Left lower leg: No tenderness. No edema.  Lymphadenopathy:     Cervical: No cervical adenopathy.  Skin:    General: Skin is warm and dry.     Capillary Refill: Capillary refill takes less than 2 seconds.  Neurological:     General: No focal deficit present.     Mental Status: She is alert and oriented to person, place, and time. Mental status is at baseline.     Cranial Nerves: Cranial nerves are intact.     Sensory: Sensation is intact.     Motor: Motor function is intact.     Gait: Gait is intact.  Psychiatric:        Mood and Affect: Mood normal.     ED Results / Procedures / Treatments   Labs (all labs ordered are listed, but only abnormal results  are displayed) Labs Reviewed  BASIC METABOLIC PANEL - Abnormal; Notable for the following components:      Result Value   Glucose, Bld 177 (*)    Creatinine, Ser 1.24 (*)    GFR, Estimated 48 (*)    All other components within normal limits  GLUCOSE, CAPILLARY - Abnormal; Notable for the following components:   Glucose-Capillary 192 (*)    All other components within normal limits  RESP PANEL BY RT-PCR (FLU A&B, COVID) ARPGX2  CBC  HIV ANTIBODY (ROUTINE TESTING W REFLEX)  CBC  CREATININE, SERUM  TSH  COMPREHENSIVE METABOLIC PANEL  CBC  TROPONIN I (HIGH SENSITIVITY)  TROPONIN I (HIGH SENSITIVITY)  TROPONIN I (HIGH SENSITIVITY)    EKG EKG Interpretation  Date/Time:  Saturday August 26 2020 14:08:34 EDT Ventricular Rate:  74 PR Interval:  203 QRS Duration: 89 QT Interval:  401 QTC Calculation: 445 R Axis:   57 Text  Interpretation: Sinus rhythm Low voltage, precordial leads Borderline T abnormalities, anterior leads Baseline wander in lead(s) I III aVL NSR, no change from previous Confirmed by Lavenia Atlas [phone]) on 08/26/2020 2:14:30 PM   Radiology DG Chest 2 View  Result Date: 08/26/2020 CLINICAL DATA:  Chest pain. EXAM: CHEST - 2 VIEW COMPARISON:  03/18/2018. FINDINGS: The heart size and mediastinal contours are within normal limits and similar to prior. Both lungs are clear. No visible pleural effusions or pneumothorax. No acute osseous abnormality. Similar appearance of the chest in comparison to prior. IMPRESSION: No active cardiopulmonary disease. Electronically Signed   By: Margaretha Sheffield MD   On: 08/26/2020 14:38    Procedures Procedures   Medications Ordered in ED Medications  nitroGLYCERIN (NITROSTAT) SL tablet 0.4 mg (0.4 mg Sublingual Given 08/26/20 1501)  aspirin chewable tablet 81 mg (has no administration in time range)  colesevelam [facility]) tablet 1,250 mg (has no administration in time range)  diltiazem (CARDIZEM CD) 24 hr capsule 240 mg (has no administration in time range)  fenofibrate tablet 160 mg (has no administration in time range)  metoprolol tartrate (LOPRESSOR) tablet 25 mg (has no administration in time range)  levothyroxine (SYNTHROID) tablet 88 mcg (has no administration in time range)  dicyclomine (BENTYL) capsule 10 mg (has no administration in time range)  famotidine (PEPCID) tablet 20 mg (has no administration in time range)  pantoprazole (PROTONIX) EC tablet 40 mg (has no administration in time range)  insulin aspart (novoLOG) injection 0-9 Units (has no administration in time range)  enoxaparin (LOVENOX) injection 40 mg (has no administration in time range)  acetaminophen (TYLENOL) tablet 650 mg (has no administration in time range)    Or  acetaminophen (TYLENOL) suppository 650 mg (has no administration in time range)  levothyroxine (SYNTHROID) tablet 168 mcg  (has no administration in time range)    ED Course  I have reviewed the triage vital signs and the nursing notes.  Pertinent labs & imaging results that were available during my care of the patient were reviewed by me and considered in my medical decision making (see chart for details).  Clinical Course as of 08/26/20 2236  Sat Aug 26, 2020  1727 Consult to cardiologist, Dr. Caryl Comes, who agrees with plan for admission to [facility] for further evaluation and work-up.  Cardiology will be available to consult once she is at their state. I appreciate his collaboration in the care of this patient.  [RS]  2229 Consult to the hospitalist, Dr. Charlann Lange, and [facility] who is agreeable to  seeing this patient admitting her to her service.  Appreciate your collaboration care of this patient.  COVID-19 test and temper admission orders have been placed. [RS]    Clinical Course User Index [RS] Renise Gillies, Gypsy Balsam, PA-C   MDM Rules/Calculators/A&P HEAR Score: 43                        65 year old female with history of coronary artery disease who presents with chest pain that started when she was working in her yard this afternoon.  Differential diagnosis for this patient's symptoms includes but is not limited to ACS, PE, Pneumonia, MSK pain, dissection.   Vital signs are normal intake.  Cardiopulmonary exam is normal, abdominal exam is benign.  Brief neurovascular exam is normal.  CBC is unremarkable, BMP with elevated creatinine the patient baseline, troponin negative, less than 2.  Chest x-ray negative for acute cardiopulmonary disease, EKG reassuring, without STEMI.  Given elevated heart score, will await delta troponin and reevaluate.  Patient was administered nitroglycerin with brief relief in her symptoms, however she is unsure if nitro truly contributed to it or if the relief is related to the waxing and waning nature of the pain.  Delta troponin negative, but given nature of pain,  feel patient would benefit from admission to the hospital.  Consults placed to cardiology and hospitalist. Patient will be transferred for admission at [facility] for chest pain.   Nzinga voiced understanding of her medical evaluation and treatment plan, Each of her questions was answered to her expressed satisfaction. She is amenable to plan for admission at this time.   This chart was dictated using voice recognition software, Dragon. Despite the best efforts of this provider to proofread and correct errors, errors may still occur which can change documentation meaning.  Final Clinical Impression(s) / ED Diagnoses Final diagnoses:  None    Rx / DC Orders ED Discharge Orders    None       Aura Dials 08/26/20 2236    Gareth Morgan, MD 08/28/20 1223

## 2020-08-26 NOTE — ED Notes (Signed)
Pt was pain-free for approx 10-12 minutes after NTG administered, followed by another episode of the same intensity as before. EDP notified; will hold other NTG at this time.

## 2020-08-26 NOTE — ED Notes (Signed)
MSE waiver signing deferred due to pt condition. Pt taken directly to room 10 for triage. Dr. Dina Rich given EKG to review

## 2020-08-27 ENCOUNTER — Observation Stay (HOSPITAL_BASED_OUTPATIENT_CLINIC_OR_DEPARTMENT_OTHER): Payer: Medicare Other

## 2020-08-27 ENCOUNTER — Observation Stay (HOSPITAL_COMMUNITY): Payer: Medicare Other

## 2020-08-27 ENCOUNTER — Other Ambulatory Visit: Payer: Self-pay

## 2020-08-27 DIAGNOSIS — R0789 Other chest pain: Secondary | ICD-10-CM | POA: Diagnosis not present

## 2020-08-27 DIAGNOSIS — I10 Essential (primary) hypertension: Secondary | ICD-10-CM

## 2020-08-27 DIAGNOSIS — E039 Hypothyroidism, unspecified: Secondary | ICD-10-CM

## 2020-08-27 DIAGNOSIS — E785 Hyperlipidemia, unspecified: Secondary | ICD-10-CM | POA: Diagnosis not present

## 2020-08-27 DIAGNOSIS — E1122 Type 2 diabetes mellitus with diabetic chronic kidney disease: Secondary | ICD-10-CM | POA: Diagnosis not present

## 2020-08-27 DIAGNOSIS — R079 Chest pain, unspecified: Secondary | ICD-10-CM | POA: Diagnosis not present

## 2020-08-27 DIAGNOSIS — I4891 Unspecified atrial fibrillation: Secondary | ICD-10-CM

## 2020-08-27 DIAGNOSIS — Z20822 Contact with and (suspected) exposure to covid-19: Secondary | ICD-10-CM | POA: Diagnosis not present

## 2020-08-27 DIAGNOSIS — J45909 Unspecified asthma, uncomplicated: Secondary | ICD-10-CM | POA: Diagnosis not present

## 2020-08-27 DIAGNOSIS — I208 Other forms of angina pectoris: Secondary | ICD-10-CM | POA: Diagnosis not present

## 2020-08-27 HISTORY — DX: Unspecified atrial fibrillation: I48.91

## 2020-08-27 LAB — CBC
HCT: 34.1 % — ABNORMAL LOW (ref 36.0–46.0)
Hemoglobin: 11.4 g/dL — ABNORMAL LOW (ref 12.0–15.0)
MCH: 29.5 pg (ref 26.0–34.0)
MCHC: 33.4 g/dL (ref 30.0–36.0)
MCV: 88.3 fL (ref 80.0–100.0)
Platelets: 213 10*3/uL (ref 150–400)
RBC: 3.86 MIL/uL — ABNORMAL LOW (ref 3.87–5.11)
RDW: 13.3 % (ref 11.5–15.5)
WBC: 6.2 10*3/uL (ref 4.0–10.5)
nRBC: 0 % (ref 0.0–0.2)

## 2020-08-27 LAB — ECHOCARDIOGRAM COMPLETE
AR max vel: 2.06 cm2
AV Area VTI: 2.05 cm2
AV Area mean vel: 2.1 cm2
AV Mean grad: 5 mmHg
AV Peak grad: 9.9 mmHg
Ao pk vel: 1.57 m/s
Area-P 1/2: 6.27 cm2
Height: 65 in
S' Lateral: 2.6 cm
Weight: 2998.26 oz

## 2020-08-27 LAB — COMPREHENSIVE METABOLIC PANEL
ALT: 23 U/L (ref 0–44)
AST: 19 U/L (ref 15–41)
Albumin: 3.4 g/dL — ABNORMAL LOW (ref 3.5–5.0)
Alkaline Phosphatase: 58 U/L (ref 38–126)
Anion gap: 7 (ref 5–15)
BUN: 15 mg/dL (ref 8–23)
CO2: 26 mmol/L (ref 22–32)
Calcium: 9 mg/dL (ref 8.9–10.3)
Chloride: 107 mmol/L (ref 98–111)
Creatinine, Ser: 1.28 mg/dL — ABNORMAL HIGH (ref 0.44–1.00)
GFR, Estimated: 46 mL/min — ABNORMAL LOW (ref >60)
Glucose, Bld: 188 mg/dL — ABNORMAL HIGH (ref 70–99)
Potassium: 3.8 mmol/L (ref 3.5–5.1)
Sodium: 140 mmol/L (ref 135–145)
Total Bilirubin: 0.5 mg/dL (ref 0.3–1.2)
Total Protein: 6.1 g/dL — ABNORMAL LOW (ref 6.5–8.1)

## 2020-08-27 LAB — NM MYOCAR MULTI W/SPECT W/WALL MOTION / EF
Estimated workload: 1 METS
MPHR: 114 {beats}/min
Peak HR: 114 {beats}/min
Percent HR: 73 %
Rest HR: 60 {beats}/min

## 2020-08-27 LAB — LIPID PANEL
Cholesterol: 210 mg/dL — ABNORMAL HIGH (ref 0–200)
HDL: 38 mg/dL — ABNORMAL LOW (ref >40)
LDL Cholesterol: 121 mg/dL — ABNORMAL HIGH (ref 0–99)
Total CHOL/HDL Ratio: 5.5 RATIO
Triglycerides: 253 mg/dL — ABNORMAL HIGH (ref <150)
VLDL: 51 mg/dL — ABNORMAL HIGH (ref 0–40)

## 2020-08-27 LAB — HEPARIN LEVEL (UNFRACTIONATED): Heparin Unfractionated: 0.21 IU/mL — ABNORMAL LOW (ref 0.30–0.70)

## 2020-08-27 LAB — TROPONIN I (HIGH SENSITIVITY)
Troponin I (High Sensitivity): <2 ng/L (ref <18)
Troponin I (High Sensitivity): <2 ng/L (ref <18)

## 2020-08-27 LAB — GLUCOSE, CAPILLARY
Glucose-Capillary: 188 mg/dL — ABNORMAL HIGH (ref 70–99)
Glucose-Capillary: 164 mg/dL — ABNORMAL HIGH (ref 70–99)
Glucose-Capillary: 211 mg/dL — ABNORMAL HIGH (ref 70–99)

## 2020-08-27 LAB — T4, FREE: Free T4: 0.8 ng/dL (ref 0.61–1.12)

## 2020-08-27 LAB — HIV ANTIBODY (ROUTINE TESTING W REFLEX): HIV Screen 4th Generation wRfx: NONREACTIVE

## 2020-08-27 LAB — TSH: TSH: 19.142 u[IU]/mL — ABNORMAL HIGH (ref 0.350–4.500)

## 2020-08-27 MED ORDER — DILTIAZEM HCL-DEXTROSE 125-5 MG/125ML-% IV SOLN (PREMIX)
5.0000 mg/h | INTRAVENOUS | Status: DC
  Administered 2020-08-27: 5 mg/h via INTRAVENOUS
  Filled 2020-08-27: qty 125

## 2020-08-27 MED ORDER — HEPARIN (PORCINE) 25000 UT/250ML-% IV SOLN
1200.0000 [IU]/h | INTRAVENOUS | Status: DC
  Administered 2020-08-27: 1050 [IU]/h via INTRAVENOUS
  Administered 2020-08-28: 1200 [IU]/h via INTRAVENOUS
  Filled 2020-08-27 (×2): qty 250

## 2020-08-27 MED ORDER — TECHNETIUM TC 99M TETROFOSMIN IV KIT
30.0000 | PACK | Freq: Once | INTRAVENOUS | Status: AC | PRN
  Administered 2020-08-27: 30 via INTRAVENOUS

## 2020-08-27 MED ORDER — REGADENOSON 0.4 MG/5ML IV SOLN
0.4000 mg | Freq: Once | INTRAVENOUS | Status: AC
  Filled 2020-08-27: qty 5

## 2020-08-27 MED ORDER — TECHNETIUM TC 99M TETROFOSMIN IV KIT
10.0000 | PACK | Freq: Once | INTRAVENOUS | Status: AC | PRN
  Administered 2020-08-27: 10 via INTRAVENOUS

## 2020-08-27 MED ORDER — REGADENOSON 0.4 MG/5ML IV SOLN
INTRAVENOUS | Status: AC
  Administered 2020-08-27: 0.4 mg via INTRAVENOUS
  Filled 2020-08-27: qty 5

## 2020-08-27 MED ORDER — HEPARIN BOLUS VIA INFUSION
4000.0000 [IU] | Freq: Once | INTRAVENOUS | Status: AC
  Administered 2020-08-27: 4000 [IU] via INTRAVENOUS
  Filled 2020-08-27: qty 4000

## 2020-08-27 NOTE — Progress Notes (Signed)
Paged by nursing staff regarding new onset of atrial fibrillation after the patient returned from echo today.  Initial systolic blood pressure in the 170s.  Heart rate also in the 170s to 180s.  Both of her metoprolol and diltiazem were held this morning due to bradycardia.  I have resumed oral metoprolol and diltiazem.  The during the meantime, I started her on IV diltiazem which can potentially been weaned in the next hour.  After IV diltiazem was started, she did convert back into sinus rhythm however did not hold sinus rhythm for long.  She is still converting back and forth between sinus rhythm and atrial fibrillation at this time.  While in atrial fibrillation, patient complained of chest pressure, palpitation, chills and fatigue.  She says this is different from her chest discomfort that brought her to the hospital.  IV heparin started.  Discharge will need to be held off.

## 2020-08-27 NOTE — Progress Notes (Signed)
PROGRESS NOTE    [name] [name]  RFF:[id number] DOB: 1955-07-03 DOA: 08/26/2020 PCP: Ma Hillock, DO  Outpatient Specialists:     Brief Narrative:  As per H&P done by Dr. Queen Blossom is a 65 y.o. female with history of CAD status post stenting, hypertension hyperlipidemia hypothyroidism and diabetes mellitus was brought to the ER at [facility] with complaint of chest pain.  Patient chest pain happened around 1:15 PM today while working in the yard.  Patient was picking up broken limbs of the tree when patient started having substernal chest pressure nonradiating squeezing in type which became more intense and started moving across the chest.  Patient took some sublingual nitroglycerin with no much relief.  At this point patient decided come to the ER.  Did not have any productive cough fever chills or shortness of breath.  ED Course: In the ER patient had chest pain and was given another dose of sublingual nitroglycerin which did not immediately relieve pain but subsequently patient became chest pain-free.  EKG shows normal sinus rhythm low voltage chest x-ray unremarkable Covid test negative.  High sensitive troponins were negative.  Rest of the labs are largely unremarkable.  Patient admitted for further work-up of chest pain concerning for angina".  08/27/2020: Cardiology input is appreciated.  Troponins came back negative.  No clear stress test came back negative.  Echocardiogram revealed grade 1 diastolic dysfunction.  Prior to discharge home, patient went into A. fib RVR.  Discharge will be held.  Cardiology is starting patient on Cardizem and anticoagulation.  Further management will depend on hospital course.  Assessment & Plan:   Principal Problem:   Chest pain Active Problems:   Hypothyroidism   Hyperlipidemia LDL goal <70   Hypertension   Unstable angina (HCC)  Chest pain: -Chest pain has resolved. -Troponin is again negative. -Nuclear  stress test came back negative. -Echocardiogram came back negative.  Atrial fibrillation with rapid ventricular response: -This is new finding for patient. -Cardizem and anticoagulation. -We will check D-dimer.  Hypothyroidism: Patient is currently on Synthroid. TSH was 19.142 this is elevated) Free T4 was 0.8 (normal range)  Diabetes mellitus: -Continue to optimize. -Patient is on sliding scale insulin.  Hypertension: -Continue to optimize. -Goal blood pressure should be less than 130/80 mmHg  Hyperlipidemia: -Patient is on a study drug. -Patient also takes WelChol and fenofibrate.    DVT prophylaxis: Lovenox. Code Status: Full code Family Communication: Husband Disposition Plan: Home eventually.   Consultants:   Cardiology  Procedures:   Nuclear stress test.  Echocardiogram  Antimicrobials:   No complaints.   Subjective: No chest pain. No shortness of breath. No fever or chills.  Objective: Vitals:   08/27/20 1101 08/27/20 1108 08/27/20 1110 08/27/20 1112  BP: (!) 141/68 (!) 155/69 (!) 163/66 (!) 154/60  Pulse:      Resp:      Temp:      TempSrc:      SpO2:      Weight:      Height:        Intake/Output Summary (Last 24 hours) at 08/27/2020 1514 Last data filed at 08/26/2020 2051 Gross per 24 hour  Intake 360 ml  Output --  Net 360 ml   Filed Weights   08/26/20 1406 08/26/20 2034 08/27/20 0547  Weight: 80.7 kg 82.5 kg 85 kg    Examination:  General exam: Appears calm and comfortable  Respiratory system: Clear to auscultation.  Cardiovascular system:  S1 & S2, tachycardic.  Gastrointestinal system: Abdomen is nondistended, soft and nontender. No organomegaly or masses felt. Normal bowel sounds heard. Central nervous system: Alert and oriented. No focal neurological deficits. Extremities: Symmetric 5 x 5 power.  Data Reviewed: I have personally reviewed following labs and imaging studies  CBC: Recent Labs  Lab 08/26/20 1412  08/27/20 0119  WBC 7.1 6.2  HGB 12.9 11.4*  HCT 38.3 34.1*  MCV 87.0 88.3  PLT 284 481   Basic Metabolic Panel: Recent Labs  Lab 08/26/20 1412 08/27/20 0119  NA 137 140  K 3.6 3.8  CL 102 107  CO2 23 26  GLUCOSE 177* 188*  BUN 19 15  CREATININE 1.24* 1.28*  CALCIUM 9.4 9.0   GFR: Estimated Creatinine Clearance: 47.2 mL/min (A) (by C-G formula based on SCr of 1.28 mg/dL (H)). Liver Function Tests: Recent Labs  Lab 08/27/20 0119  AST 19  ALT 23  ALKPHOS 58  BILITOT 0.5  PROT 6.1*  ALBUMIN 3.4*   No results for input(s): LIPASE, AMYLASE in the last 168 hours. No results for input(s): AMMONIA in the last 168 hours. Coagulation Profile: No results for input(s): INR, PROTIME in the last 168 hours. Cardiac Enzymes: No results for input(s): CKTOTAL, CKMB, CKMBINDEX, TROPONINI in the last 168 hours. BNP (last 3 results) No results for input(s): PROBNP in the last 8760 hours. HbA1C: No results for input(s): HGBA1C in the last 72 hours. CBG: Recent Labs  Lab 08/26/20 2115 08/27/20 1459  GLUCAP 192* 188*   Lipid Profile: Recent Labs    08/27/20 0119  CHOL 210*  HDL 38*  LDLCALC 121*  TRIG 253*  CHOLHDL 5.5   Thyroid Function Tests: Recent Labs    08/26/20 2309 08/27/20 0639  TSH 19.142*  --   FREET4  --  0.80   Anemia Panel: No results for input(s): VITAMINB12, FOLATE, FERRITIN, TIBC, IRON, RETICCTPCT in the last 72 hours. Urine analysis:    Component Value Date/Time   COLORURINE YELLOW 02/25/2020 1154   APPEARANCEUR CLEAR 02/25/2020 1154   LABSPEC 1.022 02/25/2020 1154   PHURINE 5.5 02/25/2020 1154   GLUCOSEU 2+ (A) 02/25/2020 1154   GLUCOSEU NEGATIVE 12/29/2013 0734   HGBUR NEGATIVE 02/25/2020 1154   HGBUR negative 08/08/2009 1437   BILIRUBINUR NEGATIVE 11/28/2014 1125   KETONESUR NEGATIVE 02/25/2020 1154   PROTEINUR NEGATIVE 02/25/2020 1154   UROBILINOGEN 0.2 11/28/2014 1125   NITRITE NEGATIVE 11/28/2014 1125   LEUKOCYTESUR NEGATIVE  11/28/2014 1125   Sepsis Labs: @LABRCNTIP (procalcitonin:4,lacticidven:4)  ) Recent Results (from the past 240 hour(s))  Resp Panel by RT-PCR (Flu A&B, Covid) Nasopharyngeal Swab     Status: None   Collection Time: 08/26/20  6:20 PM   Specimen: Nasopharyngeal Swab; Nasopharyngeal(NP) swabs in vial transport medium  Result Value Ref Range Status   SARS Coronavirus 2 by RT PCR NEGATIVE NEGATIVE Final    Comment: (NOTE) SARS-CoV-2 target nucleic acids are NOT DETECTED.  The SARS-CoV-2 RNA is generally detectable in upper respiratory specimens during the acute phase of infection. The lowest concentration of SARS-CoV-2 viral copies this assay can detect is 138 copies/mL. A negative result does not preclude SARS-Cov-2 infection and should not be used as the sole basis for treatment or other patient management decisions. A negative result may occur with  improper specimen collection/handling, submission of specimen other than nasopharyngeal swab, presence of viral mutation(s) within the areas targeted by this assay, and inadequate number of viral copies(<138 copies/mL). A negative result must be combined  with clinical observations, patient history, and epidemiological information. The expected result is Negative.  Fact Sheet for Patients:  EntrepreneurPulse.com.au  Fact Sheet for Healthcare Providers:  IncredibleEmployment.be  This test is no t yet approved or cleared by the [place] FDA and  has been authorized for detection and/or diagnosis of SARS-CoV-2 by FDA under an Emergency Use Authorization (EUA). This EUA will remain  in effect (meaning this test can be used) for the duration of the COVID-19 declaration under Section 564(b)(1) of the Act, 21 U.S.C.section 360bbb-3(b)(1), unless the authorization is terminated  or revoked sooner.       Influenza A by PCR NEGATIVE NEGATIVE Final   Influenza B by PCR NEGATIVE NEGATIVE Final     Comment: (NOTE) The Xpert Xpress SARS-CoV-2/FLU/RSV plus assay is intended as an aid in the diagnosis of influenza from Nasopharyngeal swab specimens and should not be used as a sole basis for treatment. Nasal washings and aspirates are unacceptable for Xpert Xpress SARS-CoV-2/FLU/RSV testing.  Fact Sheet for Patients: EntrepreneurPulse.com.au  Fact Sheet for Healthcare Providers: IncredibleEmployment.be  This test is not yet approved or cleared by the [place] FDA and has been authorized for detection and/or diagnosis of SARS-CoV-2 by FDA under an Emergency Use Authorization (EUA). This EUA will remain in effect (meaning this test can be used) for the duration of the COVID-19 declaration under Section 564(b)(1) of the Act, 21 U.S.C. section 360bbb-3(b)(1), unless the authorization is terminated or revoked.  Performed at [facility], [address], [place], [place] [address]          Radiology Studies: DG Chest 2 View  Result Date: 08/26/2020 CLINICAL DATA:  Chest pain. EXAM: CHEST - 2 VIEW COMPARISON:  03/18/2018. FINDINGS: The heart size and mediastinal contours are within normal limits and similar to prior. Both lungs are clear. No visible pleural effusions or pneumothorax. No acute osseous abnormality. Similar appearance of the chest in comparison to prior. IMPRESSION: No active cardiopulmonary disease. Electronically Signed   By: Margaretha Sheffield MD   On: 08/26/2020 14:38   NM Myocar Multi W/Spect W/Wall Motion / EF  Result Date: 08/27/2020 CLINICAL DATA:  Chest pain EXAM: MYOCARDIAL IMAGING WITH SPECT (REST AND PHARMACOLOGIC-STRESS) GATED LEFT VENTRICULAR WALL MOTION STUDY LEFT VENTRICULAR EJECTION FRACTION TECHNIQUE: Standard myocardial SPECT imaging was performed after resting intravenous injection of 10.4 mCi Tc-29m tetrofosmin. Subsequently, intravenous infusion of Lexiscan was performed under the supervision of the  Cardiology staff. At peak effect of the drug, 32 mCi Tc-37m tetrofosmin was injected intravenously and standard myocardial SPECT imaging was performed. Quantitative gated imaging was also performed to evaluate left ventricular wall motion, and estimate left ventricular ejection fraction. COMPARISON:  None. FINDINGS: Perfusion: No decreased activity in the left ventricle on stress imaging to suggest reversible ischemia or infarction. Wall Motion: Normal left ventricular wall motion. No left ventricular dilation. Left Ventricular Ejection Fraction: 76 % End diastolic volume 59 ml End systolic volume 14 ml IMPRESSION: 1. No reversible ischemia or infarction. 2. Normal left ventricular wall motion. 3. Left ventricular ejection fraction 76% 4. Non invasive risk stratification*: Low *2012 Appropriate Use Criteria for Coronary Revascularization Focused Update: J Am Coll Cardiol. 9211;94(1):740-814. http://content.airportbarriers.com.aspx?articleid=[email or web address] Electronically Signed   By: Dorise Bullion III M.D   On: 08/27/2020 12:48   ECHOCARDIOGRAM COMPLETE  Result Date: 08/27/2020    ECHOCARDIOGRAM REPORT   Patient Name:   [name] [name] Date of Exam: 08/27/2020 Medical Rec #:  [id number]  Height:       65.0 in Accession #:    [id number]      Weight:       187.4 lb Date of Birth:  [birth year]        BSA:          1.924 m Patient Age:    62 years        BP:           125/55 mmHg Patient Gender: F               HR:           91 bpm. Exam Location:  Inpatient Procedure: 2D Echo, Cardiac Doppler and Color Doppler Indications:    R07.89 Other chest pain  History:        Patient has prior history of Echocardiogram examinations, most                 recent 10/07/2017.  Sonographer:    Merrie Roof RDCS Referring Phys: [id number] [facility]  Sonographer Comments: Technically difficult study due to poor echo windows. IMPRESSIONS  1. Left ventricular ejection fraction, by estimation, is 65 to 70%. The left ventricle has  normal function. The left ventricle has no regional wall motion abnormalities. There is mild left ventricular hypertrophy. Left ventricular diastolic parameters are consistent with Grade I diastolic dysfunction (impaired relaxation).  2. Right ventricular systolic function is normal. The right ventricular size is normal.  3. The mitral valve is normal in structure. Trivial mitral valve regurgitation. No evidence of mitral stenosis.  4. The aortic valve is normal in structure. Aortic valve regurgitation is not visualized. No aortic stenosis is present. Comparison(s): A prior study was performed on 10/07/2017. No significant change from prior study. Prior images reviewed side by side. Trivial pericaridal effusion with prominent epicardial adipose layer is unchanged. FINDINGS  Left Ventricle: Left ventricular ejection fraction, by estimation, is 65 to 70%. The left ventricle has normal function. The left ventricle has no regional wall motion abnormalities. The left ventricular internal cavity size was normal in size. There is  mild left ventricular hypertrophy. Left ventricular diastolic parameters are consistent with Grade I diastolic dysfunction (impaired relaxation). Right Ventricle: The right ventricular size is normal. No increase in right ventricular wall thickness. Right ventricular systolic function is normal. Left Atrium: Left atrial size was normal in size. Right Atrium: Right atrial size was normal in size. Pericardium: Trivial pericardial effusion is present. Presence of pericardial fat pad. Mitral Valve: The mitral valve is normal in structure. Trivial mitral valve regurgitation. No evidence of mitral valve stenosis. Tricuspid Valve: The tricuspid valve is normal in structure. Tricuspid valve regurgitation is trivial. No evidence of tricuspid stenosis. Aortic Valve: The aortic valve is normal in structure. Aortic valve regurgitation is not visualized. No aortic stenosis is present. Aortic valve mean  gradient measures 5.0 mmHg. Aortic valve peak gradient measures 9.9 mmHg. Aortic valve area, by VTI measures 2.05 cm. Pulmonic Valve: The pulmonic valve was normal in structure. Pulmonic valve regurgitation is trivial. No evidence of pulmonic stenosis. Aorta: The aortic root and ascending aorta are structurally normal, with no evidence of dilitation. Venous: The inferior vena cava was not well visualized. IAS/Shunts: The interatrial septum was not well visualized.  LEFT VENTRICLE PLAX 2D LVIDd:         4.10 cm  Diastology LVIDs:         2.60 cm  LV e' medial:    7.07 cm/s LV  PW:         1.10 cm  LV E/e' medial:  12.4 LV IVS:        1.00 cm  LV e' lateral:   8.38 cm/s LVOT diam:     1.90 cm  LV E/e' lateral: 10.4 LV SV:         74 LV SV Index:   38 LVOT Area:     2.84 cm  RIGHT VENTRICLE RV Basal diam:  2.70 cm LEFT ATRIUM             Index       RIGHT ATRIUM           Index LA diam:        3.40 cm 1.77 cm/m  RA Area:     14.90 cm LA Vol (A2C):   42.2 ml 21.93 ml/m RA Volume:   32.70 ml  16.99 ml/m LA Vol (A4C):   50.5 ml 26.25 ml/m LA Biplane Vol: 50.5 ml 26.25 ml/m  AORTIC VALVE AV Area (Vmax):    2.06 cm AV Area (Vmean):   2.10 cm AV Area (VTI):     2.05 cm AV Vmax:           157.00 cm/s AV Vmean:          104.000 cm/s AV VTI:            0.359 m AV Peak Grad:      9.9 mmHg AV Mean Grad:      5.0 mmHg LVOT Vmax:         114.00 cm/s LVOT Vmean:        77.100 cm/s LVOT VTI:          0.260 m LVOT/AV VTI ratio: 0.72  AORTA Ao Root diam: 3.50 cm MITRAL VALVE MV Area (PHT): 6.27 cm    SHUNTS MV Decel Time: 121 msec    Systemic VTI:  0.26 m MV E velocity: 87.40 cm/s  Systemic Diam: 1.90 cm Cherlynn Kaiser MD Electronically signed by Cherlynn Kaiser MD Signature Date/Time: 08/27/2020/2:59:48 PM    Final         Scheduled Meds: . aspirin  81 mg Oral Daily  . colesevelam  1,250 mg Oral Daily  . dicyclomine  10 mg Oral QHS  . famotidine  20 mg Oral QHS  . fenofibrate  160 mg Oral Daily  . insulin aspart   0-9 Units Subcutaneous TID WC  . levothyroxine  168 mcg Oral Once per day on Sun  . [START ON 08/28/2020] levothyroxine  88 mcg Oral Once per day on Mon Tue Wed Thu Fri Sat  . metoprolol tartrate  25 mg Oral BID  . pantoprazole  40 mg Oral BID   Continuous Infusions: . diltiazem (CARDIZEM) infusion       LOS: 0 days    Time spent: 35 minutes    Dana Allan, MD  [facility] Pager #: [phone] 7PM-7AM contact night coverage as above

## 2020-08-27 NOTE — Progress Notes (Signed)
ANTICOAGULATION CONSULT NOTE - Initial Consult  Pharmacy Consult for heparin Indication: afib  Allergies  Allergen Reactions  . Epinephrine Other (See Comments)    Pass out   . Statins Other (See Comments)    Myalgias: Simvastatin also caused back ache  . Welchol [Colesevelam Hcl]     myalgia  . Amoxicillin Itching and Rash    Has patient had a PCN reaction causing immediate rash, facial/tongue/throat swelling, SOB or lightheadedness with hypotension: No Has patient had a PCN reaction causing severe rash involving mucus membranes or skin necrosis: No Has patient had a PCN reaction that required hospitalization: No Has patient had a PCN reaction occurring within the last 10 years: Yes If all of the above answers are "NO", then may proceed with Cephalosporin use.     Patient Measurements: Height: 5\' 5"  (165.1 cm) Weight: 85 kg (187 lb 6.3 oz) IBW/kg (Calculated) : 57 Heparin Dosing Weight: 75kg  Vital Signs: Temp: 98 F (36.7 C) (04/03 0828) Temp Source: Oral (04/03 0828) BP: 131/90 (04/03 1516) Pulse Rate: 55 (04/03 0828)  Labs: Recent Labs    08/26/20 1412 08/26/20 1623 08/26/20 2309 08/27/20 0119  HGB 12.9  --   --  11.4*  HCT 38.3  --   --  34.1*  PLT 284  --   --  213  CREATININE 1.24*  --   --  1.28*  TROPONINIHS <2 <2 <2 <2    Estimated Creatinine Clearance: 47.2 mL/min (A) (by C-G formula based on SCr of 1.28 mg/dL (H)).   Medical History: Past Medical History:  Diagnosis Date  . Accelerating angina ([place]) 11/04/2017  . Asthma   . Chronic pain disorder    neck and shoulder  . CTS (carpal tunnel syndrome) 01/27/2013   right  . Diabetes mellitus type II   . Diabetic gastroparesis ([place])   . Esophageal reflux 08/25/2013   [place], Dr Barth Kirks  . FATTY LIVER DISEASE 04/27/2007   Qualifier: Diagnosis of  By: Danelle Earthly CMA, Darlene    . Focal muscle atrophy 09/26/2015  . Fundic gland polyps of stomach, benign   . HTN (hypertension)   .  Hyperlipidemia, mixed 05/04/2007   Qualifier: Diagnosis of  By: Wynona Luna crestor caused myalgias even at low dose Livalo caused myalgias Lipitor  Mother with severe reaction myalgias Simvastatin, Welchol   . Hypertension 08/24/2010  . Hypokalemia 05/25/2013   Improved stopping HCTZ. Was noted to have an elevated glucose when K was low. Recheck renal next week after starting Maxzide  . Hypomagnesemia 08/10/2014  . Hypothyroidism   . Iron deficiency anemia 08/07/2018  . Iron malabsorption 08/07/2018  . Laryngitis 08/25/2017  . Leg cramps 04/15/2016  . Nausea without vomiting 08/08/2014  . NONSPEC ELEVATION OF LEVELS OF TRANSAMINASE/LDH 05/01/2007   Qualifier: Diagnosis of  By: Garner Gavel    . Osteoarthritis    chronic, right knee (11/04/2017)  . OVARIAN CYST 07/11/2009   Qualifier: Diagnosis of  By: Wynona Luna   . Ovarian cyst   . Plantar fasciitis   . Rosacea 10/22/2016  . Urine incontinence     Assessment: 65 year old female with known CAD presenting with chest pain and recently ruled out for acs with negative CEs and stress test. Patient now with new onset afib with rvr, orders to start heparin for anticoagulation. She received 40mg  of lovenox last night. CBC within normal limits.   Goal of Therapy:  Heparin level 0.3-0.7 units/ml Monitor platelets  by anticoagulation protocol: Yes   Plan:  Give 4000 units bolus x 1 Start heparin infusion at 1050 units/hr Check anti-Xa level in 6 hours and daily while on heparin Continue to monitor H&H and platelets  Erin Hearing PharmD., BCPS Clinical Pharmacist 08/27/2020 3:24 PM

## 2020-08-27 NOTE — Significant Event (Signed)
Rapid Response Event Note   Reason for Call :  Chest pain  Initial Focused Assessment:  Nursing staff alerted me that this patient had mentioned that she had jaw tingling, chest pressure, and extreme fatigue.  The patient appeared to be pale but not diaphoretic.  Her heart rhythm converted from sinus brady to atrial fib with rates as high as 190's.  She was able to convert back to NSR but would not sustain in a normal rhythm.  BP 117/67 EKG 92 O2 98 [place] 2L RR 18 CBG 188   Interventions:  IV diltiazem gtt, oral diltiazem and lopressor resumed, 12-lead EKG, IV access established, and IV heparin gtt initiated  Plan of Care:  Patient was originally scheduled for discharge today but that will be postponed till MD deems safe.   Event Summary:   MD Notified: Cards at bedside Call Time: On the unit Arrival Time: End Time: [place], RN

## 2020-08-27 NOTE — Consult Note (Addendum)
Cardiology Consultation:   Patient ID: [name] [name] MRN: [id number]; DOB: June 30, 1955  Admit date: 08/26/2020 Date of Consult: 08/27/2020  PCP:  Ma Hillock, [place]  Cardiologist:  Minus Breeding, MD    Patient Profile:   [name] [name] is a 65 y.o. female with past medical history of CAD (s/p DES to ostial/proximal LAD in 10/2017 with occlusion of small D1 branch, low-risk GXT in 11/2019 but did not achieve target HR), HTN, HLD, Hypothyroidism and Type 2 DM who is being seen today for the evaluation of chest pain at the request of Dr. Hal Hope.  History of Present Illness:   [name] [name] was last examined by Dr. Percival Spanish in 10/2019 and denied any recent chest pain at that time but had been experiencing more dyspnea on exertion. A GXT was therefore arranged and while she had moderately impaired exercise tolerance, there were no EKG changes and continued medical therapy was recommended.  She is also [facility] and is on [place] given prior intolerances to statin therapy.   She presented to [facility] [place] on 08/26/2020 for evaluation of chest pain. She reports being in her usual state of health until yesterday when she was helping her husband pick up sticks in the yard. She developed sudden-onset sharp pain along her right pectoral region. Reports she felt chilled at the time and also had paraesthesias down her right arm. She took a SL NTG and feels like this calmed her but pain did not fully resolve, therefore she went to the ED for further evaluation. Was given additional SL NTG in the ED with minimal improvement. She reports the sharp pain has resolved but overnight and this morning she has continued to have pressure in her chest which now radiates across her entire precordium. Says this is mild but still noticeable at rest. Reports her prior anginal symptom in 2019 was dyspnea on exertion and she did not have pain at that time. No  reports of dyspnea on exertion, orthopnea, PND or edema. She does have known acid reflux and takes Pepcid 20mg  daily along with Protonix 40mg  BID. Reports her symptoms yesterday did not resemble her prior reflux.   Initial labs show WBC 7.1, Hgb 12.9, platelets 284, Na+ 137, K+ 3.6 and creatinine 1.24. TSH 19.142 with Free T4 pending. COVID negative. Initial and repeat HS Troponin values have been negative at < 2. CXR with no active disease. EKG shows NSR, HR 74 with no acute ST abnormalities when compared to prior tracings. She has been monitored on telemetry overnight with HR in the 50's to 60's and no significant arrhythmias.    Past Medical History:  Diagnosis Date  . Accelerating angina ([place]) 11/04/2017  . Asthma   . Chronic pain disorder    neck and shoulder  . CTS (carpal tunnel syndrome) 01/27/2013   right  . Diabetes mellitus type II   . Diabetic gastroparesis ([place])   . Esophageal reflux 08/25/2013   [place], Dr Barth Kirks  . FATTY LIVER DISEASE 04/27/2007   Qualifier: Diagnosis of  By: Danelle Earthly CMA, Darlene    . Focal muscle atrophy 09/26/2015  . Fundic gland polyps of stomach, benign   . HTN (hypertension)   . Hyperlipidemia, mixed 05/04/2007   Qualifier: Diagnosis of  By: Wynona Luna crestor caused myalgias even at low dose Livalo caused myalgias Lipitor  Mother with severe reaction myalgias Simvastatin, Welchol   . Hypertension 08/24/2010  .  Hypokalemia 05/25/2013   Improved stopping HCTZ. Was noted to have an elevated glucose when K was low. Recheck renal next week after starting Maxzide  . Hypomagnesemia 08/10/2014  . Hypothyroidism   . Iron deficiency anemia 08/07/2018  . Iron malabsorption 08/07/2018  . Laryngitis 08/25/2017  . Leg cramps 04/15/2016  . Nausea without vomiting 08/08/2014  . NONSPEC ELEVATION OF LEVELS OF TRANSAMINASE/LDH 05/01/2007   Qualifier: Diagnosis of  By: Garner Gavel    . Osteoarthritis    chronic, right knee (11/04/2017)  . OVARIAN CYST  07/11/2009   Qualifier: Diagnosis of  By: Wynona Luna   . Ovarian cyst   . Plantar fasciitis   . Rosacea 10/22/2016  . Urine incontinence     Past Surgical History:  Procedure Laterality Date  . AUGMENTATION MAMMAPLASTY Bilateral 2002  . BLADDER SUSPENSION  1981  . COLONOSCOPY    . CORONARY STENT INTERVENTION N/A 11/04/2017   Procedure: CORONARY STENT INTERVENTION;  Surgeon: Nelva Bush, MD;  Location: [facility];  Service: Cardiovascular;  Laterality: N/A;  . ENDOVENOUS ABLATION SAPHENOUS VEIN W/ LASER Left 12/29/2019   endovenous laser ablation left greater saphenous vein by Ruta Hinds MD   . ESOPHAGOGASTRODUODENOSCOPY    . INTRAVASCULAR PRESSURE WIRE/FFR STUDY N/A 11/04/2017   Procedure: INTRAVASCULAR PRESSURE WIRE/FFR STUDY;  Surgeon: Nelva Bush, MD;  Location: [facility];  Service: Cardiovascular;  Laterality: N/A;  . INTRAVASCULAR ULTRASOUND/IVUS N/A 11/04/2017   Procedure: Intravascular Ultrasound/IVUS;  Surgeon: Nelva Bush, MD;  Location: [facility];  Service: Cardiovascular;  Laterality: N/A;  . KNEE ARTHROSCOPY  1998, [id number]  . LAPAROSCOPIC CHOLECYSTECTOMY  2007  . LEFT HEART CATH AND CORONARY ANGIOGRAPHY N/A 11/04/2017   Procedure: LEFT HEART CATH AND CORONARY ANGIOGRAPHY;  Surgeon: Nelva Bush, MD;  Location: [facility];  Service: Cardiovascular;  Laterality: N/A;  . LEFT HEART CATHETERIZATION WITH CORONARY ANGIOGRAM N/A 11/21/2011   Procedure: LEFT HEART CATHETERIZATION WITH CORONARY ANGIOGRAM;  Surgeon: Josue Hector, MD;  Location: [facility];  Service: Cardiovascular;  Laterality: N/A;  . TONSILLECTOMY  1975  . VAGINAL HYSTERECTOMY  1983   "still have my ovaries"     Home Medications:  Prior to Admission medications   Medication Sig Start Date End Date Taking? Authorizing Provider  acarbose (PRECOSE) 25 MG tablet Take 1 tablet (25 mg total) by mouth 3 (three) times daily with meals. 05/02/20   Renato Shin, MD  Accu-Chek Softclix Lancets lancets Use 4 times daily as needed or directed 03/09/20   Renato Shin, MD  aspirin 81 MG tablet Take 81 mg by mouth daily.     [provider]  Calcium Carbonate (CALTRATE 600 PO) Take 600 mg by mouth 2 (two) times daily.     [provider]  colesevelam (WELCHOL) 625 MG tablet Take 2 tablets (1,250 mg total) by mouth daily. 03/12/20   Renato Shin, MD  dicyclomine (BENTYL) 10 MG capsule 1 tab before breakfast and 1 tab prior to bed. 02/25/20   Kuneff, [name], DO  diltiazem (CARDIZEM CD) 240 MG 24 hr capsule TAKE 1 CAPSULE BY MOUTH EVERY DAY 06/06/20   Minus Breeding, MD  Evolocumab (REPATHA SURECLICK) 322 MG/ML SOAJ Inject 1 Dose into the skin every 14 (fourteen) days. 02/07/20   Hilty, Nadean Corwin, MD  famotidine (PEPCID) 20 MG tablet Take 1 tablet (20 mg total) by mouth at bedtime. 05/31/20   Gatha Mayer, MD  fenofibrate 160 MG tablet  Take 1 tablet (160 mg total) by mouth daily. 06/01/20   Kuneff, [name], DO  Glucosamine-Chondroitin (OSTEO BI-FLEX REGULAR STRENGTH) 250-200 MG TABS Take 1 tablet by mouth 2 (two) times daily.     [provider]  glucose blood (ACCU-CHEK GUIDE) test strip Use 4 times daily as needed or directed. 03/09/20   Renato Shin, MD  Krill Oil 350 MG CAPS Take 350 mg by mouth daily.     [provider]  levothyroxine (SYNTHROID) 88 MCG tablet Take one tablet daily on an empty stomach, 2 tabs on Saturday. 06/01/20   Kuneff, [name], DO  Magnesium 250 MG TABS Take 1 tablet by mouth daily.    [provider]  metFORMIN (GLUCOPHAGE) 500 MG tablet Take 500 mg by mouth daily.    [provider]  metoprolol tartrate (LOPRESSOR) 25 MG tablet Take 1 tablet (25 mg total) by mouth 2 (two) times daily. 06/06/20   Minus Breeding, MD  nitroGLYCERIN (NITROSTAT) 0.4 MG SL tablet Place 1 tablet (0.4 mg total) under the tongue every 5 (five) minutes as needed for chest pain. 08/25/17   Mosie Lukes,  MD  pantoprazole (PROTONIX) 40 MG tablet Take 1 tablet (40 mg total) by mouth 2 (two) times daily. 05/31/20   Gatha Mayer, MD  pioglitazone (ACTOS) 30 MG tablet Take 1 tablet (30 mg total) by mouth daily. 06/28/20   Renato Shin, MD  POTASSIUM CITRATE PO Take 99 mg by mouth daily.     [provider]  Probiotic Product (PROBIOTIC DAILY PO) Take 99 mg by mouth daily.     [provider]  repaglinide (PRANDIN) 2 MG tablet Take 2 mg by mouth in the morning and at bedtime.    [provider]    Inpatient Medications: Scheduled Meds: . aspirin  81 mg Oral Daily  . colesevelam  1,250 mg Oral Daily  . dicyclomine  10 mg Oral QHS  . diltiazem  240 mg Oral Daily  . enoxaparin (LOVENOX) injection  40 mg Subcutaneous Q24H  . famotidine  20 mg Oral QHS  . fenofibrate  160 mg Oral Daily  . insulin aspart  0-9 Units Subcutaneous TID WC  . levothyroxine  168 mcg Oral Once per day on Sun  . [START ON 08/28/2020] levothyroxine  88 mcg Oral Once per day on Mon Tue Wed Thu Fri Sat  . metoprolol tartrate  25 mg Oral BID  . pantoprazole  40 mg Oral BID   Continuous Infusions:  PRN Meds: acetaminophen **OR** acetaminophen, nitroGLYCERIN  Allergies:    Allergies  Allergen Reactions  . Epinephrine Other (See Comments)    Pass out   . Statins Other (See Comments)    Myalgias: Simvastatin also caused back ache  . Welchol [Colesevelam Hcl]     myalgia  . Amoxicillin Itching and Rash    Has patient had a PCN reaction causing immediate rash, facial/tongue/throat swelling, SOB or lightheadedness with hypotension: No Has patient had a PCN reaction causing severe rash involving mucus membranes or skin necrosis: No Has patient had a PCN reaction that required hospitalization: No Has patient had a PCN reaction occurring within the last 10 years: Yes If all of the above answers are "NO", then may proceed with Cephalosporin use.     Social History:   Social History    Socioeconomic History  . Marital status: Married    Spouse name: Not on file  . Number of children: 1  . Years  of education: Not on file  . Highest education level: Not on file  Occupational History  . Occupation: [employer]  Tobacco Use  . Smoking status: Never Smoker  . Smokeless tobacco: Never Used  Vaping Use  . Vaping Use: Never used  Substance and Sexual Activity  . Alcohol use: Not Currently  . Drug use: Never  . Sexual activity: Not Currently    Partners: Male  Other Topics Concern  . Not on file  Social History Narrative   Marital status/children/pets: Married, 1 child.    Education/employment: HS   Safety:      -smoke alarm in the home:Yes     - wears seatbelt: Yes     - Feels safe in their relationships: Yes   Social Determinants of Health   Financial Resource Strain: Not on file  Food Insecurity: Not on file  Transportation Needs: Not on file  Physical Activity: Not on file  Stress: Not on file  Social Connections: Not on file  Intimate Partner Violence: Not on file    Family History:    Family History  Problem Relation Age of Onset  . Alzheimer's disease Mother   . Diabetes type II Mother   . Hiatal hernia Mother   . Diabetes Mother   . Emphysema Father   . COPD Father   . Depression Sister        suicide  . Diabetes Sister   . Diabetes Daughter   . Colon cancer Neg Hx   . Stomach cancer Neg Hx      ROS:  Please see the history of present illness.   All other ROS reviewed and negative.     Physical Exam/Data:   Vitals:   08/26/20 2034 08/26/20 2336 08/27/20 0547 08/27/20 0828  BP: 137/68 (!) 114/50 121/72 (!) 125/55  Pulse: 84 67 (!) 53 (!) 55  Resp: 18 17 18 18   Temp: 98.1 F (36.7 C) 97.7 F (36.5 C) 98 F (36.7 C) 98 F (36.7 C)  TempSrc: Oral Oral Oral Oral  SpO2: 100% 100% 96% 96%  Weight: 82.5 kg  85 kg   Height: 5\' 5"  (1.651 m)       Intake/Output Summary (Last 24 hours) at 08/27/2020 0846 Last  data filed at 08/26/2020 2051 Gross per 24 hour  Intake 360 ml  Output --  Net 360 ml   Last 3 Weights 08/27/2020 08/26/2020 08/26/2020  Weight (lbs) 187 lb 6.3 oz 181 lb 12.8 oz 178 lb  Weight (kg) 85 kg 82.464 kg 80.74 kg     Body mass index is 31.18 kg/m.  General:  Well nourished, well developed female appearing in no acute distress. HEENT: normal Lymph: no adenopathy Neck: no JVD Endocrine:  No thryomegaly Vascular: No carotid bruits; FA pulses 2+ bilaterally without bruits  Cardiac:  normal S1, S2; RRR; no murmur. Lungs:  clear to auscultation bilaterally, no wheezing, rhonchi or rales  Abd: soft, nontender, no hepatomegaly  Ext: no edema Musculoskeletal:  No deformities, BUE and BLE strength normal and equal Skin: warm and dry  Neuro:  CNs 2-12 intact, no focal abnormalities noted Psych:  Normal affect   EKG:  The EKG was personally reviewed and demonstrates: NSR, HR 74 with no acute ST abnormalities when compared to prior tracings.   Telemetry:  Telemetry was personally reviewed and demonstrates: Sinus bradycardia, HR in 50's to 60's. No significant arrhythmias.   Relevant CV Studies:  Cardiac Catheterization: 10/2017 Conclusions: 1. Severe single-vessel  coronary artery disease with 60% ostial/proximal LAD stenosis (FFR 0.73, IVUS MLA 4.5 mm^2). 2. Mild to moderate, non-obstructive coronary artery disease involving ramus, LCx, and RCA. 3. Upper normal left ventricular filling pressure. 4. Successful FFR and IVUS-guided PCI to ostial/proximal LAD using overlapping Xience Sierra 3.0 x 18 mm and 2.75 x 8 mm drug-eluting stents with 0% residual stenosis and TIMI-3 flow. 5. Occlusion of small (<2 mm) D1 branch after being jailed by proximal LAD stent.  Chest pain improved with medical therapy; intervention was not attempted due to small vessel size.  Recommendations: 1. Dual antiplatelet therapy with aspirin and ticagrelor for at least 6 months, ideally  indefinitely. 2. Aggressive secondary prevention.  Consider retrial of statin or initiation of PCSK9 inhibitor. 3. Overnight extended recovery; likely discharge in the morning.   GXT: 11/2019  Blood pressure demonstrated a normal response to exercise.  There was no ST segment deviation noted during stress.  No T wave inversion was noted during stress.   Moderately impaired exercise tolerance, but otherwise normal ECG stress test.   Laboratory Data:  High Sensitivity Troponin:   Recent Labs  Lab 08/26/20 1412 08/26/20 1623 08/26/20 2309 08/27/20 0119  TROPONINIHS <2 <2 <2 <2     Chemistry Recent Labs  Lab 08/26/20 1412 08/26/20 2309 08/27/20 0119  NA 137  --  140  K 3.6  --  3.8  CL 102  --  107  CO2 23  --  26  GLUCOSE 177*  --  188*  BUN 19  --  15  CREATININE 1.24* 1.20* 1.28*  CALCIUM 9.4  --  9.0  GFRNONAA 48* 50* 46*  ANIONGAP 12  --  7    Recent Labs  Lab 08/27/20 0119  PROT 6.1*  ALBUMIN 3.4*  AST 19  ALT 23  ALKPHOS 58  BILITOT 0.5   Hematology Recent Labs  Lab 08/26/20 1412 08/26/20 2309 08/27/20 0119  WBC 7.1 6.3 6.2  RBC 4.40 4.02 3.86*  HGB 12.9 11.9* 11.4*  HCT 38.3 35.4* 34.1*  MCV 87.0 88.1 88.3  MCH 29.3 29.6 29.5  MCHC 33.7 33.6 33.4  RDW 13.3 13.2 13.3  PLT 284 229 213   BNPNo results for input(s): BNP, PROBNP in the last 168 hours.  DDimer No results for input(s): DDIMER in the last 168 hours.   Radiology/Studies:  DG Chest 2 View  Result Date: 08/26/2020 CLINICAL DATA:  Chest pain. EXAM: CHEST - 2 VIEW COMPARISON:  03/18/2018. FINDINGS: The heart size and mediastinal contours are within normal limits and similar to prior. Both lungs are clear. No visible pleural effusions or pneumothorax. No acute osseous abnormality. Similar appearance of the chest in comparison to prior. IMPRESSION: No active cardiopulmonary disease. Electronically Signed   By: Margaretha Sheffield MD   On: 08/26/2020 14:38     Assessment and Plan:    1. Chest Pain with Mixed Features - She presents with chest pain which occurred while doing yard work but did not improve with SL NTG or rest. Her pain has persisted throughout the night but has decreased in severity. Not associated with positional changes or reproducible.  - Initial and repeat HS Troponin values have been negative at < 2. EKG shows NSR, HR 74 with no acute ST abnormalities when compared to prior tracings. - Will plan to obtain an echocardiogram to assess LV function and wall motion. Will review with Dr. Margaretann Loveless in regards to inpatient vs. outpatient stress testing if echo is reassuring as her  symptoms could be secondary to known small-vessel disease. Would plan for this to be a [employer] given she did not achieve Target HR on prior GXT and she is on Lopressor and Cardizem CD. Cannot further titrate Lopressor given HR in the 50's.   2. CAD - She is s/p DES to ostial/proximal LAD in 10/2017 with occlusion of small D1 branch and most recent ischemic evaluation was a GXT in 11/2019 but she did not achieve Target HR.  - Continue ASA, Fenofibrate and Lopressor. She is intolerant to statin therapy but is on Repatha as an outpatient.   3. HTN - BP has overall been well-controlled at 114/50 - 139/63 within the past 24 hours. Continue current regimen with Cardizem CD 240mg  daily and Lopressor 25mg  BID. Would consider the addition of low-dose ACE-I or ARB as an outpatient given her concurrent Type 2 DM.   4. HLD - Most recent FLP in 03/2020 showed total cholesterol 228, triglycerides 353, HDL 47 and LDL 119. Will add-on FLP to AM labs.  - She is intolerant to statin therapy and on Repatha as an outpatient. Currently participating in the [place].   5. Type 2 DM - She reports her most recent Hgb A1c was 7.8. Followed by Endocrinology as an outpatient.   6. Hypothyroidism - TSH 19.142 with Free T4 pending. Followed by Endocrinology as an outpatient and will need to follow-up  with them upon discharge.     Risk Assessment/Risk Scores:     HEAR Score (for undifferentiated chest pain):  HEAR Score: 6   For questions or updates, please contact [place] Please consult [email or web address] for contact info under    Signed, Erma Heritage, PA-C  08/27/2020 8:46 AM  ---------------------------------------------------------------------------------------------   History and all data above reviewed.  Patient examined.  I agree with the findings as above with the following exceptions or changes.  [name] [name] is a 65 year old female with a history of diabetes mellitus type 2, fatty liver, hypertension, hyperlipidemia on Repatha and followed by Dr. Debara Pickett, hypothyroidism with a current TSH of 19 which she tells me her endocrinologist is aware of, iron deficiency anemia, CAD with prior PCI, and prior laser ablation of the left greater saphenous vein for leg discomfort.  She presents to the hospital with possible cardiac chest pain in the right side of the chest radiating to her right arm and then radiating to the left side of the chest.  ECG is unchanged from prior with ST-T wave nonspecific changes in anteroseptal leads, and troponins are negative x4.  Constitutional: No acute distress Eyes: pupils equally round and reactive to light, sclera non-icteric, normal conjunctiva and lids ENMT: normal dentition, moist mucous membranes Cardiovascular: regular rhythm, normal rate, no murmurs. S1 and S2 normal. Radial pulses normal bilaterally. No jugular venous distention.  Respiratory: clear to auscultation bilaterally GI : normal bowel sounds, soft and nontender. No distention.   MSK: extremities warm, well perfused. No edema.  NEURO: grossly nonfocal exam, moves all extremities. PSYCH: alert and oriented x 3, normal mood and affect.   All available labs, radiology testing, previous records reviewed. Agree with documented assessment and plan of my colleague as stated  above with the following additions or changes:  Principal Problem:   Chest pain Active Problems:   Hypothyroidism   Hyperlipidemia LDL goal <70   Hypertension   Unstable angina ([place])    Plan: We have discussed options for evaluation in detail.  The nurses walking her in the hallway  at the end of our evaluation and the patient notes left-sided chest pressure with exertion.  We have discussed performing an echocardiogram plus minus nuclear stress test.  The patient would like to find out what is going on and with her history of CAD with PCI, ischemic evaluation is warranted.  We will plan for nuclear stress test today as well as echocardiogram.  If no ischemia noted on stress test, symptoms may be consistent with worsening diastolic function and small vessel disease, could consider low-dose diuretic.  With her diabetes and CAD may want to consider adding SGLT2 inhibitor.   Length of Stay:  LOS: 0 days   Elouise Munroe, MD [facility] 9:14 AM  08/27/2020

## 2020-08-27 NOTE — Progress Notes (Signed)
Patient tolerated Lexiscan stress test well without significant complication.  Pending final result by [facility] radiology this afternoon.

## 2020-08-27 NOTE — Progress Notes (Signed)
Converted back to NSR shortly after the initiation of IV diltiazem. She was given oral diltiazem and metoprolol, once HR reaches 60 or below, ok to wean off IV diltiazem.   Rounding MD to discuss NOAC with the patient. Her CHA2DS2-Score is 5 (CAD, HTN, DM II, female, age 65)  Likely discharge tomorrow. Myoview and echo both reassuring. Will defer to rounding doctor to see if need outpatient heart monitor to assess afib burden.

## 2020-08-27 NOTE — Progress Notes (Signed)
  Echocardiogram 2D Echocardiogram has been performed.  Merrie Roof F 08/27/2020, 1:42 PM

## 2020-08-28 ENCOUNTER — Other Ambulatory Visit (HOSPITAL_COMMUNITY): Payer: Self-pay

## 2020-08-28 ENCOUNTER — Observation Stay (INDEPENDENT_AMBULATORY_CARE_PROVIDER_SITE_OTHER): Payer: Medicare Other

## 2020-08-28 DIAGNOSIS — I480 Paroxysmal atrial fibrillation: Secondary | ICD-10-CM

## 2020-08-28 DIAGNOSIS — I200 Unstable angina: Secondary | ICD-10-CM

## 2020-08-28 DIAGNOSIS — I10 Essential (primary) hypertension: Secondary | ICD-10-CM | POA: Diagnosis not present

## 2020-08-28 DIAGNOSIS — Z20822 Contact with and (suspected) exposure to covid-19: Secondary | ICD-10-CM | POA: Diagnosis not present

## 2020-08-28 DIAGNOSIS — R0789 Other chest pain: Secondary | ICD-10-CM | POA: Diagnosis not present

## 2020-08-28 DIAGNOSIS — E1122 Type 2 diabetes mellitus with diabetic chronic kidney disease: Secondary | ICD-10-CM | POA: Diagnosis not present

## 2020-08-28 DIAGNOSIS — J45909 Unspecified asthma, uncomplicated: Secondary | ICD-10-CM | POA: Diagnosis not present

## 2020-08-28 DIAGNOSIS — I208 Other forms of angina pectoris: Secondary | ICD-10-CM | POA: Diagnosis not present

## 2020-08-28 DIAGNOSIS — E785 Hyperlipidemia, unspecified: Secondary | ICD-10-CM | POA: Diagnosis not present

## 2020-08-28 LAB — CBC
HCT: 36.2 % (ref 36.0–46.0)
Hemoglobin: 12 g/dL (ref 12.0–15.0)
MCH: 29.6 pg (ref 26.0–34.0)
MCHC: 33.1 g/dL (ref 30.0–36.0)
MCV: 89.4 fL (ref 80.0–100.0)
Platelets: 215 10*3/uL (ref 150–400)
RBC: 4.05 MIL/uL (ref 3.87–5.11)
RDW: 13.4 % (ref 11.5–15.5)
WBC: 5.1 10*3/uL (ref 4.0–10.5)
nRBC: 0 % (ref 0.0–0.2)

## 2020-08-28 LAB — GLUCOSE, CAPILLARY
Glucose-Capillary: 180 mg/dL — ABNORMAL HIGH (ref 70–99)
Glucose-Capillary: 156 mg/dL — ABNORMAL HIGH (ref 70–99)

## 2020-08-28 LAB — HEPARIN LEVEL (UNFRACTIONATED): Heparin Unfractionated: 0.31 IU/mL (ref 0.30–0.70)

## 2020-08-28 MED ORDER — ISOSORBIDE MONONITRATE ER 30 MG PO TB24
15.0000 mg | ORAL_TABLET | Freq: Every day | ORAL | Status: DC
  Administered 2020-08-28: 15 mg via ORAL
  Filled 2020-08-28: qty 1

## 2020-08-28 MED ORDER — ISOSORBIDE MONONITRATE ER 30 MG PO TB24
15.0000 mg | ORAL_TABLET | Freq: Every day | ORAL | 3 refills | Status: DC
  Filled 2020-08-28: qty 15, 30d supply, fill #0

## 2020-08-28 MED ORDER — APIXABAN 5 MG PO TABS
5.0000 mg | ORAL_TABLET | Freq: Two times a day (BID) | ORAL | 3 refills | Status: DC
Start: 2020-08-28 — End: 2020-09-20
  Filled 2020-08-28: qty 60, 30d supply, fill #0

## 2020-08-28 MED ORDER — METOPROLOL TARTRATE 25 MG PO TABS
25.0000 mg | ORAL_TABLET | Freq: Two times a day (BID) | ORAL | 3 refills | Status: DC
  Filled 2020-08-28: qty 180, 90d supply, fill #0

## 2020-08-28 MED ORDER — APIXABAN 5 MG PO TABS
5.0000 mg | ORAL_TABLET | Freq: Two times a day (BID) | ORAL | Status: DC
  Administered 2020-08-28: 5 mg via ORAL
  Filled 2020-08-28: qty 1

## 2020-08-28 NOTE — TOC Benefit Eligibility Note (Signed)
Patient Teacher, English as a foreign language completed.    The patient is currently admitted and upon discharge could be taking Eliquis 5 mg.  The current 30 day co-pay is, $43.00.   The patient is currently admitted and upon discharge could be taking Xarelto 15 mg.  The current 30 day co-pay is, $43.00.   The patient is currently admitted and upon discharge could be taking Jardiance 10 mg.  The current 30 day co-pay is, $43.00.   The patient is currently admitted and upon discharge could be taking Farxiga 10 mg.  Non Formulary Drug  The patient is insured through [employer] Medicare part D   Lyndel Safe, [place] Patient Advocate Specialist [place] Team Direct Number: [phone]  Fax: [phone]

## 2020-08-28 NOTE — Progress Notes (Addendum)
Progress Note  Patient Name: [name] [name] Date of Encounter: 08/28/2020  [facility] Cardiologist: Minus Breeding, MD  Lipid clinic Dr. Debara Pickett  Subjective   After up to [facility] and brushing teeth + chest pressure lasted 30 min and resolved, later she and her husband walked down hall and she had no pain.  Was very aware of rapid HR yesterday. Has never had before  Inpatient Medications    Scheduled Meds: . aspirin  81 mg Oral Daily  . colesevelam  1,250 mg Oral Daily  . dicyclomine  10 mg Oral QHS  . famotidine  20 mg Oral QHS  . fenofibrate  160 mg Oral Daily  . insulin aspart  0-9 Units Subcutaneous TID WC  . levothyroxine  168 mcg Oral Once per day on Sun  . levothyroxine  88 mcg Oral Once per day on Mon Tue Wed Thu Fri Sat  . metoprolol tartrate  25 mg Oral BID  . pantoprazole  40 mg Oral BID   Continuous Infusions: . diltiazem (CARDIZEM) infusion Stopped (08/27/20 1816)  . heparin 1,200 Units/hr (08/28/20 0049)   PRN Meds: acetaminophen **OR** acetaminophen, nitroGLYCERIN   Vital Signs    Vitals:   08/27/20 2037 08/27/20 2318 08/27/20 2338 08/28/20 0311  BP: (!) 110/59 (!) 117/47 (!) 117/47 (!) 109/54  Pulse:  61    Resp:   16 18  Temp: 98 F (36.7 C)  98.2 F (36.8 C) 98 F (36.7 C)  TempSrc: Oral  Oral Oral  SpO2:      Weight:    81.1 kg  Height:        Intake/Output Summary (Last 24 hours) at 08/28/2020 0749 Last data filed at 08/27/2020 2329 Gross per 24 hour  Intake 838.89 ml  Output --  Net 838.89 ml   Last 3 Weights 08/28/2020 08/27/2020 08/26/2020  Weight (lbs) 178 lb 11.2 oz 187 lb 6.3 oz 181 lb 12.8 oz  Weight (kg) 81.058 kg 85 kg 82.464 kg      Telemetry    Now SB with rapid HR possible PSVT vs a fib  - Personally Reviewed  ECG    todays EKG SB at 55 with 1st degree AV block   - Personally Reviewed  Physical Exam   GEN: No acute distress.   Neck: No JVD Cardiac: RRR, no murmurs, rubs, or gallops.  Respiratory: Clear to auscultation  bilaterally. GI: Soft, nontender, non-distended  MS: No edema; No deformity. Neuro:  Nonfocal  Psych: Normal affect   Labs    High Sensitivity Troponin:   Recent Labs  Lab 08/26/20 1412 08/26/20 1623 08/26/20 2309 08/27/20 0119  TROPONINIHS <2 <2 <2 <2      Chemistry Recent Labs  Lab 08/26/20 1412 08/27/20 0119  NA 137 140  K 3.6 3.8  CL 102 107  CO2 23 26  GLUCOSE 177* 188*  BUN 19 15  CREATININE 1.24* 1.28*  CALCIUM 9.4 9.0  PROT  --  6.1*  ALBUMIN  --  3.4*  AST  --  19  ALT  --  23  ALKPHOS  --  58  BILITOT  --  0.5  GFRNONAA 48* 46*  ANIONGAP 12 7     Hematology Recent Labs  Lab 08/26/20 1412 08/27/20 0119  WBC 7.1 6.2  RBC 4.40 3.86*  HGB 12.9 11.4*  HCT 38.3 34.1*  MCV 87.0 88.3  MCH 29.3 29.5  MCHC 33.7 33.4  RDW 13.3 13.3  PLT 284 213  BNPNo results for input(s): BNP, PROBNP in the last 168 hours.   DDimer No results for input(s): DDIMER in the last 168 hours.   Radiology    DG Chest 2 View  Result Date: 08/26/2020 CLINICAL DATA:  Chest pain. EXAM: CHEST - 2 VIEW COMPARISON:  03/18/2018. FINDINGS: The heart size and mediastinal contours are within normal limits and similar to prior. Both lungs are clear. No visible pleural effusions or pneumothorax. No acute osseous abnormality. Similar appearance of the chest in comparison to prior. IMPRESSION: No active cardiopulmonary disease. Electronically Signed   By: Margaretha Sheffield MD   On: 08/26/2020 14:38   NM Myocar Multi W/Spect W/Wall Motion / EF  Result Date: 08/27/2020 CLINICAL DATA:  Chest pain EXAM: MYOCARDIAL IMAGING WITH SPECT (REST AND PHARMACOLOGIC-STRESS) GATED LEFT VENTRICULAR WALL MOTION STUDY LEFT VENTRICULAR EJECTION FRACTION TECHNIQUE: Standard myocardial SPECT imaging was performed after resting intravenous injection of 10.4 mCi Tc-72m tetrofosmin. Subsequently, intravenous infusion of Lexiscan was performed under the supervision of the Cardiology staff. At peak effect of the  drug, 32 mCi Tc-64m tetrofosmin was injected intravenously and standard myocardial SPECT imaging was performed. Quantitative gated imaging was also performed to evaluate left ventricular wall motion, and estimate left ventricular ejection fraction. COMPARISON:  None. FINDINGS: Perfusion: No decreased activity in the left ventricle on stress imaging to suggest reversible ischemia or infarction. Wall Motion: Normal left ventricular wall motion. No left ventricular dilation. Left Ventricular Ejection Fraction: 76 % End diastolic volume 59 ml End systolic volume 14 ml IMPRESSION: 1. No reversible ischemia or infarction. 2. Normal left ventricular wall motion. 3. Left ventricular ejection fraction 76% 4. Non invasive risk stratification*: Low *2012 Appropriate Use Criteria for Coronary Revascularization Focused Update: J Am Coll Cardiol. 9735;32(9):924-268. http://content.[email or web address].aspx?articleid=[email or web address] Electronically Signed   By: Dorise Bullion III M.D   On: 08/27/2020 12:48   ECHOCARDIOGRAM COMPLETE  Result Date: 08/27/2020    ECHOCARDIOGRAM REPORT   Patient Name:   [name] [name] Date of Exam: 08/27/2020 Medical Rec #:  [id number]       Height:       65.0 in Accession #:    [id number]      Weight:       187.4 lb Date of Birth:  1956/05/24        BSA:          1.924 m Patient Age:    55 years        BP:           125/55 mmHg Patient Gender: F               HR:           91 bpm. Exam Location:  Inpatient Procedure: 2D Echo, Cardiac Doppler and Color Doppler Indications:    R07.89 Other chest pain  History:        Patient has prior history of Echocardiogram examinations, most                 recent 10/07/2017.  Sonographer:    Merrie Roof RDCS Referring Phys: [id number] [facility]  Sonographer Comments: Technically difficult study due to poor echo windows. IMPRESSIONS  1. Left ventricular ejection fraction, by estimation, is 65 to 70%. The left ventricle has normal function. The left ventricle has no  regional wall motion abnormalities. There is mild left ventricular hypertrophy. Left ventricular diastolic parameters are consistent with Grade I diastolic dysfunction (impaired relaxation).  2. Right ventricular systolic function is normal.  The right ventricular size is normal.  3. The mitral valve is normal in structure. Trivial mitral valve regurgitation. No evidence of mitral stenosis.  4. The aortic valve is normal in structure. Aortic valve regurgitation is not visualized. No aortic stenosis is present. Comparison(s): A prior study was performed on 10/07/2017. No significant change from prior study. Prior images reviewed side by side. Trivial pericaridal effusion with prominent epicardial adipose layer is unchanged. FINDINGS  Left Ventricle: Left ventricular ejection fraction, by estimation, is 65 to 70%. The left ventricle has normal function. The left ventricle has no regional wall motion abnormalities. The left ventricular internal cavity size was normal in size. There is  mild left ventricular hypertrophy. Left ventricular diastolic parameters are consistent with Grade I diastolic dysfunction (impaired relaxation). Right Ventricle: The right ventricular size is normal. No increase in right ventricular wall thickness. Right ventricular systolic function is normal. Left Atrium: Left atrial size was normal in size. Right Atrium: Right atrial size was normal in size. Pericardium: Trivial pericardial effusion is present. Presence of pericardial fat pad. Mitral Valve: The mitral valve is normal in structure. Trivial mitral valve regurgitation. No evidence of mitral valve stenosis. Tricuspid Valve: The tricuspid valve is normal in structure. Tricuspid valve regurgitation is trivial. No evidence of tricuspid stenosis. Aortic Valve: The aortic valve is normal in structure. Aortic valve regurgitation is not visualized. No aortic stenosis is present. Aortic valve mean gradient measures 5.0 mmHg. Aortic valve peak  gradient measures 9.9 mmHg. Aortic valve area, by VTI measures 2.05 cm. Pulmonic Valve: The pulmonic valve was normal in structure. Pulmonic valve regurgitation is trivial. No evidence of pulmonic stenosis. Aorta: The aortic root and ascending aorta are structurally normal, with no evidence of dilitation. Venous: The inferior vena cava was not well visualized. IAS/Shunts: The interatrial septum was not well visualized.  LEFT VENTRICLE PLAX 2D LVIDd:         4.10 cm  Diastology LVIDs:         2.60 cm  LV e' medial:    7.07 cm/s LV PW:         1.10 cm  LV E/e' medial:  12.4 LV IVS:        1.00 cm  LV e' lateral:   8.38 cm/s LVOT diam:     1.90 cm  LV E/e' lateral: 10.4 LV SV:         74 LV SV Index:   38 LVOT Area:     2.84 cm  RIGHT VENTRICLE RV Basal diam:  2.70 cm LEFT ATRIUM             Index       RIGHT ATRIUM           Index LA diam:        3.40 cm 1.77 cm/m  RA Area:     14.90 cm LA Vol (A2C):   42.2 ml 21.93 ml/m RA Volume:   32.70 ml  16.99 ml/m LA Vol (A4C):   50.5 ml 26.25 ml/m LA Biplane Vol: 50.5 ml 26.25 ml/m  AORTIC VALVE AV Area (Vmax):    2.06 cm AV Area (Vmean):   2.10 cm AV Area (VTI):     2.05 cm AV Vmax:           157.00 cm/s AV Vmean:          104.000 cm/s AV VTI:            0.359 m AV Peak Grad:  9.9 mmHg AV Mean Grad:      5.0 mmHg LVOT Vmax:         114.00 cm/s LVOT Vmean:        77.100 cm/s LVOT VTI:          0.260 m LVOT/AV VTI ratio: 0.72  AORTA Ao Root diam: 3.50 cm MITRAL VALVE MV Area (PHT): 6.27 cm    SHUNTS MV Decel Time: 121 msec    Systemic VTI:  0.26 m MV E velocity: 87.40 cm/s  Systemic Diam: 1.90 cm Cherlynn Kaiser MD Electronically signed by Cherlynn Kaiser MD Signature Date/Time: 08/27/2020/2:59:48 PM    Final     Cardiac Studies   Cardiac Catheterization: 10/2017 Conclusions: 1. Severe single-vessel coronary artery disease with 60% ostial/proximal LAD stenosis (FFR 0.73, IVUS MLA 4.5 mm^2). 2. Mild to moderate, non-obstructive coronary artery disease  involving ramus, LCx, and RCA. 3. Upper normal left ventricular filling pressure. 4. Successful FFR and IVUS-guided PCI to ostial/proximal LAD using overlapping Xience Sierra 3.0 x 18 mm and 2.75 x 8 mm drug-eluting stents with 0% residual stenosis and TIMI-3 flow. 5. Occlusion of small (<2 mm) D1 branch after being jailed by proximal LAD stent. Chest pain improved with medical therapy; intervention was not attempted due to small vessel size.  Recommendations: 1. Dual antiplatelet therapy with aspirin and ticagrelor for at least 6 months, ideally indefinitely. 2. Aggressive secondary prevention. Consider retrial of statin or initiation of PCSK9 inhibitor. 3. Overnight extended recovery; likely discharge in the morning.   GXT: 11/2019  Blood pressure demonstrated a normal response to exercise.  There was no ST segment deviation noted during stress.  No T wave inversion was noted during stress.  Moderately impaired exercise tolerance, but otherwise normal ECG stress test.   Echo 08/27/20 IMPRESSIONS    1. Left ventricular ejection fraction, by estimation, is 65 to 70%. The  left ventricle has normal function. The left ventricle has no regional  wall motion abnormalities. There is mild left ventricular hypertrophy.  Left ventricular diastolic parameters  are consistent with Grade I diastolic dysfunction (impaired relaxation).  2. Right ventricular systolic function is normal. The right ventricular  size is normal.  3. The mitral valve is normal in structure. Trivial mitral valve  regurgitation. No evidence of mitral stenosis.  4. The aortic valve is normal in structure. Aortic valve regurgitation is  not visualized. No aortic stenosis is present.   Comparison(s): A prior study was performed on 10/07/2017. No significant  change from prior study. Prior images reviewed side by side. Trivial  pericaridal effusion with prominent epicardial adipose layer is unchanged.    FINDINGS  Left Ventricle: Left ventricular ejection fraction, by estimation, is 65  to 70%. The left ventricle has normal function. The left ventricle has no  regional wall motion abnormalities. The left ventricular internal cavity  size was normal in size. There is  mild left ventricular hypertrophy. Left ventricular diastolic parameters  are consistent with Grade I diastolic dysfunction (impaired relaxation).   Right Ventricle: The right ventricular size is normal. No increase in  right ventricular wall thickness. Right ventricular systolic function is  normal.   Left Atrium: Left atrial size was normal in size.   Right Atrium: Right atrial size was normal in size.   Pericardium: Trivial pericardial effusion is present. Presence of  pericardial fat pad.   Mitral Valve: The mitral valve is normal in structure. Trivial mitral  valve regurgitation. No evidence of mitral valve stenosis.  Tricuspid Valve: The tricuspid valve is normal in structure. Tricuspid  valve regurgitation is trivial. No evidence of tricuspid stenosis.   Aortic Valve: The aortic valve is normal in structure. Aortic valve  regurgitation is not visualized. No aortic stenosis is present. Aortic  valve mean gradient measures 5.0 mmHg. Aortic valve peak gradient measures  9.9 mmHg. Aortic valve area, by VTI  measures 2.05 cm.   Pulmonic Valve: The pulmonic valve was normal in structure. Pulmonic valve  regurgitation is trivial. No evidence of pulmonic stenosis.   Aorta: The aortic root and ascending aorta are structurally normal, with  no evidence of dilitation.   Venous: The inferior vena cava was not well visualized.   IAS/Shunts: The interatrial septum was not well visualized.     LEFT VENTRICLE  PLAX 2D  LVIDd:     4.10 cm Diastology  LVIDs:     2.60 cm LV e' medial:  7.07 cm/s  LV PW:     1.10 cm LV E/e' medial: 12.4  LV IVS:    1.00 cm LV e' lateral:  8.38 cm/s   LVOT diam:   1.90 cm LV E/e' lateral: 10.4  LV SV:     74  LV SV Index:  38  LVOT Area:   2.84 cm    NUC study 08/27/20  IMPRESSION: 1. No reversible ischemia or infarction.  2. Normal left ventricular wall motion.  3. Left ventricular ejection fraction 76%  4. Non invasive risk stratification*: Low  Patient Profile     65 y.o. female with past medical history of CAD (s/p DES to ostial/proximal LAD in 10/2017 with occlusion of small D1 branch, low-risk GXT in 11/2019 but did not achieve target HR), HTN, HLD, Hypothyroidism and Type 2 DM now admitted with chest pain and with PAF after admit.    Assessment & Plan    Chest pain rt side, neg troponin.  No acute ST changes on EKG.  Echo stable.  Neg nuc study. -with up to BR and brushing her teeth + chest pain lasted 30 min.   With walking in hall no pain Dr. Johney Frame to see, with ongoing chest pain in SR ? Further studies vs meds   CAD with hx DES to ostial/proximal LAD in 10/2017 with occlusion of small D1 branch and most recent ischemic evaluation was a GXT in 11/2019 but she did not achieve Target HR.  -Continue ASA, Fenofibrate and Lopressor. She is intolerant to statin therapy but is on Repatha as an outpatient -was to be discharged yesterday   PAF yesterday, placed on IV heparin and dilt drip, lasted about 2 hours -CHA2DS2VASc of 5 currently on heparin. -TSH 19.142 with Free T4 of 0.80   -may need outpt monitor to eval burden of atrial fib.  -symptomatic with jaw tingling chest pressure  CKD 3a Cr 1.28   HLD on repatha and intolerance to statin, on fenofibrate as well LDL 121, TG 253  HTN controlled on cilt 240 daily and lopressor 25 BID  DM-2 per IM, last A1C 7.8 and followed by endocrinology  Hypothyroidism with TSH 19.142  Per IM and follows with endocrinology    Research pt  [place], CV week 16 on 08/02/20      For questions or updates, please contact [facility] Please consult  [email or web address] for contact info under        Signed, Cecilie Kicks, NP  08/28/2020, 7:49 AM    Patient seen and examined and agree with Cecilie Kicks, NP  as detailed above.  In brief, the patient is a 65 year old with past medical history of CAD (s/p DES to ostial/proximal LAD in 10/2017 with occlusion of small D1 branch, low-risk GXT in 07/2021but did not achieve target HR), HTN, HLD, Hypothyroidism andType 2 DMwho presented with chest pain with course complicated by paroxysmal atrial fibrillation. Nuc negative for ischemia or infarction. TTE with LVEF 65-70%, G1DD, no WMA, no significant valve disease. Has been having intermittent right sided chest pressure during admission. Pain was not exertional in nature and occurred after brushing her teeth. Was able to ambulate the hallway without symptoms. Discussed option of medical management vs cath for further work-up and patient opted to do trial of medications given reassuring TTE and nuc during admission.   Also with newly diagnosed paroxysmal atrial fibrillation. Will plan on apixaban at discharge for [facility] and metop for rate control. Will place 2 week zio for monitoring of burden of Afib. Discussed that if she develops Afib with RVR at home, she can take an additional dose of her metop as needed as long as blood pressure is >211 systolic and she is stable (no significant chest pressure, SOB, lightheadedness etc.). Will plan for close Cardiology follow-up as out-patient.   Exam: GEN: No acute distress.   Neck: No JVD Cardiac: RRR, no murmurs, rubs, or gallops.  Respiratory: Clear to auscultation bilaterally. GI: Soft, nontender, non-distended  MS: No edema; No deformity. Neuro:  Nonfocal  Psych: Normal affect   Plan: -Okay to discharge home today -Stop heparin gtt and start apixaban for [facility] -Will continue ASA given known CAD -Start imdur 15mg  daily -Continue metop 25mg  PO BID with extra dose as needed for breakthrough Afib -Continue repatha  for HLD -Plan for 2 week zio to assess burden of Afib -If fails medical management, can proceed with cath at that time -Will arrange for close Cardiology follow-up  Gwyndolyn Kaufman, MD

## 2020-08-28 NOTE — Progress Notes (Signed)
I ordered zio patch to be placed prior to discharge.

## 2020-08-28 NOTE — Progress Notes (Signed)
Zio patch placed onto patient.  All instructions and information reviewed with patient, they verbalize understanding with no questions. 

## 2020-08-28 NOTE — Progress Notes (Signed)
[place] for heparin Indication: afib   Labs: Recent Labs    08/26/20 1412 08/26/20 1623 08/26/20 2309 08/27/20 0119 08/27/20 2312  HGB 12.9  --   --  11.4*  --   HCT 38.3  --   --  34.1*  --   PLT 284  --   --  213  --   HEPARINUNFRC  --   --   --   --  0.21*  CREATININE 1.24*  --   --  1.28*  --   TROPONINIHS <2 <2 <2 <2  --     Assessment: 65 year old female with known CAD presenting with chest pain and recently ruled out for acs with negative CEs and stress test. Patient now with new onset afib with rvr, orders to start heparin for anticoagulation. She received 40mg  of lovenox last night. CBC within normal limits. Initial heparin level 0.21 units/ml.  No issues noted with infusion  Goal of Therapy:  Heparin level 0.3-0.7 units/ml Monitor platelets by anticoagulation protocol: Yes   Plan:  Increase heparin to 1200 units/hr Check heparin level in 6-8 hours  Excell Seltzer, PharmD Clinical Pharmacist 08/28/2020 12:00 AM

## 2020-08-28 NOTE — Progress Notes (Addendum)
[place] for heparin Indication: afib  Allergies  Allergen Reactions  . Epinephrine Other (See Comments)    Pass out   . Statins Other (See Comments)    Myalgias: Simvastatin also caused back ache  . Welchol [Colesevelam Hcl]     myalgia  . Amoxicillin Itching and Rash    Has patient had a PCN reaction causing immediate rash, facial/tongue/throat swelling, SOB or lightheadedness with hypotension: No Has patient had a PCN reaction causing severe rash involving mucus membranes or skin necrosis: No Has patient had a PCN reaction that required hospitalization: No Has patient had a PCN reaction occurring within the last 10 years: Yes If all of the above answers are "NO", then may proceed with Cephalosporin use.     Patient Measurements: Height: 5\' 5"  (165.1 cm) Weight: 81.1 kg (178 lb 11.2 oz) IBW/kg (Calculated) : 57 Heparin Dosing Weight: 75kg  Vital Signs: Temp: 98 F (36.7 C) (04/04 0311) Temp Source: Oral (04/04 0311) BP: 130/58 (04/04 0715) Pulse Rate: 61 (04/03 2318)  Labs: Recent Labs    08/26/20 1412 08/26/20 1623 08/26/20 2309 08/27/20 0119 08/27/20 2312 08/28/20 0747  HGB 12.9  --   --  11.4*  --  12.0  HCT 38.3  --   --  34.1*  --  36.2  PLT 284  --   --  213  --  215  HEPARINUNFRC  --   --   --   --  0.21* 0.31  CREATININE 1.24*  --   --  1.28*  --   --   TROPONINIHS <2 <2 <2 <2  --   --     Estimated Creatinine Clearance: 46.1 mL/min (A) (by C-G formula based on SCr of 1.28 mg/dL (H)).   Medical History: Past Medical History:  Diagnosis Date  . Accelerating angina ([place]) 11/04/2017  . Asthma   . Chronic pain disorder    neck and shoulder  . CTS (carpal tunnel syndrome) 01/27/2013   right  . Diabetes mellitus type II   . Diabetic gastroparesis ([place])   . Esophageal reflux 08/25/2013   [place], Dr Barth Kirks  . FATTY LIVER DISEASE 04/27/2007   Qualifier: Diagnosis of  By: Danelle Earthly CMA, Darlene    .  Focal muscle atrophy 09/26/2015  . Fundic gland polyps of stomach, benign   . HTN (hypertension)   . Hyperlipidemia, mixed 05/04/2007   Qualifier: Diagnosis of  By: Wynona Luna crestor caused myalgias even at low dose Livalo caused myalgias Lipitor  Mother with severe reaction myalgias Simvastatin, Welchol   . Hypertension 08/24/2010  . Hypokalemia 05/25/2013   Improved stopping HCTZ. Was noted to have an elevated glucose when K was low. Recheck renal next week after starting Maxzide  . Hypomagnesemia 08/10/2014  . Hypothyroidism   . Iron deficiency anemia 08/07/2018  . Iron malabsorption 08/07/2018  . Laryngitis 08/25/2017  . Leg cramps 04/15/2016  . Nausea without vomiting 08/08/2014  . NONSPEC ELEVATION OF LEVELS OF TRANSAMINASE/LDH 05/01/2007   Qualifier: Diagnosis of  By: Garner Gavel    . Osteoarthritis    chronic, right knee (11/04/2017)  . OVARIAN CYST 07/11/2009   Qualifier: Diagnosis of  By: Wynona Luna   . Ovarian cyst   . Plantar fasciitis   . Rosacea 10/22/2016  . Urine incontinence     Assessment: 65 year old female with known CAD presenting with chest pain and recently ruled out for acs with negative  CEs and stress test. Patient now with new onset afib with rvr on heparin  -heparin level at goal  -hg= 12  Goal of Therapy:  Heparin level 0.3-0.7 units/ml Monitor platelets by anticoagulation protocol: Yes   Plan:  -Continue heparin at 1200 units/hr -Daily heparin level and CBC -Will check on cost of apixaban  Hildred Laser, PharmD Clinical Pharmacist **Pharmacist phone directory can now be found on [email or web address] (PW [email or web address]).  Listed under [place].   Addendum -to transition to apixban -SCr= 1.2, wt= 81kg  Plan -apixaban 5mg  po bid  Hildred Laser, PharmD Clinical Pharmacist **Pharmacist phone directory can now be found on [email or web address] (PW [email or web address]).  Listed under [place].

## 2020-08-28 NOTE — Discharge Instructions (Signed)
Call Dr. Rosezella [place] office if any problems or questions.   Your monitor should be on when you leave the hospital.

## 2020-08-28 NOTE — Discharge Summary (Signed)
Physician Discharge Summary   Patient ID: [name] [name] MRN: [id number] DOB/AGE: [birth year] 65 y.o.  Admit date: 08/26/2020 Discharge date: 08/28/2020  Primary Care Physician:  Ma Hillock, DO   Recommendations for Outpatient Follow-up:  1. Follow up with PCP in 1-2 weeks 2. Continue metoprolol 25 mg twice daily for rate control and extra dose for breakthrough A. Fib 3. Patient started on Eliquis.  Cardiology recommended continue aspirin due to history of CAD  Home Health: None  Equipment/Devices:   Discharge Condition: stable  CODE STATUS: FULL Diet recommendation: Heart healthy diet   Discharge Diagnoses:    . Right-sided atypical chest pain possibly due to atrial fibrillation with RVR . History of CAD, DES . Hypothyroidism . Hyperlipidemia LDL goal <70 . Hypertension CKD stage IIIa Diabetes mellitus type 2, NIDDM  Consults: Cardiology   Allergies:   Allergies  Allergen Reactions  . Epinephrine Other (See Comments)    Pass out   . Statins Other (See Comments)    Myalgias: Simvastatin also caused back ache  . Welchol [Colesevelam Hcl]     myalgia  . Amoxicillin Itching and Rash    Has patient had a PCN reaction causing immediate rash, facial/tongue/throat swelling, SOB or lightheadedness with hypotension: No Has patient had a PCN reaction causing severe rash involving mucus membranes or skin necrosis: No Has patient had a PCN reaction that required hospitalization: No Has patient had a PCN reaction occurring within the last 10 years: Yes If all of the above answers are "NO", then may proceed with Cephalosporin use.      DISCHARGE MEDICATIONS: Allergies as of 08/28/2020      Reactions   Epinephrine Other (See Comments)   Pass out    Statins Other (See Comments)   Myalgias: Simvastatin also caused back ache   Welchol [colesevelam Hcl]    myalgia   Amoxicillin Itching, Rash   Has patient had a PCN reaction causing immediate rash, facial/tongue/throat  swelling, SOB or lightheadedness with hypotension: No Has patient had a PCN reaction causing severe rash involving mucus membranes or skin necrosis: No Has patient had a PCN reaction that required hospitalization: No Has patient had a PCN reaction occurring within the last 10 years: Yes If all of the above answers are "NO", then may proceed with Cephalosporin use.      Medication List    STOP taking these medications   dicyclomine 10 MG capsule Commonly known as: Bentyl   diltiazem 240 MG 24 hr capsule Commonly known as: CARDIZEM CD   Glucosamine-Chondroitin 250-200 MG Tabs   Krill Oil 350 MG Caps     TAKE these medications   acarbose 25 MG tablet Commonly known as: PRECOSE Take 1 tablet (25 mg total) by mouth 3 (three) times daily with meals.   Accu-Chek Guide test strip Generic drug: glucose blood Use 4 times daily as needed or directed.   Accu-Chek Softclix Lancets lancets Use 4 times daily as needed or directed   apixaban 5 MG Tabs tablet Commonly known as: ELIQUIS Take 1 tablet (5 mg total) by mouth 2 (two) times daily.   aspirin 81 MG tablet Take 81 mg by mouth daily.   CALTRATE 600 PO Take 600 mg by mouth 2 (two) times daily.   colesevelam 625 MG tablet Commonly known as: Welchol Take 2 tablets (1,250 mg total) by mouth daily.   famotidine 20 MG tablet Commonly known as: PEPCID Take 1 tablet (20 mg total) by mouth at bedtime.  fenofibrate 160 MG tablet Take 1 tablet (160 mg total) by mouth daily.   isosorbide mononitrate 30 MG 24 hr tablet Commonly known as: IMDUR Take 0.5 tablets (15 mg total) by mouth daily.   Jardiance 25 MG Tabs tablet Generic drug: empagliflozin Take 25 mg by mouth daily.   levothyroxine 88 MCG tablet Commonly known as: SYNTHROID Take one tablet daily on an empty stomach, 2 tabs on Saturday.   Magnesium 250 MG Tabs Take 1 tablet by mouth daily.   metFORMIN 500 MG tablet Commonly known as: GLUCOPHAGE Take 500 mg by  mouth daily.   metoprolol tartrate 25 MG tablet Commonly known as: LOPRESSOR Take 1 tablet (25 mg total) by mouth 2 (two) times daily. Take extra dose for break though Afib with increased HR >100 What changed: additional instructions   nitroGLYCERIN 0.4 MG SL tablet Commonly known as: NITROSTAT Place 1 tablet (0.4 mg total) under the tongue every 5 (five) minutes as needed for chest pain.   pantoprazole 40 MG tablet Commonly known as: PROTONIX Take 1 tablet (40 mg total) by mouth 2 (two) times daily.   pioglitazone 30 MG tablet Commonly known as: ACTOS Take 1 tablet (30 mg total) by mouth daily.   POTASSIUM CITRATE PO Take 99 mg by mouth daily.   PROBIOTIC DAILY PO Take 99 mg by mouth daily.   repaglinide 2 MG tablet Commonly known as: PRANDIN Take 2 mg by mouth in the morning and at bedtime.   Repatha SureClick 818 MG/ML Soaj Generic drug: Evolocumab Inject 1 Dose into the skin every 14 (fourteen) days.        Brief H and P: For complete details please refer to admission H and P, but in brief patient is a 65 year old female with history of CAD status post Dess, hypertension, hyperlipidemia, hypothyroidism, diabetes mellitus type 2 presented with chest pain.  Patient reported chest pain while working in the yard, was picking up broken limbs of the tree when started having substernal chest pressure, nonradiating, squeezing in type.  It became more intense and stopped moving across the chest.  No relief with sublingual nitroglycerin. Troponins negative, patient was admitted for further work-up.  Hospital Course:     Chest pain, atypical -Currently resolved, troponins negative, 2D echocardiogram showed EF 65 to 29% grade 1 diastolic dysfunction.  No acute ST-T wave changes on EKG. -Nuclear medicine stress test showed no reversible ischemia or infarction, normal LVEF, EF 76% -Cardiology was consulted -This morning, brushing her teeth and walking up to the bathroom, reported  chest pain, now resolved.  Cardiology recommended starting Imdur 15 mg daily and beta-blocker 25 mg twice a day for rate control -Outpatient follow-up with cardiology, if fails medical management, can proceed with cardiac cath at that time -Continue Lipitor for hyperlipidemia outpatient  Paroxysmal atrial fibrillation with RVR - on 4/3, patient was noted to be in atrial fibrillation with RVR.  She was briefly placed on Cardizem drip and IV heparin -Currently rate controlled, placed on Eliquis.  Cardiology recommended continue aspirin 81 mg daily due to history of CAD -Continue beta-blocker 25 mg p.o. twice daily for rate control and extra dose as needed for breakthrough A. Fib -Plan for 2 weeks zio to assess burden of A. fib    Hypothyroidism -TSH 19.14, free T4 0.8 -Continue Synthroid, outpatient follow-up with PCP    Hyperlipidemia LDL goal <70 -Continue Repatha -Cholesterol 210, LDL 121, triglycerides 253    Hypertension BP currently controlled, continue Imdur, beta-blocker  Diabetes  mellitus, type II, NIDDM with CKD -Continue Actos, Jardiance -Hemoglobin A1c 7.8 in 04/2020  CKD stage IIIa Baseline creatinine 1.2, currently at baseline  Day of Discharge S: No acute complaints, overnight had transient chest pain while ambulating to the bathroom and brushing her teeth.  Hoping to go home today.  Husband at bedside  BP (!) 130/58   Pulse 61   Temp 98 F (36.7 C) (Oral)   Resp 18   Ht 5\' 5"  (1.651 m)   Wt 81.1 kg   SpO2 98%   BMI 29.74 kg/m   Physical Exam: General: Alert and awake oriented x3 not in any acute distress. CVS: S1-S2 clear no murmur rubs or gallops Chest: clear to auscultation bilaterally, no wheezing rales or rhonchi Abdomen: soft nontender, nondistended, normal bowel sounds Extremities: no cyanosis, clubbing or edema noted bilaterally Neuro: Cranial nerves II-XII intact, no focal neurological deficits    Get Medicines reviewed and adjusted: Please  take all your medications with you for your next visit with your Primary MD  Please request your Primary MD to go over all hospital tests and procedure/radiological results at the follow up. Please ask your Primary MD to get all Hospital records sent to his/her office.  If you experience worsening of your admission symptoms, develop shortness of breath, life threatening emergency, suicidal or homicidal thoughts you must seek medical attention immediately by calling 911 or calling your MD immediately  if symptoms less severe.  You must read complete instructions/literature along with all the possible adverse reactions/side effects for all the Medicines you take and that have been prescribed to you. Take any new Medicines after you have completely understood and accept all the possible adverse reactions/side effects.   Do not drive when taking pain medications.   Do not take more than prescribed Pain, Sleep and Anxiety Medications  Special Instructions: If you have smoked or chewed Tobacco  in the last 2 yrs please stop smoking, stop any regular Alcohol  and or any Recreational drug use.  Wear Seat belts while driving.  Please note  You were cared for by a hospitalist during your hospital stay. Once you are discharged, your primary care physician will handle any further medical issues. Please note that NO REFILLS for any discharge medications will be authorized once you are discharged, as it is imperative that you return to your primary care physician (or establish a relationship with a primary care physician if you do not have one) for your aftercare needs so that they can reassess your need for medications and monitor your lab values.   The results of significant diagnostics from this hospitalization (including imaging, microbiology, ancillary and laboratory) are listed below for reference.      Procedures/Studies:  DG Chest 2 View  Result Date: 08/26/2020 CLINICAL DATA:  Chest pain.  EXAM: CHEST - 2 VIEW COMPARISON:  03/18/2018. FINDINGS: The heart size and mediastinal contours are within normal limits and similar to prior. Both lungs are clear. No visible pleural effusions or pneumothorax. No acute osseous abnormality. Similar appearance of the chest in comparison to prior. IMPRESSION: No active cardiopulmonary disease. Electronically Signed   By: Margaretha Sheffield MD   On: 08/26/2020 14:38   NM Myocar Multi W/Spect Tamela Oddi Motion / EF  Result Date: 08/27/2020 CLINICAL DATA:  Chest pain EXAM: MYOCARDIAL IMAGING WITH SPECT (REST AND PHARMACOLOGIC-STRESS) GATED LEFT VENTRICULAR WALL MOTION STUDY LEFT VENTRICULAR EJECTION FRACTION TECHNIQUE: Standard myocardial SPECT imaging was performed after resting intravenous injection of 10.4 mCi  Tc-62m tetrofosmin. Subsequently, intravenous infusion of Lexiscan was performed under the supervision of the Cardiology staff. At peak effect of the drug, 32 mCi Tc-53m tetrofosmin was injected intravenously and standard myocardial SPECT imaging was performed. Quantitative gated imaging was also performed to evaluate left ventricular wall motion, and estimate left ventricular ejection fraction. COMPARISON:  None. FINDINGS: Perfusion: No decreased activity in the left ventricle on stress imaging to suggest reversible ischemia or infarction. Wall Motion: Normal left ventricular wall motion. No left ventricular dilation. Left Ventricular Ejection Fraction: 76 % End diastolic volume 59 ml End systolic volume 14 ml IMPRESSION: 1. No reversible ischemia or infarction. 2. Normal left ventricular wall motion. 3. Left ventricular ejection fraction 76% 4. Non invasive risk stratification*: Low *2012 Appropriate Use Criteria for Coronary Revascularization Focused Update: J Am Coll Cardiol. 0109;32(3):557-322. http://content.airportbarriers.com.aspx?articleid=[email or web address] Electronically Signed   By: Dorise Bullion III M.D   On: 08/27/2020 12:48   ECHOCARDIOGRAM  COMPLETE  Result Date: 08/27/2020    ECHOCARDIOGRAM REPORT   Patient Name:   [name] [name] Date of Exam: 08/27/2020 Medical Rec #:  [id number]       Height:       65.0 in Accession #:    [id number]      Weight:       187.4 lb Date of Birth:  01-05-1956        BSA:          1.924 m Patient Age:    53 years        BP:           125/55 mmHg Patient Gender: F               HR:           91 bpm. Exam Location:  Inpatient Procedure: 2D Echo, Cardiac Doppler and Color Doppler Indications:    R07.89 Other chest pain  History:        Patient has prior history of Echocardiogram examinations, most                 recent 10/07/2017.  Sonographer:    Merrie Roof RDCS Referring Phys: [id number] [facility]  Sonographer Comments: Technically difficult study due to poor echo windows. IMPRESSIONS  1. Left ventricular ejection fraction, by estimation, is 65 to 70%. The left ventricle has normal function. The left ventricle has no regional wall motion abnormalities. There is mild left ventricular hypertrophy. Left ventricular diastolic parameters are consistent with Grade I diastolic dysfunction (impaired relaxation).  2. Right ventricular systolic function is normal. The right ventricular size is normal.  3. The mitral valve is normal in structure. Trivial mitral valve regurgitation. No evidence of mitral stenosis.  4. The aortic valve is normal in structure. Aortic valve regurgitation is not visualized. No aortic stenosis is present. Comparison(s): A prior study was performed on 10/07/2017. No significant change from prior study. Prior images reviewed side by side. Trivial pericaridal effusion with prominent epicardial adipose layer is unchanged. FINDINGS  Left Ventricle: Left ventricular ejection fraction, by estimation, is 65 to 70%. The left ventricle has normal function. The left ventricle has no regional wall motion abnormalities. The left ventricular internal cavity size was normal in size. There is  mild left ventricular  hypertrophy. Left ventricular diastolic parameters are consistent with Grade I diastolic dysfunction (impaired relaxation). Right Ventricle: The right ventricular size is normal. No increase in right ventricular wall thickness. Right ventricular systolic function is normal. Left Atrium: Left atrial size  was normal in size. Right Atrium: Right atrial size was normal in size. Pericardium: Trivial pericardial effusion is present. Presence of pericardial fat pad. Mitral Valve: The mitral valve is normal in structure. Trivial mitral valve regurgitation. No evidence of mitral valve stenosis. Tricuspid Valve: The tricuspid valve is normal in structure. Tricuspid valve regurgitation is trivial. No evidence of tricuspid stenosis. Aortic Valve: The aortic valve is normal in structure. Aortic valve regurgitation is not visualized. No aortic stenosis is present. Aortic valve mean gradient measures 5.0 mmHg. Aortic valve peak gradient measures 9.9 mmHg. Aortic valve area, by VTI measures 2.05 cm. Pulmonic Valve: The pulmonic valve was normal in structure. Pulmonic valve regurgitation is trivial. No evidence of pulmonic stenosis. Aorta: The aortic root and ascending aorta are structurally normal, with no evidence of dilitation. Venous: The inferior vena cava was not well visualized. IAS/Shunts: The interatrial septum was not well visualized.  LEFT VENTRICLE PLAX 2D LVIDd:         4.10 cm  Diastology LVIDs:         2.60 cm  LV e' medial:    7.07 cm/s LV PW:         1.10 cm  LV E/e' medial:  12.4 LV IVS:        1.00 cm  LV e' lateral:   8.38 cm/s LVOT diam:     1.90 cm  LV E/e' lateral: 10.4 LV SV:         74 LV SV Index:   38 LVOT Area:     2.84 cm  RIGHT VENTRICLE RV Basal diam:  2.70 cm LEFT ATRIUM             Index       RIGHT ATRIUM           Index LA diam:        3.40 cm 1.77 cm/m  RA Area:     14.90 cm LA Vol (A2C):   42.2 ml 21.93 ml/m RA Volume:   32.70 ml  16.99 ml/m LA Vol (A4C):   50.5 ml 26.25 ml/m LA Biplane  Vol: 50.5 ml 26.25 ml/m  AORTIC VALVE AV Area (Vmax):    2.06 cm AV Area (Vmean):   2.10 cm AV Area (VTI):     2.05 cm AV Vmax:           157.00 cm/s AV Vmean:          104.000 cm/s AV VTI:            0.359 m AV Peak Grad:      9.9 mmHg AV Mean Grad:      5.0 mmHg LVOT Vmax:         114.00 cm/s LVOT Vmean:        77.100 cm/s LVOT VTI:          0.260 m LVOT/AV VTI ratio: 0.72  AORTA Ao Root diam: 3.50 cm MITRAL VALVE MV Area (PHT): 6.27 cm    SHUNTS MV Decel Time: 121 msec    Systemic VTI:  0.26 m MV E velocity: 87.40 cm/s  Systemic Diam: 1.90 cm Cherlynn Kaiser MD Electronically signed by Cherlynn Kaiser MD Signature Date/Time: 08/27/2020/2:59:48 PM    Final        LAB RESULTS: Basic Metabolic Panel: Recent Labs  Lab 08/26/20 1412 08/27/20 0119  NA 137 140  K 3.6 3.8  CL 102 107  CO2 23 26  GLUCOSE 177* 188*  BUN 19 15  CREATININE 1.24* 1.28*  CALCIUM 9.4 9.0   Liver Function Tests: Recent Labs  Lab 08/27/20 0119  AST 19  ALT 23  ALKPHOS 58  BILITOT 0.5  PROT 6.1*  ALBUMIN 3.4*   No results for input(s): LIPASE, AMYLASE in the last 168 hours. No results for input(s): AMMONIA in the last 168 hours. CBC: Recent Labs  Lab 08/27/20 0119 08/28/20 0747  WBC 6.2 5.1  HGB 11.4* 12.0  HCT 34.1* 36.2  MCV 88.3 89.4  PLT 213 215   Cardiac Enzymes: No results for input(s): CKTOTAL, CKMB, CKMBINDEX, TROPONINI in the last 168 hours. BNP: Invalid input(s): POCBNP CBG: Recent Labs  Lab 08/28/20 0818 08/28/20 1116  GLUCAP 180* 156*       Disposition and Follow-up: Discharge Instructions    Diet - low sodium heart healthy   Complete by: As directed    Diet - low sodium heart healthy   Complete by: As directed    Increase activity slowly   Complete by: As directed    Increase activity slowly   Complete by: As directed        DISPOSITION: Home   DISCHARGE FOLLOW-UP  Follow-up Information    Minus Breeding, MD Follow up on 09/29/2020.   Specialty:  Cardiology Why: at 8:45 AM with his nurse practitioner Coletta Memos.   Contact information: [address] [place] [address] [phone]        Howard Pouch A, DO Follow up in 2 week(s).   Specialty: Family Medicine Contact information: [address] [place] [address] [phone]                Time coordinating discharge:  35 minutes  Signed:   Estill Cotta M.D. [facility] 08/28/2020, 12:40 PM

## 2020-08-29 ENCOUNTER — Telehealth: Payer: Self-pay

## 2020-08-29 NOTE — Telephone Encounter (Signed)
Transition Care Management Follow-up Telephone Call  Date of discharge and from where: 08/28/20-[place]  How have you been since you were released from the hospital? Pretty good.Wearing heart monitor as instructed  Any questions or concerns? No  Items Reviewed:  Did the pt receive and understand the discharge instructions provided? Yes   Medications obtained and verified? Yes   Other? Yes   Any new allergies since your discharge? No   Dietary orders reviewed? Yes  Do you have support at home? Yes   Home Care and Equipment/Supplies: Were home health services ordered? no If so, what is the name of the agency? n/a  Has the agency set up a time to come to the patient's home? not applicable Were any new equipment or medical supplies ordered?  No What is the name of the medical supply agency? n/a Were you able to get the supplies/equipment? not applicable Do you have any questions related to the use of the equipment or supplies? n/a  Functional Questionnaire: (I = Independent and D = Dependent) ADLs: I  Bathing/Dressing- I  Meal Prep- I  Eating- I  Maintaining continence- I  Transferring/Ambulation- I  Managing Meds- I  Follow up appointments reviewed:   PCP Hospital f/u appt confirmed? Yes  Scheduled to see Dr. Raoul Pitch on 09/05/20 @ 11:30.  [facility] f/u appt confirmed? Yes  Scheduled to see Cardiology on 09/29/20 @ 8:45.  Are transportation arrangements needed? No   If their condition worsens, is the pt aware to call PCP or go to the Emergency Dept.? Yes  Was the patient provided with contact information for the PCP's office or ED? Yes  Was to pt encouraged to call back with questions or concerns? Yes

## 2020-09-01 DIAGNOSIS — E1165 Type 2 diabetes mellitus with hyperglycemia: Secondary | ICD-10-CM | POA: Insufficient documentation

## 2020-09-01 DIAGNOSIS — M791 Myalgia, unspecified site: Secondary | ICD-10-CM | POA: Insufficient documentation

## 2020-09-01 DIAGNOSIS — I739 Peripheral vascular disease, unspecified: Secondary | ICD-10-CM | POA: Insufficient documentation

## 2020-09-01 DIAGNOSIS — I2510 Atherosclerotic heart disease of native coronary artery without angina pectoris: Secondary | ICD-10-CM | POA: Insufficient documentation

## 2020-09-01 DIAGNOSIS — Z789 Other specified health status: Secondary | ICD-10-CM | POA: Insufficient documentation

## 2020-09-01 DIAGNOSIS — G720 Drug-induced myopathy: Secondary | ICD-10-CM | POA: Insufficient documentation

## 2020-09-01 DIAGNOSIS — E7800 Pure hypercholesterolemia, unspecified: Secondary | ICD-10-CM | POA: Insufficient documentation

## 2020-09-05 ENCOUNTER — Inpatient Hospital Stay: Payer: Medicare Other | Admitting: Family Medicine

## 2020-09-06 ENCOUNTER — Encounter: Payer: Self-pay | Admitting: Family Medicine

## 2020-09-06 ENCOUNTER — Other Ambulatory Visit: Payer: Self-pay

## 2020-09-06 ENCOUNTER — Ambulatory Visit (INDEPENDENT_AMBULATORY_CARE_PROVIDER_SITE_OTHER): Payer: Medicare Other | Admitting: Family Medicine

## 2020-09-06 VITALS — BP 100/59 | HR 66 | Temp 98.0°F | Ht 65.0 in | Wt 181.0 lb

## 2020-09-06 DIAGNOSIS — Z7901 Long term (current) use of anticoagulants: Secondary | ICD-10-CM

## 2020-09-06 DIAGNOSIS — I2510 Atherosclerotic heart disease of native coronary artery without angina pectoris: Secondary | ICD-10-CM | POA: Diagnosis not present

## 2020-09-06 DIAGNOSIS — E039 Hypothyroidism, unspecified: Secondary | ICD-10-CM | POA: Diagnosis not present

## 2020-09-06 DIAGNOSIS — R0789 Other chest pain: Secondary | ICD-10-CM

## 2020-09-06 DIAGNOSIS — Z9861 Coronary angioplasty status: Secondary | ICD-10-CM

## 2020-09-06 DIAGNOSIS — I200 Unstable angina: Secondary | ICD-10-CM | POA: Diagnosis not present

## 2020-09-06 DIAGNOSIS — I4891 Unspecified atrial fibrillation: Secondary | ICD-10-CM

## 2020-09-06 DIAGNOSIS — Z9289 Personal history of other medical treatment: Secondary | ICD-10-CM

## 2020-09-06 LAB — COMPREHENSIVE METABOLIC PANEL
ALT: 22 U/L (ref 0–35)
AST: 18 U/L (ref 0–37)
Albumin: 4.1 g/dL (ref 3.5–5.2)
Alkaline Phosphatase: 62 U/L (ref 39–117)
BUN: 12 mg/dL (ref 6–23)
CO2: 27 mEq/L (ref 19–32)
Calcium: 9.7 mg/dL (ref 8.4–10.5)
Chloride: 103 mEq/L (ref 96–112)
Creatinine, Ser: 1.04 mg/dL (ref 0.40–1.20)
GFR: 56.23 mL/min — ABNORMAL LOW (ref >60.00)
Glucose, Bld: 138 mg/dL — ABNORMAL HIGH (ref 70–99)
Potassium: 4.1 mEq/L (ref 3.5–5.1)
Sodium: 140 mEq/L (ref 135–145)
Total Bilirubin: 0.4 mg/dL (ref 0.2–1.2)
Total Protein: 7 g/dL (ref 6.0–8.3)

## 2020-09-06 LAB — CBC
HCT: 37.7 % (ref 36.0–46.0)
Hemoglobin: 12.7 g/dL (ref 12.0–15.0)
MCHC: 33.8 g/dL (ref 30.0–36.0)
MCV: 86.6 fl (ref 78.0–100.0)
Platelets: 242 10*3/uL (ref 150.0–400.0)
RBC: 4.35 Mil/uL (ref 3.87–5.11)
RDW: 14.2 % (ref 11.5–15.5)
WBC: 5.7 10*3/uL (ref 4.0–10.5)

## 2020-09-06 LAB — T4, FREE: Free T4: 0.95 ng/dL (ref 0.60–1.60)

## 2020-09-06 LAB — T3, FREE: T3, Free: 5.3 pg/mL — ABNORMAL HIGH (ref 2.3–4.2)

## 2020-09-06 LAB — TSH: TSH: 8.55 u[IU]/mL — ABNORMAL HIGH (ref 0.35–4.50)

## 2020-09-06 NOTE — Progress Notes (Signed)
ATIYANA WELTE , Dec 12, 1955, 65 y.o., female MRN: [id number] Patient Care Team    Relationship Specialty Notifications Start End  Ma Hillock, DO PCP - General Family Medicine  06/03/18   Minus Breeding, MD PCP - Cardiology Cardiology Admissions 11/14/17   Gatha Mayer, MD Consulting Physician Gastroenterology  06/05/18   Marshell Garfinkel, MD Consulting Physician Pulmonary Disease  06/05/18   Jacelyn Pi, MD Referring Physician Endocrinology  09/07/20     Chief Complaint  Patient presents with  . Hospitalization Follow-up    Still experiencing CP     Subjective:  [name] [name]  is a 65 y.o. female presents for hospital follow up after recent admission on 08/26/2020 for primary diagnosis right sided atypical chest pain. Patient was discharged on 08/28/2020 to home. Patients discharge summary has been reviewed, as well as all labs/image studies obtained during hospitalization.   Patients hospital course: Timmothy Sours is a 65 year old female with a history of CAD status post stent placement, hypertension, hyperlipidemia, hypothyroidism, diabetes that presented to the emergency room after experiencing chest pain when out working in the yard.  He describes the pain as a squeezing pressure-like pain.  She states it originally started on the right side of her chest but then has migrated to left of midsternal area.  She had taken a sublingual nitro at onset of symptoms which did not help improve her symptoms.  Cardiology was consulted on patient while she was in the hospital.  During her hospitalization patient was noted to have A. fib with RVR.  She had been placed on a Cardizem drip and IV heparin.  Once rate controlled she was started on Eliquis.  Cardiology recommended continuing aspirin 81 mg daily which she was unaware of.  She was started on metoprolol 25 mg twice daily for rate control and Imdur 15 mg daily for chest discomfort.  ZIO monitor has been placed.  Troponins were negative.  Her TSH  was rather elevated at 19.  When last checked in October by this provider it was within normal limits.  She states she had a checked one other time in March with her endocrine and it was mildly elevated at that time however medications were not changed. Since hospital discharge patient reports she is feeling well.  She is still experiencing intermittent chest pain without shortness of breath.  She states this can occur at times of activity or even when resting or sitting on the couch.  Pain occurs multiple times a day.  Pain can last anywhere from 2 to 3 minutes to several hours.  Pain does not radiate to upper extremity.  She is tolerating the changes in medication reports some mild fatigue. Patient does have follow-up with cardiology scheduled. DG Chest 2 View  Result Date: 08/26/2020 CLINICAL DATA:  Chest pain. EXAM: CHEST - 2 VIEW COMPARISON:  03/18/2018. FINDINGS: The heart size and mediastinal contours are within normal limits and similar to prior. Both lungs are clear. No visible pleural effusions or pneumothorax. No acute osseous abnormality. Similar appearance of the chest in comparison to prior. IMPRESSION: No active cardiopulmonary disease. Electronically Signed   By: Margaretha Sheffield MD   On: 08/26/2020 14:38   NM Myocar Multi W/Spect Tamela Oddi Motion / EF  Result Date: 08/27/2020 CLINICAL DATA:  Chest pain EXAM: MYOCARDIAL IMAGING WITH SPECT (REST AND PHARMACOLOGIC-STRESS) GATED LEFT VENTRICULAR WALL MOTION STUDY LEFT VENTRICULAR EJECTION FRACTION TECHNIQUE: Standard myocardial SPECT imaging was performed after resting intravenous injection of 10.4  mCi Tc-36m tetrofosmin. Subsequently, intravenous infusion of Lexiscan was performed under the supervision of the Cardiology staff. At peak effect of the drug, 32 mCi Tc-33m tetrofosmin was injected intravenously and standard myocardial SPECT imaging was performed. Quantitative gated imaging was also performed to evaluate left ventricular wall motion,  and estimate left ventricular ejection fraction. COMPARISON:  None. FINDINGS: Perfusion: No decreased activity in the left ventricle on stress imaging to suggest reversible ischemia or infarction. Wall Motion: Normal left ventricular wall motion. No left ventricular dilation. Left Ventricular Ejection Fraction: 76 % End diastolic volume 59 ml End systolic volume 14 ml IMPRESSION: 1. No reversible ischemia or infarction. 2. Normal left ventricular wall motion. 3. Left ventricular ejection fraction 76% 4. Non invasive risk stratification*: Low *2012 Appropriate Use Criteria for Coronary Revascularization Focused Update: J Am Coll Cardiol. 2549;82(6):415-830. http://content.[email or web address].aspx?articleid=[email or web address] Electronically Signed   By: Dorise Bullion III M.D   On: 08/27/2020 12:48   ECHOCARDIOGRAM COMPLETE  Result Date: 08/27/2020    ECHOCARDIOGRAM REPORT   Patient Name:   BREASIA KARGES Date of IMPRESSIONS  1. Left ventricular ejection fraction, by estimation, is 65 to 70%. The left ventricle has normal function. The left ventricle has no regional wall motion abnormalities. There is mild left ventricular hypertrophy. Left ventricular diastolic parameters are consistent with Grade I diastolic dysfunction (impaired relaxation).  2. Right ventricular systolic function is normal. The right ventricular size is normal.  3. The mitral valve is normal in structure. Trivial mitral valve regurgitation. No evidence of mitral stenosis.  4. The aortic valve is normal in structure. Aortic valve regurgitation is not visualized. No aortic stenosis is present.     Recent Labs  Lab 09/06/20 1216  HGB 12.7  HCT 37.7  WBC 5.7  PLT 242.0   CMP Latest Ref Rng & Units 09/06/2020 08/27/2020 08/26/2020  Glucose 70 - 99 mg/dL 138(H) 188(H) 177(H)  BUN 6 - 23 mg/dL $Remove'12 15 19  'JnPIzzY$ Creatinine 0.40 - 1.20 mg/dL 1.04 1.28(H) 1.24(H)  Sodium 135 - 145 mEq/L 140 140 137  Potassium 3.5 - 5.1 mEq/L 4.1 3.8 3.6  Chloride 96 - 112  mEq/L 103 107 102  CO2 19 - 32 mEq/L $Remove'27 26 23  'OKReTuK$ Calcium 8.4 - 10.5 mg/dL 9.7 9.0 9.4  Total Protein 6.0 - 8.3 g/dL 7.0 6.1(L) -  Total Bilirubin 0.2 - 1.2 mg/dL 0.4 0.5 -  Alkaline Phos 39 - 117 U/L 62 58 -  AST 0 - 37 U/L 18 19 -  ALT 0 - 35 U/L 22 23 -    Depression screen [employer] 2/9 09/06/2020 06/03/2018  Decreased Interest 0 2  Down, Depressed, Hopeless 0 1  PHQ - 2 Score 0 3  Altered sleeping - 0  Tired, decreased energy - 2  Change in appetite - 3  Feeling bad or failure about yourself  - 0  Trouble concentrating - 0  Moving slowly or fidgety/restless - 0  Suicidal thoughts - 0  PHQ-9 Score - 8  Some recent data might be hidden    Allergies  Allergen Reactions  . Epinephrine Other (See Comments)    Pass out   . Other     Other reaction(s): Myalgia  . Statins Other (See Comments)    Myalgias: Simvastatin also caused back ache  . Welchol [Colesevelam Hcl]     myalgia  . Amoxicillin Itching and Rash    Has patient had a PCN reaction causing immediate rash, facial/tongue/throat swelling, SOB or lightheadedness with hypotension: No Has  patient had a PCN reaction causing severe rash involving mucus membranes or skin necrosis: No Has patient had a PCN reaction that required hospitalization: No Has patient had a PCN reaction occurring within the last 10 years: Yes If all of the above answers are "NO", then may proceed with Cephalosporin use.    Social History   Tobacco Use  . Smoking status: Never Smoker  . Smokeless tobacco: Never Used  Substance Use Topics  . Alcohol use: Not Currently   Past Medical History:  Diagnosis Date  . Accelerating angina ([place]) 11/04/2017  . Asthma   . Chronic pain disorder    neck and shoulder  . CTS (carpal tunnel syndrome) 01/27/2013   right  . Diabetes mellitus type II   . Diabetic gastroparesis ([place])   . Esophageal reflux 08/25/2013   [place], Dr Barth Kirks  . FATTY LIVER DISEASE 04/27/2007   Qualifier: Diagnosis of  By:  Danelle Earthly CMA, Darlene    . Focal muscle atrophy 09/26/2015  . Fundic gland polyps of stomach, benign   . HTN (hypertension)   . Hyperlipidemia, mixed 05/04/2007   Qualifier: Diagnosis of  By: Wynona Luna crestor caused myalgias even at low dose Livalo caused myalgias Lipitor  Mother with severe reaction myalgias Simvastatin, Welchol   . Hypertension 08/24/2010  . Hypokalemia 05/25/2013   Improved stopping HCTZ. Was noted to have an elevated glucose when K was low. Recheck renal next week after starting Maxzide  . Hypomagnesemia 08/10/2014  . Hypothyroidism   . Iron deficiency anemia 08/07/2018  . Iron malabsorption 08/07/2018  . Laryngitis 08/25/2017  . Leg cramps 04/15/2016  . Nausea without vomiting 08/08/2014  . NONSPEC ELEVATION OF LEVELS OF TRANSAMINASE/LDH 05/01/2007   Qualifier: Diagnosis of  By: Garner Gavel    . Osteoarthritis    chronic, right knee (11/04/2017)  . OVARIAN CYST 07/11/2009   Qualifier: Diagnosis of  By: Wynona Luna   . Ovarian cyst   . Plantar fasciitis   . Rosacea 10/22/2016  . Urine incontinence    Past Surgical History:  Procedure Laterality Date  . AUGMENTATION MAMMAPLASTY Bilateral 2002  . BLADDER SUSPENSION  1981  . COLONOSCOPY    . CORONARY STENT INTERVENTION N/A 11/04/2017   Procedure: CORONARY STENT INTERVENTION;  Surgeon: Nelva Bush, MD;  Location: [facility];  Service: Cardiovascular;  Laterality: N/A;  . ENDOVENOUS ABLATION SAPHENOUS VEIN W/ LASER Left 12/29/2019   endovenous laser ablation left greater saphenous vein by Ruta Hinds MD   . ESOPHAGOGASTRODUODENOSCOPY    . INTRAVASCULAR PRESSURE WIRE/FFR STUDY N/A 11/04/2017   Procedure: INTRAVASCULAR PRESSURE WIRE/FFR STUDY;  Surgeon: Nelva Bush, MD;  Location: [facility];  Service: Cardiovascular;  Laterality: N/A;  . INTRAVASCULAR ULTRASOUND/IVUS N/A 11/04/2017   Procedure: Intravascular Ultrasound/IVUS;  Surgeon: Nelva Bush, MD;  Location: [facility];   Service: Cardiovascular;  Laterality: N/A;  . KNEE ARTHROSCOPY  1998, [id number]  . LAPAROSCOPIC CHOLECYSTECTOMY  2007  . LEFT HEART CATH AND CORONARY ANGIOGRAPHY N/A 11/04/2017   Procedure: LEFT HEART CATH AND CORONARY ANGIOGRAPHY;  Surgeon: Nelva Bush, MD;  Location: [facility];  Service: Cardiovascular;  Laterality: N/A;  . LEFT HEART CATHETERIZATION WITH CORONARY ANGIOGRAM N/A 11/21/2011   Procedure: LEFT HEART CATHETERIZATION WITH CORONARY ANGIOGRAM;  Surgeon: Josue Hector, MD;  Location: [facility];  Service: Cardiovascular;  Laterality: N/A;  . TONSILLECTOMY  1975  . VAGINAL HYSTERECTOMY  1983   "still have my  ovaries"   Family History  Problem Relation Age of Onset  . Alzheimer's disease Mother   . Diabetes type II Mother   . Hiatal hernia Mother   . Diabetes Mother   . Emphysema Father   . COPD Father   . Depression Sister        suicide  . Diabetes Sister   . Diabetes Daughter   . Colon cancer Neg Hx   . Stomach cancer Neg Hx    Allergies as of 09/06/2020      Reactions   Epinephrine Other (See Comments)   Pass out    Other    Other reaction(s): Myalgia   Statins Other (See Comments)   Myalgias: Simvastatin also caused back ache   Welchol [colesevelam Hcl]    myalgia   Amoxicillin Itching, Rash   Has patient had a PCN reaction causing immediate rash, facial/tongue/throat swelling, SOB or lightheadedness with hypotension: No Has patient had a PCN reaction causing severe rash involving mucus membranes or skin necrosis: No Has patient had a PCN reaction that required hospitalization: No Has patient had a PCN reaction occurring within the last 10 years: Yes If all of the above answers are "NO", then may proceed with Cephalosporin use.      Medication List       Accurate as of September 06, 2020 11:59 PM. If you have any questions, ask your nurse or doctor.        STOP taking these medications   aspirin 81 MG tablet Stopped by: Howard Pouch, DO    pioglitazone 30 MG tablet Commonly known as: ACTOS Stopped by: Howard Pouch, DO     TAKE these medications   acarbose 25 MG tablet Commonly known as: PRECOSE Take 1 tablet (25 mg total) by mouth 3 (three) times daily with meals.   Accu-Chek Guide test strip Generic drug: glucose blood Use 4 times daily as needed or directed.   Accu-Chek Softclix Lancets lancets Use 4 times daily as needed or directed   CALTRATE 600 PO Take 600 mg by mouth 2 (two) times daily.   colesevelam 625 MG tablet Commonly known as: Welchol Take 2 tablets (1,250 mg total) by mouth daily.   Eliquis 5 MG Tabs tablet Generic drug: apixaban Take 1 tablet (5 mg total) by mouth 2 (two) times daily.   famotidine 20 MG tablet Commonly known as: PEPCID Take 1 tablet (20 mg total) by mouth at bedtime.   fenofibrate 160 MG tablet Take 1 tablet (160 mg total) by mouth daily.   isosorbide mononitrate 30 MG 24 hr tablet Commonly known as: IMDUR Take 0.5 tablets (15 mg total) by mouth daily.   Jardiance 25 MG Tabs tablet Generic drug: empagliflozin Take 25 mg by mouth daily.   levothyroxine 88 MCG tablet Commonly known as: SYNTHROID Take one tablet daily on an empty stomach, 2 tabs on Saturday.   Magnesium 250 MG Tabs Take 1 tablet by mouth daily.   metFORMIN 500 MG tablet Commonly known as: GLUCOPHAGE Take 500 mg by mouth daily.   metoprolol tartrate 25 MG tablet Commonly known as: LOPRESSOR Take 1 tablet (25 mg total) by mouth 2 (two) times daily. Take extra dose for break though Afib with increased HR >100   nitroGLYCERIN 0.4 MG SL tablet Commonly known as: NITROSTAT Place 1 tablet (0.4 mg total) under the tongue every 5 (five) minutes as needed for chest pain.   pantoprazole 40 MG tablet Commonly known as: PROTONIX Take 1 tablet (40  mg total) by mouth 2 (two) times daily.   POTASSIUM CITRATE PO Take 99 mg by mouth daily.   PROBIOTIC DAILY PO Take 99 mg by mouth daily.   repaglinide  2 MG tablet Commonly known as: PRANDIN Take 2 mg by mouth in the morning and at bedtime.   Repatha SureClick 300 MG/ML Soaj Generic drug: Evolocumab Inject 1 Dose into the skin every 14 (fourteen) days.       All past medical history, surgical history, allergies, family history, immunizations and medications were updated in the EMR today and reviewed under the history and medication portions of their EMR.      ROS: Negative, with the exception of above mentioned in HPI   Objective:  BP (!) 100/59   Pulse 66   Temp 98 F (36.7 C) (Oral)   Ht $R'5\' 5"'lQ$  (1.651 m)   Wt 181 lb (82.1 kg)   SpO2 97%   BMI 30.12 kg/m  Body mass index is 30.12 kg/m. Gen: Afebrile. No acute distress. Nontoxic in appearance, well developed, well nourished.  Very pleasant female. HENT: AT. [place]. ] Eyes:Pupils Equal Round Reactive to light, Extraocular movements intact,  Conjunctiva without redness, discharge or icterus. Neck/lymp/endocrine: Supple, no lymphadenopathy, no thyromegaly CV: RRR no murmur, no edema Chest: CTAB, no wheeze or crackles. Good air movement, normal resp effort.  Skin: No rashes, purpura or petechiae.  No bruising Neuro:  Normal gait. PERLA. EOMi. Alert. Oriented x3  Psych: Normal affect, dress and demeanor. Normal speech. Normal thought content and judgment.  Assessment/Plan: [name] [name] is a 65 y.o. female present for OV for Hospital discharge follow up History of recent hospitalization Acquired hypothyroidism We will recheck thyroid panel today for accuracy.  If abnormal will change the thyroxine medication dose and follow-up closely with repeat labs.  Currently compliant with levothyroxine 88 mcg x 6 days and 2 tabs x1 day a week. - TSH - T3, free - T4, free - Comp Met (CMET) - CBC  Unstable angina (HCC)/CAD S/P percutaneous coronary angioplasty/ Other chest pain/Atrial fibrillation with RVR ([place]) Encourage patient to call her cardiologist, she is already established  with cardiology, to inform them she continues to have chest pain.  Her appointment is not for another 4 weeks, they should be able to work her in sooner if she continues to have chest pain.  She agreed. Continue metoprolol 25 mg twice daily Continue Eliquis 5 mg twice daily Continue IMDUR 15 mg daily Continue/restart ASA 81 mg daily Medications Supplied by cardiology  Chronic anticoagulation Tolerating start of Eliquis.  No reports of bleeding or bruising. She was unaware cardiology had recommended she continue the baby aspirin as well.  She will restart ASA 81 mg as soon as she gets home CBC collected today   Reviewed expectations re: course of current medical issues.  Discussed self-management of symptoms.  Outlined signs and symptoms indicating need for more acute intervention.  Patient verbalized understanding and all questions were answered.  Patient received an After-Visit Summary.  Any changes in medications were reviewed and patient was provided with updated med list with their AVS.      Orders Placed This Encounter  Procedures  . TSH  . T3, free  . T4, free  . Comp Met (CMET)  . CBC     Note is dictated utilizing voice recognition software. Although note has been proof read prior to signing, occasional typographical errors still can be missed. If any questions arise, please do not hesitate  to call for verification.   electronically signed by:  Howard Pouch, DO  [place]

## 2020-09-06 NOTE — Patient Instructions (Signed)
    We will call you with lab results.       

## 2020-09-07 ENCOUNTER — Telehealth: Payer: Self-pay | Admitting: Family Medicine

## 2020-09-07 ENCOUNTER — Encounter: Payer: Self-pay | Admitting: Family Medicine

## 2020-09-07 DIAGNOSIS — D6869 Other thrombophilia: Secondary | ICD-10-CM | POA: Insufficient documentation

## 2020-09-07 DIAGNOSIS — E039 Hypothyroidism, unspecified: Secondary | ICD-10-CM

## 2020-09-07 DIAGNOSIS — I4891 Unspecified atrial fibrillation: Secondary | ICD-10-CM | POA: Insufficient documentation

## 2020-09-07 DIAGNOSIS — Z7901 Long term (current) use of anticoagulants: Secondary | ICD-10-CM | POA: Insufficient documentation

## 2020-09-07 MED ORDER — LEVOTHYROXINE SODIUM 112 MCG PO TABS: 112.0000 ug | ORAL_TABLET | Freq: Every day | ORAL | 0 refills | Status: DC

## 2020-09-07 NOTE — Telephone Encounter (Signed)
Spoke with pt regarding labs and instructions.   

## 2020-09-07 NOTE — Telephone Encounter (Signed)
Please call patient Liver and kidney functions are normal Blood cell counts and electrolytes are normal> no signs of anemia Her sugar was actually better at 138 Her thyroid function has changed again a great deal and her TSH is now 8.5 from 19 in the hospital.  Since she said it was also mildly elevated last month at her endocrine, I went ahead and called in an increased dose of her levothyroxine.  She had been on an average of 100 mcg a day since she was taking 2 tabs on Saturday.  Her new dose called in is levothyroxine 112 mcg daily-the new tablets are called into her pharmacy.   - Follow-up in 6 weeks for lab appointment only to repeat her TSH to ensure changes made are enough.

## 2020-09-20 ENCOUNTER — Other Ambulatory Visit: Payer: Self-pay

## 2020-09-20 MED ORDER — APIXABAN 5 MG PO TABS: 5.0000 mg | ORAL_TABLET | Freq: Two times a day (BID) | ORAL | 1 refills | Status: DC

## 2020-09-20 MED ORDER — ISOSORBIDE MONONITRATE ER 30 MG PO TB24: 15.0000 mg | ORAL_TABLET | Freq: Every day | ORAL | 1 refills | Status: DC

## 2020-09-25 ENCOUNTER — Telehealth: Payer: Self-pay | Admitting: Cardiology

## 2020-09-25 NOTE — Telephone Encounter (Signed)
Spoke with the patient who states that her pharmacy will not fill her Imdur because it is written to take 1/2 tablet daily and the medication is not supposed to be split.  Call placed to patient's pharmacy ([facility]) to discuss. Message was left with the clinical team to call back to discuss.

## 2020-09-25 NOTE — Telephone Encounter (Signed)
Pt c/o medication issue:  1. Name of Medication: isosorbide mononitrate (IMDUR) 30 MG 24 hr tablet  2. How are you currently taking this medication (dosage and times per day)? .5 tablet a day  3. Are you having a reaction (difficulty breathing--STAT)? no  4. What is your medication issue? pharmacy telling patient that they cut the pill in half so they are unable to cut the prescription. Patient is wanting to know what she should do. Please advise

## 2020-09-27 NOTE — Progress Notes (Signed)
Cardiology Clinic Note   Patient Name: [name] [name] Date of Encounter: 09/29/2020  Primary Care Provider:  Ma Hillock, DO Primary Cardiologist:  Minus Breeding, MD  Patient Profile    [name] [name] 65 year old female presents to the clinic today for follow-up evaluation of her paroxysmal atrial fibrillation and coronary artery disease.  Past Medical History    Past Medical History:  Diagnosis Date  . Accelerating angina ([place]) 11/04/2017  . Asthma   . Chronic pain disorder    neck and shoulder  . Chronic pain of right upper extremity 11/10/2017  . CTS (carpal tunnel syndrome) 01/27/2013   right  . Diabetes mellitus type II   . Diabetic gastroparesis ([place])   . Esophageal reflux 08/25/2013   [place], Dr Barth Kirks  . FATTY LIVER DISEASE 04/27/2007   Qualifier: Diagnosis of  By: Danelle Earthly CMA, Darlene    . Focal muscle atrophy 09/26/2015  . Fundic gland polyps of stomach, benign   . Globus sensation 04/13/2018  . HTN (hypertension)   . Hyperlipidemia, mixed 05/04/2007   Qualifier: Diagnosis of  By: Wynona Luna crestor caused myalgias even at low dose Livalo caused myalgias Lipitor  Mother with severe reaction myalgias Simvastatin, Welchol   . Hypertension 08/24/2010  . Hypokalemia 05/25/2013   Improved stopping HCTZ. Was noted to have an elevated glucose when K was low. Recheck renal next week after starting Maxzide  . Hypomagnesemia 08/10/2014  . Hypothyroidism   . Iron deficiency anemia 08/07/2018  . Iron malabsorption 08/07/2018  . Laryngitis 08/25/2017  . Leg cramps 04/15/2016  . Nausea without vomiting 08/08/2014  . NONSPEC ELEVATION OF LEVELS OF TRANSAMINASE/LDH 05/01/2007   Qualifier: Diagnosis of  By: Garner Gavel    . Osteoarthritis    chronic, right knee (11/04/2017)  . OVARIAN CYST 07/11/2009   Qualifier: Diagnosis of  By: Wynona Luna   . Ovarian cyst   . Plantar fasciitis   . Rosacea 10/22/2016  . Urine incontinence    Past Surgical History:   Procedure Laterality Date  . AUGMENTATION MAMMAPLASTY Bilateral 2002  . BLADDER SUSPENSION  1981  . COLONOSCOPY    . CORONARY STENT INTERVENTION N/A 11/04/2017   Procedure: CORONARY STENT INTERVENTION;  Surgeon: Nelva Bush, MD;  Location: [facility];  Service: Cardiovascular;  Laterality: N/A;  . ENDOVENOUS ABLATION SAPHENOUS VEIN W/ LASER Left 12/29/2019   endovenous laser ablation left greater saphenous vein by Ruta Hinds MD   . ESOPHAGOGASTRODUODENOSCOPY    . INTRAVASCULAR PRESSURE WIRE/FFR STUDY N/A 11/04/2017   Procedure: INTRAVASCULAR PRESSURE WIRE/FFR STUDY;  Surgeon: Nelva Bush, MD;  Location: [facility];  Service: Cardiovascular;  Laterality: N/A;  . INTRAVASCULAR ULTRASOUND/IVUS N/A 11/04/2017   Procedure: Intravascular Ultrasound/IVUS;  Surgeon: Nelva Bush, MD;  Location: [facility];  Service: Cardiovascular;  Laterality: N/A;  . KNEE ARTHROSCOPY  1998, [id number]  . LAPAROSCOPIC CHOLECYSTECTOMY  2007  . LEFT HEART CATH AND CORONARY ANGIOGRAPHY N/A 11/04/2017   Procedure: LEFT HEART CATH AND CORONARY ANGIOGRAPHY;  Surgeon: Nelva Bush, MD;  Location: [facility];  Service: Cardiovascular;  Laterality: N/A;  . LEFT HEART CATHETERIZATION WITH CORONARY ANGIOGRAM N/A 11/21/2011   Procedure: LEFT HEART CATHETERIZATION WITH CORONARY ANGIOGRAM;  Surgeon: Josue Hector, MD;  [facility];  Service: Cardiovascular;  Laterality: N/A;  . TONSILLECTOMY  1975  . VAGINAL HYSTERECTOMY  1983   "still have my ovaries"    Allergies  Allergies  Allergen Reactions  . Epinephrine Other (See Comments)    Pass out   . Other     Other reaction(s): Myalgia  . Statins Other (See Comments)    Myalgias: Simvastatin also caused back ache  . Welchol [Colesevelam Hcl]     myalgia  . Amoxicillin Itching and Rash    Has patient had a PCN reaction causing immediate rash, facial/tongue/throat swelling, SOB or lightheadedness with  hypotension: No Has patient had a PCN reaction causing severe rash involving mucus membranes or skin necrosis: No Has patient had a PCN reaction that required hospitalization: No Has patient had a PCN reaction occurring within the last 10 years: Yes If all of the above answers are "NO", then may proceed with Cephalosporin use.     History of Present Illness    [name] is a PMH of coronary artery disease status post DES to ostial/proximal LAD in 6/19 with occlusion of D1 branch, low risk GXT and 7/21 but did not achieve target heart rate, HLD, HTN, hypothyroidism paroxysmal atrial fibrillation, and type 2 diabetes.  She was admitted to the hospital on 08/26/2020 and discharged on 08/28/2020.  She presented to the [facility] on 08/26/2020 for evaluation of chest pain.  She reported she had been helping her husband pick up sticks in the yard when she developed sudden onset sharp pain along her right pectoral region.  She reported feeling chilled and noted paresthesia down her right arm.  She took a sublingual nitroglycerin that reduced her pain however it did not fully resolve.  She presents to the emergency department for further evaluation.  She reported that her sharp pain resolved by overnight and the following morning she continued to have pressure in her chest that was radiating.  She reported the severity is mild but noticeable.  She reported that her previous anginal symptoms in 2019 were dyspnea on exertion.  She denied pain at that time.  She denied dyspnea on exertion, orthopnea, PND, lower extremity edema.  She reported compliance with her Pepcid and Protonix and felt the symptoms were much different from acid reflux.  Echocardiogram 08/27/2020 showed LVEF 65-70% G1 DD, trivial mitral valve regurgitation, and trivial pericardial effusion.  Nuclear stress test 08/27/2020 showed no reversible ischemia or infarct, EF 76% and low risk.  She developed paroxysmal atrial fibrillation 08/27/20 and  was placed on IV heparin and diltiazem gtt. She converted to sinus rhythm after 2 hours.  CHA2DS2-VASc score 5, TSH 19.14 with free T4 of 0.80.  She was symptomatic and reported jaw tingling and chest pressure with the episode.  A cardiac event monitor was ordered and showed predominantly normal sinus rhythm, sinus bradycardia, sinus tachycardia, isolated SVE's, no atrial flutter atrial fibrillation or pauses noted.  She presents to clinic today for follow-up evaluation states she has occasional episodes of increased heartbeat that lasted for brief periods and dissipate on their own.  She reports these are not bothersome and usually happen during the day while she was at work.  She also reports that she notices a cough in the morning after she gets up and was getting ready for work.  The cough lasts for a minute and dissipates without intervention.  She feels this may be related to palpitations and her coughing seems to correct the issue.  We reviewed her echocardiogram, and stress test results.  She expressed understanding.  She reports that she is working with her PCP on regulation of her thyroid.  We will continue her  current medication regimen, have her monitor her blood pressure, and follow-up in 3 months.  Today she denies chest pain, shortness of breath, lower extremity edema, fatigue, palpitations, melena, hematuria, hemoptysis, diaphoresis, weakness, presyncope, syncope, orthopnea, and PND.   Home Medications    Prior to Admission medications   Medication Sig Start Date End Date Taking? Authorizing Provider  acarbose (PRECOSE) 25 MG tablet Take 1 tablet (25 mg total) by mouth 3 (three) times daily with meals. 05/02/20   Renato Shin, MD  Accu-Chek Softclix Lancets lancets Use 4 times daily as needed or directed 03/09/20   Renato Shin, MD  apixaban (ELIQUIS) 5 MG TABS tablet Take 1 tablet (5 mg total) by mouth 2 (two) times daily. 09/20/20   Deberah Pelton, NP  aspirin EC 81 MG tablet Take  81 mg by mouth daily. Swallow whole.    [provider]  Calcium Carbonate (CALTRATE 600 PO) Take 600 mg by mouth 2 (two) times daily.     [provider]  colesevelam (WELCHOL) 625 MG tablet Take 2 tablets (1,250 mg total) by mouth daily. 03/12/20   Renato Shin, MD  Evolocumab (REPATHA SURECLICK) 283 MG/ML SOAJ Inject 1 Dose into the skin every 14 (fourteen) days. 02/07/20   Hilty, Nadean Corwin, MD  famotidine (PEPCID) 20 MG tablet Take 1 tablet (20 mg total) by mouth at bedtime. 05/31/20   Gatha Mayer, MD  fenofibrate 160 MG tablet Take 1 tablet (160 mg total) by mouth daily. 06/01/20   Kuneff, [name], DO  glucose blood (ACCU-CHEK GUIDE) test strip Use 4 times daily as needed or directed. 03/09/20   Renato Shin, MD  isosorbide mononitrate (IMDUR) 30 MG 24 hr tablet Take 0.5 tablets (15 mg total) by mouth daily. 09/20/20   Deberah Pelton, NP  JARDIANCE 25 MG TABS tablet Take 25 mg by mouth daily. 08/10/20   [provider]  levothyroxine (SYNTHROID) 112 MCG tablet Take 1 tablet (112 mcg total) by mouth daily before breakfast. Take one tablet daily on an empty stomach. 09/07/20   Kuneff, [name], DO  Magnesium 250 MG TABS Take 1 tablet by mouth daily.    [provider]  metFORMIN (GLUCOPHAGE) 500 MG tablet Take 500 mg by mouth daily.    [provider]  metoprolol tartrate (LOPRESSOR) 25 MG tablet Take 1 tablet (25 mg total) by mouth 2 (two) times daily. Take extra dose for break though Afib with increased HR >100 08/28/20   Rai, Ripudeep K, MD  nitroGLYCERIN (NITROSTAT) 0.4 MG SL tablet Place 1 tablet (0.4 mg total) under the tongue every 5 (five) minutes as needed for chest pain. 08/25/17   Mosie Lukes, MD  pantoprazole (PROTONIX) 40 MG tablet Take 1 tablet (40 mg total) by mouth 2 (two) times daily. 05/31/20   Gatha Mayer, MD  POTASSIUM CITRATE PO Take 99 mg by mouth daily.     [provider]  Probiotic Product (PROBIOTIC DAILY PO) Take 99  mg by mouth daily.     [provider]  repaglinide (PRANDIN) 2 MG tablet Take 2 mg by mouth in the morning and at bedtime.    [provider]    Family History    Family History  Problem Relation Age of Onset  . Alzheimer's disease Mother   . Diabetes type II Mother   . Hiatal hernia Mother   . Diabetes Mother   . Emphysema Father   . COPD Father   .  Depression Sister        suicide  . Diabetes Sister   . Diabetes Daughter   . Colon cancer Neg Hx   . Stomach cancer Neg Hx    She indicated that her mother is deceased. She indicated that her father is deceased. She indicated that both of her sisters are deceased. She indicated that her brother is alive. She indicated that her maternal grandmother is deceased. She indicated that her maternal grandfather is deceased. She indicated that her paternal grandmother is deceased. She indicated that her paternal grandfather is deceased. She indicated that the status of her daughter is unknown. She indicated that her son is alive. She indicated that the status of her neg hx is unknown.  Social History    Social History   Socioeconomic History  . Marital status: Married    Spouse name: Not on file  . Number of children: 1  . Years of education: Not on file  . Highest education level: Not on file  Occupational History  . Occupation: [employer]  Tobacco Use  . Smoking status: Never Smoker  . Smokeless tobacco: Never Used  Vaping Use  . Vaping Use: Never used  Substance and Sexual Activity  . Alcohol use: Not Currently  . Drug use: Never  . Sexual activity: Not Currently    Partners: Male  Other Topics Concern  . Not on file  Social History Narrative   Marital status/children/pets: Married, 1 child.    Education/employment: HS   Safety:      -smoke alarm in the home:Yes     - wears seatbelt: Yes     - Feels safe in their relationships: Yes   Social Determinants of Health   Financial  Resource Strain: Not on file  Food Insecurity: Not on file  Transportation Needs: Not on file  Physical Activity: Not on file  Stress: Not on file  Social Connections: Not on file  Intimate Partner Violence: Not on file     Review of Systems    General:  No chills, fever, night sweats or weight changes.  Cardiovascular:  No chest pain, dyspnea on exertion, edema, orthopnea, palpitations, paroxysmal nocturnal dyspnea. Dermatological: No rash, lesions/masses Respiratory: No cough, dyspnea Urologic: No hematuria, dysuria Abdominal:   No nausea, vomiting, diarrhea, bright red blood per rectum, melena, or hematemesis Neurologic:  No visual changes, wkns, changes in mental status. All other systems reviewed and are otherwise negative except as noted above.  Physical Exam    VS:  BP 122/61   Pulse 68   Ht 5\' 5"  (1.651 m)   Wt 179 lb 9.6 oz (81.5 kg)   SpO2 94%   BMI 29.89 kg/m  , BMI Body mass index is 29.89 kg/m. GEN: Well nourished, well developed, in no acute distress. HEENT: normal. Neck: Supple, no JVD, carotid bruits, or masses. Cardiac: RRR, no murmurs, rubs, or gallops. No clubbing, cyanosis, edema.  Radials/DP/PT 2+ and equal bilaterally.  Respiratory:  Respirations regular and unlabored, clear to auscultation bilaterally. GI: Soft, nontender, nondistended, BS + x 4. MS: no deformity or atrophy. Skin: warm and dry, no rash. Neuro:  Strength and sensation are intact. Psych: Normal affect.  Accessory Clinical Findings    Recent Labs: 02/25/2020: Magnesium 1.6 09/06/2020: ALT 22; BUN 12; Creatinine, Ser 1.04; Hemoglobin 12.7; Platelets 242.0; Potassium 4.1; Sodium 140; TSH 8.55   Recent Lipid Panel    Component Value Date/Time   CHOL 210 (H) 08/27/2020 0119  CHOL 228 (H) 03/31/2020 1300   TRIG 253 (H) 08/27/2020 0119   HDL 38 (L) 08/27/2020 0119   HDL 47 03/31/2020 1300   CHOLHDL 5.5 08/27/2020 0119   VLDL 51 (H) 08/27/2020 0119   LDLCALC 121 (H) 08/27/2020 0119    LDLCALC 119 (H) 03/31/2020 1300   LDLDIRECT 151.0 06/01/2015 0812    ECG personally reviewed by me today-none today.  Echocardiogram 08/27/2020 IMPRESSIONS    1. Left ventricular ejection fraction, by estimation, is 65 to 70%. The  left ventricle has normal function. The left ventricle has no regional  wall motion abnormalities. There is mild left ventricular hypertrophy.  Left ventricular diastolic parameters  are consistent with Grade I diastolic dysfunction (impaired relaxation).  2. Right ventricular systolic function is normal. The right ventricular  size is normal.  3. The mitral valve is normal in structure. Trivial mitral valve  regurgitation. No evidence of mitral stenosis.  4. The aortic valve is normal in structure. Aortic valve regurgitation is  not visualized. No aortic stenosis is present.   Comparison(s): A prior study was performed on 10/07/2017. No significant  change from prior study. Prior images reviewed side by side. Trivial  pericaridal effusion with prominent epicardial adipose layer is unchanged.  Nuclear stress test EXAM: MYOCARDIAL IMAGING WITH SPECT (REST AND PHARMACOLOGIC-STRESS)  GATED LEFT VENTRICULAR WALL MOTION STUDY  LEFT VENTRICULAR EJECTION FRACTION  TECHNIQUE: Standard myocardial SPECT imaging was performed after resting intravenous injection of 10.4 mCi Tc-53m tetrofosmin. Subsequently, intravenous infusion of Lexiscan was performed under the supervision of the Cardiology staff. At peak effect of the drug, 32 mCi Tc-11m tetrofosmin was injected intravenously and standard myocardial SPECT imaging was performed. Quantitative gated imaging was also performed to evaluate left ventricular wall motion, and estimate left ventricular ejection fraction.  COMPARISON:  None.  FINDINGS: Perfusion: No decreased activity in the left ventricle on stress imaging to suggest reversible ischemia or infarction.  Wall Motion: Normal left  ventricular wall motion. No left ventricular dilation.  Left Ventricular Ejection Fraction: 76 %  End diastolic volume 59 ml  End systolic volume 14 ml  IMPRESSION: 1. No reversible ischemia or infarction.  2. Normal left ventricular wall motion.  3. Left ventricular ejection fraction 76%  4. Non invasive risk stratification*: Low    Cardiac event monitor 09/21/2020 Patch Wear Time:  13 days and 17 hours (2022-04-04T12:16:39-0400 to 2022-04-18T06:12:29-0400)  Patient had a min HR of 45 bpm, max HR of 112 bpm, and avg HR of 67 bpm. Predominant underlying rhythm was Sinus Rhythm. Slight P wave morphology changes were noted. Isolated SVEs were rare (<1.0%), SVE Couplets were rare (<1.0%), and SVE Triplets were  rare (<1.0%). Isolated VEs were rare (<1.0%), and no VE Couplets or VE Triplets were present.   Assessment & Plan   1.  Atypical chest pain/chest wall pain- no chest pain today.  Admitted to the hospital 08/26/20 and discharged on 08/28/2020.  Troponins negative.  Stress test showed no ischemia and low risk.  Echocardiogram showed LVEF 65-70%, G1 DD, and trivial pericardial effusion.  Underwent cardiac catheterization with DES to ostial/proximal LAD 6/19. Continue to monitor Heart healthy low-sodium diet-salty 6 given Increase physical activity as tolerated  Coronary artery disease-no chest pain today. Underwent cardiac catheterization with DES to ostial/proximal LAD 6/19. Continue Imdur, Repatha, fenofibrate Heart healthy low-sodium diet-salty 6 given Increase physical activity as tolerated  Paroxysmal atrial fibrillation- heart rate today 68.  Cardiac event monitor showed predominantly normal sinus rhythm and rare SVE's,  no flutter, fibrillation, or pauses noted. Avoid triggers caffeine, chocolate, EtOH, dehydration etc. Continue apixaban Increase physical activity as tolerated   Essential hypertension-BP today 122/61.  Well-controlled at home. Continue  metoprolol, Imdur Heart healthy low-sodium diet-salty 6 given Increase physical activity as tolerated  Hyperlipidemia-LDL 121. Continue Repatha, fenofibrate, WelChol Are healthy low-sodium high-fiber diet Increase physical activity as tolerated  Type 2 diabetes- A1c 7.8 Continue metformin, Jardiance Follows with endocrinology  Hypothyroidism- TSH 19.142.  Reports she is having her medications adjusted. Follows with endocrinology/PCP  Disposition: Follow-up with Dr. Percival Spanish in 3 months.  Jossie Ng. Kashauna Celmer NP-C    09/29/2020, [address] Office [phone] Fax [phone]  Notice: This dictation was prepared with Dragon dictation along with smaller phrase technology. Any transcriptional errors that result from this process are unintentional and may not be corrected upon review.  I spent 15 minutes examining this patient, reviewing medications, and using patient centered shared decision making involving her cardiac care.  Prior to her visit I spent greater than 20 minutes reviewing her past medical history,  medications, and prior cardiac tests.

## 2020-09-28 NOTE — Telephone Encounter (Signed)
Spoke with Occupational psychologist at [facility] and advised that its okay for imdur 30 mg to be broken in half daily.

## 2020-09-29 ENCOUNTER — Encounter: Payer: Self-pay | Admitting: General Practice

## 2020-09-29 ENCOUNTER — Ambulatory Visit (INDEPENDENT_AMBULATORY_CARE_PROVIDER_SITE_OTHER): Payer: Medicare Other | Admitting: General Practice

## 2020-09-29 ENCOUNTER — Other Ambulatory Visit: Payer: Self-pay

## 2020-09-29 VITALS — BP 122/61 | HR 68 | Ht 65.0 in | Wt 179.6 lb

## 2020-09-29 DIAGNOSIS — E785 Hyperlipidemia, unspecified: Secondary | ICD-10-CM

## 2020-09-29 DIAGNOSIS — I10 Essential (primary) hypertension: Secondary | ICD-10-CM

## 2020-09-29 DIAGNOSIS — I480 Paroxysmal atrial fibrillation: Secondary | ICD-10-CM

## 2020-09-29 DIAGNOSIS — E038 Other specified hypothyroidism: Secondary | ICD-10-CM

## 2020-09-29 DIAGNOSIS — I2510 Atherosclerotic heart disease of native coronary artery without angina pectoris: Secondary | ICD-10-CM | POA: Diagnosis not present

## 2020-09-29 DIAGNOSIS — R0789 Other chest pain: Secondary | ICD-10-CM

## 2020-09-29 DIAGNOSIS — E119 Type 2 diabetes mellitus without complications: Secondary | ICD-10-CM

## 2020-09-29 NOTE — Patient Instructions (Signed)
Medication Instructions:  The current medical regimen is effective;  continue present plan and medications as directed. Please refer to the Current Medication list given to you today.  *If you need a refill on your cardiac medications before your next appointment, please call your pharmacy*  Lab Work:   Testing/Procedures:  NOND    NONE  Special Instructions PLEASE READ AND FOLLOW HEART HEALTHY DIET-ATTACHED  PLEASE MAINTAIN PHYSICAL ACTIVITY AS TOLERATED  TAKE AND LOG YOUR BLOOD PRESSURE AT LEASE 1 HOUR AFTER TAKING YOUR MEDICATION AND BRING LOG WITH YOU TO F/U APPT  Follow-Up: Your next appointment:  3-4 month(s) In Person with Minus Breeding, MD OR IF UNAVAILABLE [place], FNP-C   At [facility], you and your health needs are our priority.  As part of our continuing mission to provide you with exceptional heart care, we have created designated Provider Care Teams.  These Care Teams include your primary Cardiologist (physician) and Advanced Practice Providers (APPs -  Physician Assistants and Nurse Practitioners) who all work together to provide you with the care you need, when you need it.   Heart-Healthy Eating Plan Heart-healthy meal planning includes:  Eating less unhealthy fats.  Eating more healthy fats.  Making other changes in your diet.  What are tips for following this plan? Cooking Avoid frying your food. Try to bake, boil, grill, or broil it instead. You can also reduce fat by:  Removing the skin from poultry.  Removing all visible fats from meats.  Steaming vegetables in water or broth. Meal planning  At meals, divide your plate into four equal parts: ? Fill one-half of your plate with vegetables and green salads. ? Fill one-fourth of your plate with whole grains. ? Fill one-fourth of your plate with lean protein foods.  Eat 4-5 servings of vegetables per day. A serving of vegetables is: ? 1 cup of raw or cooked vegetables. ? 2 cups of raw leafy  greens.  Eat 4-5 servings of fruit per day. A serving of fruit is: ? 1 medium whole fruit. ?  cup of dried fruit. ?  cup of fresh, frozen, or canned fruit. ?  cup of 100% fruit juice.  Eat more foods that have soluble fiber. These are apples, broccoli, carrots, beans, peas, and barley. Try to get 20-30 g of fiber per day.  Eat 4-5 servings of nuts, legumes, and seeds per week: ? 1 serving of dried beans or legumes equals  cup after being cooked. ? 1 serving of nuts is  cup. ? 1 serving of seeds equals 1 tablespoon.   General information  Eat more home-cooked food. Eat less restaurant, buffet, and fast food.  Limit or avoid alcohol.  Limit foods that are high in starch and sugar.  Avoid fried foods.  Lose weight if you are overweight.  Keep track of how much salt (sodium) you eat. This is important if you have high blood pressure. Ask your doctor to tell you more about this.  Try to add vegetarian meals each week. Fats  Choose healthy fats. These include olive oil and canola oil, flaxseeds, walnuts, almonds, and seeds.  Eat more omega-3 fats. These include salmon, mackerel, sardines, tuna, flaxseed oil, and ground flaxseeds. Try to eat fish at least 2 times each week.  Check food labels. Avoid foods with trans fats or high amounts of saturated fat.  Limit saturated fats. ? These are often found in animal products, such as meats, butter, and cream. ? These are also found  in plant foods, such as palm oil, palm kernel oil, and coconut oil.  Avoid foods with partially hydrogenated oils in them. These have trans fats. Examples are stick margarine, some tub margarines, cookies, crackers, and other baked goods. What foods can I eat? Fruits All fresh, canned (in natural juice), or frozen fruits. Vegetables Fresh or frozen vegetables (raw, steamed, roasted, or grilled). Green salads. Grains Most grains. Choose whole wheat and whole grains most of the time. Rice and pasta,  including brown rice and pastas made with whole wheat. Meats and other proteins Lean, well-trimmed beef, veal, pork, and lamb. Chicken and [place] without skin. All fish and shellfish. Wild duck, rabbit, pheasant, and venison. Egg whites or low-cholesterol egg substitutes. Dried beans, peas, lentils, and tofu. Seeds and most nuts. Dairy Low-fat or nonfat cheeses, including ricotta and mozzarella. Skim or 1% milk that is liquid, powdered, or evaporated. Buttermilk that is made with low-fat milk. Nonfat or low-fat yogurt. Fats and oils Non-hydrogenated (trans-free) margarines. Vegetable oils, including soybean, sesame, sunflower, olive, peanut, safflower, corn, canola, and cottonseed. Salad dressings or mayonnaise made with a vegetable oil. Beverages Mineral water. Coffee and tea. Diet carbonated beverages. Sweets and desserts Sherbet, gelatin, and fruit ice. Small amounts of dark chocolate. Limit all sweets and desserts. Seasonings and condiments All seasonings and condiments. The items listed above may not be a complete list of foods and drinks you can eat. Contact a dietitian for more options. What foods should I avoid? Fruits Canned fruit in heavy syrup. Fruit in cream or butter sauce. Fried fruit. Limit coconut. Vegetables Vegetables cooked in cheese, cream, or butter sauce. Fried vegetables. Grains Breads that are made with saturated or trans fats, oils, or whole milk. Croissants. Sweet rolls. Donuts. High-fat crackers, such as cheese crackers. Meats and other proteins Fatty meats, such as hot dogs, ribs, sausage, bacon, rib-eye roast or steak. High-fat deli meats, such as salami and bologna. Caviar. Domestic duck and goose. Organ meats, such as liver. Dairy Cream, sour cream, cream cheese, and creamed cottage cheese. Whole-milk cheeses. Whole or 2% milk that is liquid, evaporated, or condensed. Whole buttermilk. Cream sauce or high-fat cheese sauce. Yogurt that is made from whole  milk. Fats and oils Meat fat, or shortening. Cocoa butter, hydrogenated oils, palm oil, coconut oil, palm kernel oil. Solid fats and shortenings, including bacon fat, salt pork, lard, and butter. Nondairy cream substitutes. Salad dressings with cheese or sour cream. Beverages Regular sodas and juice drinks with added sugar. Sweets and desserts Frosting. Pudding. Cookies. Cakes. Pies. Milk chocolate or white chocolate. Buttered syrups. Full-fat ice cream or ice cream drinks. The items listed above may not be a complete list of foods and drinks to avoid. Contact a dietitian for more information. Summary  Heart-healthy meal planning includes eating less unhealthy fats, eating more healthy fats, and making other changes in your diet.  Eat a balanced diet. This includes fruits and vegetables, low-fat or nonfat dairy, lean protein, nuts and legumes, whole grains, and heart-healthy oils and fats. This information is not intended to replace advice given to you by your health care provider. Make sure you discuss any questions you have with your health care provider. Document Revised: 07/17/2017 Document Reviewed: 06/20/2017 Elsevier Patient Education  2021 [employer].

## 2020-10-03 NOTE — Progress Notes (Deleted)
Subjective:   [name] [name] is a 65 y.o. female who presents for an Initial Medicare Annual Wellness Visit.  Review of Systems    ***       Objective:    There were no vitals filed for this visit. There is no height or weight on file to calculate BMI.  Advanced Directives 08/27/2020 08/26/2020 08/27/2018 06/26/2018 11/04/2017 11/04/2017 11/28/2014  Does Patient Have a Medical Advance Directive? Yes Yes Yes Yes Yes Yes Yes  Type of [employer] [place];Living will Living will;Healthcare Power of [place];Living will [place];Living will Living will  Does patient want to make changes to medical advance directive? No - Patient declined - No - Guardian declined No - Patient declined No - Patient declined - -  Copy of [place] in Chart? No - copy requested - No - copy requested No - copy requested No - copy requested No - copy requested -    Current Medications (verified) Outpatient Encounter Medications as of 10/04/2020  Medication Sig  . acarbose (PRECOSE) 25 MG tablet Take 1 tablet (25 mg total) by mouth 3 (three) times daily with meals.  . Accu-Chek Softclix Lancets lancets Use 4 times daily as needed or directed  . apixaban (ELIQUIS) 5 MG TABS tablet Take 1 tablet (5 mg total) by mouth 2 (two) times daily.  Marland Kitchen aspirin EC 81 MG tablet Take 81 mg by mouth daily. Swallow whole.  . Calcium Carbonate (CALTRATE 600 PO) Take 600 mg by mouth 2 (two) times daily.   . colesevelam (WELCHOL) 625 MG tablet Take 2 tablets (1,250 mg total) by mouth daily.  . Evolocumab (REPATHA SURECLICK) 008 MG/ML SOAJ Inject 1 Dose into the skin every 14 (fourteen) days.  . famotidine (PEPCID) 20 MG tablet Take 1 tablet (20 mg total) by mouth at bedtime.  . fenofibrate 160 MG tablet Take 1 tablet (160 mg total) by mouth daily.  Marland Kitchen glucose blood (ACCU-CHEK GUIDE) test strip Use 4 times daily as  needed or directed.  . isosorbide mononitrate (IMDUR) 30 MG 24 hr tablet Take 0.5 tablets (15 mg total) by mouth daily.  Marland Kitchen JARDIANCE 25 MG TABS tablet Take 25 mg by mouth daily.  Marland Kitchen levothyroxine (SYNTHROID) 112 MCG tablet Take 1 tablet (112 mcg total) by mouth daily before breakfast. Take one tablet daily on an empty stomach.  . Magnesium 250 MG TABS Take 1 tablet by mouth daily.  . metFORMIN (GLUCOPHAGE) 500 MG tablet Take 500 mg by mouth daily.  . metoprolol tartrate (LOPRESSOR) 25 MG tablet Take 1 tablet (25 mg total) by mouth 2 (two) times daily. Take extra dose for break though Afib with increased HR >100  . nitroGLYCERIN (NITROSTAT) 0.4 MG SL tablet Place 1 tablet (0.4 mg total) under the tongue every 5 (five) minutes as needed for chest pain.  . pantoprazole (PROTONIX) 40 MG tablet Take 1 tablet (40 mg total) by mouth 2 (two) times daily.  Marland Kitchen POTASSIUM CITRATE PO Take 99 mg by mouth daily.   . Probiotic Product (PROBIOTIC DAILY PO) Take 99 mg by mouth daily.   . repaglinide (PRANDIN) 2 MG tablet Take 2 mg by mouth in the morning and at bedtime.   No facility-administered encounter medications on file as of 10/04/2020.    Allergies (verified) Epinephrine, Other, Statins, Welchol [colesevelam hcl], and Amoxicillin   History: Past Medical History:  Diagnosis Date  . Accelerating  angina ([place]) 11/04/2017  . Asthma   . Chronic pain disorder    neck and shoulder  . Chronic pain of right upper extremity 11/10/2017  . CTS (carpal tunnel syndrome) 01/27/2013   right  . Diabetes mellitus type II   . Diabetic gastroparesis ([place])   . Esophageal reflux 08/25/2013   [place], Dr Barth Kirks  . FATTY LIVER DISEASE 04/27/2007   Qualifier: Diagnosis of  By: Danelle Earthly CMA, Darlene    . Focal muscle atrophy 09/26/2015  . Fundic gland polyps of stomach, benign   . Globus sensation 04/13/2018  . HTN (hypertension)   . Hyperlipidemia, mixed 05/04/2007   Qualifier: Diagnosis of  By: Wynona Luna  crestor caused myalgias even at low dose Livalo caused myalgias Lipitor  Mother with severe reaction myalgias Simvastatin, Welchol   . Hypertension 08/24/2010  . Hypokalemia 05/25/2013   Improved stopping HCTZ. Was noted to have an elevated glucose when K was low. Recheck renal next week after starting Maxzide  . Hypomagnesemia 08/10/2014  . Hypothyroidism   . Iron deficiency anemia 08/07/2018  . Iron malabsorption 08/07/2018  . Laryngitis 08/25/2017  . Leg cramps 04/15/2016  . Nausea without vomiting 08/08/2014  . NONSPEC ELEVATION OF LEVELS OF TRANSAMINASE/LDH 05/01/2007   Qualifier: Diagnosis of  By: Garner Gavel    . Osteoarthritis    chronic, right knee (11/04/2017)  . OVARIAN CYST 07/11/2009   Qualifier: Diagnosis of  By: Wynona Luna   . Ovarian cyst   . Plantar fasciitis   . Rosacea 10/22/2016  . Urine incontinence    Past Surgical History:  Procedure Laterality Date  . AUGMENTATION MAMMAPLASTY Bilateral 2002  . BLADDER SUSPENSION  1981  . COLONOSCOPY    . CORONARY STENT INTERVENTION N/A 11/04/2017   Procedure: CORONARY STENT INTERVENTION;  Surgeon: Nelva Bush, MD;  Location: [place] [facility];  Service: Cardiovascular;  Laterality: N/A;  . ENDOVENOUS ABLATION SAPHENOUS VEIN W/ LASER Left 12/29/2019   endovenous laser ablation left greater saphenous vein by Ruta Hinds MD   . ESOPHAGOGASTRODUODENOSCOPY    . INTRAVASCULAR PRESSURE WIRE/FFR STUDY N/A 11/04/2017   Procedure: INTRAVASCULAR PRESSURE WIRE/FFR STUDY;  Surgeon: Nelva Bush, MD;  Location: [place] [facility];  Service: Cardiovascular;  Laterality: N/A;  . INTRAVASCULAR ULTRASOUND/IVUS N/A 11/04/2017   Procedure: Intravascular Ultrasound/IVUS;  Surgeon: Nelva Bush, MD;  Location: [place] [facility];  Service: Cardiovascular;  Laterality: N/A;  . KNEE ARTHROSCOPY  1998, [id number]  . LAPAROSCOPIC CHOLECYSTECTOMY  2007  . LEFT HEART CATH AND CORONARY ANGIOGRAPHY N/A 11/04/2017   Procedure: LEFT HEART CATH  AND CORONARY ANGIOGRAPHY;  Surgeon: Nelva Bush, MD;  Location: [place] [facility];  Service: Cardiovascular;  Laterality: N/A;  . LEFT HEART CATHETERIZATION WITH CORONARY ANGIOGRAM N/A 11/21/2011   Procedure: LEFT HEART CATHETERIZATION WITH CORONARY ANGIOGRAM;  Surgeon: Josue Hector, MD;  Location: [facility];  Service: Cardiovascular;  Laterality: N/A;  . TONSILLECTOMY  1975  . VAGINAL HYSTERECTOMY  1983   "still have my ovaries"   Family History  Problem Relation Age of Onset  . Alzheimer's disease Mother   . Diabetes type II Mother   . Hiatal hernia Mother   . Diabetes Mother   . Emphysema Father   . COPD Father   . Depression Sister        suicide  . Diabetes Sister   . Diabetes Daughter   . Colon cancer Neg Hx   . Stomach cancer Neg Hx  Social History   Socioeconomic History  . Marital status: Married    Spouse name: Not on file  . Number of children: 1  . Years of education: Not on file  . Highest education level: Not on file  Occupational History  . Occupation: [employer]  Tobacco Use  . Smoking status: Never Smoker  . Smokeless tobacco: Never Used  Vaping Use  . Vaping Use: Never used  Substance and Sexual Activity  . Alcohol use: Not Currently  . Drug use: Never  . Sexual activity: Not Currently    Partners: Male  Other Topics Concern  . Not on file  Social History Narrative   Marital status/children/pets: Married, 1 child.    Education/employment: HS   Safety:      -smoke alarm in the home:Yes     - wears seatbelt: Yes     - Feels safe in their relationships: Yes   Social Determinants of Health   Financial Resource Strain: Not on file  Food Insecurity: Not on file  Transportation Needs: Not on file  Physical Activity: Not on file  Stress: Not on file  Social Connections: Not on file    Tobacco Counseling Counseling given: Not Answered   Clinical Intake:                 Diabetes:  Is the patient  diabetic?  Yes  If diabetic, was a CBG obtained today?  No  Did the patient bring in their glucometer from home?  No  How often do you monitor your CBG's? ***.   Financial Strains and Diabetes Management:  Are you having any financial strains with the device, your supplies or your medication? {YES/NO:21197}.  Does the patient want to be seen by Chronic Care Management for management of their diabetes?  {YES/NO:21197} Would the patient like to be referred to a Nutritionist or for Diabetic Management?  {YES/NO:21197}  Diabetic Exams:  Diabetic Eye Exam: Completed ***. Overdue for diabetic eye exam. Pt has been advised about the importance in completing this exam. A referral has been placed today. Message sent to referral coordinator for scheduling purposes. Advised pt to expect a call from our office re: appt.  Diabetic Foot Exam: Completed 05/02/2020.          Activities of Daily Living In your present state of health, do you have any difficulty performing the following activities: 08/27/2020  Hearing? N  Vision? N  Difficulty concentrating or making decisions? N  Walking or climbing stairs? N  Dressing or bathing? N  Doing errands, shopping? N  Some recent data might be hidden    Patient Care Team: Ma Hillock, DO as PCP - General (Family Medicine) Minus Breeding, MD as PCP - Cardiology (Cardiology) Gatha Mayer, MD as Consulting Physician (Gastroenterology) Marshell Garfinkel, MD as Consulting Physician (Pulmonary Disease) Jacelyn Pi, MD as Referring Physician (Endocrinology)  Indicate any recent Medical Services you may have received from other than [facility] providers in the past year (date may be approximate).     Assessment:   This is a routine wellness examination for [name] [name].  Hearing/Vision screen No exam data present  Dietary issues and exercise activities discussed:    Goals Addressed   None    Depression Screen PHQ 2/9 Scores 09/06/2020 06/03/2018  09/06/2014  PHQ - 2 Score 0 3 0  PHQ- 9 Score - 8 -    Fall Risk Fall Risk  09/06/2020 09/06/2014  Falls in the past year?  0 No  Number falls in past yr: 0 -  Injury with Fall? 0 -    FALL RISK PREVENTION PERTAINING TO THE HOME:  Any stairs in or around the home? {YES/NO:21197} If so, are there any without handrails? {YES/NO:21197} Home free of loose throw rugs in walkways, pet beds, electrical cords, etc? {YES/NO:21197} Adequate lighting in your home to reduce risk of falls? {YES/NO:21197}  ASSISTIVE DEVICES UTILIZED TO PREVENT FALLS:  Life alert? {YES/NO:21197} Use of a cane, walker or w/c? {YES/NO:21197} Grab bars in the bathroom? {YES/NO:21197} Shower chair or bench in shower? {YES/NO:21197} Elevated toilet seat or a handicapped toilet? {YES/NO:21197}  TIMED UP AND GO:  Was the test performed? {YES/NO:21197}.  Length of time to ambulate 10 feet: *** sec.   {Appearance of GA:4730917  Cognitive Function:        Immunizations Immunization History  Administered Date(s) Administered  . Fluad Quad(high Dose 65+) 02/25/2020  . Influenza Split 03/27/2011, 04/08/2012  . Influenza Whole 03/14/2009, 02/06/2010  . Influenza,inj,Quad PF,6+ Mos 01/27/2013, 05/02/2014, 04/12/2017, 03/31/2018, 03/15/2019  . PFIZER(Purple Top)SARS-COV-2 Vaccination 08/08/2019, 09/01/2019  . Pneumococcal Polysaccharide-23 03/28/2008  . Tdap 09/18/2011  . Zoster 04/15/2016    TDAP status: Up to date  Flu Vaccine status: Up to date  {Pneumococcal vaccine status:2101807}  {Covid-19 vaccine status:2101808}  Qualifies for Shingles Vaccine? Yes   Zostavax completed Yes   Shingrix Completed?: No.    Education has been provided regarding the importance of this vaccine. Patient has been advised to call insurance company to determine out of pocket expense if they have not yet received this vaccine. Advised may also receive vaccine at local pharmacy or Health Dept. Verbalized acceptance and  understanding.  Screening Tests Health Maintenance  Topic Date Due  . COVID-19 Vaccine (3 - Booster for Pfizer series) 03/02/2020  . OPHTHALMOLOGY EXAM  08/31/2020  . URINE MICROALBUMIN  10/06/2020 (Originally 06/04/2019)  . MAMMOGRAM  09/06/2021 (Originally 05/19/2020)  . DEXA SCAN  09/06/2021 (Originally 09/29/2019)  . COLONOSCOPY (Pts 45-3yrs Insurance coverage will need to be confirmed)  09/06/2021 (Originally 09/13/2017)  . PNA vac Low Risk Adult (1 of 2 - PCV13) 09/06/2021 (Originally 09/29/2019)  . HEMOGLOBIN A1C  10/31/2020  . INFLUENZA VACCINE  12/25/2020  . FOOT EXAM  05/02/2021  . TETANUS/TDAP  09/17/2021  . Hepatitis C Screening  Completed  . HPV VACCINES  Aged Out    Health Maintenance  Health Maintenance Due  Topic Date Due  . COVID-19 Vaccine (3 - Booster for Pfizer series) 03/02/2020  . OPHTHALMOLOGY EXAM  08/31/2020    {Colorectal cancer screening:2101809}  {Mammogram status:21018020}  {Bone Density status:21018021}  Lung Cancer Screening: (Low Dose CT Chest recommended if Age 64-80 years, 30 pack-year currently smoking OR have quit w/in 15years.) does not qualify.     Additional Screening:  Hepatitis C Screening:Completed 11/25/2014  Vision Screening: Recommended annual ophthalmology exams for early detection of glaucoma and other disorders of the eye. Is the patient up to date with their annual eye exam?  {YES/NO:21197} Who is the provider or what is the name of the office in which the patient attends annual eye exams? *** If pt is not established with a provider, would they like to be referred to a provider to establish care? {YES/NO:21197}.   Dental Screening: Recommended annual dental exams for proper oral hygiene  Community Resource Referral / Chronic Care Management: CRR required this visit?  {YES/NO:21197}  CCM required this visit?  {YES/NO:21197}     Plan:  I have personally reviewed and noted the following in the patient's chart:    . Medical and social history . Use of alcohol, tobacco or illicit drugs  . Current medications and supplements including opioid prescriptions. {Opioid [phone]} . Functional ability and status . Nutritional status . Physical activity . Advanced directives . List of other physicians . Hospitalizations, surgeries, and ER visits in previous 12 months . Vitals . Screenings to include cognitive, depression, and falls . Referrals and appointments  In addition, I have reviewed and discussed with patient certain preventive protocols, quality metrics, and best practice recommendations. A written personalized care plan for preventive services as well as general preventive health recommendations were provided to patient.     Marta Antu, LPN   [id number]  Nurse Health Advisor  Nurse Notes: ***

## 2020-10-04 ENCOUNTER — Ambulatory Visit: Payer: Medicare Other

## 2020-10-04 ENCOUNTER — Other Ambulatory Visit: Payer: Self-pay

## 2020-10-10 ENCOUNTER — Other Ambulatory Visit: Payer: Self-pay

## 2020-10-10 DIAGNOSIS — K219 Gastro-esophageal reflux disease without esophagitis: Secondary | ICD-10-CM

## 2020-10-10 MED ORDER — PANTOPRAZOLE SODIUM 40 MG PO TBEC: 40.0000 mg | DELAYED_RELEASE_TABLET | Freq: Two times a day (BID) | ORAL | 0 refills | Status: DC

## 2020-10-10 MED ORDER — FAMOTIDINE 20 MG PO TABS: 20.0000 mg | ORAL_TABLET | Freq: Every day | ORAL | 0 refills | Status: DC

## 2020-10-10 NOTE — Telephone Encounter (Signed)
Pantoprazole and famotidine refilled as pharmacy requested and note attached to call for appointment.

## 2020-10-20 ENCOUNTER — Other Ambulatory Visit: Payer: Self-pay

## 2020-10-20 ENCOUNTER — Ambulatory Visit (INDEPENDENT_AMBULATORY_CARE_PROVIDER_SITE_OTHER): Payer: Medicare Other

## 2020-10-20 DIAGNOSIS — E039 Hypothyroidism, unspecified: Secondary | ICD-10-CM

## 2020-10-20 DIAGNOSIS — I2510 Atherosclerotic heart disease of native coronary artery without angina pectoris: Secondary | ICD-10-CM

## 2020-10-20 DIAGNOSIS — E785 Hyperlipidemia, unspecified: Secondary | ICD-10-CM

## 2020-10-20 LAB — TSH: TSH: 0.49 u[IU]/mL (ref 0.35–4.50)

## 2020-10-20 NOTE — Addendum Note (Signed)
Addended by: Octaviano Glow on: 10/20/2020 08:50 AM   Modules accepted: Orders

## 2020-10-25 ENCOUNTER — Other Ambulatory Visit: Payer: Self-pay | Admitting: Family Medicine

## 2020-10-25 DIAGNOSIS — E039 Hypothyroidism, unspecified: Secondary | ICD-10-CM

## 2020-10-25 MED ORDER — LEVOTHYROXINE SODIUM 112 MCG PO TABS: 112.0000 ug | ORAL_TABLET | Freq: Every day | ORAL | 4 refills | Status: DC

## 2020-11-01 ENCOUNTER — Ambulatory Visit: Payer: Medicare Other

## 2020-11-01 ENCOUNTER — Other Ambulatory Visit: Payer: Self-pay

## 2020-11-01 NOTE — Progress Notes (Deleted)
Subjective:   [name] [name] is a 65 y.o. female who presents for an Initial Medicare Annual Wellness Visit.  Virtual Visit via Telephone Note  I connected with [name] [name] on 11/01/20 at  8:15 AM EDT by telephone and verified that I am speaking with the correct person using two identifiers.  Location: Patient: *** Provider: ***   I discussed the limitations, risks, security and privacy concerns of performing an evaluation and management service by telephone and the availability of in person appointments. I also discussed with the patient that there may be a patient responsible charge related to this service. The patient expressed understanding and agreed to proceed.   History of Present Illness:    Observations/Objective:   Assessment and Plan:   Follow Up Instructions:    I discussed the assessment and treatment plan with the patient. The patient was provided an opportunity to ask questions and all were answered. The patient agreed with the plan and demonstrated an understanding of the instructions.   The patient was advised to call back or seek an in-person evaluation if the symptoms worsen or if the condition fails to improve as anticipated.  I provided 30 minutes of non-face-to-face time during this encounter.   Leroy Kennedy, LPN   Review of Systems     NA       Objective:    There were no vitals filed for this visit. There is no height or weight on file to calculate BMI.  Advanced Directives 08/27/2020 08/26/2020 08/27/2018 06/26/2018 11/04/2017 11/04/2017 11/28/2014  Does Patient Have a Medical Advance Directive? Yes Yes Yes Yes Yes Yes Yes  Type of [employer] [place];Living will Living will;Healthcare Power of [place];Living will [place];Living will Living will  Does patient want to make changes to medical advance directive? No - Patient declined - No -  Guardian declined No - Patient declined No - Patient declined - -  Copy of [place] in Chart? No - copy requested - No - copy requested No - copy requested No - copy requested No - copy requested -    Current Medications (verified) Outpatient Encounter Medications as of 11/01/2020  Medication Sig  . acarbose (PRECOSE) 25 MG tablet Take 1 tablet (25 mg total) by mouth 3 (three) times daily with meals.  . Accu-Chek Softclix Lancets lancets Use 4 times daily as needed or directed  . apixaban (ELIQUIS) 5 MG TABS tablet Take 1 tablet (5 mg total) by mouth 2 (two) times daily.  Marland Kitchen aspirin EC 81 MG tablet Take 81 mg by mouth daily. Swallow whole.  . Calcium Carbonate (CALTRATE 600 PO) Take 600 mg by mouth 2 (two) times daily.   . colesevelam (WELCHOL) 625 MG tablet Take 2 tablets (1,250 mg total) by mouth daily.  . Evolocumab (REPATHA SURECLICK) 099 MG/ML SOAJ Inject 1 Dose into the skin every 14 (fourteen) days.  . famotidine (PEPCID) 20 MG tablet Take 1 tablet (20 mg total) by mouth at bedtime.  . fenofibrate 160 MG tablet Take 1 tablet (160 mg total) by mouth daily.  Marland Kitchen glucose blood (ACCU-CHEK GUIDE) test strip Use 4 times daily as needed or directed.  . isosorbide mononitrate (IMDUR) 30 MG 24 hr tablet Take 0.5 tablets (15 mg total) by mouth daily.  Marland Kitchen JARDIANCE 25 MG TABS tablet Take 25 mg by mouth daily.  Marland Kitchen levothyroxine (SYNTHROID) 112 MCG tablet Take 1  tablet (112 mcg total) by mouth daily before breakfast. Take one tablet daily on an empty stomach.  . Magnesium 250 MG TABS Take 1 tablet by mouth daily.  . metFORMIN (GLUCOPHAGE) 500 MG tablet Take 500 mg by mouth daily.  . metoprolol tartrate (LOPRESSOR) 25 MG tablet Take 1 tablet (25 mg total) by mouth 2 (two) times daily. Take extra dose for break though Afib with increased HR >100  . nitroGLYCERIN (NITROSTAT) 0.4 MG SL tablet Place 1 tablet (0.4 mg total) under the tongue every 5 (five) minutes as needed for chest pain.  .  pantoprazole (PROTONIX) 40 MG tablet Take 1 tablet (40 mg total) by mouth 2 (two) times daily.  Marland Kitchen POTASSIUM CITRATE PO Take 99 mg by mouth daily.   . Probiotic Product (PROBIOTIC DAILY PO) Take 99 mg by mouth daily.   . repaglinide (PRANDIN) 2 MG tablet Take 2 mg by mouth in the morning and at bedtime.   No facility-administered encounter medications on file as of 11/01/2020.    Allergies (verified) Epinephrine, Other, Statins, Welchol [colesevelam hcl], and Amoxicillin   History: Past Medical History:  Diagnosis Date  . Accelerating angina ([place]) 11/04/2017  . Asthma   . Chronic pain disorder    neck and shoulder  . Chronic pain of right upper extremity 11/10/2017  . CTS (carpal tunnel syndrome) 01/27/2013   right  . Diabetes mellitus type II   . Diabetic gastroparesis ([place])   . Esophageal reflux 08/25/2013   [place], Dr Barth Kirks  . FATTY LIVER DISEASE 04/27/2007   Qualifier: Diagnosis of  By: Danelle Earthly CMA, Darlene    . Focal muscle atrophy 09/26/2015  . Fundic gland polyps of stomach, benign   . Globus sensation 04/13/2018  . HTN (hypertension)   . Hyperlipidemia, mixed 05/04/2007   Qualifier: Diagnosis of  By: Wynona Luna crestor caused myalgias even at low dose Livalo caused myalgias Lipitor  Mother with severe reaction myalgias Simvastatin, Welchol   . Hypertension 08/24/2010  . Hypokalemia 05/25/2013   Improved stopping HCTZ. Was noted to have an elevated glucose when K was low. Recheck renal next week after starting Maxzide  . Hypomagnesemia 08/10/2014  . Hypothyroidism   . Iron deficiency anemia 08/07/2018  . Iron malabsorption 08/07/2018  . Laryngitis 08/25/2017  . Leg cramps 04/15/2016  . Nausea without vomiting 08/08/2014  . NONSPEC ELEVATION OF LEVELS OF TRANSAMINASE/LDH 05/01/2007   Qualifier: Diagnosis of  By: Garner Gavel    . Osteoarthritis    chronic, right knee (11/04/2017)  . OVARIAN CYST 07/11/2009   Qualifier: Diagnosis of  By: Wynona Luna   .  Ovarian cyst   . Plantar fasciitis   . Rosacea 10/22/2016  . Urine incontinence    Past Surgical History:  Procedure Laterality Date  . AUGMENTATION MAMMAPLASTY Bilateral 2002  . BLADDER SUSPENSION  1981  . COLONOSCOPY    . CORONARY STENT INTERVENTION N/A 11/04/2017   Procedure: CORONARY STENT INTERVENTION;  Surgeon: Nelva Bush, MD;  Location: [facility];  Service: Cardiovascular;  Laterality: N/A;  . ENDOVENOUS ABLATION SAPHENOUS VEIN W/ LASER Left 12/29/2019   endovenous laser ablation left greater saphenous vein by Ruta Hinds MD   . ESOPHAGOGASTRODUODENOSCOPY    . INTRAVASCULAR PRESSURE WIRE/FFR STUDY N/A 11/04/2017   Procedure: INTRAVASCULAR PRESSURE WIRE/FFR STUDY;  Surgeon: Nelva Bush, MD;  Location: [facility];  Service: Cardiovascular;  Laterality: N/A;  . INTRAVASCULAR ULTRASOUND/IVUS N/A 11/04/2017   Procedure: Intravascular Ultrasound/IVUS;  Surgeon: Nelva Bush, MD;  Location: [facility];  Service: Cardiovascular;  Laterality: N/A;  . KNEE ARTHROSCOPY  1998, [id number]  . LAPAROSCOPIC CHOLECYSTECTOMY  2007  . LEFT HEART CATH AND CORONARY ANGIOGRAPHY N/A 11/04/2017   Procedure: LEFT HEART CATH AND CORONARY ANGIOGRAPHY;  Surgeon: Nelva Bush, MD;  Location: [facility];  Service: Cardiovascular;  Laterality: N/A;  . LEFT HEART CATHETERIZATION WITH CORONARY ANGIOGRAM N/A 11/21/2011   Procedure: LEFT HEART CATHETERIZATION WITH CORONARY ANGIOGRAM;  Surgeon: Josue Hector, MD;  Location: [facility];  Service: Cardiovascular;  Laterality: N/A;  . TONSILLECTOMY  1975  . VAGINAL HYSTERECTOMY  1983   "still have my ovaries"   Family History  Problem Relation Age of Onset  . Alzheimer's disease Mother   . Diabetes type II Mother   . Hiatal hernia Mother   . Diabetes Mother   . Emphysema Father   . COPD Father   . Depression Sister        suicide  . Diabetes Sister   . Diabetes Daughter   . Colon cancer Neg Hx   . Stomach  cancer Neg Hx    Social History   Socioeconomic History  . Marital status: Married    Spouse name: Not on file  . Number of children: 1  . Years of education: Not on file  . Highest education level: Not on file  Occupational History  . Occupation: [employer]  Tobacco Use  . Smoking status: Never Smoker  . Smokeless tobacco: Never Used  Vaping Use  . Vaping Use: Never used  Substance and Sexual Activity  . Alcohol use: Not Currently  . Drug use: Never  . Sexual activity: Not Currently    Partners: Male  Other Topics Concern  . Not on file  Social History Narrative   Marital status/children/pets: Married, 1 child.    Education/employment: HS   Safety:      -smoke alarm in the home:Yes     - wears seatbelt: Yes     - Feels safe in their relationships: Yes   Social Determinants of Health   Financial Resource Strain: Not on file  Food Insecurity: Not on file  Transportation Needs: Not on file  Physical Activity: Not on file  Stress: Not on file  Social Connections: Not on file    Tobacco Counseling Counseling given: Not Answered   Clinical Intake:                 Diabetic?YES  Nutrition Risk Assessment:  Has the patient had any N/V/D within the last 2 months?  {YES/NO:21197} Does the patient have any non-healing wounds?  {YES/NO:21197} Has the patient had any unintentional weight loss or weight gain?  {YES/NO:21197}  Diabetes:  Is the patient diabetic?  {YES/NO:21197} If diabetic, was a CBG obtained today?  {YES/NO:21197} Did the patient bring in their glucometer from home?  {YES/NO:21197} How often do you monitor your CBG's? ***.   Financial Strains and Diabetes Management:  Are you having any financial strains with the device, your supplies or your medication? {YES/NO:21197}.  Does the patient want to be seen by Chronic Care Management for management of their diabetes?  {YES/NO:21197} Would the patient like to be referred to  a Nutritionist or for Diabetic Management?  {YES/NO:21197}  Diabetic Exams:  Diabetic Eye Exam: Completed ***. Overdue for diabetic eye exam. Pt has been advised about the importance in completing this exam. A referral has been placed today.  Message sent to referral coordinator for scheduling purposes. Advised pt to expect a call from office referred to regarding appt.  Diabetic Foot Exam: Completed ***. Pt has been advised about the importance in completing this exam. Pt is scheduled for diabetic foot exam on ***.          Activities of Daily Living In your present state of health, do you have any difficulty performing the following activities: 08/27/2020  Hearing? N  Vision? N  Difficulty concentrating or making decisions? N  Walking or climbing stairs? N  Dressing or bathing? N  Doing errands, shopping? N  Some recent data might be hidden    Patient Care Team: Ma Hillock, DO as PCP - General (Family Medicine) Minus Breeding, MD as PCP - Cardiology (Cardiology) Gatha Mayer, MD as Consulting Physician (Gastroenterology) Marshell Garfinkel, MD as Consulting Physician (Pulmonary Disease) Jacelyn Pi, MD as Referring Physician (Endocrinology)  Indicate any recent Medical Services you may have received from other than [facility] providers in the past year (date may be approximate).     Assessment:   This is a routine wellness examination for [place].  Hearing/Vision screen No exam data present  Dietary issues and exercise activities discussed:    Goals Addressed   None    Depression Screen PHQ 2/9 Scores 09/06/2020 06/03/2018 09/06/2014  PHQ - 2 Score 0 3 0  PHQ- 9 Score - 8 -    Fall Risk Fall Risk  09/06/2020 09/06/2014  Falls in the past year? 0 No  Number falls in past yr: 0 -  Injury with Fall? 0 -    FALL RISK PREVENTION PERTAINING TO THE HOME:  Any stairs in or around the home? Yes  If so, are there any without handrails? Yes  Home free of loose throw  rugs in walkways, pet beds, electrical cords, etc? Yes  Adequate lighting in your home to reduce risk of falls? Yes   ASSISTIVE DEVICES UTILIZED TO PREVENT FALLS:  Life alert? {YES/NO:21197} Use of a cane, walker or w/c? {YES/NO:21197} Grab bars in the bathroom? {YES/NO:21197} Shower chair or bench in shower? {YES/NO:21197} Elevated toilet seat or a handicapped toilet? {YES/NO:21197}  TIMED UP AND GO:  Was the test performed? No .    Cognitive Function:  Normal cognitive status assessed by direct observation by this Nurse Health Advisor. No abnormalities found.         Immunizations Immunization History  Administered Date(s) Administered  . Fluad Quad(high Dose 65+) 02/25/2020  . Influenza Split 03/27/2011, 04/08/2012  . Influenza Whole 03/14/2009, 02/06/2010  . Influenza,inj,Quad PF,6+ Mos 01/27/2013, 05/02/2014, 04/12/2017, 03/31/2018, 03/15/2019  . PFIZER(Purple Top)SARS-COV-2 Vaccination 08/08/2019, 09/01/2019  . Pneumococcal Polysaccharide-23 03/28/2008  . Tdap 09/18/2011  . Zoster, Live 04/15/2016    TDAP status: Up to date  Flu Vaccine status: Up to date  Pneumococcal vaccine status: Up to date  Covid-19 vaccine status: Completed vaccines  Qualifies for Shingles Vaccine? Yes   Zostavax completed Yes   Shingrix Completed?: No.    Education has been provided regarding the importance of this vaccine. Patient has been advised to call insurance company to determine out of pocket expense if they have not yet received this vaccine. Advised may also receive vaccine at local pharmacy or Health Dept. Verbalized acceptance and understanding.  Screening Tests Health Maintenance  Topic Date Due  . Pneumococcal Vaccine 45-40 Years old (1 of 4 - PCV13) Never done  . Zoster Vaccines- Shingrix (1 of 2) Never done  .  URINE MICROALBUMIN  06/04/2019  . COVID-19 Vaccine (3 - Booster for Pfizer series) 02/01/2020  . OPHTHALMOLOGY EXAM  08/31/2020  . HEMOGLOBIN A1C  10/31/2020   . MAMMOGRAM  09/06/2021 (Originally 05/19/2020)  . DEXA SCAN  09/06/2021 (Originally 09/29/2019)  . COLONOSCOPY (Pts 45-64yrs Insurance coverage will need to be confirmed)  09/06/2021 (Originally 09/13/2017)  . PNA vac Low Risk Adult (1 of 2 - PCV13) 09/06/2021 (Originally 09/29/2019)  . INFLUENZA VACCINE  12/25/2020  . FOOT EXAM  05/02/2021  . TETANUS/TDAP  09/17/2021  . Hepatitis C Screening  Completed  . HPV VACCINES  Aged Out    Health Maintenance  Health Maintenance Due  Topic Date Due  . Pneumococcal Vaccine 71-72 Years old (1 of 4 - PCV13) Never done  . Zoster Vaccines- Shingrix (1 of 2) Never done  . URINE MICROALBUMIN  06/04/2019  . COVID-19 Vaccine (3 - Booster for Pfizer series) 02/01/2020  . OPHTHALMOLOGY EXAM  08/31/2020  . HEMOGLOBIN A1C  10/31/2020    {Colorectal cancer screening:2101809}  {Mammogram status:21018020}  {Bone Density status:21018021}  Lung Cancer Screening: (Low Dose CT Chest recommended if Age 25-80 years, 30 pack-year currently smoking OR have quit w/in 15years.) {DOES NOT does:27190::"does not"} qualify.   Lung Cancer Screening Referral: ***  Additional Screening:  Hepatitis C Screening: does qualify; Completed 11-25-2014  Vision Screening: Recommended annual ophthalmology exams for early detection of glaucoma and other disorders of the eye. Is the patient up to date with their annual eye exam?  {YES/NO:21197} Who is the provider or what is the name of the office in which the patient attends annual eye exams? *** If pt is not established with a provider, would they like to be referred to a provider to establish care? {YES/NO:21197}.   Dental Screening: Recommended annual dental exams for proper oral hygiene  Community Resource Referral / Chronic Care Management: CRR required this visit?  {YES/NO:21197}  CCM required this visit?  {YES/NO:21197}     Plan:     I have personally reviewed and noted the following in the patient's chart:    . Medical and social history . Use of alcohol, tobacco or illicit drugs  . Current medications and supplements including opioid prescriptions. {Opioid [phone]} . Functional ability and status . Nutritional status . Physical activity . Advanced directives . List of other physicians . Hospitalizations, surgeries, and ER visits in previous 12 months . Vitals . Screenings to include cognitive, depression, and falls . Referrals and appointments  In addition, I have reviewed and discussed with patient certain preventive protocols, quality metrics, and best practice recommendations. A written personalized care plan for preventive services as well as general preventive health recommendations were provided to patient.     Leroy Kennedy, LPN   08/02/9371   Nurse Notes: ***

## 2020-11-22 ENCOUNTER — Other Ambulatory Visit: Payer: Self-pay

## 2020-11-22 ENCOUNTER — Encounter: Payer: Medicare Other | Admitting: *Deleted

## 2020-11-22 DIAGNOSIS — Z006 Encounter for examination for normal comparison and control in clinical research program: Secondary | ICD-10-CM

## 2020-11-22 NOTE — Research (Signed)
[facility] 32 Week Visit  Patient Name [name] [name] Subject ID# [id number]  Visit Date 11/22/20  Treatment compliance: Date returned: 11/22/20 Number returned from box# [id number]: 3 Number returned from box# [id number]: 4 Number returned-total: 7 Number of Full administrations: 7 Number of partial administrations: 0 Number of non administrations: 0 Number of unknown administrations: 0 Reason for any partial or non administrations: N/A Number due to non compliance: N/A Number due to device complaint: N/A Number due to other: N/A  IP Dispensation: Date dispensed 11/22/20 Number dispensed- total: 8 Number dispensed home to patient: 7 Box #: [id number] Box #: [id number]  IP Administration in Clinic: Box #: [id number] Administration time 11:13 [x] AM [] PM in clinic Quantity Administered    [] None [] Partial [x] Full Reason for None or Partial Dose ____ Administration Site [x] Abdomen [] Arm [] Thigh Laterality [] Left  [x] Right Administered by [x] Patient [] Caregiver    Subject came into the research clinic today for their Week 32 Visit in the [employer]. Subject's concomitant medications have been reviewed and updated if applicable. There are no new AE's or SAE's to report to sponsor at this time. Subject received their IP injection subcutaneously into their right lower abdomen at 11:13, tolerated well. Subject is scheduled for their next appointment on Wednesday, October 19th, 2022 @ 11am.

## 2020-11-30 ENCOUNTER — Other Ambulatory Visit: Payer: Self-pay

## 2020-11-30 NOTE — Telephone Encounter (Signed)
This is Dr. Hochrein's pt. °

## 2020-12-01 MED ORDER — ISOSORBIDE MONONITRATE ER 30 MG PO TB24: 15.0000 mg | ORAL_TABLET | Freq: Every day | ORAL | 2 refills | Status: DC

## 2020-12-13 ENCOUNTER — Other Ambulatory Visit: Payer: Self-pay

## 2020-12-13 DIAGNOSIS — K219 Gastro-esophageal reflux disease without esophagitis: Secondary | ICD-10-CM

## 2020-12-13 MED ORDER — FAMOTIDINE 20 MG PO TABS: 20.0000 mg | ORAL_TABLET | Freq: Every day | ORAL | 0 refills | Status: DC

## 2020-12-13 MED ORDER — PANTOPRAZOLE SODIUM 40 MG PO TBEC
40.0000 mg | DELAYED_RELEASE_TABLET | Freq: Two times a day (BID) | ORAL | 0 refills | Status: DC
Start: 2020-12-13 — End: 2021-01-01

## 2020-12-13 NOTE — Telephone Encounter (Signed)
Pantoprazole and famotidine refilled as requested and note attached to call for appointment.

## 2020-12-24 ENCOUNTER — Encounter: Payer: Self-pay | Admitting: Family Medicine

## 2020-12-25 MED ORDER — FENOFIBRATE 160 MG PO TABS
160.0000 mg | ORAL_TABLET | Freq: Every day | ORAL | 0 refills | Status: DC
Start: 2020-12-25 — End: 2021-01-01

## 2021-01-01 ENCOUNTER — Other Ambulatory Visit: Payer: Self-pay

## 2021-01-01 ENCOUNTER — Encounter: Payer: Self-pay | Admitting: Family Medicine

## 2021-01-01 ENCOUNTER — Ambulatory Visit (INDEPENDENT_AMBULATORY_CARE_PROVIDER_SITE_OTHER): Payer: Medicare Other | Admitting: Family Medicine

## 2021-01-01 VITALS — BP 109/63 | HR 70 | Temp 98.0°F | Ht 65.0 in | Wt 184.0 lb

## 2021-01-01 DIAGNOSIS — D6869 Other thrombophilia: Secondary | ICD-10-CM

## 2021-01-01 DIAGNOSIS — E785 Hyperlipidemia, unspecified: Secondary | ICD-10-CM | POA: Diagnosis not present

## 2021-01-01 DIAGNOSIS — I4891 Unspecified atrial fibrillation: Secondary | ICD-10-CM

## 2021-01-01 DIAGNOSIS — Z789 Other specified health status: Secondary | ICD-10-CM

## 2021-01-01 DIAGNOSIS — Z9861 Coronary angioplasty status: Secondary | ICD-10-CM

## 2021-01-01 DIAGNOSIS — I739 Peripheral vascular disease, unspecified: Secondary | ICD-10-CM

## 2021-01-01 DIAGNOSIS — I10 Essential (primary) hypertension: Secondary | ICD-10-CM

## 2021-01-01 DIAGNOSIS — E7800 Pure hypercholesterolemia, unspecified: Secondary | ICD-10-CM

## 2021-01-01 DIAGNOSIS — I200 Unstable angina: Secondary | ICD-10-CM

## 2021-01-01 DIAGNOSIS — E1169 Type 2 diabetes mellitus with other specified complication: Secondary | ICD-10-CM | POA: Diagnosis not present

## 2021-01-01 DIAGNOSIS — E038 Other specified hypothyroidism: Secondary | ICD-10-CM

## 2021-01-01 DIAGNOSIS — I2510 Atherosclerotic heart disease of native coronary artery without angina pectoris: Secondary | ICD-10-CM

## 2021-01-01 DIAGNOSIS — K760 Fatty (change of) liver, not elsewhere classified: Secondary | ICD-10-CM | POA: Diagnosis not present

## 2021-01-01 MED ORDER — FENOFIBRATE 160 MG PO TABS: 160.0000 mg | ORAL_TABLET | Freq: Every day | ORAL | 4 refills | Status: DC

## 2021-01-01 NOTE — Patient Instructions (Signed)
Great to see you today.  I have refilled the medication(s) we provide.   If labs were collected, we will inform you of lab results once received either by echart message or telephone call.   - echart message- for normal results that have been seen by the patient already.   - telephone call: abnormal results or if patient has not viewed results in their echart.  

## 2021-01-01 NOTE — Progress Notes (Signed)
[name] [name] , 1956-03-02, 65 y.o., female MRN: [id number] Patient Care Team    Relationship Specialty Notifications Start End  Ma Hillock, DO PCP - General Family Medicine  06/03/18   Minus Breeding, MD PCP - Cardiology Cardiology Admissions 11/14/17   Gatha Mayer, MD Consulting Physician Gastroenterology  06/05/18   Marshell Garfinkel, MD Consulting Physician Pulmonary Disease  06/05/18   Jacelyn Pi, MD Referring Physician Endocrinology  09/07/20     Chief Complaint  Patient presents with   Hypertension    [place]; pt is not fasting; last a1c 8.0 June 13th      Subjective:  [name] [name]  is a 65 y.o. female presents for [facility] CAD S/P percutaneous coronary angioplasty/Hyperlipidemia LDL goal <70/Essential hypertension/unstable angina/A.fib/acquired thrombophilia Pt reports compliance with Maxide, metoprolol, Cardizem, fenofibrate. Blood pressures ranges at home within normal limits. Patient denies chest pain, shortness of breath or lower extremity edema. Pt does take a daily baby ASA. Pt is unable to tolerate statins.  Per cardiology note she also cannot take PCSK9 inhibitor secondary to side effect.  She was unable to tolerate WelChol. Labs updated today. RF: Hypertension, hyperlipidemia, diabetes, obesity, CAD She has a history of cardiac cath 11/04/2017 showed 60% LAD, FFR was significant and she underwent PCI with DES.  She was treated with Brilinta which was discontinued June 2020.  Hypothyroidism Patient reports compliance with levothyroxine 88 mcg daily x6 days a week and 176 mcg 1 day a week.   insulin dependent type 2 diabetes mellitus ([place]) Condition is managed by endocrinology.A1c has been increasing per pt to 8.0 and she has noticed weight gain. She is now prescribed 70/30 insulin.    GERD/ Encounter for monitoring long-term proton pump inhibitor therapy Patient is prescribed Protonix and Pepcid by gastroenterology.   ECHOCARDIOGRAM COMPLETE  Result  Date: 08/27/2020    ECHOCARDIOGRAM REPORT   Patient Name:   [name] [name] Date of IMPRESSIONS  1. Left ventricular ejection fraction, by estimation, is 65 to 70%. The left ventricle has normal function. The left ventricle has no regional wall motion abnormalities. There is mild left ventricular hypertrophy. Left ventricular diastolic parameters are consistent with Grade I diastolic dysfunction (impaired relaxation).  2. Right ventricular systolic function is normal. The right ventricular size is normal.  3. The mitral valve is normal in structure. Trivial mitral valve regurgitation. No evidence of mitral stenosis.  4. The aortic valve is normal in structure. Aortic valve regurgitation is not visualized. No aortic stenosis is present.     No results for input(s): HGB, HCT, WBC, PLT in the last 168 hours.  CMP Latest Ref Rng & Units 09/06/2020 08/27/2020 08/26/2020  Glucose 70 - 99 mg/dL 138(H) 188(H) 177(H)  BUN 6 - 23 mg/dL '12 15 19  '$ Creatinine 0.40 - 1.20 mg/dL 1.04 1.28(H) 1.24(H)  Sodium 135 - 145 mEq/L 140 140 137  Potassium 3.5 - 5.1 mEq/L 4.1 3.8 3.6  Chloride 96 - 112 mEq/L 103 107 102  CO2 19 - 32 mEq/L '27 26 23  '$ Calcium 8.4 - 10.5 mg/dL 9.7 9.0 9.4  Total Protein 6.0 - 8.3 g/dL 7.0 6.1(L) -  Total Bilirubin 0.2 - 1.2 mg/dL 0.4 0.5 -  Alkaline Phos 39 - 117 U/L 62 58 -  AST 0 - 37 U/L 18 19 -  ALT 0 - 35 U/L 22 23 -    Depression screen [facility] 2/9 09/06/2020 06/03/2018  Decreased Interest 0 2  Down, Depressed, Hopeless  0 1  PHQ - 2 Score 0 3  Altered sleeping - 0  Tired, decreased energy - 2  Change in appetite - 3  Feeling bad or failure about yourself  - 0  Trouble concentrating - 0  Moving slowly or fidgety/restless - 0  Suicidal thoughts - 0  PHQ-9 Score - 8  Some recent data might be hidden    Allergies  Allergen Reactions   Epinephrine Other (See Comments)    Pass out    Other     Other reaction(s): Myalgia   Statins Other (See Comments)    Myalgias: Simvastatin also  caused back ache   Welchol [Colesevelam Hcl]     myalgia   Amoxicillin Itching and Rash    Has patient had a PCN reaction causing immediate rash, facial/tongue/throat swelling, SOB or lightheadedness with hypotension: No Has patient had a PCN reaction causing severe rash involving mucus membranes or skin necrosis: No Has patient had a PCN reaction that required hospitalization: No Has patient had a PCN reaction occurring within the last 10 years: Yes If all of the above answers are "NO", then may proceed with Cephalosporin use.    Social History   Tobacco Use   Smoking status: Never   Smokeless tobacco: Never  Substance Use Topics   Alcohol use: Not Currently   Past Medical History:  Diagnosis Date   Accelerating angina ([place]) 11/04/2017   Asthma    Chronic pain disorder    neck and shoulder   Chronic pain of right upper extremity 11/10/2017   CTS (carpal tunnel syndrome) 01/27/2013   right   Diabetes mellitus type II    Diabetic gastroparesis (HCC)    Esophageal reflux 08/25/2013   [place], Dr Barth Kirks   FATTY LIVER DISEASE 04/27/2007   Qualifier: Diagnosis of  By: Danelle Earthly CMA, Darlene     Focal muscle atrophy 09/26/2015   Fundic gland polyps of stomach, benign    Globus sensation 04/13/2018   HTN (hypertension)    Hyperlipidemia, mixed 05/04/2007   Qualifier: Diagnosis of  By: Wynona Luna crestor caused myalgias even at low dose Livalo caused myalgias Lipitor  Mother with severe reaction myalgias Simvastatin, Welchol    Hypertension 08/24/2010   Hypokalemia 05/25/2013   Improved stopping HCTZ. Was noted to have an elevated glucose when K was low. Recheck renal next week after starting Maxzide   Hypomagnesemia 08/10/2014   Hypothyroidism    Iron deficiency anemia 08/07/2018   Iron malabsorption 08/07/2018   Laryngitis 08/25/2017   Leg cramps 04/15/2016   Nausea without vomiting 08/08/2014   NONSPEC ELEVATION OF LEVELS OF TRANSAMINASE/LDH 05/01/2007   Qualifier:  Diagnosis of  By: Garner Gavel     Osteoarthritis    chronic, right knee (11/04/2017)   OVARIAN CYST 07/11/2009   Qualifier: Diagnosis of  By: Wynona Luna    Ovarian cyst    Plantar fasciitis    Rosacea 10/22/2016   Urine incontinence    Past Surgical History:  Procedure Laterality Date   AUGMENTATION MAMMAPLASTY Bilateral 2002   BLADDER SUSPENSION  1981   COLONOSCOPY     CORONARY STENT INTERVENTION N/A 11/04/2017   Procedure: CORONARY STENT INTERVENTION;  Surgeon: Nelva Bush, MD;  Location: [place] [facility];  Service: Cardiovascular;  Laterality: N/A;   ENDOVENOUS ABLATION SAPHENOUS VEIN W/ LASER Left 12/29/2019   endovenous laser ablation left greater saphenous vein by Ruta Hinds MD    ESOPHAGOGASTRODUODENOSCOPY     INTRAVASCULAR  PRESSURE WIRE/FFR STUDY N/A 11/04/2017   Procedure: INTRAVASCULAR PRESSURE WIRE/FFR STUDY;  Surgeon: Nelva Bush, MD;  Location: [place] [facility];  Service: Cardiovascular;  Laterality: N/A;   INTRAVASCULAR ULTRASOUND/IVUS N/A 11/04/2017   Procedure: Intravascular Ultrasound/IVUS;  Surgeon: Nelva Bush, MD;  Location: [place] [facility];  Service: Cardiovascular;  Laterality: N/A;   KNEE ARTHROSCOPY  1998, [id number]   LAPAROSCOPIC CHOLECYSTECTOMY  2007   LEFT HEART CATH AND CORONARY ANGIOGRAPHY N/A 11/04/2017   Procedure: LEFT HEART CATH AND CORONARY ANGIOGRAPHY;  Surgeon: Nelva Bush, MD;  Location: [place] [facility];  Service: Cardiovascular;  Laterality: N/A;   LEFT HEART CATHETERIZATION WITH CORONARY ANGIOGRAM N/A 11/21/2011   Procedure: LEFT HEART CATHETERIZATION WITH CORONARY ANGIOGRAM;  Surgeon: Josue Hector, MD;  Location: [facility];  Service: Cardiovascular;  Laterality: N/A;   [place]   "still have my ovaries"   Family History  Problem Relation Age of Onset   Alzheimer's disease Mother    Diabetes type II Mother    Hiatal hernia Mother    Diabetes Mother    Emphysema  Father    COPD Father    Depression Sister        suicide   Diabetes Sister    Diabetes Daughter    Colon cancer Neg Hx    Stomach cancer Neg Hx    Allergies as of 01/01/2021       Reactions   Epinephrine Other (See Comments)   Pass out    Other    Other reaction(s): Myalgia   Statins Other (See Comments)   Myalgias: Simvastatin also caused back ache   Welchol [colesevelam Hcl]    myalgia   Amoxicillin Itching, Rash   Has patient had a PCN reaction causing immediate rash, facial/tongue/throat swelling, SOB or lightheadedness with hypotension: No Has patient had a PCN reaction causing severe rash involving mucus membranes or skin necrosis: No Has patient had a PCN reaction that required hospitalization: No Has patient had a PCN reaction occurring within the last 10 years: Yes If all of the above answers are "NO", then may proceed with Cephalosporin use.        Medication List        Accurate as of January 01, 2021  2:39 PM. If you have any questions, ask your nurse or doctor.          STOP taking these medications    acarbose 25 MG tablet Commonly known as: PRECOSE Stopped by: Howard Pouch, DO   Jardiance 25 MG Tabs tablet Generic drug: empagliflozin Stopped by: Howard Pouch, DO   pantoprazole 40 MG tablet Commonly known as: PROTONIX Stopped by: Howard Pouch, DO   pioglitazone 30 MG tablet Commonly known as: ACTOS Stopped by: Howard Pouch, DO   Tresiba FlexTouch 100 UNIT/ML FlexTouch Pen Generic drug: insulin degludec Stopped by: Howard Pouch, DO       TAKE these medications    Accu-Chek Guide test strip Generic drug: glucose blood Use 4 times daily as needed or directed.   Accu-Chek Softclix Lancets lancets Use 4 times daily as needed or directed   apixaban 5 MG Tabs tablet Commonly known as: ELIQUIS Take 1 tablet (5 mg total) by mouth 2 (two) times daily.   aspirin EC 81 MG tablet Take 81 mg by mouth daily. Swallow whole.   CALTRATE 600  PO Take 600 mg by mouth 2 (two) times daily.   colesevelam 625 MG tablet  Commonly known as: Welchol Take 2 tablets (1,250 mg total) by mouth daily.   Endur-Acin 250 MG Tbcr Generic drug: Niacin CR 1 tablet with food   famotidine 20 MG tablet Commonly known as: PEPCID Take 1 tablet (20 mg total) by mouth at bedtime.   fenofibrate 160 MG tablet Take 1 tablet (160 mg total) by mouth daily.   isosorbide mononitrate 30 MG 24 hr tablet Commonly known as: IMDUR Take 0.5 tablets (15 mg total) by mouth daily.   levothyroxine 112 MCG tablet Commonly known as: SYNTHROID Take 1 tablet (112 mcg total) by mouth daily before breakfast. Take one tablet daily on an empty stomach.   Magnesium 250 MG Tabs Take 1 tablet by mouth daily.   metFORMIN 500 MG tablet Commonly known as: GLUCOPHAGE Take 500 mg by mouth 2 (two) times daily with a meal.   metoprolol tartrate 25 MG tablet Commonly known as: LOPRESSOR Take 1 tablet (25 mg total) by mouth 2 (two) times daily. Take extra dose for break though Afib with increased HR >100   nitroGLYCERIN 0.4 MG SL tablet Commonly known as: NITROSTAT Place 1 tablet (0.4 mg total) under the tongue every 5 (five) minutes as needed for chest pain.   NovoLIN 70/30 ReliOn (70-30) 100 UNIT/ML injection Generic drug: insulin NPH-regular Human Inject into the skin. 20 IU am 40 IU pm   POTASSIUM CITRATE PO Take 99 mg by mouth daily.   PROBIOTIC DAILY PO Take 99 mg by mouth daily.   RELION INSULIN SYR 0.5ML/31G 31G X 5/16" 0.5 ML Misc Generic drug: Insulin Syringe-Needle U-100 2 (two) times daily. as directed   ReliOn Pen Needles 32G X 4 MM Misc Generic drug: Insulin Pen Needle USE AS DIRECTED ONCE DAILY IN THE EVENING FOR 30 DAYS   repaglinide 2 MG tablet Commonly known as: PRANDIN Take 2 mg by mouth in the morning and at bedtime.   Repatha SureClick [id number] MG/ML Soaj Generic drug: Evolocumab Inject 1 Dose into the skin every 14 (fourteen) days.         All past medical history, surgical history, allergies, family history, immunizations and medications were updated in the EMR today and reviewed under the history and medication portions of their EMR.      ROS: Negative, with the exception of above mentioned in HPI   Objective:  BP 109/63   Pulse 70   Temp 98 F (36.7 C) (Oral)   Ht '5\' 5"'$  (1.651 m)   Wt 184 lb (83.5 kg)   SpO2 97%   BMI 30.62 kg/m  Body mass index is 30.62 kg/m. Gen: Afebrile. No acute distress. Nontoxic. Obese pleasant female.  HENT: AT. [place].  Eyes:Pupils Equal Round Reactive to light, Extraocular movements intact,  Conjunctiva without redness, discharge or icterus. Neck/lymp/endocrine: Supple,no lymphadenopathy, no thyromegaly CV: RRR no murmur, no edema, +2/4 P posterior tibialis pulses Chest: CTAB, no wheeze or crackles Skin: no rashes, purpura or petechiae.  Neuro:  Normal gait. PERLA. EOMi. Alert. Oriented x3 Psych: Normal affect, dress and demeanor. Normal speech. Normal thought content and judgment.    Assessment/Plan: [name] [name] is a 64 y.o. female present for  Acquired hypothyroidism Currently compliant with levothyroxine 112 mcg qd  Labs UTD  CAD S/P percutaneous coronary angioplasty/Hyperlipidemia LDL goal <70/Essential hypertension/unstable angina/A.fib/acquired thrombophilia stable Continue metoprolol 25 mg twice daily Continue Eliquis 5 mg twice daily Continue IMDUR 15 mg daily Continue/restart ASA 81 mg daily Medications Supplied by cardiology  Hyperlipidemia with DM: Continue welchol> prescribed  by endocrinology Continue Repatha> prescribed by cardio Continue fenofibrate > refilled today  Chronic anticoagulation-acquired thrombophilia Stable  Continue Eliquis.   Continue ASA 81 mg  Reviewed expectations re: course of current medical issues. Discussed self-management of symptoms. Outlined signs and symptoms indicating need for more acute intervention. Patient  verbalized understanding and all questions were answered. Patient received an After-Visit Summary. Any changes in medications were reviewed and patient was provided with updated med list with their AVS.     No orders of the defined types were placed in this encounter. Meds ordered this encounter  Medications   fenofibrate 160 MG tablet    Sig: Take 1 tablet (160 mg total) by mouth daily.    Dispense:  90 tablet    Refill:  4      Note is dictated utilizing voice recognition software. Although note has been proof read prior to signing, occasional typographical errors still can be missed. If any questions arise, please do not hesitate to call for verification.   electronically signed by:  Howard Pouch, DO  [place]

## 2021-01-08 NOTE — Progress Notes (Signed)
Cardiology Office Note   Date:  01/09/2021   ID:  [name] [name], [name] [name] November 15, 1955, MRN [id number]  PCP:  Ma Hillock, DO  Cardiologist:   Minus Breeding, MD   Chief Complaint  Patient presents with   Palpitations       History of Present Illness: [name] [name] is a 65 y.o. female who presents for follow up of coronary artery disease status post DES to ostial/proximal LAD in 6/19 with occlusion of D1 branch    She had a low risk POET (Plain Old Exercise Treadmill) in July 2021.   She presented to the [facility] on 08/26/2020 for evaluation of chest pain.  Echocardiogram 08/27/2020 showed LVEF 65-70% .  Nuclear stress test 08/27/2020 showed no reversible ischemia or infarct, EF 76% and low risk.  She developed paroxysmal atrial fibrillation 08/27/20 and was placed on IV heparin and diltiazem gtt. She converted to sinus rhythm after 2 hours.  CHA2DS2-VASc score 5, TSH 19.14 with free T4 of 0.80.  She was symptomatic and reported jaw tingling and chest pressure with the episode.  A cardiac event monitor was ordered and showed predominantly normal sinus rhythm, sinus bradycardia, sinus tachycardia, isolated SVE's, no atrial flutter atrial fibrillation or pauses noted.  Since she was last seen she is had frequent episodes of tachypalpitations.  However, these only last for about 10 seconds.  She feels it about daily.  She has not had any presyncope or syncope.  She has had no further shortness of breath that was her previous angina.  She is not having any chest pressure, neck or arm discomfort.  She is having weight gain because she is not exercising as much because she just tired after work.     Past Medical History:  Diagnosis Date   Accelerating angina ([place]) 11/04/2017   Asthma    Chronic pain disorder    neck and shoulder   Chronic pain of right upper extremity 11/10/2017   CTS (carpal tunnel syndrome) 01/27/2013   right   Diabetes mellitus type II    Diabetic gastroparesis  (HCC)    Esophageal reflux 08/25/2013   [place], Dr Barth Kirks   FATTY LIVER DISEASE 04/27/2007   Qualifier: Diagnosis of  By: Danelle Earthly CMA, Darlene     Focal muscle atrophy 09/26/2015   Fundic gland polyps of stomach, benign    Globus sensation 04/13/2018   HTN (hypertension)    Hyperlipidemia, mixed 05/04/2007   Qualifier: Diagnosis of  By: Wynona Luna crestor caused myalgias even at low dose Livalo caused myalgias Lipitor  Mother with severe reaction myalgias Simvastatin, Welchol    Hypertension 08/24/2010   Hypokalemia 05/25/2013   Improved stopping HCTZ. Was noted to have an elevated glucose when K was low. Recheck renal next week after starting Maxzide   Hypomagnesemia 08/10/2014   Hypothyroidism    Iron deficiency anemia 08/07/2018   Iron malabsorption 08/07/2018   Laryngitis 08/25/2017   Leg cramps 04/15/2016   Nausea without vomiting 08/08/2014   NONSPEC ELEVATION OF LEVELS OF TRANSAMINASE/LDH 05/01/2007   Qualifier: Diagnosis of  By: Garner Gavel     Osteoarthritis    chronic, right knee (11/04/2017)   OVARIAN CYST 07/11/2009   Qualifier: Diagnosis of  By: Wynona Luna    Ovarian cyst    Plantar fasciitis    Rosacea 10/22/2016   Urine incontinence     Past Surgical History:  Procedure Laterality Date   AUGMENTATION MAMMAPLASTY  Bilateral 2002   BLADDER SUSPENSION  1981   COLONOSCOPY     CORONARY STENT INTERVENTION N/A 11/04/2017   Procedure: CORONARY STENT INTERVENTION;  Surgeon: Nelva Bush, MD;  Location: [place] [facility];  Service: Cardiovascular;  Laterality: N/A;   ENDOVENOUS ABLATION SAPHENOUS VEIN W/ LASER Left 12/29/2019   endovenous laser ablation left greater saphenous vein by Ruta Hinds MD    ESOPHAGOGASTRODUODENOSCOPY     INTRAVASCULAR PRESSURE WIRE/FFR STUDY N/A 11/04/2017   Procedure: INTRAVASCULAR PRESSURE WIRE/FFR STUDY;  Surgeon: Nelva Bush, MD;  Location: [place] [facility];  Service: Cardiovascular;  Laterality: N/A;    INTRAVASCULAR ULTRASOUND/IVUS N/A 11/04/2017   Procedure: Intravascular Ultrasound/IVUS;  Surgeon: Nelva Bush, MD;  Location: [place] [facility];  Service: Cardiovascular;  Laterality: N/A;   KNEE ARTHROSCOPY  1998, [id number]   LAPAROSCOPIC CHOLECYSTECTOMY  2007   LEFT HEART CATH AND CORONARY ANGIOGRAPHY N/A 11/04/2017   Procedure: LEFT HEART CATH AND CORONARY ANGIOGRAPHY;  Surgeon: Nelva Bush, MD;  Location: [place] [facility];  Service: Cardiovascular;  Laterality: N/A;   LEFT HEART CATHETERIZATION WITH CORONARY ANGIOGRAM N/A 11/21/2011   Procedure: LEFT HEART CATHETERIZATION WITH CORONARY ANGIOGRAM;  Surgeon: Josue Hector, MD;  [facility];  Service: Cardiovascular;  Laterality: N/A;   [place]   "still have my ovaries"     Current Outpatient Medications  Medication Sig Dispense Refill   Accu-Chek Softclix Lancets lancets Use 4 times daily as needed or directed 360 each 3   apixaban (ELIQUIS) 5 MG TABS tablet Take 1 tablet (5 mg total) by mouth 2 (two) times daily. 180 tablet 1   aspirin EC 81 MG tablet Take 81 mg by mouth daily. Swallow whole.     Calcium Carbonate (CALTRATE 600 PO) Take 600 mg by mouth 2 (two) times daily.      colesevelam (WELCHOL) 625 MG tablet Take 2 tablets (1,250 mg total) by mouth daily. 180 tablet 3   Evolocumab (REPATHA SURECLICK) [id number] MG/ML SOAJ Inject 1 Dose into the skin every 14 (fourteen) days. 2 mL 11   famotidine (PEPCID) 20 MG tablet Take 1 tablet (20 mg total) by mouth at bedtime. 90 tablet 0   fenofibrate 160 MG tablet Take 1 tablet (160 mg total) by mouth daily. 90 tablet 4   glucose blood (ACCU-CHEK GUIDE) test strip Use 4 times daily as needed or directed. 100 each 3   isosorbide mononitrate (IMDUR) 30 MG 24 hr tablet Take 0.5 tablets (15 mg total) by mouth daily. 45 tablet 2   levothyroxine (SYNTHROID) 112 MCG tablet Take 1 tablet (112 mcg total) by mouth daily before breakfast. Take one  tablet daily on an empty stomach. 90 tablet 4   Magnesium 250 MG TABS Take 1 tablet by mouth daily.     metFORMIN (GLUCOPHAGE) 500 MG tablet Take 500 mg by mouth 2 (two) times daily with a meal.     metoprolol tartrate (LOPRESSOR) 25 MG tablet Take 1 tablet (25 mg total) by mouth 2 (two) times daily. Take extra dose for break though Afib with increased HR >100 180 tablet 3   Niacin CR 250 MG TBCR 1 tablet with food     nitroGLYCERIN (NITROSTAT) 0.4 MG SL tablet Place 1 tablet (0.4 mg total) under the tongue every 5 (five) minutes as needed for chest pain. 50 tablet 3   NOVOLIN 70/30 RELION (70-30) 100 UNIT/ML injection Inject into the skin. 20 IU am 40  IU pm     POTASSIUM CITRATE PO Take 99 mg by mouth daily.      Probiotic Product (PROBIOTIC DAILY PO) Take 99 mg by mouth daily.      RELION INSULIN SYR 0.5ML/31G 31G X 5/16" 0.5 ML MISC 2 (two) times daily. as directed     RELION PEN NEEDLES 32G X 4 MM MISC USE AS DIRECTED ONCE DAILY IN THE EVENING FOR 30 DAYS     repaglinide (PRANDIN) 2 MG tablet Take 2 mg by mouth in the morning and at bedtime.     No current facility-administered medications for this visit.    Allergies:   Epinephrine, Other, Statins, Welchol [colesevelam hcl], and Amoxicillin    ROS:  Please see the history of present illness.   Otherwise, review of systems are positive for none.   All other systems are reviewed and negative.    PHYSICAL EXAM: VS:  BP 120/72   Pulse 84   Ht 5' 5.5" (1.664 m)   Wt 185 lb (83.9 kg)   SpO2 96%   BMI 30.32 kg/m  , BMI Body mass index is 30.32 kg/m. GENERAL:  Well appearing NECK:  No jugular venous distention, waveform within normal limits, carotid upstroke brisk and symmetric, no bruits, no thyromegaly LUNGS:  Clear to auscultation bilaterally CHEST:  Unremarkable HEART:  PMI not displaced or sustained,S1 and S2 within normal limits, no S3, no S4, no clicks, no rubs, no murmurs ABD:  Flat, positive bowel sounds normal in frequency  in pitch, no bruits, no rebound, no guarding, no midline pulsatile mass, no hepatomegaly, no splenomegaly EXT:  2 plus pulses throughout, no edema, no cyanosis no clubbing  EKG:  EKG is ordered today. The ekg ordered today demonstrates sinus rhythm with possible rate 84, axis within normal limits, intervals within normal limits, no acute ST-T wave changes.   Recent Labs: 02/25/2020: Magnesium 1.6 09/06/2020: ALT 22; BUN 12; Creatinine, Ser 1.04; Hemoglobin 12.7; Platelets 242.0; Potassium 4.1; Sodium 140 10/20/2020: TSH 0.49    Lipid Panel    Component Value Date/Time   CHOL 210 (H) 08/27/2020 0119   CHOL 228 (H) 03/31/2020 1300   TRIG 253 (H) 08/27/2020 0119   HDL 38 (L) 08/27/2020 0119   HDL 47 03/31/2020 1300   CHOLHDL 5.5 08/27/2020 0119   VLDL 51 (H) 08/27/2020 0119   LDLCALC 121 (H) 08/27/2020 0119   LDLCALC 119 (H) 03/31/2020 1300   LDLDIRECT 151.0 06/01/2015 0812      Wt Readings from Last 3 Encounters:  01/09/21 185 lb (83.9 kg)  01/01/21 184 lb (83.5 kg)  09/29/20 179 lb 9.6 oz (81.5 kg)      Other studies Reviewed: Additional studies/ records that were reviewed today include: None. Review of the above records demonstrates:  Please see elsewhere in the note.     ASSESSMENT AND PLAN:    Coronary artery disease:  The patient has no new sypmtoms.  No further cardiovascular testing is indicated.  We will continue with aggressive risk reduction and meds as listed.  Paroxysmal atrial fibrillation:   She is having very brief episodes of this and does not want any further therapy.  She tolerates anticoagulation.  No change in therapy.   Essential hypertension: The blood pressure is well controlled.  No change in therapy.    Current medicines are reviewed at length with the patient today.  The patient does not have concerns regarding medicines.  The following changes have been made:  no change  Labs/ tests ordered today include: None  Orders Placed This  Encounter  Procedures   EKG 12-Lead      Disposition:   FU with me in six months.     Signed, Minus Breeding, MD  01/09/2021 5:57 PM    [place]

## 2021-01-09 ENCOUNTER — Other Ambulatory Visit: Payer: Self-pay

## 2021-01-09 ENCOUNTER — Ambulatory Visit (INDEPENDENT_AMBULATORY_CARE_PROVIDER_SITE_OTHER): Payer: Medicare Other | Admitting: Cardiology

## 2021-01-09 ENCOUNTER — Encounter: Payer: Self-pay | Admitting: Cardiology

## 2021-01-09 VITALS — BP 120/72 | HR 84 | Ht 65.5 in | Wt 185.0 lb

## 2021-01-09 DIAGNOSIS — E785 Hyperlipidemia, unspecified: Secondary | ICD-10-CM | POA: Diagnosis not present

## 2021-01-09 DIAGNOSIS — I10 Essential (primary) hypertension: Secondary | ICD-10-CM | POA: Diagnosis not present

## 2021-01-09 DIAGNOSIS — I2510 Atherosclerotic heart disease of native coronary artery without angina pectoris: Secondary | ICD-10-CM

## 2021-01-09 DIAGNOSIS — I200 Unstable angina: Secondary | ICD-10-CM | POA: Diagnosis not present

## 2021-01-09 DIAGNOSIS — E118 Type 2 diabetes mellitus with unspecified complications: Secondary | ICD-10-CM

## 2021-01-09 DIAGNOSIS — I480 Paroxysmal atrial fibrillation: Secondary | ICD-10-CM

## 2021-01-09 NOTE — Patient Instructions (Signed)
Medication Instructions:  No Changes In Medications at this time.  *If you need a refill on your cardiac medications before your next appointment, please call your pharmacy*  Follow-Up: At [facility], you and your health needs are our priority.  As part of our continuing mission to provide you with exceptional heart care, we have created designated Provider Care Teams.  These Care Teams include your primary Cardiologist (physician) and Advanced Practice Providers (APPs -  Physician Assistants and Nurse Practitioners) who all work together to provide you with the care you need, when you need it.  Your next appointment:   6 month(s)  The format for your next appointment:   In Person  Provider:   Minus Breeding, MD

## 2021-01-30 ENCOUNTER — Telehealth: Payer: Self-pay

## 2021-01-30 NOTE — Telephone Encounter (Signed)
Spoke with patient notifying her that Rx was sent on 01/01/21 (90,4) pharmacy should have on file. Pt will call pharmacy to verify and if any complications with getting filled will have pharmacy contact office.

## 2021-01-30 NOTE — Telephone Encounter (Signed)
Patient refill request.  fenofibrate 160 MG tablet [id number]   [facility] [facility] [place]

## 2021-03-06 ENCOUNTER — Telehealth: Payer: Self-pay | Admitting: Family Medicine

## 2021-03-06 ENCOUNTER — Encounter: Payer: Self-pay | Admitting: Hematology & Oncology

## 2021-03-06 NOTE — Telephone Encounter (Signed)
Pt advised she had the old shingles vaccine, zoster. The new shingles series, shingrix is 2 part and can be completed at the pharmacy due to her insurance. Advised she could get them at the same time or wait 2 weeks in between. She is scheduled for covid booster next week and will follow up with pharmacy regarding any other concerns receiving flu/shingles vaccine. She voiced understanding.

## 2021-03-06 NOTE — Telephone Encounter (Signed)
Caller Name: Alayha Call back phone #: [phone]  Reason for Call: pt called asking is she can get shingles shot and flu shot together. Pt also said it's been a long time since first shingles and asking if ok/due for another.   Please call back.

## 2021-03-09 ENCOUNTER — Encounter: Payer: Self-pay | Admitting: Podiatry

## 2021-03-09 ENCOUNTER — Ambulatory Visit (INDEPENDENT_AMBULATORY_CARE_PROVIDER_SITE_OTHER): Payer: Medicare Other | Admitting: Podiatry

## 2021-03-09 ENCOUNTER — Other Ambulatory Visit: Payer: Self-pay

## 2021-03-09 ENCOUNTER — Ambulatory Visit (INDEPENDENT_AMBULATORY_CARE_PROVIDER_SITE_OTHER): Payer: Medicare Other

## 2021-03-09 DIAGNOSIS — M7752 Other enthesopathy of left foot: Secondary | ICD-10-CM

## 2021-03-09 DIAGNOSIS — I200 Unstable angina: Secondary | ICD-10-CM | POA: Diagnosis not present

## 2021-03-09 DIAGNOSIS — M779 Enthesopathy, unspecified: Secondary | ICD-10-CM

## 2021-03-09 MED ORDER — TRIAMCINOLONE ACETONIDE 10 MG/ML IJ SUSP
10.0000 mg | Freq: Once | INTRAMUSCULAR | Status: AC
  Administered 2021-03-09: 10 mg

## 2021-03-09 NOTE — Progress Notes (Signed)
Subjective:   Patient ID: [name] [name], female   DOB: 65 y.o.   MRN: [id number]   HPI Patient presents stating she developed a lot of pain underneath her left foot and it feels like a knot and states it makes it hard at times to walk.  Does not remember specific injury does not smoke likes to be active   Review of Systems  All other systems reviewed and are negative.      Objective:  Physical Exam Vitals and nursing note reviewed.  Constitutional:      Appearance: She is well-developed.  Pulmonary:     Effort: Pulmonary effort is normal.  Musculoskeletal:        General: Normal range of motion.  Skin:    General: Skin is warm.  Neurological:     Mental Status: She is alert.    Neurovascular status intact muscle strength was found to be adequate range of motion within normal limits.  Patient is noted to have inflammation pain underneath the left foot around the fourth metatarsal phalangeal joint with fluid around the joint surface and moderate prominence of the metatarsal.  Patient has good digital perfusion well oriented x3     Assessment:  Mobility for capsulitis of the fourth MPJ left with inflammation fluid of the joint     Plan:  8 H&P and reviewed condition and recommended that we treat conservatively.  Sterile prep done anesthetized 60 mg like Marcaine mixture aspirated fourth MPJ getting out a small amount of clear fluid injected quarter cc dexamethasone Kenalog apply thick plantar padding wear rigid bottom shoes reappoint to recheck  X-rays indicate no signs of fracture or arthritis surrounding this joint surface that is inflamed

## 2021-03-13 ENCOUNTER — Other Ambulatory Visit (HOSPITAL_BASED_OUTPATIENT_CLINIC_OR_DEPARTMENT_OTHER): Payer: Self-pay

## 2021-03-13 ENCOUNTER — Ambulatory Visit: Payer: Medicare Other | Attending: Internal Medicine

## 2021-03-13 ENCOUNTER — Encounter: Payer: Self-pay | Admitting: Hematology & Oncology

## 2021-03-13 DIAGNOSIS — Z23 Encounter for immunization: Secondary | ICD-10-CM

## 2021-03-13 MED ORDER — INFLUENZA VAC A&B SA ADJ QUAD 0.5 ML IM PRSY
PREFILLED_SYRINGE | INTRAMUSCULAR | 0 refills | Status: DC
  Filled 2021-03-13 (×2): qty 0.5, 1d supply, fill #0

## 2021-03-13 NOTE — Progress Notes (Signed)
   Covid-19 Vaccination Clinic  Name:  AYSHIA GRAMLICH    MRN: [id number] DOB: [birth year]  03/13/2021  Ms. Harkey was observed post Covid-19 immunization for 15 minutes without incident. She was provided with Vaccine Information Sheet and instruction to access the V-Safe system.   Ms. Fedder was instructed to call 911 with any severe reactions post vaccine: Difficulty breathing  Swelling of face and throat  A fast heartbeat  A bad rash all over body  Dizziness and weakness

## 2021-03-14 ENCOUNTER — Other Ambulatory Visit: Payer: Self-pay

## 2021-03-14 DIAGNOSIS — Z006 Encounter for examination for normal comparison and control in clinical research program: Secondary | ICD-10-CM

## 2021-03-14 NOTE — Research (Addendum)
Patient seen in the clinic for week 77 of Vesalius study. Denies any recent events and hospitalizations. No medication changes since last visit. IP medication was given by the patient is her LUQ at 11:30 am. Patient tolerated injection without any complaint. IP medications given to patient until next visit. Next scheduled appt 07/04/21 @ 11am

## 2021-03-21 ENCOUNTER — Other Ambulatory Visit: Payer: Self-pay

## 2021-03-21 MED ORDER — APIXABAN 5 MG PO TABS: 5.0000 mg | ORAL_TABLET | Freq: Two times a day (BID) | ORAL | 1 refills | Status: DC

## 2021-03-21 NOTE — Telephone Encounter (Signed)
Prescription refill request for Eliquis received. Indication:afib Last office visit:hochrein 01/09/21 Scr:1.04 09/06/20 Age: 74f Weight:83.9kg

## 2021-03-21 NOTE — Telephone Encounter (Signed)
Prescription refill request for Eliquis received. Indication: Afib  Last office visit: 01/09/21 (Hochrein)  Scr: 1.04 (09/06/20) Age: 65 Weight: 83.9kg  Appropriate dose and refill sent to requested pharmacy.

## 2021-03-30 ENCOUNTER — Ambulatory Visit: Payer: Medicare Other | Admitting: Podiatry

## 2021-04-04 ENCOUNTER — Ambulatory Visit (INDEPENDENT_AMBULATORY_CARE_PROVIDER_SITE_OTHER): Payer: Medicare Other

## 2021-04-04 ENCOUNTER — Other Ambulatory Visit: Payer: Self-pay

## 2021-04-04 VITALS — BP 122/64 | HR 81 | Wt 183.0 lb

## 2021-04-04 DIAGNOSIS — Z0000 Encounter for general adult medical examination without abnormal findings: Secondary | ICD-10-CM

## 2021-04-04 NOTE — Patient Instructions (Addendum)
[name] [name] , Thank you for taking time to come for your Medicare Wellness Visit. I appreciate your ongoing commitment to your health goals. Please review the following plan we discussed and let me know if I can assist you in the future.   Screening recommendations/referrals: Colonoscopy: Not at this time  Mammogram: pt stated she will schedule  Bone Density: pt will schedule during mammogram  Recommended yearly ophthalmology/optometry visit for glaucoma screening and checkup Recommended yearly dental visit for hygiene and checkup  Vaccinations: Influenza vaccine: Done 03/13/21 Pneumococcal vaccine: done 03/28/2008 Tdap vaccine: Done 09/18/11 repeat every 10 years  Shingles vaccine: Shingrix discussed. Please contact your pharmacy for coverage information.    Covid-19:Completed 3/14, 09/01/19 03/13/21  Advanced directives: Please bring a copy of your health care power of attorney and living will to the office at your convenience.  Conditions/risks identified: ose weight   Next appointment: Follow up in one year for your annual wellness visit    Preventive Care 65 Years and Older, Female Preventive care refers to lifestyle choices and visits with your health care provider that can promote health and wellness. What does preventive care include? A yearly physical exam. This is also called an annual well check. Dental exams once or twice a year. Routine eye exams. Ask your health care provider how often you should have your eyes checked. Personal lifestyle choices, including: Daily care of your teeth and gums. Regular physical activity. Eating a healthy diet. Avoiding tobacco and drug use. Limiting alcohol use. Practicing safe sex. Taking low-dose aspirin every day. Taking vitamin and mineral supplements as recommended by your health care provider. What happens during an annual well check? The services and screenings done by your health care provider during your annual well check will  depend on your age, overall health, lifestyle risk factors, and family history of disease. Counseling  Your health care provider may ask you questions about your: Alcohol use. Tobacco use. Drug use. Emotional well-being. Home and relationship well-being. Sexual activity. Eating habits. History of falls. Memory and ability to understand (cognition). Work and work Statistician. Reproductive health. Screening  You may have the following tests or measurements: Height, weight, and BMI. Blood pressure. Lipid and cholesterol levels. These may be checked every 5 years, or more frequently if you are over 56 years old. Skin check. Lung cancer screening. You may have this screening every year starting at age 45 if you have a 30-pack-year history of smoking and currently smoke or have quit within the past 15 years. Fecal occult blood test (FOBT) of the stool. You may have this test every year starting at age 54. Flexible sigmoidoscopy or colonoscopy. You may have a sigmoidoscopy every 5 years or a colonoscopy every 10 years starting at age 49. Hepatitis C blood test. Hepatitis B blood test. Sexually transmitted disease (STD) testing. Diabetes screening. This is done by checking your blood sugar (glucose) after you have not eaten for a while (fasting). You may have this done every 1-3 years. Bone density scan. This is done to screen for osteoporosis. You may have this done starting at age 57. Mammogram. This may be done every 1-2 years. Talk to your health care provider about how often you should have regular mammograms. Talk with your health care provider about your test results, treatment options, and if necessary, the need for more tests. Vaccines  Your health care provider may recommend certain vaccines, such as: Influenza vaccine. This is recommended every year. Tetanus, diphtheria, and acellular pertussis (Tdap,  Td) vaccine. You may need a Td booster every 10 years. Zoster vaccine. You may  need this after age 87. Pneumococcal 13-valent conjugate (PCV13) vaccine. One dose is recommended after age 64. Pneumococcal polysaccharide (PPSV23) vaccine. One dose is recommended after age 43. Talk to your health care provider about which screenings and vaccines you need and how often you need them. This information is not intended to replace advice given to you by your health care provider. Make sure you discuss any questions you have with your health care provider. Document Released: 06/09/2015 Document Revised: 01/31/2016 Document Reviewed: 03/14/2015 Elsevier Interactive Patient Education  2017 [place] Prevention in the Home Falls can cause injuries. They can happen to people of all ages. There are many things you can do to make your home safe and to help prevent falls. What can I do on the outside of my home? Regularly fix the edges of walkways and driveways and fix any cracks. Remove anything that might make you trip as you walk through a door, such as a raised step or threshold. Trim any bushes or trees on the path to your home. Use bright outdoor lighting. Clear any walking paths of anything that might make someone trip, such as rocks or tools. Regularly check to see if handrails are loose or broken. Make sure that both sides of any steps have handrails. Any raised decks and porches should have guardrails on the edges. Have any leaves, snow, or ice cleared regularly. Use sand or salt on walking paths during winter. Clean up any spills in your garage right away. This includes oil or grease spills. What can I do in the bathroom? Use night lights. Install grab bars by the toilet and in the tub and shower. Do not use towel bars as grab bars. Use non-skid mats or decals in the tub or shower. If you need to sit down in the shower, use a plastic, non-slip stool. Keep the floor dry. Clean up any water that spills on the floor as soon as it happens. Remove soap buildup in  the tub or shower regularly. Attach bath mats securely with double-sided non-slip rug tape. Do not have throw rugs and other things on the floor that can make you trip. What can I do in the bedroom? Use night lights. Make sure that you have a light by your bed that is easy to reach. Do not use any sheets or blankets that are too big for your bed. They should not hang down onto the floor. Have a firm chair that has side arms. You can use this for support while you get dressed. Do not have throw rugs and other things on the floor that can make you trip. What can I do in the kitchen? Clean up any spills right away. Avoid walking on wet floors. Keep items that you use a lot in easy-to-reach places. If you need to reach something above you, use a strong step stool that has a grab bar. Keep electrical cords out of the way. Do not use floor polish or wax that makes floors slippery. If you must use wax, use non-skid floor wax. Do not have throw rugs and other things on the floor that can make you trip. What can I do with my stairs? Do not leave any items on the stairs. Make sure that there are handrails on both sides of the stairs and use them. Fix handrails that are broken or loose. Make sure that handrails are as  long as the stairways. Check any carpeting to make sure that it is firmly attached to the stairs. Fix any carpet that is loose or worn. Avoid having throw rugs at the top or bottom of the stairs. If you do have throw rugs, attach them to the floor with carpet tape. Make sure that you have a light switch at the top of the stairs and the bottom of the stairs. If you do not have them, ask someone to add them for you. What else can I do to help prevent falls? Wear shoes that: Do not have high heels. Have rubber bottoms. Are comfortable and fit you well. Are closed at the toe. Do not wear sandals. If you use a stepladder: Make sure that it is fully opened. Do not climb a closed  stepladder. Make sure that both sides of the stepladder are locked into place. Ask someone to hold it for you, if possible. Clearly mark and make sure that you can see: Any grab bars or handrails. First and last steps. Where the edge of each step is. Use tools that help you move around (mobility aids) if they are needed. These include: Canes. Walkers. Scooters. Crutches. Turn on the lights when you go into a dark area. Replace any light bulbs as soon as they burn out. Set up your furniture so you have a clear path. Avoid moving your furniture around. If any of your floors are uneven, fix them. If there are any pets around you, be aware of where they are. Review your medicines with your doctor. Some medicines can make you feel dizzy. This can increase your chance of falling. Ask your doctor what other things that you can do to help prevent falls. This information is not intended to replace advice given to you by your health care provider. Make sure you discuss any questions you have with your health care provider. Document Released: 03/09/2009 Document Revised: 10/19/2015 Document Reviewed: 06/17/2014 Elsevier Interactive Patient Education  2017 [employer].

## 2021-04-04 NOTE — Progress Notes (Signed)
Subjective:   [name] [name] is a 65 y.o. female who presents for Medicare Annual (Subsequent) preventive examination.  Review of Systems     Cardiac Risk Factors include: advanced age (>71men, >7 women);hypertension;dyslipidemia;diabetes mellitus     Objective:    Today's Vitals   04/04/21 1136  BP: 122/64  Pulse: 81  SpO2: 98%  Weight: 183 lb (83 kg)   Body mass index is 29.99 kg/m.  Advanced Directives 04/04/2021 08/27/2020 08/26/2020 08/27/2018 06/26/2018 11/04/2017 11/04/2017  Does Patient Have a Medical Advance Directive? Yes Yes Yes Yes Yes Yes Yes  Type of Programmer, systems - Healthcare Power of [place];Living will Living will;Healthcare Power of [place];Living will [place];Living will  Does patient want to make changes to medical advance directive? - No - Patient declined - No - Guardian declined No - Patient declined No - Patient declined -  Copy of [place] in Chart? No - copy requested No - copy requested - No - copy requested No - copy requested No - copy requested No - copy requested    Current Medications (verified) Outpatient Encounter Medications as of 04/04/2021  Medication Sig   Accu-Chek Softclix Lancets lancets Use 4 times daily as needed or directed   apixaban (ELIQUIS) 5 MG TABS tablet Take 1 tablet (5 mg total) by mouth 2 (two) times daily.   aspirin EC 81 MG tablet Take 81 mg by mouth daily. Swallow whole.   Calcium Carbonate (CALTRATE 600 PO) Take 600 mg by mouth 2 (two) times daily.    colesevelam (WELCHOL) 625 MG tablet Take 2 tablets (1,250 mg total) by mouth daily.   Evolocumab (REPATHA SURECLICK) 403 MG/ML SOAJ Inject 1 Dose into the skin every 14 (fourteen) days.   famotidine (PEPCID) 20 MG tablet Take 1 tablet (20 mg total) by mouth at bedtime.   fenofibrate 160 MG tablet Take 1 tablet (160 mg total) by mouth daily.    glucose blood (ACCU-CHEK GUIDE) test strip Use 4 times daily as needed or directed.   isosorbide mononitrate (IMDUR) 30 MG 24 hr tablet Take 0.5 tablets (15 mg total) by mouth daily.   levothyroxine (SYNTHROID) 112 MCG tablet Take 1 tablet (112 mcg total) by mouth daily before breakfast. Take one tablet daily on an empty stomach.   Magnesium 250 MG TABS Take 1 tablet by mouth daily.   metFORMIN (GLUCOPHAGE) 500 MG tablet Take 500 mg by mouth 2 (two) times daily with a meal.   metoprolol tartrate (LOPRESSOR) 25 MG tablet Take 1 tablet (25 mg total) by mouth 2 (two) times daily. Take extra dose for break though Afib with increased HR >100   Niacin CR 250 MG TBCR 1 tablet with food   NOVOLIN 70/30 RELION (70-30) 100 UNIT/ML injection Inject into the skin. 25 for breakfast and 15 lunch and 36 supper   POTASSIUM CITRATE PO Take 99 mg by mouth daily.    Probiotic Product (PROBIOTIC DAILY PO) Take 99 mg by mouth daily.    RELION INSULIN SYR 0.5ML/31G 31G X 5/16" 0.5 ML MISC 2 (two) times daily. as directed   RELION PEN NEEDLES 32G X 4 MM MISC USE AS DIRECTED ONCE DAILY IN THE EVENING FOR 30 DAYS   repaglinide (PRANDIN) 2 MG tablet Take 2 mg by mouth in the morning and at bedtime.   influenza vaccine adjuvanted (FLUAD) 0.5 ML injection Inject into the muscle.  nitroGLYCERIN (NITROSTAT) 0.4 MG SL tablet Place 1 tablet (0.4 mg total) under the tongue every 5 (five) minutes as needed for chest pain. (Patient not taking: Reported on 04/04/2021)   No facility-administered encounter medications on file as of 04/04/2021.    Allergies (verified) Epinephrine, Other, Statins, Welchol [colesevelam hcl], and Amoxicillin   History: Past Medical History:  Diagnosis Date   Accelerating angina ([facility]) 11/04/2017   Asthma    Chronic pain disorder    neck and shoulder   Chronic pain of right upper extremity 11/10/2017   CTS (carpal tunnel syndrome) 01/27/2013   right   Diabetes mellitus type II    Diabetic  gastroparesis (HCC)    Esophageal reflux 08/25/2013   [place], Dr Barth Kirks   FATTY LIVER DISEASE 04/27/2007   Qualifier: Diagnosis of  By: Danelle Earthly CMA, Darlene     Focal muscle atrophy 09/26/2015   Fundic gland polyps of stomach, benign    Globus sensation 04/13/2018   HTN (hypertension)    Hyperlipidemia, mixed 05/04/2007   Qualifier: Diagnosis of  By: Wynona Luna crestor caused myalgias even at low dose Livalo caused myalgias Lipitor  Mother with severe reaction myalgias Simvastatin, Welchol    Hypertension 08/24/2010   Hypokalemia 05/25/2013   Improved stopping HCTZ. Was noted to have an elevated glucose when K was low. Recheck renal next week after starting Maxzide   Hypomagnesemia 08/10/2014   Hypothyroidism    Iron deficiency anemia 08/07/2018   Iron malabsorption 08/07/2018   Laryngitis 08/25/2017   Leg cramps 04/15/2016   Nausea without vomiting 08/08/2014   NONSPEC ELEVATION OF LEVELS OF TRANSAMINASE/LDH 05/01/2007   Qualifier: Diagnosis of  By: Garner Gavel     Osteoarthritis    chronic, right knee (11/04/2017)   OVARIAN CYST 07/11/2009   Qualifier: Diagnosis of  By: Wynona Luna    Ovarian cyst    Plantar fasciitis    Rosacea 10/22/2016   Urine incontinence    Past Surgical History:  Procedure Laterality Date   AUGMENTATION MAMMAPLASTY Bilateral 2002   BLADDER SUSPENSION  1981   COLONOSCOPY     CORONARY STENT INTERVENTION N/A 11/04/2017   Procedure: CORONARY STENT INTERVENTION;  Surgeon: Nelva Bush, MD;  Location: [place] [facility];  Service: Cardiovascular;  Laterality: N/A;   ENDOVENOUS ABLATION SAPHENOUS VEIN W/ LASER Left 12/29/2019   endovenous laser ablation left greater saphenous vein by Ruta Hinds MD    ESOPHAGOGASTRODUODENOSCOPY     INTRAVASCULAR PRESSURE WIRE/FFR STUDY N/A 11/04/2017   Procedure: INTRAVASCULAR PRESSURE WIRE/FFR STUDY;  Surgeon: Nelva Bush, MD;  Location: [place];  Service: Cardiovascular;  Laterality: N/A;    INTRAVASCULAR ULTRASOUND/IVUS N/A 11/04/2017   Procedure: Intravascular Ultrasound/IVUS;  Surgeon: Nelva Bush, MD;  Location: [place] [facility];  Service: Cardiovascular;  Laterality: N/A;   KNEE ARTHROSCOPY  1998, [id number]   LAPAROSCOPIC CHOLECYSTECTOMY  2007   LEFT HEART CATH AND CORONARY ANGIOGRAPHY N/A 11/04/2017   Procedure: LEFT HEART CATH AND CORONARY ANGIOGRAPHY;  Surgeon: Nelva Bush, MD;  Location: [place] [facility];  Service: Cardiovascular;  Laterality: N/A;   LEFT HEART CATHETERIZATION WITH CORONARY ANGIOGRAM N/A 11/21/2011   Procedure: LEFT HEART CATHETERIZATION WITH CORONARY ANGIOGRAM;  Surgeon: Josue Hector, MD;  Location[facility];  Service: Cardiovascular;  Laterality: N/A;   [place]   "still have my ovaries"   Family History  Problem Relation Age of Onset   Alzheimer's  disease Mother    Diabetes type II Mother    Hiatal hernia Mother    Diabetes Mother    Emphysema Father    COPD Father    Depression Sister        suicide   Diabetes Sister    Diabetes Daughter    Colon cancer Neg Hx    Stomach cancer Neg Hx    Social History   Socioeconomic History   Marital status: Married    Spouse name: Not on file   Number of children: 1   Years of education: Not on file   Highest education level: Not on file  Occupational History   Occupation: [employer]  Tobacco Use   Smoking status: Never   Smokeless tobacco: Never  Vaping Use   Vaping Use: Never used  Substance and Sexual Activity   Alcohol use: Not Currently   Drug use: Never   Sexual activity: Not Currently    Partners: Male  Other Topics Concern   Not on file  Social History Narrative   Marital status/children/pets: Married, 1 child.    Education/employment: HS   Safety:      -smoke alarm in the home:Yes     - wears seatbelt: Yes     - Feels safe in their relationships: Yes   Social Determinants of Health   Financial  Resource Strain: Low Risk    Difficulty of Paying Living Expenses: Not hard at all  Food Insecurity: No Food Insecurity   Worried About Charity fundraiser in the Last Year: Never true   Ran Out of Food in the Last Year: Never true  Transportation Needs: No Transportation Needs   Lack of Transportation (Medical): No   Lack of Transportation (Non-Medical): No  Physical Activity: Inactive   Days of Exercise per Week: 0 days   Minutes of Exercise per Session: 0 min  Stress: No Stress Concern Present   Feeling of Stress : Only a little  Social Connections: Moderately Isolated   Frequency of Communication with Friends and Family: More than three times a week   Frequency of Social Gatherings with Friends and Family: More than three times a week   Attends Religious Services: Never   Marine scientist or Organizations: No   Attends Music therapist: Never   Marital Status: Married    Tobacco Counseling Counseling given: Not Answered   Clinical Intake:  Pre-visit preparation completed: Yes  Pain : No/denies pain     BMI - recorded: 29.99 Nutritional Status: BMI 25 -29 Overweight Nutritional Risks: None Diabetes: Yes CBG done?: Yes (167 after breakfast) CBG resulted in Enter/ Edit results?: No Did pt. bring in CBG monitor from home?: No  How often do you need to have someone help you when you read instructions, pamphlets, or other written materials from your doctor or pharmacy?: 1 - Never  Diabetic?Nutrition Risk Assessment:  Has the patient had any N/V/D within the last 2 months?  No  Does the patient have any non-healing wounds?  No  Has the patient had any unintentional weight loss or weight gain?  No   Diabetes:  Is the patient diabetic?  Yes  If diabetic, was a CBG obtained today?  Yes  Did the patient bring in their glucometer from home?  No  How often do you monitor your CBG's? DAILY.   Financial Strains and Diabetes Management:  Are you  having any financial strains with the device, your supplies  or your medication? No .  Does the patient want to be seen by Chronic Care Management for management of their diabetes?  No  Would the patient like to be referred to a Nutritionist or for Diabetic Management?  No   Diabetic Exams:  Diabetic Eye Exam: Overdue for diabetic eye exam. Pt has been advised about the importance in completing this exam. Patient advised to call and schedule an eye exam. Diabetic Foot Exam: Completed 05/02/20   Interpreter Needed?: No  Information entered by :: Charlott Rakes, LPN   Activities of Daily Living In your present state of health, do you have any difficulty performing the following activities: 04/04/2021 08/27/2020  Hearing? N N  Vision? N N  Difficulty concentrating or making decisions? N N  Walking or climbing stairs? Y N  Comment painful to walk up stairs -  Dressing or bathing? N N  Doing errands, shopping? N N  Preparing Food and eating ? N -  Using the Toilet? N -  In the past six months, have you accidently leaked urine? Y -  Comment wears a pad -  Do you have problems with loss of bowel control? N -  Managing your Medications? N -  Managing your Finances? N -  Housekeeping or managing your Housekeeping? N -  Some recent data might be hidden    Patient Care Team: Ma Hillock, DO as PCP - General (Family Medicine) Minus Breeding, MD as PCP - Cardiology (Cardiology) Gatha Mayer, MD as Consulting Physician (Gastroenterology) Marshell Garfinkel, MD as Consulting Physician (Pulmonary Disease) Jacelyn Pi, MD as Referring Physician (Endocrinology)  Indicate any recent Medical Services you may have received from other than [facility] providers in the past year (date may be approximate).     Assessment:   This is a routine wellness examination for Cobie.  Hearing/Vision screen Hearing Screening - Comments:: Pt denies any hearing issues  Vision Screening - Comments:: Pt folow  up with [facility] for annual eye exams   Dietary issues and exercise activities discussed:     Goals Addressed             This Visit's Progress    Patient Stated       Lose weight        Depression Screen PHQ 2/9 Scores 04/04/2021 09/06/2020 06/03/2018 09/06/2014  PHQ - 2 Score 0 0 3 0  PHQ- 9 Score - - 8 -    Fall Risk Fall Risk  04/04/2021 09/06/2020 09/06/2014  Falls in the past year? 0 0 No  Number falls in past yr: 0 0 -  Injury with Fall? 0 0 -  Risk for fall due to : Impaired vision - -  Follow up Falls prevention discussed - -    FALL RISK PREVENTION PERTAINING TO THE HOME:  Any stairs in or around the home? No  If so, are there any without handrails? No  Home free of loose throw rugs in walkways, pet beds, electrical cords, etc? Yes  Adequate lighting in your home to reduce risk of falls? Yes   ASSISTIVE DEVICES UTILIZED TO PREVENT FALLS:  Life alert? No  Use of a cane, walker or w/c? No  Grab bars in the bathroom? No  Shower chair or bench in shower? Yes  Elevated toilet seat or a handicapped toilet? Yes   TIMED UP AND GO:  Was the test performed? Yes .  Length of time to ambulate 10 feet: 10 sec.   Gait steady  and fast without use of assistive device  Cognitive Function:     6CIT Screen 04/04/2021  What Year? 0 points  What month? 0 points  What time? 0 points  Count back from 20 0 points  Months in reverse 0 points  Repeat phrase 0 points  Total Score 0    Immunizations Immunization History  Administered Date(s) Administered   Fluad Quad(high Dose 65+) 02/25/2020, 03/13/2021   Influenza Split 03/27/2011, 04/08/2012   Influenza Whole 03/14/2009, 02/06/2010   Influenza, Quadrivalent, Recombinant, Inj, Pf 03/13/2021   Influenza,inj,Quad PF,6+ Mos 01/27/2013, 05/02/2014, 04/12/2017, 03/31/2018, 03/15/2019   PFIZER(Purple Top)SARS-COV-2 Vaccination 08/08/2019, 09/01/2019, 03/13/2021   Pfizer Covid-19 Vaccine Bivalent Booster 97yrs & up  03/13/2021   Pneumococcal Polysaccharide-23 03/28/2008   Tdap 09/18/2011   Zoster, Live 04/15/2016    TDAP status: Up to date  Flu Vaccine status: Up to date  Pneumococcal vaccine status: Due, Education has been provided regarding the importance of this vaccine. Advised may receive this vaccine at local pharmacy or Health Dept. Aware to provide a copy of the vaccination record if obtained from local pharmacy or Health Dept. Verbalized acceptance and understanding.  Covid-19 vaccine status: Completed vaccines  Qualifies for Shingles Vaccine? Yes   Zostavax completed Yes   Shingrix Completed?: No.    Education has been provided regarding the importance of this vaccine. Patient has been advised to call insurance company to determine out of pocket expense if they have not yet received this vaccine. Advised may also receive vaccine at local pharmacy or Health Dept. Verbalized acceptance and understanding.  Screening Tests Health Maintenance  Topic Date Due   Zoster Vaccines- Shingrix (1 of 2) Never done   Pneumonia Vaccine 75+ Years old (2 - PCV) 03/28/2009   URINE MICROALBUMIN  06/04/2019   OPHTHALMOLOGY EXAM  08/31/2020   HEMOGLOBIN A1C  10/31/2020   MAMMOGRAM  09/06/2021 (Originally 05/19/2020)   DEXA SCAN  09/06/2021 (Originally 09/29/2019)   COLONOSCOPY (Pts 45-41yrs Insurance coverage will need to be confirmed)  09/06/2021 (Originally 09/13/2017)   FOOT EXAM  05/02/2021   COVID-19 Vaccine (4 - Booster for Pfizer series) 05/08/2021   TETANUS/TDAP  09/17/2021   INFLUENZA VACCINE  Completed   Hepatitis C Screening  Completed   HPV VACCINES  Aged Out    Health Maintenance  Health Maintenance Due  Topic Date Due   Zoster Vaccines- Shingrix (1 of 2) Never done   Pneumonia Vaccine 47+ Years old (2 - PCV) 03/28/2009   URINE MICROALBUMIN  06/04/2019   OPHTHALMOLOGY EXAM  08/31/2020   HEMOGLOBIN A1C  10/31/2020    Colorectal cancer screening: Type of screening: Colonoscopy.  Completed 09/14/07. Repeat every 10 years pt stated unable to complete  Mammogram status: Completed 05/19/18. Repeat every year pt will call for appt when able     Additional Screening:  Hepatitis C Screening: Completed 11/25/14  Vision Screening: Recommended annual ophthalmology exams for early detection of glaucoma and other disorders of the eye. Is the patient up to date with their annual eye exam?  Yes  Who is the provider or what is the name of the office in which the patient attends annual eye exams? [facility] If pt is not established with a provider, would they like to be referred to a provider to establish care? No .   Dental Screening: Recommended annual dental exams for proper oral hygiene  Community Resource Referral / Chronic Care Management: CRR required this visit?  No   CCM required this visit?  No      Plan:     I have personally reviewed and noted the following in the patient's chart:   Medical and social history Use of alcohol, tobacco or illicit drugs  Current medications and supplements including opioid prescriptions.  Functional ability and status Nutritional status Physical activity Advanced directives List of other physicians Hospitalizations, surgeries, and ER visits in previous 12 months Vitals Screenings to include cognitive, depression, and falls Referrals and appointments  In addition, I have reviewed and discussed with patient certain preventive protocols, quality metrics, and best practice recommendations. A written personalized care plan for preventive services as well as general preventive health recommendations were provided to patient.     Willette Brace, LPN   [id number]/05/[id number]   Nurse Notes: Pt would like to revisit to schedule mammogram and bone density at later date.

## 2021-04-09 ENCOUNTER — Other Ambulatory Visit (HOSPITAL_BASED_OUTPATIENT_CLINIC_OR_DEPARTMENT_OTHER): Payer: Self-pay

## 2021-04-09 MED ORDER — PFIZER COVID-19 VAC BIVALENT 30 MCG/0.3ML IM SUSP
INTRAMUSCULAR | 0 refills | Status: DC
  Filled 2021-04-09: qty 0.3, 1d supply, fill #0

## 2021-04-10 ENCOUNTER — Encounter: Payer: Self-pay | Admitting: Hematology & Oncology

## 2021-04-10 ENCOUNTER — Other Ambulatory Visit (HOSPITAL_BASED_OUTPATIENT_CLINIC_OR_DEPARTMENT_OTHER): Payer: Self-pay

## 2021-04-10 MED ORDER — PFIZER COVID-19 VAC BIVALENT 30 MCG/0.3ML IM SUSP
INTRAMUSCULAR | 0 refills | Status: DC
  Filled 2021-04-10: qty 0.3, 1d supply, fill #0

## 2021-04-23 LAB — HM DIABETES EYE EXAM

## 2021-06-01 ENCOUNTER — Telehealth: Payer: Self-pay

## 2021-06-01 DIAGNOSIS — E039 Hypothyroidism, unspecified: Secondary | ICD-10-CM

## 2021-06-01 NOTE — Telephone Encounter (Signed)
Patient refill request.  She says that her insurance has changed this year along with pharmacy benefits. Patient must use [facility]  Please update preferred pharmacy; per patient remove [facility] and [facility].  Add [facility]  Patient needs new prescription sent to [facility].  Patient states mail order pharmacy has sent our office multiple requests and has not rec'd any communication from our office.  levothyroxine (SYNTHROID) 112 MCG tablet [[id number]]

## 2021-06-04 MED ORDER — LEVOTHYROXINE SODIUM 112 MCG PO TABS: 112.0000 ug | ORAL_TABLET | Freq: Every day | ORAL | 2 refills | Status: DC

## 2021-06-04 NOTE — Telephone Encounter (Signed)
Rx sent. Pharmacy updated

## 2021-06-13 ENCOUNTER — Other Ambulatory Visit: Payer: Self-pay

## 2021-06-13 MED ORDER — APIXABAN 5 MG PO TABS: 5.0000 mg | ORAL_TABLET | Freq: Two times a day (BID) | ORAL | 1 refills | Status: DC

## 2021-06-13 NOTE — Telephone Encounter (Signed)
Prescription refill request for Eliquis received. Indication:Afib Last office visit:8/22 Scr:1.0 Age: 66 Weight:83 kg  Prescription refilled

## 2021-06-26 ENCOUNTER — Other Ambulatory Visit: Payer: Self-pay | Admitting: *Deleted

## 2021-06-26 MED ORDER — METOPROLOL TARTRATE 25 MG PO TABS: 25.0000 mg | ORAL_TABLET | Freq: Two times a day (BID) | ORAL | 3 refills | Status: DC

## 2021-06-26 MED ORDER — FENOFIBRATE 160 MG PO TABS: 160.0000 mg | ORAL_TABLET | Freq: Every day | ORAL | 3 refills | Status: DC

## 2021-06-27 ENCOUNTER — Telehealth: Payer: Self-pay

## 2021-06-27 NOTE — Telephone Encounter (Signed)
Patient contacted about Vesalius sub study lab work. Informed patient that the labs drawn for lipids were going to be destroyed due to no consent signed for the sub study lipids. Patient verbalized understanding

## 2021-07-04 ENCOUNTER — Other Ambulatory Visit: Payer: Self-pay

## 2021-07-04 DIAGNOSIS — Z006 Encounter for examination for normal comparison and control in clinical research program: Secondary | ICD-10-CM

## 2021-07-04 NOTE — Research (Signed)
Patient seen in the clinic for Vesalius trial week 46. Patient administered the IP in the RUQ at 1125. No reactions noted. Medications reviewed and updated in the [facility]. She denies any AEs/SAEs, nor any hospitalizations since last visit. Next appointment will be May 31.  Vesalius-CV 64 Week Visit  Patient Name [name] [name] Subject ID# [id number]  Visit Date 07/04/21  Treatment compliance: Date returned: 07/04/21 Number returned from box# [id number]: 3 Number returned from box# [id number]:  4 Number returned-total: 7 Number of Full administrations:7  Number of partial administrations:0 Number of non administrations:0 Number of unknown administrations:0  Reason for any partial or non administrations:NA Number due to non compliance: Number due to device complaint: Number due to other:  IP Dispensation: Date dispensed 07/04/21 Number dispensed- total: 8 Number dispensed home to patient: 7 Box # [id number] Box # [id number]  IP Administration in Clinic: Box # [id number] Administration time 0938 [x] AM [] PM in clinic Quantity Administered    [] None [] Partial [x] Full Reason for None or Partial Dose ____ Administration Site [x] Abdomen [] Arm [] Thigh Laterality [] Left  [x] Right Administered by [x] Patient [] Caregiver

## 2021-07-19 LAB — HEPATIC FUNCTION PANEL
ALT: 30 U/L (ref 7–35)
AST: 24 (ref 13–35)
Alkaline Phosphatase: 66 (ref 25–125)
Bilirubin, Total: 0.4

## 2021-07-19 LAB — LIPID PANEL
Cholesterol: 187 (ref 0–200)
HDL: 43 (ref 35–70)
LDL Cholesterol: 116
LDl/HDL Ratio: 2.7
Triglycerides: 139 (ref 40–160)

## 2021-07-19 LAB — MICROALBUMIN, URINE
Microalb, Ur: 0.4
Microalb, Ur: 0.4

## 2021-07-19 LAB — BASIC METABOLIC PANEL
BUN: 21 (ref 4–21)
CO2: 25 — AB (ref 13–22)
Chloride: 104 (ref 99–108)
Creatinine: 1.1 (ref 0.5–1.1)
Glucose: 151
Potassium: 4.8 mEq/L (ref 3.5–5.1)
Sodium: 142 (ref 137–147)

## 2021-07-19 LAB — TSH: TSH: 0.11 — AB (ref 0.41–5.90)

## 2021-07-19 LAB — HEMOGLOBIN A1C: Hemoglobin A1C: 6.7

## 2021-07-19 LAB — PROTEIN / CREATININE RATIO, URINE: Creatinine, Urine: 104.7

## 2021-07-19 LAB — COMPREHENSIVE METABOLIC PANEL
Albumin: 4.6 (ref 3.5–5.0)
Calcium: 9.8 (ref 8.7–10.7)
eGFR: 55

## 2021-07-30 DIAGNOSIS — I480 Paroxysmal atrial fibrillation: Secondary | ICD-10-CM | POA: Insufficient documentation

## 2021-07-30 NOTE — Progress Notes (Signed)
Cardiology Office Note   Date:  07/31/2021   ID:  [name] [name] 04/24/1956, MRN [id number]  PCP:  Ma Hillock, DO  Cardiologist:   Minus Breeding, MD   Chief Complaint  Patient presents with   Coronary Artery Disease       History of Present Illness: [name] [name] is a 66 y.o. female who presents for follow up of coronary artery disease status post DES to ostial/proximal LAD in 6/19 with occlusion of D1 branch    She had a low risk POET (Plain Old Exercise Treadmill) in July 2021.   She presented to the [facility] on 08/26/2020 for evaluation of chest pain.  Echocardiogram 08/27/2020 showed LVEF 65-70% .  Nuclear stress test 08/27/2020 showed no reversible ischemia or infarct, EF 76% and low risk.  She developed paroxysmal atrial fibrillation 08/27/20 and was placed on IV heparin and diltiazem gtt. She converted to sinus rhythm after 2 hours.  CHA2DS2-VASc score 5, TSH 19.14 with free T4 of 0.80.  She was symptomatic and reported jaw tingling and chest pressure with the episode.  A cardiac event monitor was ordered and showed predominantly normal sinus rhythm, sinus bradycardia, sinus tachycardia, isolated SVE's, no atrial flutter atrial fibrillation or pauses noted.  Since she was last seen she has done well.  She retired.  She has a lot of stress and she is exercising more. The patient denies any new symptoms such as chest discomfort, neck or arm discomfort. There has been no new shortness of breath, PND or orthopnea. There have been no reported palpitations, presyncope or syncope.   She is being followed in the VESALIUS Trial.    Past Medical History:  Diagnosis Date   Accelerating angina ([place]) 11/04/2017   Asthma    Chronic pain disorder    neck and shoulder   Chronic pain of right upper extremity 11/10/2017   CTS (carpal tunnel syndrome) 01/27/2013   right   Diabetes mellitus type II    Diabetic gastroparesis (HCC)    Esophageal reflux 08/25/2013   [place]  [place], Dr Barth Kirks   FATTY LIVER DISEASE 04/27/2007   Qualifier: Diagnosis of  By: Danelle Earthly CMA, Darlene     Focal muscle atrophy 09/26/2015   Fundic gland polyps of stomach, benign    Globus sensation 04/13/2018   HTN (hypertension)    Hyperlipidemia, mixed 05/04/2007   Qualifier: Diagnosis of  By: Wynona Luna crestor caused myalgias even at low dose Livalo caused myalgias Lipitor  Mother with severe reaction myalgias Simvastatin, Welchol    Hypertension 08/24/2010   Hypokalemia 05/25/2013   Improved stopping HCTZ. Was noted to have an elevated glucose when K was low. Recheck renal next week after starting Maxzide   Hypomagnesemia 08/10/2014   Hypothyroidism    Iron deficiency anemia 08/07/2018   Iron malabsorption 08/07/2018   Laryngitis 08/25/2017   Leg cramps 04/15/2016   Nausea without vomiting 08/08/2014   NONSPEC ELEVATION OF LEVELS OF TRANSAMINASE/LDH 05/01/2007   Qualifier: Diagnosis of  By: Garner Gavel     Osteoarthritis    chronic, right knee (11/04/2017)   OVARIAN CYST 07/11/2009   Qualifier: Diagnosis of  By: Wynona Luna    Ovarian cyst    Plantar fasciitis    Rosacea 10/22/2016   Urine incontinence     Past Surgical History:  Procedure Laterality Date   AUGMENTATION MAMMAPLASTY Bilateral 2002   [place]  CORONARY STENT INTERVENTION N/A 11/04/2017   Procedure: CORONARY STENT INTERVENTION;  Surgeon: Nelva Bush, MD;  Location: [place] [facility];  Service: Cardiovascular;  Laterality: N/A;   ENDOVENOUS ABLATION SAPHENOUS VEIN W/ LASER Left 12/29/2019   endovenous laser ablation left greater saphenous vein by Ruta Hinds MD    ESOPHAGOGASTRODUODENOSCOPY     INTRAVASCULAR PRESSURE WIRE/FFR STUDY N/A 11/04/2017   Procedure: INTRAVASCULAR PRESSURE WIRE/FFR STUDY;  Surgeon: Nelva Bush, MD;  Location: [place] [facility];  Service: Cardiovascular;  Laterality: N/A;   INTRAVASCULAR ULTRASOUND/IVUS N/A 11/04/2017   Procedure:  Intravascular Ultrasound/IVUS;  Surgeon: Nelva Bush, MD;  Location: [place] [facility];  Service: Cardiovascular;  Laterality: N/A;   KNEE ARTHROSCOPY  1998, [id number]   LAPAROSCOPIC CHOLECYSTECTOMY  2007   LEFT HEART CATH AND CORONARY ANGIOGRAPHY N/A 11/04/2017   Procedure: LEFT HEART CATH AND CORONARY ANGIOGRAPHY;  Surgeon: Nelva Bush, MD;  Location: [place] [facility];  Service: Cardiovascular;  Laterality: N/A;   LEFT HEART CATHETERIZATION WITH CORONARY ANGIOGRAM N/A 11/21/2011   Procedure: LEFT HEART CATHETERIZATION WITH CORONARY ANGIOGRAM;  Surgeon: Josue Hector, MD;  Location: [facility] CATH [facility];  Service: Cardiovascular;  Laterality: N/A;   [place]   "still have my ovaries"     Current Outpatient Medications  Medication Sig Dispense Refill   Accu-Chek Softclix Lancets lancets Use 4 times daily as needed or directed 360 each 3   apixaban (ELIQUIS) 5 MG TABS tablet Take 1 tablet (5 mg total) by mouth 2 (two) times daily. 180 tablet 1   aspirin EC 81 MG tablet Take 81 mg by mouth daily. Swallow whole.     Calcium Carbonate (CALTRATE 600 PO) Take 600 mg by mouth 2 (two) times daily.      colesevelam (WELCHOL) 625 MG tablet Take 2 tablets (1,250 mg total) by mouth daily. 180 tablet 3   Evolocumab (REPATHA SURECLICK) 161 MG/ML SOAJ Inject 1 Dose into the skin every 14 (fourteen) days. 2 mL 11   famotidine (PEPCID) 20 MG tablet Take 1 tablet (20 mg total) by mouth at bedtime. 90 tablet 0   fenofibrate 160 MG tablet Take 1 tablet (160 mg total) by mouth daily. 90 tablet 3   glucose blood (ACCU-CHEK GUIDE) test strip Use 4 times daily as needed or directed. 100 each 3   influenza vaccine adjuvanted (FLUAD) 0.5 ML injection Inject into the muscle. 0.5 mL 0   isosorbide mononitrate (IMDUR) 30 MG 24 hr tablet Take 0.5 tablets (15 mg total) by mouth daily. 45 tablet 2   levothyroxine (SYNTHROID) 112 MCG tablet Take 1 tablet (112 mcg total) by mouth  daily before breakfast. Take one tablet daily on an empty stomach. 90 tablet 2   Magnesium 250 MG TABS Take 1 tablet by mouth daily.     metFORMIN (GLUCOPHAGE) 500 MG tablet Take 500 mg by mouth 2 (two) times daily with a meal.     metoprolol tartrate (LOPRESSOR) 25 MG tablet Take 1 tablet (25 mg total) by mouth 2 (two) times daily. Take extra dose for break though Afib with increased HR >100 180 tablet 3   Niacin CR 250 MG TBCR 1 tablet with food     nitroGLYCERIN (NITROSTAT) 0.4 MG SL tablet Place 1 tablet (0.4 mg total) under the tongue every 5 (five) minutes as needed for chest pain. (Patient not taking: Reported on 04/04/2021) 50 tablet 3   NOVOLIN 70/30 RELION (70-30) 100 UNIT/ML injection Inject  into the skin. 25 for breakfast and 15 lunch and 36 supper     POTASSIUM CITRATE PO Take 99 mg by mouth daily.      Probiotic Product (PROBIOTIC DAILY PO) Take 99 mg by mouth daily.      RELION INSULIN SYR 0.5ML/31G 31G X 5/16" 0.5 ML MISC 2 (two) times daily. as directed     RELION PEN NEEDLES 32G X 4 MM MISC USE AS DIRECTED ONCE DAILY IN THE EVENING FOR 30 DAYS     repaglinide (PRANDIN) 2 MG tablet Take 2 mg by mouth in the morning and at bedtime.     No current facility-administered medications for this visit.    Allergies:   Epinephrine, Other, Statins, Welchol [colesevelam hcl], and Amoxicillin    ROS:  Please see the history of present illness.   Otherwise, review of systems are positive for none.   All other systems are reviewed and negative.    PHYSICAL EXAM: VS:  BP 132/62    Pulse 68    Ht 5' 5.5" (1.664 m)    Wt 179 lb 12.8 oz (81.6 kg)    SpO2 98%    BMI 29.47 kg/m  , BMI Body mass index is 29.47 kg/m. GENERAL:  Well appearing NECK:  No jugular venous distention, waveform within normal limits, carotid upstroke brisk and symmetric, no bruits, no thyromegaly LUNGS:  Clear to auscultation bilaterally CHEST:  Unremarkable HEART:  PMI not displaced or sustained,S1 and S2 within  normal limits, no S3, no S4, no clicks, no rubs, no murmurs ABD:  Flat, positive bowel sounds normal in frequency in pitch, no bruits, no rebound, no guarding, no midline pulsatile mass, no hepatomegaly, no splenomegaly EXT:  2 plus pulses throughout, no edema, no cyanosis no clubbing  EKG:  EKG is  ordered today. The ekg ordered today demonstrates sinus rhythm with possible rate 68, axis within normal limits, intervals within normal limits, no acute ST-T wave changes.   Recent Labs: 09/06/2020: ALT 22; BUN 12; Creatinine, Ser 1.04; Hemoglobin 12.7; Platelets 242.0; Potassium 4.1; Sodium 140 10/20/2020: TSH 0.49    Lipid Panel    Component Value Date/Time   CHOL 210 (H) 08/27/2020 0119   CHOL 228 (H) 03/31/2020 1300   TRIG 253 (H) 08/27/2020 0119   HDL 38 (L) 08/27/2020 0119   HDL 47 03/31/2020 1300   CHOLHDL 5.5 08/27/2020 0119   VLDL 51 (H) 08/27/2020 0119   LDLCALC 121 (H) 08/27/2020 0119   LDLCALC 119 (H) 03/31/2020 1300   LDLDIRECT 151.0 06/01/2015 0812      Wt Readings from Last 3 Encounters:  07/31/21 179 lb 12.8 oz (81.6 kg)  04/04/21 183 lb (83 kg)  01/09/21 185 lb (83.9 kg)      Other studies Reviewed: Additional studies/ records that were reviewed today include: Labs Review of the above records demonstrates:  Please see elsewhere in the note.     ASSESSMENT AND PLAN:    Coronary artery disease:   The patient has no new sypmtoms.  No further cardiovascular testing is indicated.  We will continue with aggressive risk reduction and meds as listed.  Paroxysmal atrial fibrillation:   She does have a few palpitations in paroxysms but do not particularly bothersome.  She tolerates anticoagulation.  No change in therapy.    Essential hypertension: The blood pressure is mildly elevated but she says it is well controlled at home in the 120s.  No change in therapy.   Dyslipidemia: She is  followed in the VESALIUS trial.  I do see that she is on niacin and I have a  message out to make sure that this is not part of the trial as I would probably discontinue this.   Current medicines are reviewed at length with the patient today.  The patient does not have concerns regarding medicines.  The following changes have been made:  None  Labs/ tests ordered today include: None  Orders Placed This Encounter  Procedures   EKG 12-Lead      Disposition:   FU with me in 12 months.     Signed, Minus Breeding, MD  07/31/2021 8:17 PM    [facility]

## 2021-07-31 ENCOUNTER — Other Ambulatory Visit: Payer: Self-pay

## 2021-07-31 ENCOUNTER — Encounter: Payer: Self-pay | Admitting: Cardiology

## 2021-07-31 ENCOUNTER — Ambulatory Visit (INDEPENDENT_AMBULATORY_CARE_PROVIDER_SITE_OTHER): Payer: Medicare Other | Admitting: Cardiology

## 2021-07-31 VITALS — BP 132/62 | HR 68 | Ht 65.5 in | Wt 179.8 lb

## 2021-07-31 DIAGNOSIS — Z9861 Coronary angioplasty status: Secondary | ICD-10-CM

## 2021-07-31 DIAGNOSIS — I2510 Atherosclerotic heart disease of native coronary artery without angina pectoris: Secondary | ICD-10-CM

## 2021-07-31 DIAGNOSIS — I10 Essential (primary) hypertension: Secondary | ICD-10-CM

## 2021-07-31 DIAGNOSIS — I480 Paroxysmal atrial fibrillation: Secondary | ICD-10-CM

## 2021-07-31 NOTE — Patient Instructions (Signed)
Medication Instructions:  ?Your physician recommends that you continue on your current medications as directed. Please refer to the Current Medication list given to you today.  ? ?*If you need a refill on your cardiac medications before your next appointment, please call your pharmacy* ? ?Lab Work: ?NONE  ? ?Testing/Procedures: ?NONE ? ?Follow-Up: ?At [facility], you and your health needs are our priority.  As part of our continuing mission to provide you with exceptional heart care, we have created designated Provider Care Teams.  These Care Teams include your primary Cardiologist (physician) and Advanced Practice Providers (APPs -  Physician Assistants and Nurse Practitioners) who all work together to provide you with the care you need, when you need it. ? ?We recommend signing up for the patient portal called "MyChart".  Sign up information is provided on this After Visit Summary.  MyChart is used to connect with patients for Virtual Visits (Telemedicine).  Patients are able to view lab/test results, encounter notes, upcoming appointments, etc.  Non-urgent messages can be sent to your provider as well.   ?To learn more about what you can do with MyChart, go to [email or web address].   ? ?Your next appointment:   ?12 month(s) ? ?The format for your next appointment:   ?In Person ? ?Provider:   ?Minus Breeding, MD { ? ?

## 2021-08-02 ENCOUNTER — Telehealth: Payer: Self-pay | Admitting: *Deleted

## 2021-08-02 MED ORDER — NITROGLYCERIN 0.4 MG SL SUBL: 0.4000 mg | SUBLINGUAL_TABLET | SUBLINGUAL | 99 refills | Status: AC | PRN

## 2021-08-02 NOTE — Telephone Encounter (Signed)
-----   Message from Minus Breeding, MD sent at 07/31/2021  8:11 PM EST ----- ?Can you please have her stop her niaspan.  Thanks.  ?----- Message ----- ?From: Pixie Casino, MD ?Sent: 07/31/2021   4:49 PM EST ?To: Minus Breeding, MD ? ?VESALIUS is [place] - niacin is not part of the trial. I don't use it either - data is not great with everything else we have now and side effects are dismal. She may be taking it OTC or from her PCP.  I would have no aversion to stopping it. ? ?-Mali ? ?----- Message ----- ?From: Minus Breeding, MD ?Sent: 07/31/2021   4:29 PM EST ?To: Pixie Casino, MD ? ?Hi this patient is in the VESALIUS trial and is on Niacin which I have never prescribed.  Is this part of the trial.  Thanks.  ? ? ? ?

## 2021-08-02 NOTE — Telephone Encounter (Signed)
Spoke with patient and she has not been on Niacin for quite some time ?Removed from medication list  ?

## 2021-08-02 NOTE — Telephone Encounter (Signed)
Left message to call back  

## 2021-08-06 ENCOUNTER — Other Ambulatory Visit: Payer: Self-pay

## 2021-08-06 DIAGNOSIS — K219 Gastro-esophageal reflux disease without esophagitis: Secondary | ICD-10-CM

## 2021-08-06 MED ORDER — FAMOTIDINE 20 MG PO TABS: 20.0000 mg | ORAL_TABLET | Freq: Every day | ORAL | 0 refills | Status: DC

## 2021-08-06 NOTE — Telephone Encounter (Signed)
Famotidine refilled and note attached to call for appointment please. ?

## 2021-08-14 DIAGNOSIS — Z006 Encounter for examination for normal comparison and control in clinical research program: Secondary | ICD-10-CM

## 2021-08-14 MED ORDER — STUDY - VESALIUS - EVOLOCUMAB (AMG 145) 140 MG/ML OR PLACEBO SQ INJECTION (PI-HILTY): 140.0000 mg | INJECTION | SUBCUTANEOUS | Status: DC

## 2021-09-13 ENCOUNTER — Encounter: Payer: Self-pay | Admitting: Cardiology

## 2021-09-25 ENCOUNTER — Other Ambulatory Visit: Payer: Self-pay

## 2021-09-25 LAB — TSH: TSH: 0.31 — AB (ref <=5.90)

## 2021-09-25 MED ORDER — ISOSORBIDE MONONITRATE ER 30 MG PO TB24: 15.0000 mg | ORAL_TABLET | Freq: Every day | ORAL | 3 refills | Status: DC

## 2021-09-28 ENCOUNTER — Telehealth: Payer: Self-pay

## 2021-09-28 ENCOUNTER — Ambulatory Visit (INDEPENDENT_AMBULATORY_CARE_PROVIDER_SITE_OTHER): Payer: Medicare Other | Admitting: Internal Medicine

## 2021-09-28 ENCOUNTER — Encounter: Payer: Self-pay | Admitting: Internal Medicine

## 2021-09-28 VITALS — BP 110/62 | HR 75 | Ht 65.5 in | Wt 177.2 lb

## 2021-09-28 DIAGNOSIS — Z9861 Coronary angioplasty status: Secondary | ICD-10-CM

## 2021-09-28 DIAGNOSIS — I2510 Atherosclerotic heart disease of native coronary artery without angina pectoris: Secondary | ICD-10-CM

## 2021-09-28 DIAGNOSIS — K219 Gastro-esophageal reflux disease without esophagitis: Secondary | ICD-10-CM | POA: Diagnosis not present

## 2021-09-28 DIAGNOSIS — Z7901 Long term (current) use of anticoagulants: Secondary | ICD-10-CM | POA: Diagnosis not present

## 2021-09-28 DIAGNOSIS — Z1211 Encounter for screening for malignant neoplasm of colon: Secondary | ICD-10-CM

## 2021-09-28 MED ORDER — CLENPIQ 10-3.5-12 MG-GM -GM/160ML PO SOLN: ORAL | 0 refills | Status: DC

## 2021-09-28 MED ORDER — METOCLOPRAMIDE HCL 10 MG PO TABS: ORAL_TABLET | ORAL | 0 refills | Status: DC

## 2021-09-28 MED ORDER — PANTOPRAZOLE SODIUM 40 MG PO TBEC: 40.0000 mg | DELAYED_RELEASE_TABLET | Freq: Two times a day (BID) | ORAL | 3 refills | Status: DC

## 2021-09-28 NOTE — Progress Notes (Signed)
? ?[name] [name] 66 y.o. 1955-10-18 [id number] ? ?Assessment & Plan:  ? ?Encounter Diagnoses  ?Name Primary?  ? Gastroesophageal reflux disease, unspecified whether esophagitis present Yes  ? Colon cancer screening   ? Long term current use of anticoagulant - Eliquis - Afib   ? ? ?Refill pantoprazole 40 mg twice daily. ? ?Schedule screening colonoscopy.  Her iron deficiency has resolved and she has held so I do not think that is an indication for the procedure anymore. ? ?Hold Eliquis day before and day of (2 days prior to procedure).  This reduces the risk of periprocedural bleeding though there is a very small but rare increased risk of stroke while off this medication which she understands and accepts.  We will clarify with cardiology as well.  I have explained the risks benefits and indications otherwise and she will read our informed consent sheet as well. ? ?She did attempt a colonoscopy in 2020 but had vomiting.  We will try Clenpiq prep and she will take 10 mg of metoclopramide p.o. 1 hour before drinking her prep each time.  Her QTc is not prolonged. ? ?I appreciate the opportunity to care for this patient. ?CC: Kuneff, [name], DO ? ? ? ?Subjective:  ? ?Chief Complaint: GERD, refill of pantoprazole ? ?HPI ?66 year old white woman with a history of GERD and gastroparesis problems, diabetes mellitus type 2, metabolic syndrome with coronary artery disease and also A-fib. ?Patient was last seen in 2020, she had iron deficiency anemia had an EGD, she was to plan to schedule a colonoscopy as she was coming off Brilinta but that never happened.  Also carries a diagnosis of diabetic gastroparesis.  Last visit was a virtual visit June 2020.  Around that time she had vomiting issues that stopped when Victoza was discontinued.  She was treated with parenteral iron through Dr. Marin Olp in 2020 also.  CBC has recovered as you can see below.  She has run out of her pantoprazole and is asking for a refill.  It  controlled heartburn quite well.  She is currently using some Tums while waiting for refill medication. ? ? ?Lab Results  ?Component Value Date  ? WBC 5.7 09/06/2020  ? HGB 12.7 09/06/2020  ? HCT 37.7 09/06/2020  ? MCV 86.6 09/06/2020  ? PLT 242.0 09/06/2020  ? ?She has coronary artery disease and saw Dr. Percival Spanish of cardiology July 31, 2020 she has an EF 65 to 70% last year and a negative nuclear stress test 08/27/2020.  She also has a history of A-fib with a stroke risk score of 5.  She is also in a research study for dyslipidemia ? ? ?EGD 08/06/2018 ? ?- A single gastric polyp. Biopsied. Cardia - friable hyperplastic ?- Multiple gastric polyps. Biopsied. A few were friable fundic gland polyps ?- Erythematous mucosa in the antrum. Biopsied.  Gastritis without H. pylori ?- The examination was otherwise normal. ?A 2009 colonoscopy is listed in the chart is normal (Dr. Sharlett Iles) ?Allergies  ?Allergen Reactions  ? Epinephrine Other (See Comments)  ?  Pass out   ? Other   ?  Other reaction(s): Myalgia  ? Statins Other (See Comments)  ?  Myalgias: Simvastatin also caused back ache  ? Welchol [Colesevelam Hcl]   ?  myalgia  ? Amoxicillin Itching and Rash  ?  Has patient had a PCN reaction causing immediate rash, facial/tongue/throat swelling, SOB or lightheadedness with hypotension: No ?Has patient had a PCN reaction causing severe rash involving mucus  membranes or skin necrosis: No ?Has patient had a PCN reaction that required hospitalization: No ?Has patient had a PCN reaction occurring within the last 10 years: Yes ?If all of the above answers are "NO", then may proceed with Cephalosporin use. ?  ? ?Current Meds  ?Medication Sig  ? Accu-Chek Softclix Lancets lancets Use 4 times daily as needed or directed  ? apixaban (ELIQUIS) 5 MG TABS tablet Take 1 tablet (5 mg total) by mouth 2 (two) times daily.  ? Calcium Carbonate (CALTRATE 600 PO) Take 600 mg by mouth 2 (two) times daily.   ? Evolocumab (REPATHA SURECLICK) 347  MG/ML SOAJ Inject 1 Dose into the skin every 14 (fourteen) days.  ? famotidine (PEPCID) 20 MG tablet Take 1 tablet (20 mg total) by mouth at bedtime.  ? fenofibrate 160 MG tablet Take 1 tablet (160 mg total) by mouth daily.  ? glucose blood (ACCU-CHEK GUIDE) test strip Use 4 times daily as needed or directed.  ? isosorbide mononitrate (IMDUR) 30 MG 24 hr tablet Take 0.5 tablets (15 mg total) by mouth daily.  ? levothyroxine (SYNTHROID) 112 MCG tablet Take 1 tablet (112 mcg total) by mouth daily before breakfast. Take one tablet daily on an empty stomach.  ? Magnesium 250 MG TABS Take 1 tablet by mouth daily.  ? metFORMIN (GLUCOPHAGE) 500 MG tablet Take 500 mg by mouth 2 (two) times daily with a meal.  ? metoprolol tartrate (LOPRESSOR) 25 MG tablet Take 1 tablet (25 mg total) by mouth 2 (two) times daily. Take extra dose for break though Afib with increased HR >100  ? nitroGLYCERIN (NITROSTAT) 0.4 MG SL tablet Place 1 tablet (0.4 mg total) under the tongue every 5 (five) minutes as needed for chest pain.  ? NOVOLIN 70/30 RELION (70-30) 100 UNIT/ML injection Inject into the skin. 25 for breakfast and 15 lunch and 36 supper  ? POTASSIUM CITRATE PO Take 99 mg by mouth daily.   ? Probiotic Product (PROBIOTIC DAILY PO) Take 99 mg by mouth daily.   ? RELION INSULIN SYR 0.5ML/31G 31G X 5/16" 0.5 ML MISC 2 (two) times daily. as directed  ? RELION PEN NEEDLES 32G X 4 MM MISC USE AS DIRECTED ONCE DAILY IN THE EVENING FOR 30 DAYS  ? repaglinide (PRANDIN) 2 MG tablet Take 2 mg by mouth in the morning and at bedtime.  ? ?Current Facility-Administered Medications for the 09/28/21 encounter (Office Visit) with Gatha Mayer, MD  ?Medication  ? Study - VESALIUS - evolocumab (AMG 145) 140 mg/mL or placebo SQ injection ([facility])  ? ?Past Medical History:  ?Diagnosis Date  ? Accelerating angina ([place]) 11/04/2017  ? Asthma   ? Chronic pain disorder   ? neck and shoulder  ? Chronic pain of right upper extremity 11/10/2017  ? CTS (carpal  tunnel syndrome) 01/27/2013  ? right  ? Diabetes mellitus type II   ? Diabetic gastroparesis ([place])   ? Esophageal reflux 08/25/2013  ? [place] [employer], Dr Barth Kirks  ? FATTY LIVER DISEASE 04/27/2007  ? Qualifier: Diagnosis of  By: Danelle Earthly CMA, Darlene    ? Focal muscle atrophy 09/26/2015  ? Fundic gland polyps of stomach, benign   ? Globus sensation 04/13/2018  ? HTN (hypertension)   ? Hyperlipidemia, mixed 05/04/2007  ? Qualifier: Diagnosis of  By: Wynona Luna crestor caused myalgias even at low dose Livalo caused myalgias Lipitor  Mother with severe reaction myalgias Simvastatin, Welchol   ? Hypertension 08/24/2010  ? Hypokalemia  05/25/2013  ? Improved stopping HCTZ. Was noted to have an elevated glucose when K was low. Recheck renal next week after starting Maxzide  ? Hypomagnesemia 08/10/2014  ? Hypothyroidism   ? Iron deficiency anemia 08/07/2018  ? Iron malabsorption 08/07/2018  ? Laryngitis 08/25/2017  ? Leg cramps 04/15/2016  ? Nausea without vomiting 08/08/2014  ? NONSPEC ELEVATION OF LEVELS OF TRANSAMINASE/LDH 05/01/2007  ? Qualifier: Diagnosis of  By: Garner Gavel    ? Osteoarthritis   ? chronic, right knee (11/04/2017)  ? OVARIAN CYST 07/11/2009  ? Qualifier: Diagnosis of  By: Wynona Luna   ? Ovarian cyst   ? Plantar fasciitis   ? Rosacea 10/22/2016  ? Urine incontinence   ? ?Past Surgical History:  ?Procedure Laterality Date  ? AUGMENTATION MAMMAPLASTY Bilateral 2002  ? BLADDER SUSPENSION  1981  ? COLONOSCOPY    ? CORONARY STENT INTERVENTION N/A 11/04/2017  ? Procedure: CORONARY STENT INTERVENTION;  Surgeon: Nelva Bush, MD;  Location: [place] [facility];  Service: Cardiovascular;  Laterality: N/A;  ? ENDOVENOUS ABLATION SAPHENOUS VEIN W/ LASER Left 12/29/2019  ? endovenous laser ablation left greater saphenous vein by Ruta Hinds MD   ? ESOPHAGOGASTRODUODENOSCOPY    ? INTRAVASCULAR PRESSURE WIRE/FFR STUDY N/A 11/04/2017  ? Procedure: INTRAVASCULAR PRESSURE WIRE/FFR STUDY;  Surgeon: Nelva Bush,  MD;  Location: [place] [facility];  Service: Cardiovascular;  Laterality: N/A;  ? INTRAVASCULAR ULTRASOUND/IVUS N/A 11/04/2017  ? Procedure: Intravascular Ultrasound/IVUS;  Surgeon: Nelva Bush, MD;  Location

## 2021-09-28 NOTE — Patient Instructions (Addendum)
You have been scheduled for a colonoscopy. Please follow written instructions given to you at your visit today.  ?Please pick up your prep supplies at the pharmacy within the next 1-3 days. ?If you use inhalers (even only as needed), please bring them with you on the day of your procedure. ? ?You will be contaced by our office prior to your procedure for directions on holding your Eliquis.  If you do not hear from our office 1 week prior to your scheduled procedure, please call [phone] to discuss. ? ? ?We have sent the following medications to your pharmacy for you to pick up at your convenience: ?Generic reglan to use when you are prepping for the colonoscopy ? ?We have sent the following prescriptions to your mail in pharmacy: ?pantoprazole ? ?If you have not heard from your mail in pharmacy within 1 week or if you have not received your medication in the mail, please contact us at [phone] so we may find out why. ? ? ? ?I appreciate the opportunity to care for you. ?Silvano Rusk, MD, [facility] ? ?. ?

## 2021-09-28 NOTE — Telephone Encounter (Signed)
Pharmacy, can you please comment on how long Eliquis can be held for upcoming colonoscopy? They are asking to hold the day before and day of. ? ?Thank you! ?

## 2021-09-28 NOTE — Telephone Encounter (Signed)
[facility] Pre-operative Risk Assessment  ?   ?Request for surgical clearance:     Endoscopy Procedure ? ?What type of surgery is being performed?     colonoscopy ? ?When is this surgery scheduled?     12/04/2021 ? ?What type of clearance is required ?   Pharmacy ? ?Are there any medications that need to be held prior to surgery and how long? Eliquis, day before and day of ? ?Practice name and name of physician performing surgery?      [facility] ? ?What is your office phone and fax number?      Phone- [phone]  Fax- [phone] ? ?Anesthesia type (None, local, MAC, general) ?       [facility] ? ?

## 2021-10-01 ENCOUNTER — Other Ambulatory Visit: Payer: Self-pay | Admitting: *Deleted

## 2021-10-01 DIAGNOSIS — Z01818 Encounter for other preprocedural examination: Secondary | ICD-10-CM

## 2021-10-01 NOTE — Telephone Encounter (Signed)
Patient with diagnosis of atrial fibrillation on Eliquis for anticoagulation.   ? ?Procedure: colonoscopy ?Date of procedure: 12/04/21 ? ? ?CHA2DS2-VASc Score = 5  ? This indicates a 7.2% annual risk of stroke. ?The patient's score is based upon: ?CHF History: 0 ?HTN History: 1 ?Diabetes History: 1 ?Stroke History: 0 ?Vascular Disease History: 1 ?Age Score: 1 ?Gender Score: 1 ?  ? ?Patient needs updated BMET, CBC for determination of hold.  Last labs 08/2020 ? ? ? ?

## 2021-10-01 NOTE — Telephone Encounter (Signed)
Lvm for pt to call preop and schedule lab appointment. Labs placed in system. ?

## 2021-10-02 NOTE — Telephone Encounter (Signed)
? ?  Pt returned call, she said she will do her labs at [facility] office, I provided business hours of out lab and she said she will go there sometimes next week ?

## 2021-10-10 ENCOUNTER — Encounter: Payer: Self-pay | Admitting: Family Medicine

## 2021-10-10 ENCOUNTER — Ambulatory Visit (INDEPENDENT_AMBULATORY_CARE_PROVIDER_SITE_OTHER): Payer: Medicare Other

## 2021-10-10 ENCOUNTER — Ambulatory Visit (INDEPENDENT_AMBULATORY_CARE_PROVIDER_SITE_OTHER): Payer: Medicare Other | Admitting: Family Medicine

## 2021-10-10 VITALS — BP 102/61 | HR 67 | Temp 97.6°F | Ht 65.5 in | Wt 178.0 lb

## 2021-10-10 DIAGNOSIS — M546 Pain in thoracic spine: Secondary | ICD-10-CM

## 2021-10-10 DIAGNOSIS — M792 Neuralgia and neuritis, unspecified: Secondary | ICD-10-CM | POA: Diagnosis not present

## 2021-10-10 MED ORDER — BACLOFEN 10 MG PO TABS: 5.0000 mg | ORAL_TABLET | Freq: Two times a day (BID) | ORAL | 0 refills | Status: DC

## 2021-10-10 MED ORDER — METHYLPREDNISOLONE 4 MG PO TBPK: ORAL_TABLET | ORAL | 0 refills | Status: DC

## 2021-10-10 NOTE — Patient Instructions (Signed)
[facility] is located at [address] [place], [place] [address] ? ? ?Thoracic Strain ?A thoracic strain is an injury to the muscles or tendons that attach to the upper back. Tendons are tissues that connect muscle to bone. This injury is sometimes called a mid-back strain. ?A strain can be mild or very bad. A mild strain may take only 1-2 weeks to heal. A very bad strain involves torn muscles or tendons, so it may take 6-8 weeks to heal. ?What are the causes? ?This condition may be caused by: ?A fall or a hit to the body. ?Twisting or stretching the back too far. This may happen when doing activities that require a lot of energy, such as lifting heavy objects. ?In some cases, the cause may not be known. ?What increases the risk? ?This injury is more common in: ?Athletes. ?People who are very overweight (obese). ?What are the signs or symptoms? ?Pain in the middle back, especially with movement. This is the main symptom. ?Stiffness or limited range of motion. ?Sudden muscle tightening (spasms). ?How is this treated? ?This condition may be treated with: ?Resting the injured area. ?Putting heat and cold on the injured area. ?Medicines for pain and inflammation, such as NSAIDs. ?Prescription medicine for pain or to relax the muscles. These may be used for a short time if needed. ?Physical therapy. This will involve doing exercises to stretch and strengthen the middle back. ?Follow these instructions at home: ?Managing pain, stiffness, and swelling ? ?  ? ?If told, put ice on the injured area. ?Put ice in a plastic bag. ?Place a towel between your skin and the bag. ?Leave the ice on for 20 minutes, 2-3 times a day. ?If told, put heat on the affected area. Do this as often as told by your doctor. Use the heat source that your doctor recommends, such as a moist heat pack or a heating pad. ?Place a towel between your skin and the heat source. ?Leave the heat on for 20-30 minutes. ?Remove the heat if your skin turns  bright red. This is very important if you are unable to feel pain, heat, or cold. You may have a greater risk of getting burned. ?Activity ?Rest and return to your normal activities as told by your doctor. Ask your doctor what activities are safe for you. ?Do exercises as told by your doctor. ?Medicines ?Take over-the-counter and prescription medicines only as told by your doctor. ?Ask your doctor if the medicine prescribed to you: ?Requires you to avoid driving or using heavy machinery. ?Can cause trouble pooping (constipation). You may need to take steps to prevent or treat trouble pooping: ?Drink enough fluid to keep your pee (urine) pale yellow. ?Take over-the-counter or prescription medicines. ?Eat foods that are high in fiber. These include beans, whole grains, and fresh fruits and vegetables. ?Limit foods that are high in fat and processed sugars. These include fried or sweet foods. ?Injury prevention ?To prevent a future mid-back injury: ?Always warm up before physical activity or sports. ?Cool down and stretch after being active. ?Use correct form when playing sports and lifting heavy objects. Bend your knees before you lift heavy objects. ?Use good posture when sitting and standing. ?Stay physically fit and maintain a healthy weight. ?Do at least 150 minutes of moderate-intensity exercise each week, such as brisk walking or water aerobics. ?Do strength exercises at least 2 times each week. ? ?General instructions ?Do not use any products that contain nicotine or tobacco. These products include cigarettes, e-cigarettes,  and chewing tobacco. If you need help quitting, ask your doctor. ?Keep all follow-up visits as told by your doctor. This is important. ?Contact a doctor if: ?Your pain is not helped by medicine. ?Your pain or stiffness is getting worse. ?You have pain or stiffness in your neck or lower back. ?Get help right away if you: ?Have shortness of breath. ?Have chest pain. ?Have weakness or loss of  feeling (numbness) in your legs or arms. ?Cannot control when you pee (urinate). ?Summary ?A thoracic strain is an injury to the muscles or tendons that attach to the upper back. ?If told, put ice or heat on the affected area. ?Rest and return to your normal activities as told by your doctor. ?Keep all follow-up visits as told by your doctor. ?This information is not intended to replace advice given to you by your health care provider. Make sure you discuss any questions you have with your health care provider. ?Document Revised: 03/14/2021 Document Reviewed: 03/14/2021 ?Elsevier Patient Education ? [place]. ? ?

## 2021-10-10 NOTE — Progress Notes (Signed)
[name] [name] , 04/04/1956, 66 y.o., female MRN: [id number] Patient Care Team    Relationship Specialty Notifications Start End  Ma Hillock, DO PCP - General Family Medicine  06/03/18   Minus Breeding, MD PCP - Cardiology Cardiology Admissions 11/14/17   Gatha Mayer, MD Consulting Physician Gastroenterology  06/05/18   Marshell Garfinkel, MD Consulting Physician Pulmonary Disease  06/05/18   Jacelyn Pi, MD Referring Physician Endocrinology  09/07/20     Chief Complaint  Patient presents with   Back Pain    Pt c/o upper back that radiates to R shoulder and mid back x 2 mos;      Subjective: Pt presents for an OV with complaints of right thoracic back pain of 2 months duration.  She reports no increase in activity or injury at that time that she was aware of.  He had been using Biofreeze and had gone to the chiropractor.  She reports the chiropractor made it feel better, but then all the symptoms returned within 24 hours.  She is having some dull numbness that can radiate to the axilla and down the back of her arm to her elbow.  She reports she even cannot tell if she is shaving under her arm because she cannot feel the sensation or when she applies deodorant.  Again, when she had chiropractic care the sensation also returned for 24 hours.  She denies any radiation of numbness or pain into her hands/fingers.     04/04/2021   11:48 AM 09/06/2020   11:25 AM 06/03/2018    4:19 PM 09/06/2014    8:33 AM  Depression screen PHQ 2/9  Decreased Interest 0 0 2 0  Down, Depressed, Hopeless 0 0 1 0  PHQ - 2 Score 0 0 3 0  Altered sleeping   0   Tired, decreased energy   2   Change in appetite   3   Feeling bad or failure about yourself    0   Trouble concentrating   0   Moving slowly or fidgety/restless   0   Suicidal thoughts   0   PHQ-9 Score   8     Allergies  Allergen Reactions   Epinephrine Other (See Comments)    Pass out    Other     Other reaction(s): Myalgia    Statins Other (See Comments)    Myalgias: Simvastatin also caused back ache   Welchol [Colesevelam Hcl]     myalgia   Amoxicillin Itching and Rash    Has patient had a PCN reaction causing immediate rash, facial/tongue/throat swelling, SOB or lightheadedness with hypotension: No Has patient had a PCN reaction causing severe rash involving mucus membranes or skin necrosis: No Has patient had a PCN reaction that required hospitalization: No Has patient had a PCN reaction occurring within the last 10 years: Yes If all of the above answers are "NO", then may proceed with Cephalosporin use.    Social History   Social History Narrative   Marital status/children/pets: Married, 1 child.    Education/employment: HS   Safety:      -smoke alarm in the home:Yes     - wears seatbelt: Yes     - Feels safe in their relationships: Yes   Past Medical History:  Diagnosis Date   Accelerating angina ([place]) 11/04/2017   Asthma    Chronic pain disorder    neck and shoulder   Chronic pain of right upper extremity  11/10/2017   CTS (carpal tunnel syndrome) 01/27/2013   right   Diabetes mellitus type II    Diabetic gastroparesis (HCC)    Esophageal reflux 08/25/2013   [place], Dr Barth Kirks   FATTY LIVER DISEASE 04/27/2007   Qualifier: Diagnosis of  By: Danelle Earthly CMA, Darlene     Focal muscle atrophy 09/26/2015   Fundic gland polyps of stomach, benign    Globus sensation 04/13/2018   HTN (hypertension)    Hyperlipidemia, mixed 05/04/2007   Qualifier: Diagnosis of  By: Wynona Luna crestor caused myalgias even at low dose Livalo caused myalgias Lipitor  Mother with severe reaction myalgias Simvastatin, Welchol    Hypokalemia 05/25/2013   Improved stopping HCTZ. Was noted to have an elevated glucose when K was low. Recheck renal next week after starting Maxzide   Hypomagnesemia 08/10/2014   Hypothyroidism    Iron deficiency anemia 08/07/2018   Iron malabsorption 08/07/2018    Laryngitis 08/25/2017   Leg cramps 04/15/2016   Nausea without vomiting 08/08/2014   NONSPEC ELEVATION OF LEVELS OF TRANSAMINASE/LDH 05/01/2007   Qualifier: Diagnosis of  By: Garner Gavel     Osteoarthritis    chronic, right knee (11/04/2017)   OVARIAN CYST 07/11/2009   Qualifier: Diagnosis of  By: Wynona Luna    Ovarian cyst    Plantar fasciitis    Rosacea 10/22/2016   Urine incontinence    Past Surgical History:  Procedure Laterality Date   AUGMENTATION MAMMAPLASTY Bilateral 2002   BLADDER SUSPENSION  1981   COLONOSCOPY     CORONARY STENT INTERVENTION N/A 11/04/2017   Procedure: CORONARY STENT INTERVENTION;  Surgeon: Nelva Bush, MD;  Location: [facility];  Service: Cardiovascular;  Laterality: N/A;   ENDOVENOUS ABLATION SAPHENOUS VEIN W/ LASER Left 12/29/2019   endovenous laser ablation left greater saphenous vein by Ruta Hinds MD    ESOPHAGOGASTRODUODENOSCOPY     INTRAVASCULAR PRESSURE WIRE/FFR STUDY N/A 11/04/2017   Procedure: INTRAVASCULAR PRESSURE WIRE/FFR STUDY;  Surgeon: Nelva Bush, MD;  Location: [facility];  Service: Cardiovascular;  Laterality: N/A;   INTRAVASCULAR ULTRASOUND/IVUS N/A 11/04/2017   Procedure: Intravascular Ultrasound/IVUS;  Surgeon: Nelva Bush, MD;  Location: [facility];  Service: Cardiovascular;  Laterality: N/A;   KNEE ARTHROSCOPY  1998, [id number]   LAPAROSCOPIC CHOLECYSTECTOMY  2007   LEFT HEART CATH AND CORONARY ANGIOGRAPHY N/A 11/04/2017   Procedure: LEFT HEART CATH AND CORONARY ANGIOGRAPHY;  Surgeon: Nelva Bush, MD;  Location: [facility];  Service: Cardiovascular;  Laterality: N/A;   LEFT HEART CATHETERIZATION WITH CORONARY ANGIOGRAM N/A 11/21/2011   Procedure: LEFT HEART CATHETERIZATION WITH CORONARY ANGIOGRAM;  Surgeon: Josue Hector, MD;  Location: [facility];  Service: Cardiovascular;  Laterality: N/A;   [place]   "still have my ovaries"    Family History  Problem Relation Age of Onset   Alzheimer's disease Mother    Diabetes type II Mother    Hiatal hernia Mother    Diabetes Mother    Emphysema Father    COPD Father    Depression Sister        suicide   Diabetes Sister    Diabetes Daughter    Colon cancer Neg Hx    Stomach cancer Neg Hx    Allergies as of 10/10/2021       Reactions   Epinephrine Other (See Comments)   Pass out    Other  Other reaction(s): Myalgia   Statins Other (See Comments)   Myalgias: Simvastatin also caused back ache   Welchol [colesevelam Hcl]    myalgia   Amoxicillin Itching, Rash   Has patient had a PCN reaction causing immediate rash, facial/tongue/throat swelling, SOB or lightheadedness with hypotension: No Has patient had a PCN reaction causing severe rash involving mucus membranes or skin necrosis: No Has patient had a PCN reaction that required hospitalization: No Has patient had a PCN reaction occurring within the last 10 years: Yes If all of the above answers are "NO", then may proceed with Cephalosporin use.        Medication List        Accurate as of Oct 10, 2021 11:59 PM. If you have any questions, ask your nurse or doctor.          STOP taking these medications    repaglinide 2 MG tablet Commonly known as: PRANDIN Stopped by: Howard Pouch, DO       TAKE these medications    Accu-Chek Guide test strip Generic drug: glucose blood Use 4 times daily as needed or directed.   Accu-Chek Softclix Lancets lancets Use 4 times daily as needed or directed   apixaban 5 MG Tabs tablet Commonly known as: ELIQUIS Take 1 tablet (5 mg total) by mouth 2 (two) times daily.   baclofen 10 MG tablet Commonly known as: LIORESAL Take 0.5-1 tablets (5-10 mg total) by mouth 2 (two) times daily. Started by: Howard Pouch, DO   CALTRATE 600 PO Take 600 mg by mouth 2 (two) times daily.   Clenpiq 10-3.5-12 MG-GM -GM/160ML Soln Generic drug: Sod Picosulfate-Mag  Ox-Cit Acd Take as directed for colonoscopy   fenofibrate 160 MG tablet Take 1 tablet (160 mg total) by mouth daily.   isosorbide mononitrate 30 MG 24 hr tablet Commonly known as: IMDUR Take 0.5 tablets (15 mg total) by mouth daily.   levothyroxine 88 MCG tablet Commonly known as: SYNTHROID Take 88 mcg by mouth every morning. What changed: Another medication with the same name was removed. Continue taking this medication, and follow the directions you see here. Changed by: Howard Pouch, DO   Magnesium 250 MG Tabs Take 1 tablet by mouth daily.   metFORMIN 500 MG tablet Commonly known as: GLUCOPHAGE Take 500 mg by mouth 2 (two) times daily with a meal.   methylPREDNISolone 4 MG Tbpk tablet Commonly known as: MEDROL DOSEPAK Per pack instruction Started by: Howard Pouch, DO   metoCLOPramide 10 MG tablet Commonly known as: Reglan 1 prior to each part of colonoscopy prep   metoprolol tartrate 25 MG tablet Commonly known as: LOPRESSOR Take 1 tablet (25 mg total) by mouth 2 (two) times daily. Take extra dose for break though Afib with increased HR >100   nitroGLYCERIN 0.4 MG SL tablet Commonly known as: NITROSTAT Place 1 tablet (0.4 mg total) under the tongue every 5 (five) minutes as needed for chest pain.   NovoLIN 70/30 ReliOn (70-30) 100 UNIT/ML injection Generic drug: insulin NPH-regular Human Inject into the skin. 25 for breakfast and 15 lunch and 36 supper   pantoprazole 40 MG tablet Commonly known as: PROTONIX Take 1 tablet (40 mg total) by mouth 2 (two) times daily before a meal.   POTASSIUM CITRATE PO Take 99 mg by mouth daily.   PROBIOTIC DAILY PO Take 99 mg by mouth daily.   RELION INSULIN SYR 0.5ML/31G 31G X 5/16" 0.5 ML Misc Generic drug: Insulin Syringe-Needle U-100 2 (two) times  daily. as directed   ReliOn Pen Needles 32G X 4 MM Misc Generic drug: Insulin Pen Needle USE AS DIRECTED ONCE DAILY IN THE EVENING FOR 30 DAYS   Repatha SureClick 737  MG/ML Soaj Generic drug: Evolocumab Inject 1 Dose into the skin every 14 (fourteen) days.        All past medical history, surgical history, allergies, family history, immunizations andmedications were updated in the EMR today and reviewed under the history and medication portions of their EMR.     ROS Negative, with the exception of above mentioned in HPI  Objective:  BP 102/61   Pulse 67   Temp 97.6 F (36.4 C) (Oral)   Ht 5' 5.5" (1.664 m)   Wt 178 lb (80.7 kg)   SpO2 97%   BMI 29.17 kg/m  Body mass index is 29.17 kg/m. Physical Exam Vitals and nursing note reviewed.  Constitutional:      General: She is not in acute distress.    Appearance: Normal appearance. She is normal weight. She is not ill-appearing or toxic-appearing.  Eyes:     Extraocular Movements: Extraocular movements intact.     Conjunctiva/sclera: Conjunctivae normal.     Pupils: Pupils are equal, round, and reactive to light.  Musculoskeletal:     Cervical back: Normal.     Thoracic back: Spasms, tenderness and bony tenderness present. No swelling, edema, deformity, signs of trauma or lacerations. Normal range of motion. Scoliosis present.     Lumbar back: Normal.       Back:     Comments: Red- TTP.  Green: decreased sensation.  Upper arms full range of motion and ABDuction.  Negative Hawkins.  Negative empty can test.  Neurovascularly intact distally.  Skin:    Findings: No rash.  Neurological:     Mental Status: She is alert and oriented to person, place, and time. Mental status is at baseline.  Psychiatric:        Mood and Affect: Mood normal.        Behavior: Behavior normal.        Thought Content: Thought content normal.        Judgment: Judgment normal.     No results found. No results found. No results found for this or any previous visit (from the past 24 hour(s)).  Assessment/Plan: [name] [name] is a 66 y.o. female present for OV for  Acute right-sided thoracic back  pain/Radicular pain in right arm We discussed degenerative disc disease and nerve impingement today.  Her shoulder girdle is strong.  Suspect her discomfort is coming from thoracic radiculopathy/pinched nerve.  She does have some concerning bony tenderness over her thoracic spine. There has been no known injury or overactivity that created discomfort. It would seem reassuring manipulation by chiropractor would relieve symptoms and return sensation temporarily. We will start with x-ray of the thoracic spine and treat the symptoms today. - DG Thoracic Spine W/Swimmers; Future Start Medrol Dosepak Start baclofen 5-10 mg twice daily.  She is very sensitive to medications warned on sedation precautions. NSAIDs as needed. Follow-up in 2 weeks     Reviewed expectations re: course of current medical issues. Discussed self-management of symptoms. Outlined signs and symptoms indicating need for more acute intervention. Patient verbalized understanding and all questions were answered. Patient received an After-Visit Summary.  40 minutes spent providing patient counseling and care.     Orders Placed This Encounter  Procedures   DG Thoracic Spine W/Swimmers   Meds ordered this  encounter  Medications   methylPREDNISolone (MEDROL DOSEPAK) 4 MG TBPK tablet    Sig: Per pack instruction    Dispense:  21 each    Refill:  0   baclofen (LIORESAL) 10 MG tablet    Sig: Take 0.5-1 tablets (5-10 mg total) by mouth 2 (two) times daily.    Dispense:  60 each    Refill:  0   Referral Orders  No referral(s) requested today     Note is dictated utilizing voice recognition software. Although note has been proof read prior to signing, occasional typographical errors still can be missed. If any questions arise, please do not hesitate to call for verification.   electronically signed by:  Howard Pouch, DO  [place]

## 2021-10-11 ENCOUNTER — Telehealth: Payer: Self-pay | Admitting: Family Medicine

## 2021-10-11 DIAGNOSIS — M779 Enthesopathy, unspecified: Secondary | ICD-10-CM

## 2021-10-11 DIAGNOSIS — M4184 Other forms of scoliosis, thoracic region: Secondary | ICD-10-CM | POA: Insufficient documentation

## 2021-10-11 DIAGNOSIS — M5134 Other intervertebral disc degeneration, thoracic region: Secondary | ICD-10-CM | POA: Insufficient documentation

## 2021-10-11 HISTORY — DX: Enthesopathy, unspecified: M77.9

## 2021-10-11 HISTORY — DX: Other forms of scoliosis, thoracic region: M41.84

## 2021-10-11 NOTE — Telephone Encounter (Signed)
Covering preop today. Labs not yet done, ordered 10 days ago. Please let pt know we need these in order to give her GI doctor permission to move forward with procedure in July.

## 2021-10-11 NOTE — Telephone Encounter (Signed)
Please inform patient the following information: Her x-ray resulted with evidence of degeneration of a few of her discs between her thoracic vertebrae and some rather significant bone spurs in the location she is feeling pain.  It is very possible she either has a disc pressing on the nerve or those burned spurs have pinched the nerve creating her discomfort.  I encouraged her to take the steroid Dosepak and and use the baclofen/muscle relaxer as we discussed and follow-up after she gets back from vacation if pain is still present.  Hopefully this will be all she needs, but if pain is still present we will need to consider other medication options and advanced imaging.

## 2021-10-11 NOTE — Telephone Encounter (Signed)
LM asking pt to have her labs done so we can move fwd with getting her cleared for procedure.

## 2021-10-12 ENCOUNTER — Other Ambulatory Visit: Payer: Self-pay

## 2021-10-12 ENCOUNTER — Other Ambulatory Visit: Payer: Medicare Other

## 2021-10-12 DIAGNOSIS — Z01818 Encounter for other preprocedural examination: Secondary | ICD-10-CM

## 2021-10-12 LAB — CBC
Hematocrit: 35 % (ref 34.0–46.6)
Hemoglobin: 12 g/dL (ref 11.1–15.9)
MCH: 28.9 pg (ref 26.6–33.0)
MCHC: 34.3 g/dL (ref 31.5–35.7)
MCV: 84 fL (ref 79–97)
Platelets: 233 10*3/uL (ref 150–450)
RBC: 4.15 x10E6/uL (ref 3.77–5.28)
RDW: 12.5 % (ref 11.7–15.4)
WBC: 6.7 10*3/uL (ref 3.4–10.8)

## 2021-10-12 LAB — COMPREHENSIVE METABOLIC PANEL
ALT: 22 IU/L (ref 0–32)
AST: 17 IU/L (ref 0–40)
Albumin/Globulin Ratio: 1.9 (ref 1.2–2.2)
Albumin: 4.5 g/dL (ref 3.8–4.8)
Alkaline Phosphatase: 61 IU/L (ref 44–121)
BUN/Creatinine Ratio: 17 (ref 12–28)
BUN: 19 mg/dL (ref 8–27)
Bilirubin Total: 0.4 mg/dL (ref 0.0–1.2)
CO2: 24 mmol/L (ref 20–29)
Calcium: 9.8 mg/dL (ref 8.7–10.3)
Chloride: 102 mmol/L (ref 96–106)
Creatinine, Ser: 1.09 mg/dL — ABNORMAL HIGH (ref 0.57–1.00)
Globulin, Total: 2.4 g/dL (ref 1.5–4.5)
Glucose: 192 mg/dL — ABNORMAL HIGH (ref 70–99)
Potassium: 4.8 mmol/L (ref 3.5–5.2)
Sodium: 141 mmol/L (ref 134–144)
Total Protein: 6.9 g/dL (ref 6.0–8.5)
eGFR: 56 mL/min/{1.73_m2} — ABNORMAL LOW (ref >59)

## 2021-10-12 NOTE — Telephone Encounter (Signed)
LVM for pt to CB regarding results.  

## 2021-10-12 NOTE — Telephone Encounter (Signed)
Spoke with pt regarding labs and instructions.   

## 2021-10-15 ENCOUNTER — Telehealth: Payer: Self-pay

## 2021-10-15 NOTE — Telephone Encounter (Signed)
Pt has been made aware she has been cleared and to hold Eliquis x 2 days prior to procedure. Pt will resume Eliquis once performing MD feels it is safe from a bleeding standpoint. Pt thanked me for the call. Pt aware notes faxed to requesting office.

## 2021-10-15 NOTE — Telephone Encounter (Signed)
   Primary Cardiologist: Minus Breeding, MD  Chart reviewed as part of pre-operative protocol coverage. Given past medical history and time since last visit, based on ACC/AHA guidelines, [name] [name] would be at acceptable risk for the planned procedure without further cardiovascular testing.   Please see guidelines for anticoagulation below.  I will route this recommendation to the requesting party via Epic fax function and remove from pre-op pool.  Please call with questions.   Patient with diagnosis of atrial fibrillation on Eliquis for anticoagulation.     Procedure: colonoscopy Date of procedure: 12/04/21     CHA2DS2-VASc Score = 5   This indicates a 7.2% annual risk of stroke. The patient's score is based upon: CHF History: 0 HTN History: 1 Diabetes History: 1 Stroke History: 0 Vascular Disease History: 1 Age Score: 1 Gender Score: 1     CrCl 53             Platelet count 233   Per office protocol, patient can hold Eliquis for 2 days prior to procedure.   Patient will not need bridging with Lovenox (enoxaparin) around procedure  Emmaline Life, NP-C    10/15/2021, 8:02 AM [place] [address], [address] Office [phone] Fax [phone]

## 2021-10-15 NOTE — Telephone Encounter (Signed)
Jordana has been informed to hold her Eliquis the day before and the day of her procedure. Approved by Cardiology. She verbalized understanding.

## 2021-10-15 NOTE — Telephone Encounter (Signed)
Patient with diagnosis of atrial fibrillation on Eliquis for anticoagulation.    Procedure: colonoscopy Date of procedure: 12/04/21   CHA2DS2-VASc Score = 5   This indicates a 7.2% annual risk of stroke. The patient's score is based upon: CHF History: 0 HTN History: 1 Diabetes History: 1 Stroke History: 0 Vascular Disease History: 1 Age Score: 1 Gender Score: 1    CrCl 53  Platelet count 233  Per office protocol, patient can hold Eliquis for 2 days prior to procedure.   Patient will not need bridging with Lovenox (enoxaparin) around procedure.

## 2021-10-24 DIAGNOSIS — Z006 Encounter for examination for normal comparison and control in clinical research program: Secondary | ICD-10-CM

## 2021-10-24 MED ORDER — STUDY - VESALIUS - EVOLOCUMAB (AMG 145) 140 MG/ML OR PLACEBO SQ INJECTION (PI-HILTY): 140.0000 mg | INJECTION | SUBCUTANEOUS | 0 refills | Status: DC

## 2021-10-24 MED ORDER — STUDY - VESALIUS - EVOLOCUMAB (AMG 145) 140 MG/ML OR PLACEBO SQ INJECTION (PI-HILTY)
140.0000 mg | INJECTION | SUBCUTANEOUS | Status: DC
  Administered 2021-10-24: 140 mg via SUBCUTANEOUS
  Filled 2021-10-24: qty 1

## 2021-10-24 NOTE — Research (Addendum)
Patient was seen in clinic today for week 79 of Vesalius study. Reviewed all medications and changes noted, baclofen has been added for back pain on 10/10/2020. She went to see her PCP for back pain that radiates down into her right arm that has been present for about 2 months at that time. Patient states that it has started showing improvement.  PCP thinks it is coming from a pinched nerve or is degenerative disk disease. Denies any adverse events or hospitalizations since last visit. Next appointment for Week 96 is 02/13/2022 @! 9am. [facility] Treatment Compliance Date returned:10/24/21 Box Numbers:2 Number of syringes returned: 7 Number of Full administrations:7  Number of partial administrations:0 Number of non-administrations:0 Number on unknown administrations:0  Reason for any partial or non-administrations:n/a Number due to non-compliance: Number due to device complaint: Number to do other:  IP Dispensation: Date dispensed:10/24/2021 Number dispensed total:8 Number dispensed home to patient:7 Box #:[id number] OEV#:[id number]  IP Administration in Clinic: Box #: [id number] Administration CVEL:[id number] Amount administered (none, partial, full):full Reason for none or partial dose: Administration Site:RUQ Laterally:right Administered by: patient   Current Outpatient Medications:    apixaban (ELIQUIS) 5 MG TABS tablet, Take 1 tablet (5 mg total) by mouth 2 (two) times daily., Disp: 180 tablet, Rfl: 1   baclofen (LIORESAL) 10 MG tablet, Take 0.5-1 tablets (5-10 mg total) by mouth 2 (two) times daily., Disp: 60 each, Rfl: 0   Calcium Carbonate (CALTRATE 600 PO), Take 600 mg by mouth 2 (two) times daily. , Disp: , Rfl:    Evolocumab (REPATHA SURECLICK) 175 MG/ML SOAJ, Inject 1 Dose into the skin every 14 (fourteen) days., Disp: 2 mL, Rfl: 11   fenofibrate 160 MG tablet, Take 1 tablet (160 mg total) by mouth daily., Disp: 90 tablet, Rfl: 3   glucose blood (ACCU-CHEK GUIDE) test strip, Use  4 times daily as needed or directed., Disp: 100 each, Rfl: 3   isosorbide mononitrate (IMDUR) 30 MG 24 hr tablet, Take 0.5 tablets (15 mg total) by mouth daily., Disp: 45 tablet, Rfl: 3   levothyroxine (SYNTHROID) 88 MCG tablet, Take 88 mcg by mouth every morning., Disp: , Rfl:    Magnesium 250 MG TABS, Take 1 tablet by mouth daily., Disp: , Rfl:    metFORMIN (GLUCOPHAGE) 500 MG tablet, Take 500 mg by mouth 2 (two) times daily with a meal., Disp: , Rfl:    metoprolol tartrate (LOPRESSOR) 25 MG tablet, Take 1 tablet (25 mg total) by mouth 2 (two) times daily. Take extra dose for break though Afib with increased HR >100, Disp: 180 tablet, Rfl: 3   nitroGLYCERIN (NITROSTAT) 0.4 MG SL tablet, Place 1 tablet (0.4 mg total) under the tongue every 5 (five) minutes as needed for chest pain., Disp: 25 tablet, Rfl: PRN   NOVOLIN 70/30 RELION (70-30) 100 UNIT/ML injection, Inject into the skin. 25 for breakfast and 15 lunch and 36 supper, Disp: , Rfl:    pantoprazole (PROTONIX) 40 MG tablet, Take 1 tablet (40 mg total) by mouth 2 (two) times daily before a meal., Disp: 180 tablet, Rfl: 3   POTASSIUM CITRATE PO, Take 99 mg by mouth daily. , Disp: , Rfl:    Probiotic Product (PROBIOTIC DAILY PO), Take 99 mg by mouth daily. , Disp: , Rfl:    Study - VESALIUS - evolocumab (AMG 145) 140 mg/mL or placebo SQ injection (PI-Hilty), Inject 1 mL (140 mg total) into the skin every 14 (fourteen) days. Return kit at next visit.If any questions or concerns  occur regarding this medication, please contact [facility]. **For Investigational Drug Use Only**, Disp: 7 mL, Rfl: 0   Accu-Chek Softclix Lancets lancets, Use 4 times daily as needed or directed, Disp: 360 each, Rfl: 3   methylPREDNISolone (MEDROL DOSEPAK) 4 MG TBPK tablet, Per pack instruction (Patient not taking: Reported on 10/24/2021), Disp: 21 each, Rfl: 0   metoCLOPramide (REGLAN) 10 MG tablet, 1 prior to each part of colonoscopy prep (Patient not taking:  Reported on 10/24/2021), Disp: 2 tablet, Rfl: 0   RELION INSULIN SYR 0.5ML/31G 31G X 5/16" 0.5 ML MISC, 2 (two) times daily. as directed (Patient not taking: Reported on 10/24/2021), Disp: , Rfl:    RELION PEN NEEDLES 32G X 4 MM MISC, USE AS DIRECTED ONCE DAILY IN THE EVENING FOR 30 DAYS (Patient not taking: Reported on 10/24/2021), Disp: , Rfl:    Sod Picosulfate-Mag Ox-Cit Acd (CLENPIQ) 10-3.5-12 MG-GM -GM/160ML SOLN, Take as directed for colonoscopy (Patient not taking: Reported on 10/24/2021), Disp: 320 mL, Rfl: 0  Current Facility-Administered Medications:    Study - VESALIUS - evolocumab (AMG 145) 140 mg/mL or placebo SQ injection (PI-Hilty), 140 mg, Subcutaneous, Q14 Days, Hilty, Nadean Corwin, MD, 140 mg at 10/24/21 0945

## 2021-10-24 NOTE — Research (Signed)
[facility] Informed Consent    Subject Name:[name] [name]   Subject met inclusion and exclusion criteria.  The informed consent form, study requirements and expectations were reviewed with the subject and questions and concerns were addressed prior to the signing of the consent form.  The subject verbalized understanding of the trial requirements.  The subject agreed to participate in the [facility]alius trial and signed the informed consent at [place] on 10/24/21.  The informed consent was obtained prior to performance of any protocol-specific procedures for the subject.  A copy of the signed informed consent was given to the subject and a copy was placed in the subject's medical record.    Chanda Busing, Research Coordinator  [facility] VESALI[facility]-CV consent version 6 Protocol Version 6 amendment 4

## 2021-10-26 ENCOUNTER — Ambulatory Visit (INDEPENDENT_AMBULATORY_CARE_PROVIDER_SITE_OTHER): Payer: Medicare Other | Admitting: Family Medicine

## 2021-10-26 ENCOUNTER — Encounter: Payer: Self-pay | Admitting: Family Medicine

## 2021-10-26 VITALS — BP 109/66 | HR 69 | Temp 97.8°F | Ht 65.5 in | Wt 177.0 lb

## 2021-10-26 DIAGNOSIS — M546 Pain in thoracic spine: Secondary | ICD-10-CM | POA: Insufficient documentation

## 2021-10-26 DIAGNOSIS — M5414 Radiculopathy, thoracic region: Secondary | ICD-10-CM | POA: Insufficient documentation

## 2021-10-26 DIAGNOSIS — M779 Enthesopathy, unspecified: Secondary | ICD-10-CM

## 2021-10-26 DIAGNOSIS — M4184 Other forms of scoliosis, thoracic region: Secondary | ICD-10-CM

## 2021-10-26 DIAGNOSIS — R2990 Unspecified symptoms and signs involving the nervous system: Secondary | ICD-10-CM

## 2021-10-26 DIAGNOSIS — M5134 Other intervertebral disc degeneration, thoracic region: Secondary | ICD-10-CM | POA: Diagnosis not present

## 2021-10-26 DIAGNOSIS — R202 Paresthesia of skin: Secondary | ICD-10-CM | POA: Insufficient documentation

## 2021-10-26 HISTORY — DX: Unspecified symptoms and signs involving the nervous system: R29.90

## 2021-10-26 MED ORDER — PREGABALIN 50 MG PO CAPS: 50.0000 mg | ORAL_CAPSULE | Freq: Two times a day (BID) | ORAL | 2 refills | Status: DC

## 2021-10-26 MED ORDER — BACLOFEN 10 MG PO TABS: 5.0000 mg | ORAL_TABLET | Freq: Two times a day (BID) | ORAL | 2 refills | Status: DC

## 2021-10-26 NOTE — Patient Instructions (Signed)
Radicular Pain Radicular pain is a type of pain that spreads from your back or neck along a spinal nerve. Spinal nerves are nerves that leave the spinal cord and go to the muscles. Radicular pain is sometimes called radiculopathy, radiculitis, or a pinched nerve. When you have this type of pain, you may also have weakness, numbness, or tingling in the area of your body that is supplied by the nerve. The pain may feel sharp and burning. Depending on which spinal nerve is affected, the pain may occur in the: Neck area (cervical radicular pain). You may also feel pain, numbness, weakness, or tingling in the arms. Mid-spine area (thoracic radicular pain). You would feel this pain in the back and chest. This type is rare. Lower back area (lumbar radicular pain). You would feel this pain as low back pain. You may feel pain, numbness, weakness, or tingling in the buttocks or legs. Sciatica is a type of lumbar radicular pain that shoots down the back of the leg. Radicular pain occurs when one of the spinal nerves becomes irritated or squeezed (compressed). It is often caused by something pushing on a spinal nerve, such as one of the bones of the spine (vertebrae) or one of the round cushions between vertebrae (intervertebral disks). This can result from: An injury. Wear and tear or aging of a disk. The growth of a bone spur that pushes on the nerve. Radicular pain often goes away when you follow instructions from your health care provider for relieving pain at home. How is this treated? Treatment may depend on the cause of the condition and may include: Working with a physical therapist. Taking pain medicine. Applying heat or ice or both to the affected areas. Doing stretches to improve flexibility. Having surgery. This may be needed if other treatments do not help. Different types of surgery may be done depending on the cause of this condition. Follow these instructions at home: Managing pain     If  directed, put ice on the affected area. To do this: Put ice in a plastic bag. Place a towel between your skin and the bag. Leave the ice on for 20 minutes, 2-3 times a day. Remove the ice if your skin turns bright red. This is very important. If you cannot feel pain, heat, or cold, you have a greater risk of damage to the area. If directed, apply heat to the affected area as often as told by your health care provider. Use the heat source that your health care provider recommends, such as a moist heat pack or a heating pad. Place a towel between your skin and the heat source. Leave the heat on for 20-30 minutes. Remove the heat if your skin turns bright red. This is especially important if you are unable to feel pain, heat, or cold. You have a greater risk of getting burned. Activity Do not sit or rest in bed for long periods of time. Try to stay as active as possible. Ask your health care provider what type of exercise or activity is best for you. Avoid activities that make your pain worse, such as bending and lifting. You may have to avoid lifting. Ask your health care provider how much you can safely lift. Practice using proper technique when lifting items. Proper lifting technique involves bending your knees and rising up. Do strength and range-of-motion exercises only as told by your health care provider or physical therapist. General instructions Take over-the-counter and prescription medicines only as told by your   health care provider. Pay attention to any changes in your symptoms. Keep all follow-up visits. This is important. Contact a health care provider if: Your pain and other symptoms get worse. Your pain medicine is not helping. Your pain has not improved after a few weeks of home care. You have a fever. Get help right away if: You have severe pain, weakness, or numbness. You have difficulty with bladder or bowel control. Summary Radicular pain is a type of pain that spreads  from your back or neck along a spinal nerve. When you have radicular pain, you may also have weakness, numbness, or tingling in the area of your body that is supplied by the nerve. The pain may feel sharp or burning. Radicular pain may be treated with ice, heat, medicines, or physical therapy. This information is not intended to replace advice given to you by your health care provider. Make sure you discuss any questions you have with your health care provider. Document Revised: 11/16/2020 Document Reviewed: 11/16/2020 [employer] Patient Education  2023 [employer]  

## 2021-10-26 NOTE — Progress Notes (Signed)
[name] [name] , [birth year], 66 y.o., female MRN: [id number] Patient Care Team    Relationship Specialty Notifications Start End  Ma Hillock, DO PCP - General Family Medicine  06/03/18   Minus Breeding, MD PCP - Cardiology Cardiology Admissions 11/14/17   Gatha Mayer, MD Consulting Physician Gastroenterology  06/05/18   Marshell Garfinkel, MD Consulting Physician Pulmonary Disease  06/05/18   Jacelyn Pi, MD Referring Physician Endocrinology  09/07/20     Chief Complaint  Patient presents with   Back Pain    Some improvement with meds     Subjective:  [name] [name]  is a 66 y.o. female presents for Back pain: Patient was seen a little over 2 weeks ago for back pain of her thoracic spine that had started to worsen.  She reports the thoracic back pain had been present since November 2022.  She had started seeing a chiropractor routinely for the discomfort, but has not seen much change that is positive.  She noticed approximately 3 weeks ago she is having more increasing pain, pain over the thoracic spine itself and referred pain to her right axilla.  She also endorses some decreased sensation under her right axilla, that she now asked to watch when she is shaving underneath her arm because she cannot feel the razor. Tx: medrol dose pack, baclofen.  She reports the medications helped temporarily, but as soon as the steroid Dosepak was completed her pain returned very quickly. There has been no known injury or overactivity that created discomfort. NSAIDs are contraindicated with chronic anticoagulation.  No results for input(s): HGB, HCT, WBC, PLT in the last 168 hours.     Latest Ref Rng & Units 10/12/2021   10:45 AM 07/19/2021   12:00 AM 09/06/2020   12:16 PM  CMP  Glucose 70 - 99 mg/dL 192    138    BUN 8 - 27 mg/dL 19   21      12     Creatinine 0.57 - 1.00 mg/dL 1.09   1.1      1.04    Sodium 134 - 144 mmol/L 141   142      140    Potassium 3.5 - 5.2 mmol/L 4.8   4.8       4.1    Chloride 96 - 106 mmol/L 102   104      103    CO2 20 - 29 mmol/L 24   25      27     Calcium 8.7 - 10.3 mg/dL 9.8   9.8      9.7    Total Protein 6.0 - 8.5 g/dL 6.9    7.0    Total Bilirubin 0.0 - 1.2 mg/dL 0.4    0.4    Alkaline Phos 44 - 121 IU/L 61   66      62    AST 0 - 40 IU/L 17   24      18     ALT 0 - 32 IU/L 22   30      22        This result is from an external source.       04/04/2021   11:48 AM 09/06/2020   11:25 AM 06/03/2018    4:19 PM 09/06/2014    8:33 AM  Depression screen PHQ 2/9  Decreased Interest 0 0 2 0  Down, Depressed, Hopeless 0 0 1 0  PHQ - 2 Score  0 0 3 0  Altered sleeping   0   Tired, decreased energy   2   Change in appetite   3   Feeling bad or failure about yourself    0   Trouble concentrating   0   Moving slowly or fidgety/restless   0   Suicidal thoughts   0   PHQ-9 Score   8     Allergies  Allergen Reactions   Colesevelam Other (See Comments)   Epinephrine Other (See Comments)    Pass out    Other     Other reaction(s): Myalgia   Statins Other (See Comments)    Myalgias: Simvastatin also caused back ache   Welchol [Colesevelam Hcl]     myalgia   Amoxicillin Itching and Rash    Has patient had a PCN reaction causing immediate rash, facial/tongue/throat swelling, SOB or lightheadedness with hypotension: No Has patient had a PCN reaction causing severe rash involving mucus membranes or skin necrosis: No Has patient had a PCN reaction that required hospitalization: No Has patient had a PCN reaction occurring within the last 10 years: Yes If all of the above answers are "NO", then may proceed with Cephalosporin use.    Social History   Tobacco Use   Smoking status: Never   Smokeless tobacco: Never  Substance Use Topics   Alcohol use: Not Currently   Past Medical History:  Diagnosis Date   Accelerating angina ([place]) 11/04/2017   Asthma    Chronic pain disorder    neck and shoulder   Chronic pain of right upper extremity  11/10/2017   CTS (carpal tunnel syndrome) 01/27/2013   right   Diabetes mellitus type II    Diabetic gastroparesis ([place])    Esophageal reflux 08/25/2013   [place], Dr Barth Kirks   FATTY LIVER DISEASE 04/27/2007   Qualifier: Diagnosis of  By: Danelle Earthly CMA, Darlene     Focal muscle atrophy 09/26/2015   Fundic gland polyps of stomach, benign    Globus sensation 04/13/2018   HTN (hypertension)    Hyperlipidemia, mixed 05/04/2007   Qualifier: Diagnosis of  By: Wynona Luna crestor caused myalgias even at low dose Livalo caused myalgias Lipitor  Mother with severe reaction myalgias Simvastatin, Welchol    Hypokalemia 05/25/2013   Improved stopping HCTZ. Was noted to have an elevated glucose when K was low. Recheck renal next week after starting Maxzide   Hypomagnesemia 08/10/2014   Hypothyroidism    Iron deficiency anemia 08/07/2018   Iron malabsorption 08/07/2018   Laryngitis 08/25/2017   Leg cramps 04/15/2016   Nausea without vomiting 08/08/2014   NONSPEC ELEVATION OF LEVELS OF TRANSAMINASE/LDH 05/01/2007   Qualifier: Diagnosis of  By: Garner Gavel     Osteoarthritis    chronic, right knee (11/04/2017)   OVARIAN CYST 07/11/2009   Qualifier: Diagnosis of  By: Wynona Luna    Ovarian cyst    Plantar fasciitis    Rosacea 10/22/2016   Urine incontinence    Past Surgical History:  Procedure Laterality Date   AUGMENTATION MAMMAPLASTY Bilateral 2002   BLADDER SUSPENSION  1981   COLONOSCOPY     CORONARY STENT INTERVENTION N/A 11/04/2017   Procedure: CORONARY STENT INTERVENTION;  Surgeon: Nelva Bush, MD;  Location: [facility];  Service: Cardiovascular;  Laterality: N/A;   ENDOVENOUS ABLATION SAPHENOUS VEIN W/ LASER Left 12/29/2019   endovenous laser ablation left greater saphenous vein by Ruta Hinds MD    ESOPHAGOGASTRODUODENOSCOPY  INTRAVASCULAR PRESSURE WIRE/FFR STUDY N/A 11/04/2017   Procedure: INTRAVASCULAR PRESSURE WIRE/FFR STUDY;  Surgeon:  Nelva Bush, MD;  Location: [facility];  Service: Cardiovascular;  Laterality: N/A;   INTRAVASCULAR ULTRASOUND/IVUS N/A 11/04/2017   Procedure: Intravascular Ultrasound/IVUS;  Surgeon: Nelva Bush, MD;  Location: [facility];  Service: Cardiovascular;  Laterality: N/A;   KNEE ARTHROSCOPY  1998, [id number]   LAPAROSCOPIC CHOLECYSTECTOMY  2007   LEFT HEART CATH AND CORONARY ANGIOGRAPHY N/A 11/04/2017   Procedure: LEFT HEART CATH AND CORONARY ANGIOGRAPHY;  Surgeon: Nelva Bush, MD;  Location: [facility];  Service: Cardiovascular;  Laterality: N/A;   LEFT HEART CATHETERIZATION WITH CORONARY ANGIOGRAM N/A 11/21/2011   Procedure: LEFT HEART CATHETERIZATION WITH CORONARY ANGIOGRAM;  Surgeon: Josue Hector, MD;  Location: [facility];  Service: Cardiovascular;  Laterality: N/A;   [place]   "still have my ovaries"   Family History  Problem Relation Age of Onset   Alzheimer's disease Mother    Diabetes type II Mother    Hiatal hernia Mother    Diabetes Mother    Emphysema Father    COPD Father    Depression Sister        suicide   Diabetes Sister    Diabetes Daughter    Colon cancer Neg Hx    Stomach cancer Neg Hx    Allergies as of 10/26/2021       Reactions   Colesevelam Other (See Comments)   Epinephrine Other (See Comments)   Pass out    Other    Other reaction(s): Myalgia   Statins Other (See Comments)   Myalgias: Simvastatin also caused back ache   Welchol [colesevelam Hcl]    myalgia   Amoxicillin Itching, Rash   Has patient had a PCN reaction causing immediate rash, facial/tongue/throat swelling, SOB or lightheadedness with hypotension: No Has patient had a PCN reaction causing severe rash involving mucus membranes or skin necrosis: No Has patient had a PCN reaction that required hospitalization: No Has patient had a PCN reaction occurring within the last 10 years: Yes If all of the above answers are  "NO", then may proceed with Cephalosporin use.        Medication List        Accurate as of October 26, 2021  6:07 PM. If you have any questions, ask your nurse or doctor.          STOP taking these medications    Clenpiq 10-3.5-12 MG-GM -GM/160ML Soln Generic drug: Sod Picosulfate-Mag Ox-Cit Acd Stopped by: Howard Pouch, DO   methylPREDNISolone 4 MG Tbpk tablet Commonly known as: MEDROL DOSEPAK Stopped by: Howard Pouch, DO   metoCLOPramide 10 MG tablet Commonly known as: Reglan Stopped by: Howard Pouch, DO   RELION INSULIN SYR 0.5ML/31G 31G X 5/16" 0.5 ML Misc Generic drug: Insulin Syringe-Needle U-100 Stopped by: Howard Pouch, DO   ReliOn Pen Needles 32G X 4 MM Misc Generic drug: Insulin Pen Needle Stopped by: Howard Pouch, DO       TAKE these medications    Accu-Chek Guide test strip Generic drug: glucose blood Use 4 times daily as needed or directed.   Accu-Chek Softclix Lancets lancets Use 4 times daily as needed or directed   apixaban 5 MG Tabs tablet Commonly known as: ELIQUIS Take 1 tablet (5 mg total) by mouth 2 (two) times daily.   baclofen 10 MG tablet Commonly known as: LIORESAL Take 0.5-1 tablets (  5-10 mg total) by mouth 2 (two) times daily.   CALTRATE 600 PO Take 600 mg by mouth 2 (two) times daily.   fenofibrate 160 MG tablet Take 1 tablet (160 mg total) by mouth daily.   isosorbide mononitrate 30 MG 24 hr tablet Commonly known as: IMDUR Take 0.5 tablets (15 mg total) by mouth daily.   levothyroxine 88 MCG tablet Commonly known as: SYNTHROID Take 88 mcg by mouth every morning.   Magnesium 250 MG Tabs Take 1 tablet by mouth daily.   metFORMIN 500 MG tablet Commonly known as: GLUCOPHAGE Take 500 mg by mouth 2 (two) times daily with a meal.   metoprolol tartrate 25 MG tablet Commonly known as: LOPRESSOR Take 1 tablet (25 mg total) by mouth 2 (two) times daily. Take extra dose for break though Afib with increased HR >100    nitroGLYCERIN 0.4 MG SL tablet Commonly known as: NITROSTAT Place 1 tablet (0.4 mg total) under the tongue every 5 (five) minutes as needed for chest pain.   NovoLIN 70/30 ReliOn (70-30) 100 UNIT/ML injection Generic drug: insulin NPH-regular Human Inject into the skin. 25 for breakfast and 15 lunch and 36 supper   pantoprazole 40 MG tablet Commonly known as: PROTONIX Take 1 tablet (40 mg total) by mouth 2 (two) times daily before a meal.   POTASSIUM CITRATE PO Take 99 mg by mouth daily.   pregabalin 50 MG capsule Commonly known as: LYRICA Take 1 capsule (50 mg total) by mouth 2 (two) times daily. Started by: Howard Pouch, DO   PROBIOTIC DAILY PO Take 99 mg by mouth daily.   Repatha SureClick 568 MG/ML Soaj Generic drug: Evolocumab Inject 1 Dose into the skin every 14 (fourteen) days.   VESALIUS evolocumab (AMG 145) or placebo 140 mg/mL SQ injection Inject 1 mL (140 mg total) into the skin every 14 (fourteen) days. Return kit at next visit.If any questions or concerns occur regarding this medication, please contact [facility]. **For Investigational Drug Use Only**        All past medical history, surgical history, allergies, family history, immunizations and medications were updated in the EMR today and reviewed under the history and medication portions of their EMR.      ROS: Negative, with the exception of above mentioned in HPI   Objective:  BP 109/66   Pulse 69   Temp 97.8 F (36.6 C) (Oral)   Ht 5' 5.5" (1.664 m)   Wt 177 lb (80.3 kg)   SpO2 99%   BMI 29.01 kg/m  Body mass index is 29.01 kg/m. Physical Exam Vitals and nursing note reviewed.  Constitutional:      General: She is not in acute distress.    Appearance: Normal appearance. She is normal weight. She is not ill-appearing or toxic-appearing.     Comments: Standing in room.  Not sitting secondary to the discomfort  Eyes:     Extraocular Movements: Extraocular movements intact.      Conjunctiva/sclera: Conjunctivae normal.     Pupils: Pupils are equal, round, and reactive to light.  Musculoskeletal:     Cervical back: Normal.     Thoracic back: Spasms, tenderness and bony tenderness present. Decreased range of motion. Scoliosis present.  Neurological:     Mental Status: She is alert and oriented to person, place, and time. Mental status is at baseline.  Psychiatric:        Mood and Affect: Mood normal.        Behavior: Behavior normal.  Thought Content: Thought content normal.        Judgment: Judgment normal.         Comments:  Red- TTP.  Green: decreased sensation.  Upper arms full range of motion and ABDuction.  Negative Hawkins.  Negative empty can test.  Neurovascularly intact distally.        Assessment/Plan: BRAELEE HERRLE is a 66 y.o. female present for OV for Hospital discharge follow up Thoracic degenerative disc disease/bone spur/dextroscoliosis of thoracic spine/thoracic radiculopathy/neurosensory deficit/paresthesia/right thoracic back pain Pain present 6 to 7 months with worsening neurosensory deficits and pain.  Did not respond to initial treatment of steroids and muscle relaxants. - Ambulatory referral to Orthopedic Surgery - CT THORACIC SPINE WO CONTRAST; Future She does have some concerning bony tenderness over her thoracic spine. X-ray completed last visit was concerning for significant bone spurring with thoracic spine degeneration.  She did not respond well to treatment with steroid pack and muscle relaxer.  Relief was temporary and then quickly returned with neurosensory deficits which are concerning. Discussed moving forward with advanced imaging and referral to orthopedics and she is agreeable to this today. Continue baclofen 5-10 mg twice daily.  She is very sensitive to medications warned on sedation precautions. Lyrica 50 mg twice daily prescribed. Referral to orthopedics-Dr. Ramo's completed today.  She is a patient of  [facility] and her husband sees Dr. Herma Mering. CT thoracic spine without contrast ordered today. Follow-up dependent upon clinical outcome, imaging and if she is able to establish with orthopedic quickly.  Reviewed expectations re: course of current medical issues. Discussed self-management of symptoms. Outlined signs and symptoms indicating need for more acute intervention. Patient verbalized understanding and all questions were answered. Patient received an After-Visit Summary. Any changes in medications were reviewed and patient was provided with updated med list with their AVS.    Meds ordered this encounter  Medications   baclofen (LIORESAL) 10 MG tablet    Sig: Take 0.5-1 tablets (5-10 mg total) by mouth 2 (two) times daily.    Dispense:  60 each    Refill:  2   pregabalin (LYRICA) 50 MG capsule    Sig: Take 1 capsule (50 mg total) by mouth 2 (two) times daily.    Dispense:  60 capsule    Refill:  2    Orders Placed This Encounter  Procedures   CT THORACIC SPINE WO CONTRAST   Ambulatory referral to Orthopedic Surgery     Note is dictated utilizing voice recognition software. Although note has been proof read prior to signing, occasional typographical errors still can be missed. If any questions arise, please do not hesitate to call for verification.   electronically signed by:  Howard Pouch, DO  [place]

## 2021-10-29 ENCOUNTER — Ambulatory Visit (INDEPENDENT_AMBULATORY_CARE_PROVIDER_SITE_OTHER): Payer: Medicare Other

## 2021-10-29 ENCOUNTER — Encounter: Payer: Self-pay | Admitting: Family Medicine

## 2021-10-29 DIAGNOSIS — M4184 Other forms of scoliosis, thoracic region: Secondary | ICD-10-CM

## 2021-10-29 DIAGNOSIS — M779 Enthesopathy, unspecified: Secondary | ICD-10-CM | POA: Diagnosis not present

## 2021-10-29 DIAGNOSIS — M546 Pain in thoracic spine: Secondary | ICD-10-CM

## 2021-10-29 DIAGNOSIS — M5134 Other intervertebral disc degeneration, thoracic region: Secondary | ICD-10-CM | POA: Diagnosis not present

## 2021-10-29 DIAGNOSIS — R202 Paresthesia of skin: Secondary | ICD-10-CM

## 2021-10-29 DIAGNOSIS — M5414 Radiculopathy, thoracic region: Secondary | ICD-10-CM

## 2021-10-29 DIAGNOSIS — R2990 Unspecified symptoms and signs involving the nervous system: Secondary | ICD-10-CM

## 2021-10-29 NOTE — Telephone Encounter (Signed)
[name] [name] [name] [name] - PA Case ID: [id number] - Rx #: [id number]

## 2021-10-30 ENCOUNTER — Telehealth: Payer: Self-pay | Admitting: Family Medicine

## 2021-10-30 DIAGNOSIS — I700 Atherosclerosis of aorta: Secondary | ICD-10-CM | POA: Insufficient documentation

## 2021-10-30 NOTE — Telephone Encounter (Signed)
Please call patient: Her thoracic CT showed that her disc heights were relatively preserved. It did have evidence of scattered thoracic spurring as we discussed during her visit. It was negative for any spinal stenosis or neuroforaminal stenosis which is reassuring.   Please forward to orthopedic team

## 2021-10-31 NOTE — Telephone Encounter (Signed)
LVM for pt to CB regarding results.  

## 2021-10-31 NOTE — Telephone Encounter (Signed)
Spoke with pt regarding labs and instructions.   

## 2021-11-12 ENCOUNTER — Other Ambulatory Visit: Payer: Self-pay | Admitting: Internal Medicine

## 2021-11-12 DIAGNOSIS — K219 Gastro-esophageal reflux disease without esophagitis: Secondary | ICD-10-CM

## 2021-11-13 ENCOUNTER — Telehealth: Payer: Self-pay | Admitting: Internal Medicine

## 2021-11-13 NOTE — Telephone Encounter (Signed)
Patient called requesting a refill of her famotidine to be sent to [facility].  Please advise patient once order has been placed.  Thank you.

## 2021-11-14 NOTE — Telephone Encounter (Signed)
I left [name] [name] a detailed message to call me back. The famotidine is no longer on her med list.

## 2021-11-15 NOTE — Telephone Encounter (Signed)
Left her another message to call or MYCHART me to confirm the medicine she wants sent in.

## 2021-11-29 NOTE — Telephone Encounter (Signed)
I spoke to [name] [name] and she said she thinks she accidentally clicked on the refill button for the famotidine. She is no longer taking it.

## 2021-12-04 ENCOUNTER — Ambulatory Visit (AMBULATORY_SURGERY_CENTER): Payer: Medicare Other | Admitting: Internal Medicine

## 2021-12-04 ENCOUNTER — Encounter: Payer: Self-pay | Admitting: Internal Medicine

## 2021-12-04 ENCOUNTER — Telehealth: Payer: Self-pay | Admitting: *Deleted

## 2021-12-04 VITALS — BP 123/56 | HR 64 | Temp 97.8°F | Resp 14 | Ht 65.0 in | Wt 177.0 lb

## 2021-12-04 DIAGNOSIS — D128 Benign neoplasm of rectum: Secondary | ICD-10-CM

## 2021-12-04 DIAGNOSIS — Z1211 Encounter for screening for malignant neoplasm of colon: Secondary | ICD-10-CM | POA: Diagnosis not present

## 2021-12-04 DIAGNOSIS — D123 Benign neoplasm of transverse colon: Secondary | ICD-10-CM | POA: Diagnosis not present

## 2021-12-04 MED ORDER — SODIUM CHLORIDE 0.9 % IV SOLN: 500.0000 mL | Freq: Once | INTRAVENOUS | Status: DC

## 2021-12-04 NOTE — Progress Notes (Signed)
PT taken to PACU. Monitors in place. VSS. Report given to RN. 

## 2021-12-04 NOTE — Telephone Encounter (Signed)
Patient with diagnosis of afib on Eliquis for anticoagulation.    Procedure: Cervical Selective Nerve Root Block Date of procedure: 12/27/21   CHA2DS2-VASc Score = 5   This indicates a 7.2% annual risk of stroke. The patient's score is based upon: CHF History: 0 HTN History: 1 Diabetes History: 1 Stroke History: 0 Vascular Disease History: 1 Age Score: 1 Gender Score: 1      CrCl 52 ml/min  Per office protocol, patient can hold Eliquis for 3 days prior to procedure.     **This guidance is not considered finalized until pre-operative APP has relayed final recommendations.**

## 2021-12-04 NOTE — Telephone Encounter (Signed)
Primary Cardiologist:James Hochrein, MD   Preoperative team, please contact this patient and set up a phone call appointment for further preoperative risk assessment. Please obtain consent and complete medication review. Thank you for your help.   I confirm that guidance regarding antiplatelet and oral anticoagulation therapy has been completed and, if necessary, noted below.  [name] Life, NP-C    12/04/2021, 1:27 PM [place] [address] Office [phone] Fax [phone]

## 2021-12-04 NOTE — Op Note (Signed)
[place] Patient Name: [name] [name] Procedure Date: 12/04/2021 9:17 AM MRN: [id number] Endoscopist: Gatha Mayer , MD Age: 66 Referring MD:  Date of Birth: 1956-02-27 Gender: Female Account #: [id number] Procedure:                Colonoscopy Indications:              Screening for colorectal malignant neoplasm Medicines:                Monitored Anesthesia Care Procedure:                Pre-Anesthesia Assessment:                           - Prior to the procedure, a History and Physical                            was performed, and patient medications and                            allergies were reviewed. The patient's tolerance of                            previous anesthesia was also reviewed. The risks                            and benefits of the procedure and the sedation                            options and risks were discussed with the patient.                            All questions were answered, and informed consent                            was obtained. Prior Anticoagulants: The patient                            last took Eliquis (apixaban) 2 days prior to the                            procedure. ASA Grade Assessment: III - A patient                            with severe systemic disease. After reviewing the                            risks and benefits, the patient was deemed in                            satisfactory condition to undergo the procedure.                           After obtaining informed consent, the colonoscope  was passed under direct vision. Throughout the                            procedure, the patient's blood pressure, pulse, and                            oxygen saturations were monitored continuously. The                            Olympus CF-HQ190L [id number]) Colonoscope was                            introduced through the anus and advanced to the the                            cecum, identified  by appendiceal orifice and                            ileocecal valve. The colonoscopy was somewhat                            difficult due to significant looping. Successful                            completion of the procedure was aided by applying                            abdominal pressure. The patient tolerated the                            procedure well. The quality of the bowel                            preparation was adequate. The bowel preparation                            used was Clenpiq via split dose instruction. Scope In: 9:34:51 AM Scope Out: 9:51:02 AM Scope Withdrawal Time: 0 hours 11 minutes 28 seconds  Total Procedure Duration: 0 hours 16 minutes 11 seconds  Findings:                 The perianal and digital rectal examinations were                            normal.                           Two sessile polyps were found in the rectum and                            transverse colon. The polyps were diminutive in                            size. These polyps were removed with a cold snare.  Resection and retrieval were complete. Verification                            of patient identification for the specimen was                            done. Estimated blood loss was minimal.                           Internal hemorrhoids were found.                           The exam was otherwise without abnormality on                            direct and retroflexion views. Complications:            No immediate complications. Estimated Blood Loss:     Estimated blood loss was minimal. Impression:               - Two diminutive polyps in the rectum and in the                            transverse colon, removed with a cold snare.                            Resected and retrieved.                           - Internal hemorrhoids.                           - The examination was otherwise normal on direct                            and  retroflexion views. Recommendation:           - Patient has a contact number available for                            emergencies. The signs and symptoms of potential                            delayed complications were discussed with the                            patient. Return to normal activities tomorrow.                            Written discharge instructions were provided to the                            patient.                           - Resume previous diet.                           -  Continue present medications.                           - Resume Eliquis (apixaban) at prior dose tomorrow.                           - Repeat colonoscopy is recommended. The                            colonoscopy date will be determined after pathology                            results from today's exam become available for                            review. Gatha Mayer, MD 12/04/2021 9:58:17 AM This report has been signed electronically.

## 2021-12-04 NOTE — Progress Notes (Signed)
[facility] History and Physical   Primary Care Physician:  Ma Hillock, DO   Reason for Procedure:   Colon cancer screening  Plan:    colonoscopy     HPI: [name] [name] is a 66 y.o. female here for screening colonoscopy. She has held Eliquis (Afib)   Past Medical History:  Diagnosis Date   Accelerating angina ([place]) 11/04/2017   Allergy    Asthma    Atrial fibrillation (HCC)    Cataract    Chronic pain disorder    neck and shoulder   Chronic pain of right upper extremity 11/10/2017   CTS (carpal tunnel syndrome) 01/27/2013   right   Diabetes mellitus type II    Diabetic gastroparesis (HCC)    Esophageal reflux 08/25/2013   [place], Dr Barth Kirks   FATTY LIVER DISEASE 04/27/2007   Qualifier: Diagnosis of  By: Danelle Earthly CMA, Darlene     Focal muscle atrophy 09/26/2015   Fundic gland polyps of stomach, benign    Globus sensation 04/13/2018   HTN (hypertension)    Hyperlipidemia, mixed 05/04/2007   Qualifier: Diagnosis of  By: Wynona Luna crestor caused myalgias even at low dose Livalo caused myalgias Lipitor  Mother with severe reaction myalgias Simvastatin, Welchol    Hypokalemia 05/25/2013   Improved stopping HCTZ. Was noted to have an elevated glucose when K was low. Recheck renal next week after starting Maxzide   Hypomagnesemia 08/10/2014   Hypothyroidism    Iron deficiency anemia 08/07/2018   Iron malabsorption 08/07/2018   Laryngitis 08/25/2017   Leg cramps 04/15/2016   Nausea without vomiting 08/08/2014   NONSPEC ELEVATION OF LEVELS OF TRANSAMINASE/LDH 05/01/2007   Qualifier: Diagnosis of  By: Garner Gavel     Osteoarthritis    chronic, right knee (11/04/2017)   Plantar fasciitis    Rosacea 10/22/2016   Urine incontinence     Past Surgical History:  Procedure Laterality Date   AUGMENTATION MAMMAPLASTY Bilateral 2002   BLADDER SUSPENSION  1981   COLONOSCOPY     CORONARY STENT INTERVENTION N/A 11/04/2017   Procedure:  CORONARY STENT INTERVENTION;  Surgeon: Nelva Bush, MD;  Location: [facility];  Service: Cardiovascular;  Laterality: N/A;   ENDOVENOUS ABLATION SAPHENOUS VEIN W/ LASER Left 12/29/2019   endovenous laser ablation left greater saphenous vein by Ruta Hinds MD    ESOPHAGOGASTRODUODENOSCOPY     INTRAVASCULAR PRESSURE WIRE/FFR STUDY N/A 11/04/2017   Procedure: INTRAVASCULAR PRESSURE WIRE/FFR STUDY;  Surgeon: Nelva Bush, MD;  Location: [facility];  Service: Cardiovascular;  Laterality: N/A;   INTRAVASCULAR ULTRASOUND/IVUS N/A 11/04/2017   Procedure: Intravascular Ultrasound/IVUS;  Surgeon: Nelva Bush, MD;  Location: [facility];  Service: Cardiovascular;  Laterality: N/A;   KNEE ARTHROSCOPY  1998, [id number]   LAPAROSCOPIC CHOLECYSTECTOMY  2007   LEFT HEART CATH AND CORONARY ANGIOGRAPHY N/A 11/04/2017   Procedure: LEFT HEART CATH AND CORONARY ANGIOGRAPHY;  Surgeon: Nelva Bush, MD;  Location: [facility];  Service: Cardiovascular;  Laterality: N/A;   LEFT HEART CATHETERIZATION WITH CORONARY ANGIOGRAM N/A 11/21/2011   Procedure: LEFT HEART CATHETERIZATION WITH CORONARY ANGIOGRAM;  Surgeon: Josue Hector, MD;  [facility];  Service: Cardiovascular;  Laterality: N/A;   [place]   "still have my ovaries"    Prior to Admission medications   Medication Sig Start Date End Date Taking? Authorizing Provider  Accu-Chek Softclix Lancets lancets Use 4 times daily  as needed or directed 03/09/20  Yes Renato Shin, MD  apixaban (ELIQUIS) 5 MG TABS tablet Take 1 tablet (5 mg total) by mouth 2 (two) times daily. 06/13/21  Yes Minus Breeding, MD  baclofen (LIORESAL) 10 MG tablet Take 0.5-1 tablets (5-10 mg total) by mouth 2 (two) times daily. 10/26/21  Yes Kuneff, [name], DO  Calcium Carbonate (CALTRATE 600 PO) Take 600 mg by mouth 2 (two) times daily.    Yes [provider]  fenofibrate 160 MG tablet Take  1 tablet (160 mg total) by mouth daily. 06/26/21  Yes Hochrein, Jeneen Rinks, MD  glucose blood (ACCU-CHEK GUIDE) test strip Use 4 times daily as needed or directed. 03/09/20  Yes Renato Shin, MD  isosorbide mononitrate (IMDUR) 30 MG 24 hr tablet Take 0.5 tablets (15 mg total) by mouth daily. 09/25/21  Yes Minus Breeding, MD  levothyroxine (SYNTHROID) 88 MCG tablet Take 88 mcg by mouth every morning. 09/26/21  Yes [provider]  Magnesium 250 MG TABS Take 1 tablet by mouth daily.   Yes [provider]  metFORMIN (GLUCOPHAGE) 500 MG tablet Take 500 mg by mouth 2 (two) times daily with a meal.   Yes [provider]  metoprolol tartrate (LOPRESSOR) 25 MG tablet Take 1 tablet (25 mg total) by mouth 2 (two) times daily. Take extra dose for break though Afib with increased HR >100 06/26/21  Yes Hochrein, Jeneen Rinks, MD  NOVOLIN 70/30 RELION (70-30) 100 UNIT/ML injection Inject into the skin. 25 for breakfast and 15 lunch and 36 supper 12/16/20  Yes [provider]  pantoprazole (PROTONIX) 40 MG tablet Take 1 tablet (40 mg total) by mouth 2 (two) times daily before a meal. 09/28/21  Yes Gatha Mayer, MD  POTASSIUM CITRATE PO Take 99 mg by mouth daily.    Yes [provider]  pregabalin (LYRICA) 50 MG capsule Take 1 capsule (50 mg total) by mouth 2 (two) times daily. 10/26/21  Yes Kuneff, [name], DO  Probiotic Product (PROBIOTIC DAILY PO) Take 99 mg by mouth daily.    Yes [provider]  Evolocumab (REPATHA SURECLICK) 643 MG/ML SOAJ Inject 1 Dose into the skin every 14 (fourteen) days. 02/07/20   Hilty, Nadean Corwin, MD  nitroGLYCERIN (NITROSTAT) 0.4 MG SL tablet Place 1 tablet (0.4 mg total) under the tongue every 5 (five) minutes as needed for chest pain. 08/02/21   Minus Breeding, MD  Study - VESALIUS - evolocumab (AMG 145) 140 mg/mL or placebo SQ injection (PI-Hilty) Inject 1 mL (140 mg total) into the skin every 14 (fourteen) days. Return kit at next visit.If any  questions or concerns occur regarding this medication, please contact [facility]. **For Investigational Drug Use Only** 10/24/21   Hilty, Nadean Corwin, MD    Current Outpatient Medications  Medication Sig Dispense Refill   Accu-Chek Softclix Lancets lancets Use 4 times daily as needed or directed 360 each 3   apixaban (ELIQUIS) 5 MG TABS tablet Take 1 tablet (5 mg total) by mouth 2 (two) times daily. 180 tablet 1   baclofen (LIORESAL) 10 MG tablet Take 0.5-1 tablets (5-10 mg total) by mouth 2 (two) times daily. 60 each 2   Calcium Carbonate (CALTRATE 600 PO) Take 600 mg by mouth 2 (two) times daily.      fenofibrate 160 MG tablet Take 1 tablet (160 mg total) by mouth daily. 90 tablet 3   glucose blood (ACCU-CHEK GUIDE) test strip Use 4 times daily as needed or directed. 100  each 3   isosorbide mononitrate (IMDUR) 30 MG 24 hr tablet Take 0.5 tablets (15 mg total) by mouth daily. 45 tablet 3   levothyroxine (SYNTHROID) 88 MCG tablet Take 88 mcg by mouth every morning.     Magnesium 250 MG TABS Take 1 tablet by mouth daily.     metFORMIN (GLUCOPHAGE) 500 MG tablet Take 500 mg by mouth 2 (two) times daily with a meal.     metoprolol tartrate (LOPRESSOR) 25 MG tablet Take 1 tablet (25 mg total) by mouth 2 (two) times daily. Take extra dose for break though Afib with increased HR >100 180 tablet 3   NOVOLIN 70/30 RELION (70-30) 100 UNIT/ML injection Inject into the skin. 25 for breakfast and 15 lunch and 36 supper     pantoprazole (PROTONIX) 40 MG tablet Take 1 tablet (40 mg total) by mouth 2 (two) times daily before a meal. 180 tablet 3   POTASSIUM CITRATE PO Take 99 mg by mouth daily.      pregabalin (LYRICA) 50 MG capsule Take 1 capsule (50 mg total) by mouth 2 (two) times daily. 60 capsule 2   Probiotic Product (PROBIOTIC DAILY PO) Take 99 mg by mouth daily.      Evolocumab (REPATHA SURECLICK) 094 MG/ML SOAJ Inject 1 Dose into the skin every 14 (fourteen) days. 2 mL 11   nitroGLYCERIN  (NITROSTAT) 0.4 MG SL tablet Place 1 tablet (0.4 mg total) under the tongue every 5 (five) minutes as needed for chest pain. 25 tablet PRN   Study - VESALIUS - evolocumab (AMG 145) 140 mg/mL or placebo SQ injection (PI-Hilty) Inject 1 mL (140 mg total) into the skin every 14 (fourteen) days. Return kit at next visit.If any questions or concerns occur regarding this medication, please contact [facility]. **For Investigational Drug Use Only** 7 mL 0   Current Facility-Administered Medications  Medication Dose Route Frequency Provider Last Rate Last Admin   0.9 %  sodium chloride infusion  500 mL Intravenous Once Gatha Mayer, MD       Study - VESALIUS - evolocumab (AMG 145) 140 mg/mL or placebo SQ injection (PI-Hilty)  140 mg Subcutaneous Q14 Days Pixie Casino, MD   140 mg at 10/24/21 0945    Allergies as of 12/04/2021 - Review Complete 12/04/2021  Allergen Reaction Noted   Colesevelam Other (See Comments) 07/25/2021   Epinephrine Other (See Comments) 07/25/2021   Other  08/10/2020   Statins Other (See Comments) 08/24/2013   Welchol [colesevelam hcl]  08/24/2013   Amoxicillin Itching and Rash 04/04/2015    Family History  Problem Relation Age of Onset   Alzheimer's disease Mother    Diabetes type II Mother    Hiatal hernia Mother    Diabetes Mother    Emphysema Father    COPD Father    Depression Sister        suicide   Diabetes Sister    Diabetes Daughter    Colon cancer Neg Hx    Stomach cancer Neg Hx    Esophageal cancer Neg Hx    Rectal cancer Neg Hx     Social History   Socioeconomic History   Marital status: Married    Spouse name: Not on file   Number of children: 1   Years of education: Not on file   Highest education level: Not on file  Occupational History   Occupation: [employer]  Tobacco Use   Smoking status: Never   Smokeless tobacco:  Never  Vaping Use   Vaping Use: Never used  Substance and Sexual Activity   Alcohol  use: Not Currently   Drug use: Never   Sexual activity: Not Currently    Partners: Male  Other Topics Concern   Not on file  Social History Narrative   Marital status/children/pets: Married, 1 child.    Education/employment: HS   Safety:      -smoke alarm in the home:Yes     - wears seatbelt: Yes     - Feels safe in their relationships: Yes   Social Determinants of Health   Financial Resource Strain: Low Risk  (04/04/2021)   Overall Financial Resource Strain (CARDIA)    Difficulty of Paying Living Expenses: Not hard at all  Food Insecurity: No Food Insecurity (04/04/2021)   Hunger Vital Sign    Worried About Running Out of Food in the Last Year: Never true    Ran Out of Food in the Last Year: Never true  Transportation Needs: No Transportation Needs (04/04/2021)   PRAPARE - Hydrologist (Medical): No    Lack of Transportation (Non-Medical): No  Physical Activity: Inactive (04/04/2021)   Exercise Vital Sign    Days of Exercise per Week: 0 days    Minutes of Exercise per Session: 0 min  Stress: No Stress Concern Present (04/04/2021)   [place]    Feeling of Stress : Only a little  Social Connections: Moderately Isolated (04/04/2021)   Social Connection and Isolation Panel [NHANES]    Frequency of Communication with Friends and Family: More than three times a week    Frequency of Social Gatherings with Friends and Family: More than three times a week    Attends Religious Services: Never    Marine scientist or Organizations: No    Attends Archivist Meetings: Never    Marital Status: Married  Human resources officer Violence: Not At Risk (04/04/2021)   Humiliation, Afraid, Rape, and Kick questionnaire    Fear of Current or Ex-Partner: No    Emotionally Abused: No    Physically Abused: No    Sexually Abused: No    Review of Systems:  All other review of systems negative  except as mentioned in the HPI.  Physical Exam: Vital signs BP 123/63   Pulse 71   Temp 97.8 F (36.6 C) (Temporal)   Ht 5' 5"  (1.651 m)   Wt 177 lb (80.3 kg)   SpO2 98%   BMI 29.45 kg/m   General:   Alert,  Well-developed, well-nourished, pleasant and cooperative in NAD Lungs:  Clear throughout to auscultation.   Heart:  A fib rhythm; no murmurs, clicks, rubs,  or gallops. Abdomen:  Soft, nontender and nondistended. Normal bowel sounds.   Neuro/Psych:  Alert and cooperative. Normal mood and affect. A and O x 3   @Chace Bisch  Simonne Maffucci, MD, [facility] [phone] (pager) 12/04/2021 9:20 AM@

## 2021-12-04 NOTE — Telephone Encounter (Signed)
Pt agreeable to plan of care for tele visit for pre op clearance 12/14/21 @ 10 am. Med rec and consent are done.

## 2021-12-04 NOTE — Telephone Encounter (Signed)
   Pre-operative Risk Assessment    Patient Name: [name] [name]  DOB: 27-Dec-1955 MRN: [id number]      Request for Surgical Clearance    Procedure:   Cervical Selective Nerve Root Block  Date of Surgery:  Clearance 12/27/21                                 Surgeon:  DR. Nelva Bush Surgeon's Group or Practice Name:  [facility] Phone number:  [phone] Fax number:  [phone]   Type of Clearance Requested:   - Pharmacy:  Hold Apixaban (Eliquis) X'S 3 DAYS   Type of Anesthesia:  Not Indicated   Additional requests/questions:    Astrid Divine   12/04/2021, 8:01 AM

## 2021-12-04 NOTE — Progress Notes (Signed)
Called to room to assist during endoscopic procedure.  Patient ID and intended procedure confirmed with present staff. Received instructions for my participation in the procedure from the performing physician.  

## 2021-12-04 NOTE — Patient Instructions (Addendum)
I found and removed 2 tiny polyps and I saw some swollen hemorrhoids.  I will let you know pathology results and when to have another routine colonoscopy by mail and/or My Chart.  Restart Eliquis tomorrow.  I appreciate the opportunity to care for you. Gatha Mayer, MD, [facility]   Discharge instructions given. Handouts on polyps and hemorrhoids. Resume Eliquis at prior dose tomorrow. Resume previous medications. YOU HAD AN ENDOSCOPIC PROCEDURE TODAY AT [facility]:   Refer to the procedure report that was given to you for any specific questions about what was found during the examination.  If the procedure report does not answer your questions, please call your gastroenterologist to clarify.  If you requested that your care partner not be given the details of your procedure findings, then the procedure report has been included in a sealed envelope for you to review at your convenience later.  YOU SHOULD EXPECT: Some feelings of bloating in the abdomen. Passage of more gas than usual.  Walking can help get rid of the air that was put into your GI tract during the procedure and reduce the bloating. If you had a lower endoscopy (such as a colonoscopy or flexible sigmoidoscopy) you may notice spotting of blood in your stool or on the toilet paper. If you underwent a bowel prep for your procedure, you may not have a normal bowel movement for a few days.  Please Note:  You might notice some irritation and congestion in your nose or some drainage.  This is from the oxygen used during your procedure.  There is no need for concern and it should clear up in a day or so.  SYMPTOMS TO REPORT IMMEDIATELY:  Following lower endoscopy (colonoscopy or flexible sigmoidoscopy):  Excessive amounts of blood in the stool  Significant tenderness or worsening of abdominal pains  Swelling of the abdomen that is new, acute  Fever of 100F or higher   For urgent or emergent issues, a  gastroenterologist can be reached at any hour by calling [phone]. Do not use MyChart messaging for urgent concerns.    DIET:  We do recommend a small meal at first, but then you may proceed to your regular diet.  Drink plenty of fluids but you should avoid alcoholic beverages for 24 hours.  ACTIVITY:  You should plan to take it easy for the rest of today and you should NOT DRIVE or use heavy machinery until tomorrow (because of the sedation medicines used during the test).    FOLLOW UP: Our staff will call the number listed on your records the next business day following your procedure.  We will call around 7:15- 8:00 am to check on you and address any questions or concerns that you may have regarding the information given to you following your procedure. If we do not reach you, we will leave a message.  If you develop any symptoms (ie: fever, flu-like symptoms, shortness of breath, cough etc.) before then, please call [phone].  If you test positive for Covid 19 in the 2 weeks post procedure, please call and report this information to [place].    If any biopsies were taken you will be contacted by phone or by letter within the next 1-3 weeks.  Please call us at [phone] if you have not heard about the biopsies in 3 weeks.    SIGNATURES/CONFIDENTIALITY: You and/or your care partner have signed paperwork which will be entered into your electronic medical record.  These  signatures attest to the fact that that the information above on your After Visit Summary has been reviewed and is understood.  Full responsibility of the confidentiality of this discharge information lies with you and/or your care-partner.

## 2021-12-04 NOTE — Telephone Encounter (Signed)
Pt agreeable to plan of care for tele visit for pre op clearance 12/14/21 @ 10 am. Med rec and consent are done.     Patient Consent for Virtual Visit        [name] [name] has provided verbal consent on 12/04/2021 for a virtual visit (video or telephone).   CONSENT FOR VIRTUAL VISIT FOR:  [name] [name]  By participating in this virtual visit I agree to the following:  I hereby voluntarily request, consent and authorize [facility] and its employed or contracted physicians, physician assistants, nurse practitioners or other licensed health care professionals (the Practitioner), to provide me with telemedicine health care services (the "Services") as deemed necessary by the treating Practitioner. I acknowledge and consent to receive the Services by the Practitioner via telemedicine. I understand that the telemedicine visit will involve communicating with the Practitioner through live audiovisual communication technology and the disclosure of certain medical information by electronic transmission. I acknowledge that I have been given the opportunity to request an in-person assessment or other available alternative prior to the telemedicine visit and am voluntarily participating in the telemedicine visit.  I understand that I have the right to withhold or withdraw my consent to the use of telemedicine in the course of my care at any time, without affecting my right to future care or treatment, and that the Practitioner or I may terminate the telemedicine visit at any time. I understand that I have the right to inspect all information obtained and/or recorded in the course of the telemedicine visit and may receive copies of available information for a reasonable fee.  I understand that some of the potential risks of receiving the Services via telemedicine include:  Delay or interruption in medical evaluation due to technological equipment failure or disruption; Information transmitted may not be  sufficient (e.g. poor resolution of images) to allow for appropriate medical decision making by the Practitioner; and/or  In rare instances, security protocols could fail, causing a breach of personal health information.  Furthermore, I acknowledge that it is my responsibility to provide information about my medical history, conditions and care that is complete and accurate to the best of my ability. I acknowledge that Practitioner's advice, recommendations, and/or decision may be based on factors not within their control, such as incomplete or inaccurate data provided by me or distortions of diagnostic images or specimens that may result from electronic transmissions. I understand that the practice of medicine is not an exact science and that Practitioner makes no warranties or guarantees regarding treatment outcomes. I acknowledge that a copy of this consent can be made available to me via my patient portal ([facility]), or I can request a printed copy by calling the office of [facility].    I understand that my insurance will be billed for this visit.   I have read or had this consent read to me. I understand the contents of this consent, which adequately explains the benefits and risks of the Services being provided via telemedicine.  I have been provided ample opportunity to ask questions regarding this consent and the Services and have had my questions answered to my satisfaction. I give my informed consent for the services to be provided through the use of telemedicine in my medical care

## 2021-12-05 ENCOUNTER — Telehealth: Payer: Self-pay | Admitting: *Deleted

## 2021-12-05 NOTE — Telephone Encounter (Signed)
  Follow up Call-     12/04/2021    8:45 AM  Call back number  Post procedure Call Back phone  # [phone]  Permission to leave phone message Yes     Patient questions:  Do you have a fever, pain , or abdominal swelling? No. Pain Score  0 *  Have you tolerated food without any problems? Yes.    Have you been able to return to your normal activities? Yes.    Do you have any questions about your discharge instructions: Diet   No. Medications  No. Follow up visit  No.  Do you have questions or concerns about your Care? No.  Actions: * If pain score is 4 or above: No action needed, pain <4.

## 2021-12-10 ENCOUNTER — Encounter: Payer: Self-pay | Admitting: Internal Medicine

## 2021-12-10 DIAGNOSIS — Z860101 Personal history of adenomatous and serrated colon polyps: Secondary | ICD-10-CM

## 2021-12-10 DIAGNOSIS — Z86010 Personal history of colonic polyps: Secondary | ICD-10-CM

## 2021-12-10 HISTORY — DX: Personal history of adenomatous and serrated colon polyps: Z86.0101

## 2021-12-14 ENCOUNTER — Encounter: Payer: Self-pay | Admitting: Nurse Practitioner

## 2021-12-14 ENCOUNTER — Ambulatory Visit (INDEPENDENT_AMBULATORY_CARE_PROVIDER_SITE_OTHER): Payer: Medicare Other | Admitting: Nurse Practitioner

## 2021-12-14 DIAGNOSIS — Z01810 Encounter for preprocedural cardiovascular examination: Secondary | ICD-10-CM | POA: Diagnosis not present

## 2021-12-14 NOTE — Progress Notes (Signed)
Virtual Visit via Telephone Note   Because of [name] [name]'s co-morbid illnesses, she is at least at moderate risk for complications without adequate follow up.  This format is felt to be most appropriate for this patient at this time.  The patient did not have access to video technology/had technical difficulties with video requiring transitioning to audio format only (telephone).  All issues noted in this document were discussed and addressed.  No physical exam could be performed with this format.  Please refer to the patient's chart for her consent to telehealth for [facility].  Evaluation Performed:  Preoperative cardiovascular risk assessment _____________   Date:  12/14/2021   Patient ID:  [name] [name], [name] [name] [birth year], MRN [id number] Patient Location:  Home Provider location:   Office  Primary Care Provider:  Ma Hillock, DO Primary Cardiologist:  Minus Breeding, MD  Chief Complaint / Patient Profile   66 y.o. y/o female with a h/o CAD s/p DES to ostial/proximal LAD in 10/2017 with occlusion of D1 branch, PAF on chronic anticoagulation, hypertension, dyslipidemia, who is pending cervical selective nerve root block and presents today for telephonic preoperative cardiovascular risk assessment.  Past Medical History    Past Medical History:  Diagnosis Date   Accelerating angina ([place]) 11/04/2017   Allergy    Asthma    Atrial fibrillation (HCC)    Cataract    Chronic pain disorder    neck and shoulder   Chronic pain of right upper extremity 11/10/2017   CTS (carpal tunnel syndrome) 01/27/2013   right   Diabetes mellitus type II    Diabetic gastroparesis (HCC)    Esophageal reflux 08/25/2013   [place], Dr Barth Kirks   FATTY LIVER DISEASE 04/27/2007   Qualifier: Diagnosis of  By: Danelle Earthly CMA, Darlene     Focal muscle atrophy 09/26/2015   Fundic gland polyps of stomach, benign    Globus sensation 04/13/2018   HTN (hypertension)    Hx of adenomatous  colonic polyps 12/10/2021   Hyperlipidemia, mixed 05/04/2007   Qualifier: Diagnosis of  By: Wynona Luna crestor caused myalgias even at low dose Livalo caused myalgias Lipitor  Mother with severe reaction myalgias Simvastatin, Welchol    Hypokalemia 05/25/2013   Improved stopping HCTZ. Was noted to have an elevated glucose when K was low. Recheck renal next week after starting Maxzide   Hypomagnesemia 08/10/2014   Hypothyroidism    Iron deficiency anemia 08/07/2018   Iron malabsorption 08/07/2018   Laryngitis 08/25/2017   Leg cramps 04/15/2016   Nausea without vomiting 08/08/2014   NONSPEC ELEVATION OF LEVELS OF TRANSAMINASE/LDH 05/01/2007   Qualifier: Diagnosis of  By: Garner Gavel     Osteoarthritis    chronic, right knee (11/04/2017)   Plantar fasciitis    Rosacea 10/22/2016   Urine incontinence    Past Surgical History:  Procedure Laterality Date   AUGMENTATION MAMMAPLASTY Bilateral 2002   BLADDER SUSPENSION  1981   COLONOSCOPY     CORONARY STENT INTERVENTION N/A 11/04/2017   Procedure: CORONARY STENT INTERVENTION;  Surgeon: Nelva Bush, MD;  Location: [place] [facility];  Service: Cardiovascular;  Laterality: N/A;   ENDOVENOUS ABLATION SAPHENOUS VEIN W/ LASER Left 12/29/2019   endovenous laser ablation left greater saphenous vein by Ruta Hinds MD    ESOPHAGOGASTRODUODENOSCOPY     INTRAVASCULAR PRESSURE WIRE/FFR STUDY N/A 11/04/2017   Procedure: INTRAVASCULAR PRESSURE WIRE/FFR STUDY;  Surgeon: Nelva Bush, MD;  Location: [place] [facility];  Service: Cardiovascular;  Laterality: N/A;   INTRAVASCULAR ULTRASOUND/IVUS N/A 11/04/2017   Procedure: Intravascular Ultrasound/IVUS;  Surgeon: Nelva Bush, MD;  Location: [place] [facility];  Service: Cardiovascular;  Laterality: N/A;   KNEE ARTHROSCOPY  1998, [id number]   LAPAROSCOPIC CHOLECYSTECTOMY  2007   LEFT HEART CATH AND CORONARY ANGIOGRAPHY N/A 11/04/2017   Procedure: LEFT HEART CATH AND CORONARY ANGIOGRAPHY;   Surgeon: Nelva Bush, MD;  Location: [place] [facility];  Service: Cardiovascular;  Laterality: N/A;   LEFT HEART CATHETERIZATION WITH CORONARY ANGIOGRAM N/A 11/21/2011   Procedure: LEFT HEART CATHETERIZATION WITH CORONARY ANGIOGRAM;  Surgeon: Josue Hector, MD;  Location: [facility];  Service: Cardiovascular;  Laterality: N/A;   [place]   "still have my ovaries"    Allergies  Allergies  Allergen Reactions   Colesevelam Other (See Comments)   Epinephrine Other (See Comments)    Pass out    Other     Other reaction(s): Myalgia   Statins Other (See Comments)    Myalgias: Simvastatin also caused back ache   Welchol [Colesevelam Hcl]     myalgia   Amoxicillin Itching and Rash    Has patient had a PCN reaction causing immediate rash, facial/tongue/throat swelling, SOB or lightheadedness with hypotension: No Has patient had a PCN reaction causing severe rash involving mucus membranes or skin necrosis: No Has patient had a PCN reaction that required hospitalization: No Has patient had a PCN reaction occurring within the last 10 years: Yes If all of the above answers are "NO", then may proceed with Cephalosporin use.     History of Present Illness    [name] [name] is a 66 y.o. female who presents via audio/video conferencing for a telehealth visit today.  Pt was last seen in cardiology clinic on 07/31/21 by Dr. Percival Spanish.  At that time [name] [name] was doing well.  The patient is now pending procedure as outlined above. Since her last visit, she denies chest pain, shortness of breath, lower extremity edema, fatigue, palpitations, melena, hematuria, hemoptysis, diaphoresis, weakness, presyncope, syncope, orthopnea, and PND.   Home Medications    Prior to Admission medications   Medication Sig Start Date End Date Taking? Authorizing Provider  Accu-Chek Softclix Lancets lancets Use 4 times daily as needed or directed 03/09/20   Renato Shin, MD  apixaban (ELIQUIS) 5 MG TABS tablet Take 1 tablet (5 mg total) by mouth 2 (two) times daily. 06/13/21   Minus Breeding, MD  baclofen (LIORESAL) 10 MG tablet Take 0.5-1 tablets (5-10 mg total) by mouth 2 (two) times daily. 10/26/21   Kuneff, [name], DO  Calcium Carbonate (CALTRATE 600 PO) Take 600 mg by mouth 2 (two) times daily.     [provider]  Evolocumab (REPATHA SURECLICK) 440 MG/ML SOAJ Inject 1 Dose into the skin every 14 (fourteen) days. 02/07/20   Hilty, Nadean Corwin, MD  fenofibrate 160 MG tablet Take 1 tablet (160 mg total) by mouth daily. 06/26/21   Minus Breeding, MD  glucose blood (ACCU-CHEK GUIDE) test strip Use 4 times daily as needed or directed. 03/09/20   Renato Shin, MD  isosorbide mononitrate (IMDUR) 30 MG 24 hr tablet Take 0.5 tablets (15 mg total) by mouth daily. 09/25/21   Minus Breeding, MD  levothyroxine (SYNTHROID) 88 MCG tablet Take 88 mcg by mouth every morning. 09/26/21   [provider]  Magnesium 250 MG TABS Take 1 tablet by mouth daily.    [provider]  metFORMIN (GLUCOPHAGE) 500 MG tablet Take 500 mg by mouth 2 (two) times daily with a meal.    [provider]  metoprolol tartrate (LOPRESSOR) 25 MG tablet Take 1 tablet (25 mg total) by mouth 2 (two) times daily. Take extra dose for break though Afib with increased HR >100 06/26/21   Minus Breeding, MD  nitroGLYCERIN (NITROSTAT) 0.4 MG SL tablet Place 1 tablet (0.4 mg total) under the tongue every 5 (five) minutes as needed for chest pain. 08/02/21   Minus Breeding, MD  NOVOLIN 70/30 RELION (70-30) 100 UNIT/ML injection Inject into the skin. 25 for breakfast and 15 lunch and 36 supper 12/16/20   [provider]  pantoprazole (PROTONIX) 40 MG tablet Take 1 tablet (40 mg total) by mouth 2 (two) times daily before a meal. 09/28/21   Gatha Mayer, MD  POTASSIUM CITRATE PO Take 99 mg by mouth daily.     [provider]  pregabalin (LYRICA) 50 MG capsule Take 1  capsule (50 mg total) by mouth 2 (two) times daily. 10/26/21   Kuneff, [name], DO  Probiotic Product (PROBIOTIC DAILY PO) Take 99 mg by mouth daily.     [provider]  Study - VESALIUS - evolocumab (AMG 145) 140 mg/mL or placebo SQ injection (PI-Hilty) Inject 1 mL (140 mg total) into the skin every 14 (fourteen) days. Return kit at next visit.If any questions or concerns occur regarding this medication, please contact [facility]. **For Investigational Drug Use Only** 10/24/21   Hilty, Nadean Corwin, MD    Physical Exam    Vital Signs:  [name] [name] does not have vital signs available for review today.  Given telephonic nature of communication, physical exam is limited. AAOx3. NAD. Normal affect.  Speech and respirations are unlabored.  Accessory Clinical Findings    None  Assessment & Plan    1.  Preoperative Cardiovascular Risk Assessment: The patient is doing well from a cardiac perspective. Therefore, based on ACC/AHA guidelines, the patient would be at acceptable risk for the planned procedure without further cardiovascular testing. The patient was advised that if he develops new symptoms prior to surgery to contact our office to arrange for a follow-up visit, and he verbalized understanding. According to the Revised Cardiac Risk Index (RCRI), her Perioperative Risk of Major Cardiac Event is (%): 0.9. Her Functional Capacity in METs is: 6.61 according to the Duke Activity Status Index (DASI). Per office protocol, patient can hold Eliquis for 3 days prior to procedure.     A copy of this note will be routed to requesting surgeon.  Time:   Today, I have spent 10 minutes with the patient with telehealth technology discussing medical history, symptoms, and management plan.     Emmaline Life, NP-C    12/14/2021, 9:25 AM [place] [address] Office [phone] Fax [phone]

## 2021-12-24 ENCOUNTER — Encounter: Payer: Self-pay | Admitting: Family Medicine

## 2021-12-24 ENCOUNTER — Ambulatory Visit (INDEPENDENT_AMBULATORY_CARE_PROVIDER_SITE_OTHER): Payer: Medicare Other | Admitting: Family Medicine

## 2021-12-24 VITALS — BP 104/62 | HR 59 | Temp 97.6°F | Ht 65.0 in | Wt 180.0 lb

## 2021-12-24 DIAGNOSIS — H00011 Hordeolum externum right upper eyelid: Secondary | ICD-10-CM | POA: Diagnosis not present

## 2021-12-24 HISTORY — DX: Hordeolum externum right upper eyelid: H00.011

## 2021-12-24 MED ORDER — ERYTHROMYCIN 5 MG/GM OP OINT: 1.0000 | TOPICAL_OINTMENT | Freq: Every day | OPHTHALMIC | 0 refills | Status: AC

## 2021-12-24 NOTE — Progress Notes (Signed)
[name] [name] , [birth year], 66 y.o., female MRN: [id number] Patient Care Team    Relationship Specialty Notifications Start End  Ma Hillock, DO PCP - General Family Medicine  06/03/18   Minus Breeding, MD PCP - Cardiology Cardiology Admissions 11/14/17   Gatha Mayer, MD Consulting Physician Gastroenterology  06/05/18   Marshell Garfinkel, MD Consulting Physician Pulmonary Disease  06/05/18   Jacelyn Pi, MD Referring Physician Endocrinology  09/07/20   Susa Day, MD Consulting Physician Orthopedic Surgery  10/31/21     Chief Complaint  Patient presents with   Eye Drainage    [facility] and was dx with Hordeolum internum of right upper eyelid; pt c/o "white spots" on eyelid and itching x 2 weeks; pt states sx are improving since seen at [facility];      Subjective: Pt presents for an OV with complaints of stye of her right upper eyelid of 2 weeks duration.  [facility] eye drops. It has improved a little, but not resolved. Still red, painful , and has a small white spot near it.      04/04/2021   11:48 AM 09/06/2020   11:25 AM 06/03/2018    4:19 PM 09/06/2014    8:33 AM  Depression screen PHQ 2/9  Decreased Interest 0 0 2 0  Down, Depressed, Hopeless 0 0 1 0  PHQ - 2 Score 0 0 3 0  Altered sleeping   0   Tired, decreased energy   2   Change in appetite   3   Feeling bad or failure about yourself    0   Trouble concentrating   0   Moving slowly or fidgety/restless   0   Suicidal thoughts   0   PHQ-9 Score   8     Allergies  Allergen Reactions   Colesevelam Other (See Comments)   Epinephrine Other (See Comments)    Pass out    Other     Other reaction(s): Myalgia   Statins Other (See Comments)    Myalgias: Simvastatin also caused back ache   Welchol [Colesevelam Hcl]     myalgia   Amoxicillin Itching and Rash    Has patient had a PCN reaction causing immediate rash, facial/tongue/throat swelling, SOB or  lightheadedness with hypotension: No Has patient had a PCN reaction causing severe rash involving mucus membranes or skin necrosis: No Has patient had a PCN reaction that required hospitalization: No Has patient had a PCN reaction occurring within the last 10 years: Yes If all of the above answers are "NO", then may proceed with Cephalosporin use.    Social History   Social History Narrative   Marital status/children/pets: Married, 1 child.    Education/employment: HS   Safety:      -smoke alarm in the home:Yes     - wears seatbelt: Yes     - Feels safe in their relationships: Yes   Past Medical History:  Diagnosis Date   Accelerating angina ([place]) 11/04/2017   Allergy    Asthma    Atrial fibrillation (HCC)    Cataract    Chronic pain disorder    neck and shoulder   Chronic pain of right upper extremity 11/10/2017   CTS (carpal tunnel syndrome) 01/27/2013   right   Diabetes mellitus type II    Diabetic gastroparesis (HCC)    Esophageal reflux 08/25/2013   [facility] [place], Dr Barth Kirks   FATTY LIVER DISEASE 04/27/2007   Qualifier: Diagnosis of  By: Danelle Earthly CMA, Darlene     Focal muscle atrophy 09/26/2015   Fundic gland polyps of stomach, benign    Globus sensation 04/13/2018   HTN (hypertension)    Hx of adenomatous colonic polyps 12/10/2021   Hyperlipidemia, mixed 05/04/2007   Qualifier: Diagnosis of  By: Wynona Luna crestor caused myalgias even at low dose Livalo caused myalgias Lipitor  Mother with severe reaction myalgias Simvastatin, Welchol    Hypokalemia 05/25/2013   Improved stopping HCTZ. Was noted to have an elevated glucose when K was low. Recheck renal next week after starting Maxzide   Hypomagnesemia 08/10/2014   Hypothyroidism    Iron deficiency anemia 08/07/2018   Iron malabsorption 08/07/2018   Laryngitis 08/25/2017   Leg cramps 04/15/2016   Nausea without vomiting 08/08/2014   NONSPEC ELEVATION OF LEVELS OF TRANSAMINASE/LDH 05/01/2007    Qualifier: Diagnosis of  By: Garner Gavel     Osteoarthritis    chronic, right knee (11/04/2017)   Plantar fasciitis    Rosacea 10/22/2016   Urine incontinence    Past Surgical History:  Procedure Laterality Date   AUGMENTATION MAMMAPLASTY Bilateral 2002   BLADDER SUSPENSION  1981   COLONOSCOPY     CORONARY STENT INTERVENTION N/A 11/04/2017   Procedure: CORONARY STENT INTERVENTION;  Surgeon: Nelva Bush, MD;  Location: [place] [facility];  Service: Cardiovascular;  Laterality: N/A;   ENDOVENOUS ABLATION SAPHENOUS VEIN W/ LASER Left 12/29/2019   endovenous laser ablation left greater saphenous vein by Ruta Hinds MD    ESOPHAGOGASTRODUODENOSCOPY     INTRAVASCULAR PRESSURE WIRE/FFR STUDY N/A 11/04/2017   Procedure: INTRAVASCULAR PRESSURE WIRE/FFR STUDY;  Surgeon: Nelva Bush, MD;  Location: [place] [facility];  Service: Cardiovascular;  Laterality: N/A;   INTRAVASCULAR ULTRASOUND/IVUS N/A 11/04/2017   Procedure: Intravascular Ultrasound/IVUS;  Surgeon: Nelva Bush, MD;  Location: [place] [facility];  Service: Cardiovascular;  Laterality: N/A;   KNEE ARTHROSCOPY  1998, [id number]   LAPAROSCOPIC CHOLECYSTECTOMY  2007   LEFT HEART CATH AND CORONARY ANGIOGRAPHY N/A 11/04/2017   Procedure: LEFT HEART CATH AND CORONARY ANGIOGRAPHY;  Surgeon: Nelva Bush, MD;  Location: [place] [facility];  Service: Cardiovascular;  Laterality: N/A;   LEFT HEART CATHETERIZATION WITH CORONARY ANGIOGRAM N/A 11/21/2011   Procedure: LEFT HEART CATHETERIZATION WITH CORONARY ANGIOGRAM;  Surgeon: Josue Hector, MD;  Location: [facility] LAB;  Service: Cardiovascular;  Laterality: N/A;   [place]   "still have my ovaries"   Family History  Problem Relation Age of Onset   Alzheimer's disease Mother    Diabetes type II Mother    Hiatal hernia Mother    Diabetes Mother    Emphysema Father    COPD Father    Depression Sister        suicide   Diabetes Sister     Diabetes Daughter    Colon cancer Neg Hx    Stomach cancer Neg Hx    Esophageal cancer Neg Hx    Rectal cancer Neg Hx    Allergies as of 12/24/2021       Reactions   Colesevelam Other (See Comments)   Epinephrine Other (See Comments)   Pass out    Other    Other reaction(s): Myalgia   Statins Other (See Comments)   Myalgias: Simvastatin also caused back ache   Welchol [colesevelam Hcl]  myalgia   Amoxicillin Itching, Rash   Has patient had a PCN reaction causing immediate rash, facial/tongue/throat swelling, SOB or lightheadedness with hypotension: No Has patient had a PCN reaction causing severe rash involving mucus membranes or skin necrosis: No Has patient had a PCN reaction that required hospitalization: No Has patient had a PCN reaction occurring within the last 10 years: Yes If all of the above answers are "NO", then may proceed with Cephalosporin use.        Medication List        Accurate as of December 24, 2021 11:16 AM. If you have any questions, ask your nurse or doctor.          STOP taking these medications    Repatha SureClick 416 MG/ML Soaj Generic drug: Evolocumab Stopped by: Howard Pouch, DO       TAKE these medications    Accu-Chek Guide test strip Generic drug: glucose blood Use 4 times daily as needed or directed.   Accu-Chek Softclix Lancets lancets Use 4 times daily as needed or directed   apixaban 5 MG Tabs tablet Commonly known as: ELIQUIS Take 1 tablet (5 mg total) by mouth 2 (two) times daily.   baclofen 10 MG tablet Commonly known as: LIORESAL Take 0.5-1 tablets (5-10 mg total) by mouth 2 (two) times daily.   CALTRATE 600 PO Take 600 mg by mouth 2 (two) times daily.   erythromycin ophthalmic ointment Place 1 Application into the right eye at bedtime for 7 days. Started by: Howard Pouch, DO   fenofibrate 160 MG tablet Take 1 tablet (160 mg total) by mouth daily.   isosorbide mononitrate 30 MG 24 hr tablet Commonly known  as: IMDUR Take 0.5 tablets (15 mg total) by mouth daily.   levothyroxine 88 MCG tablet Commonly known as: SYNTHROID Take 88 mcg by mouth every morning.   Magnesium 250 MG Tabs Take 1 tablet by mouth daily.   metFORMIN 500 MG tablet Commonly known as: GLUCOPHAGE Take 500 mg by mouth 2 (two) times daily with a meal.   metoprolol tartrate 25 MG tablet Commonly known as: LOPRESSOR Take 1 tablet (25 mg total) by mouth 2 (two) times daily. Take extra dose for break though Afib with increased HR >100   nitroGLYCERIN 0.4 MG SL tablet Commonly known as: NITROSTAT Place 1 tablet (0.4 mg total) under the tongue every 5 (five) minutes as needed for chest pain.   NovoLIN 70/30 ReliOn (70-30) 100 UNIT/ML injection Generic drug: insulin NPH-regular Human Inject into the skin. 25 for breakfast and 15 lunch and 36 supper   pantoprazole 40 MG tablet Commonly known as: PROTONIX Take 1 tablet (40 mg total) by mouth 2 (two) times daily before a meal.   POTASSIUM CITRATE PO Take 99 mg by mouth daily.   pregabalin 50 MG capsule Commonly known as: LYRICA Take 1 capsule (50 mg total) by mouth 2 (two) times daily.   PROBIOTIC DAILY PO Take 99 mg by mouth daily.   tobramycin 0.3 % ophthalmic solution Commonly known as: TOBREX SMARTSIG:1-2 Drop(s) Right Eye 4 Times Daily   VESALIUS evolocumab (AMG 145) or placebo 140 mg/mL SQ injection Inject 1 mL (140 mg total) into the skin every 14 (fourteen) days. Return kit at next visit.If any questions or concerns occur regarding this medication, please contact [facility]. **For Investigational Drug Use Only**        All past medical history, surgical history, allergies, family history, immunizations andmedications were updated in the EMR  today and reviewed under the history and medication portions of their EMR.     ROS Negative, with the exception of above mentioned in HPI   Objective:  BP 104/62   Pulse (!) 59   Temp 97.6 F (36.4 C)  (Oral)   Ht 5' 5"  (1.651 m)   Wt 180 lb (81.6 kg)   SpO2 97%   BMI 29.95 kg/m  Body mass index is 29.95 kg/m. Physical Exam Vitals and nursing note reviewed.  Constitutional:      General: She is not in acute distress.    Appearance: Normal appearance. She is normal weight. She is not ill-appearing or toxic-appearing.  Eyes:     General:        Right eye: Hordeolum present.        Left eye: No discharge.     Extraocular Movements: Extraocular movements intact.     Conjunctiva/sclera: Conjunctivae normal.     Pupils: Pupils are equal, round, and reactive to light.   Neurological:     Mental Status: She is alert and oriented to person, place, and time. Mental status is at baseline.  Psychiatric:        Mood and Affect: Mood normal.        Behavior: Behavior normal.        Thought Content: Thought content normal.        Judgment: Judgment normal.      No results found. No results found. No results found for this or any previous visit (from the past 24 hour(s)).  Assessment/Plan: MERCIA DOWE is a 66 y.o. female present for OV for  Hordeolum externum of right upper eyelid Warm (disposal compress with daily J&J clean) Erythromycin op ointment QHS X7 days F/u PRN   Reviewed expectations re: course of current medical issues. Discussed self-management of symptoms. Outlined signs and symptoms indicating need for more acute intervention. Patient verbalized understanding and all questions were answered. Patient received an After-Visit Summary.    No orders of the defined types were placed in this encounter.  Meds ordered this encounter  Medications   erythromycin ophthalmic ointment    Sig: Place 1 Application into the right eye at bedtime for 7 days.    Dispense:  3.5 g    Refill:  0   Referral Orders  No referral(s) requested today     Note is dictated utilizing voice recognition software. Although note has been proof read prior to signing, occasional  typographical errors still can be missed. If any questions arise, please do not hesitate to call for verification.   electronically signed by:  Howard Pouch, DO  [place]

## 2021-12-24 NOTE — Patient Instructions (Addendum)
Please call [phone] to schedule for the mammogram bus   Stye A stye, also known as a hordeolum, is a bump that forms on an eyelid. It may look like a pimple next to the eyelash. A stye can form inside the eyelid (internal stye) or outside the eyelid (external stye). A stye can cause redness, swelling, and pain on the eyelid. Styes are very common. Anyone can get them at any age. They usually occur in just one eye at a time, but you may have more than one in either eye. What are the causes? A stye is caused by an infection. The infection is almost always caused by bacteria called Staphylococcus aureus. This is a common type of bacteria that lives on the skin. An internal stye may result from an infected oil-producing gland inside the eyelid. An external stye may be caused by an infection at the base of the eyelash (hair follicle). What increases the risk? You are more likely to develop a stye if: You have had a stye before. You have any of these conditions: Red, itchy, inflamed eyelids (blepharitis). A skin condition such as seborrheic dermatitis or rosacea. High fat levels in your blood (lipids). Dry eyes. What are the signs or symptoms? The most common symptom of a stye is eyelid pain. Internal styes are more painful than external styes. Other symptoms may include: Painful swelling of your eyelid. A scratchy feeling in your eye. Tearing and redness of your eye. A pimple-like bump on the edge of the eyelid. Pus draining from the stye. How is this diagnosed? Your health care provider may be able to diagnose a stye just by examining your eye. The health care provider may also check to make sure: You do not have a fever or other signs of a more serious infection. The infection has not spread to other parts of your eye or areas around your eye. How is this treated? Most styes will clear up in a few days without treatment or with warm compresses applied to the area. You may need to use  antibiotic drops or ointment to treat an infection. Sometimes, steroid drops or ointment are used in addition to antibiotics. In some cases, your health care provider may give you a small steroid injection in the eyelid. If your stye does not heal with routine treatment, your health care provider may drain pus from the stye using a thin blade or needle. This may be done if the stye is large, causing a lot of pain, or affecting your vision. Follow these instructions at home: Take over-the-counter and prescription medicines only as told by your health care provider. This includes eye drops or ointments. If you were prescribed an antibiotic medicine, steroid medicine, or both, apply or use them as told by your health care provider. Do not stop using the medicine even if your condition improves. Apply a warm, wet cloth (warm compress) to your eye for 5-10 minutes, 4 to 6 times a day. Clean the affected eyelid as directed by your health care provider. Do not wear contact lenses or eye makeup until your stye has healed and your health care provider says that it is safe. Do not try to pop or drain the stye. Do not rub your eye. Contact a health care provider if: You have chills or a fever. Your stye does not go away after several days. Your stye affects your vision. Your eyeball becomes swollen, red, or painful. Get help right away if: You have pain when moving  your eye around. Summary A stye is a bump that forms on an eyelid. It may look like a pimple next to the eyelash. A stye can form inside the eyelid (internal stye) or outside the eyelid (external stye). A stye can cause redness, swelling, and pain on the eyelid. Your health care provider may be able to diagnose a stye just by examining your eye. Apply a warm, wet cloth (warm compress) to your eye for 5-10 minutes, 4 to 6 times a day. This information is not intended to replace advice given to you by your health care provider. Make sure you  discuss any questions you have with your health care provider. Document Revised: 07/19/2020 Document Reviewed: 07/19/2020 Elsevier Patient Education  [place].

## 2021-12-26 ENCOUNTER — Other Ambulatory Visit: Payer: Self-pay | Admitting: Cardiology

## 2021-12-26 DIAGNOSIS — I480 Paroxysmal atrial fibrillation: Secondary | ICD-10-CM

## 2021-12-26 NOTE — Telephone Encounter (Signed)
Eliquis '5mg'$  refill request received. Patient is 66 years old, weight-81.6kg, Crea-1.09 on 10/12/2021, Diagnosis-Afib, and last seen by Christen Bame on 12/14/2021. Dose is appropriate based on dosing criteria. Will send in refill to requested pharmacy.

## 2022-01-03 ENCOUNTER — Telehealth: Payer: Self-pay | Admitting: Cardiology

## 2022-01-03 DIAGNOSIS — I480 Paroxysmal atrial fibrillation: Secondary | ICD-10-CM

## 2022-01-03 MED ORDER — APIXABAN 5 MG PO TABS: 5.0000 mg | ORAL_TABLET | Freq: Two times a day (BID) | ORAL | 1 refills | Status: DC

## 2022-01-03 NOTE — Telephone Encounter (Signed)
Pt c/o medication issue:  1. Name of Medication: ELIQUIS 5 MG TABS tablet  2. How are you currently taking this medication (dosage and times per day)? As prescribed   3. Are you having a reaction (difficulty breathing--STAT)?   4. What is your medication issue? Pt states that her pharmacy still has not received this medication and would like it sent again.

## 2022-01-03 NOTE — Telephone Encounter (Signed)
Prescription refill request for Eliquis received. Indication:Afib Last office visit:7/23 Scr:1.0 Age: 66 Weight:81.6 kg  Prescription refilled

## 2022-01-30 ENCOUNTER — Encounter: Payer: Self-pay | Admitting: Family Medicine

## 2022-01-30 MED ORDER — PREGABALIN 50 MG PO CAPS: 50.0000 mg | ORAL_CAPSULE | Freq: Two times a day (BID) | ORAL | 5 refills | Status: DC

## 2022-01-30 NOTE — Telephone Encounter (Signed)
I sent in the prescription for Lyrica for her.  She will need to follow-up in 5.5 months on condition.  Any additional refills will need to be written during appointment since Lyrica is a controlled substance.

## 2022-01-30 NOTE — Telephone Encounter (Signed)
Please advise if refill or appt is appropriate per OV

## 2022-02-12 DIAGNOSIS — Z006 Encounter for examination for normal comparison and control in clinical research program: Secondary | ICD-10-CM

## 2022-02-12 NOTE — Research (Signed)
Patient asked to reschedule her appt for 2 weeks. Stated she just had COVID and has one more injection at home to give herself. Rescheduled for Oct 4.

## 2022-02-14 ENCOUNTER — Telehealth: Payer: Self-pay

## 2022-02-14 DIAGNOSIS — Z1231 Encounter for screening mammogram for malignant neoplasm of breast: Secondary | ICD-10-CM

## 2022-02-14 NOTE — Telephone Encounter (Signed)
Called pt to sched mammogram. Pt asked to place order at breast center

## 2022-02-27 VITALS — BP 126/49 | HR 66 | Temp 96.4°F | Resp 18 | Ht 65.5 in | Wt 180.8 lb

## 2022-02-27 DIAGNOSIS — Z006 Encounter for examination for normal comparison and control in clinical research program: Secondary | ICD-10-CM

## 2022-02-27 MED ORDER — STUDY - VESALIUS - EVOLOCUMAB (AMG 145) 140 MG/ML OR PLACEBO SQ INJECTION (PI-HILTY)
140.0000 mg | INJECTION | SUBCUTANEOUS | Status: DC
  Administered 2022-02-27: 140 mg via SUBCUTANEOUS
  Filled 2022-02-27: qty 1

## 2022-02-27 NOTE — Research (Signed)
Patient was seen in there research clinic today for week 96 of the Vesalius trial. Denies any adverse events or hospitalizations since last visit. Reviewed all concomitant medications and patient stated she was started on Zetia in Sept but unsure of the date. Patient gave herself the IP and was able to correctly demonstrate without any assistance. Next appt made for Jan 10 @ 0830. [facility] Treatment Compliance Date returned:02/27/2022 Box Numbers:2 Number of syringes returned:7 Number of Full administrations:7  Number of partial administrations:0 Number of non-administrations:0 Number on unknown administrations:0  Reason for any partial or non-administrations:0 Number due to non-compliance:0 Number due to device complaint:0 Number to do other:   IP Dispensation: Date dispensed: 02/27/2022 Number dispensed total:8 Number dispensed home to patient:7 Box #:[id number] Box#:[id number]   IP Administration in Clinic: Box #:[id number] Administration QHUT:[id number] Amount administered (none, partial, full):full Reason for none or partial dose: Administration Site: upper quadrant Laterally:right Administered by: patient   Current Outpatient Medications:    ezetimibe (ZETIA) 10 MG tablet, Take 10 mg by mouth daily., Disp: , Rfl:    Accu-Chek Softclix Lancets lancets, Use 4 times daily as needed or directed, Disp: 360 each, Rfl: 3   apixaban (ELIQUIS) 5 MG TABS tablet, Take 1 tablet (5 mg total) by mouth 2 (two) times daily., Disp: 180 tablet, Rfl: 1   baclofen (LIORESAL) 10 MG tablet, Take 0.5-1 tablets (5-10 mg total) by mouth 2 (two) times daily., Disp: 60 each, Rfl: 2   Calcium Carbonate (CALTRATE 600 PO), Take 600 mg by mouth 2 (two) times daily. , Disp: , Rfl:    fenofibrate 160 MG tablet, Take 1 tablet (160 mg total) by mouth daily., Disp: 90 tablet, Rfl: 3   glucose blood (ACCU-CHEK GUIDE) test strip, Use 4 times daily as needed or directed., Disp: 100 each, Rfl: 3   isosorbide  mononitrate (IMDUR) 30 MG 24 hr tablet, Take 0.5 tablets (15 mg total) by mouth daily., Disp: 45 tablet, Rfl: 3   levothyroxine (SYNTHROID) 88 MCG tablet, Take 88 mcg by mouth every morning., Disp: , Rfl:    Magnesium 250 MG TABS, Take 1 tablet by mouth daily., Disp: , Rfl:    metFORMIN (GLUCOPHAGE) 500 MG tablet, Take 500 mg by mouth 2 (two) times daily with a meal., Disp: , Rfl:    metoprolol tartrate (LOPRESSOR) 25 MG tablet, Take 1 tablet (25 mg total) by mouth 2 (two) times daily. Take extra dose for break though Afib with increased HR >100, Disp: 180 tablet, Rfl: 3   nitroGLYCERIN (NITROSTAT) 0.4 MG SL tablet, Place 1 tablet (0.4 mg total) under the tongue every 5 (five) minutes as needed for chest pain., Disp: 25 tablet, Rfl: PRN   NOVOLIN 70/30 RELION (70-30) 100 UNIT/ML injection, Inject into the skin. 25 for breakfast and 15 lunch and 36 supper, Disp: , Rfl:    pantoprazole (PROTONIX) 40 MG tablet, Take 1 tablet (40 mg total) by mouth 2 (two) times daily before a meal., Disp: 180 tablet, Rfl: 3   POTASSIUM CITRATE PO, Take 99 mg by mouth daily. , Disp: , Rfl:    pregabalin (LYRICA) 50 MG capsule, Take 1 capsule (50 mg total) by mouth 2 (two) times daily., Disp: 60 capsule, Rfl: 5   Probiotic Product (PROBIOTIC DAILY PO), Take 99 mg by mouth daily. , Disp: , Rfl:    Study - VESALIUS - evolocumab (AMG 145) 140 mg/mL or placebo SQ injection (PI-Hilty), Inject 1 mL (140 mg total) into the skin every 14 (fourteen)  days. Return kit at next visit.If any questions or concerns occur regarding this medication, please contact [facility]. **For Investigational Drug Use Only**, Disp: 7 mL, Rfl: 0   tobramycin (TOBREX) 0.3 % ophthalmic solution, SMARTSIG:1-2 Drop(s) Right Eye 4 Times Daily, Disp: , Rfl:   Current Facility-Administered Medications:    Study - VESALIUS - evolocumab (AMG 145) 140 mg/mL or placebo SQ injection (PI-Hilty), 140 mg, Subcutaneous, Q14 Days, Hillary Bow, MD

## 2022-03-02 ENCOUNTER — Other Ambulatory Visit: Payer: Self-pay | Admitting: Family Medicine

## 2022-03-05 ENCOUNTER — Other Ambulatory Visit (HOSPITAL_BASED_OUTPATIENT_CLINIC_OR_DEPARTMENT_OTHER): Payer: Self-pay | Admitting: Family Medicine

## 2022-03-05 DIAGNOSIS — Z1231 Encounter for screening mammogram for malignant neoplasm of breast: Secondary | ICD-10-CM

## 2022-03-07 ENCOUNTER — Ambulatory Visit (INDEPENDENT_AMBULATORY_CARE_PROVIDER_SITE_OTHER): Payer: Medicare Other

## 2022-03-07 DIAGNOSIS — Z1231 Encounter for screening mammogram for malignant neoplasm of breast: Secondary | ICD-10-CM

## 2022-03-25 LAB — HM DIABETES EYE EXAM

## 2022-04-09 ENCOUNTER — Telehealth: Payer: Self-pay

## 2022-04-09 NOTE — Telephone Encounter (Signed)
   Name: ACACIA LATORRE  DOB: 1956-01-27  MRN: [id number]  Primary Cardiologist: Minus Breeding, MD  Chart reviewed as part of pre-operative protocol coverage. Because of Amyriah Buras Pizano's past medical history and time since last visit, she will require a follow-up telephone visit in order to better assess preoperative cardiovascular risk.  Pre-op covering staff: - Please schedule appointment and call patient to inform them. If patient already had an upcoming appointment within acceptable timeframe, please add "pre-op clearance" to the appointment notes so provider is aware. - Please contact requesting surgeon's office via preferred method (i.e, phone, fax) to inform them of need for appointment prior to surgery.  Guidance eon anticoagulation listed below.  Elgie Collard, PA-C  04/09/2022, 4:55 PM

## 2022-04-09 NOTE — Telephone Encounter (Signed)
   Pre-operative Risk Assessment    Patient Name: [name] [name]  DOB: [birth year] MRN: [id number]      Request for Surgical Clearance    Procedure:   CERVICAL SNRB  Date of Surgery:  Clearance 04/22/22                                 Surgeon:  Jennell Corner Surgeon's Group or Practice Name:  [facility] Phone number:  [phone] Fax number:  [phone]   Type of Clearance Requested:   - Pharmacy:  Hold Apixaban (Eliquis) 3 DAYS PRIOR TO   SURGERY  Type of Anesthesia:  Not Indicated   Additional requests/questions:  Please fax a copy of CLEARANCE to the surgeon's office.  Signed, Jeanmarie Plant Kyelle Urbas  CCMA 04/09/2022, 1:08 PM

## 2022-04-09 NOTE — Telephone Encounter (Signed)
Patient with diagnosis of atrial fibrillation on Eliquis for anticoagulation.    Procedure:   CERVICAL SNRB   Date of Surgery:  Clearance 04/22/22      CHA2DS2-VASc Score = 5   This indicates a 7.2% annual risk of stroke. The patient's score is based upon: CHF History: 0 HTN History: 1 Diabetes History: 1 Stroke History: 0 Vascular Disease History: 1 Age Score: 1 Gender Score: 1    CrCl 67 Platelet count 233  Per office protocol, patient can hold Eliquis for 3 days prior to procedure.   Patient will not need bridging with Lovenox (enoxaparin) around procedure.  **This guidance is not considered finalized until pre-operative APP has relayed final recommendations.**

## 2022-04-10 ENCOUNTER — Ambulatory Visit: Payer: Medicare Other

## 2022-04-10 ENCOUNTER — Telehealth: Payer: Self-pay | Admitting: *Deleted

## 2022-04-10 NOTE — Telephone Encounter (Signed)
Left message to call back for tele pre op appt 

## 2022-04-10 NOTE — Telephone Encounter (Signed)
Pt called back and has been scheduled for tele pre op appt 04/17/22 @ 9:20. Med rec and consent are done.     Patient Consent for Virtual Visit        [name] [name] has provided verbal consent on 04/10/2022 for a virtual visit (video or telephone).   CONSENT FOR VIRTUAL VISIT FOR:  [name] [name]  By participating in this virtual visit I agree to the following:  I hereby voluntarily request, consent and authorize [facility] and its employed or contracted physicians, physician assistants, nurse practitioners or other licensed health care professionals (the Practitioner), to provide me with telemedicine health care services (the "Services") as deemed necessary by the treating Practitioner. I acknowledge and consent to receive the Services by the Practitioner via telemedicine. I understand that the telemedicine visit will involve communicating with the Practitioner through live audiovisual communication technology and the disclosure of certain medical information by electronic transmission. I acknowledge that I have been given the opportunity to request an in-person assessment or other available alternative prior to the telemedicine visit and am voluntarily participating in the telemedicine visit.  I understand that I have the right to withhold or withdraw my consent to the use of telemedicine in the course of my care at any time, without affecting my right to future care or treatment, and that the Practitioner or I may terminate the telemedicine visit at any time. I understand that I have the right to inspect all information obtained and/or recorded in the course of the telemedicine visit and may receive copies of available information for a reasonable fee.  I understand that some of the potential risks of receiving the Services via telemedicine include:  Delay or interruption in medical evaluation due to technological equipment failure or disruption; Information transmitted may not be  sufficient (e.g. poor resolution of images) to allow for appropriate medical decision making by the Practitioner; and/or  In rare instances, security protocols could fail, causing a breach of personal health information.  Furthermore, I acknowledge that it is my responsibility to provide information about my medical history, conditions and care that is complete and accurate to the best of my ability. I acknowledge that Practitioner's advice, recommendations, and/or decision may be based on factors not within their control, such as incomplete or inaccurate data provided by me or distortions of diagnostic images or specimens that may result from electronic transmissions. I understand that the practice of medicine is not an exact science and that Practitioner makes no warranties or guarantees regarding treatment outcomes. I acknowledge that a copy of this consent can be made available to me via my patient portal ([name] [name]), or I can request a printed copy by calling the office of [name] [name].    I understand that my insurance will be billed for this visit.   I have read or had this consent read to me. I understand the contents of this consent, which adequately explains the benefits and risks of the Services being provided via telemedicine.  I have been provided ample opportunity to ask questions regarding this consent and the Services and have had my questions answered to my satisfaction. I give my informed consent for the services to be provided through the use of telemedicine in my medical care

## 2022-04-10 NOTE — Telephone Encounter (Signed)
Pt called back and has been scheduled for tele pre op appt 04/17/22 @ 9:20. Med rec and consent are done.

## 2022-04-11 ENCOUNTER — Ambulatory Visit (INDEPENDENT_AMBULATORY_CARE_PROVIDER_SITE_OTHER): Payer: Medicare Other

## 2022-04-11 VITALS — BP 116/63 | HR 70 | Temp 97.9°F | Ht 65.0 in | Wt 184.0 lb

## 2022-04-11 DIAGNOSIS — Z0000 Encounter for general adult medical examination without abnormal findings: Secondary | ICD-10-CM

## 2022-04-11 DIAGNOSIS — Z23 Encounter for immunization: Secondary | ICD-10-CM

## 2022-04-11 MED ORDER — ZOSTER VAC RECOMB ADJUVANTED 50 MCG/0.5ML IM SUSR: 0.5000 mL | Freq: Once | INTRAMUSCULAR | 0 refills | Status: AC

## 2022-04-11 MED ORDER — TETANUS-DIPHTH-ACELL PERTUSSIS 5-2.5-18.5 LF-MCG/0.5 IM SUSP: 0.5000 mL | Freq: Once | INTRAMUSCULAR | 0 refills | Status: AC

## 2022-04-11 NOTE — Patient Instructions (Signed)

## 2022-04-11 NOTE — Progress Notes (Signed)
Subjective:   [name] [name] is a 66 y.o. female who presents for Medicare Annual (Subsequent) preventive examination.  Review of Systems    Defer to PCP       Objective:    Today's Vitals   04/11/22 1429 04/11/22 1435  BP: (!) 145/76 116/63  Pulse: 70   Temp: 97.9 F (36.6 C)   SpO2: 97%   Weight:  184 lb (83.5 kg)  Height:  _0  (1.651 m)   Body mass index is 30.62 kg/m.     04/11/2022    2:20 PM 04/04/2021   11:50 AM 08/27/2020    9:00 AM 08/26/2020    2:08 PM 08/27/2018    8:08 AM 06/26/2018   11:38 AM 11/04/2017    2:11 PM  Advanced Directives  Does Patient Have a Medical Advance Directive? _1  Yes Yes  Type of Paramedic of [place];Living will Healthcare Power of [place] of [place];Living will Living will;Healthcare Power of [place];Living will  Does patient want to make changes to medical advance directive? No - Patient declined  No - Patient declined  No - Guardian declined No - Patient declined No - Patient declined  Copy of [place] in Chart? No - copy requested No - copy requested No - copy requested  No - copy requested No - copy requested No - copy requested    Current Medications (verified) Outpatient Encounter Medications as of 04/11/2022  Medication Sig   Tdap (BOOSTRIX) 5-2.5-18.5 LF-MCG/0.5 injection Inject 0.5 mLs into the muscle once for 1 dose.   Zoster Vaccine Adjuvanted [facility]) injection Inject 0.5 mLs into the muscle once for 1 dose.   Accu-Chek Softclix Lancets lancets Use 4 times daily as needed or directed   apixaban (ELIQUIS) 5 MG TABS tablet Take 1 tablet (5 mg total) by mouth 2 (two) times daily.   baclofen (LIORESAL) 10 MG tablet Take 0.5-1 tablets (5-10 mg total) by mouth 2 (two) times daily.   Calcium Carbonate (CALTRATE 600 PO) Take 600 mg by mouth 2 (two) times daily.    ezetimibe (ZETIA) 10 MG  tablet Take 10 mg by mouth daily.   fenofibrate 160 MG tablet Take 1 tablet (160 mg total) by mouth daily.   glucose blood (ACCU-CHEK GUIDE) test strip Use 4 times daily as needed or directed.   isosorbide mononitrate (IMDUR) 30 MG 24 hr tablet Take 0.5 tablets (15 mg total) by mouth daily.   levothyroxine (SYNTHROID) 88 MCG tablet Take 88 mcg by mouth every morning.   Magnesium 250 MG TABS Take 1 tablet by mouth daily.   metFORMIN (GLUCOPHAGE) 500 MG tablet Take 500 mg by mouth 2 (two) times daily with a meal.   metoprolol tartrate (LOPRESSOR) 25 MG tablet Take 1 tablet (25 mg total) by mouth 2 (two) times daily. Take extra dose for break though Afib with increased HR >100   nitroGLYCERIN (NITROSTAT) 0.4 MG SL tablet Place 1 tablet (0.4 mg total) under the tongue every 5 (five) minutes as needed for chest pain. (Patient not taking: Reported on 04/10/2022)   NOVOLIN 70/30 RELION (70-30) 100 UNIT/ML injection Inject into the skin. 25 for breakfast and 15 lunch and 36 supper   pantoprazole (PROTONIX) 40 MG tablet Take 1 tablet (40 mg total) by mouth 2 (two) times daily before a meal.   POTASSIUM CITRATE PO Take 99 mg by mouth daily.  pregabalin (LYRICA) 50 MG capsule Take 1 capsule (50 mg total) by mouth 2 (two) times daily.   Probiotic Product (PROBIOTIC DAILY PO) Take 99 mg by mouth daily.    Study - VESALIUS - evolocumab (AMG 145) 140 mg/mL or placebo SQ injection (PI-Hilty) Inject 1 mL (140 mg total) into the skin every 14 (fourteen) days. Return kit at next visit.If any questions or concerns occur regarding this medication, please contact [facility]. **For Investigational Drug Use Only**   tobramycin (TOBREX) 0.3 % ophthalmic solution SMARTSIG:1-2 Drop(s) Right Eye 4 Times Daily (Patient not taking: Reported on 04/10/2022)   Facility-Administered Encounter Medications as of 04/11/2022  Medication   Study - VESALIUS - evolocumab (AMG 145) 140 mg/mL or placebo SQ injection (PI-Hilty)     Allergies (verified) Colesevelam, Epinephrine, Other, Statins, Welchol [colesevelam hcl], and Amoxicillin   History: Past Medical History:  Diagnosis Date   Accelerating angina ([place]) 11/04/2017   Allergy    Asthma    Atrial fibrillation (HCC)    Cataract    Chronic pain disorder    neck and shoulder   Chronic pain of right upper extremity 11/10/2017   CTS (carpal tunnel syndrome) 01/27/2013   right   Diabetes mellitus type II    Diabetic gastroparesis (HCC)    Esophageal reflux 08/25/2013   [place], Dr Barth Kirks   FATTY LIVER DISEASE 04/27/2007   Qualifier: Diagnosis of  By: Danelle Earthly CMA, Darlene     Focal muscle atrophy 09/26/2015   Fundic gland polyps of stomach, benign    Globus sensation 04/13/2018   HTN (hypertension)    Hx of adenomatous colonic polyps 12/10/2021   Hyperlipidemia, mixed 05/04/2007   Qualifier: Diagnosis of  By: Wynona Luna crestor caused myalgias even at low dose Livalo caused myalgias Lipitor  Mother with severe reaction myalgias Simvastatin, Welchol    Hypokalemia 05/25/2013   Improved stopping HCTZ. Was noted to have an elevated glucose when K was low. Recheck renal next week after starting Maxzide   Hypomagnesemia 08/10/2014   Hypothyroidism    Iron deficiency anemia 08/07/2018   Iron malabsorption 08/07/2018   Laryngitis 08/25/2017   Leg cramps 04/15/2016   Nausea without vomiting 08/08/2014   NONSPEC ELEVATION OF LEVELS OF TRANSAMINASE/LDH 05/01/2007   Qualifier: Diagnosis of  By: Garner Gavel     Osteoarthritis    chronic, right knee (11/04/2017)   Plantar fasciitis    Rosacea 10/22/2016   Urine incontinence    Past Surgical History:  Procedure Laterality Date   AUGMENTATION MAMMAPLASTY Bilateral 2002   BLADDER SUSPENSION  1981   COLONOSCOPY     CORONARY STENT INTERVENTION N/A 11/04/2017   Procedure: CORONARY STENT INTERVENTION;  Surgeon: Nelva Bush, MD;  Location: [facility];  Service: Cardiovascular;   Laterality: N/A;   ENDOVENOUS ABLATION SAPHENOUS VEIN W/ LASER Left 12/29/2019   endovenous laser ablation left greater saphenous vein by Ruta Hinds MD    ESOPHAGOGASTRODUODENOSCOPY     INTRAVASCULAR PRESSURE WIRE/FFR STUDY N/A 11/04/2017   Procedure: INTRAVASCULAR PRESSURE WIRE/FFR STUDY;  Surgeon: Nelva Bush, MD;  Location: [facility];  Service: Cardiovascular;  Laterality: N/A;   INTRAVASCULAR ULTRASOUND/IVUS N/A 11/04/2017   Procedure: Intravascular Ultrasound/IVUS;  Surgeon: Nelva Bush, MD;  Location: [facility];  Service: Cardiovascular;  Laterality: N/A;   KNEE ARTHROSCOPY  1998, [id number]   LAPAROSCOPIC CHOLECYSTECTOMY  2007   LEFT HEART CATH AND CORONARY ANGIOGRAPHY N/A 11/04/2017   Procedure: LEFT HEART CATH AND  CORONARY ANGIOGRAPHY;  Surgeon: Nelva Bush, MD;  Location: [facility];  Service: Cardiovascular;  Laterality: N/A;   LEFT HEART CATHETERIZATION WITH CORONARY ANGIOGRAM N/A 11/21/2011   Procedure: LEFT HEART CATHETERIZATION WITH CORONARY ANGIOGRAM;  Surgeon: Josue Hector, MD;  Location: [facility];  Service: Cardiovascular;  Laterality: N/A;   [place]   "still have my ovaries"   Family History  Problem Relation Age of Onset   Alzheimer's disease Mother    Diabetes type II Mother    Hiatal hernia Mother    Diabetes Mother    Emphysema Father    COPD Father    Depression Sister        suicide   Diabetes Sister    Diabetes Daughter    Colon cancer Neg Hx    Stomach cancer Neg Hx    Esophageal cancer Neg Hx    Rectal cancer Neg Hx    Social History   Socioeconomic History   Marital status: Married    Spouse name: Not on file   Number of children: 1   Years of education: Not on file   Highest education level: Not on file  Occupational History   Occupation: [employer]  Tobacco Use   Smoking status: Never   Smokeless tobacco: Never  Vaping Use   Vaping  Use: Never used  Substance and Sexual Activity   Alcohol use: Not Currently   Drug use: Never   Sexual activity: Not Currently    Partners: Male  Other Topics Concern   Not on file  Social History Narrative   Marital status/children/pets: Married, 1 child.    Education/employment: HS   Safety:      -smoke alarm in the home:Yes     - wears seatbelt: Yes     - Feels safe in their relationships: Yes   Social Determinants of Health   Financial Resource Strain: Low Risk  (04/11/2022)   Overall Financial Resource Strain (CARDIA)    Difficulty of Paying Living Expenses: Not hard at all  Food Insecurity: No Food Insecurity (04/11/2022)   Hunger Vital Sign    Worried About Running Out of Food in the Last Year: Never true    Ran Out of Food in the Last Year: Never true  Transportation Needs: No Transportation Needs (04/11/2022)   PRAPARE - Hydrologist (Medical): No    Lack of Transportation (Non-Medical): No  Physical Activity: Inactive (04/04/2021)   Exercise Vital Sign    Days of Exercise per Week: 0 days    Minutes of Exercise per Session: 0 min  Stress: No Stress Concern Present (04/11/2022)   [place]    Feeling of Stress : Not at all  Social Connections: Moderately Isolated (04/11/2022)   Social Connection and Isolation Panel [NHANES]    Frequency of Communication with Friends and Family: More than three times a week    Frequency of Social Gatherings with Friends and Family: More than three times a week    Attends Religious Services: Never    Marine scientist or Organizations: No    Attends Music therapist: Never    Marital Status: Married    Tobacco Counseling Counseling given: Not Answered   Clinical Intake:  Pre-visit preparation completed: No        Nutritional Risks: None Diabetes: No  How often do you  need to have someone help you when you  read instructions, pamphlets, or other written materials from your doctor or pharmacy?: 1 - Never What is the last grade level you completed in school?: 12  Diabetic?no         Activities of Daily Living    04/11/2022    2:23 PM  In your present state of health, do you have any difficulty performing the following activities:  Hearing? 1  Comment has hearing aid  Vision? 1  Difficulty concentrating or making decisions? 0  Walking or climbing stairs? 0  Dressing or bathing? 0  Doing errands, shopping? 0  Preparing Food and eating ? N  Using the Toilet? N  In the past six months, have you accidently leaked urine? Y  Do you have problems with loss of bowel control? N  Managing your Medications? N  Managing your Finances? N  Housekeeping or managing your Housekeeping? N    Patient Care Team: Ma Hillock, DO as PCP - General (Family Medicine) Minus Breeding, MD as PCP - Cardiology (Cardiology) Gatha Mayer, MD as Consulting Physician (Gastroenterology) Marshell Garfinkel, MD as Consulting Physician (Pulmonary Disease) Jacelyn Pi, MD as Referring Physician (Endocrinology) Susa Day, MD as Consulting Physician (Orthopedic Surgery) Chanda Busing, RN (Inactive) as Registered Nurse  Indicate any recent Medical Services you may have received from other than [facility] providers in the past year (date may be approximate).     Assessment:   This is a routine wellness examination for [name] [name].  Hearing/Vision screen No results found.  Dietary issues and exercise activities discussed: Exercise limited by: None identified   Goals Addressed             This Visit's Progress    Quality of Life Maintained       Evidence-based guidance:  Assess patient's thoughts about quality of life, goals and expectations, and dissatisfaction or desire to improve.  Identify issues of primary importance such as mental health, illness, exercise tolerance, pain, sexual function and  intimacy, cognitive change, social isolation, finances and relationships.  Assess and monitor for signs/symptoms of psychosocial concerns, especially depression or ideations regarding harm to others or self; provide or refer for mental health services as needed.  Identify sensory issues that impact quality of life such as hearing loss, vision deficit; strategize ways to maintain or improve hearing, vision.  Promote access to services in the community to support independence such as support groups, home visiting programs, financial assistance, handicapped parking tags, durable medical equipment and emergency responder.  Promote activities to decrease social isolation such as group support or social, leisure and recreational activities, employment, use of social media; consider safety concerns about being out of home for activities.  Provide patient an opportunity to share by storytelling or a "life review" to give positive meaning to life and to assist with coping and negative experiences.  Encourage patient to tap into hope to improve sense of self.  Counsel based on prognosis and as early as possible about end-of-life and palliative care; consider referral to palliative care provider.  Advocate for the development of palliative care plan that may include avoidance of unnecessary testing and intervention, symptom control, discontinuation of medications, hospice and organ donation.  Counsel as early as possible those with life-limiting chronic disease about palliative care; consider referral to palliative care provider.  Advocate for the development of palliative care plan.   Notes:       Depression Screen    04/11/2022  2:16 PM 04/04/2021   11:48 AM 09/06/2020   11:25 AM 06/03/2018    4:19 PM 09/06/2014    8:33 AM  PHQ 2/9 Scores  PHQ - 2 Score 0 0 0 3 0  PHQ- 9 Score    8     Fall Risk    04/11/2022    2:23 PM 04/04/2021   11:51 AM 09/06/2020   11:26 AM 09/06/2014    8:33 AM  Fall Risk    Falls in the past year? 0 0 0 No  Number falls in past yr: 0 0 0   Injury with Fall? 0 0 0   Risk for fall due to : No Fall Risks Impaired vision    Follow up  Falls prevention discussed      FALL RISK PREVENTION PERTAINING TO THE HOME:  Any stairs in or around the home? Yes  If so, are there any without handrails? No  Home free of loose throw rugs in walkways, pet beds, electrical cords, etc? Yes  Adequate lighting in your home to reduce risk of falls? Yes   ASSISTIVE DEVICES UTILIZED TO PREVENT FALLS:  Life alert? No  Use of a cane, walker or w/c? No  Grab bars in the bathroom? No  Shower chair or bench in shower? No  Elevated toilet seat or a handicapped toilet? Yes   TIMED UP AND GO:  Was the test performed? Yes .  Length of time to ambulate 10 feet: 5 sec.   Gait steady and fast without use of assistive device  Cognitive Function:        04/11/2022    2:27 PM 04/04/2021   11:53 AM  6CIT Screen  What Year? 0 points 0 points  What month? 0 points 0 points  What time? 0 points 0 points  Count back from 20 0 points 0 points  Months in reverse 0 points 0 points  Repeat phrase 0 points 0 points  Total Score 0 points 0 points    Immunizations Immunization History  Administered Date(s) Administered   Fluad Quad(high Dose 65+) 02/25/2020, 03/13/2021, 04/11/2022   Influenza Split 03/27/2011, 04/08/2012   Influenza Whole 03/14/2009, 02/06/2010   Influenza, Quadrivalent, Recombinant, Inj, Pf 03/13/2021   Influenza,inj,Quad PF,6+ Mos 01/27/2013, 05/02/2014, 04/12/2017, 03/31/2018, 03/15/2019   PFIZER(Purple Top)SARS-COV-2 Vaccination 08/08/2019, 09/01/2019, 03/13/2021   PNEUMOCOCCAL CONJUGATE-20 04/11/2022   Pfizer Covid-19 Vaccine Bivalent Booster 27yr & up 03/13/2021   Pneumococcal Polysaccharide-23 03/28/2008   Tdap 09/18/2011   Zoster, Live 04/15/2016    TDAP status: Due, Education has been provided regarding the importance of this vaccine. Advised may  receive this vaccine at local pharmacy or Health Dept. Aware to provide a copy of the vaccination record if obtained from local pharmacy or Health Dept. Verbalized acceptance and understanding.  Flu Vaccine status: Completed at today's visit  Pneumococcal vaccine status: Completed during today's visit.  Covid-19 vaccine status: Completed vaccines  Qualifies for Shingles Vaccine? Yes   Zostavax completed No   Shingrix Completed?: No.    Education has been provided regarding the importance of this vaccine. Patient has been advised to call insurance company to determine out of pocket expense if they have not yet received this vaccine. Advised may also receive vaccine at local pharmacy or Health Dept. Verbalized acceptance and understanding.  Screening Tests Health Maintenance  Topic Date Due   Zoster Vaccines- Shingrix (1 of 2) Never done   DEXA SCAN  Never done   FOOT EXAM  05/02/2021  COVID-19 Vaccine (4 - Pfizer risk series) 05/08/2021   TETANUS/TDAP  09/17/2021   HEMOGLOBIN A1C  01/16/2022   OPHTHALMOLOGY EXAM  04/23/2022   Diabetic kidney evaluation - Urine ACR  07/19/2022   Diabetic kidney evaluation - GFR measurement  10/13/2022   Medicare Annual Wellness (AWV)  04/12/2023   MAMMOGRAM  03/07/2024   COLONOSCOPY (Pts 45-52yr Insurance coverage will need to be confirmed)  12/04/2028   Pneumonia Vaccine 66 Years old  Completed   INFLUENZA VACCINE  Completed   Hepatitis C Screening  Completed   HPV VACCINES  Aged Out    Health Maintenance  Health Maintenance Due  Topic Date Due   Zoster Vaccines- Shingrix (1 of 2) Never done   DEXA SCAN  Never done   FOOT EXAM  05/02/2021   COVID-19 Vaccine (4 - Pfizer risk series) 05/08/2021   TETANUS/TDAP  09/17/2021   HEMOGLOBIN A1C  01/16/2022    Colorectal cancer screening: Type of screening: Colonoscopy. Completed 12/04/2021. Repeat every 7 years  Mammogram status: Completed 03/07/22. Repeat every year  Bone Density status:  Ordered n/a. Pt provided with contact info and advised to call to schedule appt. Will discuss with provider with next CPE  Lung Cancer Screening: (Low Dose CT Chest recommended if Age 67-80years, 30 pack-year currently smoking OR have quit w/in 15years.) does not qualify.   Lung Cancer Screening Referral: n/a  Additional Screening:  Hepatitis C Screening: does qualify; Completed 11/25/2014  Vision Screening: Recommended annual ophthalmology exams for early detection of glaucoma and other disorders of the eye. Is the patient up to date with their annual eye exam?  Yes  Who is the provider or what is the name of the office in which the patient attends annual eye exams? [facility] If pt is not established with a provider, would they like to be referred to a provider to establish care? No .   Dental Screening: Recommended annual dental exams for proper oral hygiene  Community Resource Referral / Chronic Care Management: CRR required this visit?  No   CCM required this visit?  No      Plan:     I have personally reviewed and noted the following in the patient's chart:   Medical and social history Use of alcohol, tobacco or illicit drugs  Current medications and supplements including opioid prescriptions. Patient is not currently taking opioid prescriptions. Functional ability and status Nutritional status Physical activity Advanced directives List of other physicians Hospitalizations, surgeries, and ER visits in previous 12 months Vitals Screenings to include cognitive, depression, and falls Referrals and appointments  In addition, I have reviewed and discussed with patient certain preventive protocols, quality metrics, and best practice recommendations. A written personalized care plan for preventive services as well as general preventive health recommendations were provided to patient.     VBeatrix Fetters CSan Antonio  04/11/2022   Nurse Notes: Non-Face to Face or Face to  Face 20 minute visit Encounter    Ms. JWynetta [name], Thank you for taking time to come for your Medicare Wellness Visit. I appreciate your ongoing commitment to your health goals. Please review the following plan we discussed and let me know if I can assist you in the future.   These are the goals we discussed:  Goals      Patient Stated     Lose weight      Quality of Life Maintained     Evidence-based guidance:  Assess patient's thoughts about  quality of life, goals and expectations, and dissatisfaction or desire to improve.  Identify issues of primary importance such as mental health, illness, exercise tolerance, pain, sexual function and intimacy, cognitive change, social isolation, finances and relationships.  Assess and monitor for signs/symptoms of psychosocial concerns, especially depression or ideations regarding harm to others or self; provide or refer for mental health services as needed.  Identify sensory issues that impact quality of life such as hearing loss, vision deficit; strategize ways to maintain or improve hearing, vision.  Promote access to services in the community to support independence such as support groups, home visiting programs, financial assistance, handicapped parking tags, durable medical equipment and emergency responder.  Promote activities to decrease social isolation such as group support or social, leisure and recreational activities, employment, use of social media; consider safety concerns about being out of home for activities.  Provide patient an opportunity to share by storytelling or a "life review" to give positive meaning to life and to assist with coping and negative experiences.  Encourage patient to tap into hope to improve sense of self.  Counsel based on prognosis and as early as possible about end-of-life and palliative care; consider referral to palliative care provider.  Advocate for the development of palliative care plan that may include  avoidance of unnecessary testing and intervention, symptom control, discontinuation of medications, hospice and organ donation.  Counsel as early as possible those with life-limiting chronic disease about palliative care; consider referral to palliative care provider.  Advocate for the development of palliative care plan.   Notes:         This is a list of the screening recommended for you and due dates:  Health Maintenance  Topic Date Due   Zoster (Shingles) Vaccine (1 of 2) Never done   DEXA scan (bone density measurement)  Never done   Complete foot exam   05/02/2021   COVID-19 Vaccine (4 - Pfizer risk series) 05/08/2021   Tetanus Vaccine  09/17/2021   Hemoglobin A1C  01/16/2022   Eye exam for diabetics  04/23/2022   Yearly kidney health urinalysis for diabetes  07/19/2022   Yearly kidney function blood test for diabetes  10/13/2022   Medicare Annual Wellness Visit  04/12/2023   Mammogram  03/07/2024   Colon Cancer Screening  12/04/2028   Pneumonia Vaccine  Completed   Flu Shot  Completed   Hepatitis C Screening: USPSTF Recommendation to screen - Ages 18-79 yo.  Completed   HPV Vaccine  Aged Out

## 2022-04-14 ENCOUNTER — Other Ambulatory Visit: Payer: Self-pay | Admitting: Family Medicine

## 2022-04-16 NOTE — Progress Notes (Unsigned)
Virtual Visit via Telephone Note   Because of [name] [name]'s co-morbid illnesses, she is at least at moderate risk for complications without adequate follow up.  This format is felt to be most appropriate for this patient at this time.  The patient did not have access to video technology/had technical difficulties with video requiring transitioning to audio format only (telephone).  All issues noted in this document were discussed and addressed.  No physical exam could be performed with this format.  Please refer to the patient's chart for her consent to telehealth for [facility].  Evaluation Performed:  Preoperative Cardiovascular Risk Assessment _____________   Date:  04/17/2022   Patient ID:  [name] [name], Large 03-25-1956, MRN [id number] Patient Location:  Home Provider location:   Office  Primary Care Provider:  Ma Hillock, DO Primary Cardiologist:  Minus Breeding, MD  Chief Complaint / Patient Profile   [name] [name] is a 66 y.o. year old female with a history of CAD s/p DES to ostial/ proximal LAD in 10/2017 with known occlusion of D1 branch, paroxysmal atrial fibrillation on Eliquis, hypertension, hyperlipidemia, type 2 diabetes, hypothyroidism, anemia, and chronic pain who is pending a cervical selective nerve root block and presents today for telephonic preoperative cardiovascular risk assessment.  Past Medical History    Past Medical History:  Diagnosis Date   Accelerating angina ([place]) 11/04/2017   Allergy    Asthma    Atrial fibrillation (HCC)    Cataract    Chronic pain disorder    neck and shoulder   Chronic pain of right upper extremity 11/10/2017   CTS (carpal tunnel syndrome) 01/27/2013   right   Diabetes mellitus type II    Diabetic gastroparesis (HCC)    Esophageal reflux 08/25/2013   [place], Dr Barth Kirks   FATTY LIVER DISEASE 04/27/2007   Qualifier: Diagnosis of  By: Danelle Earthly CMA, Darlene     Focal muscle atrophy  09/26/2015   Fundic gland polyps of stomach, benign    Globus sensation 04/13/2018   HTN (hypertension)    Hx of adenomatous colonic polyps 12/10/2021   Hyperlipidemia, mixed 05/04/2007   Qualifier: Diagnosis of  By: Wynona Luna crestor caused myalgias even at low dose Livalo caused myalgias Lipitor  Mother with severe reaction myalgias Simvastatin, Welchol    Hypokalemia 05/25/2013   Improved stopping HCTZ. Was noted to have an elevated glucose when K was low. Recheck renal next week after starting Maxzide   Hypomagnesemia 08/10/2014   Hypothyroidism    Iron deficiency anemia 08/07/2018   Iron malabsorption 08/07/2018   Laryngitis 08/25/2017   Leg cramps 04/15/2016   Nausea without vomiting 08/08/2014   NONSPEC ELEVATION OF LEVELS OF TRANSAMINASE/LDH 05/01/2007   Qualifier: Diagnosis of  By: Garner Gavel     Osteoarthritis    chronic, right knee (11/04/2017)   Plantar fasciitis    Rosacea 10/22/2016   Urine incontinence    Past Surgical History:  Procedure Laterality Date   AUGMENTATION MAMMAPLASTY Bilateral 2002   BLADDER SUSPENSION  1981   COLONOSCOPY     CORONARY STENT INTERVENTION N/A 11/04/2017   Procedure: CORONARY STENT INTERVENTION;  Surgeon: Nelva Bush, MD;  Location: [facility];  Service: Cardiovascular;  Laterality: N/A;   ENDOVENOUS ABLATION SAPHENOUS VEIN W/ LASER Left 12/29/2019   endovenous laser ablation left greater saphenous vein by Ruta Hinds MD    ESOPHAGOGASTRODUODENOSCOPY     INTRAVASCULAR PRESSURE WIRE/FFR STUDY N/A 11/04/2017  Procedure: INTRAVASCULAR PRESSURE WIRE/FFR STUDY;  Surgeon: Nelva Bush, MD;  Location: [facility];  Service: Cardiovascular;  Laterality: N/A;   INTRAVASCULAR ULTRASOUND/IVUS N/A 11/04/2017   Procedure: Intravascular Ultrasound/IVUS;  Surgeon: Nelva Bush, MD;  Location: [facility];  Service: Cardiovascular;  Laterality: N/A;   KNEE ARTHROSCOPY  1998, [id number]   LAPAROSCOPIC  CHOLECYSTECTOMY  2007   LEFT HEART CATH AND CORONARY ANGIOGRAPHY N/A 11/04/2017   Procedure: LEFT HEART CATH AND CORONARY ANGIOGRAPHY;  Surgeon: Nelva Bush, MD;  Location: [facility];  Service: Cardiovascular;  Laterality: N/A;   LEFT HEART CATHETERIZATION WITH CORONARY ANGIOGRAM N/A 11/21/2011   Procedure: LEFT HEART CATHETERIZATION WITH CORONARY ANGIOGRAM;  Surgeon: Josue Hector, MD;  Location: [facility];  Service: Cardiovascular;  Laterality: N/A;   [place]   "still have my ovaries"    Allergies  Allergies  Allergen Reactions   Colesevelam Other (See Comments)   Epinephrine Other (See Comments)    Pass out    Other     Other reaction(s): Myalgia   Statins Other (See Comments)    Myalgias: Simvastatin also caused back ache   Welchol [Colesevelam Hcl]     myalgia   Amoxicillin Itching and Rash    Has patient had a PCN reaction causing immediate rash, facial/tongue/throat swelling, SOB or lightheadedness with hypotension: No Has patient had a PCN reaction causing severe rash involving mucus membranes or skin necrosis: No Has patient had a PCN reaction that required hospitalization: No Has patient had a PCN reaction occurring within the last 10 years: Yes If all of the above answers are "NO", then may proceed with Cephalosporin use.     History of Present Illness    [name] [name] is a 66 y.o. female who presents via audio/video conferencing for a telehealth visit today.  Patient was last seen in our office on 07/31/2021 by Dr. Percival Spanish.  At that time, she was doing well from a cardiac standpoint. She is now pending procedure as outlined above. Since her last visit, she has done well from a cardiac standpoint. She denies any chest pain, shortness of breath, orthopnea/PND, or significant palpitations. She has some lightheadedness/ dizziness when she looks up due to the "pinched nerve" in her neck but no syncope. This is why she  is having the nerve block done. She is able to complete > 4.0 METs and is still working out in the yard and doing all house work.  Home Medications    Prior to Admission medications   Medication Sig Start Date End Date Taking? Authorizing Provider  Accu-Chek Softclix Lancets lancets Use 4 times daily as needed or directed 03/09/20   Renato Shin, MD  apixaban (ELIQUIS) 5 MG TABS tablet Take 1 tablet (5 mg total) by mouth 2 (two) times daily. 01/03/22   Minus Breeding, MD  baclofen (LIORESAL) 10 MG tablet Take 0.5-1 tablets (5-10 mg total) by mouth 2 (two) times daily. 10/26/21   Kuneff, [name], DO  Calcium Carbonate (CALTRATE 600 PO) Take 600 mg by mouth 2 (two) times daily.     [provider]  ezetimibe (ZETIA) 10 MG tablet Take 10 mg by mouth daily.    [provider]  fenofibrate 160 MG tablet Take 1 tablet (160 mg total) by mouth daily. 06/26/21   Minus Breeding, MD  glucose blood (ACCU-CHEK GUIDE) test strip Use 4 times daily as needed or directed. 03/09/20  Renato Shin, MD  isosorbide mononitrate (IMDUR) 30 MG 24 hr tablet Take 0.5 tablets (15 mg total) by mouth daily. 09/25/21   Minus Breeding, MD  levothyroxine (SYNTHROID) 88 MCG tablet Take 88 mcg by mouth every morning. 09/26/21   [provider]  Magnesium 250 MG TABS Take 1 tablet by mouth daily.    [provider]  metFORMIN (GLUCOPHAGE) 500 MG tablet Take 500 mg by mouth 2 (two) times daily with a meal.    [provider]  metoprolol tartrate (LOPRESSOR) 25 MG tablet Take 1 tablet (25 mg total) by mouth 2 (two) times daily. Take extra dose for break though Afib with increased HR >100 06/26/21   Minus Breeding, MD  nitroGLYCERIN (NITROSTAT) 0.4 MG SL tablet Place 1 tablet (0.4 mg total) under the tongue every 5 (five) minutes as needed for chest pain. Patient not taking: Reported on 04/10/2022 08/02/21   Minus Breeding, MD  NOVOLIN 70/30 RELION (70-30) 100 UNIT/ML injection Inject into the  skin. 25 for breakfast and 15 lunch and 36 supper 12/16/20   [provider]  pantoprazole (PROTONIX) 40 MG tablet Take 1 tablet (40 mg total) by mouth 2 (two) times daily before a meal. 09/28/21   Gatha Mayer, MD  POTASSIUM CITRATE PO Take 99 mg by mouth daily.     [provider]  pregabalin (LYRICA) 50 MG capsule Take 1 capsule (50 mg total) by mouth 2 (two) times daily. 01/30/22   Kuneff, [name], DO  Probiotic Product (PROBIOTIC DAILY PO) Take 99 mg by mouth daily.     [provider]  Study - VESALIUS - evolocumab (AMG 145) 140 mg/mL or placebo SQ injection (PI-Hilty) Inject 1 mL (140 mg total) into the skin every 14 (fourteen) days. Return kit at next visit.If any questions or concerns occur regarding this medication, please contact [facility]. **For Investigational Drug Use Only** 10/24/21   Hilty, Nadean Corwin, MD  tobramycin (TOBREX) 0.3 % ophthalmic solution SMARTSIG:1-2 Drop(s) Right Eye 4 Times Daily Patient not taking: Reported on 04/10/2022 12/16/21   [provider]    Physical Exam    Vital Signs:  GISELLA ALWINE does not have vital signs available for review today.  Given telephonic nature of communication, physical exam is limited. Alert and oriented x3. No acute distress. Normal affect. Speech and respirations are unlabored.  Accessory Clinical Findings    None  Assessment & Plan    Preoperative Cardiovascular Risk Assessment: Patient has an upcoming cervical selective nerve root block planned. She is stable from a cardiac standpoint with no angina, shortness of breath, acute CHF symptoms, significant palpitations, or syncope. She is able to complete >4.0 METS without any angina. Per Revised Cardiac Risk Index, considered Class II risk with a 6.0% 30-day risk of death, MI, or cardiac arrest. Therefore, based on ACC/AHA guidelines, patient would be at acceptable risk for the planned procedure without further cardiovascular testing. Per  Pharmacy and office protocol, patient can hold Eliquis for 3 days prior to procedure. Please restart this as soon as safely possible afterwards. I will route this recommendation to the requesting party via Epic fax function.   A copy of this note will be routed to requesting surgeon.  Time:   Today, I have spent 4 minutes and 54 seconds with the patient with telehealth technology discussing medical history, symptoms, and management plan.     Darreld Mclean, PA-C  04/17/2022, 9:22 AM

## 2022-04-17 ENCOUNTER — Ambulatory Visit: Payer: Medicare Other | Attending: Cardiology | Admitting: Student

## 2022-04-17 DIAGNOSIS — Z01810 Encounter for preprocedural cardiovascular examination: Secondary | ICD-10-CM

## 2022-06-01 ENCOUNTER — Other Ambulatory Visit: Payer: Self-pay | Admitting: Cardiology

## 2022-06-10 ENCOUNTER — Encounter: Payer: Medicare Other | Admitting: *Deleted

## 2022-06-10 DIAGNOSIS — Z006 Encounter for examination for normal comparison and control in clinical research program: Secondary | ICD-10-CM

## 2022-06-10 MED ORDER — STUDY - VESALIUS - EVOLOCUMAB (AMG 145) 140 MG/ML OR PLACEBO SQ INJECTION (PI-HILTY)
140.0000 mg | INJECTION | SUBCUTANEOUS | Status: DC
  Administered 2022-06-10: 140 mg via SUBCUTANEOUS
  Filled 2022-06-10: qty 1

## 2022-06-10 MED ORDER — STUDY - VESALIUS - EVOLOCUMAB (AMG 145) 140 MG/ML OR PLACEBO SQ INJECTION (PI-HILTY): 140.0000 mg | INJECTION | SUBCUTANEOUS | 0 refills | Status: DC

## 2022-06-10 NOTE — Research (Addendum)
Patient was seen in the research clinic today for week 112 of the Vesalius trial. Denies any adverse events or hospitalizations since last visit. Reviewed all concomitant medications. Patient gave herself the IP and was able to correctly demonstrate without any assistance. Next appt made for May 7th at 9:00am          Non-Fatal Potential Endpoint Assessment Yes  No   Has the subject experienced/undergone any of the following since the last visit/contact?   []   [x]    Any Coronary Artery Revascularization/Cerebrovascular Revascularization/ Peripheral Artery Revascularization/Amputation Procedure   []   [x]    Myocardial Infarction []   [x]    Stroke   []   x  Provide the date for the non-fatal Potential Endpoints status:   []   [x]      [facility] Treatment Compliance Date returned:06/10/2022 Box Numbers:2 Number of syringes returned:7 Number of Full administrations:7   Number of partial administrations:0 Number of non-administrations:0 Number on unknown administrations:0   Reason for any partial or non-administrations:0 Number due to non-compliance:0 Number due to device complaint:0 Number to do other:     IP Dispensation: Date dispensed: 06/10/2022 Number dispensed total:8 Number dispensed home to patient:7 Box #:[id number] MWU#:[id number]    IP Administration in Clinic: Box #:[id number] Administration time:10:01 Amount administered (none, partial, full):full Reason for none or partial dose: Administration Site: upper quadrant Laterally:right Administered by: patient   Current Outpatient Medications:    apixaban (ELIQUIS) 5 MG TABS tablet, Take 1 tablet (5 mg total) by mouth 2 (two) times daily., Disp: 180 tablet, Rfl: 1   baclofen (LIORESAL) 10 MG tablet, Take 0.5-1 tablets (5-10 mg total) by mouth 2 (two) times daily., Disp: 60 each, Rfl: 2   Calcium Carbonate (CALTRATE 600 PO), Take 600 mg by mouth 2 (two) times daily. , Disp: , Rfl:    Cyanocobalamin (VITAMIN B 12 PO),  Take by mouth daily., Disp: , Rfl:    ezetimibe (ZETIA) 10 MG tablet, Take 10 mg by mouth daily., Disp: , Rfl:    fenofibrate 160 MG tablet, Take 1 tablet (160 mg total) by mouth daily., Disp: 90 tablet, Rfl: 3   isosorbide mononitrate (IMDUR) 30 MG 24 hr tablet, Take 0.5 tablets (15 mg total) by mouth daily., Disp: 45 tablet, Rfl: 3   levothyroxine (SYNTHROID) 88 MCG tablet, Take 88 mcg by mouth every morning., Disp: , Rfl:    Magnesium 250 MG TABS, Take 1 tablet by mouth daily., Disp: , Rfl:    metFORMIN (GLUCOPHAGE) 500 MG tablet, Take 500 mg by mouth 2 (two) times daily with a meal., Disp: , Rfl:    metoprolol tartrate (LOPRESSOR) 25 MG tablet, TAKE 1 TABLET TWICE A DAY. TAKE AN EXTRA DOSE FOR BREAK THROUGH ATRIAL FIBRILLATION WITH INCREASED HEART RATE GREATER THAN 100., Disp: 180 tablet, Rfl: 3   nitroGLYCERIN (NITROSTAT) 0.4 MG SL tablet, Place 1 tablet (0.4 mg total) under the tongue every 5 (five) minutes as needed for chest pain., Disp: 25 tablet, Rfl: PRN   NOVOLIN 70/30 RELION (70-30) 100 UNIT/ML injection, Inject into the skin. 25 for breakfast and 15 lunch and 36 supper, Disp: , Rfl:    pantoprazole (PROTONIX) 40 MG tablet, Take 1 tablet (40 mg total) by mouth 2 (two) times daily before a meal., Disp: 180 tablet, Rfl: 3   pregabalin (LYRICA) 50 MG capsule, Take 1 capsule (50 mg total) by mouth 2 (two) times daily., Disp: 60 capsule, Rfl: 5   Probiotic Product (PROBIOTIC DAILY PO), Take 99 mg by mouth daily. ,  Disp: , Rfl:    Study - VESALIUS - evolocumab (AMG 145) 140 mg/mL or placebo SQ injection (PI-Hilty), Inject 1 mL (140 mg total) into the skin every 14 (fourteen) days. Return kit at next visit.If any questions or concerns occur regarding this medication, please contact [facility]. **For Investigational Drug Use Only**, Disp: 8 mL, Rfl: 0   Accu-Chek Softclix Lancets lancets, Use 4 times daily as needed or directed, Disp: 360 each, Rfl: 3   glucose blood (ACCU-CHEK GUIDE)  test strip, Use 4 times daily as needed or directed., Disp: 100 each, Rfl: 3   POTASSIUM CITRATE PO, Take 99 mg by mouth daily.  (Patient not taking: Reported on 06/10/2022), Disp: , Rfl:    tobramycin (TOBREX) 0.3 % ophthalmic solution, SMARTSIG:1-2 Drop(s) Right Eye 4 Times Daily (Patient not taking: Reported on 04/10/2022), Disp: , Rfl:   Current Facility-Administered Medications:    Study - VESALIUS - evolocumab (AMG 145) 140 mg/mL or placebo SQ injection (PI-Hilty), 140 mg, Subcutaneous, Q14 Days, Hilty, Lisette Abu, MD, 140 mg at 06/10/22 1001

## 2022-06-18 ENCOUNTER — Other Ambulatory Visit: Payer: Self-pay | Admitting: Family Medicine

## 2022-07-02 ENCOUNTER — Other Ambulatory Visit: Payer: Self-pay | Admitting: Cardiology

## 2022-07-02 DIAGNOSIS — I480 Paroxysmal atrial fibrillation: Secondary | ICD-10-CM

## 2022-07-02 NOTE — Telephone Encounter (Signed)
Eliquis '5mg'$  refill request received. Patient is 67 years old, weight-83.5kg, Crea-1.09 on 10/12/2021, Diagnosis-Afib, and last seen by Sande Rives on 04/17/22. Dose is appropriate based on dosing criteria. Will send in refill to requested pharmacy.

## 2022-07-09 ENCOUNTER — Telehealth: Payer: Self-pay | Admitting: Cardiology

## 2022-07-09 MED ORDER — FENOFIBRATE 160 MG PO TABS: 160.0000 mg | ORAL_TABLET | Freq: Every day | ORAL | 1 refills | Status: DC

## 2022-07-09 NOTE — Telephone Encounter (Signed)
*  STAT* If patient is at the pharmacy, call can be transferred to refill team.   1. Which medications need to be refilled? (please list name of each medication and dose if known)   fenofibrate 160 MG tablet   2. Which pharmacy/location (including street and city if local pharmacy) is medication to be sent to?  [facility] [place], [place] to Registered [facility] Sites   3. Do they need a 30 day or 90 day supply?   90 day  Patient stated she still has some of this medication.

## 2022-07-26 LAB — HEMOGLOBIN A1C: Hemoglobin A1C: 6.1

## 2022-08-05 ENCOUNTER — Other Ambulatory Visit: Payer: Self-pay | Admitting: Family Medicine

## 2022-08-07 ENCOUNTER — Encounter: Payer: Self-pay | Admitting: Family Medicine

## 2022-08-07 NOTE — Progress Notes (Unsigned)
Cardiology Office Note   Date:  08/08/2022   ID:  [name] [name], [name] [name] January 11, 1956, MRN [id number]  PCP:  Ma Hillock, DO  Cardiologist:   Minus Breeding, MD   Chief Complaint  Patient presents with   Jaw Pain       History of Present Illness: [name] [name] is a 67 y.o. female who presents for follow up of coronary artery disease status post DES to ostial/proximal LAD in 6/19 with occlusion of D1 branch    She had a low risk POET (Plain Old Exercise Treadmill) in July 2021.   She presented to the [facility] on 08/26/2020 for evaluation of chest pain.  Echocardiogram 08/27/2020 showed LVEF 65-70% .  Nuclear stress test 08/27/2020 showed no reversible ischemia or infarct, EF 76% and low risk.  She developed paroxysmal atrial fibrillation 08/27/20 and was placed on IV heparin and diltiazem gtt. She converted to sinus rhythm after 2 hours.  CHA2DS2-VASc score 5, TSH 19.14 with free T4 of 0.80.  She was symptomatic and reported jaw tingling and chest pressure with the episode.  A cardiac event monitor was ordered and showed predominantly normal sinus rhythm, sinus bradycardia, sinus tachycardia, isolated SVE's, no atrial flutter atrial fibrillation or pauses noted.  Since she was last seen she has developed some jaw pain.  This happens with exertion.  This will happen with things like putting up pine needles which she has done recently.  She is limited because of knee pain.  She says it can be 5 out of 10.  It takes a few minutes to go away.  Is been happening for 3 to 4 weeks.  Is not happening at rest.  She is not having any new shortness of breath, PND or orthopnea.  There is no new palpitations, presyncope or syncope.  There is no new edema.  Past Medical History:  Diagnosis Date   Accelerating angina ([place]) 11/04/2017   Allergy    Asthma    Atrial fibrillation (HCC)    Cataract    Chronic pain disorder    neck and shoulder   Chronic pain of right upper extremity 11/10/2017    CTS (carpal tunnel syndrome) 01/27/2013   right   Diabetes mellitus type II    Diabetic gastroparesis (HCC)    Esophageal reflux 08/25/2013   [place], Dr Barth Kirks   FATTY LIVER DISEASE 04/27/2007   Qualifier: Diagnosis of  By: Danelle Earthly CMA, Darlene     Focal muscle atrophy 09/26/2015   Fundic gland polyps of stomach, benign    Globus sensation 04/13/2018   Hordeolum externum of right upper eyelid 12/24/2021   HTN (hypertension)    Hx of adenomatous colonic polyps 12/10/2021   Hyperlipidemia, mixed 05/04/2007   Qualifier: Diagnosis of  By: Wynona Luna crestor caused myalgias even at low dose Livalo caused myalgias Lipitor  Mother with severe reaction myalgias Simvastatin, Welchol    Hypokalemia 05/25/2013   Improved stopping HCTZ. Was noted to have an elevated glucose when K was low. Recheck renal next week after starting Maxzide   Hypomagnesemia 08/10/2014   Hypothyroidism    Iron deficiency anemia 08/07/2018   Iron malabsorption 08/07/2018   Laryngitis 08/25/2017   Leg cramps 04/15/2016   Nausea without vomiting 08/08/2014   NONSPEC ELEVATION OF LEVELS OF TRANSAMINASE/LDH 05/01/2007   Qualifier: Diagnosis of  By: Garner Gavel     Osteoarthritis    chronic, right knee (11/04/2017)  Plantar fasciitis    Rosacea 10/22/2016   Urine incontinence     Past Surgical History:  Procedure Laterality Date   AUGMENTATION MAMMAPLASTY Bilateral 2002   BLADDER SUSPENSION  1981   COLONOSCOPY     CORONARY STENT INTERVENTION N/A 11/04/2017   Procedure: CORONARY STENT INTERVENTION;  Surgeon: Nelva Bush, MD;  Location: [place] [facility];  Service: Cardiovascular;  Laterality: N/A;   ENDOVENOUS ABLATION SAPHENOUS VEIN W/ LASER Left 12/29/2019   endovenous laser ablation left greater saphenous vein by Ruta Hinds MD    ESOPHAGOGASTRODUODENOSCOPY     INTRAVASCULAR PRESSURE WIRE/FFR STUDY N/A 11/04/2017   Procedure: INTRAVASCULAR PRESSURE WIRE/FFR STUDY;  Surgeon: Nelva Bush, MD;  Location: [place] [facility];  Service: Cardiovascular;  Laterality: N/A;   INTRAVASCULAR ULTRASOUND/IVUS N/A 11/04/2017   Procedure: Intravascular Ultrasound/IVUS;  Surgeon: Nelva Bush, MD;  Location: [place] [facility];  Service: Cardiovascular;  Laterality: N/A;   KNEE ARTHROSCOPY  1998, [id number]   LAPAROSCOPIC CHOLECYSTECTOMY  2007   LEFT HEART CATH AND CORONARY ANGIOGRAPHY N/A 11/04/2017   Procedure: LEFT HEART CATH AND CORONARY ANGIOGRAPHY;  Surgeon: Nelva Bush, MD;  Location: [place] [facility];  Service: Cardiovascular;  Laterality: N/A;   LEFT HEART CATHETERIZATION WITH CORONARY ANGIOGRAM N/A 11/21/2011   Procedure: LEFT HEART CATHETERIZATION WITH CORONARY ANGIOGRAM;  Surgeon: Josue Hector, MD;  Location: [facility];  Service: Cardiovascular;  Laterality: N/A;   [place]   "still have my ovaries"     Current Outpatient Medications  Medication Sig Dispense Refill   Accu-Chek Softclix Lancets lancets Use 4 times daily as needed or directed 360 each 3   Calcium Carbonate (CALTRATE 600 PO) Take 600 mg by mouth 2 (two) times daily.      Cyanocobalamin (VITAMIN B 12 PO) Take by mouth daily.     ELIQUIS 5 MG TABS tablet TAKE 1 TABLET TWICE A DAY 180 tablet 1   ezetimibe (ZETIA) 10 MG tablet Take 10 mg by mouth daily.     fenofibrate 160 MG tablet Take 1 tablet (160 mg total) by mouth daily. 90 tablet 1   glucose blood (ACCU-CHEK GUIDE) test strip Use 4 times daily as needed or directed. 100 each 3   isosorbide mononitrate (IMDUR) 30 MG 24 hr tablet Take 0.5 tablets (15 mg total) by mouth daily. 45 tablet 3   levothyroxine (SYNTHROID) 88 MCG tablet Take 88 mcg by mouth every morning.     Magnesium 250 MG TABS Take 1 tablet by mouth daily.     metFORMIN (GLUCOPHAGE) 500 MG tablet Take 500 mg by mouth 2 (two) times daily with a meal.     metoprolol tartrate (LOPRESSOR) 25 MG tablet TAKE 1 TABLET TWICE A DAY. TAKE AN  EXTRA DOSE FOR BREAK THROUGH ATRIAL FIBRILLATION WITH INCREASED HEART RATE GREATER THAN 100. 180 tablet 3   nitroGLYCERIN (NITROSTAT) 0.4 MG SL tablet Place 1 tablet (0.4 mg total) under the tongue every 5 (five) minutes as needed for chest pain. 25 tablet PRN   NOVOLIN 70/30 RELION (70-30) 100 UNIT/ML injection Inject into the skin. 25 for breakfast and 15 lunch and 36 supper     pantoprazole (PROTONIX) 40 MG tablet Take 1 tablet (40 mg total) by mouth 2 (two) times daily before a meal. 180 tablet 3   pregabalin (LYRICA) 50 MG capsule Take 1 capsule (50 mg total) by mouth 2 (two) times daily. 60 capsule 5   Probiotic  Product (PROBIOTIC DAILY PO) Take 99 mg by mouth daily.      Study - VESALIUS - evolocumab (AMG 145) 140 mg/mL or placebo SQ injection (PI-Hilty) Inject 1 mL (140 mg total) into the skin every 14 (fourteen) days. Return kit at next visit.If any questions or concerns occur regarding this medication, please contact [facility]. **For Investigational Drug Use Only** 8 mL 0   Current Facility-Administered Medications  Medication Dose Route Frequency Provider Last Rate Last Admin   Study - VESALIUS - evolocumab (AMG 145) 140 mg/mL or placebo SQ injection (PI-Hilty)  140 mg Subcutaneous Q14 Days Pixie Casino, MD   140 mg at 06/10/22 1001    Allergies:   Colesevelam, Epinephrine, Other, Statins, Welchol [colesevelam hcl], and Amoxicillin    ROS:  Please see the history of present illness.   Otherwise, review of systems are positive for none.   All other systems are reviewed and negative.    PHYSICAL EXAM: VS:  BP 116/70   Pulse (!) 58   Ht '5\' 5"'$  (1.651 m)   Wt 187 lb 6.4 oz (85 kg)   SpO2 96%   BMI 31.18 kg/m  , BMI Body mass index is 31.18 kg/m. GENERAL:  Well appearing NECK:  No jugular venous distention, waveform within normal limits, carotid upstroke brisk and symmetric, no bruits, no thyromegaly LUNGS:  Clear to auscultation bilaterally CHEST:   Unremarkable HEART:  PMI not displaced or sustained,S1 and S2 within normal limits, no S3, no S4, no clicks, no rubs, no murmurs ABD:  Flat, positive bowel sounds normal in frequency in pitch, no bruits, no rebound, no guarding, no midline pulsatile mass, no hepatomegaly, no splenomegaly EXT:  2 plus pulses throughout, no edema, no cyanosis no clubbing   EKG:  EKG is   ordered today. The ekg ordered today demonstrates sinus rhythm with possible rate 58, axis within normal limits, intervals within normal limits, no acute ST-T wave changes.   Recent Labs: 09/25/2021: TSH 0.31 10/12/2021: ALT 22; BUN 19; Creatinine, Ser 1.09; Hemoglobin 12.0; Platelets 233; Potassium 4.8; Sodium 141    Lipid Panel    Component Value Date/Time   CHOL 187 07/19/2021 0000   CHOL 228 (H) 03/31/2020 1300   TRIG 139 07/19/2021 0000   HDL 43 07/19/2021 0000   HDL 47 03/31/2020 1300   CHOLHDL 5.5 08/27/2020 0119   VLDL 51 (H) 08/27/2020 0119   LDLCALC 116 07/19/2021 0000   LDLCALC 119 (H) 03/31/2020 1300   LDLDIRECT 151.0 06/01/2015 0812      Wt Readings from Last 3 Encounters:  08/08/22 187 lb 6.4 oz (85 kg)  08/08/22 187 lb 3.2 oz (84.9 kg)  04/11/22 184 lb (83.5 kg)      Other studies Reviewed: Additional studies/ records that were reviewed today include: Labs Review of the above records demonstrates:  Please see elsewhere in the note.     ASSESSMENT AND PLAN:    Coronary artery disease:   The patient has new jaw pain that could be an anginal equivalent.  She would not be able to walk on a treadmill.  I would like to screen her with PET scanning.   Paroxysmal atrial fibrillation:   She may have some occasional symptomatic paroxysms but she is not really bothered by these of feeling these.  No change in therapy.   [name] [name] has a CHA2DS2 - VASc score of 4.  Essential hypertension:   Blood pressure is controlled.  No change in  therapy.  Dyslipidemia:       She is followed in the  VESALIUS trial.  Continue manage and follow-up per them.   Current medicines are reviewed at length with the patient today.  The patient does not have concerns regarding medicines.  The following changes have been made: None  Labs/ tests ordered today include:   No orders of the defined types were placed in this encounter.    Disposition:   FU with me in 6 months.     Signed, Minus Breeding, MD  08/08/2022 9:16 AM    [facility]

## 2022-08-07 NOTE — Progress Notes (Unsigned)
[name] [name] , 1955-10-29, 67 y.o., female MRN: [id number] Patient Care Team    Relationship Specialty Notifications Start End  Ma Hillock, DO PCP - General Family Medicine  06/03/18   Minus Breeding, MD PCP - Cardiology Cardiology Admissions 11/14/17   Gatha Mayer, MD Consulting Physician Gastroenterology  06/05/18   Marshell Garfinkel, MD Consulting Physician Pulmonary Disease  06/05/18   Jacelyn Pi, MD Referring Physician Endocrinology  09/07/20   Susa Day, MD Consulting Physician Orthopedic Surgery  10/31/21   Chanda Busing, RN (Inactive) Registered Nurse   02/27/22     No chief complaint on file.    Subjective:  [name] [name]  is a 67 y.o. female presents for Back pain: Patient was seen a little over 2 weeks ago for back pain of her thoracic spine that had started to worsen.  She reports the thoracic back pain had been present since November 2022.  She had started seeing a chiropractor routinely for the discomfort, but has not seen much change that is positive.  She noticed approximately 3 weeks ago she is having more increasing pain, pain over the thoracic spine itself and referred pain to her right axilla.  She also endorses some decreased sensation under her right axilla, that she now asked to watch when she is shaving underneath her arm because she cannot feel the razor. Tx: medrol dose pack, baclofen.  She reports the medications helped temporarily, but as soon as the steroid Dosepak was completed her pain returned very quickly. There has been no known injury or overactivity that created discomfort. NSAIDs are contraindicated with chronic anticoagulation.  No results for input(s): "HGB", "HCT", "WBC", "PLT" in the last 168 hours.     Latest Ref Rng & Units 10/12/2021   10:45 AM 07/19/2021   12:00 AM 09/06/2020   12:16 PM  CMP  Glucose 70 - 99 mg/dL 192   138   BUN 8 - 27 mg/dL '19  21     12   '$ Creatinine 0.57 - 1.00 mg/dL 1.09  1.1     1.04   Sodium 134  - 144 mmol/L 141  142     140   Potassium 3.5 - 5.2 mmol/L 4.8  4.8     4.1   Chloride 96 - 106 mmol/L 102  104     103   CO2 20 - 29 mmol/L '24  25     27   '$ Calcium 8.7 - 10.3 mg/dL 9.8  9.8     9.7   Total Protein 6.0 - 8.5 g/dL 6.9   7.0   Total Bilirubin 0.0 - 1.2 mg/dL 0.4   0.4   Alkaline Phos 44 - 121 IU/L 61  66     62   AST 0 - 40 IU/L '17  24     18   '$ ALT 0 - 32 IU/L '22  30     22      '$ This result is from an external source.        04/11/2022    2:16 PM 04/04/2021   11:48 AM 09/06/2020   11:25 AM 06/03/2018    4:19 PM 09/06/2014    8:33 AM  Depression screen PHQ 2/9  Decreased Interest 0 0 0 2 0  Down, Depressed, Hopeless 0 0 0 1 0  PHQ - 2 Score 0 0 0 3 0  Altered sleeping    0   Tired, decreased energy  2   Change in appetite    3   Feeling bad or failure about yourself     0   Trouble concentrating    0   Moving slowly or fidgety/restless    0   Suicidal thoughts    0   PHQ-9 Score    8     Allergies  Allergen Reactions   Colesevelam Other (See Comments)   Epinephrine Other (See Comments)    Pass out    Other     Other reaction(s): Myalgia   Statins Other (See Comments)    Myalgias: Simvastatin also caused back ache   Welchol [Colesevelam Hcl]     myalgia   Amoxicillin Itching and Rash    Has patient had a PCN reaction causing immediate rash, facial/tongue/throat swelling, SOB or lightheadedness with hypotension: No Has patient had a PCN reaction causing severe rash involving mucus membranes or skin necrosis: No Has patient had a PCN reaction that required hospitalization: No Has patient had a PCN reaction occurring within the last 10 years: Yes If all of the above answers are "NO", then may proceed with Cephalosporin use.    Social History   Tobacco Use   Smoking status: Never   Smokeless tobacco: Never  Substance Use Topics   Alcohol use: Not Currently   Past Medical History:  Diagnosis Date   Accelerating angina ([place]) 11/04/2017   Allergy     Asthma    Atrial fibrillation (HCC)    Cataract    Chronic pain disorder    neck and shoulder   Chronic pain of right upper extremity 11/10/2017   CTS (carpal tunnel syndrome) 01/27/2013   right   Diabetes mellitus type II    Diabetic gastroparesis (HCC)    Esophageal reflux 08/25/2013   [place], Dr Barth Kirks   FATTY LIVER DISEASE 04/27/2007   Qualifier: Diagnosis of  By: Danelle Earthly CMA, Darlene     Focal muscle atrophy 09/26/2015   Fundic gland polyps of stomach, benign    Globus sensation 04/13/2018   Hordeolum externum of right upper eyelid 12/24/2021   HTN (hypertension)    Hx of adenomatous colonic polyps 12/10/2021   Hyperlipidemia, mixed 05/04/2007   Qualifier: Diagnosis of  By: Wynona Luna crestor caused myalgias even at low dose Livalo caused myalgias Lipitor  Mother with severe reaction myalgias Simvastatin, Welchol    Hypokalemia 05/25/2013   Improved stopping HCTZ. Was noted to have an elevated glucose when K was low. Recheck renal next week after starting Maxzide   Hypomagnesemia 08/10/2014   Hypothyroidism    Iron deficiency anemia 08/07/2018   Iron malabsorption 08/07/2018   Laryngitis 08/25/2017   Leg cramps 04/15/2016   Nausea without vomiting 08/08/2014   NONSPEC ELEVATION OF LEVELS OF TRANSAMINASE/LDH 05/01/2007   Qualifier: Diagnosis of  By: Garner Gavel     Osteoarthritis    chronic, right knee (11/04/2017)   Plantar fasciitis    Rosacea 10/22/2016   Urine incontinence    Past Surgical History:  Procedure Laterality Date   AUGMENTATION MAMMAPLASTY Bilateral 2002   BLADDER SUSPENSION  1981   COLONOSCOPY     CORONARY STENT INTERVENTION N/A 11/04/2017   Procedure: CORONARY STENT INTERVENTION;  Surgeon: Nelva Bush, MD;  Location: [place] [facility];  Service: Cardiovascular;  Laterality: N/A;   ENDOVENOUS ABLATION SAPHENOUS VEIN W/ LASER Left 12/29/2019   endovenous laser ablation left greater saphenous vein by Ruta Hinds MD     ESOPHAGOGASTRODUODENOSCOPY  INTRAVASCULAR PRESSURE WIRE/FFR STUDY N/A 11/04/2017   Procedure: INTRAVASCULAR PRESSURE WIRE/FFR STUDY;  Surgeon: Nelva Bush, MD;  Location: [place] [facility];  Service: Cardiovascular;  Laterality: N/A;   INTRAVASCULAR ULTRASOUND/IVUS N/A 11/04/2017   Procedure: Intravascular Ultrasound/IVUS;  Surgeon: Nelva Bush, MD;  Location: [place] [facility];  Service: Cardiovascular;  Laterality: N/A;   KNEE ARTHROSCOPY  1998, [id number]   LAPAROSCOPIC CHOLECYSTECTOMY  2007   LEFT HEART CATH AND CORONARY ANGIOGRAPHY N/A 11/04/2017   Procedure: LEFT HEART CATH AND CORONARY ANGIOGRAPHY;  Surgeon: Nelva Bush, MD;  Location: [place] [facility];  Service: Cardiovascular;  Laterality: N/A;   LEFT HEART CATHETERIZATION WITH CORONARY ANGIOGRAM N/A 11/21/2011   Procedure: LEFT HEART CATHETERIZATION WITH CORONARY ANGIOGRAM;  Surgeon: Josue Hector, MD;  Location: [facility];  Service: Cardiovascular;  Laterality: N/A;   [place]   "still have my ovaries"   Family History  Problem Relation Age of Onset   Alzheimer's disease Mother    Diabetes type II Mother    Hiatal hernia Mother    Diabetes Mother    Emphysema Father    COPD Father    Depression Sister        suicide   Diabetes Sister    Diabetes Daughter    Colon cancer Neg Hx    Stomach cancer Neg Hx    Esophageal cancer Neg Hx    Rectal cancer Neg Hx    Allergies as of 08/08/2022       Reactions   Colesevelam Other (See Comments)   Epinephrine Other (See Comments)   Pass out    Other    Other reaction(s): Myalgia   Statins Other (See Comments)   Myalgias: Simvastatin also caused back ache   Welchol [colesevelam Hcl]    myalgia   Amoxicillin Itching, Rash   Has patient had a PCN reaction causing immediate rash, facial/tongue/throat swelling, SOB or lightheadedness with hypotension: No Has patient had a PCN reaction causing severe rash involving mucus  membranes or skin necrosis: No Has patient had a PCN reaction that required hospitalization: No Has patient had a PCN reaction occurring within the last 10 years: Yes If all of the above answers are "NO", then may proceed with Cephalosporin use.        Medication List        Accurate as of August 07, 2022  1:25 PM. If you have any questions, ask your nurse or doctor.          STOP taking these medications    baclofen 10 MG tablet Commonly known as: LIORESAL Stopped by: Howard Pouch, DO   POTASSIUM CITRATE PO Stopped by: Howard Pouch, DO   tobramycin 0.3 % ophthalmic solution Commonly known as: TOBREX Stopped by: Howard Pouch, DO       TAKE these medications    Accu-Chek Guide test strip Generic drug: glucose blood Use 4 times daily as needed or directed.   Accu-Chek Softclix Lancets lancets Use 4 times daily as needed or directed   CALTRATE 600 PO Take 600 mg by mouth 2 (two) times daily.   Eliquis 5 MG Tabs tablet Generic drug: apixaban TAKE 1 TABLET TWICE A DAY   ezetimibe 10 MG tablet Commonly known as: ZETIA Take 10 mg by mouth daily.   fenofibrate 160 MG tablet Take 1 tablet (160 mg total) by mouth daily.   isosorbide mononitrate 30 MG 24 hr tablet Commonly known as: IMDUR  Take 0.5 tablets (15 mg total) by mouth daily.   levothyroxine 88 MCG tablet Commonly known as: SYNTHROID Take 88 mcg by mouth every morning.   Magnesium 250 MG Tabs Take 1 tablet by mouth daily.   metFORMIN 500 MG tablet Commonly known as: GLUCOPHAGE Take 500 mg by mouth 2 (two) times daily with a meal.   metoprolol tartrate 25 MG tablet Commonly known as: LOPRESSOR TAKE 1 TABLET TWICE A DAY. TAKE AN EXTRA DOSE FOR BREAK THROUGH ATRIAL FIBRILLATION WITH INCREASED HEART RATE GREATER THAN 100.   nitroGLYCERIN 0.4 MG SL tablet Commonly known as: NITROSTAT Place 1 tablet (0.4 mg total) under the tongue every 5 (five) minutes as needed for chest pain.   NovoLIN 70/30  ReliOn (70-30) 100 UNIT/ML injection Generic drug: insulin NPH-regular Human Inject into the skin. 25 for breakfast and 15 lunch and 36 supper   pantoprazole 40 MG tablet Commonly known as: PROTONIX Take 1 tablet (40 mg total) by mouth 2 (two) times daily before a meal.   pregabalin 50 MG capsule Commonly known as: LYRICA Take 1 capsule (50 mg total) by mouth 2 (two) times daily.   PROBIOTIC DAILY PO Take 99 mg by mouth daily.   VESALIUS evolocumab (AMG 145) or placebo 140 mg/mL SQ injection Inject 1 mL (140 mg total) into the skin every 14 (fourteen) days. Return kit at next visit.If any questions or concerns occur regarding this medication, please contact [facility]. **For Investigational Drug Use Only**   VITAMIN B 12 PO Take by mouth daily.        All past medical history, surgical history, allergies, family history, immunizations and medications were updated in the EMR today and reviewed under the history and medication portions of their EMR.      ROS: Negative, with the exception of above mentioned in HPI   Objective:  There were no vitals taken for this visit. There is no height or weight on file to calculate BMI. Physical Exam Vitals and nursing note reviewed.  Constitutional:      General: She is not in acute distress.    Appearance: Normal appearance. She is normal weight. She is not ill-appearing or toxic-appearing.     Comments: Standing in room.  Not sitting secondary to the discomfort  Eyes:     Extraocular Movements: Extraocular movements intact.     Conjunctiva/sclera: Conjunctivae normal.     Pupils: Pupils are equal, round, and reactive to light.  Musculoskeletal:     Cervical back: Normal.     Thoracic back: Spasms, tenderness and bony tenderness present. Decreased range of motion. Scoliosis present.  Neurological:     Mental Status: She is alert and oriented to person, place, and time. Mental status is at baseline.  Psychiatric:         Mood and Affect: Mood normal.        Behavior: Behavior normal.        Thought Content: Thought content normal.        Judgment: Judgment normal.          Comments:  Red- TTP.  Green: decreased sensation.  Upper arms full range of motion and ABDuction.  Negative Hawkins.  Negative empty can test.  Neurovascularly intact distally.        Assessment/Plan: [name] [name] is a 67 y.o. female present for OV for Hospital discharge follow up Thoracic degenerative disc disease/bone spur/dextroscoliosis of thoracic spine/thoracic radiculopathy/neurosensory deficit/paresthesia/right thoracic back pain Pain present 6 to 7 months  with worsening neurosensory deficits and pain.  Did not respond to initial treatment of steroids and muscle relaxants. - Ambulatory referral to Orthopedic Surgery - CT THORACIC SPINE WO CONTRAST; Future She does have some concerning bony tenderness over her thoracic spine. X-ray completed last visit was concerning for significant bone spurring with thoracic spine degeneration.  She did not respond well to treatment with steroid pack and muscle relaxer.  Relief was temporary and then quickly returned with neurosensory deficits which are concerning. Discussed moving forward with advanced imaging and referral to orthopedics and she is agreeable to this today. Continue baclofen 5-10 mg twice daily.  She is very sensitive to medications warned on sedation precautions. Lyrica 50 mg twice daily prescribed. Referral to orthopedics-Dr. Ramo's completed today.  She is a patient of [facility] and her husband sees Dr. Herma Mering. CT thoracic spine without contrast ordered today. Follow-up dependent upon clinical outcome, imaging and if she is able to establish with orthopedic quickly.  Reviewed expectations re: course of current medical issues. Discussed self-management of symptoms. Outlined signs and symptoms indicating need for more acute intervention. Patient verbalized  understanding and all questions were answered. Patient received an After-Visit Summary. Any changes in medications were reviewed and patient was provided with updated med list with their AVS.    No orders of the defined types were placed in this encounter.   No orders of the defined types were placed in this encounter.    Note is dictated utilizing voice recognition software. Although note has been proof read prior to signing, occasional typographical errors still can be missed. If any questions arise, please do not hesitate to call for verification.   electronically signed by:  Howard Pouch, DO  [place]

## 2022-08-08 ENCOUNTER — Encounter: Payer: Self-pay | Admitting: Family Medicine

## 2022-08-08 ENCOUNTER — Encounter: Payer: Self-pay | Admitting: Cardiology

## 2022-08-08 ENCOUNTER — Ambulatory Visit (INDEPENDENT_AMBULATORY_CARE_PROVIDER_SITE_OTHER): Payer: Medicare Other | Admitting: Family Medicine

## 2022-08-08 ENCOUNTER — Ambulatory Visit: Payer: Medicare Other | Attending: Cardiology | Admitting: Cardiology

## 2022-08-08 VITALS — BP 136/68 | HR 55 | Temp 97.7°F | Wt 187.2 lb

## 2022-08-08 VITALS — BP 116/70 | HR 58 | Ht 65.0 in | Wt 187.4 lb

## 2022-08-08 DIAGNOSIS — M5414 Radiculopathy, thoracic region: Secondary | ICD-10-CM

## 2022-08-08 DIAGNOSIS — Z9861 Coronary angioplasty status: Secondary | ICD-10-CM | POA: Insufficient documentation

## 2022-08-08 DIAGNOSIS — R079 Chest pain, unspecified: Secondary | ICD-10-CM | POA: Diagnosis present

## 2022-08-08 DIAGNOSIS — I480 Paroxysmal atrial fibrillation: Secondary | ICD-10-CM | POA: Insufficient documentation

## 2022-08-08 DIAGNOSIS — M4184 Other forms of scoliosis, thoracic region: Secondary | ICD-10-CM

## 2022-08-08 DIAGNOSIS — M5134 Other intervertebral disc degeneration, thoracic region: Secondary | ICD-10-CM

## 2022-08-08 DIAGNOSIS — R2990 Unspecified symptoms and signs involving the nervous system: Secondary | ICD-10-CM

## 2022-08-08 DIAGNOSIS — M546 Pain in thoracic spine: Secondary | ICD-10-CM

## 2022-08-08 DIAGNOSIS — I2510 Atherosclerotic heart disease of native coronary artery without angina pectoris: Secondary | ICD-10-CM | POA: Diagnosis present

## 2022-08-08 DIAGNOSIS — E785 Hyperlipidemia, unspecified: Secondary | ICD-10-CM | POA: Diagnosis present

## 2022-08-08 DIAGNOSIS — I10 Essential (primary) hypertension: Secondary | ICD-10-CM | POA: Diagnosis present

## 2022-08-08 DIAGNOSIS — R202 Paresthesia of skin: Secondary | ICD-10-CM

## 2022-08-08 DIAGNOSIS — M779 Enthesopathy, unspecified: Secondary | ICD-10-CM | POA: Diagnosis not present

## 2022-08-08 MED ORDER — PREGABALIN 50 MG PO CAPS: 50.0000 mg | ORAL_CAPSULE | Freq: Two times a day (BID) | ORAL | 5 refills | Status: DC

## 2022-08-08 NOTE — Patient Instructions (Addendum)
Medication Instructions:   Your physician recommends that you continue on your current medications as directed. Please refer to the Current Medication list given to you today.  *If you need a refill on your cardiac medications before your next appointment, please call your pharmacy*  Lab Work: NONE ordered at this time of appointment   If you have labs (blood work) drawn today and your tests are completely normal, you will receive your results only by: [place] (if you have MyChart) OR A paper copy in the mail If you have any lab test that is abnormal or we need to change your treatment, we will call you to review the results.  Testing/Procedures: CARDIAC PET- Your physician has requested that you have a Cardiac Pet Stress Test. This testing is completed at [facility] ([place], [place] [address]). The schedulers will call you to get this scheduled. Please follow instructions below and call the office with any questions/concerns [phone]).  Follow-Up: At [facility], you and your health needs are our priority.  As part of our continuing mission to provide you with exceptional heart care, we have created designated Provider Care Teams.  These Care Teams include your primary Cardiologist (physician) and Advanced Practice Providers (APPs -  Physician Assistants and Nurse Practitioners) who all work together to provide you with the care you need, when you need it.  Your next appointment:   6 month(s)  Provider:   Minus Breeding, MD     Other Instructions  How to Prepare for Your Cardiac PET/CT Stress Test:  1. Please do not take these medications before your test:   Medications that may interfere with the cardiac pharmacological stress agent (ex. nitrates - including erectile dysfunction medications, isosorbide mononitrate, tamulosin or beta-blockers) the day of the exam. (Erectile dysfunction medication should be held for at  least 72 hrs prior to test) Theophylline containing medications for 12 hours. Dipyridamole 48 hours prior to the test. Your remaining medications may be taken with water.  2. Nothing to eat or drink, except water, 3 hours prior to arrival time.   NO caffeine/decaffeinated products, or chocolate 12 hours prior to arrival.  3. NO perfume, cologne or lotion  4. Total time is 1 to 2 hours; you may want to bring reading material for the waiting time.  5. Please report to Radiology at the [facility] Main Entrance 30 minutes early for your test.  [place], [place] [address]  Diabetic Preparation:  Hold oral medications. You may take NPH and Lantus insulin. Do not take Humalog or Humulin R (Regular Insulin) the day of your test. Check blood sugars prior to leaving the house. If able to eat breakfast prior to 3 hour fasting, you may take all medications, including your insulin, Do not worry if you miss your breakfast dose of insulin - start at your next meal.  IF YOU THINK YOU MAY BE PREGNANT, OR ARE NURSING PLEASE INFORM THE TECHNOLOGIST.  In preparation for your appointment, medication and supplies will be purchased.  Appointment availability is limited, so if you need to cancel or reschedule, please call the Radiology Department at [phone]  24 hours in advance to avoid a cancellation fee of $100.00  What to Expect After you Arrive:  Once you arrive and check in for your appointment, you will be taken to a preparation room within the Radiology Department.  A technologist or Nurse will obtain your medical history, verify that  you are correctly prepped for the exam, and explain the procedure.  Afterwards,  an IV will be started in your arm and electrodes will be placed on your skin for EKG monitoring during the stress portion of the exam. Then you will be escorted to the PET/CT scanner.  There, staff will get you positioned on the scanner and obtain a blood  pressure and EKG.  During the exam, you will continue to be connected to the EKG and blood pressure machines.  A small, safe amount of a radioactive tracer will be injected in your IV to obtain a series of pictures of your heart along with an injection of a stress agent.    After your Exam:  It is recommended that you eat a meal and drink a caffeinated beverage to counter act any effects of the stress agent.  Drink plenty of fluids for the remainder of the day and urinate frequently for the first couple of hours after the exam.  Your doctor will inform you of your test results within 7-10 business days.  For questions about your test or how to prepare for your test, please call: Marchia Bond, Cardiac Imaging Nurse Navigator  Gordy Clement, Cardiac Imaging Nurse Navigator Office: [phone]

## 2022-08-08 NOTE — Patient Instructions (Signed)
No follow-ups on file.        Great to see you today.  I have refilled the medication(s) we provide.   If labs were collected, we will inform you of lab results once received either by echart message or telephone call.   - echart message- for normal results that have been seen by the patient already.   - telephone call: abnormal results or if patient has not viewed results in their echart.  

## 2022-08-13 ENCOUNTER — Ambulatory Visit (HOSPITAL_COMMUNITY)
Admission: RE | Admit: 2022-08-13 | Discharge: 2022-08-13 | Disposition: A | Discharge status: Home / Self Care | Payer: Medicare Other | Source: Ambulatory Visit | Attending: Cardiology | Admitting: Cardiology

## 2022-08-13 DIAGNOSIS — R079 Chest pain, unspecified: Secondary | ICD-10-CM | POA: Insufficient documentation

## 2022-08-13 LAB — NM PET CT CARDIAC PERFUSION MULTI W/ABSOLUTE BLOODFLOW
MBFR: 2.25
Nuc Rest EF: 59 %
Nuc Stress EF: 69 %
Rest MBF: 0.77 ml/g/min
Stress MBF: 1.73 ml/g/min
TID: 1.09

## 2022-08-13 MED ORDER — RUBIDIUM RB82 GENERATOR (RUBYFILL)
20.0000 | PACK | Freq: Once | INTRAVENOUS | Status: AC
  Administered 2022-08-13: 21.9 via INTRAVENOUS

## 2022-08-13 MED ORDER — REGADENOSON 0.4 MG/5ML IV SOLN: 0.4000 mg | Freq: Once | INTRAVENOUS | Status: AC

## 2022-08-13 MED ORDER — CAFFEINE CITRATE BASE COMPONENT 10 MG/ML IV SOLN
INTRAVENOUS | Status: AC
  Filled 2022-08-13: qty 3

## 2022-08-13 MED ORDER — DEXTROSE 5 % IV SOLN
INTRAVENOUS | Status: AC
  Filled 2022-08-13: qty 50

## 2022-08-13 MED ORDER — REGADENOSON 0.4 MG/5ML IV SOLN
INTRAVENOUS | Status: AC
  Administered 2022-08-13: 0.4 mg via INTRAVENOUS
  Filled 2022-08-13: qty 5

## 2022-08-13 MED ORDER — RUBIDIUM RB82 GENERATOR (RUBYFILL)
20.0000 | PACK | Freq: Once | INTRAVENOUS | Status: AC
  Administered 2022-08-13: 21.98 via INTRAVENOUS

## 2022-08-13 NOTE — Progress Notes (Signed)
Tolerated test well 

## 2022-08-15 ENCOUNTER — Telehealth: Payer: Self-pay | Admitting: *Deleted

## 2022-08-15 MED ORDER — ISOSORBIDE MONONITRATE ER 30 MG PO TB24: 30.0000 mg | ORAL_TABLET | Freq: Every day | ORAL | 3 refills | Status: DC

## 2022-08-15 NOTE — Telephone Encounter (Signed)
Spoke with pt, aware of results and recommendations.  New script sent to the pharmacy  Follow up scheduled

## 2022-08-15 NOTE — Telephone Encounter (Signed)
-----   Message from Minus Breeding, MD sent at 08/15/2022 10:56 AM EDT ----- There was some mild ischemia in the anterior wall.  Overall this was not a high risk study.  I would like to start with increasing the Imdur to 30 mg.  Seems like she is taking a pill right now.  I would like her to have an appointment with me for follow-up in a couple of weeks to further discuss her symptoms.  She might need further cardiovascular testing.  Call Ms. Holloran with the results and send results to [facility], [place], DO

## 2022-08-28 NOTE — Progress Notes (Unsigned)
  Cardiology Office Note:   Date:  08/29/2022  ID:  [name] [name], DOB [birth year], MRN [id number]  History of Present Illness:   [name] [name] is a 67 y.o. female who presents for follow up of coronary artery disease status post DES to ostial/proximal LAD in 6/19 with occlusion of D1 branch    She had a low risk POET (Plain Old Exercise Treadmill) in July 2021.   She presented to the [facility] on 08/26/2020 for evaluation of chest pain.  Echocardiogram 08/27/2020 showed LVEF 65-70% .  Nuclear stress test 08/27/2020 showed no reversible ischemia or infarct, EF 76% and low risk.  She developed paroxysmal atrial fibrillation 08/27/20 and was placed on IV heparin and diltiazem gtt. She converted to sinus rhythm after 2 hours.  CHA2DS2-VASc score 5, TSH 19.14 with free T4 of 0.80.  She was symptomatic and reported jaw tingling and chest pressure with the episode.  A cardiac event monitor was ordered and showed predominantly normal sinus rhythm, sinus bradycardia, sinus tachycardia, isolated SVE's, no atrial flutter atrial fibrillation or pauses noted.   At the last visit she had some jaw pain.  She had mild ischemia in the anterior wall but this was not a high risk findings.  Since I last saw her she has had none of that jaw pain.  She has not been particularly active.  She cannot exercise because of her knees.  The patient denies any new symptoms such as chest discomfort, neck or arm discomfort. There has been no new shortness of breath, PND or orthopnea. There have been no reported palpitations, presyncope or syncope.   ROS: As stated in the HPI and negative for all other systems.  Studies Reviewed:    EKG: NA  Risk Assessment/Calculations:    CHA2DS2-VASc Score = 5   This indicates a 7.2% annual risk of stroke. The patient's score is based upon: CHF History: 0 HTN History: 1 Diabetes History: 1 Stroke History: 0 Vascular Disease History: 1 Age Score: 1 Gender Score: 1             Physical Exam:   VS:  BP 118/68   Pulse 72   Ht 5\' 5"  (1.651 m)   Wt 189 lb (85.7 kg)   SpO2 97%   BMI 31.45 kg/m    Wt Readings from Last 3 Encounters:  08/29/22 189 lb (85.7 kg)  08/08/22 187 lb 6.4 oz (85 kg)  08/08/22 187 lb 3.2 oz (84.9 kg)     GEN: Well nourished, well developed in no acute distress NECK: No JVD; No carotid bruits CARDIAC: RRR, no murmurs, rubs, gallops RESPIRATORY:  Clear to auscultation without rales, wheezing or rhonchi  ABDOMEN: Soft, non-tender, non-distended EXTREMITIES:  No edema; No deformity   ASSESSMENT AND PLAN:   Coronary artery disease:   She is not having any further symptoms and has no high risk findings on her perfusion study.  We will continue aggressive risk reduction.  I have talked to her at length about an exercise regimen.    Paroxysmal atrial fibrillation:    She feels short paroxysms of tolerates anticoagulation.  No change in therapy.   Essential hypertension:   Blood pressure is at target.  No change in therapy.   Dyslipidemia:       She is followed in the VESALIUS trial.       Signed, Minus Breeding, MD

## 2022-08-29 ENCOUNTER — Encounter: Payer: Self-pay | Admitting: Cardiology

## 2022-08-29 ENCOUNTER — Ambulatory Visit: Payer: Medicare Other | Attending: Cardiology | Admitting: Cardiology

## 2022-08-29 VITALS — BP 118/68 | HR 72 | Ht 65.0 in | Wt 189.0 lb

## 2022-08-29 DIAGNOSIS — I480 Paroxysmal atrial fibrillation: Secondary | ICD-10-CM | POA: Diagnosis present

## 2022-08-29 DIAGNOSIS — E785 Hyperlipidemia, unspecified: Secondary | ICD-10-CM | POA: Diagnosis present

## 2022-08-29 DIAGNOSIS — I10 Essential (primary) hypertension: Secondary | ICD-10-CM

## 2022-08-29 DIAGNOSIS — I2510 Atherosclerotic heart disease of native coronary artery without angina pectoris: Secondary | ICD-10-CM

## 2022-08-29 NOTE — Patient Instructions (Signed)
Medication Instructions:  Your physician recommends that you continue on your current medications as directed. Please refer to the Current Medication list given to you today.  *If you need a refill on your cardiac medications before your next appointment, please call your pharmacy*  Follow-Up: At [facility], you and your health needs are our priority.  As part of our continuing mission to provide you with exceptional heart care, we have created designated Provider Care Teams.  These Care Teams include your primary Cardiologist (physician) and Advanced Practice Providers (APPs -  Physician Assistants and Nurse Practitioners) who all work together to provide you with the care you need, when you need it.  We recommend signing up for the patient portal called "MyChart".  Sign up information is provided on this After Visit Summary.  MyChart is used to connect with patients for Virtual Visits (Telemedicine).  Patients are able to view lab/test results, encounter notes, upcoming appointments, etc.  Non-urgent messages can be sent to your provider as well.   To learn more about what you can do with MyChart, go to [email or web address].    Your next appointment:   12 month(s)  Provider:   James Hochrein, MD     

## 2022-09-05 ENCOUNTER — Ambulatory Visit: Payer: Self-pay

## 2022-09-05 NOTE — Patient Instructions (Signed)
Visit Information  Thank you for taking time to visit with me today. Please don't hesitate to contact me if I can be of assistance to you.   Following are the goals we discussed today:  -Contact your primary care provider as needed   If you are experiencing a Mental Health or Behavioral Health Crisis or need someone to talk to, please go to [facility] [address] [place] [phone])  Patient verbalizes understanding of instructions and care plan provided today and agrees to view in MyChart. Active MyChart status and patient understanding of how to access instructions and care plan via MyChart confirmed with patient.     No further follow up required: Please contact your provider as needed. The Care Coordination team is available to assist you in the future if desired.   Bevelyn Ngo, BSW, CDP Social Worker, Certified Dementia Practitioner [facility] Care Management  Care Coordination [phone]

## 2022-09-05 NOTE — Patient Outreach (Signed)
  Care Coordination   Initial Visit Note   09/05/2022 Name: MYNDEE CREQUE MRN: [id number] DOB: 1955/09/16  GIAVANNI KASH is a 67 y.o. year old female who sees Kuneff, [name], DO for primary care. I spoke with  Christella Hartigan by phone today.  What matters to the patients health and wellness today?  No concerns during today's outreach    Goals Addressed             This Visit's Progress    COMPLETED: Care Coordination Activities       Care Coordination Interventions: SDoH screening performed - no acute resource challenges identified at this time Discussed the patient does not have difficulty affording most medications. Patient is on Eliquis which is very costly. Patient notes after filling Eliquis this month she will have fulfilled her out of pocket spending amount required to qualify for patient assistance Education provided on the role of the care coordination team - no follow up desired at this time Encouraged the patient to contact her primary care provider as needed         SDOH assessments and interventions completed:  Yes  SDOH Interventions Today    Flowsheet Row Most Recent Value  SDOH Interventions   Food Insecurity Interventions Intervention Not Indicated  Housing Interventions Intervention Not Indicated  Transportation Interventions Intervention Not Indicated  Utilities Interventions Intervention Not Indicated        Care Coordination Interventions:  Yes, provided   Interventions Today    Flowsheet Row Most Recent Value  Chronic Disease   Chronic disease during today's visit Hypertension (HTN), Diabetes  General Interventions   General Interventions Discussed/Reviewed General Interventions Discussed, Doctor Visits  Doctor Visits Discussed/Reviewed Doctor Visits Reviewed  Education Interventions   Education Provided Provided Education  Provided Verbal Education On Other  [role of Care Coordination team]        Follow up plan: No further  intervention required.   Encounter Outcome:  Pt. Visit Completed   Bevelyn Ngo, BSW, CDP Social Worker, Certified Dementia Practitioner [facility] Care Management  Care Coordination [phone]

## 2022-09-30 ENCOUNTER — Telehealth: Payer: Self-pay

## 2022-09-30 ENCOUNTER — Encounter: Payer: Self-pay | Admitting: Internal Medicine

## 2022-09-30 ENCOUNTER — Other Ambulatory Visit: Payer: Self-pay

## 2022-09-30 MED ORDER — PANTOPRAZOLE SODIUM 40 MG PO TBEC: 40.0000 mg | DELAYED_RELEASE_TABLET | Freq: Two times a day (BID) | ORAL | 3 refills | Status: DC

## 2022-09-30 NOTE — Telephone Encounter (Signed)
Called pt-LMOM to with appointment date and time and parking code.

## 2022-09-30 NOTE — Telephone Encounter (Signed)
Per patient request pantoprazole refilled to [facility] mail order pharmacy.

## 2022-10-01 DIAGNOSIS — Z006 Encounter for examination for normal comparison and control in clinical research program: Secondary | ICD-10-CM

## 2022-10-01 MED ORDER — STUDY - VESALIUS - EVOLOCUMAB (AMG 145) 140 MG/ML OR PLACEBO SQ INJECTION (PI-HILTY)
140.0000 mg | INJECTION | SUBCUTANEOUS | Status: AC
  Administered 2022-10-01: 140 mg via SUBCUTANEOUS
  Filled 2022-10-01: qty 1

## 2022-10-01 MED ORDER — STUDY - VESALIUS - EVOLOCUMAB (AMG 145) 140 MG/ML OR PLACEBO SQ INJECTION (PI-HILTY): 140.0000 mg | INJECTION | SUBCUTANEOUS | 0 refills | Status: DC

## 2022-10-01 NOTE — Research (Signed)
Vesalius week 128  Patient was seen in the research clinic today for week 128 of the Vesalius trial. Denies any adverse events or hospitalizations since last visit. Reviewed all concomitant medications. Patient gave herself the IP and was able to correctly demonstrate without any assistance. Next appt made for August 20th at 9:00am.         Non-Fatal Potential Endpoint Assessment Yes  No   Has the subject experienced/undergone any of the following since the last visit/contact?   []   [x]    Any Coronary Artery Revascularization/Cerebrovascular Revascularization/ Peripheral Artery Revascularization/Amputation Procedure   []   [x]    Myocardial Infarction []   [x]    Stroke   []   x  Provide the date for the non-fatal Potential Endpoints status:   []   [x]        Treatment compliance: Date returned: 10/01/2022 Number returned from box#  3    returned from [id number]   Number returned from box#  4   returned from [id number] Number returned-total:   7 Number of Full administrations: 7   Number of partial administrations: 0 Number of non administrations: 0 Number of unknown administrations: 0   Reason for any partial or non administrations: N/A Number due to non compliance: N/A Number due to device complaint: N/A Number due to other: N/A   IP Dispensation: Date dispensed 10/01/2022 Number dispensed- total: 8 Number dispensed home to patient: 7 Box # [id number] Box # [id number]   IP Administration in Clinic: Box # [id number] Administration time  10:00 [x] AM [] PM in clinic Quantity Administered    [] None [] Partial [x] Full Reason for None or Partial Dose ____ Administration Site [x] Abdomen [] Arm [] Thigh Laterality [] Left  [x] Right Administered by [x] Patient [] Caregiver   Current Outpatient Medications:    Calcium Carbonate (CALTRATE 600 PO), Take 600 mg by mouth 2 (two) times daily. , Disp: , Rfl:    Cyanocobalamin (VITAMIN B 12 PO), Take by mouth daily., Disp: , Rfl:    ELIQUIS  5 MG TABS tablet, TAKE 1 TABLET TWICE A DAY, Disp: 180 tablet, Rfl: 1   ezetimibe (ZETIA) 10 MG tablet, Take 10 mg by mouth daily., Disp: , Rfl:    fenofibrate 160 MG tablet, Take 1 tablet (160 mg total) by mouth daily., Disp: 90 tablet, Rfl: 1   isosorbide mononitrate (IMDUR) 30 MG 24 hr tablet, Take 1 tablet (30 mg total) by mouth daily., Disp: 90 tablet, Rfl: 3   levothyroxine (SYNTHROID) 88 MCG tablet, Take 88 mcg by mouth every morning., Disp: , Rfl:    Magnesium 250 MG TABS, Take 1 tablet by mouth daily., Disp: , Rfl:    metFORMIN (GLUCOPHAGE) 500 MG tablet, Take 500 mg by mouth 2 (two) times daily with a meal., Disp: , Rfl:    metoprolol tartrate (LOPRESSOR) 25 MG tablet, TAKE 1 TABLET TWICE A DAY. TAKE AN EXTRA DOSE FOR BREAK THROUGH ATRIAL FIBRILLATION WITH INCREASED HEART RATE GREATER THAN 100., Disp: 180 tablet, Rfl: 3   nitroGLYCERIN (NITROSTAT) 0.4 MG SL tablet, Place 1 tablet (0.4 mg total) under the tongue every 5 (five) minutes as needed for chest pain., Disp: 25 tablet, Rfl: PRN   NOVOLIN 70/30 KWIKPEN (70-30) 100 UNIT/ML KwikPen, 24uam/15ulunch/30usupper Subcutaneous Three times a day for 90 days, Disp: , Rfl:    pantoprazole (PROTONIX) 40 MG tablet, Take 1 tablet (40 mg total) by mouth 2 (two) times daily before a meal., Disp: 180 tablet, Rfl: 3   pregabalin (LYRICA) 50 MG capsule, Take 1 capsule (50 mg total) by  mouth 2 (two) times daily., Disp: 60 capsule, Rfl: 5   Probiotic Product (PROBIOTIC DAILY PO), Take 99 mg by mouth daily. , Disp: , Rfl:    Study - VESALIUS - evolocumab (AMG 145) 140 mg/mL or placebo SQ injection (PI-Hilty), Inject 1 mL (140 mg total) into the skin every 14 (fourteen) days. Return kit at next visit.If any questions or concerns occur regarding this medication, please contact [facility]. **For Investigational Drug Use Only**, Disp: 8 mL, Rfl: 0   Accu-Chek Softclix Lancets lancets, Use 4 times daily as needed or directed, Disp: 360 each, Rfl: 3    glucose blood (ACCU-CHEK GUIDE) test strip, Use 4 times daily as needed or directed., Disp: 100 each, Rfl: 3

## 2022-10-02 NOTE — Research (Signed)
       Non-Fatal Potential Endpoint Assessment Yes  No   Has the subject experienced/undergone any of the following since the last visit/contact?   []  [x]   Any Coronary Artery Revascularization/Cerebrovascular Revascularization/ Peripheral Artery Revascularization/Amputation Procedure   []  [x]   Myocardial Infarction []  [x]   Stroke   []  x  Provide the date for the non-fatal Potential Endpoints status:   []  [x]    

## 2022-10-16 ENCOUNTER — Telehealth: Payer: Self-pay | Admitting: Cardiology

## 2022-10-16 NOTE — Telephone Encounter (Signed)
Paper Work Dropped Off: Patient Assistance  Date: 10/16/2022  Location of paper:  Provider Mailbox

## 2022-10-17 ENCOUNTER — Encounter: Payer: Self-pay | Admitting: *Deleted

## 2022-10-17 NOTE — Telephone Encounter (Signed)
Patient assistance paper work for eliquis faxed to [employer].

## 2022-10-29 ENCOUNTER — Telehealth: Payer: Self-pay

## 2022-10-29 NOTE — Telephone Encounter (Signed)
   Pre-operative Risk Assessment    Patient Name: [name] [name]  DOB: [birth year] MRN: [id number]      Request for Surgical Clearance    Procedure:   ACDF C5-7  Date of Surgery:  Clearance TBD                                 Surgeon:  DR. [facility] BROOKS Surgeon's Group or Practice Name:  [facility] Phone number:  [phone] Fax number:  [phone]   Type of Clearance Requested:   - Pharmacy:  Hold Apixaban (Eliquis) NEEDS INSTRUCTION WHEN TO HOLD   Type of Anesthesia:  General    Additional requests/questions:    SignedMichaelle Copas   10/29/2022, 3:16 PM

## 2022-10-30 NOTE — Telephone Encounter (Signed)
   Name: [name] [name]  DOB: [birth year]  MRN: [id number]  Primary Cardiologist: Rollene Rotunda, MD   Preoperative team, please contact this patient and set up a phone call appointment for further preoperative risk assessment. Please obtain consent and complete medication review. Thank you for your help.  I confirm that guidance regarding antiplatelet and oral anticoagulation therapy has been completed and, if necessary, noted below.  Per Pharm.D. can hold Eliquis for 3 days prior to procedure.   Carlos Levering, NP 10/30/2022, 3:14 PM [facility]

## 2022-10-30 NOTE — Telephone Encounter (Signed)
Please advise holding Eliquis prior to ACDF C5-7.  Thank you!  DW

## 2022-10-30 NOTE — Telephone Encounter (Signed)
Left message to call back to set up tele pre op appt.  

## 2022-10-30 NOTE — Telephone Encounter (Signed)
Patient with diagnosis of afib on Eliquis for anticoagulation.    Procedure: ACDF C5-7 Date of procedure: TBD  CHA2DS2-VASc Score = 5  This indicates a 7.2% annual risk of stroke. The patient's score is based upon: CHF History: 0 HTN History: 1 Diabetes History: 1 Stroke History: 0 Vascular Disease History: 1 Age Score: 1 Gender Score: 1  CrCl 70mL/min using adj body weight Platelet count 233K  Per office protocol, patient can hold Eliquis for 3 days prior to procedure.    **This guidance is not considered finalized until pre-operative APP has relayed final recommendations.**

## 2022-10-31 ENCOUNTER — Telehealth: Payer: Self-pay

## 2022-10-31 NOTE — Telephone Encounter (Signed)
I contacted pt to schedule tele appt. No answer [id number]

## 2022-10-31 NOTE — Telephone Encounter (Signed)
Pt returned call. Appt is scheduled 6/18

## 2022-10-31 NOTE — Telephone Encounter (Signed)
Pt returned call. Appt is scheduled 6/18     Patient Consent for Virtual Visit        ODEAL LESNIK has provided verbal consent on 10/31/2022 for a virtual visit (video or telephone).   CONSENT FOR VIRTUAL VISIT FOR:  [name] [name]  By participating in this virtual visit I agree to the following:  I hereby voluntarily request, consent and authorize [facility] and its employed or contracted physicians, physician assistants, nurse practitioners or other licensed health care professionals (the Practitioner), to provide me with telemedicine health care services (the "Services") as deemed necessary by the treating Practitioner. I acknowledge and consent to receive the Services by the Practitioner via telemedicine. I understand that the telemedicine visit will involve communicating with the Practitioner through live audiovisual communication technology and the disclosure of certain medical information by electronic transmission. I acknowledge that I have been given the opportunity to request an in-person assessment or other available alternative prior to the telemedicine visit and am voluntarily participating in the telemedicine visit.  I understand that I have the right to withhold or withdraw my consent to the use of telemedicine in the course of my care at any time, without affecting my right to future care or treatment, and that the Practitioner or I may terminate the telemedicine visit at any time. I understand that I have the right to inspect all information obtained and/or recorded in the course of the telemedicine visit and may receive copies of available information for a reasonable fee.  I understand that some of the potential risks of receiving the Services via telemedicine include:  Delay or interruption in medical evaluation due to technological equipment failure or disruption; Information transmitted may not be sufficient (e.g. poor resolution of images) to allow for  appropriate medical decision making by the Practitioner; and/or  In rare instances, security protocols could fail, causing a breach of personal health information.  Furthermore, I acknowledge that it is my responsibility to provide information about my medical history, conditions and care that is complete and accurate to the best of my ability. I acknowledge that Practitioner's advice, recommendations, and/or decision may be based on factors not within their control, such as incomplete or inaccurate data provided by me or distortions of diagnostic images or specimens that may result from electronic transmissions. I understand that the practice of medicine is not an exact science and that Practitioner makes no warranties or guarantees regarding treatment outcomes. I acknowledge that a copy of this consent can be made available to me via my patient portal [facility] MyChart), or I can request a printed copy by calling the office of [facility].    I understand that my insurance will be billed for this visit.   I have read or had this consent read to me. I understand the contents of this consent, which adequately explains the benefits and risks of the Services being provided via telemedicine.  I have been provided ample opportunity to ask questions regarding this consent and the Services and have had my questions answered to my satisfaction. I give my informed consent for the services to be provided through the use of telemedicine in my medical care

## 2022-11-05 ENCOUNTER — Encounter: Payer: Self-pay | Admitting: Family Medicine

## 2022-11-05 ENCOUNTER — Ambulatory Visit (INDEPENDENT_AMBULATORY_CARE_PROVIDER_SITE_OTHER): Payer: Medicare Other | Admitting: Family Medicine

## 2022-11-05 VITALS — BP 128/77 | HR 67 | Temp 97.9°F | Wt 185.4 lb

## 2022-11-05 DIAGNOSIS — I480 Paroxysmal atrial fibrillation: Secondary | ICD-10-CM | POA: Diagnosis not present

## 2022-11-05 DIAGNOSIS — E038 Other specified hypothyroidism: Secondary | ICD-10-CM

## 2022-11-05 DIAGNOSIS — D6869 Other thrombophilia: Secondary | ICD-10-CM | POA: Diagnosis not present

## 2022-11-05 DIAGNOSIS — E063 Autoimmune thyroiditis: Secondary | ICD-10-CM

## 2022-11-05 DIAGNOSIS — M5412 Radiculopathy, cervical region: Secondary | ICD-10-CM | POA: Diagnosis not present

## 2022-11-05 NOTE — Progress Notes (Signed)
[name] [name] , April 23, 1956, 67 y.o., female MRN: [id number] Patient Care Team    Relationship Specialty Notifications Start End  Natalia Leatherwood, DO PCP - General Family Medicine  06/03/18   Rollene Rotunda, MD PCP - Cardiology Cardiology Admissions 11/14/17   Iva Boop, MD Consulting Physician Gastroenterology  06/05/18   Chilton Greathouse, MD Consulting Physician Pulmonary Disease  06/05/18   Dorisann Frames, MD Referring Physician Endocrinology  09/07/20   Jene Every, MD Consulting Physician Orthopedic Surgery  10/31/21   Ermalene Postin, RN (Inactive) Registered Nurse   02/27/22     Chief Complaint  Patient presents with   surgical consult     Subjective: [name] [name] is a 67 y.o. Pt presents for an OV to discuss surgical risk assessment for upcoming surgery scheduled "TBD" Procedure:Cervical Fusion (C5-6, C6-7) Indication:cervical radiculopathy/pain Anesthesia:General Surgery type risk:   - Intermediate risk= orthopedics Prior anesthesia complications: No history of complications of anesthesia Family history of prior anesthesia complications: No known history of complications Cardiac: has clearance schedule 11/12/2022 with cardio   - CAD s/p angioplasty, a.fib , htn, HLD Pulmonary:asthma history - well controlled, not needed inhalers in years Endocrine: thyroid disorder - tsh normal and Diabetes- A1c 6.7 (11/02/2022- Dr.Balan) Obesity:Body mass index is 30.85 kg/m. Chronic kidney disease: no prior history last GFR normal Chronic med that needs to be continued: lyrica, insulin, beta blocker. levothyroxine Anticoagulation: eliquis-managed by cardio      04/11/2022    2:16 PM 04/04/2021   11:48 AM 09/06/2020   11:25 AM 06/03/2018    4:19 PM 09/06/2014    8:33 AM  Depression screen PHQ 2/9  Decreased Interest 0 0 0 2 0  Down, Depressed, Hopeless 0 0 0 1 0  PHQ - 2 Score 0 0 0 3 0  Altered sleeping    0   Tired, decreased energy    2   Change in appetite    3    Feeling bad or failure about yourself     0   Trouble concentrating    0   Moving slowly or fidgety/restless    0   Suicidal thoughts    0   PHQ-9 Score    8     Allergies  Allergen Reactions   Colesevelam Other (See Comments)   Epinephrine Other (See Comments)    Pass out    Other     Other reaction(s): Myalgia   Statins Other (See Comments)    Myalgias: Simvastatin also caused back ache   Welchol [Colesevelam Hcl]     myalgia   Amoxicillin Itching and Rash    Has patient had a PCN reaction causing immediate rash, facial/tongue/throat swelling, SOB or lightheadedness with hypotension: No Has patient had a PCN reaction causing severe rash involving mucus membranes or skin necrosis: No Has patient had a PCN reaction that required hospitalization: No Has patient had a PCN reaction occurring within the last 10 years: Yes If all of the above answers are "NO", then may proceed with Cephalosporin use.    Social History   Social History Narrative   Marital status/children/pets: Married, 1 child.    Education/employment: HS   Safety:      -smoke alarm in the home:Yes     - wears seatbelt: Yes     - Feels safe in their relationships: Yes   Past Medical History:  Diagnosis Date   Accelerating angina (HCC) 11/04/2017   Allergy  Asthma    Atrial fibrillation (HCC)    Cataract    Chronic pain disorder    neck and shoulder   Chronic pain of right upper extremity 11/10/2017   CTS (carpal tunnel syndrome) 01/27/2013   right   Diabetes mellitus type II    Diabetic gastroparesis (HCC)    Esophageal reflux 08/25/2013   [place], Dr Vonda Antigua   FATTY LIVER DISEASE 04/27/2007   Qualifier: Diagnosis of  By: Terrilee Croak CMA, Darlene     Focal muscle atrophy 09/26/2015   Fundic gland polyps of stomach, benign    Globus sensation 04/13/2018   Hordeolum externum of right upper eyelid 12/24/2021   HTN (hypertension)    Hx of adenomatous colonic polyps 12/10/2021    Hyperlipidemia, mixed 05/04/2007   Qualifier: Diagnosis of  By: Nena Jordan crestor caused myalgias even at low dose Livalo caused myalgias Lipitor  Mother with severe reaction myalgias Simvastatin, Welchol    Hypokalemia 05/25/2013   Improved stopping HCTZ. Was noted to have an elevated glucose when K was low. Recheck renal next week after starting Maxzide   Hypomagnesemia 08/10/2014   Hypothyroidism    Iron deficiency anemia 08/07/2018   Iron malabsorption 08/07/2018   Laryngitis 08/25/2017   Leg cramps 04/15/2016   Nausea without vomiting 08/08/2014   NONSPEC ELEVATION OF LEVELS OF TRANSAMINASE/LDH 05/01/2007   Qualifier: Diagnosis of  By: Francesca Jewett     Osteoarthritis    chronic, right knee (11/04/2017)   Plantar fasciitis    Rosacea 10/22/2016   Urine incontinence    Past Surgical History:  Procedure Laterality Date   AUGMENTATION MAMMAPLASTY Bilateral 2002   BLADDER SUSPENSION  1981   COLONOSCOPY     CORONARY PRESSURE/FFR STUDY N/A 11/04/2017   Procedure: INTRAVASCULAR PRESSURE WIRE/FFR STUDY;  Surgeon: Yvonne Kendall, MD;  Location: [facility] INVASIVE CV [facility];  Service: Cardiovascular;  Laterality: N/A;   CORONARY STENT INTERVENTION N/A 11/04/2017   Procedure: CORONARY STENT INTERVENTION;  Surgeon: Yvonne Kendall, MD;  Location: [facility] INVASIVE CV LAB;  Service: Cardiovascular;  Laterality: N/A;   CORONARY ULTRASOUND/IVUS N/A 11/04/2017   Procedure: Intravascular Ultrasound/IVUS;  Surgeon: Yvonne Kendall, MD;  Location: [facility] INVASIVE CV [facility];  Service: Cardiovascular;  Laterality: N/A;   ENDOVENOUS ABLATION SAPHENOUS VEIN W/ LASER Left 12/29/2019   endovenous laser ablation left greater saphenous vein by Fabienne Bruns MD    ESOPHAGOGASTRODUODENOSCOPY     KNEE ARTHROSCOPY  1998, [id number]   LAPAROSCOPIC CHOLECYSTECTOMY  2007   LEFT HEART CATH AND CORONARY ANGIOGRAPHY N/A 11/04/2017   Procedure: LEFT HEART CATH AND CORONARY ANGIOGRAPHY;  Surgeon: Yvonne Kendall, MD;  Location: [facility]  INVASIVE CV LAB;  Service: Cardiovascular;  Laterality: N/A;   LEFT HEART CATHETERIZATION WITH CORONARY ANGIOGRAM N/A 11/21/2011   Procedure: LEFT HEART CATHETERIZATION WITH CORONARY ANGIOGRAM;  Surgeon: Wendall Stade, MD;  Location: [facility] CATH LAB;  Service: Cardiovascular;  Laterality: N/A;   TONSILLECTOMY  1975   VAGINAL HYSTERECTOMY  1983   "still have my ovaries"   Family History  Problem Relation Age of Onset   Alzheimer's disease Mother    Diabetes type II Mother    Hiatal hernia Mother    Diabetes Mother    Emphysema Father    COPD Father    Depression Sister        suicide   Diabetes Sister    Diabetes Daughter    Colon cancer Neg Hx    Stomach cancer Neg Hx    Esophageal cancer  Neg Hx    Rectal cancer Neg Hx    Allergies as of 11/05/2022       Reactions   Colesevelam Other (See Comments)   Epinephrine Other (See Comments)   Pass out    Other    Other reaction(s): Myalgia   Statins Other (See Comments)   Myalgias: Simvastatin also caused back ache   Welchol [colesevelam Hcl]    myalgia   Amoxicillin Itching, Rash   Has patient had a PCN reaction causing immediate rash, facial/tongue/throat swelling, SOB or lightheadedness with hypotension: No Has patient had a PCN reaction causing severe rash involving mucus membranes or skin necrosis: No Has patient had a PCN reaction that required hospitalization: No Has patient had a PCN reaction occurring within the last 10 years: Yes If all of the above answers are "NO", then may proceed with Cephalosporin use.        Medication List        Accurate as of November 05, 2022  8:31 AM. If you have any questions, ask your nurse or doctor.          Accu-Chek Guide test strip Generic drug: glucose blood Use 4 times daily as needed or directed.   Accu-Chek Softclix Lancets lancets Use 4 times daily as needed or directed   CALTRATE 600 PO Take 600 mg by mouth 2 (two) times daily.   Eliquis 5 MG Tabs tablet Generic  drug: apixaban TAKE 1 TABLET TWICE A DAY   ezetimibe 10 MG tablet Commonly known as: ZETIA Take 10 mg by mouth daily.   fenofibrate 160 MG tablet Take 1 tablet (160 mg total) by mouth daily.   isosorbide mononitrate 30 MG 24 hr tablet Commonly known as: IMDUR Take 1 tablet (30 mg total) by mouth daily.   levothyroxine 88 MCG tablet Commonly known as: SYNTHROID Take 88 mcg by mouth every morning.   Magnesium 250 MG Tabs Take 1 tablet by mouth daily.   metFORMIN 500 MG tablet Commonly known as: GLUCOPHAGE Take 500 mg by mouth 2 (two) times daily with a meal.   metoprolol tartrate 25 MG tablet Commonly known as: LOPRESSOR TAKE 1 TABLET TWICE A DAY. TAKE AN EXTRA DOSE FOR BREAK THROUGH ATRIAL FIBRILLATION WITH INCREASED HEART RATE GREATER THAN 100.   nitroGLYCERIN 0.4 MG SL tablet Commonly known as: NITROSTAT Place 1 tablet (0.4 mg total) under the tongue every 5 (five) minutes as needed for chest pain.   NovoLIN 70/30 Kwikpen (70-30) 100 UNIT/ML KwikPen Generic drug: insulin isophane & regular human KwikPen 24uam/15ulunch/30usupper Subcutaneous Three times a day for 90 days   pantoprazole 40 MG tablet Commonly known as: PROTONIX Take 1 tablet (40 mg total) by mouth 2 (two) times daily before a meal.   pregabalin 50 MG capsule Commonly known as: LYRICA Take 1 capsule (50 mg total) by mouth 2 (two) times daily.   PROBIOTIC DAILY PO Take 99 mg by mouth daily.   VESALIUS evolocumab (AMG 145) or placebo 140 mg/mL SQ injection Inject 1 mL (140 mg total) into the skin every 14 (fourteen) days. Return kit at next visit.If any questions or concerns occur regarding this medication, please contact [facility]. **For Investigational Drug Use Only**   VITAMIN B 12 PO Take by mouth daily.        All past medical history, surgical history, allergies, family history, immunizations andmedications were updated in the EMR today and reviewed under the history and medication  portions of their EMR.  ROS Negative, with the exception of above mentioned in HPI   Objective:  BP 128/77   Pulse 67   Temp 97.9 F (36.6 C)   Wt 185 lb 6.4 oz (84.1 kg)   SpO2 98%   BMI 30.85 kg/m  Body mass index is 30.85 kg/m. Physical Exam Vitals and nursing note reviewed.  Constitutional:      General: She is not in acute distress.    Appearance: Normal appearance. She is not ill-appearing, toxic-appearing or diaphoretic.  HENT:     Head: Normocephalic and atraumatic.  Eyes:     General: No scleral icterus.       Right eye: No discharge.        Left eye: No discharge.     Extraocular Movements: Extraocular movements intact.     Conjunctiva/sclera: Conjunctivae normal.     Pupils: Pupils are equal, round, and reactive to light.  Cardiovascular:     Rate and Rhythm: Normal rate and regular rhythm.  Pulmonary:     Effort: Pulmonary effort is normal. No respiratory distress.     Breath sounds: Normal breath sounds. No wheezing, rhonchi or rales.  Musculoskeletal:     Right lower leg: No edema.     Left lower leg: No edema.  Skin:    General: Skin is warm.     Findings: No rash.  Neurological:     Mental Status: She is alert and oriented to person, place, and time. Mental status is at baseline.     Motor: No weakness.     Gait: Gait normal.  Psychiatric:        Mood and Affect: Mood normal.        Behavior: Behavior normal.        Thought Content: Thought content normal.        Judgment: Judgment normal.    No results found. No results found. No results found for this or any previous visit (from the past 24 hour(s)).  Assessment/Plan: [name] [name] is a 67 y.o. female present for OV for  Acquired thrombophilia (HCC)-Eliquis use/PAF (paroxysmal atrial fibrillation) (HCC) Managed by Cardio. Pt has her surgical risk assessment with them scheduled next week.  She is on eliquis, which she will need instructions on use surrounding surgery  date  Endocrine: managed by endocrinology Diabetes - controlled, last A1c 6.7. Hypothyroid- stable.  Obesity: Body mass index is 30.85 kg/m.  Cervical radiculopathy Would advise she not stop lyrica abruptly. May taper off after surgery if desired. Pt was provided with tapering instructions   To the best of my knowledge and per patients reported PMH, there is not a medical contraindication for undergoing surgery.  Patient understands the purpose of preoperative visit is to attempt to minimize surgical complications and communicate to surgical team chronic conditions and management. No patient is free of risk when undergoing a procedure. The decision about whether to proceed with the operation belongs to the surgeon and the patient.   Form completed today    Reviewed expectations re: course of current medical issues. Discussed self-management of symptoms. Outlined signs and symptoms indicating need for more acute intervention. Patient verbalized understanding and all questions were answered. Patient received an After-Visit Summary.    No orders of the defined types were placed in this encounter.  No orders of the defined types were placed in this encounter.  Referral Orders  No referral(s) requested today     Note is dictated utilizing voice recognition software. Although note has  been proof read prior to signing, occasional typographical errors still can be missed. If any questions arise, please do not hesitate to call for verification.   electronically signed by:  Howard Pouch, DO  [place]

## 2022-11-12 ENCOUNTER — Ambulatory Visit: Payer: Medicare Other

## 2022-11-12 NOTE — Telephone Encounter (Signed)
   Patient Name: [name] [name]  DOB: [birth year] MRN: [id number]  Primary Cardiologist: Rollene Rotunda, MD  Virtual visit canceled as request was for pharmacy clearance. Clinical pharmacists have reviewed the patient's past medical history, labs, and current medications as part of preoperative protocol coverage. The following recommendations have been made:  Patient with diagnosis of afib on Eliquis for anticoagulation.     Procedure: ACDF C5-7 Date of procedure: TBD   CHA2DS2-VASc Score = 5  This indicates a 7.2% annual risk of stroke. The patient's score is based upon: CHF History: 0 HTN History: 1 Diabetes History: 1 Stroke History: 0 Vascular Disease History: 1 Age Score: 1 Gender Score: 1   CrCl 49mL/min using adj body weight Platelet count 233K   Per office protocol, patient can hold Eliquis for 3 days prior to procedure.  Please resume Eliquis as soon as possible postprocedure, at the discretion of the surgeon.   I will route this recommendation to the requesting party via Epic fax function and remove from pre-op pool.  Please call with questions.  Joylene Grapes, NP 11/12/2022, 9:01 AM

## 2022-11-12 NOTE — Telephone Encounter (Signed)
I called the pt per the pre op APP today who states clearance looks to be just for holding blood thinner and we can cancel the tele appt for today.   I have s/w the pt and she has been made aware tele appt today can be cancelled per preop APP. I have reviewed with the pt that she can hold Eliquis x 3 days prior to procedure. Pt made aware Dr. Shon Baton will let her know when safe to resume Eliquis after procedure. Pt thanked me for the call and the help. I did inform her that the pre op APP will fax notes to Dr. Shon Baton.

## 2022-11-18 ENCOUNTER — Ambulatory Visit (HOSPITAL_COMMUNITY): Payer: Self-pay | Admitting: Orthopedic Surgery

## 2022-11-19 NOTE — Progress Notes (Signed)
Surgical Instructions    Your procedure is scheduled on November 25, 2022  Report to [facility] Main Entrance "A" at 11:30 A.M., then check in with the Admitting office.  Call this number if you have problems the morning of surgery:  [phone]  If you have any questions prior to your surgery date call [phone]: Open Monday-Friday 8am-4pm If you experience any cold or flu symptoms such as cough, fever, chills, shortness of breath, etc. between now and your scheduled surgery, please notify us at the above number.     Remember:  Do not eat or drink after midnight the night before your surgery     Take these medicines the morning of surgery with A SIP OF WATER:  Carboxymeth-Glyc-Polysorb PF REFRESH OPTIVE MEGA  ezetimibe (ZETIA)  fenofibrate    fexofenadine (ALLEGRA)   isosorbide mononitrate (IMDUR) levothyroxine (SYNTHROID)  metoprolol tartrate (LOPRESSOR)  pantoprazole (PROTONIX   pregabalin (LYRICA)     May take these medications IF NEEDED:  nitroGLYCERIN (NITROSTAT) - please call [phone] if you take a dose of this medication prior to surgery    Follow your surgeon's instructions on when to stop ELIQUIS.  If no instructions were given by your surgeon then you will need to call the office to get those instructions.     As of today, STOP taking any Aspirin (unless otherwise instructed by your surgeon) Aleve, Naproxen, Ibuprofen, Motrin, Advil, Goody's, BC's, all herbal medications, fish oil, and all vitamins.   WHAT DO I DO ABOUT MY DIABETES MEDICATION?   Do not take metFORMIN (GLUCOPHAGE-XR)  the morning of surgery.   THE NIGHT BEFORE SURGERY, take 17.5 units of NOVOLIN 70/30 KWIKPEN insulin.       THE MORNING OF SURGERY, DO NOT take any of your NOVOLIN 70/30 KWIKPEN.   HOW TO MANAGE YOUR DIABETES BEFORE AND AFTER SURGERY  Why is it important to control my blood sugar before and after surgery? Improving blood sugar levels before and after surgery helps  healing and can limit problems. A way of improving blood sugar control is eating a healthy diet by:  Eating less sugar and carbohydrates  Increasing activity/exercise  Talking with your doctor about reaching your blood sugar goals High blood sugars (greater than 180 mg/dL) can raise your risk of infections and slow your recovery, so you will need to focus on controlling your diabetes during the weeks before surgery. Make sure that the doctor who takes care of your diabetes knows about your planned surgery including the date and location.  How do I manage my blood sugar before surgery? Check your blood sugar at least 4 times a day, starting 2 days before surgery, to make sure that the level is not too high or low.  Check your blood sugar the morning of your surgery when you wake up and every 2 hours until you get to the Short Stay unit.  If your blood sugar is less than 70 mg/dL, you will need to treat for low blood sugar: Do not take insulin. Treat a low blood sugar (less than 70 mg/dL) with  cup of clear juice (cranberry or apple), 4 glucose tablets, OR glucose gel. Recheck blood sugar in 15 minutes after treatment (to make sure it is greater than 70 mg/dL). If your blood sugar is not greater than 70 mg/dL on recheck, call [phone] for further instructions. Report your blood sugar to the short stay nurse when you get to Short Stay.  If you are admitted to the hospital  after surgery: Your blood sugar will be checked by the staff and you will probably be given insulin after surgery (instead of oral diabetes medicines) to make sure you have good blood sugar levels. The goal for blood sugar control after surgery is 80-180 mg/dL.       Do NOT Smoke (Tobacco/Vaping) for 24 hours prior to your procedure.  If you use a CPAP at night, you may bring your mask/headgear for your overnight stay.   Contacts, glasses, piercing's, hearing aid's, dentures or partials may not be worn into surgery,  please bring cases for these belongings.    For patients admitted to the hospital, discharge time will be determined by your treatment team.   Patients discharged the day of surgery will not be allowed to drive home, and someone needs to stay with them for 24 hours.  SURGICAL WAITING ROOM VISITATION Patients having surgery or a procedure may have no more than 2 support people in the waiting area - these visitors may rotate.   Children under the age of 40 must have an adult with them who is not the patient. If the patient needs to stay at the hospital during part of their recovery, the visitor guidelines for inpatient rooms apply. Pre-op nurse will coordinate an appropriate time for 1 support person to accompany patient in pre-op.  This support person may not rotate.   Please refer to the [facility] website for the visitor guidelines for Inpatients (after your surgery is over and you are in a regular room).    If you received a COVID test during your pre-op visit  it is requested that you wear a mask when out in public, stay away from anyone that may not be feeling well and notify your surgeon if you develop symptoms. If you have been in contact with anyone that has tested positive in the last 10 days please notify you surgeon.   - Preparing For Surgery  Before surgery, you can play an important role. Because skin is not sterile, your skin needs to be as free of germs as possible. You can reduce the number of germs on your skin by washing with CHG (chlorahexidine gluconate) Soap before surgery.  CHG is an antiseptic cleaner which kills germs and bonds with the skin to continue killing germs even after washing.     Pre-operative 5 CHG Bath Instructions   You can play a key role in reducing the risk of infection after surgery. Your skin needs to be as free of germs as possible. You can reduce the number of germs on your skin by washing with CHG (chlorhexidine gluconate) soap before  surgery. CHG is an antiseptic soap that kills germs and continues to kill germs even after washing.   DO NOT use if you have an allergy to chlorhexidine/CHG or antibacterial soaps. If your skin becomes reddened or irritated, stop using the CHG and notify one of our RNs at [phone].   Please shower with the CHG soap starting 4 days before surgery using the following schedule:     Please keep in mind the following:  DO NOT shave, including legs and underarms, starting the day of your first shower.   You may shave your face at any point before/day of surgery.  Place clean sheets on your bed the day you start using CHG soap. Use a clean washcloth (not used since being washed) for each shower. DO NOT sleep with pets once you start using the CHG.   CHG  Shower Instructions:  If you choose to wash your hair and private area, wash first with your normal shampoo/soap.  After you use shampoo/soap, rinse your hair and body thoroughly to remove shampoo/soap residue.  Turn the water OFF and apply about 3 tablespoons (45 ml) of CHG soap to a CLEAN washcloth.  Apply CHG soap ONLY FROM YOUR NECK DOWN TO YOUR TOES (washing for 3-5 minutes)  DO NOT use CHG soap on face, private areas, open wounds, or sores.  Pay special attention to the area where your surgery is being performed.  If you are having back surgery, having someone wash your back for you may be helpful. Wait 2 minutes after CHG soap is applied, then you may rinse off the CHG soap.  Pat dry with a clean towel  Put on clean clothes/pajamas   If you choose to wear lotion, please use ONLY the CHG-compatible lotions on the back of this paper.     Additional instructions for the day of surgery: DO NOT APPLY any lotions, deodorants, cologne, or perfumes.  Do not wear jewelry or makeup Do not wear nail polish, gel polish, artificial nails, or any other type of covering on natural nails (fingers and toes) Do not bring valuables to the hospital.  [facility] is not responsible for any belongings or valuables. Put on clean/comfortable clothes.  Brush your teeth.  Ask your nurse before applying any prescription medications to the skin.      CHG Compatible Lotions   Aveeno Moisturizing lotion  Cetaphil Moisturizing Cream  Cetaphil Moisturizing Lotion  Clairol Herbal Essence Moisturizing Lotion, Dry Skin  Clairol Herbal Essence Moisturizing Lotion, Extra Dry Skin  Clairol Herbal Essence Moisturizing Lotion, Normal Skin  Curel Age Defying Therapeutic Moisturizing Lotion with Alpha Hydroxy  Curel Extreme Care Body Lotion  Curel Soothing Hands Moisturizing Hand Lotion  Curel Therapeutic Moisturizing Cream, Fragrance-Free  Curel Therapeutic Moisturizing Lotion, Fragrance-Free  Curel Therapeutic Moisturizing Lotion, Original Formula  Eucerin Daily Replenishing Lotion  Eucerin Dry Skin Therapy Plus Alpha Hydroxy Crme  Eucerin Dry Skin Therapy Plus Alpha Hydroxy Lotion  Eucerin Original Crme  Eucerin Original Lotion  Eucerin Plus Crme Eucerin Plus Lotion  Eucerin TriLipid Replenishing Lotion  Keri Anti-Bacterial Hand Lotion  Keri Deep Conditioning Original Lotion Dry Skin Formula Softly Scented  Keri Deep Conditioning Original Lotion, Fragrance Free Sensitive Skin Formula  Keri Lotion Fast Absorbing Fragrance Free Sensitive Skin Formula  Keri Lotion Fast Absorbing Softly Scented Dry Skin Formula  Keri Original Lotion  Keri Skin Renewal Lotion Keri Silky Smooth Lotion  Keri Silky Smooth Sensitive Skin Lotion  Nivea Body Creamy Conditioning Oil  Nivea Body Extra Enriched Lotion  Nivea Body Original Lotion  Nivea Body Sheer Moisturizing Lotion Nivea Crme  Nivea Skin Firming Lotion  NutraDerm 30 Skin Lotion  NutraDerm Skin Lotion  NutraDerm Therapeutic Skin Cream  NutraDerm Therapeutic Skin Lotion  ProShield Protective Hand Cream  Provon moisturizing lotion    Please read over the following fact sheets that you were  given.

## 2022-11-19 NOTE — Progress Notes (Signed)
Surgical Instructions    Your procedure is scheduled on July 1,2024  Report to [facility] Main Entrance "A" at 11:30 A.M., then check in with the Admitting office.  Call this number if you have problems the morning of surgery:  [phone]  If you have any questions prior to your surgery date call [phone]: Open Monday-Friday 8am-4pm If you experience any cold or flu symptoms such as cough, fever, chills, shortness of breath, etc. between now and your scheduled surgery, please notify us at the above number.     Remember:  Do not eat or drink after midnight the night before your surgery     Take these medicines the morning of surgery with A SIP OF WATER  Carboxymeth-Glyc-Polysorb PF REFRESH OPTIVE MEGA-   ezetimibe ZETIA  fenofibrate 160    fexofenadine (ALLEGRA)   isosorbide mononitrate (IMDUR) metFORMIN (GLUCOPHAGE-XR)  levothyroxine (SYNTHROID )  metoprolol tartrate (LOPRESSOR   pantoprazole (PROTONIX   pregabalin (LYRICA)    nitroGLYCERIN (NITROSTAT ) If Needed As of today, STOP taking any Aspirin (unless otherwise instructed by your surgeon) Aleve, Naproxen, Ibuprofen, Motrin, Advil, Goody's, BC's, all herbal medications, fish oil, and all vitamins.   ELIQUIS 5 MG                 WHAT DO I DO ABOUT MY DIABETES MEDICATION?   Do not take oral diabetes medicines (pills) the morning of surgery.  THE NIGHT BEFORE SURGERY, take ___________ units of ___________insulin.       THE MORNING OF SURGERY, take _____________ units of __________insulin.  The day of surgery, do not take other diabetes injectables, including Byetta (exenatide), Bydureon (exenatide ER), Victoza (liraglutide), or Trulicity (dulaglutide).  If your CBG is greater than 220 mg/dL, you may take  of your sliding scale (correction) dose of insulin.   HOW TO MANAGE YOUR DIABETES BEFORE AND AFTER SURGERY  Why is it important to control my blood sugar before and after surgery? Improving blood sugar  levels before and after surgery helps healing and can limit problems. A way of improving blood sugar control is eating a healthy diet by:  Eating less sugar and carbohydrates  Increasing activity/exercise  Talking with your doctor about reaching your blood sugar goals High blood sugars (greater than 180 mg/dL) can raise your risk of infections and slow your recovery, so you will need to focus on controlling your diabetes during the weeks before surgery. Make sure that the doctor who takes care of your diabetes knows about your planned surgery including the date and location.  How do I manage my blood sugar before surgery? Check your blood sugar at least 4 times a day, starting 2 days before surgery, to make sure that the level is not too high or low.  Check your blood sugar the morning of your surgery when you wake up and every 2 hours until you get to the Short Stay unit.  If your blood sugar is less than 70 mg/dL, you will need to treat for low blood sugar: Do not take insulin. Treat a low blood sugar (less than 70 mg/dL) with  cup of clear juice (cranberry or apple), 4 glucose tablets, OR glucose gel. Recheck blood sugar in 15 minutes after treatment (to make sure it is greater than 70 mg/dL). If your blood sugar is not greater than 70 mg/dL on recheck, call [phone] for further instructions. Report your blood sugar to the short stay nurse when you get to Short Stay.  If you are  admitted to the hospital after surgery: Your blood sugar will be checked by the staff and you will probably be given insulin after surgery (instead of oral diabetes medicines) to make sure you have good blood sugar levels. The goal for blood sugar control after surgery is 80-180 mg/dL.       Do NOT Smoke (Tobacco/Vaping) for 24 hours prior to your procedure.  If you use a CPAP at night, you may bring your mask/headgear for your overnight stay.   Contacts, glasses, piercing's, hearing aid's, dentures or  partials may not be worn into surgery, please bring cases for these belongings.    For patients admitted to the hospital, discharge time will be determined by your treatment team.   Patients discharged the day of surgery will not be allowed to drive home, and someone needs to stay with them for 24 hours.  SURGICAL WAITING ROOM VISITATION Patients having surgery or a procedure may have no more than 2 support people in the waiting area - these visitors may rotate.   Children under the age of 98 must have an adult with them who is not the patient. If the patient needs to stay at the hospital during part of their recovery, the visitor guidelines for inpatient rooms apply. Pre-op nurse will coordinate an appropriate time for 1 support person to accompany patient in pre-op.  This support person may not rotate.   Please refer to the [facility] website for the visitor guidelines for Inpatients (after your surgery is over and you are in a regular room).    Special instructions:   - Preparing For Surgery  Before surgery, you can play an important role. Because skin is not sterile, your skin needs to be as free of germs as possible. You can reduce the number of germs on your skin by washing with CHG (chlorahexidine gluconate) Soap before surgery.  CHG is an antiseptic cleaner which kills germs and bonds with the skin to continue killing germs even after washing.     Pre-operative 5 CHG Bath Instructions   You can play a key role in reducing the risk of infection after surgery. Your skin needs to be as free of germs as possible. You can reduce the number of germs on your skin by washing with CHG (chlorhexidine gluconate) soap before surgery. CHG is an antiseptic soap that kills germs and continues to kill germs even after washing.   DO NOT use if you have an allergy to chlorhexidine/CHG or antibacterial soaps. If your skin becomes reddened or irritated, stop using the CHG and notify one of  our RNs at [phone].   Please shower with the CHG soap starting 4 days before surgery using the following schedule:     Please keep in mind the following:  DO NOT shave, including legs and underarms, starting the day of your first shower.   You may shave your face at any point before/day of surgery.  Place clean sheets on your bed the day you start using CHG soap. Use a clean washcloth (not used since being washed) for each shower. DO NOT sleep with pets once you start using the CHG.   CHG Shower Instructions:  If you choose to wash your hair and private area, wash first with your normal shampoo/soap.  After you use shampoo/soap, rinse your hair and body thoroughly to remove shampoo/soap residue.  Turn the water OFF and apply about 3 tablespoons (45 ml) of CHG soap to a CLEAN washcloth.  Apply CHG  soap ONLY FROM YOUR NECK DOWN TO YOUR TOES (washing for 3-5 minutes)  DO NOT use CHG soap on face, private areas, open wounds, or sores.  Pay special attention to the area where your surgery is being performed.  If you are having back surgery, having someone wash your back for you may be helpful. Wait 2 minutes after CHG soap is applied, then you may rinse off the CHG soap.  Pat dry with a clean towel  Put on clean clothes/pajamas   If you choose to wear lotion, please use ONLY the CHG-compatible lotions on the back of this paper.     Additional instructions for the day of surgery: DO NOT APPLY any lotions, deodorants, cologne, or perfumes.  Do not wear jewelry or makeup Do not wear nail polish, gel polish, artificial nails, or any other type of covering on natural nails (fingers and toes) Put on clean/comfortable clothes.  Brush your teeth.  Ask your nurse before applying any prescription medications to the skin.      CHG Compatible Lotions   Aveeno Moisturizing lotion  Cetaphil Moisturizing Cream  Cetaphil Moisturizing Lotion  Clairol Herbal Essence Moisturizing Lotion, Dry  Skin  Clairol Herbal Essence Moisturizing Lotion, Extra Dry Skin  Clairol Herbal Essence Moisturizing Lotion, Normal Skin  Curel Age Defying Therapeutic Moisturizing Lotion with Alpha Hydroxy  Curel Extreme Care Body Lotion  Curel Soothing Hands Moisturizing Hand Lotion  Curel Therapeutic Moisturizing Cream, Fragrance-Free  Curel Therapeutic Moisturizing Lotion, Fragrance-Free  Curel Therapeutic Moisturizing Lotion, Original Formula  Eucerin Daily Replenishing Lotion  Eucerin Dry Skin Therapy Plus Alpha Hydroxy Crme  Eucerin Dry Skin Therapy Plus Alpha Hydroxy Lotion  Eucerin Original Crme  Eucerin Original Lotion  Eucerin Plus Crme Eucerin Plus Lotion  Eucerin TriLipid Replenishing Lotion  Keri Anti-Bacterial Hand Lotion  Keri Deep Conditioning Original Lotion Dry Skin Formula Softly Scented  Keri Deep Conditioning Original Lotion, Fragrance Free Sensitive Skin Formula  Keri Lotion Fast Absorbing Fragrance Free Sensitive Skin Formula  Keri Lotion Fast Absorbing Softly Scented Dry Skin Formula  Keri Original Lotion  Keri Skin Renewal Lotion Keri Silky Smooth Lotion  Keri Silky Smooth Sensitive Skin Lotion  Nivea Body Creamy Conditioning Oil  Nivea Body Extra Enriched Lotion  Nivea Body Original Lotion  Nivea Body Sheer Moisturizing Lotion Nivea Crme  Nivea Skin Firming Lotion  NutraDerm 30 Skin Lotion  NutraDerm Skin Lotion  NutraDerm Therapeutic Skin Cream  NutraDerm Therapeutic Skin Lotion  ProShield Protective Hand Cream  Provon moisturizing lotion   Oral Hygiene is also important to reduce your risk of infection.  Remember - BRUSH YOUR TEETH THE MORNING OF SURGERY WITH YOUR REGULAR TOOTHPASTE  Please do not use if you have an allergy to CHG or antibacterial soaps. If your skin becomes reddened/irritated stop using the CHG.  Do not shave (including legs and underarms) for at least 48 hours prior to first CHG shower. It is OK to shave your face.  Please follow these  instructions carefully.   Shower the NIGHT BEFORE SURGERY and the MORNING OF SURGERY  If you chose to wash your hair, wash your hair first as usual with your normal shampoo.  After you shampoo, rinse your hair and body thoroughly to remove the shampoo.  Use CHG Soap as you would any other liquid soap. You can apply CHG directly to the skin and wash gently with a scrungie or a clean washcloth.   Apply the CHG Soap to your body ONLY FROM THE NECK DOWN.  Do not use on open wounds or open sores. Avoid contact with your eyes, ears, mouth and genitals (private parts). Wash Face and genitals (private parts)  with your normal soap.   Wash thoroughly, paying special attention to the area where your surgery will be performed.  Thoroughly rinse your body with warm water from the neck down.  DO NOT shower/wash with your normal soap after using and rinsing off the CHG Soap.  Pat yourself dry with a CLEAN TOWEL.  Wear CLEAN PAJAMAS to bed the night before surgery  Place CLEAN SHEETS on your bed the night before your surgery  DO NOT SLEEP WITH PETS.   Day of Surgery: Take a shower with CHG soap. Do not shave 48 hours prior to surgery.  Men may shave face and neck. Do not bring valuables to the hospital.  [facility] is not responsible for any belongings or valuables. If you have artificial nails or gel coating that need to be removed by a nail salon, please have this removed prior to surgery. Artificial nails or gel coating may interfere with anesthesia's ability to adequately monitor your vital signs. Wear Clean/Comfortable clothing the morning of surgery Remember to brush your teeth WITH YOUR REGULAR TOOTHPASTE.   Please read over the following fact sheets that you were given.    If you received a COVID test during your pre-op visit  it is requested that you wear a mask when out in public, stay away from anyone that may not be feeling well and notify your surgeon if you develop symptoms.  If you have been in contact with anyone that has tested positive in the last 10 days please notify you surgeon.

## 2022-11-20 ENCOUNTER — Encounter (HOSPITAL_COMMUNITY): Payer: Self-pay

## 2022-11-20 ENCOUNTER — Encounter (HOSPITAL_COMMUNITY)
Admission: RE | Admit: 2022-11-20 | Discharge: 2022-11-20 | Disposition: A | Discharge status: Home / Self Care | Payer: Medicare Other | Source: Ambulatory Visit | Attending: Orthopedic Surgery | Admitting: Orthopedic Surgery

## 2022-11-20 ENCOUNTER — Other Ambulatory Visit: Payer: Self-pay

## 2022-11-20 VITALS — BP 159/78 | HR 64 | Temp 98.0°F | Resp 17 | Ht 65.5 in | Wt 187.0 lb

## 2022-11-20 DIAGNOSIS — E1143 Type 2 diabetes mellitus with diabetic autonomic (poly)neuropathy: Secondary | ICD-10-CM | POA: Diagnosis not present

## 2022-11-20 DIAGNOSIS — K760 Fatty (change of) liver, not elsewhere classified: Secondary | ICD-10-CM | POA: Diagnosis not present

## 2022-11-20 DIAGNOSIS — I480 Paroxysmal atrial fibrillation: Secondary | ICD-10-CM | POA: Diagnosis not present

## 2022-11-20 DIAGNOSIS — Z794 Long term (current) use of insulin: Secondary | ICD-10-CM | POA: Insufficient documentation

## 2022-11-20 DIAGNOSIS — Z7984 Long term (current) use of oral hypoglycemic drugs: Secondary | ICD-10-CM | POA: Diagnosis not present

## 2022-11-20 DIAGNOSIS — E119 Type 2 diabetes mellitus without complications: Secondary | ICD-10-CM

## 2022-11-20 DIAGNOSIS — Z955 Presence of coronary angioplasty implant and graft: Secondary | ICD-10-CM | POA: Diagnosis not present

## 2022-11-20 DIAGNOSIS — I2510 Atherosclerotic heart disease of native coronary artery without angina pectoris: Secondary | ICD-10-CM | POA: Diagnosis not present

## 2022-11-20 DIAGNOSIS — M4722 Other spondylosis with radiculopathy, cervical region: Secondary | ICD-10-CM | POA: Diagnosis not present

## 2022-11-20 DIAGNOSIS — I10 Essential (primary) hypertension: Secondary | ICD-10-CM | POA: Diagnosis not present

## 2022-11-20 DIAGNOSIS — Z01812 Encounter for preprocedural laboratory examination: Secondary | ICD-10-CM | POA: Insufficient documentation

## 2022-11-20 DIAGNOSIS — Z01818 Encounter for other preprocedural examination: Secondary | ICD-10-CM

## 2022-11-20 HISTORY — DX: Cardiac arrhythmia, unspecified: I49.9

## 2022-11-20 LAB — GLUCOSE, CAPILLARY: Glucose-Capillary: 151 mg/dL — ABNORMAL HIGH (ref 70–99)

## 2022-11-20 LAB — CBC
HCT: 36.4 % (ref 36.0–46.0)
Hemoglobin: 11.8 g/dL — ABNORMAL LOW (ref 12.0–15.0)
MCH: 27.8 pg (ref 26.0–34.0)
MCHC: 32.4 g/dL (ref 30.0–36.0)
MCV: 85.6 fL (ref 80.0–100.0)
Platelets: 223 10*3/uL (ref 150–400)
RBC: 4.25 MIL/uL (ref 3.87–5.11)
RDW: 13.2 % (ref 11.5–15.5)
WBC: 6.4 10*3/uL (ref 4.0–10.5)
nRBC: 0 % (ref 0.0–0.2)

## 2022-11-20 LAB — SURGICAL PCR SCREEN
MRSA, PCR: NEGATIVE
Staphylococcus aureus: NEGATIVE

## 2022-11-20 NOTE — Progress Notes (Signed)
PCP - Felix Pacini, DO Cardiologist - Dr. Rollene Rotunda (Last Office Visit 08/29/2022)  PPM/ICD - Denies Device Orders - n/a Rep Notified - n/a  Chest x-ray - Denies EKG - 08/08/2022 Stress Test - 08/27/2020 ECHO - 08/27/2020 Cardiac Cath - 11/04/2017  Sleep Study - Denies CPAP - n/a  Pt is DM2. She is wearing a Libre2 CGM on her right arm today. Normal fasting blood sugar is 150-170s. CBG at pre-op 151. Pt has had nothing to eat today and some almond milk with meds.  Last dose of GLP1 agonist-  n/a GLP1 instructions: n/a  Blood Thinner Instructions: Per surgeon instructions, pt will hold Eliquis 3 days prior to surgery. Last dose will be June 27th. Aspirin Instructions: n/a  NPO after midnight  COVID TEST- n/a   Anesthesia review: Yes. Cardiac Clearance.   Patient denies shortness of breath, fever, cough and chest pain at PAT appointment. Pt denies any respiratory illness/infection in the last two months. She does endorse runny/stuffy nose related to seasonal allergies. Pt instructed to let us know if she develops any additional symptoms such as cough, fever, sore throat etc.   All instructions explained to the patient, with a verbal understanding of the material. Patient agrees to go over the instructions while at home for a better understanding. Patient also instructed to self quarantine after being tested for COVID-19. The opportunity to ask questions was provided.

## 2022-11-21 NOTE — Progress Notes (Signed)
Anesthesia Chart Review:  Case: [id number] Date/Time: 11/25/22 0715   Procedure: ANTERIOR CERVICAL DECOMPRESSION/DISCECTOMY FUSION 2 LEVELS C5-7   Anesthesia type: General   Pre-op diagnosis: Degenerative C5-7 with neck radicular arm pain   [facility] ROOM 04 / [facility]   Surgeons: Venita Lick, MD       DISCUSSION: Patient is a 67 year old [name] scheduled for the above procedure.  History includes never smoker, HTN, HLD, CAD (DES LAD 11/04/17), PAF (08/2020), DM2 (with gastroparesis), hypothyroidism, asthma, reflux, fatty liver disease, deficiency anemia, osteoarthritis, rosacea.  Preoperative medical evaluation with Dr. Claiborne Billings on 11/05/22. She wrote, "To the best of my knowledge and per patients reported PMH, there is not a medical contraindication for undergoing surgery.  Patient understands the purpose of preoperative visit is to attempt to minimize surgical complications and communicate to surgical team chronic conditions and management. No patient is free of risk when undergoing a procedure. The decision about whether to proceed with the operation belongs to the surgeon and the patient." She provided tapering instructions for Lyrica.   Last cardiology evaluation with Dr. Antoine Poche was on 08/29/22 following 08/13/22 NM PET Cardiac perfusion scan for jaw pain. Results showed EF 59%, mild ischemia in the mid-to-apical anterolateral LV segments and was considered low-intermediate risk. Not particularly active, but no further jaw pain and no chest pain or new SOB. Continue aggressive risk reduction recommended with 12 month follow-up planned. Dr. Shon Baton requested cardiology clearance to hold Eliquis. Per protocol, [facility] Pharm[facility] advised may hold Eliquis for 3 days prior to surgery and resume as soon as possible postprocedure. - Last Eliquis dose is scheduled for 6/[name]/2024.  A1c 6.7% on 10/31/22 (see Care Everywhere). She wears a Libre2 CGM.   She had a CMP on 10/31/22 (see Care Everywhere).  Results included glucose 152, BUN 14, creatinine 1.02, sodium 142, potassium 4.4, alkaline phosphatase 48, AST 22, ALT 25. 11/20/22 CBC showed H/H 11.8/36.4, PLT 223.  Reviewed recent cardiology testing and Dr. Jenene Slicker follow-up note from April with anesthesiologist Achille Rich, MD. Continued medical therapy recommended. Medical input provided by Dr. Claiborne Billings. June labs appear acceptable for OR. Anesthesia team to evaluate on the day of surgery.    VS: BP (!) 159/78   Pulse 64   Temp 36.7 C   Resp 17   Ht 5' 5.5" (1.664 m)   Wt 84.8 kg   SpO2 98%   BMI 30.65 kg/m    PROVIDERS: Kuneff, [name], DO is PCP  Rollene Rotunda, MD is cardiologist Dorisann Frames, MD is endocrinologist   LABS: See DISCUSSION. (all labs ordered are listed, but only abnormal results are displayed)  Labs Reviewed  GLUCOSE, CAPILLARY - Abnormal; Notable for the following components:      Result Value   Glucose-Capillary 151 (*)    All other components within normal limits  CBC - Abnormal; Notable for the following components:   Hemoglobin 11.8 (*)    All other components within normal limits  SURGICAL PCR SCREEN    EKG: 08/08/2022: Sinus bradycardia at 58 bpm   CV: NM PET CT Cardiac Perfusion Study 08/13/22:   Findings are consistent with mild ischemia in the mid-to-apical anterolateral LV segments. The study is low-to-intermediate risk.   LV perfusion is abnormal. There is evidence of ischemia. Defect 1: There is a small defect with mild reduction in uptake present in the apical to mid anterolateral location(s) that is reversible. There is abnormal wall motion in the defect area. Consistent with ischemia.  Rest left ventricular function is normal. Rest EF: 59 %. Stress EF: 69 %. End diastolic cavity size is normal. End systolic cavity size is normal.   Myocardial blood flow was computed to be 0.54ml/g/min at rest and 1.70ml/g/min at stress. Global myocardial blood flow reserve was 2.25 and was  normal. Notably, the MBF in the anterolateral region is abnormal at 1.81 consistent with perfusion defect seen.   Coronary calcium assessment not performed due to prior revascularization.   Echo 08/27/20: IMPRESSIONS   1. Left ventricular ejection fraction, by estimation, is 65 to 70%. The  left ventricle has normal function. The left ventricle has no regional  wall motion abnormalities. There is mild left ventricular hypertrophy.  Left ventricular diastolic parameters  are consistent with Grade I diastolic dysfunction (impaired relaxation).   2. Right ventricular systolic function is normal. The right ventricular  size is normal.   3. The mitral valve is normal in structure. Trivial mitral valve  regurgitation. No evidence of mitral stenosis.   4. The aortic valve is normal in structure. Aortic valve regurgitation is  not visualized. No aortic stenosis is present.  - Comparison(s): A prior study was performed on 10/07/2017. No significant  change from prior study. Prior images reviewed side by side. Trivial  pericaridal effusion with prominent epicardial adipose layer is unchanged.    Long term monitor 08/28/20 - 09/11/20: Indication: PAF Minimum HR (bpm): 45 Maximum HR (bpm): 112 Supraventricular Ectopy: rare <1% SVT: none Ventricular Ectopy: rare <1% NSVT: none Ventricular Tachycardia: none Pauses: none AV block: none Atrial fibrillation: none Diary events: diary events of chest pain/pressure correlate with SR. Patient triggered monitor events correlate with SR. IMPRESSION: No atrial fibrillation on 2 week monitor.    Cardiac cath 11/04/17: Conclusions: Severe single-vessel coronary artery disease with 60% ostial/proximal LAD stenosis (FFR 0.73, IVUS MLA 4.5 mm^2). Mild to moderate, non-obstructive coronary artery disease involving ramus, LCx, and RCA. Upper normal left ventricular filling pressure. Successful FFR and IVUS-guided PCI to ostial/proximal LAD using overlapping  Xience Sierra 3.0 x 18 mm and 2.75 x 8 mm drug-eluting stents with 0% residual stenosis and TIMI-3 flow. Occlusion of small (<2 mm) D1 branch after being jailed by proximal LAD stent.  Chest pain improved with medical therapy; intervention was not attempted due to small vessel size.   Recommendations: Dual antiplatelet therapy with aspirin and ticagrelor for at least 6 months, ideally indefinitely. Aggressive secondary prevention.  Consider retrial of statin or initiation of PCSK9 inhibitor.   Past Medical History:  Diagnosis Date   Accelerating angina (HCC) 11/04/2017   Allergy    Asthma    Atrial fibrillation (HCC)    Cataract    Chronic pain disorder    neck and shoulder   Chronic pain of right upper extremity 11/10/2017   Coronary artery disease 2019   Stent x2   CTS (carpal tunnel syndrome) 01/27/2013   right   Diabetes mellitus type II    Diabetic gastroparesis (HCC)    Dysrhythmia    A. Fib   Esophageal reflux 08/25/2013   [facility] [place], Dr Vonda Antigua   FATTY LIVER DISEASE 04/27/2007   Qualifier: Diagnosis of  By: Terrilee Croak CMA, Darlene     Focal muscle atrophy 09/26/2015   Fundic gland polyps of stomach, benign    Globus sensation 04/13/2018   Hordeolum externum of right upper eyelid 12/24/2021   HTN (hypertension)    Hx of adenomatous colonic polyps 12/10/2021   Hyperlipidemia, mixed 05/04/2007   Qualifier:  Diagnosis of  By: Nena Jordan crestor caused myalgias even at low dose Livalo caused myalgias Lipitor  Mother with severe reaction myalgias Simvastatin, Welchol    Hypokalemia 05/25/2013   Improved stopping HCTZ. Was noted to have an elevated glucose when K was low. Recheck renal next week after starting Maxzide   Hypomagnesemia 08/10/2014   Hypothyroidism    Iron deficiency anemia 08/07/2018   Iron malabsorption 08/07/2018   Laryngitis 08/25/2017   Leg cramps 04/15/2016   Nausea without vomiting 08/08/2014   NONSPEC ELEVATION OF LEVELS OF  TRANSAMINASE/LDH 05/01/2007   Qualifier: Diagnosis of  By: Francesca Jewett     Osteoarthritis    chronic, right knee (11/04/2017)   Plantar fasciitis    Pneumonia    Rosacea 10/22/2016   Urine incontinence     Past Surgical History:  Procedure Laterality Date   AUGMENTATION MAMMAPLASTY Bilateral 2002   BLADDER SUSPENSION  1981   CATARACT EXTRACTION W/ INTRAOCULAR LENS IMPLANT Bilateral    COLONOSCOPY     CORONARY PRESSURE/FFR STUDY N/A 11/04/2017   Procedure: INTRAVASCULAR PRESSURE WIRE/FFR STUDY;  Surgeon: Yvonne Kendall, MD;  [facility] CV [facility];  Service: Cardiovascular;  Laterality: N/A;   CORONARY STENT INTERVENTION N/A 11/04/2017   Procedure: CORONARY STENT INTERVENTION;  Surgeon: Yvonne Kendall, MD;  Location: [facility] CV [facility];  Service: Cardiovascular;  Laterality: N/A;   CORONARY ULTRASOUND/IVUS N/A 11/04/2017   Procedure: Intravascular Ultrasound/IVUS;  Surgeon: Yvonne Kendall, MD;  [facility];  Service: Cardiovascular;  Laterality: N/A;   ENDOVENOUS ABLATION SAPHENOUS VEIN W/ LASER Left 12/29/2019   endovenous laser ablation left greater saphenous vein by Fabienne Bruns MD    ESOPHAGOGASTRODUODENOSCOPY     KNEE ARTHROSCOPY Right 1998, [id number]   LAPAROSCOPIC CHOLECYSTECTOMY  2007   LEFT HEART CATH AND CORONARY ANGIOGRAPHY N/A 11/04/2017   Procedure: LEFT HEART CATH AND CORONARY ANGIOGRAPHY;  Surgeon: Yvonne Kendall, MD;  [facility];  Service: Cardiovascular;  Laterality: N/A;   LEFT HEART CATHETERIZATION WITH CORONARY ANGIOGRAM N/A 06/[name]/2013   Procedure: LEFT HEART CATHETERIZATION WITH CORONARY ANGIOGRAM;  Surgeon: Wendall Stade, MD;  Location: [facility] CATH LAB;  Service: Cardiovascular;  Laterality: N/A;   TONSILLECTOMY  1975   VAGINAL HYSTERECTOMY  1983   "still have my ovaries"    MEDICATIONS:  Accu-Chek Softclix Lancets lancets   Calcium Carbonate (CALTRATE 600 PO)   Carboxymeth-Glyc-Polysorb PF (REFRESH OPTIVE  MEGA-3) 0.5-1-0.5 % SOLN   Cyanocobalamin (VITAMIN B 12) 500 MCG TABS   ELIQUIS 5 MG TABS tablet   ezetimibe (ZETIA) 10 MG tablet   fenofibrate 160 MG tablet   fexofenadine (ALLEGRA) 180 MG tablet   glucose blood (ACCU-CHEK GUIDE) test strip   isosorbide mononitrate (IMDUR) 30 MG 24 hr tablet   ketoconazole (NIZORAL) 2 % shampoo   levothyroxine (SYNTHROID) 88 MCG tablet   Magnesium 250 MG TABS   metFORMIN (GLUCOPHAGE-XR) 500 MG 24 hr tablet   metoprolol tartrate (LOPRESSOR) 25 MG tablet   nitroGLYCERIN (NITROSTAT) 0.4 MG SL tablet   NOVOLIN 70/30 KWIKPEN (70-30) 100 UNIT/ML KwikPen   pantoprazole (PROTONIX) 40 MG tablet   pregabalin (LYRICA) 50 MG capsule   Probiotic Product (PROBIOTIC DAILY PO)   Study - VESALIUS - evolocumab (AMG 145) 140 mg/mL or placebo SQ injection (PI-Hilty)   No current facility-administered medications for this encounter.    Shonna Chock, PA-C Surgical Short Stay/Anesthesiology [facility] Phone [phone] [facility] Phone [phone] 6/[name]/2024 6:28 PM

## 2022-11-21 NOTE — Anesthesia Preprocedure Evaluation (Addendum)
Anesthesia Evaluation  Patient identified by MRN, date of birth, ID band Patient awake    Reviewed: Allergy & Precautions, NPO status , Patient's Chart, lab work & pertinent test results, reviewed documented beta blocker date and time   Airway Mallampati: II  TM Distance: >3 FB Neck ROM: Limited    Dental  (+) Teeth Intact, Dental Advisory Given, Caps   Pulmonary asthma , pneumonia   Pulmonary exam normal breath sounds clear to auscultation       Cardiovascular hypertension, Pt. on medications and Pt. on home beta blockers + angina  + CAD and + Peripheral Vascular Disease  Normal cardiovascular exam+ dysrhythmias Atrial Fibrillation  Rhythm:Regular Rate:Normal  DES Proximal LAD 11/04/2017  A. Fib 08/2020   Neuro/Psych  Neuromuscular disease  negative psych ROS   GI/Hepatic Neg liver ROS,GERD  Medicated,,Hepatic steatosis Gastroparesis   Endo/Other  diabetes, Poorly Controlled, Type 2Hypothyroidism  HLD Obesity  Renal/GU Renal InsufficiencyRenal disease  negative genitourinary   Musculoskeletal  (+) Arthritis , Osteoarthritis,  DDD C5-7   Abdominal  (+) + obese  Peds  Hematology  (+) Blood dyscrasia, anemia Eliquis therapy- last dose 6/27   Anesthesia Other Findings   Reproductive/Obstetrics                              Anesthesia Physical Anesthesia Plan  ASA: 3  Anesthesia Plan: General   Post-op Pain Management: Dilaudid IV, Precedex and Ketamine IV*   Induction: Intravenous, Rapid sequence and Cricoid pressure planned  PONV Risk Score and Plan: 4 or greater and Treatment may vary due to age or medical condition and Ondansetron  Airway Management Planned: Oral ETT and Video Laryngoscope Planned  Additional Equipment: None  Intra-op Plan:   Post-operative Plan: Extubation in OR  Informed Consent: I have reviewed the patients History and Physical, chart, labs and discussed  the procedure including the risks, benefits and alternatives for the proposed anesthesia with the patient or authorized representative who has indicated his/her understanding and acceptance.     Dental advisory given  Plan Discussed with: Anesthesiologist and CRNA  Anesthesia Plan Comments: (PAT note written 11/21/2022 by Shonna Chock, PA-C.  )        Anesthesia Quick Evaluation

## 2022-11-22 NOTE — Progress Notes (Signed)
Left voicemail with new arrival time of 0530 Monday 11/25/2022

## 2022-11-25 ENCOUNTER — Ambulatory Visit (HOSPITAL_COMMUNITY): Payer: Medicare Other

## 2022-11-25 ENCOUNTER — Ambulatory Visit (HOSPITAL_BASED_OUTPATIENT_CLINIC_OR_DEPARTMENT_OTHER): Payer: Medicare Other

## 2022-11-25 ENCOUNTER — Encounter (HOSPITAL_COMMUNITY)
Admission: RE | Disposition: A | Discharge status: Home / Self Care | Payer: Self-pay | Source: Home / Self Care | Attending: Orthopedic Surgery

## 2022-11-25 ENCOUNTER — Observation Stay (HOSPITAL_COMMUNITY)
Admission: RE | Admit: 2022-11-25 | Discharge: 2022-11-26 | Disposition: A | Discharge status: Home / Self Care | Payer: Medicare Other | Attending: Orthopedic Surgery | Admitting: Orthopedic Surgery

## 2022-11-25 ENCOUNTER — Other Ambulatory Visit: Payer: Self-pay

## 2022-11-25 ENCOUNTER — Encounter (HOSPITAL_COMMUNITY): Payer: Self-pay | Admitting: Orthopedic Surgery

## 2022-11-25 ENCOUNTER — Ambulatory Visit (HOSPITAL_COMMUNITY): Payer: Medicare Other | Admitting: Vascular Surgery

## 2022-11-25 DIAGNOSIS — Z7901 Long term (current) use of anticoagulants: Secondary | ICD-10-CM | POA: Insufficient documentation

## 2022-11-25 DIAGNOSIS — I10 Essential (primary) hypertension: Secondary | ICD-10-CM | POA: Diagnosis not present

## 2022-11-25 DIAGNOSIS — M4722 Other spondylosis with radiculopathy, cervical region: Principal | ICD-10-CM | POA: Insufficient documentation

## 2022-11-25 DIAGNOSIS — J45909 Unspecified asthma, uncomplicated: Secondary | ICD-10-CM | POA: Diagnosis not present

## 2022-11-25 DIAGNOSIS — I480 Paroxysmal atrial fibrillation: Secondary | ICD-10-CM | POA: Insufficient documentation

## 2022-11-25 DIAGNOSIS — I2510 Atherosclerotic heart disease of native coronary artery without angina pectoris: Secondary | ICD-10-CM | POA: Diagnosis not present

## 2022-11-25 DIAGNOSIS — E039 Hypothyroidism, unspecified: Secondary | ICD-10-CM | POA: Diagnosis not present

## 2022-11-25 DIAGNOSIS — Z79899 Other long term (current) drug therapy: Secondary | ICD-10-CM | POA: Insufficient documentation

## 2022-11-25 DIAGNOSIS — Z981 Arthrodesis status: Secondary | ICD-10-CM

## 2022-11-25 DIAGNOSIS — I25110 Atherosclerotic heart disease of native coronary artery with unstable angina pectoris: Secondary | ICD-10-CM | POA: Diagnosis not present

## 2022-11-25 DIAGNOSIS — Z7984 Long term (current) use of oral hypoglycemic drugs: Secondary | ICD-10-CM | POA: Insufficient documentation

## 2022-11-25 DIAGNOSIS — E119 Type 2 diabetes mellitus without complications: Secondary | ICD-10-CM | POA: Diagnosis not present

## 2022-11-25 DIAGNOSIS — E785 Hyperlipidemia, unspecified: Secondary | ICD-10-CM | POA: Diagnosis present

## 2022-11-25 HISTORY — PX: ANTERIOR CERVICAL DECOMP/DISCECTOMY FUSION: SHX1161

## 2022-11-25 LAB — GLUCOSE, CAPILLARY
Glucose-Capillary: 178 mg/dL — ABNORMAL HIGH (ref 70–99)
Glucose-Capillary: 150 mg/dL — ABNORMAL HIGH (ref 70–99)
Glucose-Capillary: 210 mg/dL — ABNORMAL HIGH (ref 70–99)
Glucose-Capillary: 201 mg/dL — ABNORMAL HIGH (ref 70–99)
Glucose-Capillary: 179 mg/dL — ABNORMAL HIGH (ref 70–99)

## 2022-11-25 LAB — BASIC METABOLIC PANEL
Anion gap: 11 (ref 5–15)
BUN: 11 mg/dL (ref 8–23)
CO2: 27 mmol/L (ref 22–32)
Calcium: 9.2 mg/dL (ref 8.9–10.3)
Chloride: 103 mmol/L (ref 98–111)
Creatinine, Ser: 0.99 mg/dL (ref 0.44–1.00)
GFR, Estimated: >60 mL/min (ref >60)
Glucose, Bld: 177 mg/dL — ABNORMAL HIGH (ref 70–99)
Potassium: 4.1 mmol/L (ref 3.5–5.1)
Sodium: 141 mmol/L (ref 135–145)

## 2022-11-25 LAB — CBC
HCT: 34.9 % — ABNORMAL LOW (ref 36.0–46.0)
Hemoglobin: 11.3 g/dL — ABNORMAL LOW (ref 12.0–15.0)
MCH: 27.2 pg (ref 26.0–34.0)
MCHC: 32.4 g/dL (ref 30.0–36.0)
MCV: 83.9 fL (ref 80.0–100.0)
Platelets: 232 10*3/uL (ref 150–400)
RBC: 4.16 MIL/uL (ref 3.87–5.11)
RDW: 13.2 % (ref 11.5–15.5)
WBC: 9.6 10*3/uL (ref 4.0–10.5)
nRBC: 0 % (ref 0.0–0.2)

## 2022-11-25 LAB — TROPONIN I (HIGH SENSITIVITY): Troponin I (High Sensitivity): 4 ng/L (ref <18)

## 2022-11-25 LAB — MAGNESIUM: Magnesium: 1.2 mg/dL — ABNORMAL LOW (ref 1.7–2.4)

## 2022-11-25 SURGERY — ANTERIOR CERVICAL DECOMPRESSION/DISCECTOMY FUSION 2 LEVELS
Anesthesia: General | Site: Spine Cervical

## 2022-11-25 MED ORDER — SODIUM CHLORIDE 0.9 % IV SOLN: 250.0000 mL | INTRAVENOUS | Status: DC

## 2022-11-25 MED ORDER — KETAMINE HCL 10 MG/ML IJ SOLN
INTRAMUSCULAR | Status: DC | PRN
  Administered 2022-11-25 (×2): 5 mg via INTRAVENOUS
  Administered 2022-11-25: 20 mg via INTRAVENOUS

## 2022-11-25 MED ORDER — ONDANSETRON HCL 4 MG/2ML IJ SOLN
INTRAMUSCULAR | Status: DC | PRN
  Administered 2022-11-25: 4 mg via INTRAVENOUS

## 2022-11-25 MED ORDER — CEFAZOLIN SODIUM-DEXTROSE 1-4 GM/50ML-% IV SOLN
1.0000 g | Freq: Three times a day (TID) | INTRAVENOUS | Status: AC
  Administered 2022-11-25 (×2): 1 g via INTRAVENOUS
  Filled 2022-11-25 (×2): qty 50

## 2022-11-25 MED ORDER — HYDROMORPHONE HCL 1 MG/ML IJ SOLN: 0.2500 mg | INTRAMUSCULAR | Status: DC | PRN

## 2022-11-25 MED ORDER — CHLORHEXIDINE GLUCONATE 0.12 % MT SOLN: 15.0000 mL | Freq: Once | OROMUCOSAL | Status: AC

## 2022-11-25 MED ORDER — MENTHOL 3 MG MT LOZG: 1.0000 | LOZENGE | OROMUCOSAL | Status: DC | PRN

## 2022-11-25 MED ORDER — ACETAMINOPHEN 650 MG RE SUPP: 650.0000 mg | RECTAL | Status: DC | PRN

## 2022-11-25 MED ORDER — METHOCARBAMOL 500 MG PO TABS: 500.0000 mg | ORAL_TABLET | Freq: Three times a day (TID) | ORAL | 0 refills | Status: AC | PRN

## 2022-11-25 MED ORDER — INSULIN ASPART PROT & ASPART (70-30 MIX) 100 UNIT/ML ~~LOC~~ SUSP
25.0000 [IU] | Freq: Every day | SUBCUTANEOUS | Status: DC
  Administered 2022-11-25: 25 [IU] via SUBCUTANEOUS
  Filled 2022-11-25: qty 10

## 2022-11-25 MED ORDER — METFORMIN HCL ER 500 MG PO TB24
500.0000 mg | ORAL_TABLET | Freq: Two times a day (BID) | ORAL | Status: DC
  Administered 2022-11-25 – 2022-11-26 (×2): 500 mg via ORAL
  Filled 2022-11-25 (×3): qty 1

## 2022-11-25 MED ORDER — LACTATED RINGERS IV SOLN: INTRAVENOUS | Status: DC

## 2022-11-25 MED ORDER — METOPROLOL TARTRATE 5 MG/5ML IV SOLN
5.0000 mg | INTRAVENOUS | Status: DC | PRN
  Administered 2022-11-25: 5 mg via INTRAVENOUS
  Filled 2022-11-25 (×2): qty 5

## 2022-11-25 MED ORDER — INSULIN ASPART 100 UNIT/ML IJ SOLN
INTRAMUSCULAR | Status: AC
  Filled 2022-11-25: qty 1

## 2022-11-25 MED ORDER — BUPIVACAINE-EPINEPHRINE 0.25% -1:200000 IJ SOLN
INTRAMUSCULAR | Status: DC | PRN
  Administered 2022-11-25: 4.5 mL

## 2022-11-25 MED ORDER — PHENYLEPHRINE HCL-NACL 20-0.9 MG/250ML-% IV SOLN
INTRAVENOUS | Status: DC | PRN
  Administered 2022-11-25: 30 ug/min via INTRAVENOUS

## 2022-11-25 MED ORDER — ROCURONIUM BROMIDE 10 MG/ML (PF) SYRINGE
PREFILLED_SYRINGE | INTRAVENOUS | Status: AC
  Filled 2022-11-25: qty 10

## 2022-11-25 MED ORDER — ONDANSETRON HCL 4 MG/2ML IJ SOLN: 4.0000 mg | Freq: Four times a day (QID) | INTRAMUSCULAR | Status: DC | PRN

## 2022-11-25 MED ORDER — CEFAZOLIN SODIUM-DEXTROSE 2-4 GM/100ML-% IV SOLN
INTRAVENOUS | Status: AC
  Filled 2022-11-25: qty 100

## 2022-11-25 MED ORDER — INSULIN ISOPHANE & REGULAR (HUMAN 70-30)100 UNIT/ML KWIKPEN: 14.0000 [IU] | PEN_INJECTOR | SUBCUTANEOUS | Status: DC

## 2022-11-25 MED ORDER — TRANEXAMIC ACID-NACL 1000-0.7 MG/100ML-% IV SOLN
1000.0000 mg | INTRAVENOUS | Status: AC
  Administered 2022-11-25: 1000 mg via INTRAVENOUS
  Filled 2022-11-25: qty 100

## 2022-11-25 MED ORDER — NITROGLYCERIN 0.4 MG SL SUBL
0.4000 mg | SUBLINGUAL_TABLET | SUBLINGUAL | Status: DC | PRN
  Administered 2022-11-25 (×2): 0.4 mg via SUBLINGUAL
  Filled 2022-11-25: qty 1

## 2022-11-25 MED ORDER — SURGIFLO WITH THROMBIN (HEMOSTATIC MATRIX KIT) OPTIME
TOPICAL | Status: DC | PRN
  Administered 2022-11-25: 1 via TOPICAL

## 2022-11-25 MED ORDER — HYDROMORPHONE HCL 1 MG/ML IJ SOLN: 0.5000 mg | INTRAMUSCULAR | Status: AC | PRN

## 2022-11-25 MED ORDER — DEXMEDETOMIDINE HCL IN NACL 80 MCG/20ML IV SOLN
INTRAVENOUS | Status: AC
  Filled 2022-11-25: qty 20

## 2022-11-25 MED ORDER — SODIUM CHLORIDE 0.9% FLUSH
3.0000 mL | Freq: Two times a day (BID) | INTRAVENOUS | Status: DC
  Administered 2022-11-26: 3 mL via INTRAVENOUS

## 2022-11-25 MED ORDER — SODIUM CHLORIDE 0.9% FLUSH: 3.0000 mL | INTRAVENOUS | Status: DC | PRN

## 2022-11-25 MED ORDER — CEFAZOLIN SODIUM-DEXTROSE 2-4 GM/100ML-% IV SOLN
2.0000 g | INTRAVENOUS | Status: AC
  Administered 2022-11-25: 2 g via INTRAVENOUS

## 2022-11-25 MED ORDER — SUCCINYLCHOLINE CHLORIDE 200 MG/10ML IV SOSY
PREFILLED_SYRINGE | INTRAVENOUS | Status: AC
  Filled 2022-11-25: qty 10

## 2022-11-25 MED ORDER — EZETIMIBE 10 MG PO TABS
10.0000 mg | ORAL_TABLET | Freq: Every day | ORAL | Status: DC
  Administered 2022-11-26: 10 mg via ORAL
  Filled 2022-11-25: qty 1

## 2022-11-25 MED ORDER — THROMBIN 20000 UNITS EX SOLR
CUTANEOUS | Status: AC
  Filled 2022-11-25: qty 20000

## 2022-11-25 MED ORDER — INSULIN ASPART 100 UNIT/ML IJ SOLN
0.0000 [IU] | Freq: Three times a day (TID) | INTRAMUSCULAR | Status: DC
  Administered 2022-11-25: 5 [IU] via SUBCUTANEOUS
  Administered 2022-11-26: 3 [IU] via SUBCUTANEOUS

## 2022-11-25 MED ORDER — PANTOPRAZOLE SODIUM 40 MG PO TBEC
40.0000 mg | DELAYED_RELEASE_TABLET | Freq: Two times a day (BID) | ORAL | Status: DC
  Administered 2022-11-25 – 2022-11-26 (×2): 40 mg via ORAL
  Filled 2022-11-25 (×2): qty 1

## 2022-11-25 MED ORDER — MIDAZOLAM HCL 2 MG/2ML IJ SOLN
INTRAMUSCULAR | Status: AC
  Filled 2022-11-25: qty 2

## 2022-11-25 MED ORDER — THROMBIN 20000 UNITS EX SOLR
CUTANEOUS | Status: DC | PRN
  Administered 2022-11-25: 20 mL via TOPICAL

## 2022-11-25 MED ORDER — KETAMINE HCL 50 MG/5ML IJ SOSY
PREFILLED_SYRINGE | INTRAMUSCULAR | Status: AC
  Filled 2022-11-25: qty 5

## 2022-11-25 MED ORDER — ISOSORBIDE MONONITRATE ER 30 MG PO TB24
30.0000 mg | ORAL_TABLET | Freq: Every day | ORAL | Status: DC
  Administered 2022-11-26: 30 mg via ORAL
  Filled 2022-11-25: qty 1

## 2022-11-25 MED ORDER — PROPOFOL 10 MG/ML IV BOLUS
INTRAVENOUS | Status: DC | PRN
  Administered 2022-11-25: 160 mg via INTRAVENOUS

## 2022-11-25 MED ORDER — ONDANSETRON HCL 4 MG/2ML IJ SOLN: 4.0000 mg | Freq: Once | INTRAMUSCULAR | Status: DC | PRN

## 2022-11-25 MED ORDER — INSULIN ASPART 100 UNIT/ML IJ SOLN: 0.0000 [IU] | Freq: Every day | INTRAMUSCULAR | Status: DC

## 2022-11-25 MED ORDER — DEXMEDETOMIDINE HCL IN NACL 80 MCG/20ML IV SOLN
INTRAVENOUS | Status: DC | PRN
  Administered 2022-11-25: 8 ug via INTRAVENOUS

## 2022-11-25 MED ORDER — HYDROMORPHONE HCL 1 MG/ML IJ SOLN
INTRAMUSCULAR | Status: AC
  Filled 2022-11-25: qty 0.5

## 2022-11-25 MED ORDER — 0.9 % SODIUM CHLORIDE (POUR BTL) OPTIME
TOPICAL | Status: DC | PRN
  Administered 2022-11-25 (×2): 1000 mL

## 2022-11-25 MED ORDER — PHENYLEPHRINE 80 MCG/ML (10ML) SYRINGE FOR IV PUSH (FOR BLOOD PRESSURE SUPPORT)
PREFILLED_SYRINGE | INTRAVENOUS | Status: AC
  Filled 2022-11-25: qty 10

## 2022-11-25 MED ORDER — INSULIN ASPART PROT & ASPART (70-30 MIX) 100 UNIT/ML ~~LOC~~ SUSP
14.0000 [IU] | Freq: Every day | SUBCUTANEOUS | Status: DC
  Administered 2022-11-26: 14 [IU] via SUBCUTANEOUS

## 2022-11-25 MED ORDER — BUPIVACAINE-EPINEPHRINE (PF) 0.25% -1:200000 IJ SOLN
INTRAMUSCULAR | Status: AC
  Filled 2022-11-25: qty 30

## 2022-11-25 MED ORDER — MIDAZOLAM HCL 2 MG/2ML IJ SOLN
INTRAMUSCULAR | Status: DC | PRN
  Administered 2022-11-25: 1 mg via INTRAVENOUS

## 2022-11-25 MED ORDER — LIDOCAINE 2% (20 MG/ML) 5 ML SYRINGE
INTRAMUSCULAR | Status: DC | PRN
  Administered 2022-11-25: 80 mg via INTRAVENOUS

## 2022-11-25 MED ORDER — INSULIN ASPART 100 UNIT/ML IJ SOLN
0.0000 [IU] | INTRAMUSCULAR | Status: DC | PRN
  Administered 2022-11-25: 2 [IU] via SUBCUTANEOUS

## 2022-11-25 MED ORDER — PHENOL 1.4 % MT LIQD: 1.0000 | OROMUCOSAL | Status: DC | PRN

## 2022-11-25 MED ORDER — SUGAMMADEX SODIUM 200 MG/2ML IV SOLN
INTRAVENOUS | Status: DC | PRN
  Administered 2022-11-25 (×2): 200 mg via INTRAVENOUS

## 2022-11-25 MED ORDER — PREGABALIN 50 MG PO CAPS
50.0000 mg | ORAL_CAPSULE | Freq: Two times a day (BID) | ORAL | Status: DC
  Administered 2022-11-25 – 2022-11-26 (×2): 50 mg via ORAL
  Filled 2022-11-25: qty 2
  Filled 2022-11-25: qty 1

## 2022-11-25 MED ORDER — CHLORHEXIDINE GLUCONATE 0.12 % MT SOLN
OROMUCOSAL | Status: AC
  Administered 2022-11-25: 15 mL via OROMUCOSAL
  Filled 2022-11-25: qty 15

## 2022-11-25 MED ORDER — FENTANYL CITRATE (PF) 250 MCG/5ML IJ SOLN
INTRAMUSCULAR | Status: DC | PRN
  Administered 2022-11-25: 100 ug via INTRAVENOUS
  Administered 2022-11-25 (×3): 50 ug via INTRAVENOUS

## 2022-11-25 MED ORDER — OXYCODONE HCL 5 MG/5ML PO SOLN: 5.0000 mg | Freq: Once | ORAL | Status: DC | PRN

## 2022-11-25 MED ORDER — ONDANSETRON HCL 4 MG PO TABS: 4.0000 mg | ORAL_TABLET | Freq: Three times a day (TID) | ORAL | 0 refills | Status: DC | PRN

## 2022-11-25 MED ORDER — NITROGLYCERIN 0.4 MG SL SUBL: 0.4000 mg | SUBLINGUAL_TABLET | SUBLINGUAL | Status: DC | PRN

## 2022-11-25 MED ORDER — ROCURONIUM BROMIDE 10 MG/ML (PF) SYRINGE
PREFILLED_SYRINGE | INTRAVENOUS | Status: DC | PRN
  Administered 2022-11-25: 20 mg via INTRAVENOUS
  Administered 2022-11-25: 50 mg via INTRAVENOUS
  Administered 2022-11-25: 20 mg via INTRAVENOUS

## 2022-11-25 MED ORDER — SUCCINYLCHOLINE CHLORIDE 200 MG/10ML IV SOSY
PREFILLED_SYRINGE | INTRAVENOUS | Status: DC | PRN
  Administered 2022-11-25: 100 mg via INTRAVENOUS

## 2022-11-25 MED ORDER — ESMOLOL HCL 100 MG/10ML IV SOLN
INTRAVENOUS | Status: DC | PRN
  Administered 2022-11-25: 40 mg via INTRAVENOUS

## 2022-11-25 MED ORDER — POLYETHYLENE GLYCOL 3350 17 G PO PACK: 17.0000 g | PACK | Freq: Every day | ORAL | Status: DC | PRN

## 2022-11-25 MED ORDER — METHOCARBAMOL 500 MG PO TABS
500.0000 mg | ORAL_TABLET | Freq: Four times a day (QID) | ORAL | Status: DC | PRN
  Administered 2022-11-26: 500 mg via ORAL
  Filled 2022-11-25: qty 1

## 2022-11-25 MED ORDER — FLEET ENEMA 7-19 GM/118ML RE ENEM: 1.0000 | ENEMA | Freq: Once | RECTAL | Status: DC | PRN

## 2022-11-25 MED ORDER — EPHEDRINE 5 MG/ML INJ
INTRAVENOUS | Status: AC
  Filled 2022-11-25: qty 5

## 2022-11-25 MED ORDER — METHOCARBAMOL 1000 MG/10ML IJ SOLN: 500.0000 mg | Freq: Four times a day (QID) | INTRAVENOUS | Status: DC | PRN

## 2022-11-25 MED ORDER — PROPOFOL 10 MG/ML IV BOLUS
INTRAVENOUS | Status: AC
  Filled 2022-11-25: qty 20

## 2022-11-25 MED ORDER — OXYCODONE HCL 5 MG PO TABS: 5.0000 mg | ORAL_TABLET | ORAL | Status: DC | PRN

## 2022-11-25 MED ORDER — ONDANSETRON HCL 4 MG PO TABS: 4.0000 mg | ORAL_TABLET | Freq: Four times a day (QID) | ORAL | Status: DC | PRN

## 2022-11-25 MED ORDER — OXYCODONE HCL 5 MG PO TABS: 5.0000 mg | ORAL_TABLET | Freq: Once | ORAL | Status: DC | PRN

## 2022-11-25 MED ORDER — LIDOCAINE 2% (20 MG/ML) 5 ML SYRINGE
INTRAMUSCULAR | Status: AC
  Filled 2022-11-25: qty 5

## 2022-11-25 MED ORDER — INSULIN ASPART PROT & ASPART (70-30 MIX) 100 UNIT/ML ~~LOC~~ SUSP: 24.0000 [IU] | Freq: Every day | SUBCUTANEOUS | Status: DC

## 2022-11-25 MED ORDER — FENTANYL CITRATE (PF) 250 MCG/5ML IJ SOLN
INTRAMUSCULAR | Status: AC
  Filled 2022-11-25: qty 5

## 2022-11-25 MED ORDER — DEXAMETHASONE SODIUM PHOSPHATE 10 MG/ML IJ SOLN
INTRAMUSCULAR | Status: DC | PRN
  Administered 2022-11-25: 4 mg via INTRAVENOUS

## 2022-11-25 MED ORDER — OXYCODONE HCL 5 MG PO TABS
10.0000 mg | ORAL_TABLET | ORAL | Status: DC | PRN
  Administered 2022-11-26: 10 mg via ORAL
  Filled 2022-11-25: qty 2

## 2022-11-25 MED ORDER — ORAL CARE MOUTH RINSE: 15.0000 mL | Freq: Once | OROMUCOSAL | Status: AC

## 2022-11-25 MED ORDER — ACETAMINOPHEN 325 MG PO TABS: 650.0000 mg | ORAL_TABLET | ORAL | Status: DC | PRN

## 2022-11-25 MED ORDER — DEXAMETHASONE SODIUM PHOSPHATE 10 MG/ML IJ SOLN
INTRAMUSCULAR | Status: AC
  Filled 2022-11-25: qty 1

## 2022-11-25 MED ORDER — OXYCODONE-ACETAMINOPHEN 10-325 MG PO TABS: 1.0000 | ORAL_TABLET | Freq: Four times a day (QID) | ORAL | 0 refills | Status: AC | PRN

## 2022-11-25 MED ORDER — FENOFIBRATE 160 MG PO TABS
160.0000 mg | ORAL_TABLET | Freq: Every day | ORAL | Status: DC
  Administered 2022-11-26: 160 mg via ORAL
  Filled 2022-11-25: qty 1

## 2022-11-25 MED ORDER — ONDANSETRON HCL 4 MG/2ML IJ SOLN
INTRAMUSCULAR | Status: AC
  Filled 2022-11-25: qty 2

## 2022-11-25 MED ORDER — LEVOTHYROXINE SODIUM 88 MCG PO TABS
88.0000 ug | ORAL_TABLET | Freq: Every morning | ORAL | Status: DC
  Administered 2022-11-26: 88 ug via ORAL
  Filled 2022-11-25: qty 1

## 2022-11-25 MED ORDER — METOPROLOL TARTRATE 25 MG PO TABS
25.0000 mg | ORAL_TABLET | Freq: Two times a day (BID) | ORAL | Status: DC
  Administered 2022-11-25 – 2022-11-26 (×2): 25 mg via ORAL
  Filled 2022-11-25 (×2): qty 1

## 2022-11-25 SURGICAL SUPPLY — 65 items
AGENT HMST KT MTR STRL THRMB (HEMOSTASIS) ×1
BAG COUNTER SPONGE SURGICOUNT (BAG) ×1 IMPLANT
BAG SPNG CNTER NS LX DISP (BAG) ×1
BLADE CLIPPER SURG (BLADE) IMPLANT
BUR EGG ELITE 4.0 (BURR) IMPLANT
BUR MATCHSTICK NEURO 3.0 LAGG (BURR) IMPLANT
CABLE BIPOLOR RESECTION CORD (MISCELLANEOUS) ×1 IMPLANT
CANISTER SUCT 3000ML PPV (MISCELLANEOUS) ×1 IMPLANT
CLSR STERI-STRIP ANTIMIC 1/2X4 (GAUZE/BANDAGES/DRESSINGS) ×1 IMPLANT
COVER MAYO STAND STRL (DRAPES) ×3 IMPLANT
COVER SURGICAL LIGHT HANDLE (MISCELLANEOUS) ×2 IMPLANT
DRAIN CHANNEL 15F RND FF W/TCR (WOUND CARE) IMPLANT
DRAPE C-ARM 42X72 X-RAY (DRAPES) ×1 IMPLANT
DRAPE POUCH INSTRU U-SHP 10X18 (DRAPES) ×1 IMPLANT
DRAPE SURG 17X23 STRL (DRAPES) ×1 IMPLANT
DRAPE U-SHAPE 47X51 STRL (DRAPES) ×1 IMPLANT
DRSG OPSITE POSTOP 3X4 (GAUZE/BANDAGES/DRESSINGS) ×1 IMPLANT
DRSG OPSITE POSTOP 4X6 (GAUZE/BANDAGES/DRESSINGS) IMPLANT
DURAPREP 26ML APPLICATOR (WOUND CARE) ×1 IMPLANT
ELECT COATED BLADE 2.86 ST (ELECTRODE) ×1 IMPLANT
ELECT PENCIL ROCKER SW 15FT (MISCELLANEOUS) ×1 IMPLANT
ELECT REM PT RETURN 9FT ADLT (ELECTROSURGICAL) ×1
ELECTRODE REM PT RTRN 9FT ADLT (ELECTROSURGICAL) ×1 IMPLANT
GLOVE BIO SURGEON STRL SZ 6.5 (GLOVE) ×1 IMPLANT
GLOVE BIOGEL PI IND STRL 6.5 (GLOVE) ×1 IMPLANT
GLOVE BIOGEL PI IND STRL 8.5 (GLOVE) ×1 IMPLANT
GLOVE SS BIOGEL STRL SZ 8.5 (GLOVE) ×1 IMPLANT
GOWN STRL REUS W/ TWL LRG LVL3 (GOWN DISPOSABLE) ×1 IMPLANT
GOWN STRL REUS W/TWL 2XL LVL3 (GOWN DISPOSABLE) ×1 IMPLANT
GOWN STRL REUS W/TWL LRG LVL3 (GOWN DISPOSABLE) ×1
KIT BASIN OR (CUSTOM PROCEDURE TRAY) ×1 IMPLANT
KIT TURNOVER KIT B (KITS) ×1 IMPLANT
NDL HYPO 22X1.5 SAFETY MO (MISCELLANEOUS) ×1 IMPLANT
NDL SPNL 18GX3.5 QUINCKE PK (NEEDLE) ×1 IMPLANT
NEEDLE HYPO 22X1.5 SAFETY MO (MISCELLANEOUS) ×1 IMPLANT
NEEDLE SPNL 18GX3.5 QUINCKE PK (NEEDLE) ×1 IMPLANT
NS IRRIG 1000ML POUR BTL (IV SOLUTION) ×1 IMPLANT
PACK ORTHO CERVICAL (CUSTOM PROCEDURE TRAY) ×1 IMPLANT
PACK UNIVERSAL I (CUSTOM PROCEDURE TRAY) ×1 IMPLANT
PAD ARMBOARD 7.5X6 YLW CONV (MISCELLANEOUS) ×2 IMPLANT
PATTIES SURGICAL .25X.25 (GAUZE/BANDAGES/DRESSINGS) ×1 IMPLANT
PIN DISTRATION 14MM (PIN) IMPLANT
PLATE ACP 1.6V PLATE 34 2L (Plate) IMPLANT
POSITIONER HEAD DONUT 9IN (MISCELLANEOUS) ×1 IMPLANT
PUTTY BONE DBX 2.5 MIS (Bone Implant) IMPLANT
RESTRAINT LIMB HOLDER UNIV (RESTRAINTS) ×1 IMPLANT
SCREW ACP VA SD 3.5X15 (Screw) IMPLANT
SCREW VA ST 4.0X15 (Screw) IMPLANT
SPACER C HEDRON 12X14 6 7D (Spacer) IMPLANT
SPONGE INTESTINAL PEANUT (DISPOSABLE) ×1 IMPLANT
SPONGE SURGIFOAM ABS GEL 100 (HEMOSTASIS) ×1 IMPLANT
SPONGE T-LAP 4X18 ~~LOC~~+RFID (SPONGE) ×2 IMPLANT
SURGIFLO W/THROMBIN 8M KIT (HEMOSTASIS) IMPLANT
SUT BONE WAX W31G (SUTURE) ×1 IMPLANT
SUT MNCRL AB 3-0 PS2 27 (SUTURE) ×1 IMPLANT
SUT SILK 2 0 (SUTURE)
SUT SILK 2-0 18XBRD TIE 12 (SUTURE) IMPLANT
SUT VIC AB 2-0 CT1 18 (SUTURE) ×1 IMPLANT
SYR BULB IRRIG 60ML STRL (SYRINGE) ×1 IMPLANT
SYR CONTROL 10ML LL (SYRINGE) ×1 IMPLANT
TAPE CLOTH 4X10 WHT NS (GAUZE/BANDAGES/DRESSINGS) ×1 IMPLANT
TAPE UMBILICAL 1/8X30 (MISCELLANEOUS) ×1 IMPLANT
TOWEL GREEN STERILE (TOWEL DISPOSABLE) ×1 IMPLANT
TOWEL GREEN STERILE FF (TOWEL DISPOSABLE) ×1 IMPLANT
WATER STERILE IRR 1000ML POUR (IV SOLUTION) ×1 IMPLANT

## 2022-11-25 NOTE — Progress Notes (Signed)
    Subjective: Procedure(s) (LRB): ANTERIOR CERVICAL DISCECTOMY AND FUSION CERVICAL FIVE TO SEVEN (N/A) Day of Surgery  Patient reports pain as 2 on 0-10 scale.  Reports decreased arm pain Reports mild incisional neck pain   Positive void Negative bowel movement Negative flatus Negative chest pain or shortness of breath.  Patient reports 2 episodes of A-fib with brief chest pain which has now resolved.  Objective: Vital signs in last 24 hours: Temp:  [97.6 F (36.4 C)-98.7 F (37.1 C)] 97.6 F (36.4 C) (07/01 1844) Pulse Rate:  [62-104] 83 (07/01 1907) Resp:  [12-18] 18 (07/01 1844) BP: (125-186)/(60-85) 161/82 (07/01 1907) SpO2:  [92 %-100 %] 99 % (07/01 1907) Weight:  [83.9 kg] 83.9 kg (07/01 0551)  Intake/Output from previous day: No intake/output data recorded.  Labs: No results for input(s): "WBC", "RBC", "HCT", "PLT" in the last 72 hours. No results for input(s): "NA", "K", "CL", "CO2", "BUN", "CREATININE", "GLUCOSE", "CALCIUM" in the last 72 hours. No results for input(s): "LABPT", "INR" in the last 72 hours.  Physical Exam: Neurologically intact ABD soft Intact pulses distally Incision: dressing C/D/I Compartment soft Body mass index is 30.32 kg/m.  Assessment/Plan: Patient stable  Neurological exam: No evidence of deficits on motor or sensory testing.  No visual field defects, which pharmacy did this and difficulty with speech, or asymmetrical facial activity.  No evidence to suggest a stroke.  Patient noted transient generalized weakness with an episode of her A-fib.  Symptoms resolved once the rhythm change.  Patient did note episode of chest pain which resolved with sublingual nitro times 2.  She is currently in sinus rhythm.  Hospitalist consult has been requested.  Beta-blocker is pending.  Question need for telemetry, will defer to the hospitalist.  Venita Lick, MD [facility] [phone]

## 2022-11-25 NOTE — Op Note (Signed)
OPERATIVE REPORT  DATE OF SURGERY: 11/25/2022  PATIENT NAME:  NILSA STIMMEL MRN: [id number] DOB: March 15, 1956  PCP: Felix Pacini A, DO  PRE-OPERATIVE DIAGNOSIS: Cervical spondylitic radiculopathy C5-7  POST-OPERATIVE DIAGNOSIS: Same  PROCEDURE:   ACDF C5-7  SURGEON:  Venita Lick, MD  PHYSICIAN ASSISTANT: Roderic Palau, PA student  ANESTHESIA:   General  EBL: 25 ml   Complications: None  Implants: NuVasive titanium intervertebral cage.  6 mm lordotic small x 2.  34 mm anterior cervical NuVasive ACP plate with 15 mm locking screws  Graft: DBX mix  BRIEF HISTORY: RANDI JAQUES is a 67 y.o. female who presented with significant neck and radicular right arm pain.  Despite appropriate conservative management her quality of life continued to deteriorate.  Given the failure of conservative management to produce any long-lasting positive improvement we elected to move forward with surgery.  Patient had foraminal stenosis with degenerative disc disease with nerve irritation C5-6 and C6-7.  As result we elected to move forward with a two-level ACDF C5-7.  Risks, benefits, and alternatives to surgery were discussed with the patient consent was obtained.  PROCEDURE DETAILS: Patient was brought into the operating room and was properly positioned on the operating room table.  After induction with general anesthesia the patient was endotracheally intubated.  A timeout was taken to confirm all important data: including patient, procedure, and the level. Teds, SCD's were applied.   The anterior cervical spine was then prepped and draped in a standard fashion.  Using fluoroscopy I marked out the incision site centered over the C6 vertebral body.  I infiltrated the incision with quarter percent Marcaine with epinephrine and then made my transverse incision.  I sharply dissected down to the platysma.  I then isolated the platysma and transected.  I identified the avascular plane starting on the medial  border the sternocleidomastoid and proceeding down to the omohyoid.  The omohyoid was identified, isolated, and transected for better visualization.  The carotid sheath was identified and protected laterally with a finger and I swept the esophagus and trachea to the right to expose the anterior longitudinal ligament.  Hand-held retractor was then placed to hold the trach and esophagus in place and I used Kitner dissectors to completely expose the anterior surface of the spine.  Needle was placed into the C5-6 disc space and an intraoperative fluoroscopy view was used to confirm that we are at the appropriate level.  Once this was confirmed I then marked the disc space with the Bovie.  I then mobilized the longus coli muscles from the midportion of the C5 vertebral body to the midportion of the C7 vertebral body.  Once I had the anterior spine exposed the large anterior exostosis were removed with a double-action Leksell rongeur.  I then measured and placed a 50 mm Caspar retracting blade into the wound.  The blades were placed under the longus coli muscle.  I deflated the endotracheal cuff and expanded the retractor to the appropriate width and then reinflated the endotracheal cuff.  I now had excellent visualization of the C6-7 disc space.  Annulotomy was performed and I used pituitary rongeurs, curettes, and Kerrison rongeurs to remove all of the disc material.  Distraction pins were placed into the body of C6 and C7 and I gently distracted the intervertebral space and maintained it for visualization using a traction pin set.  Using a fine nerve hook I developed a plane underneath the posterior annulus and then resected the posterior annulus  and posterior osteophyte from the vertebral bodies of C6 and C7 with a 1 mm Kerrison rongeur.  I then went under the uncovertebral joint and decompressed this as well.  I then under live fluoroscopy confirmed to had parallel endplate distraction.  I then used a nerve hook to  release the posterior longitudinal ligament.  At this point I was quite pleased with the discectomy/decompression.  The endplates were rasped both bleeding subchondral bone..  I then trialed and elected to use the 6 mm small lordotic implant.  The actual implant was packed with bone graft and malleted to the appropriate depth.  I confirmed satisfactory positioning with fluoroscopy.  At this point I repositioned the Caspar retractors to expose the C5-6 disc space.  Indication I repositioned the distraction pins and distracted the C5-6 disc space. Using the same technique I performed a complete discectomy at this level.  Once I confirmed parallel endplate distraction and adequate decompression under the uncovertebral joints and remove the posterior osteophyte I trialed and used the same size implant at this level.  I can packed the implant with DBX mix and malleted to the appropriate depth.  The wound was then copiously irrigated with normal saline.  The extra amount of bone was then placed anterior to the cage to serve as a sentinel fusion.  If 34 mm anterior cervical plate was contoured to the lordosis of the cervical spine and secured with 15 mm locking screws in the bodies of C5, C6, and C7.  All 6 screws had excellent purchase.  Once the screws were at the appropriate depth I then locked them according manufacture standards to prevent backout.  I then removed all of the retracting devices and make sure the esophagus was not entrapped beneath the cage.  I then irrigated the wound copiously normal saline and make sure that hemostasis.  The esophagus and trachea returned to midline and closed platysma with interrupted 2-0 Vicryl sutures followed by 3-0 Monocryl for the skin.  Steri-Strips and dry dressings were applied and the patient was ultimately extubated transfer the PACU without incident.  The case all needle sponge counts were correct.  Venita Lick, MD 11/25/2022 10:59 AM

## 2022-11-25 NOTE — Transfer of Care (Signed)
Immediate Anesthesia Transfer of Care Note  Patient: [name] [name]  Procedure(s) Performed: ANTERIOR CERVICAL DISCECTOMY AND FUSION CERVICAL FIVE TO SEVEN (Spine Cervical)  Patient Location: PACU  Anesthesia Type:General  Level of Consciousness: awake and drowsy  Airway & Oxygen Therapy: Patient connected to nasal cannula oxygen  Post-op Assessment: Report given to RN, Post -op Vital signs reviewed and stable, and Patient moving all extremities  Post vital signs: stable  Last Vitals:  Vitals Value Taken Time  BP 179/77 11/25/22 1102  Temp    Pulse 77 11/25/22 1109  Resp 13 11/25/22 1109  SpO2 92 % 11/25/22 1109  Vitals shown include unvalidated device data.  Last Pain:  Vitals:   11/25/22 0637  TempSrc:   PainSc: 0-No pain         Complications: No notable events documented.

## 2022-11-25 NOTE — Anesthesia Procedure Notes (Signed)
Procedure Name: Intubation Date/Time: 11/25/2022 7:37 AM  Performed by: Kayleen Memos, CRNAPre-anesthesia Checklist: Patient identified, Emergency Drugs available, Suction available, Patient being monitored and Timeout performed Patient Re-evaluated:Patient Re-evaluated prior to induction Oxygen Delivery Method: Circle system utilized Preoxygenation: Pre-oxygenation with 100% oxygen Induction Type: IV induction and Rapid sequence Laryngoscope Size: Glidescope and 3 Grade View: Grade I Tube type: Reinforced Tube size: 7.0 mm Number of attempts: 1 Airway Equipment and Method: Rigid stylet and Video-laryngoscopy Placement Confirmation: ETT inserted through vocal cords under direct vision, positive ETCO2, CO2 detector and breath sounds checked- equal and bilateral Secured at: 20 cm Tube secured with: Tape Dental Injury: Teeth and Oropharynx as per pre-operative assessment

## 2022-11-25 NOTE — Brief Op Note (Signed)
11/25/2022  11:08 AM  PATIENT:  [name] [name]  67 y.o. female  PRE-OPERATIVE DIAGNOSIS:  Degenerative C5-7 with neck radicular arm pain  POST-OPERATIVE DIAGNOSIS:  * No post-op diagnosis entered *  PROCEDURE:  Procedure(s): ANTERIOR CERVICAL DISCECTOMY AND FUSION CERVICAL FIVE TO SEVEN (N/A)  SURGEON:  Surgeon(s) and Role:    Venita Lick, MD - Primary  PHYSICIAN ASSISTANT:   ASSISTANTS: Luther Bradley PA student   ANESTHESIA:   general  EBL:  25   BLOOD ADMINISTERED:none  DRAINS: none   LOCAL MEDICATIONS USED:  MARCAINE     SPECIMEN:  No Specimen  DISPOSITION OF SPECIMEN:  N/A  COUNTS:  YES  TOURNIQUET:  * No tourniquets in log *  DICTATION: .Dragon Dictation  PLAN OF CARE: Admit for overnight observation  PATIENT DISPOSITION:  PACU - hemodynamically stable.

## 2022-11-25 NOTE — H&P (Addendum)
Consult   [name] [name] ZOX:[id number] DOB: 05-21-1956 DOA: 11/25/2022  PCP: Natalia Leatherwood, DO   Chief Complaint: heart palpitaitons  HPI: [name] [name] is a 67 y.o. female with medical history significant of A-fib, CAD who presented for admission for anterior cervical discectomy.  This procedure was uncomplicated.  On the floor she developed some heart fluttering and was found to have heart rate in the low 100s.  She was held of her metoprolol.  She was given metoprolol with improvement.  She had another episode of some chest discomfort with elevated heart rate.  She was given IV metoprolol and hospitalist were consulted for further management.  Of note patient has a history of atrial fibrillation on Eliquis.  She follows with her cardiologist.  She has has a history of CAD and had drug-eluting stent placed to proximal LAD in 2019.  Regarding her atrial fibrillation she has been rate controlled with metoprolol 25 mg twice a day.  She has been at goal has not required any recent changes in therapy.  I discussed the case with neurosurgeon Dr. Shon Baton and we will move patient to a telemetry bed for close monitoring.  In addition we will resume her metoprolol   Review of Systems: Review of Systems  All other systems reviewed and are negative.    As per HPI otherwise 10 point review of systems negative.   Allergies  Allergen Reactions   Epinephrine Other (See Comments)    Pass out    Other     Other reaction(s): Myalgia   Statins Other (See Comments)    Myalgias: Simvastatin also caused back ache   Welchol [Colesevelam Hcl]     myalgia   Amoxicillin Itching and Rash    Has patient had a PCN reaction causing immediate rash, facial/tongue/throat swelling, SOB or lightheadedness with hypotension: No Has patient had a PCN reaction causing severe rash involving mucus membranes or skin necrosis: No Has patient had a PCN reaction that required hospitalization: No Has patient had a PCN  reaction occurring within the last 10 years: Yes If all of the above answers are "NO", then may proceed with Cephalosporin use.     Past Medical History:  Diagnosis Date   Accelerating angina (HCC) 11/04/2017   Allergy    Asthma    Atrial fibrillation (HCC)    Cataract    Chronic pain disorder    neck and shoulder   Chronic pain of right upper extremity 11/10/2017   Coronary artery disease 2019   Stent x2   CTS (carpal tunnel syndrome) 01/27/2013   right   Diabetes mellitus type II    Diabetic gastroparesis (HCC)    Dysrhythmia    A. Fib   Esophageal reflux 08/25/2013   [facility] [place], Dr Vonda Antigua   FATTY LIVER DISEASE 04/27/2007   Qualifier: Diagnosis of  By: Terrilee Croak CMA, Darlene     Focal muscle atrophy 09/26/2015   Fundic gland polyps of stomach, benign    Globus sensation 04/13/2018   Hordeolum externum of right upper eyelid 12/24/2021   HTN (hypertension)    Hx of adenomatous colonic polyps 12/10/2021   Hyperlipidemia, mixed 05/04/2007   Qualifier: Diagnosis of  By: Nena Jordan crestor caused myalgias even at low dose Livalo caused myalgias Lipitor  Mother with severe reaction myalgias Simvastatin, Welchol    Hypokalemia 05/25/2013   Improved stopping HCTZ. Was noted to have an elevated glucose when K was low. Recheck renal next week after starting  Maxzide   Hypomagnesemia 08/10/2014   Hypothyroidism    Iron deficiency anemia 08/07/2018   Iron malabsorption 08/07/2018   Laryngitis 08/25/2017   Leg cramps 04/15/2016   Nausea without vomiting 08/08/2014   NONSPEC ELEVATION OF LEVELS OF TRANSAMINASE/LDH 05/01/2007   Qualifier: Diagnosis of  By: Francesca Jewett     Osteoarthritis    chronic, right knee (11/04/2017)   Plantar fasciitis    Pneumonia    Rosacea 10/22/2016   Urine incontinence     Past Surgical History:  Procedure Laterality Date   AUGMENTATION MAMMAPLASTY Bilateral 2002   BLADDER SUSPENSION  1981   CATARACT EXTRACTION W/ INTRAOCULAR LENS  IMPLANT Bilateral    COLONOSCOPY     CORONARY PRESSURE/FFR STUDY N/A 11/04/2017   Procedure: INTRAVASCULAR PRESSURE WIRE/FFR STUDY;  Surgeon: Yvonne Kendall, MD;  Location: [facility];  Service: Cardiovascular;  Laterality: N/A;   CORONARY STENT INTERVENTION N/A 11/04/2017   Procedure: CORONARY STENT INTERVENTION;  Surgeon: Yvonne Kendall, MD;  Location: [facility];  Service: Cardiovascular;  Laterality: N/A;   CORONARY ULTRASOUND/IVUS N/A 11/04/2017   Procedure: Intravascular Ultrasound/IVUS;  Surgeon: Yvonne Kendall, MD;  Location: [facility];  Service: Cardiovascular;  Laterality: N/A;   ENDOVENOUS ABLATION SAPHENOUS VEIN W/ LASER Left 12/29/2019   endovenous laser ablation left greater saphenous vein by Fabienne Bruns MD    ESOPHAGOGASTRODUODENOSCOPY     KNEE ARTHROSCOPY Right 1998, [id number]   LAPAROSCOPIC CHOLECYSTECTOMY  2007   LEFT HEART CATH AND CORONARY ANGIOGRAPHY N/A 11/04/2017   Procedure: LEFT HEART CATH AND CORONARY ANGIOGRAPHY;  Surgeon: Yvonne Kendall, MD;  Location: [facility];  Service: Cardiovascular;  Laterality: N/A;   LEFT HEART CATHETERIZATION WITH CORONARY ANGIOGRAM N/A 11/21/2011   Procedure: LEFT HEART CATHETERIZATION WITH CORONARY ANGIOGRAM;  Surgeon: Wendall Stade, MD;  Location: [facility];  Service: Cardiovascular;  Laterality: N/A;   TONSILLECTOMY  1975   VAGINAL HYSTERECTOMY  1983   "still have my ovaries"     reports that she has never smoked. She has never been exposed to tobacco smoke. She has never used smokeless tobacco. She reports that she does not currently use alcohol. She reports that she does not use drugs.  Family History  Problem Relation Age of Onset   Alzheimer's disease Mother    Diabetes type II Mother    Hiatal hernia Mother    Diabetes Mother    Emphysema Father    COPD Father    Depression Sister        suicide   Diabetes Sister    Diabetes Daughter    Colon cancer Neg Hx    Stomach  cancer Neg Hx    Esophageal cancer Neg Hx    Rectal cancer Neg Hx     Prior to Admission medications   Medication Sig Start Date End Date Taking? Authorizing Provider  Calcium Carbonate (CALTRATE 600 PO) Take 650 mg by mouth daily.   Yes [provider]  Carboxymeth-Glyc-Polysorb PF (REFRESH OPTIVE MEGA-3) 0.5-1-0.5 % SOLN Place 1 drop into both eyes 3 (three) times daily.   Yes [provider]  Cyanocobalamin (VITAMIN B 12) 500 MCG TABS Take 500 mg by mouth daily.   Yes [provider]  ELIQUIS 5 MG TABS tablet TAKE 1 TABLET TWICE A DAY 07/02/22  Yes Rollene Rotunda, MD  ezetimibe (ZETIA) 10 MG tablet Take 10 mg by mouth daily.   Yes [provider]  fenofibrate 160 MG tablet Take 1  tablet (160 mg total) by mouth daily. 07/09/22  Yes Rollene Rotunda, MD  fexofenadine (ALLEGRA) 180 MG tablet Take 180 mg by mouth daily.   Yes [provider]  isosorbide mononitrate (IMDUR) 30 MG 24 hr tablet Take 1 tablet (30 mg total) by mouth daily. 08/15/22  Yes Rollene Rotunda, MD  ketoconazole (NIZORAL) 2 % shampoo Apply 1 Application topically 2 (two) times a week. 11/11/22  Yes [provider]  levothyroxine (SYNTHROID) 88 MCG tablet Take 88 mcg by mouth every morning. 09/26/21  Yes [provider]  Magnesium 250 MG TABS Take 250 mg by mouth daily.   Yes [provider]  metFORMIN (GLUCOPHAGE-XR) 500 MG 24 hr tablet Take 500 mg by mouth 2 (two) times daily. 10/22/22  Yes [provider]  methocarbamol (ROBAXIN) 500 MG tablet Take 1 tablet (500 mg total) by mouth every 8 (eight) hours as needed for up to 5 days for muscle spasms. 11/25/22 11/30/22 Yes Venita Lick, MD  metoprolol tartrate (LOPRESSOR) 25 MG tablet TAKE 1 TABLET TWICE A DAY. TAKE AN EXTRA DOSE FOR BREAK THROUGH ATRIAL FIBRILLATION WITH INCREASED HEART RATE GREATER THAN 100. 06/03/22  Yes Rollene Rotunda, MD  nitroGLYCERIN (NITROSTAT) 0.4 MG SL tablet Place 1 tablet (0.4 mg  total) under the tongue every 5 (five) minutes as needed for chest pain. 08/02/21  Yes Hochrein, Fayrene Fearing, MD  NOVOLIN 70/30 KWIKPEN (70-30) 100 UNIT/ML KwikPen Inject 14-25 Units into the skin See admin instructions. Take 24 mg in the morning 14 mg at lunch and 25 mg a bedtime 03/15/21  Yes [provider]  ondansetron (ZOFRAN) 4 MG tablet Take 1 tablet (4 mg total) by mouth every 8 (eight) hours as needed for nausea or vomiting. 11/25/22  Yes Venita Lick, MD  oxyCODONE-acetaminophen (PERCOCET) 10-325 MG tablet Take 1 tablet by mouth every 6 (six) hours as needed for up to 5 days for pain. 11/25/22 11/30/22 Yes Venita Lick, MD  pantoprazole (PROTONIX) 40 MG tablet Take 1 tablet (40 mg total) by mouth 2 (two) times daily before a meal. 09/30/22  Yes Iva Boop, MD  pregabalin (LYRICA) 50 MG capsule Take 1 capsule (50 mg total) by mouth 2 (two) times daily. 08/08/22  Yes Kuneff, [name], DO  Probiotic Product (PROBIOTIC DAILY PO) Take 1 tablet by mouth daily.   Yes [provider]  Study - VESALIUS - evolocumab (AMG 145) 140 mg/mL or placebo SQ injection (PI-Hilty) Inject 1 mL (140 mg total) into the skin every 14 (fourteen) days. Return kit at next visit.If any questions or concerns occur regarding this medication, please contact [facility]. **For Investigational Drug Use Only** 10/01/22  Yes Hilty, Lisette Abu, MD  Accu-Chek Softclix Lancets lancets Use 4 times daily as needed or directed 03/09/20   Romero Belling, MD  glucose blood (ACCU-CHEK GUIDE) test strip Use 4 times daily as needed or directed. Patient not taking: Reported on 11/05/2022 03/09/20   Romero Belling, MD    Physical Exam: Vitals:   11/25/22 1900 11/25/22 1907 11/25/22 1909 11/25/22 2012  BP: (!) 158/78 (!) 161/82 (!) 154/82 (!) 154/64  Pulse: (!) 104 83 87 65  Resp:    18  Temp:    98.2 F (36.8 C)  TempSrc:    Oral  SpO2: 99% 99% 99% 97%  Weight:      Height:       Physical Exam Constitutional:       Appearance: She is normal weight.  HENT:  Head: Normocephalic.     Nose: Nose normal.     Mouth/Throat:     Mouth: Mucous membranes are moist.     Pharynx: Oropharynx is clear.  Eyes:     Conjunctiva/sclera: Conjunctivae normal.     Pupils: Pupils are equal, round, and reactive to light.  Cardiovascular:     Rate and Rhythm: Normal rate.  Pulmonary:     Effort: Pulmonary effort is normal.     Breath sounds: Normal breath sounds.  Abdominal:     General: Abdomen is flat. Bowel sounds are normal.  Musculoskeletal:        General: Normal range of motion.     Cervical back: Normal range of motion.  Skin:    General: Skin is warm.     Capillary Refill: Capillary refill takes less than 2 seconds.  Neurological:     General: No focal deficit present.     Mental Status: She is alert. Mental status is at baseline.        Labs on Admission: I have personally reviewed the patients's labs and imaging studies.  Assessment/Plan Principal Problem:   S/P cervical spinal fusion   # Paroxysmal atrial fibrillation most likely exacerbated by operative course - Patient had dose of metoprolol held and subsequently developed A-fib - Patient is on Eliquis which has been held  Plan: Resume metoprolol 25 mg twice daily As needed IV metoprolol 5 mg Obtain echocardiogram Will transfer patient to telemetry floor  # CAD status post stenting-patient had nuclear stress test on 08/13/2022 which showed small defect in the apical anterior lateral locations which are reversible with abnormal wall motion consistent with ischemia  Plan: Obtain echocardiogram As needed nitroglycerin Obtain troponin and EKG Continue Imdur  # Hyperlipidemia-continue Zetia, fibrate  Type 2 diabetes-continue sliding scale    Admission status: Observation Telemetry Medical  Certification: The appropriate patient status for this patient is OBSERVATION. Observation status is judged to be reasonable and necessary  in order to provide the required intensity of service to ensure the patient's safety. The patient's presenting symptoms, physical exam findings, and initial radiographic and laboratory data in the context of their medical condition is felt to place them at decreased risk for further clinical deterioration. Furthermore, it is anticipated that the patient will be medically stable for discharge from the hospital within 2 midnights of admission.     Alan Mulder MD [facility] If 7PM-7AM, please contact night-coverage [email or web address]  11/25/2022, 9:15 PM

## 2022-11-25 NOTE — H&P (Signed)
History:  [name] [name] is a very pleasant 67 year old woman whose had progressive neck and neuropathic right arm pain. Despite appropriate conservative measures consisting of injection and physical therapy is her quality of life is deteriorated. Patient's clinical exam is consistent with C5-6 and C6-7 degenerative cervical radiculopathy as result we have elected to move forward with a ACDF C5-7.  Past Medical History:  Diagnosis Date   Accelerating angina (HCC) 11/04/2017   Allergy    Asthma    Atrial fibrillation (HCC)    Cataract    Chronic pain disorder    neck and shoulder   Chronic pain of right upper extremity 11/10/2017   Coronary artery disease 2019   Stent x2   CTS (carpal tunnel syndrome) 01/27/2013   right   Diabetes mellitus type II    Diabetic gastroparesis (HCC)    Dysrhythmia    A. Fib   Esophageal reflux 08/25/2013   [facility] [place], Dr Vonda Antigua   FATTY LIVER DISEASE 04/27/2007   Qualifier: Diagnosis of  By: Terrilee Croak CMA, Darlene     Focal muscle atrophy 09/26/2015   Fundic gland polyps of stomach, benign    Globus sensation 04/13/2018   Hordeolum externum of right upper eyelid 12/24/2021   HTN (hypertension)    Hx of adenomatous colonic polyps 12/10/2021   Hyperlipidemia, mixed 05/04/2007   Qualifier: Diagnosis of  By: Nena Jordan crestor caused myalgias even at low dose Livalo caused myalgias Lipitor  Mother with severe reaction myalgias Simvastatin, Welchol    Hypokalemia 05/25/2013   Improved stopping HCTZ. Was noted to have an elevated glucose when K was low. Recheck renal next week after starting Maxzide   Hypomagnesemia 08/10/2014   Hypothyroidism    Iron deficiency anemia 08/07/2018   Iron malabsorption 08/07/2018   Laryngitis 08/25/2017   Leg cramps 04/15/2016   Nausea without vomiting 08/08/2014   NONSPEC ELEVATION OF LEVELS OF TRANSAMINASE/LDH 05/01/2007   Qualifier: Diagnosis of  By: Francesca Jewett     Osteoarthritis    chronic, right knee  (11/04/2017)   Plantar fasciitis    Pneumonia    Rosacea 10/22/2016   Urine incontinence     Allergies  Allergen Reactions   Epinephrine Other (See Comments)    Pass out    Other     Other reaction(s): Myalgia   Statins Other (See Comments)    Myalgias: Simvastatin also caused back ache   Welchol [Colesevelam Hcl]     myalgia   Amoxicillin Itching and Rash    Has patient had a PCN reaction causing immediate rash, facial/tongue/throat swelling, SOB or lightheadedness with hypotension: No Has patient had a PCN reaction causing severe rash involving mucus membranes or skin necrosis: No Has patient had a PCN reaction that required hospitalization: No Has patient had a PCN reaction occurring within the last 10 years: Yes If all of the above answers are "NO", then may proceed with Cephalosporin use.     No current facility-administered medications on file prior to encounter.   Current Outpatient Medications on File Prior to Encounter  Medication Sig Dispense Refill   Calcium Carbonate (CALTRATE 600 PO) Take 650 mg by mouth daily.     Carboxymeth-Glyc-Polysorb PF (REFRESH OPTIVE MEGA-3) 0.5-1-0.5 % SOLN Place 1 drop into both eyes 3 (three) times daily.     Cyanocobalamin (VITAMIN B 12) 500 MCG TABS Take 500 mg by mouth daily.     ELIQUIS 5 MG TABS tablet TAKE 1 TABLET TWICE A DAY  180 tablet 1   ezetimibe (ZETIA) 10 MG tablet Take 10 mg by mouth daily.     fenofibrate 160 MG tablet Take 1 tablet (160 mg total) by mouth daily. 90 tablet 1   fexofenadine (ALLEGRA) 180 MG tablet Take 180 mg by mouth daily.     isosorbide mononitrate (IMDUR) 30 MG 24 hr tablet Take 1 tablet (30 mg total) by mouth daily. 90 tablet 3   ketoconazole (NIZORAL) 2 % shampoo Apply 1 Application topically 2 (two) times a week.     levothyroxine (SYNTHROID) 88 MCG tablet Take 88 mcg by mouth every morning.     Magnesium 250 MG TABS Take 250 mg by mouth daily.     metFORMIN (GLUCOPHAGE-XR) 500 MG 24 hr tablet Take  500 mg by mouth 2 (two) times daily.     metoprolol tartrate (LOPRESSOR) 25 MG tablet TAKE 1 TABLET TWICE A DAY. TAKE AN EXTRA DOSE FOR BREAK THROUGH ATRIAL FIBRILLATION WITH INCREASED HEART RATE GREATER THAN 100. 180 tablet 3   nitroGLYCERIN (NITROSTAT) 0.4 MG SL tablet Place 1 tablet (0.4 mg total) under the tongue every 5 (five) minutes as needed for chest pain. 25 tablet PRN   NOVOLIN 70/30 KWIKPEN (70-30) 100 UNIT/ML KwikPen Inject 14-25 Units into the skin See admin instructions. Take 24 mg in the morning 14 mg at lunch and 25 mg a bedtime     pantoprazole (PROTONIX) 40 MG tablet Take 1 tablet (40 mg total) by mouth 2 (two) times daily before a meal. 180 tablet 3   pregabalin (LYRICA) 50 MG capsule Take 1 capsule (50 mg total) by mouth 2 (two) times daily. 60 capsule 5   Probiotic Product (PROBIOTIC DAILY PO) Take 1 tablet by mouth daily.     Study - VESALIUS - evolocumab (AMG 145) 140 mg/mL or placebo SQ injection (PI-Hilty) Inject 1 mL (140 mg total) into the skin every 14 (fourteen) days. Return kit at next visit.If any questions or concerns occur regarding this medication, please contact [facility]. **For Investigational Drug Use Only** 8 mL 0   Accu-Chek Softclix Lancets lancets Use 4 times daily as needed or directed 360 each 3   glucose blood (ACCU-CHEK GUIDE) test strip Use 4 times daily as needed or directed. (Patient not taking: Reported on 11/05/2022) 100 each 3    Physical Exam: Vitals:   11/25/22 0551  BP: 125/85  Pulse: 64  Resp: 18  Temp: 98.7 F (37.1 C)  SpO2: 96%   Body mass index is 30.32 kg/m. Clinical exam: [name] [name] is a pleasant individual, who appears younger than their stated age.  She is alert and orientated 3.  No shortness of breath, chest pain.  Abdomen is soft and non-tender, negative loss of bowel and bladder control, no rebound tenderness.  Negative: skin lesions abrasions contusions  Peripheral pulses: 2+ peripheral pulses in the upper  extremity. LE compartments are: Soft and nontender.  Gait pattern: Normal  Assistive devices: None  Neuro: 5/5 motor strength in the upper extremity bilaterally. Negative Hoffman test, negative Spurling sign. Positive numbness and dysesthesias in the right C6 and C7 dermatome. negative Lhermitte sign.  Musculoskeletal: significant neck pain radiating from the midline down to the mid scapular region and into the right side of the scapula and arm. Limited range of motion due to pain. Occasional occipital headaches  Imaging: X-rays of the cervical spine demonstrate degenerative cervical disc disease with anterior traction spur C5-7. Lordosis is still well-maintained. No change from 10/2021 x-rays  Cervical MRI: completed on 11/26/2021. No cord signal changes. Small right disc protrusion at C7-T1. Mild bilateral neuroforaminal narrowing and moderate degenerative disc disease C6-7 and C5-6. Slight degenerative anterior listhesis at C4-5 but no central or neuroforaminal stenosis.  Cervical MRI: completed on 10/19/2022. No cord signal changes. Unchanged degenerative disc space narrowing C5-7. Moderate left foraminal stenosis at C5-6 and moderate biforaminal stenosis at C6-7. Mild degenerative changes the remainder of the cervical spine. No significant change from prior imaging study.   A/P: Summary: Demetria is a very pleasant 67 year old man who has ongoing debilitating neck pain with intermittent dysesthesias into the upper extremity. Despite physiotherapy, activity modification, and medications her overall quality of life is deteriorated. Injection therapy also did not prove satisfactory. At this point I am she is expressed a desire to move forward with surgery which I believe is reasonable. I believe her primary pain generators are the degenerative changes C5-6 and C6-7. I think this is best addressed with a two-level ACDF. We have gone over the surgical procedure in great detail, she is reviewed the  informational pamphlet and all of her and her husband's questions were addressed. Risks and benefits of surgery were discussed with the patient. These include: Infection, bleeding, death, stroke, paralysis, ongoing or worse pain, need for additional surgery, nonunion, leak of spinal fluid, adjacent segment degeneration requiring additional fusion surgery. Pseudoarthrosis (nonunion)requiring supplemental posterior fixation. Throat pain, swallowing difficulties, hoarseness or change in voice.

## 2022-11-25 NOTE — Progress Notes (Signed)
Called to the by patient husband, find patient very  weak unable to move her arms. See flowsheet for v/s, 12 lead EKG  done. RR called and MD notified. patient have episode x3 , the third time patient c/o chest pain. CP was relieved with 2 nitroglycerin also 5 mg iv metoprolol given per order.will continue to monitor the patient.

## 2022-11-25 NOTE — Anesthesia Postprocedure Evaluation (Signed)
Anesthesia Post Note  Patient: [name] [name]  Procedure(s) Performed: ANTERIOR CERVICAL DISCECTOMY AND FUSION CERVICAL FIVE TO SEVEN (Spine Cervical)     Patient location during evaluation: PACU Anesthesia Type: General Level of consciousness: awake and alert and oriented Pain management: pain level controlled Vital Signs Assessment: post-procedure vital signs reviewed and stable Respiratory status: nonlabored ventilation, respiratory function stable and spontaneous breathing Cardiovascular status: blood pressure returned to baseline and stable Postop Assessment: no apparent nausea or vomiting Anesthetic complications: no   No notable events documented.  Last Vitals:  Vitals:   11/25/22 1130 11/25/22 1202  BP: 131/65 (!) 145/60  Pulse: 79 66  Resp: 13 18  Temp: 37 C 36.5 C  SpO2: 94% 94%    Last Pain:  Vitals:   11/25/22 1202  TempSrc: Oral  PainSc:                  Benjermin Korber A.

## 2022-11-25 NOTE — Discharge Instructions (Addendum)
Today you will be discharged from the hospital.  The purpose of the following handout is to help guide you over the next 2 weeks.  First and foremost, be sure you have a follow up appointment with Dr. Rolena Infante 2 weeks from the time of your surgery to have your sutures removed.  Please call [place] [phone] to schedule or confirm this appointment.      Brace You do not have to wear the collar while lying in bed or sitting in a high-backed chair, eating, sleeping or showering.  Other than these instances, you must wear the brace.  You may NOT wear the collar while driving a vehicle (see driving restrictions below).  It is advisable that you wear the collar in public places or while traveling in a car as a passenger.  Dr. Rolena Infante will discuss further use of the collar at your 2 week postop visit.  Wound Care You may SHOWER 5 days from the date of surgery.  Shower directly over the steri-strips.  DO NOT scrub or submerge (bath tub, swimming pool, hot tub, etc.) the area.  Pat to dry following your shower.  There is no need for additional dressings other than the steri-strips.  Allow the steri-strips to fall off on their own.  Once the strips have fallen off, you may leave the area undressed.  DO NOT apply lotion/cream/ointment to the area.  The wound must remain dry at all times other than while showering.  Dr. Rolena Infante or his staff will remove your stiches at your first postop visit and give you additional instructions regarding wound care at that time.   Activity NO DRIVING FOR 2 WEEKS.  No lifting over 5 pounds (approximately a gallon of milk).  No bending, stooping, squatting or twisting.  No overhead activities.  We encourage you to walk (short distances and often throughout the day) as you can tolerate.  A good rule of thumb is to get up and move once or twice every hour.  You may go up and down stairs carefully.  As you continue to recover, Dr. Rolena Infante will address and  adjust restrictions to your activities until no further restrictions are needed.  However, until your first postop visit, when Dr. Rolena Infante can assess your recovery, you are to follow these instructions.  At the end of this document is a tentative outline of activities for up to 1 year.       Medication You will be discharged from the hospital with medication for pain, spasm, nausea and constipation.  You will be given enough medication to last until your first postop visit in 2 weeks.  Medications WILL NOT BE REFILLED EARLY; therefore, you are to take the medications only as directed.  If you have been given multiple prescriptions, please leave them with your pharmacy.  They can keep them on file for when you need them.  Medications that are lost or stolen WILL NOT be replaced.  We will address the need for continuing certain medications on an individual basis during your postop visit.  We ask that you avoid over the counter anti-inflammatory medications (Advil, Aleve, Motrin) for 3 months.    What you can expect following neck surgery... It is not uncommon to experience a sore throat or difficulty swallowing following neck surgery.  Cold liquids and soft foods are helpful in soothing this discomfort.  There is no specific diet that you are to follow after surgery, however, there are a  few things you should keep in mind to avoid unneeded discomfort.  Take small bites and eat slowly.  Chew your food thoroughly before swallowing.   It is not uncommon to experience incisional soreness or pain in the back of the neck, shoulders or between the shoulder blades.  These symptoms will slowly begin to resolve as you continue to recover, however, they can last for a few weeks.    It is not uncommon to experience INTERMITTENT arm pain following surgery.  This pain can mimic the arm pain you had prior to surgery.  As long as the pain resolves on its own and is not constant, there is no need to become alarmed.    When To Call If you experience fever >101F, loss of bowel or bladder control, painful swelling in the lower extremities, constant (unresolving) arm pain.  If you experience any of these symptoms, please call [facility] [phone].  What's Next As mentioned earlier, you will follow up with Dr. Shon Baton in 2 weeks.  At that time, we will likely remove your stitches and discuss additional aspects of your recovery.    RESTART ELIQUIS [facility] 11/27/22.  CALL IF YOU EXPERIENCE BLEEDING, OR HEMATOMA FORMATION                 ACTIVITY GUIDELINES ANTERIOR CERVICAL DISECTOMY AND FUSION  Activity Discharge 2 weeks 6 weeks 3 months 6 months 1 year  Shower 5 days        Submerge the wound  no no yes     Walking outdoors yes       Lifting 5 lbs yes       Climbing stairs yes       Cooking yes       Car rides (less than 30 minutes) yes       Car rides (greater than 30 minutes) no varies yes     Air travel no varies yes     Short outings (church, visits, etc...) yes       School no no yes     Driving a car no no varies yes    Light upper extremity exercises no no varies yes    Stationary bike no no yes     Swimming (no diving) no no no varies yes   Vacuuming, laundry, mopping no no no varies yes   Biking outdoors no no no no varies yes  Light jogging no no no varies yes   Low impact aerobics no no no varies yes   Non-contact sports (tennis, golf) no no no varies yes   Hunting (no tree climbing) no no no varies yes   Dancing (non-gymnastics) no no no varies yes   Down-hill skiing (experienced skier) no no no no yes   Down-hill skiing (novice) no no no no yes   Cross-country skiing no no no no yes   Horseback riding (noncompetitive)  no no no no yes   Horseback riding (competitive) no no no no varies yes  Gardening/landscaping no no no varies yes   House repairs no no no varies varies yes  Lifting up to 50 lbs no no no no varies yes

## 2022-11-25 NOTE — Final Consult Note (Signed)
Consult   Ahmaya J Aho MRN:[id number] DOB: 02/08/1956 DOA: 11/25/2022  PCP: Kuneff, [name], DO   Chief Complaint: heart palpitaitons  HPI: [name] [name] is a 67 y.o. female with medical history significant of A-fib, CAD who presented for admission for anterior cervical discectomy.  This procedure was uncomplicated.  On the floor she developed some heart fluttering and was found to have heart rate in the low 100s.  She was held of her metoprolol.  She was given metoprolol with improvement.  She had another episode of some chest discomfort with elevated heart rate.  She was given IV metoprolol and hospitalist were consulted for further management.  Of note patient has a history of atrial fibrillation on Eliquis.  She follows with her cardiologist.  She has has a history of CAD and had drug-eluting stent placed to proximal LAD in 2019.  Regarding her atrial fibrillation she has been rate controlled with metoprolol 25 mg twice a day.  She has been at goal has not required any recent changes in therapy.  I discussed the case with neurosurgeon Dr. Brooks and we will move patient to a telemetry bed for close monitoring.  In addition we will resume her metoprolol   Review of Systems: Review of Systems  All other systems reviewed and are negative.    As per HPI otherwise 10 point review of systems negative.   Allergies  Allergen Reactions   Epinephrine Other (See Comments)    Pass out    Other     Other reaction(s): Myalgia   Statins Other (See Comments)    Myalgias: Simvastatin also caused back ache   Welchol [Colesevelam Hcl]     myalgia   Amoxicillin Itching and Rash    Has patient had a PCN reaction causing immediate rash, facial/tongue/throat swelling, SOB or lightheadedness with hypotension: No Has patient had a PCN reaction causing severe rash involving mucus membranes or skin necrosis: No Has patient had a PCN reaction that required hospitalization: No Has patient had a PCN  reaction occurring within the last 10 years: Yes If all of the above answers are "NO", then may proceed with Cephalosporin use.     Past Medical History:  Diagnosis Date   Accelerating angina (HCC) 11/04/2017   Allergy    Asthma    Atrial fibrillation (HCC)    Cataract    Chronic pain disorder    neck and shoulder   Chronic pain of right upper extremity 11/10/2017   Coronary artery disease 2019   Stent x2   CTS (carpal tunnel syndrome) 01/27/2013   right   Diabetes mellitus type II    Diabetic gastroparesis (HCC)    Dysrhythmia    A. Fib   Esophageal reflux 08/25/2013   [facility], Dr Hurrlbrink   FATTY LIVER DISEASE 04/27/2007   Qualifier: Diagnosis of  By: Knight CMA, Darlene     Focal muscle atrophy 09/26/2015   Fundic gland polyps of stomach, benign    Globus sensation 04/13/2018   Hordeolum externum of right upper eyelid 12/24/2021   HTN (hypertension)    Hx of adenomatous colonic polyps 12/10/2021   Hyperlipidemia, mixed 05/04/2007   Qualifier: Diagnosis of  By: Yoo DO, D. Zoanne Newill crestor caused myalgias even at low dose Livalo caused myalgias Lipitor  Mother with severe reaction myalgias Simvastatin, Welchol    Hypokalemia 05/25/2013   Improved stopping HCTZ. Was noted to have an elevated glucose when K was low. Recheck renal next week after starting   Maxzide   Hypomagnesemia 08/10/2014   Hypothyroidism    Iron deficiency anemia 08/07/2018   Iron malabsorption 08/07/2018   Laryngitis 08/25/2017   Leg cramps 04/15/2016   Nausea without vomiting 08/08/2014   NONSPEC ELEVATION OF LEVELS OF TRANSAMINASE/LDH 05/01/2007   Qualifier: Diagnosis of  By: Hodgin, Sue     Osteoarthritis    chronic, right knee (11/04/2017)   Plantar fasciitis    Pneumonia    Rosacea 10/22/2016   Urine incontinence     Past Surgical History:  Procedure Laterality Date   AUGMENTATION MAMMAPLASTY Bilateral 2002   BLADDER SUSPENSION  1981   CATARACT EXTRACTION W/ INTRAOCULAR LENS  IMPLANT Bilateral    COLONOSCOPY     CORONARY PRESSURE/FFR STUDY N/A 11/04/2017   Procedure: INTRAVASCULAR PRESSURE WIRE/FFR STUDY;  Surgeon: End, Christopher, MD;  Location: [facility];  Service: Cardiovascular;  Laterality: N/A;   CORONARY STENT INTERVENTION N/A 11/04/2017   Procedure: CORONARY STENT INTERVENTION;  Surgeon: End, Christopher, MD;  Location: [facility];  Service: Cardiovascular;  Laterality: N/A;   CORONARY ULTRASOUND/IVUS N/A 11/04/2017   Procedure: Intravascular Ultrasound/IVUS;  Surgeon: End, Christopher, MD;  Location: [facility];  Service: Cardiovascular;  Laterality: N/A;   ENDOVENOUS ABLATION SAPHENOUS VEIN W/ LASER Left 12/29/2019   endovenous laser ablation left greater saphenous vein by Charles Fields MD    ESOPHAGOGASTRODUODENOSCOPY     KNEE ARTHROSCOPY Right 1998, 2007,2016   LAPAROSCOPIC CHOLECYSTECTOMY  2007   LEFT HEART CATH AND CORONARY ANGIOGRAPHY N/A 11/04/2017   Procedure: LEFT HEART CATH AND CORONARY ANGIOGRAPHY;  Surgeon: End, Christopher, MD;  Location: [facility];  Service: Cardiovascular;  Laterality: N/A;   LEFT HEART CATHETERIZATION WITH CORONARY ANGIOGRAM N/A 11/21/2011   Procedure: LEFT HEART CATHETERIZATION WITH CORONARY ANGIOGRAM;  Surgeon: Peter C Nishan, MD;  Location: [facility];  Service: Cardiovascular;  Laterality: N/A;   TONSILLECTOMY  1975   VAGINAL HYSTERECTOMY  1983   "still have my ovaries"     reports that she has never smoked. She has never been exposed to tobacco smoke. She has never used smokeless tobacco. She reports that she does not currently use alcohol. She reports that she does not use drugs.  Family History  Problem Relation Age of Onset   Alzheimer's disease Mother    Diabetes type II Mother    Hiatal hernia Mother    Diabetes Mother    Emphysema Father    COPD Father    Depression Sister        suicide   Diabetes Sister    Diabetes Daughter    Colon cancer Neg Hx    Stomach  cancer Neg Hx    Esophageal cancer Neg Hx    Rectal cancer Neg Hx     Prior to Admission medications   Medication Sig Start Date End Date Taking? Authorizing Provider  Calcium Carbonate (CALTRATE 600 PO) Take 650 mg by mouth daily.   Yes [provider]  Carboxymeth-Glyc-Polysorb PF (REFRESH OPTIVE MEGA-3) 0.5-1-0.5 % SOLN Place 1 drop into both eyes 3 (three) times daily.   Yes [provider]  Cyanocobalamin (VITAMIN B 12) 500 MCG TABS Take 500 mg by mouth daily.   Yes [provider]  ELIQUIS 5 MG TABS tablet TAKE 1 TABLET TWICE A DAY 07/02/22  Yes Hochrein, James, MD  ezetimibe (ZETIA) 10 MG tablet Take 10 mg by mouth daily.   Yes [provider]  fenofibrate 160 MG tablet Take 1   tablet (160 mg total) by mouth daily. 07/09/22  Yes Hochrein, James, MD  fexofenadine (ALLEGRA) 180 MG tablet Take 180 mg by mouth daily.   Yes [provider]  isosorbide mononitrate (IMDUR) 30 MG 24 hr tablet Take 1 tablet (30 mg total) by mouth daily. 08/15/22  Yes Hochrein, James, MD  ketoconazole (NIZORAL) 2 % shampoo Apply 1 Application topically 2 (two) times a week. 11/11/22  Yes [provider]  levothyroxine (SYNTHROID) 88 MCG tablet Take 88 mcg by mouth every morning. 09/26/21  Yes [provider]  Magnesium 250 MG TABS Take 250 mg by mouth daily.   Yes [provider]  metFORMIN (GLUCOPHAGE-XR) 500 MG 24 hr tablet Take 500 mg by mouth 2 (two) times daily. 10/22/22  Yes [provider]  methocarbamol (ROBAXIN) 500 MG tablet Take 1 tablet (500 mg total) by mouth every 8 (eight) hours as needed for up to 5 days for muscle spasms. 11/25/22 11/30/22 Yes Brooks, Dahari, MD  metoprolol tartrate (LOPRESSOR) 25 MG tablet TAKE 1 TABLET TWICE A DAY. TAKE AN EXTRA DOSE FOR BREAK THROUGH ATRIAL FIBRILLATION WITH INCREASED HEART RATE GREATER THAN 100. 06/03/22  Yes Hochrein, James, MD  nitroGLYCERIN (NITROSTAT) 0.4 MG SL tablet Place 1 tablet (0.4 mg  total) under the tongue every 5 (five) minutes as needed for chest pain. 08/02/21  Yes Hochrein, James, MD  NOVOLIN 70/30 KWIKPEN (70-30) 100 UNIT/ML KwikPen Inject 14-25 Units into the skin See admin instructions. Take 24 mg in the morning 14 mg at lunch and 25 mg a bedtime 03/15/21  Yes [provider]  ondansetron (ZOFRAN) 4 MG tablet Take 1 tablet (4 mg total) by mouth every 8 (eight) hours as needed for nausea or vomiting. 11/25/22  Yes Brooks, Dahari, MD  oxyCODONE-acetaminophen (PERCOCET) 10-325 MG tablet Take 1 tablet by mouth every 6 (six) hours as needed for up to 5 days for pain. 11/25/22 11/30/22 Yes Brooks, Dahari, MD  pantoprazole (PROTONIX) 40 MG tablet Take 1 tablet (40 mg total) by mouth 2 (two) times daily before a meal. 09/30/22  Yes Gessner, Carl E, MD  pregabalin (LYRICA) 50 MG capsule Take 1 capsule (50 mg total) by mouth 2 (two) times daily. 08/08/22  Yes Kuneff, [name], DO  Probiotic Product (PROBIOTIC DAILY PO) Take 1 tablet by mouth daily.   Yes [provider]  Study - VESALIUS - evolocumab (AMG 145) 140 mg/mL or placebo SQ injection (PI-Hilty) Inject 1 mL (140 mg total) into the skin every 14 (fourteen) days. Return kit at next visit.If any questions or concerns occur regarding this medication, please contact [facility]. **For Investigational Drug Use Only** 10/01/22  Yes Hilty, Kenneth C, MD  Accu-Chek Softclix Lancets lancets Use 4 times daily as needed or directed 03/09/20   Ellison, Sean, MD  glucose blood (ACCU-CHEK GUIDE) test strip Use 4 times daily as needed or directed. Patient not taking: Reported on 11/05/2022 03/09/20   Ellison, Sean, MD    Physical Exam: Vitals:   11/25/22 1900 11/25/22 1907 11/25/22 1909 11/25/22 2012  BP: (!) 158/78 (!) 161/82 (!) 154/82 (!) 154/64  Pulse: (!) 104 83 87 65  Resp:    18  Temp:    98.2 F (36.8 C)  TempSrc:    Oral  SpO2: 99% 99% 99% 97%  Weight:      Height:       Physical Exam Constitutional:       Appearance: She is normal weight.  HENT:       Head: Normocephalic.     Nose: Nose normal.     Mouth/Throat:     Mouth: Mucous membranes are moist.     Pharynx: Oropharynx is clear.  Eyes:     Conjunctiva/sclera: Conjunctivae normal.     Pupils: Pupils are equal, round, and reactive to light.  Cardiovascular:     Rate and Rhythm: Normal rate.  Pulmonary:     Effort: Pulmonary effort is normal.     Breath sounds: Normal breath sounds.  Abdominal:     General: Abdomen is flat. Bowel sounds are normal.  Musculoskeletal:        General: Normal range of motion.     Cervical back: Normal range of motion.  Skin:    General: Skin is warm.     Capillary Refill: Capillary refill takes less than 2 seconds.  Neurological:     General: No focal deficit present.     Mental Status: She is alert. Mental status is at baseline.        Labs on Admission: I have personally reviewed the patients's labs and imaging studies.  Assessment/Plan Principal Problem:   S/P cervical spinal fusion   # Paroxysmal atrial fibrillation most likely exacerbated by operative course - Patient had dose of metoprolol held and subsequently developed A-fib - Patient is on Eliquis which has been held  Plan: Resume metoprolol 25 mg twice daily As needed IV metoprolol 5 mg Obtain echocardiogram Will transfer patient to telemetry floor  # CAD status post stenting-patient had nuclear stress test on 08/13/2022 which showed small defect in the apical anterior lateral locations which are reversible with abnormal wall motion consistent with ischemia  Plan: Obtain echocardiogram As needed nitroglycerin Obtain troponin and EKG Continue Imdur  # Hyperlipidemia-continue Zetia, fibrate  Type 2 diabetes-continue sliding scale    Admission status: Observation Telemetry Medical  Certification: The appropriate patient status for this patient is OBSERVATION. Observation status is judged to be reasonable and necessary  in order to provide the required intensity of service to ensure the patient's safety. The patient's presenting symptoms, physical exam findings, and initial radiographic and laboratory data in the context of their medical condition is felt to place them at decreased risk for further clinical deterioration. Furthermore, it is anticipated that the patient will be medically stable for discharge from the hospital within 2 midnights of admission.     Houa Nie MD [facility] If 7PM-7AM, please contact night-coverage [email or web address]  11/25/2022, 9:15 PM     

## 2022-11-26 ENCOUNTER — Encounter (HOSPITAL_COMMUNITY): Payer: Self-pay | Admitting: Orthopedic Surgery

## 2022-11-26 ENCOUNTER — Other Ambulatory Visit: Payer: Self-pay

## 2022-11-26 DIAGNOSIS — Z981 Arthrodesis status: Secondary | ICD-10-CM | POA: Diagnosis not present

## 2022-11-26 DIAGNOSIS — M4722 Other spondylosis with radiculopathy, cervical region: Secondary | ICD-10-CM | POA: Diagnosis not present

## 2022-11-26 LAB — GLUCOSE, CAPILLARY: Glucose-Capillary: 139 mg/dL — ABNORMAL HIGH (ref 70–99)

## 2022-11-26 LAB — TROPONIN I (HIGH SENSITIVITY): Troponin I (High Sensitivity): 3 ng/L (ref <18)

## 2022-11-26 MED ORDER — MAGNESIUM SULFATE 2 GM/50ML IV SOLN
2.0000 g | Freq: Once | INTRAVENOUS | Status: AC
  Administered 2022-11-26: 2 g via INTRAVENOUS
  Filled 2022-11-26: qty 50

## 2022-11-26 NOTE — Progress Notes (Signed)
Discharge instructions given. Patient verbalized understanding and all questions were answered.  ?

## 2022-11-26 NOTE — Evaluation (Signed)
Physical Therapy Evaluation Patient Details Name: [name] [name] MRN: [id number] DOB: [birth year] Today's Date: 11/26/2022  History of Present Illness  Pt is 67 yo female who presents on 11/25/22 for ACDF C5-7. After sx she had chest discomfort and tachycardia. PMH: Afib, CAD, DM2, gastroparesis, HTN, GERD, chronic pain disorder  Clinical Impression  Pt admitted with above diagnosis. Pt from home with her husband and independent despite neck pain. Pt not currently having pain. Pt mobilizing well, educated on precautions and brace wear, handout given. Pt ambulated 300' and ascended/ descended 5 stairs without difficulty. She then became nauseous and light headed and had to sit due to feeling as if she would pass out. HR through even was 76 bpm and in NSR (had been 81 bpm while ambulating). She reports she has episodes like this intermittently at home and has since she was a teenager. Felt better once she was in bed. Educated her husband on staying with her first few days esp during mobility. Recommend f/u with outpt PT when advised by MD.  Pt currently with functional limitations due to the deficits listed below (see PT Problem List). Pt will benefit from acute skilled PT to increase their independence and safety with mobility to allow discharge.           Assistance Recommended at Discharge Intermittent Supervision/Assistance  If plan is discharge home, recommend the following:  Can travel by private vehicle  A little help with bathing/dressing/bathroom;A little help with walking and/or transfers;Assistance with cooking/housework;Assist for transportation;Help with stairs or ramp for entrance        Equipment Recommendations None recommended by PT  Recommendations for Other Services  OT consult    Functional Status Assessment Patient has had a recent decline in their functional status and demonstrates the ability to make significant improvements in function in a reasonable and predictable  amount of time.     Precautions / Restrictions Precautions Precautions: Cervical Precaution Booklet Issued: Yes (comment) Precaution Comments: reviewed precautions, use of brace, posture, and activity level for home Required Braces or Orthoses: Cervical Brace Cervical Brace: Hard collar;Other (comment) (on when OOB) Restrictions Weight Bearing Restrictions: No      Mobility  Bed Mobility Overal bed mobility: Needs Assistance Bed Mobility: Rolling, Sidelying to Sit, Sit to Sidelying Rolling: Supervision Sidelying to sit: Supervision     Sit to sidelying: Supervision General bed mobility comments: pt doing a good job keeping precautions with bed mobility, education given during mvmt    Transfers Overall transfer level: Needs assistance Equipment used: None Transfers: Sit to/from Stand Sit to Stand: Supervision           General transfer comment: supervision for safety. Pt became light headed and nauseous in hallway and given min A at that point to sit in chair and then transfer chair to bed    Ambulation/Gait Ambulation/Gait assistance: Supervision Gait Distance (Feet): 300 Feet Assistive device: None Gait Pattern/deviations: Step-through pattern Gait velocity: decreased Gait velocity interpretation: >2.62 ft/sec, indicative of community ambulatory   General Gait Details: guarded, slowed gait, normal pattern  Stairs Stairs: Yes Stairs assistance: Supervision Stair Management: One rail Right, Alternating pattern, Forwards Number of Stairs: 5 General stair comments: educated on limited downward gaze with cervical brace, pt practiced stairs to get used to this, safe with use of rail  Wheelchair Mobility     Tilt Bed    Modified Rankin (Stroke Patients Only)       Balance Overall balance assessment: No apparent balance deficits (  not formally assessed)                                           Pertinent Vitals/Pain Pain  Assessment Pain Assessment: No/denies pain    Home Living Family/patient expects to be discharged to:: Private residence Living Arrangements: Spouse/significant other Available Help at Discharge: Family;Available 24 hours/day Type of Home: House Home Access: Stairs to enter Entrance Stairs-Rails: Right;Left;Can reach both Entrance Stairs-Number of Steps: 3   Home Layout: One level Home Equipment: Agricultural consultant (2 wheels);Cane - single point;Shower seat - built in;BSC/3in1;Other (comment);Adaptive equipment (bed rail)      Prior Function Prior Level of Function : Independent/Modified Independent;Driving                     Hand Dominance   Dominant Hand: Right    Extremity/Trunk Assessment   Upper Extremity Assessment Upper Extremity Assessment: Defer to OT evaluation    Lower Extremity Assessment Lower Extremity Assessment: Overall WFL for tasks assessed (knee OA but not affecting today)    Cervical / Trunk Assessment Cervical / Trunk Assessment: Neck Surgery  Communication   Communication: No difficulties  Cognition Arousal/Alertness: Awake/alert Behavior During Therapy: WFL for tasks assessed/performed Overall Cognitive Status: Within Functional Limits for tasks assessed                                          General Comments General comments (skin integrity, edema, etc.): pt became light headed and nauseous in hallway and said she felt like she could pass out. This happens to her intermittently even before this surgery. Pt assisted into chair and wheeled back to room. HR 76 bpm at the time (was 80 bpm during ambulation), normal sinus rhythm. Episode passed once lying down    Exercises     Assessment/Plan    PT Assessment Patient needs continued PT services  PT Problem List Decreased mobility;Decreased knowledge of precautions;Decreased activity tolerance       PT Treatment Interventions Gait training;Functional mobility  training;Therapeutic activities;Stair training;Therapeutic exercise;Patient/family education    PT Goals (Current goals can be found in the Care Plan section)  Acute Rehab PT Goals Patient Stated Goal: return home PT Goal Formulation: With patient/family Time For Goal Achievement: 12/03/22 Potential to Achieve Goals: Good    Frequency Min 5X/week     Co-evaluation               AM-PAC PT "6 Clicks" Mobility  Outcome Measure Help needed turning from your back to your side while in a flat bed without using bedrails?: None Help needed moving from lying on your back to sitting on the side of a flat bed without using bedrails?: None Help needed moving to and from a bed to a chair (including a wheelchair)?: None Help needed standing up from a chair using your arms (e.g., wheelchair or bedside chair)?: None Help needed to walk in hospital room?: A Little Help needed climbing 3-5 steps with a railing? : A Little 6 Click Score: 22    End of Session Equipment Utilized During Treatment: Cervical collar Activity Tolerance: Treatment limited secondary to medical complications (Comment) (light headed) Patient left: in bed;with call bell/phone within reach;with family/visitor present Nurse Communication: Mobility status PT Visit Diagnosis: Other abnormalities of gait  and mobility (R26.89)    Time: 1610-9604 PT Time Calculation (min) (ACUTE ONLY): 37 min   Charges:   PT Evaluation $PT Eval Moderate Complexity: 1 Mod PT Treatments $Gait Training: 8-22 mins PT General Charges $$ ACUTE PT VISIT: 1 Visit         Lyanne Co, PT  Acute Rehab Services Secure chat preferred Office [phone]   Lawana Chambers Cordella Nyquist 11/26/2022, 9:57 AM

## 2022-11-26 NOTE — Progress Notes (Signed)
  Progress Note   Patient: [name] [name] ZOX:[id number] DOB: 1956-02-07 DOA: 11/25/2022     0 DOS: the patient was seen and examined on 11/26/2022 at 9:15AM      Brief hospital course: [name] [name] is a 67 y.o. F with CAD, DM, IDA, HTN and pAF on Eliquis who presented for ACDF, developed post-op symptomatic Afib.     Assessment and Plan: Atrial fibrillation, paroxysmal Transient, resolved overnight.  In sinus this morning. - Continue metoprolol - CHA2DS2-Vasc 4, would resume Eliquis as soon as safe from Orthopedic perspective, no bridging needed   Coronary artery disease Recent nuclear stress with small area of ischemia.  Had some nonspecific chest discomfort last night with Afib, resolved this morning.  In absence of new ECG changes or detectable troponin, ACS exceedingly unlikely, do not think echo is necessary.   - Resume home Zetia, fibrate, Imdur, BB, evolucumab   Hypomagnesemia - Supplement Mag, resume home mag at discharge   Diabetes Glucoses well controlled - Resume home nisulin at discharge        Subjective: Felt dizzy with PT, but this is normal.  HR maintained in sinus rhythm, no CP or dyspnea.  Overall feeling well.     Physical Exam: BP (!) 153/64 (BP Location: Right Arm)   Pulse 69   Temp 98.9 F (37.2 C) (Oral)   Resp 16   Ht 5' 5.5" (1.664 m)   Wt 83.9 kg   SpO2 96%   BMI 30.32 kg/m   Adult female, lying in bed, interactive and appopriate RRR no murmurs no LE edema RR normal, lungs clear Attention normal, affect normal, moves all extremities with normal strength and corrindation, speech fluent, oriented x4.    Data Reviewed: BMP unremarkable Magnesium low CBC mild anemia, normocytic  Family Communication: Husband    Disposition: Status is: Observation From standpoint of Afib and coronary disease, stable, no further hospital work up needed, recommend resumption of home medicines        Author: Alberteen Sam,  MD 11/26/2022 9:25 AM  For on call review [email or web address].

## 2022-11-26 NOTE — Care Management Obs Status (Signed)
MEDICARE OBSERVATION STATUS NOTIFICATION   Patient Details  Name: [name] [name] MRN: [id number] Date of Birth: 11-30-1955   Medicare Observation Status Notification Given:  Yes    Kermit Balo, RN 11/26/2022, 1:46 PM

## 2022-11-26 NOTE — TOC Transition Note (Signed)
Transition of Care [facility]) - CM/SW Discharge Note   Patient Details  Name: [name] MRN: [id number] Date of Birth: [birth year]  Transition of Care [facility]) CM/SW Contact:  Kermit Balo, RN Phone Number: 11/26/2022, 1:49 PM   Clinical Narrative:    Pt is discharging home with self care. No f/u  per therapies.  Spouse at the bedside to provide transport home and supervision at home.   Final next level of care: Home/Self Care Barriers to Discharge: No Barriers Identified   Patient Goals and CMS Choice      Discharge Placement                         Discharge Plan and Services Additional resources added to the After Visit Summary for                                       Social Determinants of Health (SDOH) Interventions SDOH Screenings   Food Insecurity: No Food Insecurity (11/26/2022)  Housing: Low Risk  (11/26/2022)  Transportation Needs: No Transportation Needs (11/26/2022)  Utilities: Not At Risk (11/26/2022)  Alcohol Screen: Low Risk  (04/11/2022)  Depression (PHQ2-9): Low Risk  (04/11/2022)  Financial Resource Strain: Low Risk  (11/01/2022)  Physical Activity: Unknown (11/01/2022)  Social Connections: Moderately Isolated (11/01/2022)  Stress: No Stress Concern Present (11/01/2022)  Tobacco Use: Low Risk  (11/26/2022)     Readmission Risk Interventions     No data to display

## 2022-11-26 NOTE — Progress Notes (Signed)
Pt arrived to unit via WC form 2C, pt A&O x4, denies surgical and chest pain, pt ambulated to BR, gait steady, voided without difficulty, C-collar in place, off when in bed, honeycomb dressing in place, anterior neck, C/D/I, no signs of distress, call bell in reach

## 2022-11-26 NOTE — Evaluation (Signed)
Occupational Therapy Evaluation Patient Details Name: [name] [name] MRN: [id number] DOB: 1955/10/20 Today's Date: 11/26/2022   History of Present Illness Pt is 67 yo female who presents on 11/25/22 for ACDF C5-7. After sx she had chest discomfort and tachycardia. PMH: Afib, CAD, DM2, gastroparesis, HTN, GERD, chronic pain disorder   Clinical Impression   Patient evaluated by Occupational Therapy with no further acute OT needs identified. All education has been completed and the patient has no further questions. See below for any follow-up Occupational Therapy or equipment needs. OT to sign off. Thank you for referral.         Recommendations for follow up therapy are one component of a multi-disciplinary discharge planning process, led by the attending physician.  Recommendations may be updated based on patient status, additional functional criteria and insurance authorization.   Assistance Recommended at Discharge None  Patient can return home with the following Assist for transportation    Functional Status Assessment  Patient has had a recent decline in their functional status and demonstrates the ability to make significant improvements in function in a reasonable and predictable amount of time.  Equipment Recommendations  None recommended by OT    Recommendations for Other Services       Precautions / Restrictions Precautions Precautions: Cervical Precaution Booklet Issued: Yes (comment) Precaution Comments: reviewed precautions, use of brace, posture, and activity level for home Required Braces or Orthoses: Cervical Brace Cervical Brace: Hard collar;At all times (pt with brace off supine in bed- HOB elevated to 39 degrees) Restrictions Weight Bearing Restrictions: No      Mobility Bed Mobility Overal bed mobility: Needs Assistance Bed Mobility: Rolling, Supine to Sit, Sit to Supine Rolling: Supervision Sidelying to sit: Supervision     Sit to sidelying:  Supervision General bed mobility comments: demonstrates exiting on R side to simulate home with HOB 19 degrees. pt reports wanting to change sides to exit on L side at home to spouse during session.    Transfers Overall transfer level: Needs assistance   Transfers: Sit to/from Stand Sit to Stand: Supervision                  Balance                                           ADL either performed or assessed with clinical judgement   ADL Overall ADL's : Needs assistance/impaired Eating/Feeding: Modified independent   Grooming: Wash/dry face   Upper Body Bathing: Min guard   Lower Body Bathing: Min guard   Upper Body Dressing : Min guard Upper Body Dressing Details (indicate cue type and reason): pt requires mod (A) to don doff brace due to reach with R UE. pt will have (A) of spouise Lower Body Dressing: Min guard pt was able to reach feet in figure 4   Toilet Transfer: Min guard           Functional mobility during ADLs: Min guard  Cervical precautions ( handout provided): Educated patient on don doff brace with return demonstration, educated on oral care using cups, washing face with cloth, never to wash directly on incision site, avoid neck rotation flexion and extension, positioning with pillows in chair for bil UE, sleeping positioning, avoiding pushing / pulling with bil UE.  Pt educated on need to notify doctor / RN of swallowing changes or choking.  Vision Baseline Vision/History: 1 Wears glasses Ability to See in Adequate Light: 0 Adequate Additional Comments: reading glasses     Perception     Praxis      Pertinent Vitals/Pain Pain Assessment Pain Assessment: No/denies pain     Hand Dominance Right   Extremity/Trunk Assessment Upper Extremity Assessment Upper Extremity Assessment: Overall WFL for tasks assessed   Lower Extremity Assessment Lower Extremity Assessment: Overall WFL for tasks assessed   Cervical / Trunk  Assessment Cervical / Trunk Assessment: Neck Surgery   Communication Communication Communication: No difficulties   Cognition Arousal/Alertness: Awake/alert Behavior During Therapy: WFL for tasks assessed/performed Overall Cognitive Status: Within Functional Limits for tasks assessed                                       General Comments  bandage intact and dry at this time. Educated on don doff of brace and placement of brace for transfers to make sure pt places brace on    Exercises     Shoulder Instructions      Home Living Family/patient expects to be discharged to:: Private residence Living Arrangements: Spouse/significant other Available Help at Discharge: Family;Available 24 hours/day Type of Home: House Home Access: Stairs to enter [employer] of Steps: 3 Entrance Stairs-Rails: Right;Left;Can reach both Home Layout: One level     Bathroom Shower/Tub: Producer, television/film/video: Handicapped height     Home Equipment: Agricultural consultant (2 wheels);Cane - single point;Shower seat - built in;BSC/3in1;Other (comment);Adaptive equipment (bed rail , reacher,) Adaptive Equipment: Reacher Additional Comments: cat - Red (indoor/ outdoor)      Prior Functioning/Environment Prior Level of Function : Independent/Modified Independent;Driving                        OT Problem List:        OT Treatment/Interventions:      OT Goals(Current goals can be found in the care plan section)    OT Frequency: Min 2X/week    Co-evaluation              AM-PAC OT "6 Clicks" Daily Activity     Outcome Measure Help from another person eating meals?: None Help from another person taking care of personal grooming?: None Help from another person toileting, which includes using toliet, bedpan, or urinal?: None Help from another person bathing (including washing, rinsing, drying)?: A Little Help from another person to put on and taking off  regular upper body clothing?: None Help from another person to put on and taking off regular lower body clothing?: None 6 Click Score: 23   End of Session Equipment Utilized During Treatment: Cervical collar Nurse Communication: Mobility status;Precautions  Activity Tolerance: Patient tolerated treatment well Patient left: in bed;with call bell/phone within reach;with family/visitor present  OT Visit Diagnosis: Unsteadiness on feet (R26.81)                Time: 1610-9604 OT Time Calculation (min): 22 min Charges:  OT General Charges $OT Visit: 1 Visit OT Evaluation $OT Eval Moderate Complexity: 1 Mod   Brynn, OTR/L  Acute Rehabilitation Services Office: [phone] .   Mateo Flow 11/26/2022, 12:17 PM

## 2022-11-26 NOTE — Discharge Summary (Signed)
Patient ID: [name] [name] MRN: [id number] DOB/AGE: May 23, 1956 67 y.o.  Admit date: 11/25/2022 Discharge date: 11/26/2022  Admission Diagnoses:  Principal Problem:   S/P cervical spinal fusion Active Problems:   Hypothyroidism   Hyperlipidemia LDL goal <70   Essential hypertension   CAD S/P percutaneous coronary angioplasty   PAF (paroxysmal atrial fibrillation) (HCC)   Hypomagnesemia   Discharge Diagnoses:  Principal Problem:   S/P cervical spinal fusion Active Problems:   Hypothyroidism   Hyperlipidemia LDL goal <70   Essential hypertension   CAD S/P percutaneous coronary angioplasty   PAF (paroxysmal atrial fibrillation) (HCC)   Hypomagnesemia  status post Procedure(s): ANTERIOR CERVICAL DISCECTOMY AND FUSION CERVICAL FIVE TO SEVEN  Past Medical History:  Diagnosis Date   Accelerating angina (HCC) 11/04/2017   Allergy    Asthma    Atrial fibrillation (HCC)    Cataract    Chronic pain disorder    neck and shoulder   Chronic pain of right upper extremity 11/10/2017   Coronary artery disease 2019   Stent x2   CTS (carpal tunnel syndrome) 01/27/2013   right   Diabetes mellitus type II    Diabetic gastroparesis (HCC)    Dysrhythmia    A. Fib   Esophageal reflux 08/25/2013   [facility] [place], Dr Vonda Antigua   FATTY LIVER DISEASE 04/27/2007   Qualifier: Diagnosis of  By: Terrilee Croak CMA, Darlene     Focal muscle atrophy 09/26/2015   Fundic gland polyps of stomach, benign    Globus sensation 04/13/2018   Hordeolum externum of right upper eyelid 12/24/2021   HTN (hypertension)    Hx of adenomatous colonic polyps 12/10/2021   Hyperlipidemia, mixed 05/04/2007   Qualifier: Diagnosis of  By: Nena Jordan crestor caused myalgias even at low dose Livalo caused myalgias Lipitor  Mother with severe reaction myalgias Simvastatin, Welchol    Hypokalemia 05/25/2013   Improved stopping HCTZ. Was noted to have an elevated glucose when K was low. Recheck renal next week  after starting Maxzide   Hypomagnesemia 08/10/2014   Hypothyroidism    Iron deficiency anemia 08/07/2018   Iron malabsorption 08/07/2018   Laryngitis 08/25/2017   Leg cramps 04/15/2016   Nausea without vomiting 08/08/2014   NONSPEC ELEVATION OF LEVELS OF TRANSAMINASE/LDH 05/01/2007   Qualifier: Diagnosis of  By: Francesca Jewett     Osteoarthritis    chronic, right knee (11/04/2017)   Plantar fasciitis    Pneumonia    Rosacea 10/22/2016   Urine incontinence     Surgeries: Procedure(s): ANTERIOR CERVICAL DISCECTOMY AND FUSION CERVICAL FIVE TO SEVEN on 11/25/2022   Consultants: Treatment Team:  Alberteen Sam, MD  Discharged Condition: Improved  Hospital Course: [name] [name] is an 67 y.o. female who was admitted 11/25/2022 for operative treatment of S/P cervical spinal fusion. Patient failed conservative treatments (please see the history and physical for the specifics) and had severe unremitting pain that affects sleep, daily activities and work/hobbies. After pre-op clearance, the patient was taken to the operating room on 11/25/2022 and underwent  Procedure(s): ANTERIOR CERVICAL DISCECTOMY AND FUSION CERVICAL FIVE TO SEVEN.    Patient was given perioperative antibiotics:  Anti-infectives (From admission, onward)    Start     Dose/Rate Route Frequency Ordered Stop   11/25/22 1600  ceFAZolin (ANCEF) IVPB 1 g/50 mL premix        1 g 100 mL/hr over 30 Minutes Intravenous Every 8 hours 11/25/22 1142 11/25/22 2249   11/25/22 4098  ceFAZolin (ANCEF) IVPB 2g/100 mL premix        2 g 200 mL/hr over 30 Minutes Intravenous 30 min pre-op 11/25/22 0639 11/25/22 0748   11/25/22 0611  ceFAZolin (ANCEF) 2-4 GM/100ML-% IVPB       Note to Pharmacy: Gleason, Ginger E: cabinet override      11/25/22 1610 11/25/22 0800        Patient was given sequential compression devices and early ambulation to prevent DVT.   Patient benefited maximally from hospital stay and there were no surgical  complications. At the time of discharge, the patient was urinating/moving their bowels without difficulty, tolerating a regular diet, pain is controlled with oral pain medications and they have been cleared by PT/OT.   Hospital course was notable for episode of A-fib resulting in the need for a hospitalist consultation.  Patient had 2 episodes of tachycardia, generalized weakness, and a sensation of chest discomfort.  IV dose of Lopressor ultimately resolved the episode and she states she has been having no further A-fib events.  Patient was transferred from the spine floor to 3 W. on telemetry.  Prior to discharge she was evaluated by the hospitalist team and cleared.  Recent vital signs: Patient Vitals for the past 24 hrs:  BP Temp Temp src Pulse Resp SpO2  11/26/22 0729 (!) 153/64 98.9 F (37.2 C) Oral 69 16 96 %  11/26/22 0439 (!) 151/62 98.3 F (36.8 C) Oral 65 16 96 %  11/26/22 0055 (!) 175/72 98.2 F (36.8 C) Oral 67 18 96 %  11/25/22 2218 (!) 165/71 98.3 F (36.8 C) Oral 66 17 100 %  11/25/22 2012 (!) 154/64 98.2 F (36.8 C) Oral 65 18 97 %  11/25/22 1909 (!) 154/82 -- -- 87 -- 99 %  11/25/22 1907 (!) 161/82 -- -- 83 -- 99 %  11/25/22 1900 (!) 158/78 -- -- (!) 104 -- 99 %  11/25/22 1844 (!) 165/76 97.6 F (36.4 C) Oral (!) 102 18 99 %  11/25/22 1818 (!) 181/76 97.7 F (36.5 C) Oral 87 18 99 %  11/25/22 1712 (!) 160/72 -- -- 62 18 97 %  11/25/22 1654 (!) 186/84 -- -- 96 17 100 %  11/25/22 1615 (!) 148/68 97.7 F (36.5 C) Oral 64 18 97 %  11/25/22 1202 (!) 145/60 97.7 F (36.5 C) Oral 66 18 94 %  11/25/22 1130 131/65 98.6 F (37 C) -- 79 13 94 %  11/25/22 1115 133/60 -- -- 78 12 93 %  11/25/22 1100 129/62 98.5 F (36.9 C) -- 88 12 92 %     Recent laboratory studies:  Recent Labs    11/25/22 2155  WBC 9.6  HGB 11.3*  HCT 34.9*  PLT 232  NA 141  K 4.1  CL 103  CO2 27  BUN 11  CREATININE 0.99  GLUCOSE 177*  CALCIUM 9.2     Discharge Medications:   Allergies  as of 11/26/2022       Reactions   Epinephrine Other (See Comments)   Pass out    Other    Other reaction(s): Myalgia   Statins Other (See Comments)   Myalgias: Simvastatin also caused back ache   Welchol [colesevelam Hcl]    myalgia   Amoxicillin Itching, Rash   Has patient had a PCN reaction causing immediate rash, facial/tongue/throat swelling, SOB or lightheadedness with hypotension: No Has patient had a PCN reaction causing severe rash involving mucus membranes or skin necrosis: No Has patient had  a PCN reaction that required hospitalization: No Has patient had a PCN reaction occurring within the last 10 years: Yes If all of the above answers are "NO", then may proceed with Cephalosporin use.        Medication List     STOP taking these medications    Accu-Chek Guide test strip Generic drug: glucose blood   Vitamin B 12 500 MCG Tabs       TAKE these medications    Accu-Chek Softclix Lancets lancets Use 4 times daily as needed or directed   CALTRATE 600 PO Take 650 mg by mouth daily.   Eliquis 5 MG Tabs tablet Generic drug: apixaban TAKE 1 TABLET TWICE A DAY   ezetimibe 10 MG tablet Commonly known as: ZETIA Take 10 mg by mouth daily.   fenofibrate 160 MG tablet Take 1 tablet (160 mg total) by mouth daily.   fexofenadine 180 MG tablet Commonly known as: ALLEGRA Take 180 mg by mouth daily.   isosorbide mononitrate 30 MG 24 hr tablet Commonly known as: IMDUR Take 1 tablet (30 mg total) by mouth daily.   ketoconazole 2 % shampoo Commonly known as: NIZORAL Apply 1 Application topically 2 (two) times a week.   levothyroxine 88 MCG tablet Commonly known as: SYNTHROID Take 88 mcg by mouth every morning.   Magnesium 250 MG Tabs Take 250 mg by mouth daily.   metFORMIN 500 MG 24 hr tablet Commonly known as: GLUCOPHAGE-XR Take 500 mg by mouth 2 (two) times daily.   methocarbamol 500 MG tablet Commonly known as: ROBAXIN Take 1 tablet (500 mg total)  by mouth every 8 (eight) hours as needed for up to 5 days for muscle spasms.   metoprolol tartrate 25 MG tablet Commonly known as: LOPRESSOR TAKE 1 TABLET TWICE A DAY. TAKE AN EXTRA DOSE FOR BREAK THROUGH ATRIAL FIBRILLATION WITH INCREASED HEART RATE GREATER THAN 100.   nitroGLYCERIN 0.4 MG SL tablet Commonly known as: NITROSTAT Place 1 tablet (0.4 mg total) under the tongue every 5 (five) minutes as needed for chest pain.   NovoLIN 70/30 Kwikpen (70-30) 100 UNIT/ML KwikPen Generic drug: insulin isophane & regular human KwikPen Inject 14-25 Units into the skin See admin instructions. Take 24 mg in the morning 14 mg at lunch and 25 mg a bedtime   ondansetron 4 MG tablet Commonly known as: Zofran Take 1 tablet (4 mg total) by mouth every 8 (eight) hours as needed for nausea or vomiting.   oxyCODONE-acetaminophen 10-325 MG tablet Commonly known as: Percocet Take 1 tablet by mouth every 6 (six) hours as needed for up to 5 days for pain.   pantoprazole 40 MG tablet Commonly known as: PROTONIX Take 1 tablet (40 mg total) by mouth 2 (two) times daily before a meal.   pregabalin 50 MG capsule Commonly known as: LYRICA Take 1 capsule (50 mg total) by mouth 2 (two) times daily.   PROBIOTIC DAILY PO Take 1 tablet by mouth daily.   Refresh Optive Mega-3 0.5-1-0.5 % Soln Generic drug: Carboxymeth-Glyc-Polysorb PF Place 1 drop into both eyes 3 (three) times daily.   VESALIUS evolocumab (AMG 145) or placebo 140 mg/mL SQ injection Inject 1 mL (140 mg total) into the skin every 14 (fourteen) days. Return kit at next visit.If any questions or concerns occur regarding this medication, please contact [facility]. **For Investigational Drug Use Only**        Diagnostic Studies: DG Cervical Spine 2 or 3 views  Result Date: 11/25/2022 CLINICAL DATA:  Fluoroscopic assistance for anterior cervical fusion EXAM: CERVICAL SPINE - 2-3 VIEW COMPARISON:  None Available. FINDINGS: Fluoroscopic  images show anterior cervical fusion from C5-C7 levels. Fluoroscopy time 1 minute 15 seconds. Radiation dose 20.17 mGy. IMPRESSION: Fluoroscopic assistance was provided for surgical fusion at C5-C6 and C6-C7 levels. Electronically Signed   By: Ernie Avena M.D.   On: 11/25/2022 11:20   DG C-Arm 1-60 Min-No Report  Result Date: 11/25/2022 Fluoroscopy was utilized by the requesting physician.  No radiographic interpretation.   DG C-Arm 1-60 Min-No Report  Result Date: 11/25/2022 Fluoroscopy was utilized by the requesting physician.  No radiographic interpretation.   DG C-Arm 1-60 Min-No Report  Result Date: 11/25/2022 Fluoroscopy was utilized by the requesting physician.  No radiographic interpretation.    Discharge Instructions     Incentive spirometry RT   Complete by: As directed         Follow-up Information     Venita Lick, MD. Schedule an appointment as soon as possible for a visit in 2 week(s).   Specialty: Orthopedic Surgery Why: If symptoms worsen, For suture removal, For wound re-check Contact information: [address] [place] [address] [phone]                 Discharge Plan:  discharge to home  Disposition: [name] [name] is a very pleasant 67 year old woman who underwent a two-level ACDF for cervical spondylitic radiculopathy.  Postoperatively she was noted to have episodes of A-fib that ultimately required transfer to a telemetry bed.  Symptoms resolved with IV Lopressor and restarting her twice daily oral metoprolol.  Prior to discharge she was having no shortness of breath or chest discomfort and no tachycardia.  Patient was evaluated by the hospitalist team and cleared for discharge.  Neurologically she remained stable.  She has no significant neck pain and the preoperative radicular arm pain has resolved.  Overall she is stable and doing quite well.  Patient was given appropriate instructions.  She will restart her Eliquis tomorrow  and if any bleeding issues arise she knows to contact my office.  Medications for pain control, muscle spasms, and nausea were provided electronically.  Patient will follow-up with me in 2 weeks.  I have encouraged her to contact her cardiologist to set up an appropriate follow-up for reevaluation.    Signed: Alvy Beal for Dr. Venita Lick [facility] [phone] 11/26/2022, 7:55 AM

## 2022-11-26 NOTE — Progress Notes (Signed)
    Subjective: Procedure(s) (LRB): ANTERIOR CERVICAL DISCECTOMY AND FUSION CERVICAL FIVE TO SEVEN (N/A) 1 Day Post-Op  Patient reports pain as 1 on 0-10 scale.  Reports decreased arm pain Reports minimal incisional neck pain   Positive void Negative bowel movement Positive flatus Negative chest pain or shortness of breath  Objective: Vital signs in last 24 hours: Temp:  [97.6 F (36.4 C)-98.9 F (37.2 C)] 98.9 F (37.2 C) (07/02 0729) Pulse Rate:  [62-104] 69 (07/02 0729) Resp:  [12-18] 16 (07/02 0729) BP: (129-186)/(60-84) 153/64 (07/02 0729) SpO2:  [92 %-100 %] 96 % (07/02 0729)  Intake/Output from previous day: 07/01 0701 - 07/02 0700 In: 659.7 [P.O.:340; I.V.:219.7; IV Piggyback:100] Out: 25 [Blood:25]  Labs: Recent Labs    11/25/22 2155  WBC 9.6  RBC 4.16  HCT 34.9*  PLT 232   Recent Labs    11/25/22 2155  NA 141  K 4.1  CL 103  CO2 27  BUN 11  CREATININE 0.99  GLUCOSE 177*  CALCIUM 9.2   No results for input(s): "LABPT", "INR" in the last 72 hours.  Physical Exam: Neurologically intact Sensation intact distally Dorsiflexion/Plantar flexion intact Incision: dressing C/D/I Compartment soft Body mass index is 30.32 kg/m.  Assessment/Plan: Patient stable  Mobilization with physical therapy Encourage incentive spirometry Continue care  Patient denies any further episodes of A-fib.  She has no chest pain at present.  He has done well since the IV Lopressor was having yesterday at approximately 7 PM. Await reevaluation by hospitalist team.  If no issues and more than likely will discharge this afternoon to home. Medications have been electronically sent and includes Percocet, Zofran, and Robaxin. Patient was instructed to contact her cardiologist for follow-up given the episode of A-fib following surgery. She will restart Eliquis tomorrow.  This will be a total of 48 hours postsurgery.  She knows to contact my office if there is any bleeding,  hematoma formation, or difficulty breathing/swallowing. Discharge instructions have been provided.   Venita Lick, MD [facility] [phone]

## 2022-12-25 ENCOUNTER — Encounter (INDEPENDENT_AMBULATORY_CARE_PROVIDER_SITE_OTHER): Payer: Self-pay

## 2022-12-28 ENCOUNTER — Other Ambulatory Visit: Payer: Self-pay | Admitting: Cardiology

## 2023-01-20 ENCOUNTER — Encounter: Payer: Medicare Other | Admitting: *Deleted

## 2023-01-20 DIAGNOSIS — Z006 Encounter for examination for normal comparison and control in clinical research program: Secondary | ICD-10-CM

## 2023-01-20 NOTE — Research (Signed)
Patient came in today for her vesalius drug She had one injection left  According to patient last time she had an extra one and took the one out of her old box that was left over. Instead of being off track she will come back in two week when she needs an injection so she will go home with only 7 and come back with none.   Today I did collect her used IP -  3 - [id number] 4 - [id number]  She took her lost IP of [id number] home with her to give herself injection.   She will come back in 2 weeks for new IP.  02-04-2023

## 2023-01-23 ENCOUNTER — Ambulatory Visit: Payer: Medicare Other | Admitting: Family Medicine

## 2023-01-29 LAB — LIPID PANEL
Cholesterol: 176 (ref 0–200)
HDL: 50 (ref 35–70)
LDL Cholesterol: 105
Triglycerides: 120 (ref 40–160)

## 2023-01-29 LAB — BASIC METABOLIC PANEL
BUN: 18 (ref 4–21)
CO2: 24 — AB (ref 13–22)
Chloride: 103 (ref 99–108)
Creatinine: 1 (ref 0.5–1.1)
Glucose: 155
Potassium: 4.9 meq/L (ref 3.5–5.1)
Sodium: 142 (ref 137–147)

## 2023-01-29 LAB — HEPATIC FUNCTION PANEL
ALT: 19 U/L (ref 7–35)
AST: 18 (ref 13–35)
Alkaline Phosphatase: 59 (ref 25–125)
Bilirubin, Total: 0.4

## 2023-01-29 LAB — HEMOGLOBIN A1C: Hemoglobin A1C: 6.7

## 2023-01-29 LAB — COMPREHENSIVE METABOLIC PANEL
Albumin: 4.4 (ref 3.5–5.0)
Calcium: 10 (ref 8.7–10.7)
Globulin: 2.6
eGFR: 61

## 2023-01-29 LAB — TSH: TSH: 3.83 (ref 0.41–5.90)

## 2023-01-30 ENCOUNTER — Ambulatory Visit: Payer: Medicare Other | Admitting: Cardiology

## 2023-02-04 ENCOUNTER — Encounter: Payer: Medicare Other | Admitting: *Deleted

## 2023-02-04 DIAGNOSIS — Z006 Encounter for examination for normal comparison and control in clinical research program: Secondary | ICD-10-CM

## 2023-02-04 MED ORDER — STUDY - VESALIUS - EVOLOCUMAB (AMG 145) 140 MG/ML OR PLACEBO SQ INJECTION (PI-HILTY)
140.0000 mg | INJECTION | SUBCUTANEOUS | Status: AC
  Administered 2023-02-04: 140 mg via SUBCUTANEOUS
  Filled 2023-02-04: qty 1

## 2023-02-04 MED ORDER — STUDY - VESALIUS - EVOLOCUMAB (AMG 145) 140 MG/ML OR PLACEBO SQ INJECTION (PI-HILTY)
140.0000 mg | INJECTION | SUBCUTANEOUS | 0 refills | Status: DC
Start: 2023-02-04 — End: 2023-05-13

## 2023-02-04 NOTE — Research (Unsigned)
Vesailus week 144  Forgot to bring injection from last visit.  Last IP was 01/21/2023   No chest pains or shortness of breath noted  No med changes  July 1st had cervical infusion - only stayed one night 11/25/2022  Vitals: [x]  Experience any AE/SAE/Hospitalizations [x]   If yes please explain:   Labs drawn: 0959  Non-Fatal Potential Endpoint Assessment Yes  No   Has the subject experienced/undergone any of the following since the last visit/contact?   []   [x]    Any Coronary Artery Revascularization/Cerebrovascular Revascularization/ Peripheral Artery Revascularization/Amputation Procedure   []   [x]    Myocardial Infarction []   [x]    Stroke   []   [x]    Provide the date for the non-fatal Potential Endpoints status:   []   [x]    IP admin please see MAR (please add Box # to comment section on [facility]) [x]   Patient given Box # [id number] Box #[id number]  IP injection by patient LUA @ 1032   Current Outpatient Medications:    Calcium Carbonate (CALTRATE 600 PO), Take 650 mg by mouth daily., Disp: , Rfl:    Carboxymeth-Glyc-Polysorb PF (REFRESH OPTIVE MEGA-3) 0.5-1-0.5 % SOLN, Place 1 drop into both eyes 3 (three) times daily., Disp: , Rfl:    ELIQUIS 5 MG TABS tablet, TAKE 1 TABLET TWICE A DAY, Disp: 180 tablet, Rfl: 1   ezetimibe (ZETIA) 10 MG tablet, Take 10 mg by mouth daily., Disp: , Rfl:    fenofibrate 160 MG tablet, TAKE 1 TABLET DAILY, Disp: 90 tablet, Rfl: 1   fexofenadine (ALLEGRA) 180 MG tablet, Take 180 mg by mouth daily., Disp: , Rfl:    isosorbide mononitrate (IMDUR) 30 MG 24 hr tablet, Take 1 tablet (30 mg total) by mouth daily., Disp: 90 tablet, Rfl: 3   ketoconazole (NIZORAL) 2 % shampoo, Apply 1 Application topically 2 (two) times a week., Disp: , Rfl:    levothyroxine (SYNTHROID) 88 MCG tablet, Take 88 mcg by mouth every morning., Disp: , Rfl:    Magnesium 250 MG TABS, Take 250 mg by mouth daily., Disp: , Rfl:    metFORMIN (GLUCOPHAGE-XR) 500 MG 24 hr tablet, Take  500 mg by mouth 2 (two) times daily., Disp: , Rfl:    metoprolol tartrate (LOPRESSOR) 25 MG tablet, TAKE 1 TABLET TWICE A DAY. TAKE AN EXTRA DOSE FOR BREAK THROUGH ATRIAL FIBRILLATION WITH INCREASED HEART RATE GREATER THAN 100., Disp: 180 tablet, Rfl: 3   nitroGLYCERIN (NITROSTAT) 0.4 MG SL tablet, Place 1 tablet (0.4 mg total) under the tongue every 5 (five) minutes as needed for chest pain., Disp: 25 tablet, Rfl: PRN   NOVOLIN 70/30 KWIKPEN (70-30) 100 UNIT/ML KwikPen, Inject 14-25 Units into the skin See admin instructions. Take 24 mg in the morning 14 mg at lunch and 25 mg a bedtime, Disp: , Rfl:    pantoprazole (PROTONIX) 40 MG tablet, Take 1 tablet (40 mg total) by mouth 2 (two) times daily before a meal., Disp: 180 tablet, Rfl: 3   pregabalin (LYRICA) 50 MG capsule, Take 1 capsule (50 mg total) by mouth 2 (two) times daily., Disp: 60 capsule, Rfl: 5   Probiotic Product (PROBIOTIC DAILY PO), Take 1 tablet by mouth daily., Disp: , Rfl:    Accu-Chek Softclix Lancets lancets, Use 4 times daily as needed or directed, Disp: 360 each, Rfl: 3   Study - VESALIUS - evolocumab (AMG 145) 140 mg/mL or placebo SQ injection (PI-Hilty), Inject 1 mL (140 mg total) into the skin every 14 (fourteen) days.  Return kit at next visit.If any questions or concerns occur regarding this medication, please contact [facility]. **For Investigational Drug Use Only**, Disp: 8 mL, Rfl: 0

## 2023-02-05 LAB — HM DEXA SCAN

## 2023-02-07 ENCOUNTER — Ambulatory Visit (INDEPENDENT_AMBULATORY_CARE_PROVIDER_SITE_OTHER): Payer: Medicare Other | Admitting: Family Medicine

## 2023-02-07 ENCOUNTER — Encounter: Payer: Self-pay | Admitting: Family Medicine

## 2023-02-07 VITALS — BP 121/72 | HR 60 | Temp 97.9°F | Wt 185.6 lb

## 2023-02-07 DIAGNOSIS — R2990 Unspecified symptoms and signs involving the nervous system: Secondary | ICD-10-CM | POA: Diagnosis not present

## 2023-02-07 DIAGNOSIS — M546 Pain in thoracic spine: Secondary | ICD-10-CM | POA: Diagnosis not present

## 2023-02-07 DIAGNOSIS — M5134 Other intervertebral disc degeneration, thoracic region: Secondary | ICD-10-CM

## 2023-02-07 DIAGNOSIS — M5414 Radiculopathy, thoracic region: Secondary | ICD-10-CM | POA: Diagnosis not present

## 2023-02-07 DIAGNOSIS — Z981 Arthrodesis status: Secondary | ICD-10-CM

## 2023-02-07 DIAGNOSIS — R202 Paresthesia of skin: Secondary | ICD-10-CM | POA: Diagnosis not present

## 2023-02-07 MED ORDER — PREGABALIN 50 MG PO CAPS: 50.0000 mg | ORAL_CAPSULE | Freq: Two times a day (BID) | ORAL | 1 refills | Status: DC

## 2023-02-07 NOTE — Patient Instructions (Signed)

## 2023-02-07 NOTE — Progress Notes (Signed)
[name] [name] , [birth year], 67 y.o., female MRN: [id number] Patient Care Team    Relationship Specialty Notifications Start End  Natalia Leatherwood, DO PCP - General Family Medicine  06/03/18   Rollene Rotunda, MD PCP - Cardiology Cardiology Admissions 11/14/17   Iva Boop, MD Consulting Physician Gastroenterology  06/05/18   Chilton Greathouse, MD Consulting Physician Pulmonary Disease  06/05/18   Dorisann Frames, MD Referring Physician Endocrinology  09/07/20   Jene Every, MD Consulting Physician Orthopedic Surgery  10/31/21   Ermalene Postin, RN (Inactive) Registered Nurse   02/27/22     Chief Complaint  Patient presents with   Medical Management of Chronic Issues     Subjective:  [name] [name]  is a 67 y.o. female presents for  Back pain: Patient reports appliance with Lyrica 50 mg twice daily.Pt reports lyrica has helped her condition greatly. CT: 10/29/2021-Disc levels: Disc heights are relatively preserved. Scattered mild anterior endplate spurring throughout the thoracic spine. No high-grade spinal canal or neuroforaminal stenosis.  Patient was referred to orthopedics. Prior note Patient was seen a little over 2 weeks ago for back pain of her thoracic spine that had started to worsen.  She reports the thoracic back pain had been present since November 2022.  She had started seeing a chiropractor routinely for the discomfort, but has not seen much change that is positive.  She noticed approximately 3 weeks ago she is having more increasing pain, pain over the thoracic spine itself and referred pain to her right axilla.  She also endorses some decreased sensation under her right axilla, that she now asked to watch when she is shaving underneath her arm because she cannot feel the razor. Tx: medrol dose pack, baclofen.  She reports the medications helped temporarily, but as soon as the steroid Dosepak was completed her pain returned very quickly. There has been no known injury  or overactivity that created discomfort. NSAIDs are contraindicated with chronic anticoagulation.  No results for input(s): "HGB", "HCT", "WBC", "PLT" in the last 168 hours.     Latest Ref Rng & Units 01/29/2023   12:00 AM 11/25/2022    9:55 PM 10/12/2021   10:45 AM  CMP  Glucose 70 - 99 mg/dL  213  086   BUN 4 - 21 18     11  19    Creatinine 0.5 - 1.1 1.0     0.99  1.09   Sodium 137 - 147 142     141  141   Potassium 3.5 - 5.1 mEq/L 4.9     4.1  4.8   Chloride 99 - 108 103     103  102   CO2 13 - 22 24     27  24    Calcium 8.7 - 10.7 10.0     9.2  9.8   Total Protein 6.0 - 8.5 g/dL   6.9   Total Bilirubin 0.0 - 1.2 mg/dL   0.4   Alkaline Phos 25 - 125 59      61   AST 13 - 35 18      17   ALT 7 - 35 U/L 19      22      This result is from an external source.       04/11/2022    2:16 PM 04/04/2021   11:48 AM 09/06/2020   11:25 AM 06/03/2018    4:19 PM 09/06/2014    8:33  AM  Depression screen PHQ 2/9  Decreased Interest 0 0 0 2 0  Down, Depressed, Hopeless 0 0 0 1 0  PHQ - 2 Score 0 0 0 3 0  Altered sleeping    0   Tired, decreased energy    2   Change in appetite    3   Feeling bad or failure about yourself     0   Trouble concentrating    0   Moving slowly or fidgety/restless    0   Suicidal thoughts    0   PHQ-9 Score    8     Allergies  Allergen Reactions   Epinephrine Other (See Comments)    Pass out    Other     Other reaction(s): Myalgia   Statins Other (See Comments)    Myalgias: Simvastatin also caused back ache   Welchol [Colesevelam Hcl]     myalgia   Amoxicillin Itching and Rash    Has patient had a PCN reaction causing immediate rash, facial/tongue/throat swelling, SOB or lightheadedness with hypotension: No Has patient had a PCN reaction causing severe rash involving mucus membranes or skin necrosis: No Has patient had a PCN reaction that required hospitalization: No Has patient had a PCN reaction occurring within the last 10 years: Yes If all of the  above answers are "NO", then may proceed with Cephalosporin use.    Social History   Tobacco Use   Smoking status: Never    Passive exposure: Never   Smokeless tobacco: Never  Substance Use Topics   Alcohol use: Not Currently   Past Medical History:  Diagnosis Date   Accelerating angina (HCC) 11/04/2017   Allergy    Asthma    Atrial fibrillation (HCC)    Bone spur 10/11/2021   Cataract    Chronic pain disorder    neck and shoulder   Chronic pain of right upper extremity 11/10/2017   Coronary artery disease 2019   Stent x2   CTS (carpal tunnel syndrome) 01/27/2013   right   Diabetes mellitus type II    Diabetic gastroparesis (HCC)    Dysrhythmia    A. Fib   Esophageal reflux 08/25/2013   [facility] [place], Dr Vonda Antigua   FATTY LIVER DISEASE 04/27/2007   Qualifier: Diagnosis of  By: Terrilee Croak CMA, Darlene     Focal muscle atrophy 09/26/2015   Fundic gland polyps of stomach, benign    Globus sensation 04/13/2018   Hordeolum externum of right upper eyelid 12/24/2021   HTN (hypertension)    Hx of adenomatous colonic polyps 12/10/2021   Hyperlipidemia, mixed 05/04/2007   Qualifier: Diagnosis of  By: Nena Jordan crestor caused myalgias even at low dose Livalo caused myalgias Lipitor  Mother with severe reaction myalgias Simvastatin, Welchol    Hypokalemia 05/25/2013   Improved stopping HCTZ. Was noted to have an elevated glucose when K was low. Recheck renal next week after starting Maxzide   Hypomagnesemia 08/10/2014   Hypothyroidism    Iron deficiency anemia 08/07/2018   Iron malabsorption 08/07/2018   Laryngitis 08/25/2017   Leg cramps 04/15/2016   Nausea without vomiting 08/08/2014   NONSPEC ELEVATION OF LEVELS OF TRANSAMINASE/LDH 05/01/2007   Qualifier: Diagnosis of  By: Francesca Jewett     Osteoarthritis    chronic, right knee (11/04/2017)   Plantar fasciitis    Pneumonia    Rosacea 10/22/2016   Urine incontinence    Past Surgical History:  Procedure  Laterality Date  ANTERIOR CERVICAL DECOMP/DISCECTOMY FUSION N/A 11/25/2022   Procedure: ANTERIOR CERVICAL DISCECTOMY AND FUSION CERVICAL FIVE TO SEVEN;  Surgeon: Venita Lick, MD;  Location: [facility] OR;  Service: Orthopedics;  Laterality: N/A;   AUGMENTATION MAMMAPLASTY Bilateral 2002   BLADDER SUSPENSION  1981   CATARACT EXTRACTION W/ INTRAOCULAR LENS IMPLANT Bilateral    COLONOSCOPY     CORONARY PRESSURE/FFR STUDY N/A 11/04/2017   Procedure: INTRAVASCULAR PRESSURE WIRE/FFR STUDY;  Surgeon: Yvonne Kendall, MD;  Location: [facility] INVASIVE CV LAB;  Service: Cardiovascular;  Laterality: N/A;   CORONARY STENT INTERVENTION N/A 11/04/2017   Procedure: CORONARY STENT INTERVENTION;  Surgeon: Yvonne Kendall, MD;  Location: [facility] INVASIVE CV LAB;  Service: Cardiovascular;  Laterality: N/A;   CORONARY ULTRASOUND/IVUS N/A 11/04/2017   Procedure: Intravascular Ultrasound/IVUS;  Surgeon: Yvonne Kendall, MD;  Location: [facility] INVAS[facility];  Service: Cardiovascular;  Laterality: N/A;   ENDOVENOUS ABLATION SAPHENOUS VEIN W/ LASER Left 12/29/2019   endovenous laser ablation left greater saphenous vein by Fabienne Bruns MD    ESOPHAGOGASTRODUODENOSCOPY     KNEE ARTHROSCOPY Right 1998, [id number]   LAPAROSCOPIC CHOLECYSTECTOMY  2007   LEFT HEART CATH AND CORONARY ANGIOGRAPHY N/A 11/04/2017   Procedure: LEFT HEART CATH AND CORONARY ANGIOGRAPHY;  Surgeon: Yvonne Kendall, MD;  Location: [facility] INVAS[facility];  Service: Cardiovascular;  Laterality: N/A;   LEFT HEART CATHETERIZATION WITH CORONARY ANGIOGRAM N/A 11/21/2011   Procedure: LEFT HEART CATHETERIZATION WITH CORONARY ANGIOGRAM;  Surgeon: Wendall Stade, MD;  Location[facility] C[facility] LAB;  Service: Cardiovascular;  Laterality: N/A;   TONSILLECTOMY  1975   VAGINAL HYSTERECTOMY  1983   "still have my ovaries"   Family History  Problem Relation Age of Onset   Alzheimer's disease Mother    Diabetes type II Mother    Hiatal hernia Mother    Diabetes Mother    Emphysema  Father    COPD Father    Depression Sister        suicide   Diabetes Sister    Diabetes Daughter    Colon cancer Neg Hx    Stomach cancer Neg Hx    Esophageal cancer Neg Hx    Rectal cancer Neg Hx    Allergies as of 02/07/2023       Reactions   Epinephrine Other (See Comments)   Pass out    Other    Other reaction(s): Myalgia   Statins Other (See Comments)   Myalgias: Simvastatin also caused back ache   Welchol [colesevelam Hcl]    myalgia   Amoxicillin Itching, Rash   Has patient had a PCN reaction causing immediate rash, facial/tongue/throat swelling, SOB or lightheadedness with hypotension: No Has patient had a PCN reaction causing severe rash involving mucus membranes or skin necrosis: No Has patient had a PCN reaction that required hospitalization: No Has patient had a PCN reaction occurring within the last 10 years: Yes If all of the above answers are "NO", then may proceed with Cephalosporin use.        Medication List        Accurate as of February 07, 2023  4:19 PM. If you have any questions, ask your nurse or doctor.          STOP taking these medications    Accu-Chek Softclix Lancets lancets Stopped by: Felix Pacini       TAKE these medications    CALTRATE 600 PO Take 650 mg by mouth daily.   Eliquis 5 MG Tabs tablet Generic drug: apixaban TAKE 1  TABLET TWICE A DAY   ezetimibe 10 MG tablet Commonly known as: ZETIA Take 10 mg by mouth daily.   fenofibrate 160 MG tablet TAKE 1 TABLET DAILY   fexofenadine 180 MG tablet Commonly known as: ALLEGRA Take 180 mg by mouth daily.   isosorbide mononitrate 30 MG 24 hr tablet Commonly known as: IMDUR Take 1 tablet (30 mg total) by mouth daily.   ketoconazole 2 % shampoo Commonly known as: NIZORAL Apply 1 Application topically 2 (two) times a week.   levothyroxine 88 MCG tablet Commonly known as: SYNTHROID Take 88 mcg by mouth every morning.   Magnesium 250 MG Tabs Take 250 mg by mouth  daily.   metFORMIN 500 MG 24 hr tablet Commonly known as: GLUCOPHAGE-XR Take 500 mg by mouth 2 (two) times daily.   metoprolol tartrate 25 MG tablet Commonly known as: LOPRESSOR TAKE 1 TABLET TWICE A DAY. TAKE AN EXTRA DOSE FOR BREAK THROUGH ATRIAL FIBRILLATION WITH INCREASED HEART RATE GREATER THAN 100.   nitroGLYCERIN 0.4 MG SL tablet Commonly known as: NITROSTAT Place 1 tablet (0.4 mg total) under the tongue every 5 (five) minutes as needed for chest pain.   NovoLIN 70/30 Kwikpen (70-30) 100 UNIT/ML KwikPen Generic drug: insulin isophane & regular human KwikPen Inject 14-25 Units into the skin See admin instructions. Take 24 mg in the morning 14 mg at lunch and 25 mg a bedtime   pantoprazole 40 MG tablet Commonly known as: PROTONIX Take 1 tablet (40 mg total) by mouth 2 (two) times daily before a meal.   pregabalin 50 MG capsule Commonly known as: LYRICA Take 1 capsule (50 mg total) by mouth 2 (two) times daily.   PROBIOTIC DAILY PO Take 1 tablet by mouth daily.   Refresh Optive Mega-3 0.5-1-0.5 % Soln Generic drug: Carboxymeth-Glyc-Polysorb PF Place 1 drop into both eyes 3 (three) times daily.   VESALIUS evolocumab (AMG 145) or placebo 140 mg/mL SQ injection Inject 1 mL (140 mg total) into the skin every 14 (fourteen) days. Return kit at next visit.If any questions or concerns occur regarding this medication, please contact [facility]. **For Investigational Drug Use Only**        All past medical history, surgical history, allergies, family history, immunizations and medications were updated in the EMR today and reviewed under the history and medication portions of their EMR.      ROS: Negative, with the exception of above mentioned in HPI   Objective:  BP 121/72   Pulse 60   Temp 97.9 F (36.6 C)   Wt 185 lb 9.6 oz (84.2 kg)   SpO2 95%   BMI 30.42 kg/m  Body mass index is 30.42 kg/m. Physical Exam Vitals and nursing note reviewed.   Constitutional:      General: She is not in acute distress.    Appearance: Normal appearance. She is normal weight. She is not ill-appearing or toxic-appearing.  HENT:     Head: Normocephalic and atraumatic.  Eyes:     General: No scleral icterus.       Right eye: No discharge.        Left eye: No discharge.     Extraocular Movements: Extraocular movements intact.     Conjunctiva/sclera: Conjunctivae normal.     Pupils: Pupils are equal, round, and reactive to light.  Musculoskeletal:        General: No swelling.  Skin:    Findings: No rash.  Neurological:     Mental Status: She is alert  and oriented to person, place, and time. Mental status is at baseline.     Motor: No weakness.     Coordination: Coordination normal.     Gait: Gait normal.  Psychiatric:        Mood and Affect: Mood normal.        Behavior: Behavior normal.        Thought Content: Thought content normal.        Judgment: Judgment normal.      Assessment/Plan: [name] [name] is a 67 y.o. female present for OV for  Thoracic degenerative disc disease/bone spur/dextroscoliosis of thoracic spine/thoracic radiculopathy/neurosensory deficit/paresthesia/right thoracic back pain Stable CT thoracic spine: Disc heights are relatively preserved.  Scattered mild anterior endplate spurring throughout the thoracic spine.  No high-grade spinal canal or neural foraminal stenosis Continue Lyrica 50 mg twice daily prescribed.    Reviewed expectations re: course of current medical issues. Discussed self-management of symptoms. Outlined signs and symptoms indicating need for more acute intervention. Patient verbalized understanding and all questions were answered. Patient received an After-Visit Summary. Any changes in medications were reviewed and patient was provided with updated med list with their AVS.  Return in about 24 weeks (around 07/25/2023) for Routine chronic condition follow-up.   Meds ordered this  encounter  Medications   pregabalin (LYRICA) 50 MG capsule    Sig: Take 1 capsule (50 mg total) by mouth 2 (two) times daily.    Dispense:  180 capsule    Refill:  1    No orders of the defined types were placed in this encounter.    Note is dictated utilizing voice recognition software. Although note has been proof read prior to signing, occasional typographical errors still can be missed. If any questions arise, please do not hesitate to call for verification.   electronically signed by:  Felix Pacini, DO  [facility] [place]

## 2023-02-26 ENCOUNTER — Encounter: Payer: Self-pay | Admitting: *Deleted

## 2023-04-14 ENCOUNTER — Other Ambulatory Visit: Payer: Self-pay | Admitting: Cardiology

## 2023-04-16 ENCOUNTER — Encounter: Payer: Self-pay | Admitting: Cardiology

## 2023-04-16 ENCOUNTER — Ambulatory Visit: Payer: Medicare Other

## 2023-04-16 DIAGNOSIS — Z0000 Encounter for general adult medical examination without abnormal findings: Secondary | ICD-10-CM | POA: Diagnosis not present

## 2023-04-16 NOTE — Progress Notes (Signed)
Subjective:   [name] [name] is a 67 y.o. female who presents for Medicare Annual (Subsequent) preventive examination.  Visit Complete: Virtual I connected with  [name] [name] on 04/16/23 by a audio enabled telemedicine application and verified that I am speaking with the correct person using two identifiers.  Patient Location: Home  Provider Location: Home Office  I discussed the limitations of evaluation and management by telemedicine. The patient expressed understanding and agreed to proceed.  Vital Signs: Because this visit was a virtual/telehealth visit, some criteria may be missing or patient reported. Any vitals not documented were not able to be obtained and vitals that have been documented are patient reported.    Cardiac Risk Factors include: advanced age (>70men, >73 women);hypertension     Objective:    There were no vitals filed for this visit. There is no height or weight on file to calculate BMI.     04/16/2023    3:38 PM 11/26/2022    1:00 AM 11/20/2022    8:20 AM 04/11/2022    2:20 PM 04/04/2021   11:50 AM 08/27/2020    9:00 AM 08/26/2020    2:08 PM  Advanced Directives  Does Patient Have a Medical Advance Directive? Yes Yes Yes Yes Yes Yes Yes  Type of Estate agent of [employer] Power of [place];Living will Healthcare Power of [place];Living will Healthcare Power of [place];Living will Healthcare Power of [employer] Power of Attorney   Does patient want to make changes to medical advance directive?  No - Patient declined  No - Patient declined  No - Patient declined   Copy of Healthcare Power of Attorney in Chart? No - copy requested No - copy requested No - copy requested No - copy requested No - copy requested No - copy requested   Would patient like information on creating a medical advance directive?  No - Patient declined         Current Medications (verified) Outpatient Encounter Medications as of 04/16/2023   Medication Sig   Calcium Carbonate (CALTRATE 600 PO) Take 650 mg by mouth daily.   Carboxymeth-Glyc-Polysorb PF (REFRESH OPTIVE MEGA-3) 0.5-1-0.5 % SOLN Place 1 drop into both eyes 3 (three) times daily.   ELIQUIS 5 MG TABS tablet TAKE 1 TABLET TWICE A DAY   ezetimibe (ZETIA) 10 MG tablet Take 10 mg by mouth daily.   fenofibrate 160 MG tablet TAKE 1 TABLET DAILY   fexofenadine (ALLEGRA) 180 MG tablet Take 180 mg by mouth daily.   isosorbide mononitrate (IMDUR) 30 MG 24 hr tablet Take 1 tablet (30 mg total) by mouth daily.   ketoconazole (NIZORAL) 2 % shampoo Apply 1 Application topically 2 (two) times a week.   levothyroxine (SYNTHROID) 88 MCG tablet Take 88 mcg by mouth every morning.   Magnesium 250 MG TABS Take 250 mg by mouth daily.   metFORMIN (GLUCOPHAGE-XR) 500 MG 24 hr tablet Take 500 mg by mouth 2 (two) times daily.   metoprolol tartrate (LOPRESSOR) 25 MG tablet TAKE 1 TABLET TWICE A DAY. TAKE AN EXTRA DOSE FOR BREAK THROUGH ATRIAL FIBRILLATION WITH INCREASED HEART RATE GREATER THAN 100.   nitroGLYCERIN (NITROSTAT) 0.4 MG SL tablet Place 1 tablet (0.4 mg total) under the tongue every 5 (five) minutes as needed for chest pain.   NOVOLIN 70/30 KWIKPEN (70-30) 100 UNIT/ML KwikPen Inject 14-25 Units into the skin See admin instructions. Take 24 mg in the morning 14 mg at lunch and 25 mg a bedtime  pantoprazole (PROTONIX) 40 MG tablet Take 1 tablet (40 mg total) by mouth 2 (two) times daily before a meal.   pregabalin (LYRICA) 50 MG capsule Take 1 capsule (50 mg total) by mouth 2 (two) times daily.   Probiotic Product (PROBIOTIC DAILY PO) Take 1 tablet by mouth daily.   Study - VESALIUS - evolocumab (AMG 145) 140 mg/mL or placebo SQ injection (PI-Hilty) Inject 1 mL (140 mg total) into the skin every 14 (fourteen) days. Return kit at next visit.If any questions or concerns occur regarding this medication, please contact [facility]. **For Investigational Drug Use Only**   No  facility-administered encounter medications on file as of 04/16/2023.    Allergies (verified) Epinephrine, Other, Statins, Welchol [colesevelam hcl], and Amoxicillin   History: Past Medical History:  Diagnosis Date   Accelerating angina (HCC) 11/04/2017   Allergy    Asthma    Atrial fibrillation (HCC) 08/27/2020   Bone spur 10/11/2021   Cataract    Chronic kidney disease    Chronic pain disorder    neck and shoulder   Chronic pain of right upper extremity 11/10/2017   Coronary artery disease 2019   Stent x2   CTS (carpal tunnel syndrome) 01/27/2013   right   Diabetes mellitus type II    Diabetic gastroparesis (HCC)    Dysrhythmia    A. Fib   Esophageal reflux 08/25/2013   [place] [facility], Dr Vonda Antigua   FATTY LIVER DISEASE 04/27/2007   Qualifier: Diagnosis of  By: Terrilee Croak CMA, Darlene     Focal muscle atrophy 09/26/2015   Fundic gland polyps of stomach, benign    Globus sensation 04/13/2018   Hordeolum externum of right upper eyelid 12/24/2021   HTN (hypertension)    Hx of adenomatous colonic polyps 12/10/2021   Hyperlipidemia, mixed 05/04/2007   Qualifier: Diagnosis of  By: Nena Jordan crestor caused myalgias even at low dose Livalo caused myalgias Lipitor  Mother with severe reaction myalgias Simvastatin, Welchol    Hypokalemia 05/25/2013   Improved stopping HCTZ. Was noted to have an elevated glucose when K was low. Recheck renal next week after starting Maxzide   Hypomagnesemia 08/10/2014   Hypothyroidism    Iron deficiency anemia 08/07/2018   Iron malabsorption 08/07/2018   Laryngitis 08/25/2017   Leg cramps 04/15/2016   Nausea without vomiting 08/08/2014   NONSPEC ELEVATION OF LEVELS OF TRANSAMINASE/LDH 05/01/2007   Qualifier: Diagnosis of  By: Francesca Jewett     Osteoarthritis    chronic, right knee (11/04/2017)   Plantar fasciitis    Pneumonia    Rosacea 10/22/2016   Urine incontinence    Past Surgical History:  Procedure Laterality Date    ANTERIOR CERVICAL DECOMP/DISCECTOMY FUSION N/A 11/25/2022   Procedure: ANTERIOR CERVICAL DISCECTOMY AND FUSION CERVICAL FIVE TO SEVEN;  Surgeon: Venita Lick, MD;  Location: [facility] OR;  Service: Orthopedics;  Laterality: N/A;   AUGMENTATION MAMMAPLASTY Bilateral 2002   BLADDER SUSPENSION  1981   CATARACT EXTRACTION W/ INTRAOCULAR LENS IMPLANT Bilateral    COLONOSCOPY     CORONARY PRESSURE/FFR STUDY N/A 11/04/2017   Procedure: INTRAVASCULAR PRESSURE WIRE/FFR STUDY;  Surgeon: Yvonne Kendall, MD;  Location: [facility] INVASIVE CV LAB;  Service: Cardiovascular;  Laterality: N/A;   CORONARY STENT INTERVENTION N/A 11/04/2017   Procedure: CORONARY STENT INTERVENTION;  Surgeon: Yvonne Kendall, MD;  Location: [facility] INVASIVE CV LAB;  Service: Cardiovascular;  Laterality: N/A;   CORONARY ULTRASOUND/IVUS N/A 11/04/2017   Procedure: Intravascular Ultrasound/IVUS;  Surgeon: Yvonne Kendall, MD;  Location: [facility] INVASIVE CV LAB;  Service: Cardiovascular;  Laterality: N/A;   ENDOVENOUS ABLATION SAPHENOUS VEIN W/ LASER Left 12/29/2019   endovenous laser ablation left greater saphenous vein by Fabienne Bruns MD    ESOPHAGOGASTRODUODENOSCOPY     KNEE ARTHROSCOPY Right 1998, [id number]   LAPAROSCOPIC CHOLECYSTECTOMY  2007   LEFT HEART CATH AND CORONARY ANGIOGRAPHY N/A 11/04/2017   Procedure: LEFT HEART CATH AND CORONARY ANGIOGRAPHY;  Surgeon: Yvonne Kendall, MD;  Location: [facility] INVASIVE CV LAB;  Service: Cardiovascular;  Laterality: N/A;   LEFT HEART CATHETERIZATION WITH CORONARY ANGIOGRAM N/A 11/21/2011   Procedure: LEFT HEART CATHETERIZATION WITH CORONARY ANGIOGRAM;  Surgeon: Wendall Stade, MD;  Location[facility] CATH LAB;  Service: Cardiovascular;  Laterality: N/A;   TONSILLECTOMY  1975   VAGINAL HYSTERECTOMY  1983   "still have my ovaries"   Family History  Problem Relation Age of Onset   Alzheimer's disease Mother    Diabetes type II Mother    Hiatal hernia Mother    Diabetes Mother    Emphysema Father    COPD Father     Depression Sister        suicide   Diabetes Sister    Diabetes Daughter    Colon cancer Neg Hx    Stomach cancer Neg Hx    Esophageal cancer Neg Hx    Rectal cancer Neg Hx    Social History   Socioeconomic History   Marital status: Married    Spouse name: Not on file   Number of children: 1   Years of education: Not on file   Highest education level: 12th grade  Occupational History   Occupation: [employer]  Tobacco Use   Smoking status: Never    Passive exposure: Never   Smokeless tobacco: Never  Vaping Use   Vaping status: Never Used  Substance and Sexual Activity   Alcohol use: Not Currently   Drug use: Never   Sexual activity: Not Currently    Partners: Male  Other Topics Concern   Not on file  Social History Narrative   Marital status/children/pets: Married, 1 child.    Education/employment: HS   Safety:      -smoke alarm in the home:Yes     - wears seatbelt: Yes     - Feels safe in their relationships: Yes   Social Determinants of Health   Financial Resource Strain: Low Risk  (04/16/2023)   Overall Financial Resource Strain (CARDIA)    Difficulty of Paying Living Expenses: Not hard at all  Food Insecurity: No Food Insecurity (04/16/2023)   Hunger Vital Sign    Worried About Running Out of Food in the Last Year: Never true    Ran Out of Food in the Last Year: Never true  Transportation Needs: No Transportation Needs (04/16/2023)   PRAPARE - Administrator, Civil Service (Medical): No    Lack of Transportation (Non-Medical): No  Physical Activity: Insufficiently Active (04/16/2023)   Exercise Vital Sign    Days of Exercise per Week: 4 days    Minutes of Exercise per Session: 30 min  Stress: No Stress Concern Present (04/16/2023)   [employer] of Occupational Health - Occupational Stress Questionnaire    Feeling of Stress : Not at all  Social Connections: Moderately Integrated (04/16/2023)   Social Connection and  Isolation Panel [NHANES]    Frequency of Communication with Friends and Family: More than three times a week    Frequency of Social Gatherings  with Friends and Family: Once a week    Attends Religious Services: Never    Database administrator or Organizations: Yes    Attends Engineer, structural: 1 to 4 times per year    Marital Status: Married    Tobacco Counseling Counseling given: Not Answered   Clinical Intake:  Pre-visit preparation completed: Yes  Pain : No/denies pain     Diabetes: Yes CBG done?: No Did pt. bring in CBG monitor from home?: No  How often do you need to have someone help you when you read instructions, pamphlets, or other written materials from your doctor or pharmacy?: 1 - Never  Interpreter Needed?: No  Information entered by :: Remi Haggard LPN   Activities of Daily Living    04/16/2023    3:39 PM 11/26/2022    1:00 AM  In your present state of health, do you have any difficulty performing the following activities:  Hearing? 1 1  Vision? 0 0  Difficulty concentrating or making decisions? 0 0  Walking or climbing stairs? 1 1  Dressing or bathing? 0 0  Doing errands, shopping? 0 0  Preparing Food and eating ? N   Using the Toilet? N   In the past six months, have you accidently leaked urine? Y   Do you have problems with loss of bowel control? N   Managing your Medications? N   Managing your Finances? N   Housekeeping or managing your Housekeeping? N     Patient Care Team: Natalia Leatherwood, DO as PCP - General (Family Medicine) Rollene Rotunda, MD as PCP - Cardiology (Cardiology) Iva Boop, MD as Consulting Physician (Gastroenterology) Chilton Greathouse, MD as Consulting Physician (Pulmonary Disease) Dorisann Frames, MD as Referring Physician (Endocrinology) Jene Every, MD as Consulting Physician (Orthopedic Surgery) Ermalene Postin, RN (Inactive) as Registered Nurse  Indicate any recent Medical Services you may have  received from other than [facility] providers in the past year (date may be approximate).     Assessment:   This is a routine wellness examination for [place].  Hearing/Vision screen Hearing Screening - Comments:: Bilateral hearing aids Vision Screening - Comments:: Up to date Len Childs   Goals Addressed             This Visit's Progress    Weight (lb) < 200 lb (90.7 kg)         Depression Screen    04/16/2023    3:43 PM 04/11/2022    2:16 PM 04/04/2021   11:48 AM 09/06/2020   11:25 AM 06/03/2018    4:19 PM 09/06/2014    8:33 AM  PHQ 2/9 Scores  PHQ - 2 Score 0 0 0 0 3 0  PHQ- 9 Score 0    8     Fall Risk    04/16/2023    3:35 PM 02/07/2023    9:48 AM 11/01/2022    8:55 PM 04/11/2022    2:23 PM 04/04/2021   11:51 AM  Fall Risk   Falls in the past year? 0 0 0 0 0  Number falls in past yr: 0 0  0 0  Injury with Fall? 0 0  0 0  Risk for fall due to :    No Fall Risks Impaired vision  Follow up Falls evaluation completed;Education provided;Falls prevention discussed Falls evaluation completed   Falls prevention discussed    MEDICARE RISK AT HOME: Medicare Risk at Home Any stairs in or around the home?:  No If so, are there any without handrails?: No Home free of loose throw rugs in walkways, pet beds, electrical cords, etc?: Yes Adequate lighting in your home to reduce risk of falls?: Yes Life alert?: No Use of a cane, walker or w/c?: No Grab bars in the bathroom?: No Shower chair or bench in shower?: No Elevated toilet seat or a handicapped toilet?: Yes  TIMED UP AND GO:  Was the test performed?  No    Cognitive Function:        04/16/2023    3:40 PM 04/11/2022    2:27 PM 04/04/2021   11:53 AM  6CIT Screen  What Year? 0 points 0 points 0 points  What month? 0 points 0 points 0 points  What time? 0 points 0 points 0 points  Count back from 20 0 points 0 points 0 points  Months in reverse 0 points 0 points 0 points  Repeat phrase 0 points 0 points 0 points   Total Score 0 points 0 points 0 points    Immunizations Immunization History  Administered Date(s) Administered   Fluad Quad(high Dose 65+) 02/25/2020, 03/13/2021, 04/11/2022   Influenza Split 03/27/2011, 04/08/2012   Influenza Whole 03/14/2009, 02/06/2010   Influenza, Quadrivalent, Recombinant, Inj, Pf 03/13/2021   Influenza,inj,Quad PF,6+ Mos 01/27/2013, 05/02/2014, 04/12/2017, 03/31/2018, 03/15/2019   Influenza-Unspecified 02/17/2023   PFIZER(Purple Top)SARS-COV-2 Vaccination 08/08/2019, 09/01/2019, 03/13/2021   PNEUMOCOCCAL CONJUGATE-20 04/11/2022   Pfizer Covid-19 Vaccine Bivalent Booster 54yrs & up 03/13/2021   Pneumococcal Polysaccharide-23 03/28/2008   Tdap 09/18/2011   Zoster Recombinant(Shingrix) 04/11/2022, 09/24/2022   Zoster, Live 04/15/2016    TDAP status: Due, Education has been provided regarding the importance of this vaccine. Advised may receive this vaccine at local pharmacy or Health Dept. Aware to provide a copy of the vaccination record if obtained from local pharmacy or Health Dept. Verbalized acceptance and understanding.  Flu Vaccine status: Up to date  Pneumococcal vaccine status: Up to date  Covid-19 vaccine status: Information provided on how to obtain vaccines.   Qualifies for Shingles Vaccine? No   Zostavax completed Yes   Shingrix Completed?: Yes  Screening Tests Health Maintenance  Topic Date Due   Diabetic kidney evaluation - Urine ACR  07/19/2022   DTaP/Tdap/Td (2 - Td or Tdap) 04/15/2024 (Originally 09/17/2021)   OPHTHALMOLOGY EXAM  04/27/2023   HEMOGLOBIN A1C  07/29/2023   FOOT EXAM  08/08/2023   Diabetic kidney evaluation - eGFR measurement  01/29/2024   MAMMOGRAM  03/07/2024   Medicare Annual Wellness (AWV)  04/15/2024   Colonoscopy  12/04/2028   Pneumonia Vaccine 31+ Years old  Completed   INFLUENZA VACCINE  Completed   DEXA SCAN  Completed   Hepatitis C Screening  Completed   Zoster Vaccines- Shingrix  Completed   HPV VACCINES   Aged Out   COVID-19 Vaccine  Discontinued    Health Maintenance  Health Maintenance Due  Topic Date Due   Diabetic kidney evaluation - Urine ACR  07/19/2022    Colorectal cancer screening: Type of screening: Colonoscopy. Completed 2023. Repeat every 7 years  Mammogram status: Completed  . Repeat every year  Bone Density status: Completed 2024. Results reflect: Bone density results: OSTEOPENIA. Repeat every 3 years.  Lung Cancer Screening: (Low Dose CT Chest recommended if Age 16-80 years, 20 pack-year currently smoking OR have quit w/in 15years.) does not qualify.   Lung Cancer Screening Referral:   Additional Screening:  Hepatitis C Screening: does not qualify; Completed 2016  Vision Screening:  Recommended annual ophthalmology exams for early detection of glaucoma and other disorders of the eye. Is the patient up to date with their annual eye exam?  Yes  Who is the provider or what is the name of the office in which the patient attends annual eye exams? [facility] If pt is not established with a provider, would they like to be referred to a provider to establish care? No .   Dental Screening: Recommended annual dental exams for proper oral hygiene  Nutrition Risk Assessment:  Has the patient had any N/V/D within the last 2 months?  No  Does the patient have any non-healing wounds?  No  Has the patient had any unintentional weight loss or weight gain?  No   Diabetes:  Is the patient diabetic?  Yes  If diabetic, was a CBG obtained today?  No  Did the patient bring in their glucometer from home?  No  How often do you monitor your CBG's? Libre 7-8 timesa day.   Financial Strains and Diabetes Management:  Are you having any financial strains with the device, your supplies or your medication? No .  Does the patient want to be seen by Chronic Care Management for management of their diabetes?  No  Would the patient like to be referred to a Nutritionist or for Diabetic  Management?  No   Diabetic Exams:  Diabetic Eye Exam: Completed  Pt has been advised about the importance in completing this exam.  Diabetic Foot Exam: . Pt has been advised about the importance in completing this exam  Community Resource Referral / Chronic Care Management: CRR required this visit?  No   CCM required this visit?  No     Plan:     I have personally reviewed and noted the following in the patient's chart:   Medical and social history Use of alcohol, tobacco or illicit drugs  Current medications and supplements including opioid prescriptions. Patient is not currently taking opioid prescriptions. Functional ability and status Nutritional status Physical activity Advanced directives List of other physicians Hospitalizations, surgeries, and ER visits in previous 12 months Vitals Screenings to include cognitive, depression, and falls Referrals and appointments  In addition, I have reviewed and discussed with patient certain preventive protocols, quality metrics, and best practice recommendations. A written personalized care plan for preventive services as well as general preventive health recommendations were provided to patient.     Remi Haggard, LPN   [id number]/[id number]   After Visit Summary: (MyChart) Due to this being a telephonic visit, the after visit summary with patients personalized plan was offered to patient via MyChart   Nurse Notes:

## 2023-04-16 NOTE — Patient Instructions (Signed)
[name] , Thank you for taking time to come for your Medicare Wellness Visit. I appreciate your ongoing commitment to your health goals. Please review the following plan we discussed and let me know if I can assist you in the future.   Screening recommendations/referrals: Colonoscopy: up to date Mammogram: up to date Bone Density: up to date Recommended yearly ophthalmology/optometry visit for glaucoma screening and checkup Recommended yearly dental visit for hygiene and checkup  Vaccinations: Influenza vaccine: up to date Pneumococcal vaccine: up to date Tdap vaccine: Education provided Shingles vaccine: up to date    Advanced directives: yes    Preventive Care 65 Years and Older, Female Preventive care refers to lifestyle choices and visits with your health care provider that can promote health and wellness. What does preventive care include? A yearly physical exam. This is also called an annual well check. Dental exams once or twice a year. Routine eye exams. Ask your health care provider how often you should have your eyes checked. Personal lifestyle choices, including: Daily care of your teeth and gums. Regular physical activity. Eating a healthy diet. Avoiding tobacco and drug use. Limiting alcohol use. Practicing safe sex. Taking low-dose aspirin every day. Taking vitamin and mineral supplements as recommended by your health care provider. What happens during an annual well check? The services and screenings done by your health care provider during your annual well check will depend on your age, overall health, lifestyle risk factors, and family history of disease. Counseling  Your health care provider may ask you questions about your: Alcohol use. Tobacco use. Drug use. Emotional well-being. Home and relationship well-being. Sexual activity. Eating habits. History of falls. Memory and ability to understand (cognition). Work and work  Astronomer. Reproductive health. Screening  You may have the following tests or measurements: Height, weight, and BMI. Blood pressure. Lipid and cholesterol levels. These may be checked every 5 years, or more frequently if you are over 33 years old. Skin check. Lung cancer screening. You may have this screening every year starting at age 61 if you have a 30-pack-year history of smoking and currently smoke or have quit within the past 15 years. Fecal occult blood test (FOBT) of the stool. You may have this test every year starting at age 74. Flexible sigmoidoscopy or colonoscopy. You may have a sigmoidoscopy every 5 years or a colonoscopy every 10 years starting at age 1. Hepatitis C blood test. Hepatitis B blood test. Sexually transmitted disease (STD) testing. Diabetes screening. This is done by checking your blood sugar (glucose) after you have not eaten for a while (fasting). You may have this done every 1-3 years. Bone density scan. This is done to screen for osteoporosis. You may have this done starting at age 21. Mammogram. This may be done every 1-2 years. Talk to your health care provider about how often you should have regular mammograms. Talk with your health care provider about your test results, treatment options, and if necessary, the need for more tests. Vaccines  Your health care provider may recommend certain vaccines, such as: Influenza vaccine. This is recommended every year. Tetanus, diphtheria, and acellular pertussis (Tdap, Td) vaccine. You may need a Td booster every 10 years. Zoster vaccine. You may need this after age 26. Pneumococcal 13-valent conjugate (PCV13) vaccine. One dose is recommended after age 47. Pneumococcal polysaccharide (PPSV23) vaccine. One dose is recommended after age 34. Talk to your health care provider about which screenings and vaccines you need and how often you  need them. This information is not intended to replace advice given to you by  your health care provider. Make sure you discuss any questions you have with your health care provider. Document Released: 06/09/2015 Document Revised: 01/31/2016 Document Reviewed: 03/14/2015 Elsevier Interactive Patient Education  2017 [employer].  Fall Prevention in the Home Falls can cause injuries. They can happen to people of all ages. There are many things you can do to make your home safe and to help prevent falls. What can I do on the outside of my home? Regularly fix the edges of walkways and driveways and fix any cracks. Remove anything that might make you trip as you walk through a door, such as a raised step or threshold. Trim any bushes or trees on the path to your home. Use bright outdoor lighting. Clear any walking paths of anything that might make someone trip, such as rocks or tools. Regularly check to see if handrails are loose or broken. Make sure that both sides of any steps have handrails. Any raised decks and porches should have guardrails on the edges. Have any leaves, snow, or ice cleared regularly. Use sand or salt on walking paths during winter. Clean up any spills in your garage right away. This includes oil or grease spills. What can I do in the bathroom? Use night lights. Install grab bars by the toilet and in the tub and shower. Do not use towel bars as grab bars. Use non-skid mats or decals in the tub or shower. If you need to sit down in the shower, use a plastic, non-slip stool. Keep the floor dry. Clean up any water that spills on the floor as soon as it happens. Remove soap buildup in the tub or shower regularly. Attach bath mats securely with double-sided non-slip rug tape. Do not have throw rugs and other things on the floor that can make you trip. What can I do in the bedroom? Use night lights. Make sure that you have a light by your bed that is easy to reach. Do not use any sheets or blankets that are too big for your bed. They should not hang  down onto the floor. Have a firm chair that has side arms. You can use this for support while you get dressed. Do not have throw rugs and other things on the floor that can make you trip. What can I do in the kitchen? Clean up any spills right away. Avoid walking on wet floors. Keep items that you use a lot in easy-to-reach places. If you need to reach something above you, use a strong step stool that has a grab bar. Keep electrical cords out of the way. Do not use floor polish or wax that makes floors slippery. If you must use wax, use non-skid floor wax. Do not have throw rugs and other things on the floor that can make you trip. What can I do with my stairs? Do not leave any items on the stairs. Make sure that there are handrails on both sides of the stairs and use them. Fix handrails that are broken or loose. Make sure that handrails are as long as the stairways. Check any carpeting to make sure that it is firmly attached to the stairs. Fix any carpet that is loose or worn. Avoid having throw rugs at the top or bottom of the stairs. If you do have throw rugs, attach them to the floor with carpet tape. Make sure that you have a light switch  at the top of the stairs and the bottom of the stairs. If you do not have them, ask someone to add them for you. What else can I do to help prevent falls? Wear shoes that: Do not have high heels. Have rubber bottoms. Are comfortable and fit you well. Are closed at the toe. Do not wear sandals. If you use a stepladder: Make sure that it is fully opened. Do not climb a closed stepladder. Make sure that both sides of the stepladder are locked into place. Ask someone to hold it for you, if possible. Clearly mark and make sure that you can see: Any grab bars or handrails. First and last steps. Where the edge of each step is. Use tools that help you move around (mobility aids) if they are needed. These  include: Canes. Walkers. Scooters. Crutches. Turn on the lights when you go into a dark area. Replace any light bulbs as soon as they burn out. Set up your furniture so you have a clear path. Avoid moving your furniture around. If any of your floors are uneven, fix them. If there are any pets around you, be aware of where they are. Review your medicines with your doctor. Some medicines can make you feel dizzy. This can increase your chance of falling. Ask your doctor what other things that you can do to help prevent falls. This information is not intended to replace advice given to you by your health care provider. Make sure you discuss any questions you have with your health care provider. Document Released: 03/09/2009 Document Revised: 10/19/2015 Document Reviewed: 06/17/2014 Elsevier Interactive Patient Education  2017 [employer].

## 2023-05-13 ENCOUNTER — Encounter: Payer: Medicare Other | Admitting: *Deleted

## 2023-05-13 DIAGNOSIS — Z006 Encounter for examination for normal comparison and control in clinical research program: Secondary | ICD-10-CM

## 2023-05-13 MED ORDER — STUDY - VESALIUS - EVOLOCUMAB (AMG 145) 140 MG/ML OR PLACEBO SQ INJECTION (PI-HILTY)
140.0000 mg | INJECTION | SUBCUTANEOUS | Status: AC
  Administered 2023-05-13: 140 mg via SUBCUTANEOUS
  Filled 2023-05-13: qty 1

## 2023-05-13 MED ORDER — STUDY - VESALIUS - EVOLOCUMAB (AMG 145) 140 MG/ML OR PLACEBO SQ INJECTION (PI-HILTY): 140.0000 mg | INJECTION | SUBCUTANEOUS | 0 refills | Status: DC

## 2023-05-13 NOTE — Research (Addendum)
Vitals: [x]  Experience any AE/SAE/Hospitalizations [x]   If yes please explain:   Non-Fatal Potential Endpoint Assessment Yes  No   Has the subject experienced/undergone any of the following since the last visit/contact?   []   [x]    Any Coronary Artery Revascularization/Cerebrovascular Revascularization/ Peripheral Artery Revascularization/Amputation Procedure   []   [x]    Myocardial Infarction []   [x]    Stroke   []   [x]    Provide the date for the non-fatal Potential Endpoints status:   []   [x]    IP admin please see MAR (please add Box # to comment section on MAR) [x]  Patient brought back:  [id number] = 3 and disposed  Empty IP from [id number] =3 was disposed in clinic   Boxes returned to pharm:  [id number] Patient left box with one IP at home from box [id number] will bring back at later date  Patient given: Box # [id number] = 3 Box # [id number] = 4

## 2023-05-13 NOTE — Research (Signed)
Boxes brought back from patient  [id number]

## 2023-06-02 ENCOUNTER — Other Ambulatory Visit: Payer: Self-pay | Admitting: Cardiology

## 2023-07-29 ENCOUNTER — Other Ambulatory Visit: Payer: Self-pay | Admitting: Cardiology

## 2023-08-11 ENCOUNTER — Encounter: Payer: Self-pay | Admitting: Cardiology

## 2023-08-13 ENCOUNTER — Encounter: Payer: Self-pay | Admitting: Hematology & Oncology

## 2023-08-13 ENCOUNTER — Other Ambulatory Visit (HOSPITAL_COMMUNITY): Payer: Self-pay

## 2023-08-14 ENCOUNTER — Other Ambulatory Visit (HOSPITAL_COMMUNITY): Payer: Self-pay

## 2023-08-14 ENCOUNTER — Telehealth: Payer: Self-pay | Admitting: Pharmacy Technician

## 2023-08-14 NOTE — Telephone Encounter (Signed)
 Ran test claim for xarelto. For a 30 day supply and the co-pay is 336.78 . PA is not needed at this time. Nothing saying this is a transition fill.   This test claim was processed through [facility]- copay amounts may vary at other pharmacies due to pharmacy/plan contracts, or as the patient moves through the different stages of their insurance plan.

## 2023-08-22 ENCOUNTER — Ambulatory Visit: Admitting: Family Medicine

## 2023-08-24 NOTE — Progress Notes (Unsigned)
 Cardiology Office Note:   Date:  08/26/2023  ID:  [name] [name], DOB 03-Aug-1955, MRN [id number] PCP: Natalia Leatherwood, DO  [facility] Providers Cardiologist:  Rollene Rotunda, MD {  History of Present Illness:   [name] [name] is a 68 y.o. female who presents for follow up of coronary artery disease status post DES to ostial/proximal LAD in 6/19 with occlusion of D1 branch    She had a low risk POET (Plain Old Exercise Treadmill) in July 2021.   She presented to the [facility] on 08/26/2020 for evaluation of chest pain.  Echocardiogram 08/27/2020 showed LVEF 65-70% .  Nuclear stress test 08/27/2020 showed no reversible ischemia or infarct, EF 76% and low risk.  She developed paroxysmal atrial fibrillation 08/27/20 and was placed on IV heparin and diltiazem gtt. She converted to sinus rhythm after 2 hours.  CHA2DS2-VASc score 5, TSH 19.14 with free T4 of 0.80.  She was symptomatic and reported jaw tingling and chest pressure with the episode.  A cardiac event monitor was ordered and showed predominantly normal sinus rhythm, sinus bradycardia, sinus tachycardia, isolated SVE's, no atrial flutter atrial fibrillation or pauses noted.   In March 2024 she had some jaw pain.  She had mild ischemia in the anterior wall but this was not a high risk finding.  Since I last saw her she has had no new complaints. The patient denies any new symptoms such as chest discomfort, neck or arm discomfort. There has been no new shortness of breath, PND or orthopnea. There have been no reported palpitations, presyncope or syncope.  She has been limited by some left hip pain but she still does some yard work and household chores.  She has had no symptoms related to this.  She might occasionally get some fluttering in her chest after she has been very active.  ROS: As stated in the HPI and negative for all other systems.  Studies Reviewed:    EKG:   NA  Risk Assessment/Calculations:    CHA2DS2-VASc Score = 5    This indicates a 7.2% annual risk of stroke. The patient's score is based upon: CHF History: 0 HTN History: 1 Diabetes History: 1 Stroke History: 0 Vascular Disease History: 1 Age Score: 1 Gender Score: 1        Physical Exam:   VS:  BP 136/62 (BP Location: Left Arm, Patient Position: Sitting, Cuff Size: Normal)   Pulse 67   Ht 5' 5.5" (1.664 m)   Wt 186 lb 9.6 oz (84.6 kg)   SpO2 98%   BMI 30.58 kg/m    Wt Readings from Last 3 Encounters:  08/26/23 186 lb 9.6 oz (84.6 kg)  05/13/23 181 lb 6.4 oz (82.3 kg)  02/07/23 185 lb 9.6 oz (84.2 kg)     GEN: Well nourished, well developed in no acute distress NECK: No JVD; No carotid bruits CARDIAC: RRR, no murmurs, rubs, gallops RESPIRATORY:  Clear to auscultation without rales, wheezing or rhonchi  ABDOMEN: Soft, non-tender, non-distended EXTREMITIES:  No edema; No deformity   ASSESSMENT AND PLAN:   Coronary artery disease:   The patient has no new sypmtoms.  No further cardiovascular testing is indicated.  We will continue with aggressive risk reduction and meds as listed.   Paroxysmal atrial fibrillation:    She does have some palpitations but no sustained tacky arrhythmias.  She tolerates anticoagulation but she cannot afford the Eliquis.  I am going to have her research the  cost of Pradaxa or Xarelto and if these are not affordable we would need to switch her to warfarin and she will let me know.   Essential hypertension:   Blood pressure is at target.  No change in therapy.   Dyslipidemia:      She is followed in the VESALIUS trial.  Her LDL is not at target.  I did send notes to research to see if I am not to make any adjustments trying to get her LDL to goal.  I will comply with research protocol and patient is aware.  I will be checking LP(a).  DM: A1c is 6.9.  No change in therapy.     Follow up with me in 1 year  Signed, Rollene Rotunda, MD

## 2023-08-25 ENCOUNTER — Encounter: Payer: Self-pay | Admitting: Family Medicine

## 2023-08-25 ENCOUNTER — Ambulatory Visit: Admitting: Family Medicine

## 2023-08-25 NOTE — Telephone Encounter (Signed)
 Patient is asking for Lyrica prescription to be refilled.  This is a controlled substance patient is due for her 6 month follow-up in order to refill this medication.  It does not appear she has a 27-month follow-up scheduled. Please schedule patient at her earliest convenience

## 2023-08-26 ENCOUNTER — Encounter: Payer: Self-pay | Admitting: Cardiology

## 2023-08-26 ENCOUNTER — Ambulatory Visit: Payer: Medicare Other | Attending: Cardiology | Admitting: Cardiology

## 2023-08-26 VITALS — BP 136/62 | HR 67 | Ht 65.5 in | Wt 186.6 lb

## 2023-08-26 DIAGNOSIS — I10 Essential (primary) hypertension: Secondary | ICD-10-CM | POA: Diagnosis present

## 2023-08-26 DIAGNOSIS — I480 Paroxysmal atrial fibrillation: Secondary | ICD-10-CM | POA: Diagnosis not present

## 2023-08-26 DIAGNOSIS — I2510 Atherosclerotic heart disease of native coronary artery without angina pectoris: Secondary | ICD-10-CM | POA: Insufficient documentation

## 2023-08-26 DIAGNOSIS — E785 Hyperlipidemia, unspecified: Secondary | ICD-10-CM | POA: Insufficient documentation

## 2023-08-26 MED ORDER — DABIGATRAN ETEXILATE MESYLATE 150 MG PO CAPS: 150.0000 mg | ORAL_CAPSULE | Freq: Two times a day (BID) | ORAL | 3 refills | Status: AC

## 2023-08-26 NOTE — Addendum Note (Signed)
 Addended by: Jeannette How A on: 08/26/2023 02:02 PM   Modules accepted: Orders

## 2023-08-26 NOTE — Patient Instructions (Addendum)
 Medication Instructions:  Start Pradaxa 150 mg by mouth twice per day.  *If you need a refill on your cardiac medications before your next appointment, please call your pharmacy*  Lab Work: Lpa today.  If you have labs (blood work) drawn today and your tests are completely normal, you will receive your results only by: MyChart Message (if you have MyChart) OR A paper copy in the mail If you have any lab test that is abnormal or we need to change your treatment, we will call you to review the results.   Follow-Up: At [facility], you and your health needs are our priority.  As part of our continuing mission to provide you with exceptional heart care, our providers are all part of one team.  This team includes your primary Cardiologist (physician) and Advanced Practice Providers or APPs (Physician Assistants and Nurse Practitioners) who all work together to provide you with the care you need, when you need it.  Your next appointment:   1 year(s)  Provider:   Rollene Rotunda, MD     We recommend signing up for the patient portal called "MyChart".  Sign up information is provided on this After Visit Summary.  MyChart is used to connect with patients for Virtual Visits (Telemedicine).  Patients are able to view lab/test results, encounter notes, upcoming appointments, etc.  Non-urgent messages can be sent to your provider as well.   To learn more about what you can do with MyChart, go to [email or web address].   Other Instructions Please call your insurance company to find out what your copay for Xarelto and Pradaxa would be and let Dr Antoine Poche know which one would be financially available for you.      1st Floor: - Lobby - Registration  - Pharmacy  - Lab - Cafe  2nd Floor: - PV Lab - Diagnostic Testing (echo, CT, nuclear med)  3rd Floor: - Vacant  4th Floor: - TCTS (cardiothoracic surgery) - AFib Clinic - Structural Heart Clinic - Vascular Surgery  - Vascular  Ultrasound  5th Floor: - HeartCare Cardiology (general and EP) - Clinical Pharmacy for coumadin, hypertension, lipid, weight-loss medications, and med management appointments    Valet parking services will be available as well.

## 2023-08-27 ENCOUNTER — Encounter: Payer: Self-pay | Admitting: Family Medicine

## 2023-08-27 ENCOUNTER — Ambulatory Visit (INDEPENDENT_AMBULATORY_CARE_PROVIDER_SITE_OTHER): Admitting: Family Medicine

## 2023-08-27 VITALS — BP 132/64 | HR 74 | Temp 98.1°F | Wt 186.7 lb

## 2023-08-27 DIAGNOSIS — M5134 Other intervertebral disc degeneration, thoracic region: Secondary | ICD-10-CM

## 2023-08-27 DIAGNOSIS — M5414 Radiculopathy, thoracic region: Secondary | ICD-10-CM | POA: Diagnosis not present

## 2023-08-27 DIAGNOSIS — M546 Pain in thoracic spine: Secondary | ICD-10-CM

## 2023-08-27 LAB — LIPOPROTEIN A (LPA): Lipoprotein (a): 64.8 nmol/L (ref <75.0)

## 2023-08-27 MED ORDER — PREGABALIN 50 MG PO CAPS: 50.0000 mg | ORAL_CAPSULE | Freq: Two times a day (BID) | ORAL | 1 refills | Status: DC

## 2023-08-27 NOTE — Progress Notes (Signed)
 [name] [name] , 07-07-1955, 68 y.o., female MRN: [id number] Patient Care Team    Relationship Specialty Notifications Start End  Natalia Leatherwood, DO PCP - General Family Medicine  06/03/18   Rollene Rotunda, MD PCP - Cardiology Cardiology Admissions 11/14/17   Iva Boop, MD Consulting Physician Gastroenterology  06/05/18   Chilton Greathouse, MD Consulting Physician Pulmonary Disease  06/05/18   Dorisann Frames, MD Referring Physician Endocrinology  09/07/20   Jene Every, MD Consulting Physician Orthopedic Surgery  10/31/21   Ermalene Postin, RN (Inactive) Registered Nurse   02/27/22     Chief Complaint  Patient presents with   Back Pain     Subjective:  [name] [name]  is a 68 y.o. female presents for  Back pain: Patient reports compliance with Lyrica 50 mg twice daily.Pt reports lyrica has helped her condition greatly.  CT: 10/29/2021-Disc levels: Disc heights are relatively preserved. Scattered mild anterior endplate spurring throughout the thoracic spine. No high-grade spinal canal or neuroforaminal stenosis.  Patient was referred to orthopedics. Prior note Patient was seen a little over 2 weeks ago for back pain of her thoracic spine that had started to worsen.  She reports the thoracic back pain had been present since November 2022.  She had started seeing a chiropractor routinely for the discomfort, but has not seen much change that is positive.  She noticed approximately 3 weeks ago she is having more increasing pain, pain over the thoracic spine itself and referred pain to her right axilla.  She also endorses some decreased sensation under her right axilla, that she now asked to watch when she is shaving underneath her arm because she cannot feel the razor. Tx: medrol dose pack, baclofen.  She reports the medications helped temporarily, but as soon as the steroid Dosepak was completed her pain returned very quickly. There has been no known injury or overactivity that  created discomfort. NSAIDs are contraindicated with chronic anticoagulation.  No results for input(s): "HGB", "HCT", "WBC", "PLT" in the last 168 hours.     Latest Ref Rng & Units 01/29/2023   12:00 AM 11/25/2022    9:55 PM 10/12/2021   10:45 AM  CMP  Glucose 70 - 99 mg/dL  914  782   BUN 4 - 21 18     11  19    Creatinine 0.5 - 1.1 1.0     0.99  1.09   Sodium 137 - 147 142     141  141   Potassium 3.5 - 5.1 mEq/L 4.9     4.1  4.8   Chloride 99 - 108 103     103  102   CO2 13 - 22 24     27  24    Calcium 8.7 - 10.7 10.0     9.2  9.8   Total Protein 6.0 - 8.5 g/dL   6.9   Total Bilirubin 0.0 - 1.2 mg/dL   0.4   Alkaline Phos 25 - 125 59      61   AST 13 - 35 18      17   ALT 7 - 35 U/L 19      22      This result is from an external source.       04/16/2023    3:43 PM 04/11/2022    2:16 PM 04/04/2021   11:48 AM 09/06/2020   11:25 AM 06/03/2018    4:19 PM  Depression screen PHQ 2/9  Decreased Interest 0 0 0 0 2  Down, Depressed, Hopeless 0 0 0 0 1  PHQ - 2 Score 0 0 0 0 3  Altered sleeping 0    0  Tired, decreased energy 0    2  Change in appetite 0    3  Feeling bad or failure about yourself  0    0  Trouble concentrating 0    0  Moving slowly or fidgety/restless 0    0  Suicidal thoughts 0    0  PHQ-9 Score 0    8  Difficult doing work/chores Not difficult at all        Allergies  Allergen Reactions   Epinephrine Other (See Comments)    Pass out    Other     Other reaction(s): Myalgia   Statins Other (See Comments)    Myalgias: Simvastatin also caused back ache   Welchol [Colesevelam Hcl]     myalgia   Amoxicillin Itching and Rash    Has patient had a PCN reaction causing immediate rash, facial/tongue/throat swelling, SOB or lightheadedness with hypotension: No Has patient had a PCN reaction causing severe rash involving mucus membranes or skin necrosis: No Has patient had a PCN reaction that required hospitalization: No Has patient had a PCN reaction occurring  within the last 10 years: Yes If all of the above answers are "NO", then may proceed with Cephalosporin use.    Social History   Tobacco Use   Smoking status: Never    Passive exposure: Never   Smokeless tobacco: Never  Substance Use Topics   Alcohol use: Not Currently   Past Medical History:  Diagnosis Date   Accelerating angina (HCC) 11/04/2017   Allergy    Asthma    Atrial fibrillation (HCC) 08/27/2020   Bone spur 10/11/2021   Cataract    Chronic kidney disease    Chronic pain disorder    neck and shoulder   Chronic pain of right upper extremity 11/10/2017   Coronary artery disease 2019   Stent x2   CTS (carpal tunnel syndrome) 01/27/2013   right   Diabetes mellitus type II    Diabetic gastroparesis (HCC)    Dysrhythmia    A. Fib   Esophageal reflux 08/25/2013   [place], Dr Vonda Antigua   FATTY LIVER DISEASE 04/27/2007   Qualifier: Diagnosis of  By: Terrilee Croak CMA, Darlene     Focal muscle atrophy 09/26/2015   Fundic gland polyps of stomach, benign    Globus sensation 04/13/2018   Hordeolum externum of right upper eyelid 12/24/2021   HTN (hypertension)    Hx of adenomatous colonic polyps 12/10/2021   Hyperlipidemia, mixed 05/04/2007   Qualifier: Diagnosis of  By: Nena Jordan crestor caused myalgias even at low dose Livalo caused myalgias Lipitor  Mother with severe reaction myalgias Simvastatin, Welchol    Hypokalemia 05/25/2013   Improved stopping HCTZ. Was noted to have an elevated glucose when K was low. Recheck renal next week after starting Maxzide   Hypomagnesemia 08/10/2014   Hypothyroidism    Iron deficiency anemia 08/07/2018   Iron malabsorption 08/07/2018   Laryngitis 08/25/2017   Leg cramps 04/15/2016   Nausea without vomiting 08/08/2014   NONSPEC ELEVATION OF LEVELS OF TRANSAMINASE/LDH 05/01/2007   Qualifier: Diagnosis of  By: Francesca Jewett     Osteoarthritis    chronic, right knee (11/04/2017)   Plantar fasciitis    Pneumonia  Rosacea 10/22/2016   Urine incontinence    Past Surgical History:  Procedure Laterality Date   ANTERIOR CERVICAL DECOMP/DISCECTOMY FUSION N/A 11/25/2022   Procedure: ANTERIOR CERVICAL DISCECTOMY AND FUSION CERVICAL FIVE TO SEVEN;  Surgeon: Venita Lick, MD;  Location: [facility] OR;  Service: Orthopedics;  Laterality: N/A;   AUGMENTATION MAMMAPLASTY Bilateral 2002   BLADDER SUSPENSION  1981   CATARACT EXTRACTION W/ INTRAOCULAR LENS IMPLANT Bilateral    COLONOSCOPY     CORONARY PRESSURE/FFR STUDY N/A 11/04/2017   Procedure: INTRAVASCULAR PRESSURE WIRE/FFR STUDY;  Surgeon: Yvonne Kendall, MD;  Location: [facility] INVASIVE CV LAB;  Service: Cardiovascular;  Laterality: N/A;   CORONARY STENT INTERVENTION N/A 11/04/2017   Procedure: CORONARY STENT INTERVENTION;  Surgeon: Yvonne Kendall, MD;  Location: [facility] INVASIVE CV LAB;  Service: Cardiovascular;  Laterality: N/A;   CORONARY ULTRASOUND/IVUS N/A 11/04/2017   Procedure: Intravascular Ultrasound/IVUS;  Surgeon: Yvonne Kendall, MD;  Location: [facility] INVASIVE CV [facility];  Service: Cardiovascular;  Laterality: N/A;   ENDOVENOUS ABLATION SAPHENOUS VEIN W/ LASER Left 12/29/2019   endovenous laser ablation left greater saphenous vein by Fabienne Bruns MD    ESOPHAGOGASTRODUODENOSCOPY     KNEE ARTHROSCOPY Right 1998, [id number]   LAPAROSCOPIC CHOLECYSTECTOMY  2007   LEFT HEART CATH AND CORONARY ANGIOGRAPHY N/A 11/04/2017   Procedure: LEFT HEART CATH AND CORONARY ANGIOGRAPHY;  Surgeon: Yvonne Kendall, MD;  Location: [facility] INVAS[facility] CV [facility];  Service: Cardiovascular;  Laterality: N/A;   LEFT HEART CATHETERIZATION WITH CORONARY ANGIOGRAM N/A 11/21/2011   Procedure: LEFT HEART CATHETERIZATION WITH CORONARY ANGIOGRAM;  Surgeon: Wendall Stade, MD;  Location[facility] C[facility];  Service: Cardiovascular;  Laterality: N/A;   TONSILLECTOMY  1975   VAGINAL HYSTERECTOMY  1983   "still have my ovaries"   Family History  Problem Relation Age of Onset   Alzheimer's disease Mother     Diabetes type II Mother    Hiatal hernia Mother    Diabetes Mother    Emphysema Father    COPD Father    Depression Sister        suicide   Diabetes Sister    Diabetes Daughter    Colon cancer Neg Hx    Stomach cancer Neg Hx    Esophageal cancer Neg Hx    Rectal cancer Neg Hx    Allergies as of 08/27/2023       Reactions   Epinephrine Other (See Comments)   Pass out    Other    Other reaction(s): Myalgia   Statins Other (See Comments)   Myalgias: Simvastatin also caused back ache   Welchol [colesevelam Hcl]    myalgia   Amoxicillin Itching, Rash   Has patient had a PCN reaction causing immediate rash, facial/tongue/throat swelling, SOB or lightheadedness with hypotension: No Has patient had a PCN reaction causing severe rash involving mucus membranes or skin necrosis: No Has patient had a PCN reaction that required hospitalization: No Has patient had a PCN reaction occurring within the last 10 years: Yes If all of the above answers are "NO", then may proceed with Cephalosporin use.        Medication List        Accurate as of August 27, 2023  2:53 PM. If you have any questions, ask your nurse or doctor.          CALTRATE 600 PO Take 650 mg by mouth daily.   dabigatran 150 MG Caps capsule Commonly known as: Pradaxa Take 1 capsule (150 mg total) by mouth 2 (two)  times daily.   Eliquis 5 MG Tabs tablet Generic drug: apixaban TAKE 1 TABLET TWICE A DAY   ezetimibe 10 MG tablet Commonly known as: ZETIA Take 10 mg by mouth daily.   fenofibrate 160 MG tablet TAKE 1 TABLET DAILY   fexofenadine 180 MG tablet Commonly known as: ALLEGRA Take 180 mg by mouth daily.   isosorbide mononitrate 30 MG 24 hr tablet Commonly known as: IMDUR TAKE 1 TABLET DAILY   ketoconazole 2 % shampoo Commonly known as: NIZORAL Apply 1 Application topically 2 (two) times a week.   levothyroxine 100 MCG tablet Commonly known as: SYNTHROID Take 100 mcg by mouth daily before  breakfast.   levothyroxine 88 MCG tablet Commonly known as: SYNTHROID Take 88 mcg by mouth every morning.   Magnesium 250 MG Tabs Take 250 mg by mouth daily.   metFORMIN 500 MG 24 hr tablet Commonly known as: GLUCOPHAGE-XR Take 500 mg by mouth 2 (two) times daily.   metoprolol tartrate 25 MG tablet Commonly known as: LOPRESSOR TAKE 1 TABLET TWICE A DAY. TAKE AN EXTRA DOSE FOR BREAK THROUGH ATRIAL FIBRILLATION WITH INCREASED HEART RATE GREATER THAN 100.   nitroGLYCERIN 0.4 MG SL tablet Commonly known as: NITROSTAT Place 1 tablet (0.4 mg total) under the tongue every 5 (five) minutes as needed for chest pain.   NovoLIN 70/30 Kwikpen (70-30) 100 UNIT/ML KwikPen Generic drug: insulin isophane & regular human KwikPen Inject 14-25 Units into the skin See admin instructions. Take 24 mg in the morning 14 mg at lunch and 25 mg a bedtime   pantoprazole 40 MG tablet Commonly known as: PROTONIX Take 1 tablet (40 mg total) by mouth 2 (two) times daily before a meal.   pregabalin 50 MG capsule Commonly known as: LYRICA Take 1 capsule (50 mg total) by mouth 2 (two) times daily.   PROBIOTIC DAILY PO Take 1 tablet by mouth daily.   Refresh Optive Mega-3 0.5-1-0.5 % Soln Generic drug: Carboxymeth-Glyc-Polysorb PF Place 1 drop into both eyes 3 (three) times daily.   VESALIUS evolocumab (AMG 145) or placebo 140 mg/mL SQ injection Inject 1 mL (140 mg total) into the skin every 14 (fourteen) days. Return kit at next visit.If any questions or concerns occur regarding this medication, please contact [facility]. **For Investigational Drug Use Only**        All past medical history, surgical history, allergies, family history, immunizations and medications were updated in the EMR today and reviewed under the history and medication portions of their EMR.      ROS: Negative, with the exception of above mentioned in HPI   Objective:  BP 132/64   Pulse 74   Temp 98.1 F (36.7 C)    Wt 186 lb 11.2 oz (84.7 kg)   SpO2 97%   BMI 30.60 kg/m  Body mass index is 30.6 kg/m. Physical Exam Vitals and nursing note reviewed.  Constitutional:      General: She is not in acute distress.    Appearance: Normal appearance. She is normal weight. She is not ill-appearing or toxic-appearing.  HENT:     Head: Normocephalic and atraumatic.  Eyes:     General: No scleral icterus.       Right eye: No discharge.        Left eye: No discharge.     Extraocular Movements: Extraocular movements intact.     Conjunctiva/sclera: Conjunctivae normal.     Pupils: Pupils are equal, round, and reactive to light.  Musculoskeletal:  General: No swelling.  Skin:    Findings: No rash.  Neurological:     Mental Status: She is alert and oriented to person, place, and time. Mental status is at baseline.     Motor: No weakness.     Coordination: Coordination normal.     Gait: Gait normal.  Psychiatric:        Mood and Affect: Mood normal.        Behavior: Behavior normal.        Thought Content: Thought content normal.        Judgment: Judgment normal.      Assessment/Plan: [name] [name] is a 68 y.o. female present for OV for  Thoracic degenerative disc disease/bone spur/dextroscoliosis of thoracic spine/thoracic radiculopathy/neurosensory deficit/paresthesia/right thoracic back pain stable CT thoracic spine: Disc heights are relatively preserved.  Scattered mild anterior endplate spurring throughout the thoracic spine.  No high-grade spinal canal or neural foraminal stenosis [place] controlled substance database was reviewed and appropriate today Continue  Lyrica 50 mg twice daily prescribed.    Reviewed expectations re: course of current medical issues. Discussed self-management of symptoms. Outlined signs and symptoms indicating need for more acute intervention. Patient verbalized understanding and all questions were answered. Patient received an After-Visit  Summary. Any changes in medications were reviewed and patient was provided with updated med list with their AVS.   Return in about 24 weeks (around 02/11/2024) for Routine chronic condition follow-up.   Meds ordered this encounter  Medications   pregabalin (LYRICA) 50 MG capsule    Sig: Take 1 capsule (50 mg total) by mouth 2 (two) times daily.    Dispense:  180 capsule    Refill:  1    No orders of the defined types were placed in this encounter.    Note is dictated utilizing voice recognition software. Although note has been proof read prior to signing, occasional typographical errors still can be missed. If any questions arise, please do not hesitate to call for verification.   electronically signed by:  Felix Pacini, DO  [facility] - OR

## 2023-09-01 ENCOUNTER — Telehealth: Payer: Self-pay | Admitting: *Deleted

## 2023-09-01 NOTE — Telephone Encounter (Signed)
Spoke with pt, aware of dr hochrein's remarks.

## 2023-09-01 NOTE — Telephone Encounter (Signed)
-----   Message from Rollene Rotunda sent at 08/31/2023  7:34 PM EDT ----- Can we let the patient know please that we won't be making changes while she is in the study.  Thanks. ----- Message ----- From: Chrystie Nose, MD Sent: 08/29/2023   4:43 PM EDT To: Rollene Rotunda, MD; Elaina Pattee, RN  Leta Jungling-  Short answer is do not make changes to the lipid regimen - that would still be considered a protocol deviation.  We will advise you when she has completed the study and changes can be made.  -[place] ----- Message ----- From: Elaina Pattee, RN Sent: 08/29/2023  10:46 AM EDT To: Chrystie Nose, MD  Well we are suppose to be blinded to these results and they aren't suppose to be drawn.....  Study is ending end of Summer hopefully Julyish...  Md can always make changes it will prob be a PD but at this point what do we do.  I just don't think we should say anything about the LDL to Cristal and let it end.  These blinded studies are hard - when MDs order lipids on them  Sorry just now seeing this. ----- Message ----- From: Chrystie Nose, MD Sent: 08/27/2023   8:43 AM EDT To: Rollene Rotunda, MD; Elaina Pattee, RN  Cala Bradford-  Are we at a point with VESALIUS that we can alter treatments? This patient was enrolled.  Thanks.  -[place] ----- Message ----- From: Rollene Rotunda, MD Sent: 08/26/2023   1:52 PM EDT To: Chrystie Nose, MD  Hi,   This patient is in the VESALIUS .  Her LDL was 90.  Can you let me know if this addressed in the trial or can I do something?  Thanks.

## 2023-09-16 ENCOUNTER — Encounter: Admitting: *Deleted

## 2023-09-16 DIAGNOSIS — Z006 Encounter for examination for normal comparison and control in clinical research program: Secondary | ICD-10-CM

## 2023-09-16 MED ORDER — STUDY - VESALIUS - EVOLOCUMAB (AMG 145) 140 MG/ML OR PLACEBO SQ INJECTION (PI-HILTY): 140.0000 mg | INJECTION | SUBCUTANEOUS | 0 refills | Status: DC

## 2023-09-16 MED ORDER — STUDY - VESALIUS - EVOLOCUMAB (AMG 145) 140 MG/ML OR PLACEBO SQ INJECTION (PI-HILTY)
140.0000 mg | INJECTION | SUBCUTANEOUS | Status: AC
  Administered 2023-09-16: 140 mg via SUBCUTANEOUS
  Filled 2023-09-16: qty 1

## 2023-09-16 NOTE — Research (Addendum)
 [facility]  Week   Vitals: [x]  Experience any AE/SAE/Hospitalizations [x]   If yes please explain:   Non-Fatal Potential Endpoint Assessment Yes  No   Has the subject experienced/undergone any of the following since the last visit/contact?   []   [x]    Any Coronary Artery Revascularization/Cerebrovascular Revascularization/ Peripheral Artery Revascularization/Amputation Procedure   []   [x]    Myocardial Infarction []   [x]    Stroke   []   [x]    Provide the date for the non-fatal Potential Endpoints status:   []   [x]    IP admin please see MAR (please add Box # to comment section on [facility]) [x]   Patient given: Box #  [id number] - 3 IP given  Box # [id number] - 4 IP given   Patient forgot to bring back all the boxes and empty IP - she will bring them back  She has used all the IP and today is the day for her next injection.   09/22/2023 Subject brought back all her used IP  Box and used ip [id number] - 3 fully used [id number] - 4 fully used  Also brought back used IP from week 144  IP [id number] - the IP used she left at home when she came in for her visit

## 2023-09-21 ENCOUNTER — Other Ambulatory Visit: Payer: Self-pay | Admitting: Cardiology

## 2023-09-29 ENCOUNTER — Encounter: Payer: Self-pay | Admitting: Family Medicine

## 2023-09-29 ENCOUNTER — Ambulatory Visit

## 2023-09-29 ENCOUNTER — Other Ambulatory Visit: Payer: Self-pay | Admitting: Family Medicine

## 2023-09-29 ENCOUNTER — Telehealth: Payer: Self-pay | Admitting: Internal Medicine

## 2023-09-29 ENCOUNTER — Ambulatory Visit (INDEPENDENT_AMBULATORY_CARE_PROVIDER_SITE_OTHER): Admitting: Family Medicine

## 2023-09-29 VITALS — BP 134/78 | HR 72 | Temp 98.1°F | Wt 184.0 lb

## 2023-09-29 DIAGNOSIS — M25552 Pain in left hip: Secondary | ICD-10-CM

## 2023-09-29 MED ORDER — PREDNISONE 20 MG PO TABS: ORAL_TABLET | ORAL | 0 refills | Status: DC

## 2023-09-29 MED ORDER — PANTOPRAZOLE SODIUM 40 MG PO TBEC: 40.0000 mg | DELAYED_RELEASE_TABLET | Freq: Two times a day (BID) | ORAL | 0 refills | Status: DC

## 2023-09-29 NOTE — Progress Notes (Signed)
 [name] [name] , 06-Jul-1955, 68 y.o., female MRN: [id number] Patient Care Team    Relationship Specialty Notifications Start End  Mariel Shope, DO PCP - General Family Medicine  06/03/18   Eilleen Grates, MD PCP - Cardiology Cardiology Admissions 11/14/17   [name] Peacemaker, MD Consulting Physician Gastroenterology  06/05/18   Mannam, Praveen, MD Consulting Physician Pulmonary Disease  06/05/18   Tasia Farr, MD Referring Physician Endocrinology  09/07/20   Orvan Blanch, MD Consulting Physician Orthopedic Surgery  10/31/21   Sherell Dill, RN (Inactive) Registered Nurse   02/27/22     Chief Complaint  Patient presents with   Groin Pain    On and off for a few months; severe pain for the last 3 days. L sided groin pain. Has caused difficulty in mobility. Pt has not taken anything to relieve symptoms.      Subjective: [name] [name] is a 68 y.o. Pt presents for an OV with complaints of groin pain  of few months duration, worsened over last 3 days causing her to limp.  Associated symptoms include pian with flexing and abd/add leg, transitioning positions. . Unable to take NSAIDS d/t anticoagulation.  Pt has tried OTC to ease their symptoms.   Pt denies prior injury to joint. She states initial presentation a few months ago she did not have an injury either. Over the last week she has been working out the yard and around the house. She reports certain movements cause jint to catch, taking her breath away with pain     04/16/2023    3:43 PM 04/11/2022    2:16 PM 04/04/2021   11:48 AM 09/06/2020   11:25 AM 06/03/2018    4:19 PM  Depression screen PHQ 2/9  Decreased Interest 0 0 0 0 2  Down, Depressed, Hopeless 0 0 0 0 1  PHQ - 2 Score 0 0 0 0 3  Altered sleeping 0    0  Tired, decreased energy 0    2  Change in appetite 0    3  Feeling bad or failure about yourself  0    0  Trouble concentrating 0    0  Moving slowly or fidgety/restless 0    0  Suicidal thoughts 0    0   PHQ-9 Score 0    8  Difficult doing work/chores Not difficult at all        Allergies  Allergen Reactions   Epinephrine  Other (See Comments)    Pass out    Other     Other reaction(s): Myalgia   Statins Other (See Comments)    Myalgias: Simvastatin also caused back ache   Welchol  [Colesevelam  Hcl]     myalgia   Amoxicillin Itching and Rash    Has patient had a PCN reaction causing immediate rash, facial/tongue/throat swelling, SOB or lightheadedness with hypotension: No Has patient had a PCN reaction causing severe rash involving mucus membranes or skin necrosis: No Has patient had a PCN reaction that required hospitalization: No Has patient had a PCN reaction occurring within the last 10 years: Yes If all of the above answers are "NO", then may proceed with Cephalosporin use.    Social History   Social History Narrative   Marital status/children/pets: Married, 1 child.    Education/employment: HS   Safety:      -smoke alarm in the home:Yes     - wears seatbelt: Yes     -  Feels safe in their relationships: Yes   Past Medical History:  Diagnosis Date   Accelerating angina (HCC) 11/04/2017   Allergy     Asthma    Atrial fibrillation (HCC) 08/27/2020   Bone spur 10/11/2021   Cataract    Chronic kidney disease    Chronic pain disorder    neck and shoulder   Chronic pain of right upper extremity 11/10/2017   Coronary artery disease 2019   Stent x2   CTS (carpal tunnel syndrome) 01/27/2013   right   Diabetes mellitus type II    Diabetic gastroparesis (HCC)    Dysrhythmia    A. Fib   Esophageal reflux 08/25/2013   [name] [name], Dr Camilla Cedar   FATTY LIVER DISEASE 04/27/2007   Qualifier: Diagnosis of  By: Sanford Crumble CMA, Darlene     Focal muscle atrophy 09/26/2015   Fundic gland polyps of stomach, benign    Globus sensation 04/13/2018   Hordeolum externum of right upper eyelid 12/24/2021   HTN (hypertension)    Hx of adenomatous colonic polyps 12/10/2021    Hyperlipidemia, mixed 05/04/2007   Qualifier: Diagnosis of  By: Marthe Slain crestor  caused myalgias even at low dose Livalo  caused myalgias Lipitor  Mother with severe reaction myalgias Simvastatin, Welchol     Hypokalemia 05/25/2013   Improved stopping HCTZ. Was noted to have an elevated glucose when K was low. Recheck renal next week after starting Maxzide   Hypomagnesemia 08/10/2014   Hypothyroidism    Iron  deficiency anemia 08/07/2018   Iron  malabsorption 08/07/2018   Laryngitis 08/25/2017   Leg cramps 04/15/2016   Nausea without vomiting 08/08/2014   NONSPEC ELEVATION OF LEVELS OF TRANSAMINASE/LDH 05/01/2007   Qualifier: Diagnosis of  By: Robyn Christ     Osteoarthritis    chronic, right knee (11/04/2017)   Plantar fasciitis    Pneumonia    Rosacea 10/22/2016   Urine incontinence    Past Surgical History:  Procedure Laterality Date   ANTERIOR CERVICAL DECOMP/DISCECTOMY FUSION N/A 11/25/2022   Procedure: ANTERIOR CERVICAL DISCECTOMY AND FUSION CERVICAL FIVE TO SEVEN;  Surgeon: Mort Ards, MD;  Location: [facility] OR;  Service: Orthopedics;  Laterality: N/A;   AUGMENTATION MAMMAPLASTY Bilateral 2002   BLADDER SUSPENSION  1981   CATARACT EXTRACTION W/ INTRAOCULAR LENS IMPLANT Bilateral    COLONOSCOPY     CORONARY PRESSURE/FFR STUDY N/A 11/04/2017   Procedure: INTRAVASCULAR PRESSURE WIRE/FFR STUDY;  Surgeon: Sammy Crisp, MD;  Location: [facility] INVASIVE CV LAB;  Service: Cardiovascular;  Laterality: N/A;   CORONARY STENT INTERVENTION N/A 11/04/2017   Procedure: CORONARY STENT INTERVENTION;  Surgeon: Sammy Crisp, MD;  Location: [facility] INVASIVE CV LAB;  Service: Cardiovascular;  Laterality: N/A;   CORONARY ULTRASOUND/IVUS N/A 11/04/2017   Procedure: Intravascular Ultrasound/IVUS;  Surgeon: Sammy Crisp, MD;  Location: [facility] INV[facility] CV [facility];  Service: Cardiovascular;  Laterality: N/A;   ENDOVENOUS ABLATION SAPHENOUS VEIN W/ LASER Left 12/29/2019   endovenous laser ablation left  greater saphenous vein by Stacia Dynes MD    ESOPHAGOGASTRODUODENOSCOPY     KNEE ARTHROSCOPY Right 1998, [id number],[id number]   LAPAROSCOPIC CHOLECYSTECTOMY  2007   LEFT HEART CATH AND CORONARY ANGIOGRAPHY N/A 11/04/2017   Procedure: LEFT HEART CATH AND CORONARY ANGIOGRAPHY;  Surgeon: Sammy Crisp, MD;  Location: [facility] INVAS[facility] CV [facility];  Service: Cardiovascular;  Laterality: N/A;   LEFT HEART CATHETERIZATION WITH CORONARY ANGIOGRAM N/A 11/21/2011   Procedure: LEFT HEART CATHETERIZATION WITH CORONARY ANGIOGRAM;  Surgeon: Loyde Rule, MD;  Location: [facility] CATH LAB;  Service: Cardiovascular;  Laterality: N/A;   TONSILLECTOMY  1975   VAGINAL HYSTERECTOMY  1983   "still have my ovaries"   Family History  Problem Relation Age of Onset   Alzheimer's disease Mother    Diabetes type II Mother    Hiatal hernia Mother    Diabetes Mother    Emphysema Father    COPD Father    Depression Sister        suicide   Diabetes Sister    Diabetes Daughter    Colon cancer Neg Hx    Stomach cancer Neg Hx    Esophageal cancer Neg Hx    Rectal cancer Neg Hx    Allergies as of 09/29/2023       Reactions   Epinephrine  Other (See Comments)   Pass out    Other    Other reaction(s): Myalgia   Statins Other (See Comments)   Myalgias: Simvastatin also caused back ache   Welchol  [colesevelam  Hcl]    myalgia   Amoxicillin Itching, Rash   Has patient had a PCN reaction causing immediate rash, facial/tongue/throat swelling, SOB or lightheadedness with hypotension: No Has patient had a PCN reaction causing severe rash involving mucus membranes or skin necrosis: No Has patient had a PCN reaction that required hospitalization: No Has patient had a PCN reaction occurring within the last 10 years: Yes If all of the above answers are "NO", then may proceed with Cephalosporin use.        Medication List        Accurate as of Sep 29, 2023 10:13 AM. If you have any questions, ask your nurse or doctor.           CALTRATE 600 PO Take 650 mg by mouth daily.   dabigatran  150 MG Caps capsule Commonly known as: Pradaxa  Take 1 capsule (150 mg total) by mouth 2 (two) times daily.   ezetimibe  10 MG tablet Commonly known as: ZETIA  Take 10 mg by mouth daily.   fenofibrate  160 MG tablet TAKE 1 TABLET DAILY   fexofenadine 180 MG tablet Commonly known as: ALLEGRA Take 180 mg by mouth daily.   isosorbide  mononitrate 30 MG 24 hr tablet Commonly known as: IMDUR  TAKE 1 TABLET DAILY   ketoconazole 2 % shampoo Commonly known as: NIZORAL Apply 1 Application topically 2 (two) times a week.   levothyroxine  100 MCG tablet Commonly known as: SYNTHROID  Take 100 mcg by mouth daily before breakfast.   Magnesium  250 MG Tabs Take 250 mg by mouth daily.   metFORMIN  500 MG 24 hr tablet Commonly known as: GLUCOPHAGE -XR Take 500 mg by mouth 2 (two) times daily.   metoprolol  tartrate 25 MG tablet Commonly known as: LOPRESSOR  TAKE 1 TABLET TWICE A DAY. TAKE AN EXTRA DOSE FOR BREAK THROUGH ATRIAL FIBRILLATION WITH INCREASED HEART RATE GREATER THAN 100.   nitroGLYCERIN  0.4 MG SL tablet Commonly known as: NITROSTAT  Place 1 tablet (0.4 mg total) under the tongue every 5 (five) minutes as needed for chest pain.   NovoLIN 70/30 Kwikpen (70-30) 100 UNIT/ML KwikPen Generic drug: insulin  isophane & regular human KwikPen Inject 14-25 Units into the skin See admin instructions. Take 24 mg in the morning 14 mg at lunch and 25 mg a bedtime   pantoprazole  40 MG tablet Commonly known as: PROTONIX  Take 1 tablet (40 mg total) by mouth 2 (two) times daily before a meal.   predniSONE  20 MG tablet Commonly known as: DELTASONE  60 mg x3d, 40 mg x3d, 20 mg x2d, 10 mg  x2d   pregabalin  50 MG capsule Commonly known as: LYRICA  Take 1 capsule (50 mg total) by mouth 2 (two) times daily.   PROBIOTIC DAILY PO Take 1 tablet by mouth daily.   Refresh Optive Mega-3 0.5-1-0.5 % Soln Generic drug: Carboxymeth-Glyc-Polysorb  PF Place 1 drop into both eyes 3 (three) times daily.   VESALIUS evolocumab  (AMG 145) or placebo 140 mg/mL SQ injection Inject 1 mL (140 mg total) into the skin every 14 (fourteen) days. Return kit at next visit.If any questions or concerns occur regarding this medication, please contact [facility]. **For Investigational Drug Use Only**        All past medical history, surgical history, allergies, family history, immunizations andmedications were updated in the EMR today and reviewed under the history and medication portions of their EMR.     ROS Negative, with the exception of above mentioned in HPI   Objective:  BP 134/78   Pulse 72   Temp 98.1 F (36.7 C)   Wt 184 lb (83.5 kg)   SpO2 98%   BMI 30.15 kg/m  Body mass index is 30.15 kg/m.  Physical Exam Vitals and nursing note reviewed.  Constitutional:      General: She is not in acute distress.    Appearance: Normal appearance. She is normal weight. She is not ill-appearing or toxic-appearing.  HENT:     Head: Normocephalic and atraumatic.  Eyes:     General: No scleral icterus.       Right eye: No discharge.        Left eye: No discharge.     Extraocular Movements: Extraocular movements intact.     Conjunctiva/sclera: Conjunctivae normal.     Pupils: Pupils are equal, round, and reactive to light.  Musculoskeletal:        General: Tenderness present.     Right hip: Normal.     Left hip: Tenderness present. Decreased range of motion. Decreased strength.       Legs:     Comments: Left hip: mild TTP. Neg SLR. + FABRE ant hip, pain with internal rotation of hip. MS 5/5 b/l LE. NV intact distally.   Skin:    Findings: No rash.  Neurological:     Mental Status: She is alert and oriented to person, place, and time. Mental status is at baseline.     Motor: No weakness.     Coordination: Coordination normal.     Gait: Gait normal.  Psychiatric:        Mood and Affect: Mood normal.        Behavior: Behavior  normal.        Thought Content: Thought content normal.        Judgment: Judgment normal.      No results found. No results found. No results found for this or any previous visit (from the past 24 hours).  Assessment/Plan: ELODY GERHOLD is a 68 y.o. female present for OV for  Hip pain, acute, left (Primary) - DG Hip Unilat W OR W/O Pelvis Min 4 Views Left; Future Prednisone  taper prescribed.  Offered tramdol- pt declined.  Nsaids contraindicated on anticoag.  Discussed Ddx: moderate to severe arthritis with flare vs labral tear vs other hip etiology.   F/u dependent upon image results.   Reviewed expectations re: course of current medical issues. Discussed self-management of symptoms. Outlined signs and symptoms indicating need for more acute intervention. Patient verbalized understanding and all questions were answered. Patient received an After-Visit Summary.  Orders Placed This Encounter  Procedures   DG Hip Unilat W OR W/O Pelvis Min 4 Views Left   Meds ordered this encounter  Medications   predniSONE  (DELTASONE ) 20 MG tablet    Sig: 60 mg x3d, 40 mg x3d, 20 mg x2d, 10 mg x2d    Dispense:  18 tablet    Refill:  0   Referral Orders  No referral(s) requested today     Note is dictated utilizing voice recognition software. Although note has been proof read prior to signing, occasional typographical errors still can be missed. If any questions arise, please do not hesitate to call for verification.   electronically signed by:  Napolean Backbone, DO  [facility] - [place]

## 2023-09-29 NOTE — Telephone Encounter (Signed)
 I called [name] [name] and confirmed her pharmacy. I told her to please call back to get an appointment before she runs out as we don't have any [facility] appointments open currently. She is going to mark her calendar. I refilled the pantoprazole  as requested to her mail order pharmacy.

## 2023-09-29 NOTE — Telephone Encounter (Signed)
 Patient is requesting a refill for pantoprazole  to be sent to her pharmacy. Please advise, thank you.

## 2023-10-06 ENCOUNTER — Encounter: Payer: Self-pay | Admitting: Family Medicine

## 2023-10-06 NOTE — Telephone Encounter (Signed)
Pt scheduled for 5-13

## 2023-10-06 NOTE — Telephone Encounter (Signed)
 Please call patient Please encourage patient to make an appointment if she is not seeing improvement in hip pain with rest and the prednisone .  Will likely proceed with further evaluation and more advanced imaging of the hip/groin area.  The x-ray was read as normal, that does not mean the entire joint was normal and it means the bony aspects of the joint appeared normal.  If she has a tear in the cartilage of the joint that would not show on x-ray.

## 2023-10-07 ENCOUNTER — Ambulatory Visit: Admitting: Family Medicine

## 2023-10-08 ENCOUNTER — Ambulatory Visit: Admitting: Family Medicine

## 2023-10-10 ENCOUNTER — Ambulatory Visit (INDEPENDENT_AMBULATORY_CARE_PROVIDER_SITE_OTHER): Admitting: Family Medicine

## 2023-10-10 DIAGNOSIS — M25552 Pain in left hip: Secondary | ICD-10-CM

## 2023-10-10 NOTE — Progress Notes (Signed)
   Same day cancel

## 2023-10-21 ENCOUNTER — Encounter: Payer: Self-pay | Admitting: Family Medicine

## 2023-10-21 ENCOUNTER — Ambulatory Visit: Admitting: Family Medicine

## 2023-10-21 VITALS — BP 118/66 | HR 65 | Temp 97.9°F | Wt 183.0 lb

## 2023-10-21 DIAGNOSIS — M6281 Muscle weakness (generalized): Secondary | ICD-10-CM

## 2023-10-21 DIAGNOSIS — R195 Other fecal abnormalities: Secondary | ICD-10-CM

## 2023-10-21 DIAGNOSIS — E41 Nutritional marasmus: Secondary | ICD-10-CM | POA: Diagnosis not present

## 2023-10-21 DIAGNOSIS — K909 Intestinal malabsorption, unspecified: Secondary | ICD-10-CM

## 2023-10-21 DIAGNOSIS — R5383 Other fatigue: Secondary | ICD-10-CM | POA: Diagnosis not present

## 2023-10-21 DIAGNOSIS — R197 Diarrhea, unspecified: Secondary | ICD-10-CM

## 2023-10-21 DIAGNOSIS — M25552 Pain in left hip: Secondary | ICD-10-CM

## 2023-10-21 LAB — CBC WITH DIFFERENTIAL/PLATELET
Basophils Absolute: 0 10*3/uL (ref 0.0–0.1)
Basophils Relative: 0.7 % (ref 0.0–3.0)
Eosinophils Absolute: 0.1 10*3/uL (ref 0.0–0.7)
Eosinophils Relative: 2.6 % (ref 0.0–5.0)
HCT: 35.3 % — ABNORMAL LOW (ref 36.0–46.0)
Hemoglobin: 11.7 g/dL — ABNORMAL LOW (ref 12.0–15.0)
Lymphocytes Relative: 27.7 % (ref 12.0–46.0)
Lymphs Abs: 1.5 10*3/uL (ref 0.7–4.0)
MCHC: 33.1 g/dL (ref 30.0–36.0)
MCV: 82.2 fl (ref 78.0–100.0)
Monocytes Absolute: 0.4 10*3/uL (ref 0.1–1.0)
Monocytes Relative: 7.3 % (ref 3.0–12.0)
Neutro Abs: 3.4 10*3/uL (ref 1.4–7.7)
Neutrophils Relative %: 61.7 % (ref 43.0–77.0)
Platelets: 243 10*3/uL (ref 150.0–400.0)
RBC: 4.29 Mil/uL (ref 3.87–5.11)
RDW: 14.1 % (ref 11.5–15.5)
WBC: 5.5 10*3/uL (ref 4.0–10.5)

## 2023-10-21 LAB — COMPREHENSIVE METABOLIC PANEL WITH GFR
ALT: 19 U/L (ref 0–35)
AST: 18 U/L (ref 0–37)
Albumin: 3.7 g/dL (ref 3.5–5.2)
Alkaline Phosphatase: 36 U/L — ABNORMAL LOW (ref 39–117)
BUN: 14 mg/dL (ref 6–23)
CO2: 28 meq/L (ref 19–32)
Calcium: 9.3 mg/dL (ref 8.4–10.5)
Chloride: 103 meq/L (ref 96–112)
Creatinine, Ser: 0.89 mg/dL (ref 0.40–1.20)
GFR: 66.32 mL/min (ref >60.00)
Glucose, Bld: 167 mg/dL — ABNORMAL HIGH (ref 70–99)
Potassium: 4.6 meq/L (ref 3.5–5.1)
Sodium: 140 meq/L (ref 135–145)
Total Bilirubin: 0.4 mg/dL (ref 0.2–1.2)
Total Protein: 6.1 g/dL (ref 6.0–8.3)

## 2023-10-21 LAB — IBC + FERRITIN
Ferritin: 47 ng/mL (ref 10.0–291.0)
Iron: 83 ug/dL (ref 42–145)
Saturation Ratios: 19.6 % — ABNORMAL LOW (ref 20.0–50.0)
TIBC: 422.8 ug/dL (ref 250.0–450.0)
Transferrin: 302 mg/dL (ref 212.0–360.0)

## 2023-10-21 LAB — B12 AND FOLATE PANEL
Folate: 8.9 ng/mL (ref >5.9)
Vitamin B-12: 584 pg/mL (ref 211–911)

## 2023-10-21 LAB — VITAMIN D 25 HYDROXY (VIT D DEFICIENCY, FRACTURES): VITD: 28.65 ng/mL — ABNORMAL LOW (ref 30.00–100.00)

## 2023-10-21 NOTE — Progress Notes (Signed)
 [name] [name] , 04-30-1956, 68 y.o., female MRN: [id number] Patient Care Team    Relationship Specialty Notifications Start End  Mariel Shope, DO PCP - General Family Medicine  06/03/18   Eilleen Grates, MD PCP - Cardiology Cardiology Admissions 11/14/17   Kenney Peacemaker, MD Consulting Physician Gastroenterology  06/05/18   Mannam, Praveen, MD Consulting Physician Pulmonary Disease  06/05/18   Tasia Farr, MD Referring Physician Endocrinology  09/07/20   Orvan Blanch, MD Consulting Physician Orthopedic Surgery  10/31/21   Sherell Dill, RN (Inactive) Registered Nurse   02/27/22     Chief Complaint  Patient presents with   Hospitalization Follow-up    ED F/U. Denies anymore nausea, vomiting, or diarrhea. Pt c/o ongoing fatigue.      Subjective:  [name] [name]  is a 69 y.o. female presents for hospital follow up after recent ED visit for nausea, vomiting and diarrhea.  Patient states approximately 4 hours after consuming a [place] sandwich with mayonnaise from a restaurant, she endorsed having multiple episodes of vomiting and diarrhea up to a few times an hour for approximately 12-13 hours.  She was seen in the emergency room and provided with antinausea medication and hydration.  Patient reports she did have dark-colored stools and vomiting.  She endorses continued fatigue, feels like she has to continue to nap in order to get some energy.  Denies any continued fevers or chills.  Stools have returned to normal.  Left hip pain: Patient was seen 3 weeks ago for left hip pain.  X-ray was reassuring.  She states she responded well to the steroid taper for a few days, and then she experienced a sharp pain with the leg movement and pain has continued to recur.   DG HIP UNILAT WITH PELVIS 2-3 VIEWS LEFT Result Date: 09/29/2023 CLINICAL DATA:  Hip pain EXAM: DG HIP (WITH OR WITHOUT PELVIS) 2-3V LEFT COMPARISON:  None Available. FINDINGS: There is no evidence of hip fracture or  dislocation. There is no evidence of arthropathy or other focal bone abnormality. IMPRESSION: Negative. Electronically Signed   By: Fredrich Jefferson M.D.   On: 09/29/2023 12:03        04/16/2023    3:43 PM 04/11/2022    2:16 PM 04/04/2021   11:48 AM 09/06/2020   11:25 AM 06/03/2018    4:19 PM  Depression screen PHQ 2/9  Decreased Interest 0 0 0 0 2  Down, Depressed, Hopeless 0 0 0 0 1  PHQ - 2 Score 0 0 0 0 3  Altered sleeping 0    0  Tired, decreased energy 0    2  Change in appetite 0    3  Feeling bad or failure about yourself  0    0  Trouble concentrating 0    0  Moving slowly or fidgety/restless 0    0  Suicidal thoughts 0    0  PHQ-9 Score 0    8  Difficult doing work/chores Not difficult at all        Allergies  Allergen Reactions   Epinephrine  Other (See Comments)    Pass out    Other     Other reaction(s): Myalgia   Statins Other (See Comments)    Myalgias: Simvastatin also caused back ache   Welchol  [Colesevelam  Hcl]     myalgia   Amoxicillin Itching and Rash    Has patient had a PCN reaction causing immediate rash, facial/tongue/throat swelling, SOB  or lightheadedness with hypotension: No Has patient had a PCN reaction causing severe rash involving mucus membranes or skin necrosis: No Has patient had a PCN reaction that required hospitalization: No Has patient had a PCN reaction occurring within the last 10 years: Yes If all of the above answers are "NO", then may proceed with Cephalosporin use.    Social History   Tobacco Use   Smoking status: Never    Passive exposure: Never   Smokeless tobacco: Never  Substance Use Topics   Alcohol use: Not Currently   Past Medical History:  Diagnosis Date   Accelerating angina (HCC) 11/04/2017   Allergy     Asthma    Atrial fibrillation (HCC) 08/27/2020   Bone spur 10/11/2021   Cataract    Chronic kidney disease    Chronic pain disorder    neck and shoulder   Chronic pain of right upper extremity 11/10/2017    Coronary artery disease 2019   Stent x2   CTS (carpal tunnel syndrome) 01/27/2013   right   Diabetes mellitus type II    Diabetic gastroparesis (HCC)    Dysrhythmia    A. Fib   Esophageal reflux 08/25/2013   [facility] [place], Dr Camilla Cedar   FATTY LIVER DISEASE 04/27/2007   Qualifier: Diagnosis of  By: Sanford Crumble CMA, Darlene     Focal muscle atrophy 09/26/2015   Fundic gland polyps of stomach, benign    Globus sensation 04/13/2018   Hordeolum externum of right upper eyelid 12/24/2021   HTN (hypertension)    Hx of adenomatous colonic polyps 12/10/2021   Hyperlipidemia, mixed 05/04/2007   Qualifier: Diagnosis of  By: Marthe Slain crestor  caused myalgias even at low dose Livalo  caused myalgias Lipitor  Mother with severe reaction myalgias Simvastatin, Welchol     Hypokalemia 05/25/2013   Improved stopping HCTZ. Was noted to have an elevated glucose when K was low. Recheck renal next week after starting Maxzide   Hypomagnesemia 08/10/2014   Hypothyroidism    Iron  deficiency anemia 08/07/2018   Iron  malabsorption 08/07/2018   Laryngitis 08/25/2017   Leg cramps 04/15/2016   Nausea without vomiting 08/08/2014   NONSPEC ELEVATION OF LEVELS OF TRANSAMINASE/LDH 05/01/2007   Qualifier: Diagnosis of  By: Robyn Christ     Osteoarthritis    chronic, right knee (11/04/2017)   Plantar fasciitis    Pneumonia    Rosacea 10/22/2016   Urine incontinence    Past Surgical History:  Procedure Laterality Date   ANTERIOR CERVICAL DECOMP/DISCECTOMY FUSION N/A 11/25/2022   Procedure: ANTERIOR CERVICAL DISCECTOMY AND FUSION CERVICAL FIVE TO SEVEN;  Surgeon: Mort Ards, MD;  Location: [facility] OR;  Service: Orthopedics;  Laterality: N/A;   AUGMENTATION MAMMAPLASTY Bilateral 2002   BLADDER SUSPENSION  1981   CATARACT EXTRACTION W/ INTRAOCULAR LENS IMPLANT Bilateral    COLONOSCOPY     CORONARY PRESSURE/FFR STUDY N/A 11/04/2017   Procedure: INTRAVASCULAR PRESSURE WIRE/FFR STUDY;  Surgeon: Sammy Crisp, MD;  Location: [facility] INVASIVE CV LAB;  Service: Cardiovascular;  Laterality: N/A;   CORONARY STENT INTERVENTION N/A 11/04/2017   Procedure: CORONARY STENT INTERVENTION;  Surgeon: Sammy Crisp, MD;  Location: [facility] INVASIVE CV LAB;  Service: Cardiovascular;  Laterality: N/A;   CORONARY ULTRASOUND/IVUS N/A 11/04/2017   Procedure: Intravascular Ultrasound/IVUS;  Surgeon: Sammy Crisp, MD;  Location: [facility] INVASIVE CV [facility];  Service: Cardiovascular;  Laterality: N/A;   ENDOVENOUS ABLATION SAPHENOUS VEIN W/ LASER Left 12/29/2019   endovenous laser ablation left greater saphenous vein by Stacia Dynes MD  ESOPHAGOGASTRODUODENOSCOPY     KNEE ARTHROSCOPY Right 1998, [id number]   LAPAROSCOPIC CHOLECYSTECTOMY  2007   LEFT HEART CATH AND CORONARY ANGIOGRAPHY N/A 11/04/2017   Procedure: LEFT HEART CATH AND CORONARY ANGIOGRAPHY;  Surgeon: Sammy Crisp, MD;  Location: [facility] INVASIVE CV [facility];  Service: Cardiovascular;  Laterality: N/A;   LEFT HEART CATHETERIZATION WITH CORONARY ANGIOGRAM N/A 11/21/2011   Procedure: LEFT HEART CATHETERIZATION WITH CORONARY ANGIOGRAM;  Surgeon: Loyde Rule, MD;  Location: [facility] C[facility];  Service: Cardiovascular;  Laterality: N/A;   TONSILLECTOMY  1975   VAGINAL HYSTERECTOMY  1983   "still have my ovaries"   Family History  Problem Relation Age of Onset   Alzheimer's disease Mother    Diabetes type II Mother    Hiatal hernia Mother    Diabetes Mother    Emphysema Father    COPD Father    Depression Sister        suicide   Diabetes Sister    Diabetes Daughter    Colon cancer Neg Hx    Stomach cancer Neg Hx    Esophageal cancer Neg Hx    Rectal cancer Neg Hx    Allergies as of 10/21/2023       Reactions   Epinephrine  Other (See Comments)   Pass out    Other    Other reaction(s): Myalgia   Statins Other (See Comments)   Myalgias: Simvastatin also caused back ache   Welchol  [colesevelam  Hcl]    myalgia   Amoxicillin Itching, Rash   Has patient  had a PCN reaction causing immediate rash, facial/tongue/throat swelling, SOB or lightheadedness with hypotension: No Has patient had a PCN reaction causing severe rash involving mucus membranes or skin necrosis: No Has patient had a PCN reaction that required hospitalization: No Has patient had a PCN reaction occurring within the last 10 years: Yes If all of the above answers are "NO", then may proceed with Cephalosporin use.        Medication List        Accurate as of Oct 21, 2023 10:46 AM. If you have any questions, ask your nurse or doctor.          STOP taking these medications    predniSONE  20 MG tablet Commonly known as: DELTASONE  Stopped by: Napolean Backbone       TAKE these medications    CALTRATE 600 PO Take 650 mg by mouth daily.   dabigatran  150 MG Caps capsule Commonly known as: Pradaxa  Take 1 capsule (150 mg total) by mouth 2 (two) times daily.   ezetimibe  10 MG tablet Commonly known as: ZETIA  Take 10 mg by mouth daily.   fenofibrate  160 MG tablet TAKE 1 TABLET DAILY   fexofenadine 180 MG tablet Commonly known as: ALLEGRA Take 180 mg by mouth daily.   isosorbide  mononitrate 30 MG 24 hr tablet Commonly known as: IMDUR  TAKE 1 TABLET DAILY   ketoconazole 2 % shampoo Commonly known as: NIZORAL Apply 1 Application topically 2 (two) times a week.   levothyroxine  100 MCG tablet Commonly known as: SYNTHROID  Take 100 mcg by mouth daily before breakfast.   Magnesium  250 MG Tabs Take 250 mg by mouth daily.   metFORMIN  500 MG 24 hr tablet Commonly known as: GLUCOPHAGE -XR Take 500 mg by mouth 2 (two) times daily.   metoprolol  tartrate 25 MG tablet Commonly known as: LOPRESSOR  TAKE 1 TABLET TWICE A DAY. TAKE AN EXTRA DOSE FOR BREAK THROUGH ATRIAL FIBRILLATION WITH INCREASED HEART RATE GREATER THAN  100.   nitroGLYCERIN  0.4 MG SL tablet Commonly known as: NITROSTAT  Place 1 tablet (0.4 mg total) under the tongue every 5 (five) minutes as needed for  chest pain.   NovoLIN 70/30 Kwikpen (70-30) 100 UNIT/ML KwikPen Generic drug: insulin  isophane & regular human KwikPen Inject 14-25 Units into the skin See admin instructions. Take 24 mg in the morning 14 mg at lunch and 25 mg a bedtime   pantoprazole  40 MG tablet Commonly known as: PROTONIX  Take 1 tablet (40 mg total) by mouth 2 (two) times daily before a meal.   pregabalin  50 MG capsule Commonly known as: LYRICA  Take 1 capsule (50 mg total) by mouth 2 (two) times daily.   PROBIOTIC DAILY PO Take 1 tablet by mouth daily.   Refresh Optive Mega-3 0.5-1-0.5 % Soln Generic drug: Carboxymeth-Glyc-Polysorb PF Place 1 drop into both eyes 3 (three) times daily.   VESALIUS evolocumab  (AMG 145) or placebo 140 mg/mL SQ injection Inject 1 mL (140 mg total) into the skin every 14 (fourteen) days. Return kit at next visit.If any questions or concerns occur regarding this medication, please contact [facility]. **For Investigational Drug Use Only**        All past medical history, surgical history, allergies, family history, immunizations and medications were updated in the EMR today and reviewed under the history and medication portions of their EMR.      ROS: Negative, with the exception of above mentioned in HPI   Objective:  BP 118/66   Pulse 65   Temp 97.9 F (36.6 C)   Wt 183 lb (83 kg)   SpO2 98%   BMI 29.99 kg/m  Body mass index is 29.99 kg/m. Physical Exam Vitals and nursing note reviewed.  Constitutional:      General: She is not in acute distress.    Appearance: Normal appearance. She is not ill-appearing, toxic-appearing or diaphoretic.  HENT:     Head: Normocephalic and atraumatic.  Eyes:     General: No scleral icterus.       Right eye: No discharge.        Left eye: No discharge.     Extraocular Movements: Extraocular movements intact.     Conjunctiva/sclera: Conjunctivae normal.     Pupils: Pupils are equal, round, and reactive to light.   Cardiovascular:     Rate and Rhythm: Normal rate and regular rhythm.  Pulmonary:     Effort: Pulmonary effort is normal. No respiratory distress.     Breath sounds: Normal breath sounds. No wheezing, rhonchi or rales.  Abdominal:     General: Bowel sounds are normal. There is no distension.     Palpations: Abdomen is soft.     Tenderness: There is no abdominal tenderness. There is no guarding or rebound.  Musculoskeletal:     Right lower leg: No edema.     Left lower leg: No edema.  Skin:    General: Skin is warm.     Findings: No rash.  Neurological:     Mental Status: She is alert and oriented to person, place, and time. Mental status is at baseline.     Motor: No weakness.     Gait: Gait normal.  Psychiatric:        Mood and Affect: Mood normal.        Behavior: Behavior normal.        Thought Content: Thought content normal.        Judgment: Judgment normal.  Assessment/Plan: KINZLEIGH KANDLER is a 68 y.o. female present for OV for Hospital discharge follow up Muscle weakness/fatigue (Primary)/diarrhea-vomiting-food poisoning suspected We discussed continuing adequate hydration with water and electrolyte replacement drinks.  Will workup labs today for vitamin deficiencies, anemia, iron  deficiency and electrolyte balance to because of her ongoing fatigue She is tolerating p.o. diet and bowel habits have returned to normal. - Comp Met (CMET) - Vitamin D  (25 hydroxy) - B12 and Folate Panel - IBC + Ferritin - CBC w/Diff  Dark stools/iron  malabsorption history - IBC + Ferritin - CBC w/Diff  Hip pain, acute, left X-ray did not show any acute changes.  Patient reports she felt better on prednisone  for a few days and then she endorses a sharp with twisting of her leg pain recurred.  We discussed possible labral tear and referral to orthopedics today. She would like referral to [facility] orthopedics, she has been established in the past.  Reviewed expectations re:  course of current medical issues. Discussed self-management of symptoms. Outlined signs and symptoms indicating need for more acute intervention. Patient verbalized understanding and all questions were answered. Patient received an After-Visit Summary. Any changes in medications were reviewed and patient was provided with updated med list with their AVS.     Orders Placed This Encounter  Procedures   Comp Met (CMET)   Vitamin D  (25 hydroxy)   B12 and Folate Panel   IBC + Ferritin   CBC w/Diff   Ambulatory referral to Orthopedic Surgery     Note is dictated utilizing voice recognition software. Although note has been proof read prior to signing, occasional typographical errors still can be missed. If any questions arise, please do not hesitate to call for verification.   electronically signed by:  Napolean Backbone, DO  [facility] - OR

## 2023-10-21 NOTE — Patient Instructions (Addendum)

## 2023-10-22 ENCOUNTER — Ambulatory Visit: Payer: Self-pay | Admitting: Family Medicine

## 2023-10-27 ENCOUNTER — Other Ambulatory Visit: Payer: Self-pay

## 2023-10-27 MED ORDER — ISOSORBIDE MONONITRATE ER 30 MG PO TB24: 30.0000 mg | ORAL_TABLET | Freq: Every day | ORAL | 3 refills | Status: DC

## 2023-10-30 ENCOUNTER — Encounter: Payer: Self-pay | Admitting: Family Medicine

## 2023-10-30 ENCOUNTER — Telehealth: Payer: Self-pay

## 2023-10-30 NOTE — Telephone Encounter (Signed)
 Communication  Reason for CRM: Patient called in regarding orthopedic referral stated she has an appointment scheduled but they need the Cd of her xrays, wanted to know if that can be sent or if she can stop by tomorrow to pick it up        [phone]   Refer to MyChart Msg.

## 2023-11-03 ENCOUNTER — Encounter: Payer: Self-pay | Admitting: Family Medicine

## 2023-11-03 DIAGNOSIS — M25852 Other specified joint disorders, left hip: Secondary | ICD-10-CM | POA: Insufficient documentation

## 2023-11-06 ENCOUNTER — Telehealth: Payer: Self-pay | Admitting: Cardiology

## 2023-11-06 DIAGNOSIS — I480 Paroxysmal atrial fibrillation: Secondary | ICD-10-CM

## 2023-11-06 NOTE — Telephone Encounter (Signed)
 Called patient back. Patient stated ever since she was admitted for food poisoning last month she has been dealing with on and off A. Fib episodes three times a day. Patient stated it has been getting worse. Will see if patient can be referred to [facility] or get added into Dr. Atlas Lea schedule.

## 2023-11-06 NOTE — Telephone Encounter (Signed)
 Patient c/o Palpitations:  STAT if patient reporting lightheadedness, shortness of breath, or chest pain  How long have you had palpitations/irregular HR/ Afib? Are you having the symptoms now? In Afib multiple times a day- not at this exact time  Are you currently experiencing lightheadedness, SOB or CP? Chest discomfort at times  Do you have a history of afib (atrial fibrillation) or irregular heart rhythm?   Have you checked your BP or HR? (document readings if available):  have not checked  Are you experiencing any other symptoms?  Dry cough and catching her breath , when in Afib- patient would like to be seen asap

## 2023-11-07 NOTE — Telephone Encounter (Signed)
 Eilleen Grates, MD to [name], Dina Francisco, RN      11/06/23  7:55 PM It looks like I have one open slot tomorrow morning but it is early.  If she cannot come to this she should be scheduled for [facility].   Slot was already filled. Placed order for referral to [facility].  Called patient and informed her that [facility] will call her to schedule an appointment.   Gave ER precautions and she verbalized understanding of al information.

## 2023-11-12 ENCOUNTER — Ambulatory Visit (HOSPITAL_COMMUNITY)
Admission: RE | Admit: 2023-11-12 | Discharge: 2023-11-12 | Disposition: A | Discharge status: Home / Self Care | Source: Ambulatory Visit | Attending: Physician Assistant | Admitting: Physician Assistant

## 2023-11-12 ENCOUNTER — Encounter (HOSPITAL_COMMUNITY): Payer: Self-pay | Admitting: Physician Assistant

## 2023-11-12 VITALS — BP 148/74 | HR 75 | Ht 65.5 in | Wt 182.0 lb

## 2023-11-12 DIAGNOSIS — D6869 Other thrombophilia: Secondary | ICD-10-CM | POA: Insufficient documentation

## 2023-11-12 DIAGNOSIS — I480 Paroxysmal atrial fibrillation: Secondary | ICD-10-CM | POA: Diagnosis not present

## 2023-11-12 MED ORDER — METOPROLOL TARTRATE 50 MG PO TABS: 50.0000 mg | ORAL_TABLET | Freq: Two times a day (BID) | ORAL | 2 refills | Status: DC

## 2023-11-12 NOTE — Patient Instructions (Signed)
Increase metoprolol to 50mg twice a day 

## 2023-11-12 NOTE — Progress Notes (Signed)
 Primary Care Physician: Mariel Shope, DO Primary Cardiologist: Eilleen Grates, MD Electrophysiologist: None  Referring Physician: Dr Lucille Saas is a 68 y.o. female with a history of CAD, HTN, DM, HLD, atrial fibrillation who presents for follow up in the [facility].  The patient was initially diagnosed with atrial fibrillation 08/2020 after presenting to the ED with symptoms of chest and jaw pain. Durin that admission she went into rapid afib with symptoms of palpitations, fatigue, and chest pain. Zio patch after discharge did not show any recurrent afib. Patient is on Pradaxa  for stroke prevention.    Patient presents today for follow up for atrial fibrillation. Patient reports that recently she has had more frequent tachypalpitations. They can occur 3-4 times per daily and last for several minutes. There does not appears to be any specific triggers. She denies significant snoring or alcohol use. No bleeding issues on anticoagulation.   Today, she denies symptoms of chest pain, shortness of breath, orthopnea, PND, lower extremity edema, dizziness, presyncope, syncope, snoring, daytime somnolence, bleeding, or neurologic sequela. The patient is tolerating medications without difficulties and is otherwise without complaint today.    Atrial Fibrillation Risk Factors:  she does not have symptoms or diagnosis of sleep apnea. she does not have a history of rheumatic fever. she does not have a history of alcohol use. The patient does not have a history of early familial atrial fibrillation or other arrhythmias.  Atrial Fibrillation Management history:  Previous antiarrhythmic drugs: none Previous cardioversions: none Previous ablations: none Anticoagulation history: Eliquis , Pradaxa   ROS- All systems are reviewed and negative except as per the HPI above.  Past Medical History:  Diagnosis Date   Accelerating angina (HCC) 11/04/2017    Allergy     Asthma    Atrial fibrillation (HCC) 08/27/2020   Bone spur 10/11/2021   Cataract    Chronic kidney disease    Chronic pain disorder    neck and shoulder   Chronic pain of right upper extremity 11/10/2017   Coronary artery disease 2019   Stent x2   CTS (carpal tunnel syndrome) 01/27/2013   right   Diabetes mellitus type II    Diabetic gastroparesis (HCC)    Dysrhythmia    A. Fib   Esophageal reflux 08/25/2013   [place], Dr Camilla Cedar   FATTY LIVER DISEASE 04/27/2007   Qualifier: Diagnosis of  By: Sanford Crumble CMA, Darlene     Focal muscle atrophy 09/26/2015   Fundic gland polyps of stomach, benign    Globus sensation 04/13/2018   Hordeolum externum of right upper eyelid 12/24/2021   HTN (hypertension)    Hx of adenomatous colonic polyps 12/10/2021   Hyperlipidemia, mixed 05/04/2007   Qualifier: Diagnosis of  By: Marthe Slain crestor  caused myalgias even at low dose Livalo  caused myalgias Lipitor  Mother with severe reaction myalgias Simvastatin, Welchol     Hypokalemia 05/25/2013   Improved stopping HCTZ. Was noted to have an elevated glucose when K was low. Recheck renal next week after starting Maxzide   Hypomagnesemia 08/10/2014   Hypothyroidism    Iron  deficiency anemia 08/07/2018   Iron  malabsorption 08/07/2018   Laryngitis 08/25/2017   Leg cramps 04/15/2016   Nausea without vomiting 08/08/2014   NONSPEC ELEVATION OF LEVELS OF TRANSAMINASE/LDH 05/01/2007   Qualifier: Diagnosis of  By: Robyn Christ     Osteoarthritis    chronic, right knee (11/04/2017)   Plantar fasciitis    Pneumonia  Rosacea 10/22/2016   Urine incontinence     Current Outpatient Medications  Medication Sig Dispense Refill   Calcium  Carbonate (CALTRATE 600 PO) Take 650 mg by mouth daily.     Carboxymeth-Glyc-Polysorb PF (REFRESH OPTIVE MEGA-3) 0.5-1-0.5 % SOLN Place 1 drop into both eyes 3 (three) times daily.     dabigatran  (PRADAXA ) 150 MG CAPS capsule Take 1 capsule (150  mg total) by mouth 2 (two) times daily. 180 capsule 3   ezetimibe  (ZETIA ) 10 MG tablet Take 10 mg by mouth daily.     fenofibrate  160 MG tablet TAKE 1 TABLET DAILY 90 tablet 3   fexofenadine (ALLEGRA) 180 MG tablet Take 180 mg by mouth daily.     isosorbide  mononitrate (IMDUR ) 30 MG 24 hr tablet Take 1 tablet (30 mg total) by mouth daily. 90 tablet 3   ketoconazole (NIZORAL) 2 % shampoo Apply 1 Application topically 2 (two) times a week.     levothyroxine  (SYNTHROID ) 100 MCG tablet Take 100 mcg by mouth daily before breakfast.     Magnesium  250 MG TABS Take 250 mg by mouth daily.     metFORMIN  (GLUCOPHAGE -XR) 500 MG 24 hr tablet Take 500 mg by mouth 2 (two) times daily.     nitroGLYCERIN  (NITROSTAT ) 0.4 MG SL tablet Place 1 tablet (0.4 mg total) under the tongue every 5 (five) minutes as needed for chest pain. 25 tablet PRN   NOVOLIN 70/30 KWIKPEN (70-30) 100 UNIT/ML KwikPen Inject 14-25 Units into the skin See admin instructions. Take 24 mg in the morning 14 mg at lunch and 25 mg a bedtime     pantoprazole  (PROTONIX ) 40 MG tablet Take 1 tablet (40 mg total) by mouth 2 (two) times daily before a meal. 180 tablet 0   pregabalin  (LYRICA ) 50 MG capsule Take 1 capsule (50 mg total) by mouth 2 (two) times daily. 180 capsule 1   Probiotic Product (PROBIOTIC DAILY PO) Take 1 tablet by mouth daily.     Study - VESALIUS - evolocumab  (AMG 145) 140 mg/mL or placebo SQ injection (PI-Hilty) Inject 1 mL (140 mg total) into the skin every 14 (fourteen) days. Return kit at next visit.If any questions or concerns occur regarding this medication, please contact [facility]. **For Investigational Drug Use Only** 8 mL 0   metoprolol  tartrate (LOPRESSOR ) 50 MG tablet Take 1 tablet (50 mg total) by mouth 2 (two) times daily. 60 tablet 2   No current facility-administered medications for this encounter.    Physical Exam: BP (!) 148/74   Pulse 75   Ht 5' 5.5 (1.664 m)   Wt 82.6 kg   BMI 29.83 kg/m   GEN:  Well nourished, well developed in no acute distress CARDIAC: Regular rate and rhythm, no murmurs, rubs, gallops RESPIRATORY:  Clear to auscultation without rales, wheezing or rhonchi  ABDOMEN: Soft, non-tender, non-distended EXTREMITIES:  No edema; No deformity   Wt Readings from Last 3 Encounters:  11/12/23 82.6 kg  10/21/23 83 kg  09/29/23 83.5 kg     EKG today demonstrates  SR Vent. rate 75 BPM PR interval 168 ms QRS duration 76 ms QT/QTcB 400/446 ms   Echo 08/27/20 demonstrated   1. Left ventricular ejection fraction, by estimation, is 65 to 70%. The  left ventricle has normal function. The left ventricle has no regional  wall motion abnormalities. There is mild left ventricular hypertrophy.  Left ventricular diastolic parameters are consistent with Grade I diastolic dysfunction (impaired relaxation).   2. Right ventricular  systolic function is normal. The right ventricular  size is normal.   3. The mitral valve is normal in structure. Trivial mitral valve  regurgitation. No evidence of mitral stenosis.   4. The aortic valve is normal in structure. Aortic valve regurgitation is  not visualized. No aortic stenosis is present.   Comparison(s): A prior study was performed on 10/07/2017. No significant  change from prior study. Prior images reviewed side by side. Trivial  pericaridal effusion with prominent epicardial adipose layer is unchanged.    CHA2DS2-VASc Score = 5  The patient's score is based upon: CHF History: 0 HTN History: 1 Diabetes History: 1 Stroke History: 0 Vascular Disease History: 1 Age Score: 1 Gender Score: 1       ASSESSMENT AND PLAN: Paroxysmal Atrial Fibrillation (ICD10:  I48.0) The patient's CHA2DS2-VASc score is 5, indicating a 7.2% annual risk of stroke.   Patient having more frequent symptoms of tachypalpitations. In SR today. We discussed rhythm control options including AAD and ablation. Patient would like to see how she feels on higher  dose of BB before making any other changes.  Increase Lopressor  to 50 mg BID Continue Pradaxa  150 mg BID  Secondary Hypercoagulable State (ICD10:  D68.69) The patient is at significant risk for stroke/thromboembolism based upon her CHA2DS2-VASc Score of 5.  Continue Dabigatran  (Pradaxa ). No bleeding issues.   HTN Stable on current regimen  CAD No anginal symptoms Followed by Dr Lavonne Prairie    Follow up in the AF clinic in one month.        [facility] [address] [place], [place] [address] [phone]

## 2023-11-27 ENCOUNTER — Encounter: Admitting: *Deleted

## 2023-11-27 VITALS — BP 154/51 | HR 66 | Temp 97.7°F | Resp 16 | Wt 180.4 lb

## 2023-11-27 DIAGNOSIS — Z006 Encounter for examination for normal comparison and control in clinical research program: Secondary | ICD-10-CM

## 2023-11-27 NOTE — Research (Addendum)
 Vitals: [x]  Experience any AE/SAE/Hospitalizations [x]   If yes please explain: food poison - May 2025 will update EDC with AE   Non-Fatal Potential Endpoint Assessment Yes  No   Has the subject experienced/undergone any of the following since the last visit/contact?   []   [x]    Any Coronary Artery Revascularization/Cerebrovascular Revascularization/ Peripheral Artery Revascularization/Amputation Procedure   []   [x]    Myocardial Infarction []   [x]    Stroke   []   [x]    Provide the date for the non-fatal Potential Endpoints status:   11/27/2023   Thanked the patient for participating in the study.  Cardiologist has been contacted as what next steps are for this patient.  Date of last IP admin: 11/25/2023 Subject completed  Not used IP brought back: 2 = [id number] took IP to pharmacy  Used IP brought back: [id number]    [id number]

## 2023-12-02 ENCOUNTER — Telehealth: Payer: Self-pay

## 2023-12-02 ENCOUNTER — Ambulatory Visit: Attending: Cardiology | Admitting: Pharmacist

## 2023-12-02 ENCOUNTER — Other Ambulatory Visit: Payer: Self-pay | Admitting: Pharmacist

## 2023-12-02 DIAGNOSIS — Z9861 Coronary angioplasty status: Secondary | ICD-10-CM | POA: Diagnosis present

## 2023-12-02 DIAGNOSIS — E785 Hyperlipidemia, unspecified: Secondary | ICD-10-CM | POA: Insufficient documentation

## 2023-12-02 DIAGNOSIS — E1169 Type 2 diabetes mellitus with other specified complication: Secondary | ICD-10-CM | POA: Diagnosis present

## 2023-12-02 DIAGNOSIS — I2510 Atherosclerotic heart disease of native coronary artery without angina pectoris: Secondary | ICD-10-CM | POA: Diagnosis present

## 2023-12-02 NOTE — Assessment & Plan Note (Addendum)
 Assessment: Patient just finished VESALIUS CV trial and now needs to be placed on additional lipid lowering therapy Looks to me like she was most likely on placebo We discussed PCSK9 versus Leqvio.  We discussed cost, side effects, cardiovascular risks reduction data available.  Patient likes the fact that Leqvio is only every 6 months.  She is aware that there is no cardiovascular risk reduction data yet.  Her supplement will pick up 100% of the cost of Leqvio.  She would qualify for grant for Repatha . She eats out frequently.  Diet high in bread and refined carbs.  Has no plans to change her diet Very little exercise  Plan: Patient would like to proceed with Leqvio (her husband is also on this) Prefers the [facility] infusion center as it is closer to her house Referral placed-they should call her soon to schedule If patient would like updated labs she may get them prior to starting Leqvio

## 2023-12-02 NOTE — Telephone Encounter (Signed)
 Dr. Hochrein and Melissa, patient will be scheduled as soon as possible.  Auth Submission: NO AUTH NEEDED Site of care: Site of care: [facility] Payer: Medicare A/B with [employer] supplement Medication & CPT/J Code(s) submitted: Leqvio (Inclisiran) J1306 Diagnosis Code:  Route of submission (phone, fax, portal):  Phone # Fax # Auth type: Buy/Bill PB Units/visits requested: 284mg  x 2 doses Reference number:  Approval from: 12/02/23 to 06/26/24

## 2023-12-02 NOTE — Patient Instructions (Addendum)
 I will put in a referral for Leqvio. Someone should reach out to you for scheduling.  Hyperlipidemia Foods high in saturated fat tend to increase LDL (bad) cholesterol the most.  Not all fat is bad fat! Foods higher in unsaturated fat are healthy, like fish, nuts, and avocadoes. Overall, following a diet like the Mediterranean diet can help to improve your cholesterol. Hypertriglyceridemia Foods high in carbohydrates and sugar, as well as alcohol, can increase your triglycerides. If you are diabetic, poorly controlled blood sugar can also increase your triglycerides. A non-fasting state can affect the triglyceride level in your lab work. Please make sure you are fasting to improve accuracy of this lab test.  Eat more of these Eat less of these  Carbohydrates Fiber-rich whole grains: oats, whole wheat pasta or bread, quinoa, barley, oats and brown rice Aim for  of your plate to be whole grains Men: aim for > 38 grams of fiber per day Women: aim for > 25 grams of fiber per day Refined grains: white bread, rice, or pasta, macaroni and cheese Foods with added sugar Processed foods: desserts like cake, cookies, donuts, muffins, and pastries; microwave meals, chips, [place] fries  Fruits and vegetables A variety of bright colored fruits and vegetables: spinach, broccoli, tomatoes, carrots, berries,  oranges, apples, bananas, berries, and melon Aim for  of your plate to be fruits/vegetables Canned vegetables Starchy vegetables like potatoes Canned fruit in heavy syrup  Protein Lean meat: skinless chicken or [place] Fish: salmon, trout, tuna, cod, tilapia, flounder, etc Legumes: beans, lentils, chickpeas, tofu, nuts Aim for  of your plate to be protein Red, fatty, or fried meat Processed foods: deli meat, hot dogs, burgers, pizza, fast food   Dairy, fats and oils Unsaturated fats: fish, nuts, and avocadoes  Low fat or fat free milk or yogurt Olive and canola oil Saturated fats: butter, lard,  cream, coconut oil Whole milk and other full fat dairy products like cheese Sugar-sweetened dairy products (many yogurts have added sugar)  Drinks Water: plain or sparkling Sugar free or diet drinks Unsweet tea or coffee Keep added sugar intake to  < 6 teaspoons (24 grams) Regular soda Fruit juice Alcohol  Other ways to adopt a healthy lifestyle:  Exercise:  Exercise: Aim for 150 min of moderate intensity exercise weekly for heart health, and weights twice weekly for bone health. Stay active - any steps are better than no steps!  Sleep: Aim for 7-9 hours of sleep nightly.  Weight: Know what a healthy weight is for you (roughly BMI <25) and aim to maintain this. Unfortunately, this is not the most accurate measure of healthy weight, but it is the simplest measurement to use. A more accurate measurement involves body scanning which measures lean muscle, fat tissue and bony density. We do not have this equipment at [facility].

## 2023-12-02 NOTE — Research (Addendum)
 Are there any labs that are clinically significant?  Yes []  OR No[x]   Vinie KYM Maxcy, MD, [facility], FNLA, FACP  [place]  [facility]  Medical Director of the Advanced Lipid Disorders &  Cardiovascular Risk Reduction Clinic Diplomate of the American Board of Clinical Lipidology Attending Cardiologist  Direct Dial: [phone]  Fax: [phone]  Website:  [email or web address]

## 2023-12-02 NOTE — Progress Notes (Signed)
 Patient ID: CYARA DEVOTO                 DOB: [birth year]                    MRN: [id number]      HPI: [name] [name] is a 68 y.o. female patient referred to lipid clinic by Dr. Lavona. PMH is significant for coronary artery disease status post DES to ostial/proximal LAD in 6/19 with occlusion of D1 branch, afib, HTN, DM.  Patient just finished the VESALIUS CV trial (Repatha  vs Placebo), was enrolled in 2021.  She presents today to lipid clinic to start new lipid therapy.  It appears to me based on labs that were drawn while she was in the trial that patient was on placebo.  Her baseline LDL-C appears to be around 140-150.  This was when she was only on fenofibrate .  During the trial she was started on ezetimibe .  Her LDL-C in September 2024 was 105.  I would have expected if patient was on actual drug and her LDL-C would be much lower.  This appears to be about 27% reduction most likely from ezetimibe .  We discussed PCSK9 versus Leqvio.  We discussed cost, side effects, cardiovascular risks reduction data available.  Patient likes the fact that Leqvio is only every 6 months.  She is aware that there is no cardiovascular risk reduction data yet.  Her supplement will pick up 100% of the cost of Leqvio.  She would qualify for grant for Repatha .  Patient knows that she does not eat the way she should, but she does not change how she eats.  Her exercise is limited due to knee and hip pain.  She is aware she could try some chair exercises but has not done this.  She does not have a membership to a pool.  Last trial injection was July 1.  Current Medications: ezetimibe  10mg  daily, fenofibrate  160mg  daily Intolerances: simvastatin, welchol  (muscle pain), rosuvastatin  10mg , 2.5mg  twice a week, pitavastatin  1, 2mg  (muscle pain) Risk Factors: CAD, DM, HTN LDL-C goal: <70 ApoB goal: <80  Diet:  Breakfast: biscuits sandwich, pancakes, waffles, eggs Lunch: hamburgers, chicken sandwich, eft overs,  cheese sandwich Dinner: meat and vegetables Drink: water Snack: celery and PB, peanuts, carrots  Exercise: walks about 3,000 steps per day- any more knee or hip hurts  Family History:  Family History  Problem Relation Age of Onset   Alzheimer's disease Mother    Diabetes type II Mother    Hiatal hernia Mother    Diabetes Mother    Emphysema Father    COPD Father    Depression Sister        suicide   Diabetes Sister    Diabetes Daughter    Colon cancer Neg Hx    Stomach cancer Neg Hx    Esophageal cancer Neg Hx    Rectal cancer Neg Hx     Social History: no tobacco, no ETOH  Labs: Lipid Panel  LP(a) 64    Component Value Date/Time   CHOL 176 01/29/2023 0000   CHOL 228 (H) 03/31/2020 1300   TRIG 120 01/29/2023 0000   HDL 50 01/29/2023 0000   HDL 47 03/31/2020 1300   CHOLHDL 5.5 08/27/2020 0119   VLDL 51 (H) 08/27/2020 0119   LDLCALC 105 01/29/2023 0000   LDLCALC 119 (H) 03/31/2020 1300   LDLDIRECT 151.0 06/01/2015 0812   LABVLDL 62 (H) 03/31/2020 1300    Past  Medical History:  Diagnosis Date   Accelerating angina (HCC) 11/04/2017   Allergy     Asthma    Atrial fibrillation (HCC) 08/27/2020   Bone spur 10/11/2021   Cataract    Chronic kidney disease    Chronic pain disorder    neck and shoulder   Chronic pain of right upper extremity 11/10/2017   Coronary artery disease 2019   Stent x2   CTS (carpal tunnel syndrome) 01/27/2013   right   Diabetes mellitus type II    Diabetic gastroparesis (HCC)    Dysrhythmia    A. Fib   Esophageal reflux 08/25/2013   [place], Dr Norvell   FATTY LIVER DISEASE 04/27/2007   Qualifier: Diagnosis of  By: Anice CMA, Darlene     Focal muscle atrophy 09/26/2015   Fundic gland polyps of stomach, benign    Globus sensation 04/13/2018   Hordeolum externum of right upper eyelid 12/24/2021   HTN (hypertension)    Hx of adenomatous colonic polyps 12/10/2021   Hyperlipidemia, mixed 05/04/2007   Qualifier:  Diagnosis of  By: Georgian ROSALEA CHARM Lamar crestor  caused myalgias even at low dose Livalo  caused myalgias Lipitor  Mother with severe reaction myalgias Simvastatin, Welchol     Hypokalemia 05/25/2013   Improved stopping HCTZ. Was noted to have an elevated glucose when K was low. Recheck renal next week after starting Maxzide   Hypomagnesemia 08/10/2014   Hypothyroidism    Iron  deficiency anemia 08/07/2018   Iron  malabsorption 08/07/2018   Laryngitis 08/25/2017   Leg cramps 04/15/2016   Nausea without vomiting 08/08/2014   NONSPEC ELEVATION OF LEVELS OF TRANSAMINASE/LDH 05/01/2007   Qualifier: Diagnosis of  By: Eyvonne Jacob     Osteoarthritis    chronic, right knee (11/04/2017)   Plantar fasciitis    Pneumonia    Rosacea 10/22/2016   Urine incontinence     Current Outpatient Medications on File Prior to Visit  Medication Sig Dispense Refill   Calcium  Carbonate (CALTRATE 600 PO) Take 650 mg by mouth daily.     Carboxymeth-Glyc-Polysorb PF (REFRESH OPTIVE MEGA-3) 0.5-1-0.5 % SOLN Place 1 drop into both eyes 3 (three) times daily.     Cyanocobalamin  (VITAMIN B12) 1000 MCG TBCR 1 tablet Orally Once a day; Duration: 30 day(s)     dabigatran  (PRADAXA ) 150 MG CAPS capsule Take 1 capsule (150 mg total) by mouth 2 (two) times daily. 180 capsule 3   ezetimibe  (ZETIA ) 10 MG tablet Take 10 mg by mouth daily.     fenofibrate  160 MG tablet TAKE 1 TABLET DAILY 90 tablet 3   fexofenadine (ALLEGRA) 180 MG tablet Take 180 mg by mouth daily.     isosorbide  mononitrate (IMDUR ) 30 MG 24 hr tablet Take 1 tablet (30 mg total) by mouth daily. 90 tablet 3   ketoconazole (NIZORAL) 2 % shampoo Apply 1 Application topically 2 (two) times a week.     levothyroxine  (SYNTHROID ) 100 MCG tablet Take 100 mcg by mouth daily before breakfast.     Magnesium  250 MG TABS Take 250 mg by mouth daily.     metFORMIN  (GLUCOPHAGE -XR) 500 MG 24 hr tablet Take 500 mg by mouth 2 (two) times daily.     metoprolol  tartrate (LOPRESSOR ) 50 MG  tablet Take 1 tablet (50 mg total) by mouth 2 (two) times daily. 60 tablet 2   nitroGLYCERIN  (NITROSTAT ) 0.4 MG SL tablet Place 1 tablet (0.4 mg total) under the tongue every 5 (five) minutes as needed for chest pain. 25 tablet  PRN   NOVOLIN 70/30 KWIKPEN (70-30) 100 UNIT/ML KwikPen Inject 14-25 Units into the skin See admin instructions. Take 24 mg in the morning 14 mg at lunch and 25 mg a bedtime     pantoprazole  (PROTONIX ) 40 MG tablet Take 1 tablet (40 mg total) by mouth 2 (two) times daily before a meal. 180 tablet 0   pregabalin  (LYRICA ) 50 MG capsule Take 1 capsule (50 mg total) by mouth 2 (two) times daily. 180 capsule 1   Probiotic Product (PROBIOTIC DAILY PO) Take 1 tablet by mouth daily.     No current facility-administered medications on file prior to visit.    Allergies  Allergen Reactions   Epinephrine  Other (See Comments)    Pass out    Other     Other reaction(s): Myalgia   Statins Other (See Comments)    Myalgias: Simvastatin also caused back ache   Welchol  [Colesevelam  Hcl]     myalgia   Amoxicillin Itching and Rash    Has patient had a PCN reaction causing immediate rash, facial/tongue/throat swelling, SOB or lightheadedness with hypotension: No Has patient had a PCN reaction causing severe rash involving mucus membranes or skin necrosis: No Has patient had a PCN reaction that required hospitalization: No Has patient had a PCN reaction occurring within the last 10 years: Yes If all of the above answers are NO, then may proceed with Cephalosporin use.     Assessment/Plan:  1. Hyperlipidemia -  Hyperlipidemia associated with type 2 diabetes mellitus (HCC) Assessment: Patient just finished VESALIUS CV trial and now needs to be placed on additional lipid lowering therapy Looks to me like she was most likely on placebo We discussed PCSK9 versus Leqvio.  We discussed cost, side effects, cardiovascular risks reduction data available.  Patient likes the fact that  Leqvio is only every 6 months.  She is aware that there is no cardiovascular risk reduction data yet.  Her supplement will pick up 100% of the cost of Leqvio.  She would qualify for grant for Repatha . She eats out frequently.  Diet high in bread and refined carbs.  Has no plans to change her diet Very little exercise  Plan: Patient would like to proceed with Leqvio (her husband is also on this) Prefers the [facility] infusion center as it is closer to her house Referral placed-they should call her soon to schedule If patient would like updated labs she may get them prior to starting Leqvio    Thank you,  Trenyce Loera D Yamili Lichtenwalner, Pharm.JONETTA SARAN, CPP [facility] [address] [place], [place] [address]  Phone: [phone]; Fax: [phone]

## 2023-12-06 DIAGNOSIS — S73199A Other sprain of unspecified hip, initial encounter: Secondary | ICD-10-CM | POA: Insufficient documentation

## 2023-12-10 ENCOUNTER — Ambulatory Visit

## 2023-12-10 VITALS — BP 153/78 | HR 67 | Temp 97.7°F | Resp 16 | Ht 65.0 in | Wt 181.8 lb

## 2023-12-10 DIAGNOSIS — E785 Hyperlipidemia, unspecified: Secondary | ICD-10-CM

## 2023-12-10 DIAGNOSIS — Z9861 Coronary angioplasty status: Secondary | ICD-10-CM | POA: Diagnosis not present

## 2023-12-10 DIAGNOSIS — I2510 Atherosclerotic heart disease of native coronary artery without angina pectoris: Secondary | ICD-10-CM

## 2023-12-10 MED ORDER — INCLISIRAN SODIUM 284 MG/1.5ML ~~LOC~~ SOSY
284.0000 mg | PREFILLED_SYRINGE | Freq: Once | SUBCUTANEOUS | Status: AC
  Administered 2023-12-10: 284 mg via SUBCUTANEOUS
  Filled 2023-12-10: qty 1.5

## 2023-12-10 NOTE — Progress Notes (Signed)
 Diagnosis: Hyperlipidemia  Provider:  Mannam, Praveen MD  Procedure: Injection  Leqvio  (inclisiran), Dose: 284 mg, Site: subcutaneous, Number of injections: 1  Injection Site(s): Left arm  Post Care: Observation period completed  Discharge: Condition: Good, Destination: Home . AVS Declined  Performed by:  Lauran Pollard, LPN

## 2023-12-11 ENCOUNTER — Ambulatory Visit: Payer: Self-pay | Admitting: Pharmacist

## 2023-12-11 LAB — LIPID PANEL
Chol/HDL Ratio: 3.7 ratio (ref 0.0–4.4)
Cholesterol, Total: 153 mg/dL (ref 100–199)
HDL: 41 mg/dL (ref >39)
LDL Chol Calc (NIH): 84 mg/dL (ref 0–99)
Triglycerides: 162 mg/dL — ABNORMAL HIGH (ref 0–149)
VLDL Cholesterol Cal: 28 mg/dL (ref 5–40)

## 2023-12-11 LAB — APOLIPOPROTEIN B: Apolipoprotein B: 88 mg/dL (ref <90)

## 2023-12-11 NOTE — Addendum Note (Signed)
 Addended by: Markise Haymer D on: 12/11/2023 01:06 PM   Modules accepted: Orders

## 2023-12-12 ENCOUNTER — Ambulatory Visit (HOSPITAL_COMMUNITY)
Admission: RE | Admit: 2023-12-12 | Discharge: 2023-12-12 | Disposition: A | Discharge status: Home / Self Care | Source: Ambulatory Visit | Attending: Physician Assistant | Admitting: Physician Assistant

## 2023-12-12 ENCOUNTER — Encounter (HOSPITAL_COMMUNITY): Payer: Self-pay | Admitting: Physician Assistant

## 2023-12-12 VITALS — BP 118/70 | HR 68 | Ht 65.0 in | Wt 181.8 lb

## 2023-12-12 DIAGNOSIS — D6869 Other thrombophilia: Secondary | ICD-10-CM | POA: Diagnosis present

## 2023-12-12 DIAGNOSIS — I480 Paroxysmal atrial fibrillation: Secondary | ICD-10-CM | POA: Diagnosis present

## 2023-12-12 NOTE — Progress Notes (Signed)
 Primary Care Physician: Catherine Charlies LABOR, DO Primary Cardiologist: Lynwood Schilling, MD Electrophysiologist: None  Referring Physician: Dr Schilling Arland [name] [name] is a 68 y.o. female with a history of CAD, HTN, DM, HLD, atrial fibrillation who presents for follow up in the [facility].  The patient was initially diagnosed with atrial fibrillation 08/2020 after presenting to the ED with symptoms of chest and jaw pain. Durin that admission she went into rapid afib with symptoms of palpitations, fatigue, and chest pain. Zio patch after discharge did not show any recurrent afib. Patient is on Pradaxa  for stroke prevention. She was seen 11/12/23 with increased palpitations, BB increased.    Patient returns for follow up for atrial fibrillation. She reports since increasing metoprolol  her afib episodes have greatly reduced. She will have brief episodes periodically but they last only seconds. No bleeding issues on anticoagulation.   Today, she  denies symptoms of chest pain, shortness of breath, orthopnea, PND, lower extremity edema, dizziness, presyncope, syncope, snoring, daytime somnolence, bleeding, or neurologic sequela. The patient is tolerating medications without difficulties and is otherwise without complaint today.    Atrial Fibrillation Risk Factors:  she does not have symptoms or diagnosis of sleep apnea. she does not have a history of rheumatic fever. she does not have a history of alcohol use. The patient does not have a history of early familial atrial fibrillation or other arrhythmias.   Atrial Fibrillation Management history:  Previous antiarrhythmic drugs: none Previous cardioversions: none Previous ablations: none Anticoagulation history: Eliquis , Pradaxa   ROS- All systems are reviewed and negative except as per the HPI above.  Past Medical History:  Diagnosis Date   Accelerating angina (HCC) 11/04/2017   Allergy     Asthma    Atrial  fibrillation (HCC) 08/27/2020   Bone spur 10/11/2021   Cataract    Chronic kidney disease    Chronic pain disorder    neck and shoulder   Chronic pain of right upper extremity 11/10/2017   Coronary artery disease 2019   Stent x2   CTS (carpal tunnel syndrome) 01/27/2013   right   Diabetes mellitus type II    Diabetic gastroparesis (HCC)    Dysrhythmia    A. Fib   Esophageal reflux 08/25/2013   [place], Dr Norvell   FATTY LIVER DISEASE 04/27/2007   Qualifier: Diagnosis of  By: Anice CMA, Darlene     Focal muscle atrophy 09/26/2015   Fundic gland polyps of stomach, benign    Globus sensation 04/13/2018   Hordeolum externum of right upper eyelid 12/24/2021   HTN (hypertension)    Hx of adenomatous colonic polyps 12/10/2021   Hyperlipidemia, mixed 05/04/2007   Qualifier: Diagnosis of  By: Georgian ROSALEA CHARM Lamar crestor  caused myalgias even at low dose Livalo  caused myalgias Lipitor  Mother with severe reaction myalgias Simvastatin, Welchol     Hypokalemia 05/25/2013   Improved stopping HCTZ. Was noted to have an elevated glucose when K was low. Recheck renal next week after starting Maxzide   Hypomagnesemia 08/10/2014   Hypothyroidism    Iron  deficiency anemia 08/07/2018   Iron  malabsorption 08/07/2018   Laryngitis 08/25/2017   Leg cramps 04/15/2016   Nausea without vomiting 08/08/2014   NONSPEC ELEVATION OF LEVELS OF TRANSAMINASE/LDH 05/01/2007   Qualifier: Diagnosis of  By: Eyvonne Jacob     Osteoarthritis    chronic, right knee (11/04/2017)   Plantar fasciitis    Pneumonia    Rosacea 10/22/2016  Urine incontinence     Current Outpatient Medications  Medication Sig Dispense Refill   Calcium  Carbonate (CALTRATE 600 PO) Take 650 mg by mouth daily.     Carboxymeth-Glyc-Polysorb PF (REFRESH OPTIVE MEGA-3) 0.5-1-0.5 % SOLN Place 1 drop into both eyes 3 (three) times daily.     Cyanocobalamin  (VITAMIN B12) 1000 MCG TBCR 1 tablet Orally Once a day; Duration: 30 day(s)      dabigatran  (PRADAXA ) 150 MG CAPS capsule Take 1 capsule (150 mg total) by mouth 2 (two) times daily. 180 capsule 3   ezetimibe  (ZETIA ) 10 MG tablet Take 10 mg by mouth daily.     fenofibrate  160 MG tablet TAKE 1 TABLET DAILY 90 tablet 3   fexofenadine (ALLEGRA) 180 MG tablet Take 180 mg by mouth daily.     Inclisiran Sodium  (LEQVIO  [place]) Inject into the skin.     isosorbide  mononitrate (IMDUR ) 30 MG 24 hr tablet Take 1 tablet (30 mg total) by mouth daily. 90 tablet 3   ketoconazole (NIZORAL) 2 % shampoo Apply 1 Application topically 2 (two) times a week.     levothyroxine  (SYNTHROID ) 100 MCG tablet Take 100 mcg by mouth daily before breakfast.     Magnesium  250 MG TABS Take 250 mg by mouth daily.     metFORMIN  (GLUCOPHAGE -XR) 500 MG 24 hr tablet Take 500 mg by mouth 2 (two) times daily.     metoprolol  tartrate (LOPRESSOR ) 50 MG tablet Take 1 tablet (50 mg total) by mouth 2 (two) times daily. 60 tablet 2   nitroGLYCERIN  (NITROSTAT ) 0.4 MG SL tablet Place 1 tablet (0.4 mg total) under the tongue every 5 (five) minutes as needed for chest pain. 25 tablet PRN   NOVOLIN 70/30 KWIKPEN (70-30) 100 UNIT/ML KwikPen Inject 14-25 Units into the skin See admin instructions. Take 24 mg in the morning 14 mg at lunch and 25 mg a bedtime     pantoprazole  (PROTONIX ) 40 MG tablet Take 1 tablet (40 mg total) by mouth 2 (two) times daily before a meal. 180 tablet 0   pregabalin  (LYRICA ) 50 MG capsule Take 1 capsule (50 mg total) by mouth 2 (two) times daily. 180 capsule 1   Probiotic Product (PROBIOTIC DAILY PO) Take 1 tablet by mouth daily.     No current facility-administered medications for this encounter.    Physical Exam: BP 118/70   Pulse 68   Ht 5' 5 (1.651 m)   Wt 82.5 kg   BMI 30.25 kg/m   GEN: Well nourished, well developed in no acute distress CARDIAC: Regular rate and rhythm, no murmurs, rubs, gallops RESPIRATORY:  Clear to auscultation without rales, wheezing or rhonchi  ABDOMEN: Soft,  non-tender, non-distended EXTREMITIES:  No edema; No deformity    Wt Readings from Last 3 Encounters:  12/12/23 82.5 kg  12/10/23 82.5 kg  11/27/23 81.8 kg     EKG today demonstrates  SR Vent. rate 68 BPM PR interval 186 ms QRS duration 72 ms QT/QTcB 416/442 ms   Echo 08/27/20 demonstrated   1. Left ventricular ejection fraction, by estimation, is 65 to 70%. The  left ventricle has normal function. The left ventricle has no regional  wall motion abnormalities. There is mild left ventricular hypertrophy.  Left ventricular diastolic parameters are consistent with Grade I diastolic dysfunction (impaired relaxation).   2. Right ventricular systolic function is normal. The right ventricular  size is normal.   3. The mitral valve is normal in structure. Trivial mitral valve  regurgitation. No  evidence of mitral stenosis.   4. The aortic valve is normal in structure. Aortic valve regurgitation is  not visualized. No aortic stenosis is present.   Comparison(s): A prior study was performed on 10/07/2017. No significant  change from prior study. Prior images reviewed side by side. Trivial  pericaridal effusion with prominent epicardial adipose layer is unchanged.    CHA2DS2-VASc Score = 5  The patient's score is based upon: CHF History: 0 HTN History: 1 Diabetes History: 1 Stroke History: 0 Vascular Disease History: 1 Age Score: 1 Gender Score: 1       ASSESSMENT AND PLAN: Paroxysmal Atrial Fibrillation (ICD10:  I48.0) The patient's CHA2DS2-VASc score is 5, indicating a 7.2% annual risk of stroke.   Patient appears to be maintaining SR. Can consider AAD or ablation if her afib increases again.  Continue Lopressor  50 mg BID Continue Pradaxa  150 mg BID  Secondary Hypercoagulable State (ICD10:  D68.69) The patient is at significant risk for stroke/thromboembolism based upon her CHA2DS2-VASc Score of 5.  Continue Dabigatran  (Pradaxa ). No bleeding issues.   HTN Stable on  current regimen  CAD No anginal symptoms Followed by Dr Lavona    Follow up in the AF clinic in 6 months.       [facility] [address] [place], [place] [address] [phone]

## 2023-12-15 ENCOUNTER — Encounter: Payer: Self-pay | Admitting: Pharmacist

## 2023-12-16 ENCOUNTER — Other Ambulatory Visit: Payer: Self-pay | Admitting: Internal Medicine

## 2023-12-21 ENCOUNTER — Other Ambulatory Visit: Payer: Self-pay | Admitting: Internal Medicine

## 2023-12-23 ENCOUNTER — Encounter

## 2023-12-24 ENCOUNTER — Ambulatory Visit: Payer: Self-pay

## 2023-12-24 ENCOUNTER — Emergency Department (HOSPITAL_COMMUNITY)

## 2023-12-24 ENCOUNTER — Encounter (HOSPITAL_COMMUNITY): Payer: Self-pay | Admitting: Emergency Medicine

## 2023-12-24 ENCOUNTER — Emergency Department (HOSPITAL_COMMUNITY)
Admission: EM | Admit: 2023-12-24 | Discharge: 2023-12-24 | Disposition: A | Discharge status: Home / Self Care | Attending: Emergency Medicine | Admitting: Emergency Medicine

## 2023-12-24 ENCOUNTER — Other Ambulatory Visit: Payer: Self-pay

## 2023-12-24 DIAGNOSIS — Z79899 Other long term (current) drug therapy: Secondary | ICD-10-CM | POA: Diagnosis not present

## 2023-12-24 DIAGNOSIS — Z7984 Long term (current) use of oral hypoglycemic drugs: Secondary | ICD-10-CM | POA: Diagnosis not present

## 2023-12-24 DIAGNOSIS — E1122 Type 2 diabetes mellitus with diabetic chronic kidney disease: Secondary | ICD-10-CM | POA: Insufficient documentation

## 2023-12-24 DIAGNOSIS — N189 Chronic kidney disease, unspecified: Secondary | ICD-10-CM | POA: Diagnosis not present

## 2023-12-24 DIAGNOSIS — I129 Hypertensive chronic kidney disease with stage 1 through stage 4 chronic kidney disease, or unspecified chronic kidney disease: Secondary | ICD-10-CM | POA: Insufficient documentation

## 2023-12-24 DIAGNOSIS — R42 Dizziness and giddiness: Secondary | ICD-10-CM | POA: Insufficient documentation

## 2023-12-24 DIAGNOSIS — E039 Hypothyroidism, unspecified: Secondary | ICD-10-CM | POA: Insufficient documentation

## 2023-12-24 DIAGNOSIS — Z794 Long term (current) use of insulin: Secondary | ICD-10-CM | POA: Insufficient documentation

## 2023-12-24 LAB — URINALYSIS, ROUTINE W REFLEX MICROSCOPIC
Bilirubin Urine: NEGATIVE
Glucose, UA: 50 mg/dL — AB
Hgb urine dipstick: NEGATIVE
Ketones, ur: NEGATIVE mg/dL
Leukocytes,Ua: NEGATIVE
Nitrite: NEGATIVE
Protein, ur: NEGATIVE mg/dL
Specific Gravity, Urine: 1.01 (ref 1.005–1.030)
pH: 8 (ref 5.0–8.0)

## 2023-12-24 LAB — CBC
HCT: 35.3 % — ABNORMAL LOW (ref 36.0–46.0)
Hemoglobin: 11.6 g/dL — ABNORMAL LOW (ref 12.0–15.0)
MCH: 28 pg (ref 26.0–34.0)
MCHC: 32.9 g/dL (ref 30.0–36.0)
MCV: 85.1 fL (ref 80.0–100.0)
Platelets: 204 K/uL (ref 150–400)
RBC: 4.15 MIL/uL (ref 3.87–5.11)
RDW: 13.1 % (ref 11.5–15.5)
WBC: 5.6 K/uL (ref 4.0–10.5)
nRBC: 0 % (ref 0.0–0.2)

## 2023-12-24 LAB — COMPREHENSIVE METABOLIC PANEL WITH GFR
ALT: 27 U/L (ref 0–44)
AST: 34 U/L (ref 15–41)
Albumin: 3.4 g/dL — ABNORMAL LOW (ref 3.5–5.0)
Alkaline Phosphatase: 34 U/L — ABNORMAL LOW (ref 38–126)
Anion gap: 7 (ref 5–15)
BUN: 10 mg/dL (ref 8–23)
CO2: 26 mmol/L (ref 22–32)
Calcium: 8.8 mg/dL — ABNORMAL LOW (ref 8.9–10.3)
Chloride: 107 mmol/L (ref 98–111)
Creatinine, Ser: 0.86 mg/dL (ref 0.44–1.00)
GFR, Estimated: >60 mL/min (ref >60)
Glucose, Bld: 180 mg/dL — ABNORMAL HIGH (ref 70–99)
Potassium: 4 mmol/L (ref 3.5–5.1)
Sodium: 140 mmol/L (ref 135–145)
Total Bilirubin: 0.5 mg/dL (ref 0.0–1.2)
Total Protein: 6.1 g/dL — ABNORMAL LOW (ref 6.5–8.1)

## 2023-12-24 LAB — DIFFERENTIAL
Abs Immature Granulocytes: 0.02 K/uL (ref 0.00–0.07)
Basophils Absolute: 0 K/uL (ref 0.0–0.1)
Basophils Relative: 1 %
Eosinophils Absolute: 0.2 K/uL (ref 0.0–0.5)
Eosinophils Relative: 3 %
Immature Granulocytes: 0 %
Lymphocytes Relative: 20 %
Lymphs Abs: 1.1 K/uL (ref 0.7–4.0)
Monocytes Absolute: 0.4 K/uL (ref 0.1–1.0)
Monocytes Relative: 7 %
Neutro Abs: 3.9 K/uL (ref 1.7–7.7)
Neutrophils Relative %: 69 %

## 2023-12-24 LAB — MAGNESIUM: Magnesium: 1.3 mg/dL — ABNORMAL LOW (ref 1.7–2.4)

## 2023-12-24 LAB — CBG MONITORING, ED: Glucose-Capillary: 153 mg/dL — ABNORMAL HIGH (ref 70–99)

## 2023-12-24 LAB — APTT: aPTT: 46 s — ABNORMAL HIGH (ref 24–36)

## 2023-12-24 MED ORDER — MECLIZINE HCL 25 MG PO TABS: 25.0000 mg | ORAL_TABLET | Freq: Three times a day (TID) | ORAL | 0 refills | Status: DC | PRN

## 2023-12-24 MED ORDER — SODIUM CHLORIDE 0.9% FLUSH
3.0000 mL | Freq: Once | INTRAVENOUS | Status: AC
  Administered 2023-12-24: 10 mL via INTRAVENOUS

## 2023-12-24 MED ORDER — MECLIZINE HCL 25 MG PO TABS
25.0000 mg | ORAL_TABLET | Freq: Once | ORAL | Status: AC
  Administered 2023-12-24: 25 mg via ORAL
  Filled 2023-12-24: qty 1

## 2023-12-24 MED ORDER — METOCLOPRAMIDE HCL 5 MG/ML IJ SOLN
10.0000 mg | Freq: Once | INTRAMUSCULAR | Status: AC
  Administered 2023-12-24: 10 mg via INTRAVENOUS
  Filled 2023-12-24: qty 2

## 2023-12-24 MED ORDER — DIPHENHYDRAMINE HCL 50 MG/ML IJ SOLN
12.5000 mg | Freq: Once | INTRAMUSCULAR | Status: AC
  Administered 2023-12-24: 12.5 mg via INTRAVENOUS
  Filled 2023-12-24: qty 1

## 2023-12-24 MED ORDER — SODIUM CHLORIDE 0.9 % IV BOLUS
1000.0000 mL | Freq: Once | INTRAVENOUS | Status: AC
Start: 2023-12-24 — End: 2023-12-24
  Administered 2023-12-24: 1000 mL via INTRAVENOUS

## 2023-12-24 MED ORDER — LORAZEPAM 1 MG PO TABS
0.5000 mg | ORAL_TABLET | Freq: Once | ORAL | Status: AC
Start: 2023-12-24 — End: 2023-12-24
  Administered 2023-12-24: 0.5 mg via ORAL
  Filled 2023-12-24: qty 1

## 2023-12-24 MED ORDER — MAGNESIUM SULFATE 2 GM/50ML IV SOLN
2.0000 g | Freq: Once | INTRAVENOUS | Status: AC
Start: 2023-12-24 — End: 2023-12-24
  Administered 2023-12-24: 2 g via INTRAVENOUS
  Filled 2023-12-24: qty 50

## 2023-12-24 NOTE — ED Notes (Signed)
 CCMD is monitoring this patient.

## 2023-12-24 NOTE — ED Notes (Signed)
 Pt tolerated bedpan well. Tried to sit up in bed after but only last around 10s before having to lay back down

## 2023-12-24 NOTE — Discharge Instructions (Signed)
 Thank for letting us  evaluate you today.  Your CT and MRI scans were negative for stroke.  It appears that your dizziness is secondary to vertigo.  I have sent meclizine  to your pharmacy to use as needed for dizziness.  Please make sure to slowly get up from different positions to avoid dizziness and falling.  Please make sure to remain adequately hydrated.  Return to Emergency Department if you experience slurred speech, altered mentation, numbness or weakness in one-sided body, inability ambulate secondary to dizziness

## 2023-12-24 NOTE — ED Triage Notes (Signed)
 PT from home via [facility] with reports of dizziness and nausea that started last night. BP 156/86 with EMS.

## 2023-12-24 NOTE — ED Provider Notes (Signed)
 Received patient in signout from previous provider pending MRI, ambulation trial.  See her note.  In short, patient presents to Emergency Department for evaluation of vertigo that occurs at night.  Sounds positional in nature.  Resolves with laying down and not moving.  Sought ED evaluation today as she had more consistent and longer lasting vertigo last night.  No other symptoms of slurred speech, aphasia, altered mentation.  Neurologically intact per previous EDP assessment  ED workup notable for mild anemia of 11.6.  CBG 180.  Magnesium  1.3.  UA without infection or hemoglobin.  EKG shows NSR with no ST nor T wave abnormalities.  Provided IV magnesium  supplementation for hypomagnesia.  CT and MRI without acute abnormalities.   Following meclizine , Ativan , patient is unable to stand from hospital bed nor ambulate.  Will provide Reglan , Benadryl  to ensure no atypical migraine causing symptoms.    Reports significant improvement of dizziness and is able to ambulate without difficulty following migraine cocktail, IVF. Will DC with meclizine . Pt's husband will remain with pt overnight to keep an eye on her  Discussed ED workup, disposition, return to ED precautions with patient who expresses understanding agrees with plan.  All questions answered to their satisfaction.  They are agreeable to plan.  Discharge instructions provided on paperwork   Minnie Tinnie BRAVO, PA 12/24/23 2001    Albertina Dixon, MD 12/24/23 2308

## 2023-12-24 NOTE — Telephone Encounter (Signed)
 FYI Only or Action Required?: FYI only for provider.  Patient was last seen in primary care on 10/21/2023 by Catherine Fuller A, DO.  Called Nurse Triage reporting Hypertension.  Symptoms began today.  Interventions attempted: Nothing.  Symptoms are: gradually worsening.  Triage Disposition: Go to ED Now (Notify PCP)  Patient/caregiver understands and will follow disposition?: Yes  Copied from CRM [id number]. Topic: Clinical - Red Word Triage >> Dec 24, 2023  8:09 AM Deaijah H wrote: Red Word that prompted transfer to Nurse Triage: High BP 189/91 , dizzy Reason for Disposition  [1] Systolic BP >= 160 OR Diastolic >= 100 AND [2] cardiac (e.g., breathing difficulty, chest pain) or neurologic symptoms (e.g., new-onset blurred or double vision, unsteady gait)  Answer Assessment - Initial Assessment Questions 1. BLOOD PRESSURE: What is your blood pressure? Did you take at least two measurements 5 minutes apart?     189/91  2. ONSET: When did you take your blood pressure?     Just before calling in  3. HOW: How did you take your blood pressure? (e.g., automatic home BP monitor, visiting nurse)     Automatic BP Monitor  4. HISTORY: Do you have a history of high blood pressure?     Yes  5. MEDICINES: Are you taking any medicines for blood pressure? Have you missed any doses recently?     Yes  6. OTHER SYMPTOMS: Do you have any symptoms? (e.g., blurred vision, chest pain, difficulty breathing, headache, weakness)     Dizziness, Nausea  7. PREGNANCY: Is there any chance you are pregnant? When was your last menstrual period?     No and No  Protocols used: Blood Pressure - High-A-AH

## 2023-12-24 NOTE — ED Notes (Signed)
 Pt assisted onto bedside commode, tolerated well. Slight dizziness complaint per tech

## 2023-12-24 NOTE — Telephone Encounter (Signed)
 Called and spoke with pt. Pt stated she was currently too dizzy to walk to come in for an appt.  Was recommended by NT to go to ED; pt stated she was going to call EMS to take her. I told pt that if she needed anything to let us  know. Pt understood.

## 2023-12-24 NOTE — ED Provider Notes (Signed)
 [place] EMERGENCY DEPARTMENT AT [facility] Provider Note   CSN: [id number] Arrival date & time: 12/24/23  9058     Patient presents with: Dizziness   [name] [name] is a 68 y.o. female.  Patient with past medical history of diabetes, hypertension, hyperlipidemia, chronic kidney disease, hypothyroid, A-fib on Pradaxa  presents to emergency room with complaint of intermittent dizziness.  Patient reports that for the past few weeks she has had dizziness when laying down in bed at night.  She reports that it feels like the room is spinning sensation and it will resolve shortly after.  Patient reports similar sensation started last night but has continued into the day.  She does get relief from laying down flat.  When she moves her head or tries to stand up she gets very nauseous and feels like she is going to throw up.  She also notes elevated blood pressure readings at home.  Denies headache, chest pain shortness of breath or swelling in feet and ankles.  No acute vision changes, confusion, slurred speech or focal weakness.    Dizziness      Prior to Admission medications   Medication Sig Start Date End Date Taking? Authorizing Provider  Calcium  Carbonate (CALTRATE 600 PO) Take 650 mg by mouth daily.    [provider]  Carboxymeth-Glyc-Polysorb PF (REFRESH OPTIVE MEGA-3) 0.5-1-0.5 % SOLN Place 1 drop into both eyes 3 (three) times daily.    [provider]  Cyanocobalamin  (VITAMIN B12) 1000 MCG TBCR 1 tablet Orally Once a day; Duration: 30 day(s)    [provider]  dabigatran  (PRADAXA ) 150 MG CAPS capsule Take 1 capsule (150 mg total) by mouth 2 (two) times daily. 08/26/23   Lavona Agent, MD  ezetimibe  (ZETIA ) 10 MG tablet Take 10 mg by mouth daily.    [provider]  fenofibrate  160 MG tablet TAKE 1 TABLET DAILY 06/03/23   Lavona Agent, MD  fexofenadine (ALLEGRA) 180 MG tablet Take 180 mg by mouth daily.    [provider]   Inclisiran Sodium  (LEQVIO  [place]) Inject into the skin.    [provider]  isosorbide  mononitrate (IMDUR ) 30 MG 24 hr tablet Take 1 tablet (30 mg total) by mouth daily. 10/27/23   Lavona Agent, MD  ketoconazole (NIZORAL) 2 % shampoo Apply 1 Application topically 2 (two) times a week. 11/11/22   [provider]  levothyroxine  (SYNTHROID ) 100 MCG tablet Take 100 mcg by mouth daily before breakfast.    [provider]  Magnesium  250 MG TABS Take 250 mg by mouth daily.    [provider]  metFORMIN  (GLUCOPHAGE -XR) 500 MG 24 hr tablet Take 500 mg by mouth 2 (two) times daily. 10/22/22   [provider]  metoprolol  tartrate (LOPRESSOR ) 50 MG tablet Take 1 tablet (50 mg total) by mouth 2 (two) times daily. 11/12/23   Fenton, Clint R, PA  nitroGLYCERIN  (NITROSTAT ) 0.4 MG SL tablet Place 1 tablet (0.4 mg total) under the tongue every 5 (five) minutes as needed for chest pain. 08/02/21   Lavona Agent, MD  NOVOLIN 70/30 KWIKPEN (70-30) 100 UNIT/ML KwikPen Inject 14-25 Units into the skin See admin instructions. Take 24 mg in the morning 14 mg at lunch and 25 mg a bedtime 03/15/21   [provider]  pantoprazole  (PROTONIX ) 40 MG tablet TAKE 1 TABLET TWICE DAILY  BEFORE MEALS 12/16/23   Avram Lupita BRAVO, MD  pregabalin  (LYRICA ) 50 MG capsule Take 1 capsule (50 mg total) by  mouth 2 (two) times daily. 08/27/23   Kuneff, [name], DO  Probiotic Product (PROBIOTIC DAILY PO) Take 1 tablet by mouth daily.    [provider]    Allergies: Epinephrine , Other, Statins, Welchol  [colesevelam  hcl], and Amoxicillin    Review of Systems  Neurological:  Positive for dizziness.    Updated Vital Signs BP (!) 150/58 (BP Location: Left Arm)   Pulse (!) 59   Temp 97.8 F (36.6 C)   Resp 18   SpO2 97%   Physical Exam Vitals and nursing note reviewed.  Constitutional:      General: She is not in acute distress.    Appearance: She is not toxic-appearing.  HENT:      Head: Normocephalic and atraumatic.  Eyes:     General: No scleral icterus.    Conjunctiva/sclera: Conjunctivae normal.  Cardiovascular:     Rate and Rhythm: Normal rate and regular rhythm.     Pulses: Normal pulses.     Heart sounds: Normal heart sounds.  Pulmonary:     Effort: Pulmonary effort is normal. No respiratory distress.     Breath sounds: Normal breath sounds.  Abdominal:     General: Abdomen is flat. Bowel sounds are normal.     Palpations: Abdomen is soft.     Tenderness: There is no abdominal tenderness.  Skin:    General: Skin is warm and dry.     Findings: No lesion.  Neurological:     General: No focal deficit present.     Mental Status: She is alert and oriented to person, place, and time. Mental status is at baseline.     Comments: Alert and oriented answering questions appropriately with no slurred speech.  No facial droop.  No nystagmus send no visual field deficits.  Pupils are equal and reactive.  No ataxia with finger-to-nose.  Upper extremity sensation and strength intact bilaterally.     (all labs ordered are listed, but only abnormal results are displayed) Labs Reviewed  APTT - Abnormal; Notable for the following components:      Result Value   aPTT 46 (*)    All other components within normal limits  CBC - Abnormal; Notable for the following components:   Hemoglobin 11.6 (*)    HCT 35.3 (*)    All other components within normal limits  URINALYSIS, ROUTINE W REFLEX MICROSCOPIC - Abnormal; Notable for the following components:   Color, Urine STRAW (*)    Glucose, UA 50 (*)    All other components within normal limits  CBG MONITORING, ED - Abnormal; Notable for the following components:   Glucose-Capillary 153 (*)    All other components within normal limits  DIFFERENTIAL  COMPREHENSIVE METABOLIC PANEL WITH GFR    EKG: None  Radiology: No results found.   Procedures   Medications Ordered in the ED  sodium chloride  flush (NS) 0.9 %  injection 3 mL (has no administration in time range)  meclizine  (ANTIVERT ) tablet 25 mg (has no administration in time range)    Clinical Course as of 12/24/23 1600  Wed Dec 24, 2023  1332 Reassessed, patient reports no improvement in symptoms after receiving meclizine  here.  Will trial p.o. Ativan . Will order brain MRI.  [JB]    Clinical Course User Index [JB] Karin Griffith, Warren SAILOR, PA-C  Medical Decision Making Amount and/or Complexity of Data Reviewed Labs: ordered. Radiology: ordered.  Risk Prescription drug management.   This patient presents to the ED for concern of dizziness, this involves an extensive number of treatment options, and is a complaint that carries with it a high risk of complications and morbidity.  The differential diagnosis includes CVA, peripheral vertigo, dehydration, electrolyte abnormality   Co morbidities that complicate the patient evaluation  Afib on Pradaxa  HTN, HLD   Additional history obtained:  Additional history obtained from recent cardiology apt   Lab Tests:  I personally interpreted labs.  The pertinent results include:   CBC without leukocytosis.  Hemoglobin is 11.6 which is comparable to prior labs.  She has normal kidney function.  No electrolyte abnormality.  Blood glucose is 150.  UA is unremarkable   Imaging Studies ordered:  I ordered imaging studies including CT head I independently visualized and interpreted imaging which showed no acute findings I agree with the radiologist interpretation Given persistent dizziness will order MRI to rule out acute stroke   Cardiac Monitoring: / EKG:  The patient was maintained on a cardiac monitor.    Problem List / ED Course / Critical interventions / Medication management  Patient reports to emergency room with complaint of dizziness and nausea.  She said this started last night around 10 PM and became constant.  She is hemodynamically stable and  well-appearing.  She denies chest pain, headache or other symptoms.  She has nonfocal neurological exam.  Describes dizziness as a spinning sensation.  Will get head CT to rule out acute findings and trial meclizine . Meclizine  did not improve symptoms.  Will order MR to rule out acute stroke.  Will check orthostatics. I ordered medication including meclizine  Reevaluation of the patient after these medicines showed that the patient stayed the same I have reviewed the patients home medicines and have made adjustments as needed Patient signed off to oncoming PA pending MRI, reassessment.     Final diagnoses:  Dizziness    ED Discharge Orders     None          Shermon Warren LOISE DEVONNA 12/24/23 1601    Doretha Folks, MD 12/26/23 2311

## 2023-12-24 NOTE — ED Notes (Signed)
 Pt ambulated to restroom with minimal assistance. Tolerated well, c/o of slight dizziness and weakness but overall felt better. MD made aware.

## 2023-12-24 NOTE — ED Notes (Signed)
 Patient transported to MRI

## 2023-12-24 NOTE — ED Notes (Signed)
 Pt urinated in bedpan. Gave pt graham crackers, peanut butter, and apple sauce. Pt is ready for MRI.

## 2023-12-30 ENCOUNTER — Encounter: Payer: Self-pay | Admitting: Family Medicine

## 2023-12-30 ENCOUNTER — Ambulatory Visit (INDEPENDENT_AMBULATORY_CARE_PROVIDER_SITE_OTHER): Admitting: Family Medicine

## 2023-12-30 VITALS — BP 134/84 | HR 66 | Temp 97.9°F | Wt 182.0 lb

## 2023-12-30 DIAGNOSIS — R42 Dizziness and giddiness: Secondary | ICD-10-CM

## 2023-12-30 DIAGNOSIS — R9082 White matter disease, unspecified: Secondary | ICD-10-CM

## 2023-12-30 DIAGNOSIS — D509 Iron deficiency anemia, unspecified: Secondary | ICD-10-CM

## 2023-12-30 DIAGNOSIS — I10 Essential (primary) hypertension: Secondary | ICD-10-CM

## 2023-12-30 DIAGNOSIS — R2681 Unsteadiness on feet: Secondary | ICD-10-CM | POA: Diagnosis not present

## 2023-12-30 DIAGNOSIS — R7309 Other abnormal glucose: Secondary | ICD-10-CM

## 2023-12-30 DIAGNOSIS — H814 Vertigo of central origin: Secondary | ICD-10-CM

## 2023-12-30 MED ORDER — VALSARTAN 40 MG PO TABS: 40.0000 mg | ORAL_TABLET | Freq: Every day | ORAL | 1 refills | Status: DC

## 2023-12-30 NOTE — Progress Notes (Unsigned)
 [name] [name] , 04-30-1956, 68 y.o., female MRN: [id number] Patient Care Team    Relationship Specialty Notifications Start End  Catherine [name] LABOR, DO PCP - General Family Medicine  06/03/18   [name] Agent, MD PCP - Cardiology Cardiology Admissions 11/14/17   [name] Lupita BRAVO, MD Consulting Physician Gastroenterology  06/05/18   [name] [name], Praveen, MD Consulting Physician Pulmonary Disease  06/05/18   [name] Pears, MD Referring Physician Endocrinology  09/07/20   [name] Purchase, MD Consulting Physician Orthopedic Surgery  10/31/21   [name] Reena HERO, RN (Inactive) Registered Nurse   02/27/22     Chief Complaint  Patient presents with   Hospitalization Follow-up     Subjective: [name] [name] is a 68 y.o. Pt presents for an OV with complaints of dizziness with ED visit 12/24/2023.  ED visit notes reviewed in edition all labs or imaging that was completed was also reviewed.   Patient was found to be mildly anemic with hemoglobin 11.6, low magnesium  at 1.3, she was provided with IV magnesium  supplementation.  CT and MRI without acute abnormalities.  UA without infection.  EKG without changes, normal sinus rhythm no ST or T wave abnormalities.  She was provided with meclizine  and Ativan .  She was then provided with Reglan  and Benadryl  with significant improvement of dizziness and was in full to then ambulate after the migraine cocktail and IVF. ** EKG Interpretation Date/Time:                  Wednesday December 24 2023 12:57:34 EDT Ventricular Rate:         58 PR Interval:                 182 QRS Duration:             96 QT Interval:                 474 QTC Calculation:466 R Axis:                         67   Text Interpretation:Sinus rhythm Low voltage, precordial leads Borderline T abnormalities, anterior leads No significant change since last tracing Confirmed by [name] [name] ([id number]) on 12/24/2023 1:29:41 PM    12/30/2023    8:16 AM 04/16/2023    3:43 PM 04/11/2022    2:16 PM  04/04/2021   11:48 AM 09/06/2020   11:25 AM  Depression screen PHQ 2/9  Decreased Interest 2 0 0 0 0  Down, Depressed, Hopeless 3 0 0 0 0  PHQ - 2 Score 5 0 0 0 0  Altered sleeping 0 0     Tired, decreased energy 3 0     Change in appetite 0 0     Feeling bad or failure about yourself  0 0     Trouble concentrating 3 0     Moving slowly or fidgety/restless 0 0     Suicidal thoughts 0 0     PHQ-9 Score 11 0     Difficult doing work/chores Somewhat difficult Not difficult at all       Allergies  Allergen Reactions   Epinephrine  Other (See Comments)    Pass out    Other     Other reaction(s): Myalgia   Statins Other (See Comments)    Myalgias: Simvastatin also caused back ache   Welchol  [Colesevelam  Hcl]     myalgia   Amoxicillin Itching and  Rash    Has patient had a PCN reaction causing immediate rash, facial/tongue/throat swelling, SOB or lightheadedness with hypotension: No Has patient had a PCN reaction causing severe rash involving mucus membranes or skin necrosis: No Has patient had a PCN reaction that required hospitalization: No Has patient had a PCN reaction occurring within the last 10 years: Yes If all of the above answers are NO, then may proceed with Cephalosporin use.    Social History   Social History Narrative   Marital status/children/pets: Married, 1 child.    Education/employment: HS   Safety:      -smoke alarm in the home:Yes     - wears seatbelt: Yes     - Feels safe in their relationships: Yes   Past Medical History:  Diagnosis Date   Accelerating angina (HCC) 11/04/2017   Allergy     Asthma    Atrial fibrillation (HCC) 08/27/2020   Bone spur 10/11/2021   Cataract    Chronic kidney disease    Chronic pain disorder    neck and shoulder   Chronic pain of right upper extremity 11/10/2017   Coronary artery disease 2019   Stent x2   CTS (carpal tunnel syndrome) 01/27/2013   right   Diabetes mellitus type II    Diabetic gastroparesis (HCC)     Dysrhythmia    A. Fib   Esophageal reflux 08/25/2013   [place], Dr Norvell   FATTY LIVER DISEASE 04/27/2007   Qualifier: Diagnosis of  By: Anice CMA, Darlene     Focal muscle atrophy 09/26/2015   Fundic gland polyps of stomach, benign    Globus sensation 04/13/2018   Hordeolum externum of right upper eyelid 12/24/2021   HTN (hypertension)    Hx of adenomatous colonic polyps 12/10/2021   Hyperlipidemia, mixed 05/04/2007   Qualifier: Diagnosis of  By: [name] [name] crestor  caused myalgias even at low dose Livalo  caused myalgias Lipitor  Mother with severe reaction myalgias Simvastatin, Welchol     Hypokalemia 05/25/2013   Improved stopping HCTZ. Was noted to have an elevated glucose when K was low. Recheck renal next week after starting Maxzide   Hypomagnesemia 08/10/2014   Hypothyroidism    Iron  deficiency anemia 08/07/2018   Iron  malabsorption 08/07/2018   Laryngitis 08/25/2017   Leg cramps 04/15/2016   Nausea without vomiting 08/08/2014   NONSPEC ELEVATION OF LEVELS OF TRANSAMINASE/LDH 05/01/2007   Qualifier: Diagnosis of  By: Eyvonne Jacob     Osteoarthritis    chronic, right knee (11/04/2017)   Plantar fasciitis    Pneumonia    Rosacea 10/22/2016   Urine incontinence    Past Surgical History:  Procedure Laterality Date   ANTERIOR CERVICAL DECOMP/DISCECTOMY FUSION N/A 11/25/2022   Procedure: ANTERIOR CERVICAL DISCECTOMY AND FUSION CERVICAL FIVE TO SEVEN;  Surgeon: [name] Aures, MD;  Location: [facility] OR;  Service: Orthopedics;  Laterality: N/A;   AUGMENTATION MAMMAPLASTY Bilateral 2002   BLADDER SUSPENSION  1981   CATARACT EXTRACTION W/ INTRAOCULAR LENS IMPLANT Bilateral    COLONOSCOPY     CORONARY PRESSURE/FFR STUDY N/A 11/04/2017   Procedure: INTRAVASCULAR PRESSURE WIRE/FFR STUDY;  Surgeon: [name] Bruckner, MD;  Location: [facility] INVASIVE CV [facility];  Service: Cardiovascular;  Laterality: N/A;   CORONARY STENT INTERVENTION N/A 11/04/2017   Procedure: CORONARY STENT  INTERVENTION;  Surgeon: [name] Bruckner, MD;  Location: [facility] INVAS[facility];  Service: Cardiovascular;  Laterality: N/A;   CORONARY ULTRASOUND/IVUS N/A 11/04/2017   Procedure: Intravascular Ultrasound/IVUS;  Surgeon: [name] Bruckner, MD;  Location: [facility];  Service: Cardiovascular;  Laterality: N/A;   ENDOVENOUS ABLATION SAPHENOUS VEIN W/ LASER Left 12/29/2019   endovenous laser ablation left greater saphenous vein by [name] Haddock MD    ESOPHAGOGASTRODUODENOSCOPY     KNEE ARTHROSCOPY Right 1998, [id number]   LAPAROSCOPIC CHOLECYSTECTOMY  2007   LEFT HEART CATH AND CORONARY ANGIOGRAPHY N/A 11/04/2017   Procedure: LEFT HEART CATH AND CORONARY ANGIOGRAPHY;  Surgeon: [name] Bruckner, MD;  Location: [facility];  Service: Cardiovascular;  Laterality: N/A;   LEFT HEART CATHETERIZATION WITH CORONARY ANGIOGRAM N/A 11/21/2011   Procedure: LEFT HEART CATHETERIZATION WITH CORONARY ANGIOGRAM;  Surgeon: [name] JAYSON Emmer, MD;  Location: [facility] CATH LAB;  Service: Cardiovascular;  Laterality: N/A;   TONSILLECTOMY  1975   VAGINAL HYSTERECTOMY  1983   still have my ovaries   Family History  Problem Relation Age of Onset   Alzheimer's disease Mother    Diabetes type II Mother    Hiatal hernia Mother    Diabetes Mother    Emphysema Father    COPD Father    Depression Sister        suicide   Diabetes Sister    Diabetes Daughter    Colon cancer Neg Hx    Stomach cancer Neg Hx    Esophageal cancer Neg Hx    Rectal cancer Neg Hx    Allergies as of 12/30/2023       Reactions   Epinephrine  Other (See Comments)   Pass out    Other    Other reaction(s): Myalgia   Statins Other (See Comments)   Myalgias: Simvastatin also caused back ache   Welchol  [colesevelam  Hcl]    myalgia   Amoxicillin Itching, Rash   Has patient had a PCN reaction causing immediate rash, facial/tongue/throat swelling, SOB or lightheadedness with hypotension: No Has patient had a PCN reaction causing severe rash  involving mucus membranes or skin necrosis: No Has patient had a PCN reaction that required hospitalization: No Has patient had a PCN reaction occurring within the last 10 years: Yes If all of the above answers are NO, then may proceed with Cephalosporin use.        Medication List        Accurate as of December 30, 2023  9:06 AM. If you have any questions, ask your nurse or doctor.          CALTRATE 600 PO Take 650 mg by mouth daily.   dabigatran  150 MG Caps capsule Commonly known as: Pradaxa  Take 1 capsule (150 mg total) by mouth 2 (two) times daily.   ezetimibe  10 MG tablet Commonly known as: ZETIA  Take 10 mg by mouth daily.   fenofibrate  160 MG tablet TAKE 1 TABLET DAILY   fexofenadine 180 MG tablet Commonly known as: ALLEGRA Take 180 mg by mouth daily.   isosorbide  mononitrate 30 MG 24 hr tablet Commonly known as: IMDUR  Take 1 tablet (30 mg total) by mouth daily.   ketoconazole 2 % shampoo Commonly known as: NIZORAL Apply 1 Application topically 2 (two) times a week.   LEQVIO  [place]son Inject into the skin.   levothyroxine  100 MCG tablet Commonly known as: SYNTHROID  Take 100 mcg by mouth daily before breakfast.   Magnesium  250 MG Tabs Take 250 mg by mouth daily.   meclizine  25 MG tablet Commonly known as: ANTIVERT  Take 1 tablet (25 mg total) by mouth 3 (three) times daily as needed for dizziness.   metFORMIN  500 MG 24 hr  tablet Commonly known as: GLUCOPHAGE -XR Take 500 mg by mouth 2 (two) times daily.   metoprolol  tartrate 50 MG tablet Commonly known as: LOPRESSOR  Take 1 tablet (50 mg total) by mouth 2 (two) times daily.   nitroGLYCERIN  0.4 MG SL tablet Commonly known as: NITROSTAT  Place 1 tablet (0.4 mg total) under the tongue every 5 (five) minutes as needed for chest pain.   NovoLIN 70/30 Kwikpen (70-30) 100 UNIT/ML KwikPen Generic drug: insulin  isophane & regular human KwikPen Inject 14-25 Units into the skin See admin instructions. Take 24 mg  in the morning 14 mg at lunch and 25 mg a bedtime   pantoprazole  40 MG tablet Commonly known as: PROTONIX  TAKE 1 TABLET TWICE DAILY  BEFORE MEALS   pregabalin  50 MG capsule Commonly known as: LYRICA  Take 1 capsule (50 mg total) by mouth 2 (two) times daily.   PROBIOTIC DAILY PO Take 1 tablet by mouth daily.   Refresh Optive Mega-3 0.5-1-0.5 % Soln Generic drug: Carboxymeth-Glyc-Polysorb PF Place 1 drop into both eyes 3 (three) times daily.   valsartan  40 MG tablet Commonly known as: DIOVAN  Take 1 tablet (40 mg total) by mouth daily. Started by: [name] [name]   Vitamin B12 1000 MCG Tbcr 1 tablet Orally Once a day; Duration: 30 day(s)        All past medical history, surgical history, allergies, family history, immunizations andmedications were updated in the EMR today and reviewed under the history and medication portions of their EMR.     ROS Negative, with the exception of above mentioned in HPI   Objective:  BP 134/84   Pulse 66   Temp 97.9 F (36.6 C)   Wt 182 lb (82.6 kg)   SpO2 97%   BMI 30.29 kg/m  Body mass index is 30.29 kg/m. Physical Exam ***  No results found. No results found. No results found for this or any previous visit (from the past 24 hours).  Assessment/Plan: JAIMYA FELICIANO is a 68 y.o. female present for OV for  *** Reviewed expectations re: course of current medical issues. Discussed self-management of symptoms. Outlined signs and symptoms indicating need for more acute intervention. Patient verbalized understanding and all questions were answered. Patient received an After-Visit Summary.    Orders Placed This Encounter  Procedures   Vitamin B1   Meds ordered this encounter  Medications   valsartan  (DIOVAN ) 40 MG tablet    Sig: Take 1 tablet (40 mg total) by mouth daily.    Dispense:  90 tablet    Refill:  1   Referral Orders  No referral(s) requested today     Note is dictated utilizing voice recognition software.  Although note has been proof read prior to signing, occasional typographical errors still can be missed. If any questions arise, please do not hesitate to call for verification.   electronically signed by:  [name] Bellini, DO   Primary Care - OR

## 2023-12-30 NOTE — Patient Instructions (Signed)
 Return in about 2 weeks (around 01/13/2024) for vertigo.        Great to see you today.  I have refilled the medication(s) we provide.   If labs were collected or images ordered, we will inform you of  results once we have received them and reviewed. We will contact you either by echart message, or telephone call.  Please give ample time to the testing facility, and our office to run,  receive and review results. Please do not call inquiring of results, even if you can see them in your chart. We will contact you as soon as we are able. If it has been over 1 week since the test was completed, and you have not yet heard from us , then please call us .    - echart message- for normal results that have been seen by the patient already.   - telephone call: abnormal results or if patient has not viewed results in their echart.  If a referral to a specialist was entered for you, please call us  in 2 weeks if you have not heard from the specialist office to schedule.

## 2024-01-01 ENCOUNTER — Telehealth: Payer: Self-pay | Admitting: Family Medicine

## 2024-01-01 DIAGNOSIS — H814 Vertigo of central origin: Secondary | ICD-10-CM | POA: Insufficient documentation

## 2024-01-01 DIAGNOSIS — R2681 Unsteadiness on feet: Secondary | ICD-10-CM | POA: Insufficient documentation

## 2024-01-01 DIAGNOSIS — R9082 White matter disease, unspecified: Secondary | ICD-10-CM | POA: Insufficient documentation

## 2024-01-01 NOTE — Telephone Encounter (Signed)
 Please call patient: Unfortunately there was a issue with the labs for her during her visit.  We need to recollect labs.  I have placed future orders in the system.  Please have home and at home for his possible convenience to have recollection.

## 2024-01-02 ENCOUNTER — Encounter: Payer: Self-pay | Admitting: Family Medicine

## 2024-01-02 ENCOUNTER — Other Ambulatory Visit (INDEPENDENT_AMBULATORY_CARE_PROVIDER_SITE_OTHER)

## 2024-01-02 DIAGNOSIS — R42 Dizziness and giddiness: Secondary | ICD-10-CM

## 2024-01-02 DIAGNOSIS — R7309 Other abnormal glucose: Secondary | ICD-10-CM

## 2024-01-02 DIAGNOSIS — D509 Iron deficiency anemia, unspecified: Secondary | ICD-10-CM

## 2024-01-02 LAB — COMPREHENSIVE METABOLIC PANEL WITH GFR
ALT: 23 U/L (ref 0–35)
AST: 24 U/L (ref 0–37)
Albumin: 4.3 g/dL (ref 3.5–5.2)
Alkaline Phosphatase: 51 U/L (ref 39–117)
BUN: 13 mg/dL (ref 6–23)
CO2: 28 meq/L (ref 19–32)
Calcium: 9.1 mg/dL (ref 8.4–10.5)
Chloride: 103 meq/L (ref 96–112)
Creatinine, Ser: 1.01 mg/dL (ref 0.40–1.20)
GFR: 56.9 mL/min — ABNORMAL LOW (ref >60.00)
Glucose, Bld: 214 mg/dL — ABNORMAL HIGH (ref 70–99)
Potassium: 4.7 meq/L (ref 3.5–5.1)
Sodium: 140 meq/L (ref 135–145)
Total Bilirubin: 0.4 mg/dL (ref 0.2–1.2)
Total Protein: 6.4 g/dL (ref 6.0–8.3)

## 2024-01-02 LAB — HEMOGLOBIN A1C: Hgb A1c MFr Bld: 7.2 % — ABNORMAL HIGH (ref 4.6–6.5)

## 2024-01-02 LAB — IBC + FERRITIN
Ferritin: 31.1 ng/mL (ref 10.0–291.0)
Iron: 56 ug/dL (ref 42–145)
Saturation Ratios: 11.4 % — ABNORMAL LOW (ref 20.0–50.0)
TIBC: 491.4 ug/dL — ABNORMAL HIGH (ref 250.0–450.0)
Transferrin: 351 mg/dL (ref 212.0–360.0)

## 2024-01-02 LAB — MAGNESIUM: Magnesium: 1.5 mg/dL (ref 1.5–2.5)

## 2024-01-02 LAB — VITAMIN D 25 HYDROXY (VIT D DEFICIENCY, FRACTURES): VITD: 36.64 ng/mL (ref 30.00–100.00)

## 2024-01-02 NOTE — Telephone Encounter (Signed)
 Pt made aware. Pt scheduled for today at 10:30.

## 2024-01-02 NOTE — Telephone Encounter (Signed)
 See telephone note

## 2024-01-03 LAB — PTH, INTACT AND CALCIUM
Calcium: 9.4 mg/dL (ref 8.6–10.4)
PTH: 14 pg/mL — ABNORMAL LOW (ref 16–77)

## 2024-01-05 ENCOUNTER — Ambulatory Visit: Payer: Self-pay | Admitting: Family Medicine

## 2024-01-05 LAB — VITAMIN B1: Vitamin B1 (Thiamine): 12 nmol/L (ref 8–30)

## 2024-01-05 NOTE — Telephone Encounter (Signed)
 Please call patient A1c Elevated from prior, certainly not enough to discuss symptoms she is experiencing.  A1c is 7.4.  Her sugar was 214 at the time of collection which is significantly elevated. B1, calcium  and vitamin D  levels are normal. Parathyroid hormone mildly low, but with normal calcium  and vit d, this is ok. Iron  levels are on the lower end of normal with an iron  of 56 and a ferritin of 31 with low saturations.  This is considered mildly iron  deficient- could start an OTC iron  supplement a few times a week (caution on constipation side effects) and /or Good plant sources of iron  are lentils, chickpeas, beans, tofu, cashew nuts, pumpkin seeds, kale, dried apricots and figs, raisins, quinoa and fortified breakfast cereal.  No obvious cause for her symptoms revealed by labs

## 2024-01-06 NOTE — Telephone Encounter (Signed)
 I would recommend she speak to her endocrinologist about her A1c and if they want to adjust her insulin .  Her A1c is not high enough to contribute to her current symptoms, which is why we did complete the A1c for her. I provided her with a plant-based iron  fortified foods.  I would recommend she try to incorporate some of them into her normal routine.  Her iron  levels are not low enough to need a transfusion at this time.   She has a follow-up scheduled in 1 week, we will discuss more at that time.

## 2024-01-13 ENCOUNTER — Ambulatory Visit (INDEPENDENT_AMBULATORY_CARE_PROVIDER_SITE_OTHER): Admitting: Family Medicine

## 2024-01-13 ENCOUNTER — Encounter: Payer: Self-pay | Admitting: Family Medicine

## 2024-01-13 VITALS — BP 128/78 | HR 67 | Temp 97.8°F | Wt 184.0 lb

## 2024-01-13 DIAGNOSIS — H814 Vertigo of central origin: Secondary | ICD-10-CM | POA: Diagnosis not present

## 2024-01-13 DIAGNOSIS — R9082 White matter disease, unspecified: Secondary | ICD-10-CM

## 2024-01-13 DIAGNOSIS — E785 Hyperlipidemia, unspecified: Secondary | ICD-10-CM | POA: Diagnosis not present

## 2024-01-13 DIAGNOSIS — E1169 Type 2 diabetes mellitus with other specified complication: Secondary | ICD-10-CM

## 2024-01-13 DIAGNOSIS — R400 Somnolence: Secondary | ICD-10-CM | POA: Diagnosis not present

## 2024-01-13 DIAGNOSIS — R5383 Other fatigue: Secondary | ICD-10-CM | POA: Diagnosis not present

## 2024-01-13 LAB — MICROALBUMIN / CREATININE URINE RATIO
Creatinine,U: 134.1 mg/dL
Microalb Creat Ratio: 10.2 mg/g (ref 0.0–30.0)
Microalb, Ur: 1.4 mg/dL (ref 0.0–1.9)

## 2024-01-13 NOTE — Progress Notes (Signed)
 [name] [name] , 03-04-1956, 68 y.o., female MRN: [id number] Patient Care Team    Relationship Specialty Notifications Start End  Catherine Charlies LABOR, DO PCP - General Family Medicine  06/03/18   Lavona Agent, MD PCP - Cardiology Cardiology Admissions 11/14/17   Avram Lupita BRAVO, MD Consulting Physician Gastroenterology  06/05/18   Mannam, Praveen, MD Consulting Physician Pulmonary Disease  06/05/18   Tommas Pears, MD Referring Physician Endocrinology  09/07/20   Duwayne Purchase, MD Consulting Physician Orthopedic Surgery  10/31/21   Berniece Reena HERO, RN (Inactive) Registered Nurse   02/27/22     Chief Complaint  Patient presents with   Dizziness    2 wk f/u. Feeling improvement, dizziness gets worse at night.      Subjective: [name] [name] is a 69 y.o. Pt presents for an OV to follow-up on rather severe vertigo experienced approximately 2-3 weeks ago.  See prior note below for full details. Today patient reports she has noticed some improvement with her vertigo.  Balance has improved as well.  She is no longer requiring the assistance of a cane.  She reports the vertigo is still present mostly in the nighttime and in the early morning hours. Patient has neurology referral in place, they were unable to get her in until October.  Labs completed at last visit did not show cause of vertigo symptoms iron  on the low end of normal, A1c mildly elevated at 7.4 and diabetic, vitamin D  normal electrolytes normal  Prior note: With complaints of dizziness with ED visit 12/24/2023.  ED visit notes reviewed in edition all labs or imaging that was completed was also reviewed.   Patient was found to be mildly anemic with hemoglobin 11.6, low magnesium  at 1.3, she was provided with IV magnesium  supplementation.  CT and MRI without acute abnormalities.  UA without infection.  EKG without changes, normal sinus rhythm no ST or T wave abnormalities.  She was provided with meclizine  and Ativan .  She was then  provided with Reglan  and Benadryl  with mild improvement of dizziness and was able to ambulate with assistance for discharge  Patient reports the vertigo has been persistent for over 2 weeks.  She is unsteady in her gait and now needing to walk with a cane and have her husband assist her.  She reports vertigo is present persistently throughout the day.  She will have minimal relief usually after dinner around 8 PM until bedtime.  However she wakes in the middle of the night with vertigo symptoms. She denies any recent illnesses. She denies any chest pain, palpitations, headaches or shortness of breath. She did receive her first injection of leqivo from 7/21 with ED visit on 7/30.  She reports the symptoms did start around the time of her injection.   Hypertension: Patient was having mildly elevated blood pressures, which was new for her.  Started Diovan  40 mg last visit patient reports compliance. Patient denies chest pain, shortness of breath, dizziness or lower extremity edema.   MRI 12/24/2023 IMPRESSION: 1. Mild periventricular and deep cerebral white matter disease. No apparent acute process. EKG Interpretation Date/Time:                  Wednesday December 24 2023 12:57:34 EDT Ventricular Rate:         58 PR Interval:                 182 QRS Duration:  96 QT Interval:                 474 QTC Calculation:466 R Axis:                         67   Text Interpretation:Sinus rhythm Low voltage, precordial leads Borderline T abnormalities, anterior leads No significant change since last tracing Confirmed by Doretha Folks ([id number]) on 12/24/2023 1:29:41 PM    12/30/2023    8:16 AM 04/16/2023    3:43 PM 04/11/2022    2:16 PM 04/04/2021   11:48 AM 09/06/2020   11:25 AM  Depression screen PHQ 2/9  Decreased Interest 2 0 0 0 0  Down, Depressed, Hopeless 3 0 0 0 0  PHQ - 2 Score 5 0 0 0 0  Altered sleeping 0 0     Tired, decreased energy 3 0     Change in appetite 0 0     Feeling  bad or failure about yourself  0 0     Trouble concentrating 3 0     Moving slowly or fidgety/restless 0 0     Suicidal thoughts 0 0     PHQ-9 Score 11 0     Difficult doing work/chores Somewhat difficult Not difficult at all       Allergies  Allergen Reactions   Epinephrine  Other (See Comments)    Pass out    Other     Other reaction(s): Myalgia   Statins Other (See Comments)    Myalgias: Simvastatin also caused back ache   Welchol  [Colesevelam  Hcl]     myalgia   Amoxicillin Itching and Rash    Has patient had a PCN reaction causing immediate rash, facial/tongue/throat swelling, SOB or lightheadedness with hypotension: No Has patient had a PCN reaction causing severe rash involving mucus membranes or skin necrosis: No Has patient had a PCN reaction that required hospitalization: No Has patient had a PCN reaction occurring within the last 10 years: Yes If all of the above answers are NO, then may proceed with Cephalosporin use.    Social History   Social History Narrative   Marital status/children/pets: Married, 1 child.    Education/employment: HS   Safety:      -smoke alarm in the home:Yes     - wears seatbelt: Yes     - Feels safe in their relationships: Yes   Past Medical History:  Diagnosis Date   Accelerating angina (HCC) 11/04/2017   Allergy     Asthma    Atrial fibrillation (HCC) 08/27/2020   Bone spur 10/11/2021   Cataract    Chronic kidney disease    Chronic pain disorder    neck and shoulder   Chronic pain of right upper extremity 11/10/2017   Coronary artery disease 2019   Stent x2   CTS (carpal tunnel syndrome) 01/27/2013   right   Diabetes mellitus type II    Diabetic gastroparesis (HCC)    Dysrhythmia    A. Fib   Esophageal reflux 08/25/2013   [place], Dr Norvell   FATTY LIVER DISEASE 04/27/2007   Qualifier: Diagnosis of  By: Anice CMA, Darlene     Focal muscle atrophy 09/26/2015   Fundic gland polyps of stomach, benign     Globus sensation 04/13/2018   Hordeolum externum of right upper eyelid 12/24/2021   HTN (hypertension)    Hx of adenomatous colonic polyps 12/10/2021   Hyperlipidemia, mixed 05/04/2007   Qualifier:  Diagnosis of  By: Georgian ROSALEA CHARM Lamar crestor  caused myalgias even at low dose Livalo  caused myalgias Lipitor  Mother with severe reaction myalgias Simvastatin, Welchol     Hypokalemia 05/25/2013   Improved stopping HCTZ. Was noted to have an elevated glucose when K was low. Recheck renal next week after starting Maxzide   Hypomagnesemia 08/10/2014   Hypothyroidism    Iron  deficiency anemia 08/07/2018   Iron  malabsorption 08/07/2018   Laryngitis 08/25/2017   Leg cramps 04/15/2016   Nausea without vomiting 08/08/2014   NONSPEC ELEVATION OF LEVELS OF TRANSAMINASE/LDH 05/01/2007   Qualifier: Diagnosis of  By: Eyvonne Jacob     Osteoarthritis    chronic, right knee (11/04/2017)   Plantar fasciitis    Pneumonia    Rosacea 10/22/2016   Urine incontinence    Past Surgical History:  Procedure Laterality Date   ANTERIOR CERVICAL DECOMP/DISCECTOMY FUSION N/A 11/25/2022   Procedure: ANTERIOR CERVICAL DISCECTOMY AND FUSION CERVICAL FIVE TO SEVEN;  Surgeon: Burnetta Aures, MD;  Location: [facility] OR;  Service: Orthopedics;  Laterality: N/A;   AUGMENTATION MAMMAPLASTY Bilateral 2002   BLADDER SUSPENSION  1981   CATARACT EXTRACTION W/ INTRAOCULAR LENS IMPLANT Bilateral    COLONOSCOPY     CORONARY PRESSURE/FFR STUDY N/A 11/04/2017   Procedure: INTRAVASCULAR PRESSURE WIRE/FFR STUDY;  Surgeon: Mady Bruckner, MD;  Location: [facility] INVASIVE CV LAB;  Service: Cardiovascular;  Laterality: N/A;   CORONARY STENT INTERVENTION N/A 11/04/2017   Procedure: CORONARY STENT INTERVENTION;  Surgeon: Mady Bruckner, MD;  Location: [facility] INVASIVE CV LAB;  Service: Cardiovascular;  Laterality: N/A;   CORONARY ULTRASOUND/IVUS N/A 11/04/2017   Procedure: Intravascular Ultrasound/IVUS;  Surgeon: Mady Bruckner, MD;  Location: [facility] INVASIVE  CV LAB;  Service: Cardiovascular;  Laterality: N/A;   ENDOVENOUS ABLATION SAPHENOUS VEIN W/ LASER Left 12/29/2019   endovenous laser ablation left greater saphenous vein by Carlin Haddock MD    ESOPHAGOGASTRODUODENOSCOPY     KNEE ARTHROSCOPY Right 1998, [id number]   LAPAROSCOPIC CHOLECYSTECTOMY  2007   LEFT HEART CATH AND CORONARY ANGIOGRAPHY N/A 11/04/2017   Procedure: LEFT HEART CATH AND CORONARY ANGIOGRAPHY;  Surgeon: Mady Bruckner, MD;  Location: [facility] INVASIVE CV [facility];  Service: Cardiovascular;  Laterality: N/A;   LEFT HEART CATHETERIZATION WITH CORONARY ANGIOGRAM N/A 11/21/2011   Procedure: LEFT HEART CATHETERIZATION WITH CORONARY ANGIOGRAM;  Surgeon: Maude JAYSON Emmer, MD;  Location: [facility] CATH LAB;  Service: Cardiovascular;  Laterality: N/A;   TONSILLECTOMY  1975   VAGINAL HYSTERECTOMY  1983   still have my ovaries   Family History  Problem Relation Age of Onset   Alzheimer's disease Mother    Diabetes type II Mother    Hiatal hernia Mother    Diabetes Mother    Emphysema Father    COPD Father    Depression Sister        suicide   Diabetes Sister    Diabetes Daughter    Colon cancer Neg Hx    Stomach cancer Neg Hx    Esophageal cancer Neg Hx    Rectal cancer Neg Hx    Allergies as of 01/13/2024       Reactions   Epinephrine  Other (See Comments)   Pass out    Other    Other reaction(s): Myalgia   Statins Other (See Comments)   Myalgias: Simvastatin also caused back ache   Welchol  [colesevelam  Hcl]    myalgia   Amoxicillin Itching, Rash   Has patient had a PCN reaction causing immediate rash, facial/tongue/throat swelling, SOB or lightheadedness  with hypotension: No Has patient had a PCN reaction causing severe rash involving mucus membranes or skin necrosis: No Has patient had a PCN reaction that required hospitalization: No Has patient had a PCN reaction occurring within the last 10 years: Yes If all of the above answers are NO, then may proceed with Cephalosporin  use.        Medication List        Accurate as of January 13, 2024  3:22 PM. If you have any questions, ask your nurse or doctor.          CALTRATE 600 PO Take 650 mg by mouth daily.   dabigatran  150 MG Caps capsule Commonly known as: Pradaxa  Take 1 capsule (150 mg total) by mouth 2 (two) times daily.   ezetimibe  10 MG tablet Commonly known as: ZETIA  Take 10 mg by mouth daily.   fenofibrate  160 MG tablet TAKE 1 TABLET DAILY   fexofenadine 180 MG tablet Commonly known as: ALLEGRA Take 180 mg by mouth daily.   isosorbide  mononitrate 30 MG 24 hr tablet Commonly known as: IMDUR  Take 1 tablet (30 mg total) by mouth daily.   ketoconazole 2 % shampoo Commonly known as: NIZORAL Apply 1 Application topically 2 (two) times a week.   LEQVIO  [place] Inject into the skin.   levothyroxine  100 MCG tablet Commonly known as: SYNTHROID  Take 100 mcg by mouth daily before breakfast.   Magnesium  250 MG Tabs Take 250 mg by mouth daily.   metFORMIN  500 MG 24 hr tablet Commonly known as: GLUCOPHAGE -XR Take 500 mg by mouth 2 (two) times daily.   metoprolol  tartrate 50 MG tablet Commonly known as: LOPRESSOR  Take 1 tablet (50 mg total) by mouth 2 (two) times daily.   nitroGLYCERIN  0.4 MG SL tablet Commonly known as: NITROSTAT  Place 1 tablet (0.4 mg total) under the tongue every 5 (five) minutes as needed for chest pain.   NovoLIN 70/30 Kwikpen (70-30) 100 UNIT/ML KwikPen Generic drug: insulin  isophane & regular human KwikPen Inject 14-25 Units into the skin See admin instructions. Take 24 mg in the morning 14 mg at lunch and 25 mg a bedtime   pantoprazole  40 MG tablet Commonly known as: PROTONIX  TAKE 1 TABLET TWICE DAILY  BEFORE MEALS   pregabalin  50 MG capsule Commonly known as: LYRICA  Take 1 capsule (50 mg total) by mouth 2 (two) times daily.   PROBIOTIC DAILY PO Take 1 tablet by mouth daily.   Refresh Optive Mega-3 0.5-1-0.5 % Soln Generic drug:  Carboxymeth-Glyc-Polysorb PF Place 1 drop into both eyes 3 (three) times daily.   valsartan  40 MG tablet Commonly known as: DIOVAN  Take 1 tablet (40 mg total) by mouth daily.   Vitamin B12 1000 MCG Tbcr 1 tablet Orally Once a day; Duration: 30 day(s)        All past medical history, surgical history, allergies, family history, immunizations andmedications were updated in the EMR today and reviewed under the history and medication portions of their EMR.     ROS Negative, with the exception of above mentioned in HPI   Objective:  BP 128/78   Pulse 67   Temp 97.8 F (36.6 C)   Wt 184 lb (83.5 kg)   SpO2 97%   BMI 30.62 kg/m  Body mass index is 30.62 kg/m. Physical Exam Vitals and nursing note reviewed.  Constitutional:      General: She is not in acute distress.    Appearance: Normal appearance. She is not ill-appearing, toxic-appearing or diaphoretic.  HENT:     Head: Normocephalic and atraumatic.  Eyes:     General: No scleral icterus.       Right eye: No discharge.        Left eye: No discharge.     Extraocular Movements: Extraocular movements intact.     Conjunctiva/sclera: Conjunctivae normal.     Pupils: Pupils are equal, round, and reactive to light.  Neck:     Comments: Increased vertigo with right head movement Cardiovascular:     Rate and Rhythm: Normal rate and regular rhythm.  Pulmonary:     Effort: Pulmonary effort is normal. No respiratory distress.     Breath sounds: Normal breath sounds. No wheezing, rhonchi or rales.  Musculoskeletal:     Right lower leg: No edema.     Left lower leg: No edema.  Skin:    General: Skin is warm.     Findings: No rash.  Neurological:     Mental Status: She is alert and oriented to person, place, and time. Mental status is at baseline.     Motor: No weakness.     Gait: Gait normal.  Psychiatric:        Mood and Affect: Mood normal.        Behavior: Behavior normal.        Thought Content: Thought content  normal.        Judgment: Judgment normal.    Diabetic Foot Exam - Simple   Simple Foot Form Diabetic Foot exam was performed with the following findings: Yes 01/13/2024  8:27 AM  Visual Inspection No deformities, no ulcerations, no other skin breakdown bilaterally: Yes Sensation Testing Intact to touch and monofilament testing bilaterally: Yes Pulse Check Posterior Tibialis and Dorsalis pulse intact bilaterally: Yes Comments     No results found. No results found. No results found for this or any previous visit (from the past 24 hours).  Assessment/Plan: [name] [name] is a 68 y.o. female present for OV for  Dizziness (Primary)/persistent vertigo/mild periventricular and deep cerebral white matter disease Uncertain etiology of vertigo.  It has become rather persistent.  She does not like taking  meclizine , it simply makes her sleepy but does not seem to take away the vertigo. Uncertain etiology-cannot rule out it is not from the new leqvio  injections, does not seem to be listed as a common side effect of medication, but does seem to have some reports of evidence and after studies less than 2%. MRI was reassuring for no acute processes such as stroke, it is positive for mild white matter disease.  Periventricular and deep. Will start with additional laboratory workup. -CMP, magnesium , iron , PTH, calcium , vitamin D  labs were reassuring. - vestibular rehabReferral placed - sleep study referral to pulmonology for sleep study -Neurology referral has been placed for her (not until October)   Hypertension: Improved Continue Diovan  40 mg daily   Reviewed expectations re: course of current medical issues. Discussed self-management of symptoms. Outlined signs and symptoms indicating need for more acute intervention. Patient verbalized understanding and all questions were answered. Patient received an After-Visit Summary.  42 minutes spent in patient encounter reviewing multiple  labs, imaging, ED note and further management required for significant persistent vertigo with new gait instability  Orders Placed This Encounter  Procedures   Urine Microalbumin w/creat. ratio   Ambulatory referral to Pulmonology   Ambulatory referral to Physical Therapy   PT vestibular rehab   No orders of the defined types were placed in  this encounter.  Referral Orders         Ambulatory referral to Pulmonology         Ambulatory referral to Physical Therapy       Note is dictated utilizing voice recognition software. Although note has been proof read prior to signing, occasional typographical errors still can be missed. If any questions arise, please do not hesitate to call for verification.   electronically signed by:  Charlies Bellini, DO  [facility] - [place]

## 2024-01-14 ENCOUNTER — Ambulatory Visit (INDEPENDENT_AMBULATORY_CARE_PROVIDER_SITE_OTHER): Admitting: Adult Health

## 2024-01-14 ENCOUNTER — Encounter: Payer: Self-pay | Admitting: Adult Health

## 2024-01-14 ENCOUNTER — Ambulatory Visit: Payer: Self-pay | Admitting: Family Medicine

## 2024-01-14 VITALS — BP 120/60 | HR 66 | Ht 65.5 in | Wt 183.8 lb

## 2024-01-14 DIAGNOSIS — G4719 Other hypersomnia: Secondary | ICD-10-CM

## 2024-01-14 DIAGNOSIS — I480 Paroxysmal atrial fibrillation: Secondary | ICD-10-CM

## 2024-01-14 DIAGNOSIS — E11649 Type 2 diabetes mellitus with hypoglycemia without coma: Secondary | ICD-10-CM | POA: Diagnosis not present

## 2024-01-14 DIAGNOSIS — G4710 Hypersomnia, unspecified: Secondary | ICD-10-CM

## 2024-01-14 DIAGNOSIS — J310 Chronic rhinitis: Secondary | ICD-10-CM | POA: Diagnosis not present

## 2024-01-14 DIAGNOSIS — Z794 Long term (current) use of insulin: Secondary | ICD-10-CM

## 2024-01-14 DIAGNOSIS — I10 Essential (primary) hypertension: Secondary | ICD-10-CM

## 2024-01-14 DIAGNOSIS — R42 Dizziness and giddiness: Secondary | ICD-10-CM

## 2024-01-14 DIAGNOSIS — H814 Vertigo of central origin: Secondary | ICD-10-CM

## 2024-01-14 NOTE — Therapy (Signed)
 OUTPATIENT PHYSICAL THERAPY VESTIBULAR EVALUATION     Patient Name: [name] [name] MRN: [id number] DOB:[birth year], 68 y.o., female Today's Date: 01/18/2024  END OF SESSION:   Past Medical History:  Diagnosis Date   Accelerating angina (HCC) 11/04/2017   Allergy     Asthma    Atrial fibrillation (HCC) 08/27/2020   Bone spur 10/11/2021   Cataract    Chronic kidney disease    Chronic pain disorder    neck and shoulder   Chronic pain of right upper extremity 11/10/2017   Coronary artery disease 2019   Stent x2   CTS (carpal tunnel syndrome) 01/27/2013   right   Diabetes mellitus type II    Diabetic gastroparesis (HCC)    Dysrhythmia    A. Fib   Esophageal reflux 08/25/2013   [place], Dr Norvell   FATTY LIVER DISEASE 04/27/2007   Qualifier: Diagnosis of  By: Anice CMA, Darlene     Focal muscle atrophy 09/26/2015   Fundic gland polyps of stomach, benign    Globus sensation 04/13/2018   Hordeolum externum of right upper eyelid 12/24/2021   HTN (hypertension)    Hx of adenomatous colonic polyps 12/10/2021   Hyperlipidemia, mixed 05/04/2007   Qualifier: Diagnosis of  By: Georgian ROSALEA CHARM Lamar crestor  caused myalgias even at low dose Livalo  caused myalgias Lipitor  Mother with severe reaction myalgias Simvastatin, Welchol     Hypokalemia 05/25/2013   Improved stopping HCTZ. Was noted to have an elevated glucose when K was low. Recheck renal next week after starting Maxzide   Hypomagnesemia 08/10/2014   Hypothyroidism    Iron  deficiency anemia 08/07/2018   Iron  malabsorption 08/07/2018   Laryngitis 08/25/2017   Leg cramps 04/15/2016   Nausea without vomiting 08/08/2014   NONSPEC ELEVATION OF LEVELS OF TRANSAMINASE/LDH 05/01/2007   Qualifier: Diagnosis of  By: Eyvonne Jacob     Osteoarthritis    chronic, right knee (11/04/2017)   Plantar fasciitis    Pneumonia    Rosacea 10/22/2016   Urine incontinence    Past Surgical History:  Procedure Laterality Date    ANTERIOR CERVICAL DECOMP/DISCECTOMY FUSION N/A 11/25/2022   Procedure: ANTERIOR CERVICAL DISCECTOMY AND FUSION CERVICAL FIVE TO SEVEN;  Surgeon: Burnetta Aures, MD;  Location: [facility] OR;  Service: Orthopedics;  Laterality: N/A;   AUGMENTATION MAMMAPLASTY Bilateral 2002   BLADDER SUSPENSION  1981   CATARACT EXTRACTION W/ INTRAOCULAR LENS IMPLANT Bilateral    COLONOSCOPY     CORONARY PRESSURE/FFR STUDY N/A 11/04/2017   Procedure: INTRAVASCULAR PRESSURE WIRE/FFR STUDY;  Surgeon: Mady Bruckner, MD;  Location: [facility] INVASIVE CV [facility];  Service: Cardiovascular;  Laterality: N/A;   CORONARY STENT INTERVENTION N/A 11/04/2017   Procedure: CORONARY STENT INTERVENTION;  Surgeon: Mady Bruckner, MD;  Location: [facility]AS[facility];  Service: Cardiovascular;  Laterality: N/A;   CORONARY ULTRASOUND/IVUS N/A 11/04/2017   Procedure: Intravascular Ultrasound/IVUS;  Surgeon: Mady Bruckner, MD;  Location: [facility];  Service: Cardiovascular;  Laterality: N/A;   ENDOVENOUS ABLATION SAPHENOUS VEIN W/ LASER Left 12/29/2019   endovenous laser ablation left greater saphenous vein by Carlin Haddock MD    ESOPHAGOGASTRODUODENOSCOPY     KNEE ARTHROSCOPY Right 1998, [id number]   LAPAROSCOPIC CHOLECYSTECTOMY  2007   LEFT HEART CATH AND CORONARY ANGIOGRAPHY N/A 11/04/2017   Procedure: LEFT HEART CATH AND CORONARY ANGIOGRAPHY;  Surgeon: Mady Bruckner, MD;  Location: [facility];  Service: Cardiovascular;  Laterality: N/A;   LEFT HEART CATHETERIZATION WITH CORONARY ANGIOGRAM N/A 11/21/2011   Procedure:  LEFT HEART CATHETERIZATION WITH CORONARY ANGIOGRAM;  Surgeon: Maude JAYSON Emmer, MD;  Location: [facility] CATH LAB;  Service: Cardiovascular;  Laterality: N/A;   TONSILLECTOMY  1975   VAGINAL HYSTERECTOMY  1983   still have my ovaries   Patient Active Problem List   Diagnosis Date Noted   Persistent vertigo-unknown origin 01/01/2024   Gait instability 01/01/2024   Low calcium  levels 01/01/2024   White matter  disease, unspecified-mild periventricular and deep cerebral 01/01/2024   Hypomagnesemia 11/26/2022   S/P cervical spinal fusion 11/25/2022   Hx of adenomatous colonic polyps 12/10/2021   Aortic atherosclerosis (HCC) 10/30/2021   Thoracic radiculopathy 10/26/2021   Paresthesia 10/26/2021   Right-sided thoracic back pain 10/26/2021   Neurosensory deficit 10/26/2021   Thoracic degenerative disc disease 10/11/2021   Dextroscoliosis of thoracic spine 10/11/2021   PAF (paroxysmal atrial fibrillation) (HCC) 07/30/2021   Hyperlipidemia associated with type 2 diabetes mellitus (HCC) 01/01/2021   Acquired thrombophilia (HCC) 09/07/2020   Peripheral vascular disease (HCC) 09/01/2020   Statin not tolerated 09/01/2020   Unstable angina (HCC) 08/26/2020   Varicose veins of both lower extremities with pain 06/15/2019   Iron  deficiency anemia 08/07/2018   Iron  malabsorption 08/07/2018   CAD S/P percutaneous coronary angioplasty 11/12/2017   Abnormal cardiac CT angiography 11/04/2017   Diabetic gastroparesis (HCC) 07/19/2014   Esophageal reflux 08/25/2013   Essential hypertension 08/24/2010   Hyperlipidemia LDL goal <70 05/04/2007   Hypothyroidism 04/27/2007   Hepatic steatosis 04/27/2007    PCP: Dr. Charlies Bellini  REFERRING PROVIDER: Dr. Charlies Bellini  REFERRING DIAG: H81.4 (ICD-10-CM) - Persistent vertigo of central origin  THERAPY DIAG:  Dizziness and giddiness  Difficulty in walking, not elsewhere classified  Muscle weakness (generalized)  ONSET DATE: 3 weeks ago  Rationale for Evaluation and Treatment: Rehabilitation  SUBJECTIVE:   SUBJECTIVE STATEMENT: 68 y/o female referred to PT for vertigo.   She began having severe vertigo severals week ago for no apparent reason. She went to the ED, was given Meclizine , Ativan , and benadryl  and did better.  She was initially so symptomatic that she could not even ambulate.   She is ambulating now with no device, but is quite unsteady and  unsafe doing so.  She reports dizziness throughout the day, but worst at night when she rolls over in bed and gets up to go to the bathroom.   She has had an MRI that showed mild white matter disease.  Reports has had short periods of mild vertigo over the yeas that have resolved on their own.  However, this was by far the worst she has ever experienced and was initially completely disabling.   Since onset she is 75% better during the day and  gets better as the day goes on.   At night she is still having a very difficult time and states her symptoms are only 25% improved for nighttime hours. During the day Quick head movements worsen as well when she is sitting.   She denies any falls but thinks she may have a stress fx in her R foot lateral aspect.  States has had them before, but she hasn't been to get it xrayed.  Pt accompanied by: self  PERTINENT HISTORY: chronic neck and shoulder pain, cervical fusion, asthma, AFIB, angina, HTN, Diabetes  PAIN:  Are you having pain? Yes: NPRS scale: 4/10 sitting; 8/10 worst as day progresses Pain location: R lateral ankle/foot Pain description: aching, sharper as day goes on Aggravating factors: prolonged walking Relieving factors: not  walking  PRECAUTIONS: None  RED FLAGS: None   WEIGHT BEARING RESTRICTIONS: No  FALLS: Has patient fallen in last 6 months? No  LIVING ENVIRONMENT: Lives with: lives with their family and lives with their spouse Lives in: House/apartment Stairs: Yes: External: 3 steps; can reach both Has following equipment at home: Single point cane  PLOF: Independent with gait;  retired from office mgmt work  PATIENT GOALS: not be dizzy, have better balance, be able to get OOB at night normally  OBJECTIVE:  Note: Objective measures were completed at Evaluation unless otherwise noted.  DIAGNOSTIC FINDINGS:   MRI HEAD WITHOUT CONTRAST   TECHNIQUE: Multiplanar, multiecho pulse sequences of the brain and  surrounding structures were obtained without intravenous contrast.   COMPARISON:  CT the head dated December 24, 2023.   FINDINGS: Brain: There is no restricted diffusion to indicate acute or recent infarction. There is mild periventricular and deep cerebral white matter disease. There is no evidence of hemorrhage, mass, cortical infarct or hydrocephalus.   Vascular: There are normal vascular flow voids present.   Skull and upper cervical spine: Normal marrow signal. No osseous lesions.   Sinuses/Orbits: Clear paranasal sinuses. Status post bilateral lens replacement.   Other: None.   IMPRESSION: 1. Mild periventricular and deep cerebral white matter disease. No apparent acute process.     Electronically Signed   By: Evalene Coho M.D.   On: 12/24/2023 15:46  COGNITION: Overall cognitive status: Within functional limits for tasks assessed   SENSATION: WFL  EDEMA:  None noted  MUSCLE TONE:  WNL BUE and BLE  DTRs:  NT  POSTURE:  rounded shoulders and forward head  Cervical ROM:    Active A/PROM (deg) eval  Flexion 80%; p!  Extension 60%; some p!  Right lateral flexion 45%  Left lateral flexion 45%  Right rotation 50%  Left rotation 50%  (Blank rows = not tested)  STRENGTH:   UPPER EXTREMITY MMT:  MMT Right eval Left eval  Shoulder flexion 4 4  Shoulder extension    Shoulder abduction 4 4  Shoulder adduction    Shoulder extension    Shoulder internal rotation    Shoulder external rotation 4 4  Middle trapezius    Lower trapezius    Elbow flexion 5 5  Elbow extension 5 5  Wrist flexion    Wrist extension    Wrist ulnar deviation    Wrist radial deviation    Wrist pronation    Wrist supination    Grip strength 5 5   (Blank rows = not tested)   LOWER EXTREMITY MMT:   MMT Right eval Left eval  Hip flexion    Hip abduction    Hip adduction    Hip internal rotation    Hip external rotation    Knee flexion    Knee extension     Ankle dorsiflexion    Ankle plantarflexion    Ankle inversion    Ankle eversion    (Blank rows = not tested)  BED MOBILITY:  Sit to supine independent Supine to sit independent Rolling to Right independent Rolling to Left independent, but has vertigo symptoms  TRANSFERS: Assistive device utilized: None  Sit to stand: CGA Stand to sit: Complete Independence and CGA Chair to chair: NT Floor: nt  GAIT: Gait pattern: Very slow, guarded, tentative gait with short steps, absolutely head movement or trunk rotation, keeps eyes fixed ahead Distance walked: 200 ft into clinic Assistive device utilized: None Level of  assistance: CGA Comments: Gait is unsteady and unsafe without a device and she requires CGA from PT today to maintain her balance in clinic.  She is advised to use at least a cane for now.  If she turns or changes direction she loses balance  FUNCTIONAL TESTS:  TBD  PATIENT SURVEYS:  DHI = 75  VESTIBULAR ASSESSMENT:  GENERAL OBSERVATION: little head or eye movement   SYMPTOM BEHAVIOR:  Subjective history: (see initial note above)  Non-Vestibular symptoms: changes in vision, diplopia, neck pain, and nausea/vomiting  Type of dizziness: Imbalance (Disequilibrium) and feels like falling  Frequency: depends on how much she turns her head  Duration: 1-2 min  Aggravating factors: Induced by position change: lying supine, rolling to the right, rolling to the left, and supine to sit and Induced by motion: bending down to the ground, turning body quickly, and turning head quickly  Relieving factors: avoiding position changes or head movement  Progression of symptoms: better  OCULOMOTOR EXAM:  Ocular Alignment: normal  Ocular ROM: No Limitations  Spontaneous Nystagmus: absent  Gaze-Induced Nystagmus: left beating with right gaze  Smooth Pursuits: intact  Saccades: intact  Convergence/Divergence: appears normal, but not measured.  Eyes converge and diverge appropriately  with testing  FRENZEL - FIXATION SUPRESSED:  Ocular Alignment: normal  Spontaneous Nystagmus: absent  Gaze-Induced Nystagmus: nystagmus is present with gaze induced testing, but difficult to tell direction (very faint and variable)  Horizontal head shaking - induced nystagmus: negative  Vertical head shaking - induced nystagmus: negative  Positional tests: Right Dix-Hallpike: no nystagmus Left Dix-Hallpike: no nystagmus  Pressure tests:   VESTIBULAR - OCULAR REFLEX:   Slow VOR: Comment: impaired with sitting finger following  VOR Cancellation: Comment: NT  Head-Impulse Test: NT  Dynamic Visual Acuity: NT   POSITIONAL TESTING: Right Dix-Hallpike: no nystagmus Left Dix-Hallpike: no nystagmus; but symptomatic when returning to sit from both direction  MOTION SENSITIVITY:  Motion Sensitivity Quotient Intensity: 0 = none, 1 = Lightheaded, 2 = Mild, 3 = Moderate, 4 = Severe, 5 = Vomiting  Intensity  1. Sitting to supine 2  2. Supine to L side 1  3. Supine to R side 3  4. Supine to sitting 4  5. L Hallpike-Dix 1  6. Up from L  1  7. R Hallpike-Dix 1  8. Up from R  4  9. Sitting, head tipped to L knee 0  10. Head up from L knee 0  11. Sitting, head tipped to R knee 0  12. Head up from R knee 0  13. Sitting head turns x5 2  14.Sitting head nods x5 3  15. In stance, 180 turn to L  2  16. In stance, 180 turn to R 2     Total = 26   OTHOSTATICS: not done  FUNCTIONAL GAIT: TBD  TREATMENT DATE:    Canalith Repositioning:  Comment: not done due to no symptoms or nystagmus until returning to sit Gaze Adaptation:  x1 Viewing Horizontal: Comment: HEP  Habituation:  Brandt-Daroff: comment: 10 each direction  SELF CARE: Provided education on PT POC progression, to promote safe home environment, and on avoiding quick/sudden head movements.   PATIENT  EDUCATION: Education details: initial HEP, home safety, using bed grab bar for safety when getting OOB;  using a cane or walker PRN to reduce falls risk in the home and also out of the home Person educated: Patient Education method: Explanation, Demonstration, Tactile cues, Verbal cues, and Handouts Education comprehension: verbalized understanding, verbal cues required, tactile cues required, and needs further education  HOME EXERCISE PROGRAM: Access Code: [email or web address] URL: [email or web address] Date: 01/18/2024 Prepared by: Garnette Montclair  Exercises - Brandt-Daroff Vestibular Exercise  - 1 x daily - 7 x weekly - 3 sets - 10 reps - Tandem Stance  - 1 x daily - 7 x weekly - 3 sets - 10 reps - Seated Gaze Stabilization with Head Rotation  - 1 x daily - 7 x weekly - 3 sets - 10 reps - Seated Gaze Stabilization with Head Nod  - 1 x daily - 7 x weekly - 3 sets - 10 reps   GOALS: Goals reviewed with patient? Yes  SHORT TERM GOALS: Target date: 02/13/2024  Patient will report 25-50% subjective improvement in symptoms of dizziness Baseline: Motion sensitivity quotient is 26 Goal status: INITIAL  2.  Patient will be independent with initial HEP Baseline: 100% PT assist required for correct completion Goal status: INITIAL  LONG TERM GOALS: Target date: 03/12/24  Patient will improve on DHI to < 40 to indicate decreased subjective symptoms of dizziness, better dizziness control, and improved QOL Baseline: 75/100 Goal status: INITIAL  2.  Patient will ambulate with no device indoors, outdoors, and on steps with good balance independently for return to community Baseline:  Reqires CGA to min A to ambulate in clinic Goal status: INITIAL  3.  Patient will demonstrate DGI score improvement to > 20/24 to decrease falls risk Baseline: TBD Goal status: INITIAL  4.  Patient will be able to ambulate down a corridor with head turns and direction changes with minimal to no c/o  dizziness Baseline:  has symptoms with seated head turns and nods Goal status: INITIAL ASSESSMENT:  CLINICAL IMPRESSION: Patient is a 68 y.o.  who was seen today for physical therapy evaluation and treatment for vertigo of central origin.   Patient reports symptoms started a couple of weeks ago for no apparent reason and were severely disabling to the point she could not ambulate.   She was started on Meclizine  and is doing better.  Her brain MRI did not show any CVA or other acute abnormalities.  She is still having symptoms when she rolls in bed or gets OOB at night.  Night is her worst time.   Hallpike-Dix testing today did not cause any symptoms or nystagmus.   However, when she rolls to the R or goes from supine to sit she becomes very dizzy.   She does have some nystagmus present with gaze stabilization testing and has dizziness with head movements of nodding worse than turning.  She may have a cupulolithiasis along with some central origin issues, and is given Brandt-Darhoff habituation exercises initially to see if otoconia might dislodge.   She is moderately unsteady with ambulation and needs an assistive device for safety at this  time.  Without a device she is a high fall risk presently.   Yosselyn, has deficits with dizziness, impaired cervical ROM, balance, and safety.   PT is necessary to address each one of these deficits and she is agreeable to the POC  OBJECTIVE IMPAIRMENTS: Abnormal gait, decreased activity tolerance, decreased balance, difficulty walking, and dizziness.   ACTIVITY LIMITATIONS: lifting, bending, sitting, squatting, sleeping, and locomotion level  PARTICIPATION LIMITATIONS: meal prep, cleaning, laundry, driving, shopping, community activity, and yard work  PERSONAL FACTORS: Age and 1-2 comorbidities: chronic neck and shoulder pain, cervical fusion, asthma, AFIB, angina, HTN, Diabetes are also affecting patient's functional outcome.   REHAB POTENTIAL: Good  CLINICAL  DECISION MAKING: Evolving/moderate complexity  EVALUATION COMPLEXITY: Moderate   PLAN:  PT FREQUENCY: 1-2x/week  PT DURATION: 8 weeks  PLANNED INTERVENTIONS: [id number]- PT Re-evaluation, [id number]- Physical Performance Testing, 97110-Therapeutic exercises, 97530- Therapeutic activity, 97112- Neuromuscular re-education, 97535- Self Care, [id number]- Manual therapy, [phone]- Gait training, [phone]- Canalith repositioning, Patient/Family education, Balance training, Stair training, and Spinal mobilization  PLAN FOR NEXT SESSION: Recheck Hallpike; Assess how brandt-darhoff exercises are impacting vertigo and probably continue with them x 1 more week if her [place] testing is still negative.   Jaylon Boylen, PT 01/18/2024, 2:57 PM

## 2024-01-14 NOTE — Progress Notes (Signed)
 @Patient  ID: [name] [name], female    DOB: [birth year], 68 y.o.   MRN: [id number]  Chief Complaint  Patient presents with   Consult    Referring provider: Catherine Charlies LABOR, DO  HPI: 68 yo female seen for sleep consult 01/14/2024 for restless sleep and daytime sleepiness.  Medical history signifcant for CAD  TEST/EVENTS :   01/14/2024 Sleep consult  Discussed the use of AI scribe software for clinical note transcription with the patient, who gave verbal consent to proceed.  History of Present Illness [name] [name] is a 68 year old female with atrial fibrillation and diabetes who presents with sleeping issues, restless sleep, and daytime sleepiness.  She experiences restless sleep and daytime sleepiness, waking up two to three times per night to use the bathroom and multiple times to check the time. She goes to bed around 10 PM and wakes up around 7 AM without taking any sleep aids. Daytime sleepiness is more pronounced when she is not engaged in activities, such as watching TV, and she sometimes naps during the day if not busy.   Dizziness began in mid-July, severe enough to require ER visit on July 30th, where she could not sit up due to the spinning sensation. She feels very dizzy when getting up in the middle of the night and has to hold onto things to walk. Dizziness persists during the day, even while sitting. Flonase for allergies did not help with the dizziness. Meclizine  was provided at the hospital, but it causes her to sleep. She has not tried taking it at night before bed. She underwent a CT and MRI during her hospitalization that were negative for acute findings.  Her lab work showed mild anemia, low magnesium , and borderline vitamin D  levels. She has been on magnesium  for years, and her iron  has been chronically low   She has Diabetes, use Dexcom monitor. Her glucose levels drop to about 70 at time around 2-3 AM and then spike over 200 before she gets up. She uses a Dexcom to  monitor her glucose levels. She denies personal history of congestive heart failure or stroke. She reports no snoring and is not aware of any episodes of stopped breathing or gasping during sleep. Epworth score is 12 out 24.      Allergies  Allergen Reactions   Epinephrine  Other (See Comments)    Pass out    Other     Other reaction(s): Myalgia   Statins Other (See Comments)    Myalgias: Simvastatin also caused back ache   Welchol  [Colesevelam  Hcl]     myalgia   Amoxicillin Itching and Rash    Has patient had a PCN reaction causing immediate rash, facial/tongue/throat swelling, SOB or lightheadedness with hypotension: No Has patient had a PCN reaction causing severe rash involving mucus membranes or skin necrosis: No Has patient had a PCN reaction that required hospitalization: No Has patient had a PCN reaction occurring within the last 10 years: Yes If all of the above answers are NO, then may proceed with Cephalosporin use.     Immunization History  Administered Date(s) Administered   Fluad Quad(high Dose 65+) 02/25/2020, 03/13/2021, 04/11/2022   Influenza Split 03/27/2011, 04/08/2012   Influenza Whole 03/14/2009, 02/06/2010   Influenza, Quadrivalent, Recombinant, Inj, Pf 03/13/2021   Influenza,inj,Quad PF,6+ Mos 01/27/2013, 05/02/2014, 04/12/2017, 03/31/2018, 03/15/2019   Influenza-Unspecified 02/17/2023   PFIZER(Purple Top)SARS-COV-2 Vaccination 08/08/2019, 09/01/2019, 03/13/2021   PNEUMOCOCCAL CONJUGATE-20 04/11/2022   Pfizer Covid-19 Vaccine Bivalent Booster 54yrs &  up 03/13/2021   Pneumococcal Polysaccharide-23 03/28/2008   Tdap 09/18/2011   Zoster Recombinant(Shingrix) 04/11/2022, 09/24/2022   Zoster, Live 04/15/2016    Past Medical History:  Diagnosis Date   Accelerating angina (HCC) 11/04/2017   Allergy     Asthma    Atrial fibrillation (HCC) 08/27/2020   Bone spur 10/11/2021   Cataract    Chronic kidney disease    Chronic pain disorder    neck and  shoulder   Chronic pain of right upper extremity 11/10/2017   Coronary artery disease 2019   Stent x2   CTS (carpal tunnel syndrome) 01/27/2013   right   Diabetes mellitus type II    Diabetic gastroparesis (HCC)    Dysrhythmia    A. Fib   Esophageal reflux 08/25/2013   [place], Dr Norvell   FATTY LIVER DISEASE 04/27/2007   Qualifier: Diagnosis of  By: Anice CMA, Darlene     Focal muscle atrophy 09/26/2015   Fundic gland polyps of stomach, benign    Globus sensation 04/13/2018   Hordeolum externum of right upper eyelid 12/24/2021   HTN (hypertension)    Hx of adenomatous colonic polyps 12/10/2021   Hyperlipidemia, mixed 05/04/2007   Qualifier: Diagnosis of  By: Georgian ROSALEA CHARM Lamar crestor  caused myalgias even at low dose Livalo  caused myalgias Lipitor  Mother with severe reaction myalgias Simvastatin, Welchol     Hypokalemia 05/25/2013   Improved stopping HCTZ. Was noted to have an elevated glucose when K was low. Recheck renal next week after starting Maxzide   Hypomagnesemia 08/10/2014   Hypothyroidism    Iron  deficiency anemia 08/07/2018   Iron  malabsorption 08/07/2018   Laryngitis 08/25/2017   Leg cramps 04/15/2016   Nausea without vomiting 08/08/2014   NONSPEC ELEVATION OF LEVELS OF TRANSAMINASE/LDH 05/01/2007   Qualifier: Diagnosis of  By: Eyvonne Jacob     Osteoarthritis    chronic, right knee (11/04/2017)   Plantar fasciitis    Pneumonia    Rosacea 10/22/2016   Urine incontinence     Tobacco History: Social History   Tobacco Use  Smoking Status Never   Passive exposure: Never  Smokeless Tobacco Never  Tobacco Comments   Never smoked 11/12/23   Counseling given: Not Answered Tobacco comments: Never smoked 11/12/23   Outpatient Medications Prior to Visit  Medication Sig Dispense Refill   Calcium  Carbonate (CALTRATE 600 PO) Take 650 mg by mouth daily.     Carboxymeth-Glyc-Polysorb PF (REFRESH OPTIVE MEGA-3) 0.5-1-0.5 % SOLN Place 1 drop into both  eyes 3 (three) times daily.     Cyanocobalamin  (VITAMIN B12) 1000 MCG TBCR 1 tablet Orally Once a day; Duration: 30 day(s)     dabigatran  (PRADAXA ) 150 MG CAPS capsule Take 1 capsule (150 mg total) by mouth 2 (two) times daily. 180 capsule 3   ezetimibe  (ZETIA ) 10 MG tablet Take 10 mg by mouth daily.     fenofibrate  160 MG tablet TAKE 1 TABLET DAILY 90 tablet 3   fexofenadine (ALLEGRA) 180 MG tablet Take 180 mg by mouth daily.     Inclisiran Sodium  (LEQVIO  Pillow) Inject into the skin.     isosorbide  mononitrate (IMDUR ) 30 MG 24 hr tablet Take 1 tablet (30 mg total) by mouth daily. 90 tablet 3   ketoconazole (NIZORAL) 2 % shampoo Apply 1 Application topically 2 (two) times a week.     levothyroxine  (SYNTHROID ) 100 MCG tablet Take 100 mcg by mouth daily before breakfast.     Magnesium  250 MG TABS Take 250  mg by mouth daily.     metFORMIN  (GLUCOPHAGE -XR) 500 MG 24 hr tablet Take 500 mg by mouth 2 (two) times daily.     metoprolol  tartrate (LOPRESSOR ) 50 MG tablet Take 1 tablet (50 mg total) by mouth 2 (two) times daily. 60 tablet 2   nitroGLYCERIN  (NITROSTAT ) 0.4 MG SL tablet Place 1 tablet (0.4 mg total) under the tongue every 5 (five) minutes as needed for chest pain. 25 tablet PRN   NOVOLIN 70/30 KWIKPEN (70-30) 100 UNIT/ML KwikPen Inject 14-25 Units into the skin See admin instructions. Take 24 mg in the morning 14 mg at lunch and 25 mg a bedtime     pantoprazole  (PROTONIX ) 40 MG tablet TAKE 1 TABLET TWICE DAILY  BEFORE MEALS 180 tablet 0   pregabalin  (LYRICA ) 50 MG capsule Take 1 capsule (50 mg total) by mouth 2 (two) times daily. 180 capsule 1   Probiotic Product (PROBIOTIC DAILY PO) Take 1 tablet by mouth daily.     valsartan  (DIOVAN ) 40 MG tablet Take 1 tablet (40 mg total) by mouth daily. 90 tablet 1   No facility-administered medications prior to visit.     Review of Systems:   Constitutional:   No  weight loss, night sweats,  Fevers, chills, +fatigue, or  lassitude.  HEENT:   No  headaches,  Difficulty swallowing,  Tooth/dental problems, or  Sore throat,                No sneezing, itching, ear ache, +nasal congestion, post nasal drip,   CV:  No chest pain,  Orthopnea, PND, swelling in lower extremities, anasarca, dizziness, palpitations, syncope.   GI  No heartburn, indigestion, abdominal pain, nausea, vomiting, diarrhea, change in bowel habits, loss of appetite, bloody stools.   Resp: No shortness of breath with exertion or at rest.  No excess mucus, no productive cough,  No non-productive cough,  No coughing up of blood.  No change in color of mucus.  No wheezing.  No chest wall deformity  Skin: no rash or lesions.  GU: no dysuria, change in color of urine, no urgency or frequency.  No flank pain, no hematuria   MS:  No joint pain or swelling.  No decreased range of motion.  No back pain.    Physical Exam  BP 120/60 (BP Location: Right Arm, Patient Position: Sitting)   Pulse 66   Ht 5' 5.5 (1.664 m)   Wt 183 lb 12.8 oz (83.4 kg)   SpO2 98% Comment: RA  BMI 30.12 kg/m   GEN: A/Ox3; pleasant , NAD, well nourished    HEENT:  [place]/AT,  EACs-clear, TMs-wnl, NOSE-clear, THROAT-clear, no lesions, no postnasal drip or exudate noted.  Class 2-3 MP airway   NECK:  Supple w/ fair ROM; no JVD; normal carotid impulses w/o bruits; no thyromegaly or nodules palpated; no lymphadenopathy.    RESP  Clear  P & A; w/o, wheezes/ rales/ or rhonchi. no accessory muscle use, no dullness to percussion  CARD:  RRR, no m/r/g, no peripheral edema, pulses intact, no cyanosis or clubbing.  GI:   Soft & nt; nml bowel sounds; no organomegaly or masses detected.   Musco: Warm bil, no deformities or joint swelling noted.   Neuro: alert, no focal deficits noted.    Skin: Warm, no lesions or rashes    Lab Results:  CBC    Component Value Date/Time   WBC 5.6 12/24/2023 1053   RBC 4.15 12/24/2023 1053   HGB 11.6 (L)  12/24/2023 1053   HGB 12.0 10/12/2021 1045   HCT 35.3  (L) 12/24/2023 1053   HCT 35.0 10/12/2021 1045   PLT 204 12/24/2023 1053   PLT 233 10/12/2021 1045   MCV 85.1 12/24/2023 1053   MCV 84 10/12/2021 1045   MCH 28.0 12/24/2023 1053   MCHC 32.9 12/24/2023 1053   RDW 13.1 12/24/2023 1053   RDW 12.5 10/12/2021 1045   LYMPHSABS 1.1 12/24/2023 1053   MONOABS 0.4 12/24/2023 1053   EOSABS 0.2 12/24/2023 1053   BASOSABS 0.0 12/24/2023 1053    BMET    Component Value Date/Time   NA 140 01/02/2024 1015   NA 142 01/29/2023 0000   K 4.7 01/02/2024 1015   CL 103 01/02/2024 1015   CO2 28 01/02/2024 1015   GLUCOSE 214 (H) 01/02/2024 1015   BUN 13 01/02/2024 1015   BUN 18 01/29/2023 0000   CREATININE 1.01 01/02/2024 1015   CREATININE 1.12 (H) 09/18/2018 0801   CREATININE 0.72 06/20/2011 0925   CALCIUM  9.4 01/02/2024 1015   CALCIUM  9.1 01/02/2024 1015   GFRNONAA >60 12/24/2023 1053   GFRNONAA 52 (L) 09/18/2018 0801   GFRAA >60 09/18/2018 0801    BNP No results found for: BNP  ProBNP No results found for: PROBNP  Imaging: MR BRAIN WO CONTRAST Result Date: 12/24/2023 CLINICAL DATA:  Stroke, follow up EXAM: MRI HEAD WITHOUT CONTRAST TECHNIQUE: Multiplanar, multiecho pulse sequences of the brain and surrounding structures were obtained without intravenous contrast. COMPARISON:  CT the head dated December 24, 2023. FINDINGS: Brain: There is no restricted diffusion to indicate acute or recent infarction. There is mild periventricular and deep cerebral white matter disease. There is no evidence of hemorrhage, mass, cortical infarct or hydrocephalus. Vascular: There are normal vascular flow voids present. Skull and upper cervical spine: Normal marrow signal. No osseous lesions. Sinuses/Orbits: Clear paranasal sinuses. Status post bilateral lens replacement. Other: None. IMPRESSION: 1. Mild periventricular and deep cerebral white matter disease. No apparent acute process. Electronically Signed   By: Evalene Coho M.D.   On: 12/24/2023 15:46    CT HEAD WO CONTRAST Result Date: 12/24/2023 CLINICAL DATA:  68 year old female with dizziness and nausea. EXAM: CT HEAD WITHOUT CONTRAST TECHNIQUE: Contiguous axial images were obtained from the base of the skull through the vertex without intravenous contrast. RADIATION DOSE REDUCTION: This exam was performed according to the departmental dose-optimization program which includes automated exposure control, adjustment of the mA and/or kV according to patient size and/or use of iterative reconstruction technique. COMPARISON:  Head CT 09/26/2015. FINDINGS: Brain: Cerebral volume remains normal for age. No midline shift, ventriculomegaly, mass effect, evidence of mass lesion, intracranial hemorrhage or evidence of cortically based acute infarction. Gray-white matter differentiation is within normal limits throughout the brain. Vascular: Calcified atherosclerosis at the skull base. No suspicious intracranial vascular hyperdensity. Skull: Intact.  No acute osseous abnormality identified. Sinuses/Orbits: Tympanic cavities com Visualized paranasal sinuses and mastoids are clear. Other: Postoperative changes to both globes. No acute orbit or scalp soft tissue finding. IMPRESSION: Normal for age noncontrast Head CT. Electronically Signed   By: VEAR Hurst M.D.   On: 12/24/2023 12:35    inclisiran (LEQVIO ) injection 284 mg     Date Action Dose Route User   Discharged on 12/24/2023   Admitted on 12/24/2023   12/10/2023 1517 Given 284 mg Subcutaneous (Left Arm) Williams, Devonya R, LPN          Latest Ref Rng & Units 11/17/2017    8:37  AM  PFT Results  FVC-Pre L 2.78   FVC-Predicted Pre % 81   FVC-Post L 2.76   FVC-Predicted Post % 81   Pre FEV1/FVC % % 88   Post FEV1/FCV % % 89   FEV1-Pre L 2.46   FEV1-Predicted Pre % 94   FEV1-Post L 2.46   DLCO uncorrected ml/min/mmHg 15.73   DLCO UNC% % 59   DLCO corrected ml/min/mmHg 17.00   DLCO COR %Predicted % 64   DLVA Predicted % 86   TLC L 4.90   TLC %  Predicted % 92   RV % Predicted % 71     Lab Results  Component Value Date   NITRICOXIDE 28 10/01/2017        Assessment & Plan:   No problem-specific Assessment & Plan notes found for this encounter. Assessment and Plan Assessment & Plan Suspected obstructive sleep apnea with restless sleep and daytime sleepiness   Suspected obstructive sleep apnea with restless sleep and daytime sleepiness, with underlying co-morbidities atrial fibrillation and diabetes. Sleep apnea may worsen these conditions by causing hypoxemia, increasing inflammation, and affecting glycemic control. Although dizziness is uncommon in sleep apnea, It could be related  to hypoxemia.  Order a home sleep study through [facility], provide information on sleep apnea, and follow up in six weeks to discuss results and treatment plan as indicated.   Dizziness and nocturnal hypoglycemia   Dizziness, severe at night, -several possible differential diagnosis - nocturnal hypoglycemia, diabetes, inner ear issues, chronic allergies, or medication side effects. Workup is ongoing. . Recommend vestibular therapy evaluation and exercises, suggest taking half a dose of Meclizine  at bedtime, advise on hydration and avoiding quick movements, and encourage follow-up with an endocrinologist to address nocturnal hypoglycemia. Keep upcoming follow up with Neurology.    Type 2 diabetes mellitus with nocturnal hypoglycemia   Type 2 diabetes with nocturnal hypoglycemia . Glucose levels drop to 70 mg/dL at night,  Advise consultation with an endocrinologist to adjust diabetes management and prevent nocturnal hypoglycemia.  Atrial fibrillation   Atrial fibrillation may be exacerbated by sleep apnea and is currently managed by cardiology. HST is pending.   Coronary artery disease, status post stents   Coronary artery disease, status post stents, with no recent cardiac events reported. Management includes monitoring for potential  exacerbation by sleep apnea.  Hypertension   Hypertension management by cards   Chronic allergic rhinitis   Chronic allergic rhinitis, continue aggressive treatment   Plan  Patient Instructions  Set up for home sleep study  Do not drive if sleepy  Work on healthy weight loss   Follow up with Neurology as planned  Try Meclizine  1/2 tab At bedtime  As needed  dizziness  Push fluids and change positions slowly  Go for PT for vestibular evaluation  Follow up in 6 weeks and As needed          I spent  45  minutes dedicated to the care of this patient on the date of this encounter to include pre-visit review of records, face-to-face time with the patient discussing conditions above, post visit ordering of testing, clinical documentation with the electronic health record, making appropriate referrals as documented, and communicating necessary findings to members of the patients care team.   Madelin Stank, NP 01/14/2024

## 2024-01-14 NOTE — Patient Instructions (Signed)
 Set up for home sleep study  Do not drive if sleepy  Work on healthy weight loss   Follow up with Neurology as planned  Try Meclizine  1/2 tab At bedtime  As needed  dizziness  Push fluids and change positions slowly  Go for PT for vestibular evaluation  Follow up in 6 weeks and As needed

## 2024-01-15 ENCOUNTER — Ambulatory Visit: Attending: Family Medicine | Admitting: Rehabilitation

## 2024-01-15 DIAGNOSIS — M6281 Muscle weakness (generalized): Secondary | ICD-10-CM | POA: Insufficient documentation

## 2024-01-15 DIAGNOSIS — R262 Difficulty in walking, not elsewhere classified: Secondary | ICD-10-CM | POA: Insufficient documentation

## 2024-01-15 DIAGNOSIS — R42 Dizziness and giddiness: Secondary | ICD-10-CM | POA: Insufficient documentation

## 2024-01-15 DIAGNOSIS — H814 Vertigo of central origin: Secondary | ICD-10-CM | POA: Diagnosis not present

## 2024-01-18 ENCOUNTER — Encounter: Payer: Self-pay | Admitting: Rehabilitation

## 2024-01-21 ENCOUNTER — Ambulatory Visit: Admitting: Rehabilitation

## 2024-01-21 DIAGNOSIS — R262 Difficulty in walking, not elsewhere classified: Secondary | ICD-10-CM

## 2024-01-21 DIAGNOSIS — R42 Dizziness and giddiness: Secondary | ICD-10-CM | POA: Diagnosis not present

## 2024-01-21 DIAGNOSIS — M6281 Muscle weakness (generalized): Secondary | ICD-10-CM

## 2024-01-21 NOTE — Therapy (Signed)
 OUTPATIENT PHYSICAL THERAPY VESTIBULAR/  BALANCE TREATMENT     Patient Name: [name] [name] MRN: [id number] DOB:May 04, 1956, 68 y.o., female Today's Date: 01/21/2024  END OF SESSION:  PT End of Session - 01/21/24 0936     Visit Number 2    Date for PT Re-Evaluation 03/11/24    Authorization Type M[employer] & [employer] supplement gelene for [employer])    PT Start Time 0932    PT Stop Time 1018    PT Time Calculation (min) 46 min    Activity Tolerance Patient tolerated treatment well    Behavior During Therapy [facility] for tasks assessed/performed          Past Medical History:  Diagnosis Date   Accelerating angina (HCC) 11/04/2017   Allergy     Asthma    Atrial fibrillation (HCC) 08/27/2020   Bone spur 10/11/2021   Cataract    Chronic kidney disease    Chronic pain disorder    neck and shoulder   Chronic pain of right upper extremity 11/10/2017   Coronary artery disease 2019   Stent x2   CTS (carpal tunnel syndrome) 01/27/2013   right   Diabetes mellitus type II    Diabetic gastroparesis (HCC)    Dysrhythmia    A. Fib   Esophageal reflux 08/25/2013   [place], Dr Norvell   FATTY LIVER DISEASE 04/27/2007   Qualifier: Diagnosis of  By: Anice CMA, Darlene     Focal muscle atrophy 09/26/2015   Fundic gland polyps of stomach, benign    Globus sensation 04/13/2018   Hordeolum externum of right upper eyelid 12/24/2021   HTN (hypertension)    Hx of adenomatous colonic polyps 12/10/2021   Hyperlipidemia, mixed 05/04/2007   Qualifier: Diagnosis of  By: Georgian ROSALEA CHARM Lamar crestor  caused myalgias even at low dose Livalo  caused myalgias Lipitor  Mother with severe reaction myalgias Simvastatin, Welchol     Hypokalemia 05/25/2013   Improved stopping HCTZ. Was noted to have an elevated glucose when K was low. Recheck renal next week after starting Maxzide   Hypomagnesemia 08/10/2014   Hypothyroidism    Iron  deficiency anemia 08/07/2018   Iron  malabsorption 08/07/2018   Laryngitis  08/25/2017   Leg cramps 04/15/2016   Nausea without vomiting 08/08/2014   NONSPEC ELEVATION OF LEVELS OF TRANSAMINASE/LDH 05/01/2007   Qualifier: Diagnosis of  By: Eyvonne Jacob     Osteoarthritis    chronic, right knee (11/04/2017)   Plantar fasciitis    Pneumonia    Rosacea 10/22/2016   Urine incontinence    Past Surgical History:  Procedure Laterality Date   ANTERIOR CERVICAL DECOMP/DISCECTOMY FUSION N/A 11/25/2022   Procedure: ANTERIOR CERVICAL DISCECTOMY AND FUSION CERVICAL FIVE TO SEVEN;  Surgeon: [name] Aures, MD;  Location: [facility] OR;  Service: Orthopedics;  Laterality: N/A;   AUGMENTATION MAMMAPLASTY Bilateral 2002   BLADDER SUSPENSION  1981   CATARACT EXTRACTION W/ INTRAOCULAR LENS IMPLANT Bilateral    COLONOSCOPY     CORONARY PRESSURE/FFR STUDY N/A 11/04/2017   Procedure: INTRAVASCULAR PRESSURE WIRE/FFR STUDY;  Surgeon: Mady Bruckner, MD;  Location: [facility] INVASIVE CV LAB;  Service: Cardiovascular;  Laterality: N/A;   CORONARY STENT INTERVENTION N/A 11/04/2017   Procedure: CORONARY STENT INTERVENTION;  Surgeon: Mady Bruckner, MD;  Location: [facility] INVASIVE CV LAB;  Service: Cardiovascular;  Laterality: N/A;   CORONARY ULTRASOUND/IVUS N/A 11/04/2017   Procedure: Intravascular Ultrasound/IVUS;  Surgeon: Mady Bruckner, MD;  Location: [facility] INVASIVE CV LAB;  Service: Cardiovascular;  Laterality: N/A;   ENDOVENOUS ABLATION SAPHENOUS  VEIN W/ LASER Left 12/29/2019   endovenous laser ablation left greater saphenous vein by Carlin Haddock MD    ESOPHAGOGASTRODUODENOSCOPY     KNEE ARTHROSCOPY Right 1998, [id number]   LAPAROSCOPIC CHOLECYSTECTOMY  2007   LEFT HEART CATH AND CORONARY ANGIOGRAPHY N/A 11/04/2017   Procedure: LEFT HEART CATH AND CORONARY ANGIOGRAPHY;  Surgeon: Mady Bruckner, MD;  Location: [facility] INVASIVE CV LAB;  Service: Cardiovascular;  Laterality: N/A;   LEFT HEART CATHETERIZATION WITH CORONARY ANGIOGRAM N/A 11/21/2011   Procedure: LEFT HEART CATHETERIZATION WITH CORONARY  ANGIOGRAM;  Surgeon: Maude JAYSON Emmer, MD;  Location: [facility] CATH LAB;  Service: Cardiovascular;  Laterality: N/A;   TONSILLECTOMY  1975   VAGINAL HYSTERECTOMY  1983   still have my ovaries   Patient Active Problem List   Diagnosis Date Noted   Persistent vertigo-unknown origin 01/01/2024   Gait instability 01/01/2024   Low calcium  levels 01/01/2024   White matter disease, unspecified-mild periventricular and deep cerebral 01/01/2024   Hypomagnesemia 11/26/2022   S/P cervical spinal fusion 11/25/2022   Hx of adenomatous colonic polyps 12/10/2021   Aortic atherosclerosis (HCC) 10/30/2021   Thoracic radiculopathy 10/26/2021   Paresthesia 10/26/2021   Right-sided thoracic back pain 10/26/2021   Neurosensory deficit 10/26/2021   Thoracic degenerative disc disease 10/11/2021   Dextroscoliosis of thoracic spine 10/11/2021   PAF (paroxysmal atrial fibrillation) (HCC) 07/30/2021   Hyperlipidemia associated with type 2 diabetes mellitus (HCC) 01/01/2021   Acquired thrombophilia (HCC) 09/07/2020   Peripheral vascular disease (HCC) 09/01/2020   Statin not tolerated 09/01/2020   Unstable angina (HCC) 08/26/2020   Varicose veins of both lower extremities with pain 06/15/2019   Iron  deficiency anemia 08/07/2018   Iron  malabsorption 08/07/2018   CAD S/P percutaneous coronary angioplasty 11/12/2017   Abnormal cardiac CT angiography 11/04/2017   Diabetic gastroparesis (HCC) 07/19/2014   Esophageal reflux 08/25/2013   Essential hypertension 08/24/2010   Hyperlipidemia LDL goal <70 05/04/2007   Hypothyroidism 04/27/2007   Hepatic steatosis 04/27/2007    PCP: Dr. Charlies Bellini  REFERRING PROVIDER: Dr. Charlies Bellini  REFERRING DIAG: H81.4 (ICD-10-CM) - Persistent vertigo of central origin  THERAPY DIAG:  Dizziness and giddiness  Difficulty in walking, not elsewhere classified  Muscle weakness (generalized)  ONSET DATE: 3 weeks ago  Rationale for Evaluation and Treatment:  Rehabilitation  SUBJECTIVE:   SUBJECTIVE STATEMENT: Patient reports the tandem stance exercise is killing her knee oa. Rates knee pain 6/10.  States brandt darhoff exercise is killing her neck.  Rates neck pain 4/10.  States has significant R knee OA and torn R hip labrum which make everything hard.  However, she admits her vertigo is a little better at night now.  States takes her 30 min after brandt-darhoff for her dizziness symptoms to subside.    EVAL: 68 y/o female referred to PT for vertigo.   She began having severe vertigo severals week ago for no apparent reason. She went to the ED, was given Meclizine , Ativan , and benadryl  and did better.  She was initially so symptomatic that she could not even ambulate.   She is ambulating now with no device, but is quite unsteady and unsafe doing so.  She reports dizziness throughout the day, but worst at night when she rolls over in bed and gets up to go to the bathroom.   She has had an MRI that showed mild white matter disease.  Reports has had short periods of mild vertigo over the yeas that have resolved on their own.  However,  this was by far the worst she has ever experienced and was initially completely disabling.   Since onset she is 75% better during the day and  gets better as the day goes on.   At night she is still having a very difficult time and states her symptoms are only 25% improved for nighttime hours. During the day Quick head movements worsen as well when she is sitting.   She denies any falls but thinks she may have a stress fx in her R foot lateral aspect.  States has had them before, but she hasn't been to get it xrayed.  Pt accompanied by: self  PERTINENT HISTORY: chronic neck and shoulder pain, cervical fusion, asthma, AFIB, angina, HTN, Diabetes  PAIN:  Are you having pain? Yes: NPRS scale: 4/10 sitting; 8/10 worst as day progresses Pain location: R lateral ankle/foot Pain description: aching, sharper as day goes  on Aggravating factors: prolonged walking Relieving factors: not walking  PRECAUTIONS: None  RED FLAGS: None   WEIGHT BEARING RESTRICTIONS: No  FALLS: Has patient fallen in last 6 months? No  LIVING ENVIRONMENT: Lives with: lives with their family and lives with their spouse Lives in: House/apartment Stairs: Yes: External: 3 steps; can reach both Has following equipment at home: Single point cane  PLOF: Independent with gait;  retired from office mgmt work  PATIENT GOALS: not be dizzy, have better balance, be able to get OOB at night normally  OBJECTIVE:  Note: Objective measures were completed at Evaluation unless otherwise noted.  DIAGNOSTIC FINDINGS:   MRI HEAD WITHOUT CONTRAST   TECHNIQUE: Multiplanar, multiecho pulse sequences of the brain and surrounding structures were obtained without intravenous contrast.   COMPARISON:  CT the head dated December 24, 2023.   FINDINGS: Brain: There is no restricted diffusion to indicate acute or recent infarction. There is mild periventricular and deep cerebral white matter disease. There is no evidence of hemorrhage, mass, cortical infarct or hydrocephalus.   Vascular: There are normal vascular flow voids present.   Skull and upper cervical spine: Normal marrow signal. No osseous lesions.   Sinuses/Orbits: Clear paranasal sinuses. Status post bilateral lens replacement.   Other: None.   IMPRESSION: 1. Mild periventricular and deep cerebral white matter disease. No apparent acute process.     Electronically Signed   By: [name] [name] M.D.   On: 12/24/2023 15:46  COGNITION: Overall cognitive status: Within functional limits for tasks assessed   SENSATION: WFL  EDEMA:  None noted  MUSCLE TONE:  WNL BUE and BLE  DTRs:  NT  POSTURE:  rounded shoulders and forward head  Cervical ROM:    Active A/PROM (deg) eval  Flexion 80%; p!  Extension 60%; some p!  Right lateral flexion 45%  Left lateral  flexion 45%  Right rotation 50%  Left rotation 50%  (Blank rows = not tested)  STRENGTH:   UPPER EXTREMITY MMT:  MMT Right eval Left eval  Shoulder flexion 4 4  Shoulder extension    Shoulder abduction 4 4  Shoulder adduction    Shoulder extension    Shoulder internal rotation    Shoulder external rotation 4 4  Middle trapezius    Lower trapezius    Elbow flexion 5 5  Elbow extension 5 5  Wrist flexion    Wrist extension    Wrist ulnar deviation    Wrist radial deviation    Wrist pronation    Wrist supination    Grip strength 5 5   (  Blank rows = not tested)   LOWER EXTREMITY MMT:   MMT Right eval Left eval  Hip flexion    Hip abduction    Hip adduction    Hip internal rotation    Hip external rotation    Knee flexion    Knee extension    Ankle dorsiflexion    Ankle plantarflexion    Ankle inversion    Ankle eversion    (Blank rows = not tested)  BED MOBILITY:  Sit to supine independent Supine to sit independent Rolling to Right independent Rolling to Left independent, but has vertigo symptoms  TRANSFERS: Assistive device utilized: None  Sit to stand: CGA Stand to sit: Complete Independence and CGA Chair to chair: NT Floor: nt  GAIT: Gait pattern: Very slow, guarded, tentative gait with short steps, absolutely head movement or trunk rotation, keeps eyes fixed ahead Distance walked: 200 ft into clinic Assistive device utilized: None Level of assistance: CGA Comments: Gait is unsteady and unsafe without a device and she requires CGA from PT today to maintain her balance in clinic.  She is advised to use at least a cane for now.  If she turns or changes direction she loses balance  FUNCTIONAL TESTS:  TBD  PATIENT SURVEYS:  DHI = 75  VESTIBULAR ASSESSMENT:  GENERAL OBSERVATION: little head or eye movement   SYMPTOM BEHAVIOR:  Subjective history: (see initial note above)  Non-Vestibular symptoms: changes in vision, diplopia, neck pain, and  nausea/vomiting  Type of dizziness: Imbalance (Disequilibrium) and feels like falling  Frequency: depends on how much she turns her head  Duration: 1-2 min  Aggravating factors: Induced by position change: lying supine, rolling to the right, rolling to the left, and supine to sit and Induced by motion: bending down to the ground, turning body quickly, and turning head quickly  Relieving factors: avoiding position changes or head movement  Progression of symptoms: better  OCULOMOTOR EXAM:  Ocular Alignment: normal  Ocular ROM: No Limitations  Spontaneous Nystagmus: absent  Gaze-Induced Nystagmus: left beating with right gaze  Smooth Pursuits: intact  Saccades: intact  Convergence/Divergence: appears normal, but not measured.  Eyes converge and diverge appropriately with testing  FRENZEL - FIXATION SUPRESSED:  Ocular Alignment: normal  Spontaneous Nystagmus: absent  Gaze-Induced Nystagmus: nystagmus is present with gaze induced testing, but difficult to tell direction (very faint and variable)  Horizontal head shaking - induced nystagmus: negative  Vertical head shaking - induced nystagmus: negative  Positional tests: Right Dix-Hallpike: no nystagmus Left Dix-Hallpike: no nystagmus  Pressure tests:   VESTIBULAR - OCULAR REFLEX:   Slow VOR: Comment: impaired with sitting finger following  VOR Cancellation: Comment: NT  Head-Impulse Test: NT  Dynamic Visual Acuity: NT   POSITIONAL TESTING: Right Dix-Hallpike: no nystagmus Left Dix-Hallpike: no nystagmus; but symptomatic when returning to sit from both direction  MOTION SENSITIVITY:  Motion Sensitivity Quotient Intensity: 0 = none, 1 = Lightheaded, 2 = Mild, 3 = Moderate, 4 = Severe, 5 = Vomiting  Intensity  1. Sitting to supine 2  2. Supine to L side 1  3. Supine to R side 3  4. Supine to sitting 4  5. L Hallpike-Dix 1  6. Up from L  1  7. R Hallpike-Dix 1  8. Up from R  4  9. Sitting, head tipped to L knee 0  10. Head up  from L knee 0  11. Sitting, head tipped to R knee 0  12. Head up from R knee  0  13. Sitting head turns x5 2  14.Sitting head nods x5 3  15. In stance, 180 turn to L  2  16. In stance, 180 turn to R 2     Total = 26   OTHOSTATICS: not done  FUNCTIONAL GAIT: TBD                                                                                                                             TREATMENT DATE:  01/21/24 THERAPEUTIC EXERCISE: To improve strength.  Demonstration, verbal and tactile cues throughout for technique. NuStep L2 x 6'  NEUROMUSCULAR RE-EDUCATION: To improve balance and vestibular retraining. Corner stagger stance with gaze stabilization x 1' head turns x 10; head nods x 10 Corner feet together EC head turns x 10; head nods x 10 SLS w/ contralateral toe touch for balance x 1' BLE Step stance looking R, then looking L x 1' BLE Hallpike dix recheck bilaterally produces no symptoms or nystagmus;  very dizzy on sitting up though CRT x 1 for L ear to see if it would help.  8/ Canalith Repositioning:  Comment: not done due to no symptoms or nystagmus until returning to sit Gaze Adaptation:  x1 Viewing Horizontal: Comment: HEP  Habituation:  Brandt-Daroff: comment: 10 each direction  SELF CARE: Provided education on PT POC progression, to promote safe home environment, and on avoiding quick/sudden head movements.   PATIENT EDUCATION: Education details: HEP review, HEP updated Person educated: Patient Education method: Explanation, Demonstration, Tactile cues, Verbal cues, and Handouts Education comprehension: verbalized understanding, verbal cues required, tactile cues required, and needs further education  HOME EXERCISE PROGRAM: Access Code: [email or web address] URL: [email or web address] Date: 01/18/2024 Prepared by: Garnette Montclair  Exercises - Brandt-Daroff Vestibular Exercise  - 1 x daily - 7 x weekly - 3 sets - 10 reps - Tandem Stance  - 1 x daily -  7 x weekly - 3 sets - 10 reps - Seated Gaze Stabilization with Head Rotation  - 1 x daily - 7 x weekly - 3 sets - 10 reps - Seated Gaze Stabilization with Head Nod  - 1 x daily - 7 x weekly - 3 sets - 10 reps   GOALS: Goals reviewed with patient? Yes  SHORT TERM GOALS: Target date: 02/13/2024  Patient will report 25-50% subjective improvement in symptoms of dizziness Baseline: Motion sensitivity quotient is 26 Goal status: INITIAL  2.  Patient will be independent with initial HEP Baseline: 100% PT assist required for correct completion Goal status: INITIAL  LONG TERM GOALS: Target date: 03/12/24  Patient will improve on DHI to < 40 to indicate decreased subjective symptoms of dizziness, better dizziness control, and improved QOL Baseline: 75/100 Goal status: INITIAL  2.  Patient will ambulate with no device indoors, outdoors, and on steps with good balance independently for return to community Baseline:  Reqires CGA to min A to ambulate in clinic Goal status: INITIAL  3.  Patient  will demonstrate DGI score improvement to > 20/24 to decrease falls risk Baseline: TBD Goal status: INITIAL  4.  Patient will be able to ambulate down a corridor with head turns and direction changes with minimal to no c/o dizziness Baseline:  has symptoms with seated head turns and nods Goal status: INITIAL ASSESSMENT:  CLINICAL IMPRESSION: Patient does not like doing Brandt-Darhoff exercises, due to increased neck pain and also takes 30 min for her symptoms to resolve afterwards.  However,  she admits that her dizziness is improved .  She has negative [employer] testing, but still very dizzy when transferring supine to sit from the hallpike position.  Therefore I think Brandt-Darhoff is still appropriate for her to continue doing.  We did one round of canalith repositioning treatment today to see if there would be any improvement in symptoms.  Her HEP is updated.  Advised to d/c tandem stance at home  and do stagger stance instead due to knee OA.  PT remains necessary for dizziness, unsteady gait, gait abnormality, hip weakness.   Continue per POC   EVAL: Patient is a 68 y.o.  who was seen today for physical therapy evaluation and treatment for vertigo of central origin.   Patient reports symptoms started a couple of weeks ago for no apparent reason and were severely disabling to the point she could not ambulate.   She was started on Meclizine  and is doing better.  Her brain MRI did not show any CVA or other acute abnormalities.  She is still having symptoms when she rolls in bed or gets OOB at night.  Night is her worst time.   Hallpike-Dix testing today did not cause any symptoms or nystagmus.   However, when she rolls to the R or goes from supine to sit she becomes very dizzy.   She does have some nystagmus present with gaze stabilization testing and has dizziness with head movements of nodding worse than turning.  She may have a cupulolithiasis along with some central origin issues, and is given Brandt-Darhoff habituation exercises initially to see if otoconia might dislodge.   She is moderately unsteady with ambulation and needs an assistive device for safety at this time.  Without a device she is a high fall risk presently.   [place], has deficits with dizziness, impaired cervical ROM, balance, and safety.   PT is necessary to address each one of these deficits and she is agreeable to the POC  OBJECTIVE IMPAIRMENTS: Abnormal gait, decreased activity tolerance, decreased balance, difficulty walking, and dizziness.   ACTIVITY LIMITATIONS: lifting, bending, sitting, squatting, sleeping, and locomotion level  PARTICIPATION LIMITATIONS: meal prep, cleaning, laundry, driving, shopping, community activity, and yard work  PERSONAL FACTORS: Age and 1-2 comorbidities: chronic neck and shoulder pain, cervical fusion, asthma, AFIB, angina, HTN, Diabetes are also affecting patient's functional outcome.   REHAB  POTENTIAL: Good  CLINICAL DECISION MAKING: Evolving/moderate complexity  EVALUATION COMPLEXITY: Moderate   PLAN:  PT FREQUENCY: 1-2x/week  PT DURATION: 8 weeks  PLANNED INTERVENTIONS: 97164- PT Re-evaluation, 97750- Physical Performance Testing, 97110-Therapeutic exercises, 97530- Therapeutic activity, 97112- Neuromuscular re-education, 97535- Self Care, [id number]- Manual therapy, [phone]- Gait training, [phone]- Canalith repositioning, Patient/Family education, Balance training, Stair training, and Spinal mobilization  PLAN FOR NEXT SESSION: Try Brandt-Darhoff maneuvers in clinic for review; progress balance with vestibular challenges;  recheck Hallpike dix   Naima Veldhuizen, PT 01/21/2024, 2:21 PM

## 2024-01-26 ENCOUNTER — Ambulatory Visit

## 2024-01-26 DIAGNOSIS — G4719 Other hypersomnia: Secondary | ICD-10-CM

## 2024-01-27 ENCOUNTER — Ambulatory Visit: Attending: Family Medicine

## 2024-01-27 ENCOUNTER — Other Ambulatory Visit: Payer: Self-pay

## 2024-01-27 DIAGNOSIS — M6281 Muscle weakness (generalized): Secondary | ICD-10-CM | POA: Insufficient documentation

## 2024-01-27 DIAGNOSIS — M542 Cervicalgia: Secondary | ICD-10-CM | POA: Diagnosis present

## 2024-01-27 DIAGNOSIS — R262 Difficulty in walking, not elsewhere classified: Secondary | ICD-10-CM | POA: Insufficient documentation

## 2024-01-27 DIAGNOSIS — R42 Dizziness and giddiness: Secondary | ICD-10-CM | POA: Diagnosis present

## 2024-01-27 NOTE — Therapy (Unsigned)
 OUTPATIENT PHYSICAL THERAPY VESTIBULAR/  BALANCE TREATMENT     Patient Name: [name] [name] MRN: [id number] DOB:1955-06-15, 68 y.o., female Today's Date: 01/28/2024  END OF SESSION:  PT End of Session - 01/27/24 0926     Visit Number 3    Date for PT Re-Evaluation 03/11/24    Authorization Type MCR & [employer] supplement gelene for [employer])    PT Start Time 0930    PT Stop Time 1015    PT Time Calculation (min) 45 min    Activity Tolerance Patient tolerated treatment well    Behavior During Therapy [facility] for tasks assessed/performed           Past Medical History:  Diagnosis Date   Accelerating angina (HCC) 11/04/2017   Allergy     Asthma    Atrial fibrillation (HCC) 08/27/2020   Bone spur 10/11/2021   Cataract    Chronic kidney disease    Chronic pain disorder    neck and shoulder   Chronic pain of right upper extremity 11/10/2017   Coronary artery disease 2019   Stent x2   CTS (carpal tunnel syndrome) 01/27/2013   right   Diabetes mellitus type II    Diabetic gastroparesis (HCC)    Dysrhythmia    A. Fib   Esophageal reflux 08/25/2013   [place], Dr Norvell   FATTY LIVER DISEASE 04/27/2007   Qualifier: Diagnosis of  By: Anice CMA, Darlene     Focal muscle atrophy 09/26/2015   Fundic gland polyps of stomach, benign    Globus sensation 04/13/2018   Hordeolum externum of right upper eyelid 12/24/2021   HTN (hypertension)    Hx of adenomatous colonic polyps 12/10/2021   Hyperlipidemia, mixed 05/04/2007   Qualifier: Diagnosis of  By: Georgian ROSALEA CHARM Lamar crestor  caused myalgias even at low dose Livalo  caused myalgias Lipitor  Mother with severe reaction myalgias Simvastatin, Welchol     Hypokalemia 05/25/2013   Improved stopping HCTZ. Was noted to have an elevated glucose when K was low. Recheck renal next week after starting Maxzide   Hypomagnesemia 08/10/2014   Hypothyroidism    Iron  deficiency anemia 08/07/2018   Iron  malabsorption 08/07/2018    Laryngitis 08/25/2017   Leg cramps 04/15/2016   Nausea without vomiting 08/08/2014   NONSPEC ELEVATION OF LEVELS OF TRANSAMINASE/LDH 05/01/2007   Qualifier: Diagnosis of  By: Eyvonne Jacob     Osteoarthritis    chronic, right knee (11/04/2017)   Plantar fasciitis    Pneumonia    Rosacea 10/22/2016   Urine incontinence    Past Surgical History:  Procedure Laterality Date   ANTERIOR CERVICAL DECOMP/DISCECTOMY FUSION N/A 11/25/2022   Procedure: ANTERIOR CERVICAL DISCECTOMY AND FUSION CERVICAL FIVE TO SEVEN;  Surgeon: Burnetta Aures, MD;  Location: [facility] OR;  Service: Orthopedics;  Laterality: N/A;   AUGMENTATION MAMMAPLASTY Bilateral 2002   BLADDER SUSPENSION  1981   CATARACT EXTRACTION W/ INTRAOCULAR LENS IMPLANT Bilateral    COLONOSCOPY     CORONARY PRESSURE/FFR STUDY N/A 11/04/2017   Procedure: INTRAVASCULAR PRESSURE WIRE/FFR STUDY;  Surgeon: Mady Bruckner, MD;  Location: [facility] INVASIVE CV LAB;  Service: Cardiovascular;  Laterality: N/A;   CORONARY STENT INTERVENTION N/A 11/04/2017   Procedure: CORONARY STENT INTERVENTION;  Surgeon: Mady Bruckner, MD;  Location: [facility] INVASIVE CV LAB;  Service: Cardiovascular;  Laterality: N/A;   CORONARY ULTRASOUND/IVUS N/A 11/04/2017   Procedure: Intravascular Ultrasound/IVUS;  Surgeon: Mady Bruckner, MD;  Location: [facility] INVASIVE CV LAB;  Service: Cardiovascular;  Laterality: N/A;   ENDOVENOUS ABLATION  SAPHENOUS VEIN W/ LASER Left 12/29/2019   endovenous laser ablation left greater saphenous vein by Carlin Haddock MD    ESOPHAGOGASTRODUODENOSCOPY     KNEE ARTHROSCOPY Right 1998, [id number]   LAPAROSCOPIC CHOLECYSTECTOMY  2007   LEFT HEART CATH AND CORONARY ANGIOGRAPHY N/A 11/04/2017   Procedure: LEFT HEART CATH AND CORONARY ANGIOGRAPHY;  Surgeon: Mady Bruckner, MD;  Location: [facility] INVASIVE CV LAB;  Service: Cardiovascular;  Laterality: N/A;   LEFT HEART CATHETERIZATION WITH CORONARY ANGIOGRAM N/A 11/21/2011   Procedure: LEFT HEART CATHETERIZATION WITH  CORONARY ANGIOGRAM;  Surgeon: Maude JAYSON Emmer, MD;  Location: [facility] CATH LAB;  Service: Cardiovascular;  Laterality: N/A;   TONSILLECTOMY  1975   VAGINAL HYSTERECTOMY  1983   still have my ovaries   Patient Active Problem List   Diagnosis Date Noted   Persistent vertigo-unknown origin 01/01/2024   Gait instability 01/01/2024   Low calcium  levels 01/01/2024   White matter disease, unspecified-mild periventricular and deep cerebral 01/01/2024   Hypomagnesemia 11/26/2022   S/P cervical spinal fusion 11/25/2022   Hx of adenomatous colonic polyps 12/10/2021   Aortic atherosclerosis (HCC) 10/30/2021   Thoracic radiculopathy 10/26/2021   Paresthesia 10/26/2021   Right-sided thoracic back pain 10/26/2021   Neurosensory deficit 10/26/2021   Thoracic degenerative disc disease 10/11/2021   Dextroscoliosis of thoracic spine 10/11/2021   PAF (paroxysmal atrial fibrillation) (HCC) 07/30/2021   Hyperlipidemia associated with type 2 diabetes mellitus (HCC) 01/01/2021   Acquired thrombophilia (HCC) 09/07/2020   Peripheral vascular disease (HCC) 09/01/2020   Statin not tolerated 09/01/2020   Unstable angina (HCC) 08/26/2020   Varicose veins of both lower extremities with pain 06/15/2019   Iron  deficiency anemia 08/07/2018   Iron  malabsorption 08/07/2018   CAD S/P percutaneous coronary angioplasty 11/12/2017   Abnormal cardiac CT angiography 11/04/2017   Diabetic gastroparesis (HCC) 07/19/2014   Esophageal reflux 08/25/2013   Essential hypertension 08/24/2010   Hyperlipidemia LDL goal <70 05/04/2007   Hypothyroidism 04/27/2007   Hepatic steatosis 04/27/2007    PCP: Dr. Charlies Bellini  REFERRING PROVIDER: Dr. Charlies Bellini  REFERRING DIAG: H81.4 (ICD-10-CM) - Persistent vertigo of central origin  THERAPY DIAG:  Dizziness and giddiness  Difficulty in walking, not elsewhere classified  Muscle weakness (generalized)  ONSET DATE: 3 weeks ago  Rationale for Evaluation and Treatment:  Rehabilitation  SUBJECTIVE:   SUBJECTIVE STATEMENT:01/27/24: I am better, in the morning the dizziness is getting better more quickly , was taking until mid afternoon, now gone by lunchtime. I really think that with my magnesium  being so low and I have been taking increased dosage of magnesium  and it is leveling out.  I didn't really perform the dix halpike maneuver due to family event, cooking, cleaning, then needing to rest .  Patient reports the tandem stance exercise is killing her knee oa. Rates knee pain 6/10.  States brandt darhoff exercise is killing her neck.  Rates neck pain 4/10.  States has significant R knee OA and torn R hip labrum which make everything hard.  However, she admits her vertigo is a little better at night now.  States takes her 30 min after brandt-darhoff for her dizziness symptoms to subside.    EVAL: 68 y/o female referred to PT for vertigo.   She began having severe vertigo severals week ago for no apparent reason. She went to the ED, was given Meclizine , Ativan , and benadryl  and did better.  She was initially so symptomatic that she could not even ambulate.   She is ambulating now  with no device, but is quite unsteady and unsafe doing so.  She reports dizziness throughout the day, but worst at night when she rolls over in bed and gets up to go to the bathroom.   She has had an MRI that showed mild white matter disease.  Reports has had short periods of mild vertigo over the yeas that have resolved on their own.  However, this was by far the worst she has ever experienced and was initially completely disabling.   Since onset she is 75% better during the day and  gets better as the day goes on.   At night she is still having a very difficult time and states her symptoms are only 25% improved for nighttime hours. During the day Quick head movements worsen as well when she is sitting.   She denies any falls but thinks she may have a stress fx in her R foot lateral aspect.  States  has had them before, but she hasn't been to get it xrayed.  Pt accompanied by: self  PERTINENT HISTORY: chronic neck and shoulder pain, cervical fusion, asthma, AFIB, angina, HTN, Diabetes  PAIN:  Are you having pain? Yes: NPRS scale: 4/10 sitting; 8/10 worst as day progresses Pain location: R lateral ankle/foot Pain description: aching, sharper as day goes on Aggravating factors: prolonged walking Relieving factors: not walking  PRECAUTIONS: None  RED FLAGS: None   WEIGHT BEARING RESTRICTIONS: No  FALLS: Has patient fallen in last 6 months? No  LIVING ENVIRONMENT: Lives with: lives with their family and lives with their spouse Lives in: House/apartment Stairs: Yes: External: 3 steps; can reach both Has following equipment at home: Single point cane  PLOF: Independent with gait;  retired from office mgmt work  PATIENT GOALS: not be dizzy, have better balance, be able to get OOB at night normally  OBJECTIVE:  Note: Objective measures were completed at Evaluation unless otherwise noted.  DIAGNOSTIC FINDINGS:   MRI HEAD WITHOUT CONTRAST   TECHNIQUE: Multiplanar, multiecho pulse sequences of the brain and surrounding structures were obtained without intravenous contrast.   COMPARISON:  CT the head dated December 24, 2023.   FINDINGS: Brain: There is no restricted diffusion to indicate acute or recent infarction. There is mild periventricular and deep cerebral white matter disease. There is no evidence of hemorrhage, mass, cortical infarct or hydrocephalus.   Vascular: There are normal vascular flow voids present.   Skull and upper cervical spine: Normal marrow signal. No osseous lesions.   Sinuses/Orbits: Clear paranasal sinuses. Status post bilateral lens replacement.   Other: None.   IMPRESSION: 1. Mild periventricular and deep cerebral white matter disease. No apparent acute process.     Electronically Signed   By: Evalene Coho M.D.   On: 12/24/2023  15:46  COGNITION: Overall cognitive status: Within functional limits for tasks assessed   SENSATION: WFL  EDEMA:  None noted  MUSCLE TONE:  WNL BUE and BLE  DTRs:  NT  POSTURE:  rounded shoulders and forward head  Cervical ROM:    Active A/PROM (deg) eval  Flexion 80%; p!  Extension 60%; some p!  Right lateral flexion 45%  Left lateral flexion 45%  Right rotation 50%  Left rotation 50%  (Blank rows = not tested)  STRENGTH:   UPPER EXTREMITY MMT:  MMT Right eval Left eval  Shoulder flexion 4 4  Shoulder extension    Shoulder abduction 4 4  Shoulder adduction    Shoulder extension    Shoulder  internal rotation    Shoulder external rotation 4 4  Middle trapezius    Lower trapezius    Elbow flexion 5 5  Elbow extension 5 5  Wrist flexion    Wrist extension    Wrist ulnar deviation    Wrist radial deviation    Wrist pronation    Wrist supination    Grip strength 5 5   (Blank rows = not tested)   LOWER EXTREMITY MMT:   MMT Right eval Left eval  Hip flexion    Hip abduction    Hip adduction    Hip internal rotation    Hip external rotation    Knee flexion    Knee extension    Ankle dorsiflexion    Ankle plantarflexion    Ankle inversion    Ankle eversion    (Blank rows = not tested)  BED MOBILITY:  Sit to supine independent Supine to sit independent Rolling to Right independent Rolling to Left independent, but has vertigo symptoms  TRANSFERS: Assistive device utilized: None  Sit to stand: CGA Stand to sit: Complete Independence and CGA Chair to chair: NT Floor: nt  GAIT: Gait pattern: Very slow, guarded, tentative gait with short steps, absolutely head movement or trunk rotation, keeps eyes fixed ahead Distance walked: 200 ft into clinic Assistive device utilized: None Level of assistance: CGA Comments: Gait is unsteady and unsafe without a device and she requires CGA from PT today to maintain her balance in clinic.  She is  advised to use at least a cane for now.  If she turns or changes direction she loses balance  FUNCTIONAL TESTS:  TBD  PATIENT SURVEYS:  DHI = 75  VESTIBULAR ASSESSMENT:  GENERAL OBSERVATION: little head or eye movement   SYMPTOM BEHAVIOR:  Subjective history: (see initial note above)  Non-Vestibular symptoms: changes in vision, diplopia, neck pain, and nausea/vomiting  Type of dizziness: Imbalance (Disequilibrium) and feels like falling  Frequency: depends on how much she turns her head  Duration: 1-2 min  Aggravating factors: Induced by position change: lying supine, rolling to the right, rolling to the left, and supine to sit and Induced by motion: bending down to the ground, turning body quickly, and turning head quickly  Relieving factors: avoiding position changes or head movement  Progression of symptoms: better  OCULOMOTOR EXAM:  Ocular Alignment: normal  Ocular ROM: No Limitations  Spontaneous Nystagmus: absent  Gaze-Induced Nystagmus: left beating with right gaze  Smooth Pursuits: intact  Saccades: intact  Convergence/Divergence: appears normal, but not measured.  Eyes converge and diverge appropriately with testing  FRENZEL - FIXATION SUPRESSED:  Ocular Alignment: normal  Spontaneous Nystagmus: absent  Gaze-Induced Nystagmus: nystagmus is present with gaze induced testing, but difficult to tell direction (very faint and variable)  Horizontal head shaking - induced nystagmus: negative  Vertical head shaking - induced nystagmus: negative  Positional tests: Right Dix-Hallpike: no nystagmus Left Dix-Hallpike: no nystagmus  Pressure tests:   VESTIBULAR - OCULAR REFLEX:   Slow VOR: Comment: impaired with sitting finger following  VOR Cancellation: Comment: NT  Head-Impulse Test: NT  Dynamic Visual Acuity: NT   POSITIONAL TESTING: Right Dix-Hallpike: no nystagmus Left Dix-Hallpike: no nystagmus; but symptomatic when returning to sit from both direction  MOTION  SENSITIVITY:  Motion Sensitivity Quotient Intensity: 0 = none, 1 = Lightheaded, 2 = Mild, 3 = Moderate, 4 = Severe, 5 = Vomiting  Intensity  1. Sitting to supine 2  2. Supine to L side 1  3. Supine to R side 3  4. Supine to sitting 4  5. L Hallpike-Dix 1  6. Up from L  1  7. R Hallpike-Dix 1  8. Up from R  4  9. Sitting, head tipped to L knee 0  10. Head up from L knee 0  11. Sitting, head tipped to R knee 0  12. Head up from R knee 0  13. Sitting head turns x5 2  14.Sitting head nods x5 3  15. In stance, 180 turn to L  2  16. In stance, 180 turn to R 2     Total = 26   OTHOSTATICS: not done  FUNCTIONAL GAIT: TBD                                                                                                                             TREATMENT DATE:  01/27/24:  Assessed pts cervical spine Rom and strength Ue's  unable to perform the vestibular training with faster head movements due to neck pain, primarily R upper traps. Utilized VOR test, pt with 4 line difference for horizontal and vertical head movement, + for vestibular hypofunction  Attempted stabilization ex for lower cervical spine, upper thoracic , pt with pronounced upper traps region pain with seated yellow t band b shlder ER stabilizing ball against wall post head, so attempted smaller movements and without ball, still with R upper traps pain.  Pt able to perform supine B shoulder ER with yellow t band without neck pain so to add to her home routine  Manual : spine alt bouts of cervical distraction, R upper traps, levator, suboccipital gentle stretching, with relief of neck pain after this technique.  01/21/24 THERAPEUTIC EXERCISE: To improve strength.  Demonstration, verbal and tactile cues throughout for technique. NuStep L2 x 6'  NEUROMUSCULAR RE-EDUCATION: To improve balance and vestibular retraining. Corner stagger stance with gaze stabilization x 1' head turns x 10; head nods x 10 Corner feet together EC  head turns x 10; head nods x 10 SLS w/ contralateral toe touch for balance x 1' BLE Step stance looking R, then looking L x 1' BLE Hallpike dix recheck bilaterally produces no symptoms or nystagmus;  very dizzy on sitting up though CRT x 1 for L ear to see if it would help.   Canalith Repositioning:  Comment: not done due to no symptoms or nystagmus until returning to sit Gaze Adaptation:  x1 Viewing Horizontal: Comment: HEP  Habituation:  Brandt-Daroff: comment: 10 each direction  SELF CARE: Provided education on PT POC progression, to promote safe home environment, and on avoiding quick/sudden head movements.   PATIENT EDUCATION: Education details: HEP review, HEP updated Person educated: Patient Education method: Explanation, Demonstration, Tactile cues, Verbal cues, and Handouts Education comprehension: verbalized understanding, verbal cues required, tactile cues required, and needs further education  HOME EXERCISE PROGRAM: Access Code: [email or web address] URL: [email or web address] Date: 01/18/2024 Prepared by: Garnette Montclair  Exercises - Brandt-Daroff  Vestibular Exercise  - 1 x daily - 7 x weekly - 3 sets - 10 reps - Tandem Stance  - 1 x daily - 7 x weekly - 3 sets - 10 reps - Seated Gaze Stabilization with Head Rotation  - 1 x daily - 7 x weekly - 3 sets - 10 reps - Seated Gaze Stabilization with Head Nod  - 1 x daily - 7 x weekly - 3 sets - 10 reps   GOALS: Goals reviewed with patient? Yes  SHORT TERM GOALS: Target date: 02/13/2024  Patient will report 25-50% subjective improvement in symptoms of dizziness Baseline: Motion sensitivity quotient is 26 Goal status: INITIAL  2.  Patient will be independent with initial HEP Baseline: 100% PT assist required for correct completion Goal status: INITIAL  LONG TERM GOALS: Target date: 03/12/24  Patient will improve on DHI to < 40 to indicate decreased subjective symptoms of dizziness, better dizziness control,  and improved QOL Baseline: 75/100 Goal status: INITIAL  2.  Patient will ambulate with no device indoors, outdoors, and on steps with good balance independently for return to community Baseline:  Reqires CGA to min A to ambulate in clinic Goal status: INITIAL  3.  Patient will demonstrate DGI score improvement to > 20/24 to decrease falls risk Baseline: TBD Goal status: INITIAL  4.  Patient will be able to ambulate down a corridor with head turns and direction changes with minimal to no c/o dizziness Baseline:  has symptoms with seated head turns and nods Goal status: INITIAL ASSESSMENT:  CLINICAL IMPRESSION: Pt with neck pan with any attempted vestibular training, + for reduced vestibular function today with testing with eye chart.  Focused more on neck stability as her neck pain is limiting her ability to perform any vestibular ex.  PT still beneficial for dizziness, unsteady gait, gait abnormality, hip weakness.  Will reassess, discuss again next visit as pt feels like her magnesium  supplement is helping as well. Continue per POC   EVAL: Patient is a 69 y.o.  who was seen today for physical therapy evaluation and treatment for vertigo of central origin.   Patient reports symptoms started a couple of weeks ago for no apparent reason and were severely disabling to the point she could not ambulate.   She was started on Meclizine  and is doing better.  Her brain MRI did not show any CVA or other acute abnormalities.  She is still having symptoms when she rolls in bed or gets OOB at night.  Night is her worst time.   Hallpike-Dix testing today did not cause any symptoms or nystagmus.   However, when she rolls to the R or goes from supine to sit she becomes very dizzy.   She does have some nystagmus present with gaze stabilization testing and has dizziness with head movements of nodding worse than turning.  She may have a cupulolithiasis along with some central origin issues, and is given  Brandt-Darhoff habituation exercises initially to see if otoconia might dislodge.   She is moderately unsteady with ambulation and needs an assistive device for safety at this time.  Without a device she is a high fall risk presently.   Amina, has deficits with dizziness, impaired cervical ROM, balance, and safety.   PT is necessary to address each one of these deficits and she is agreeable to the POC  OBJECTIVE IMPAIRMENTS: Abnormal gait, decreased activity tolerance, decreased balance, difficulty walking, and dizziness.   ACTIVITY LIMITATIONS: lifting, bending, sitting, squatting, sleeping,  and locomotion level  PARTICIPATION LIMITATIONS: meal prep, cleaning, laundry, driving, shopping, community activity, and yard work  PERSONAL FACTORS: Age and 1-2 comorbidities: chronic neck and shoulder pain, cervical fusion, asthma, AFIB, angina, HTN, Diabetes are also affecting patient's functional outcome.   REHAB POTENTIAL: Good  CLINICAL DECISION MAKING: Evolving/moderate complexity  EVALUATION COMPLEXITY: Moderate   PLAN:  PT FREQUENCY: 1-2x/week  PT DURATION: 8 weeks  PLANNED INTERVENTIONS: [id number]- PT Re-evaluation, 97750- Physical Performance Testing, 97110-Therapeutic exercises, 97530- Therapeutic activity, [id number]- Neuromuscular re-education, 97535- Self Care, [id number]- Manual therapy, [phone]- Gait training, [phone]- Canalith repositioning, Patient/Family education, Balance training, Stair training, and Spinal mobilization  PLAN FOR NEXT SESSION: manual techniques and stab ex for neck  Brainard [place] L Ayce Pietrzyk, PT, DPT, OCS 01/28/2024, 5:18 PM

## 2024-01-30 ENCOUNTER — Ambulatory Visit: Admitting: Rehabilitation

## 2024-01-30 ENCOUNTER — Encounter: Payer: Self-pay | Admitting: Rehabilitation

## 2024-01-30 DIAGNOSIS — M542 Cervicalgia: Secondary | ICD-10-CM

## 2024-01-30 DIAGNOSIS — R42 Dizziness and giddiness: Secondary | ICD-10-CM | POA: Diagnosis not present

## 2024-01-30 DIAGNOSIS — M6281 Muscle weakness (generalized): Secondary | ICD-10-CM

## 2024-01-30 DIAGNOSIS — R262 Difficulty in walking, not elsewhere classified: Secondary | ICD-10-CM

## 2024-01-30 NOTE — Therapy (Signed)
 OUTPATIENT PHYSICAL THERAPY VESTIBULAR/  BALANCE TREATMENT  (WITH ADDITIONAL POC FOR CERVICAL SPINE PAIN )     Patient Name: [name] [name] MRN: [id number] DOB:December 18, 1955, 68 y.o., female Today's Date: 01/30/2024  END OF SESSION:  PT End of Session - 01/30/24 0847     Visit Number 4    Date for PT Re-Evaluation 03/11/24    Authorization Type MCR & [employer] supplement gelene for [facility])    PT Start Time [phone]    PT Stop Time 0923    PT Time Calculation (min) 41 min    Activity Tolerance Patient tolerated treatment well    Behavior During Therapy [facility] for tasks assessed/performed           Past Medical History:  Diagnosis Date   Accelerating angina (HCC) 11/04/2017   Allergy     Asthma    Atrial fibrillation (HCC) 08/27/2020   Bone spur 10/11/2021   Cataract    Chronic kidney disease    Chronic pain disorder    neck and shoulder   Chronic pain of right upper extremity 11/10/2017   Coronary artery disease 2019   Stent x2   CTS (carpal tunnel syndrome) 01/27/2013   right   Diabetes mellitus type II    Diabetic gastroparesis (HCC)    Dysrhythmia    A. Fib   Esophageal reflux 08/25/2013   [place], Dr Norvell   FATTY LIVER DISEASE 04/27/2007   Qualifier: Diagnosis of  By: Anice CMA, Darlene     Focal muscle atrophy 09/26/2015   Fundic gland polyps of stomach, benign    Globus sensation 04/13/2018   Hordeolum externum of right upper eyelid 12/24/2021   HTN (hypertension)    Hx of adenomatous colonic polyps 12/10/2021   Hyperlipidemia, mixed 05/04/2007   Qualifier: Diagnosis of  By: Georgian ROSALEA CHARM Lamar crestor  caused myalgias even at low dose Livalo  caused myalgias Lipitor  Mother with severe reaction myalgias Simvastatin, Welchol     Hypokalemia 05/25/2013   Improved stopping HCTZ. Was noted to have an elevated glucose when K was low. Recheck renal next week after starting Maxzide   Hypomagnesemia 08/10/2014   Hypothyroidism    Iron  deficiency anemia  08/07/2018   Iron  malabsorption 08/07/2018   Laryngitis 08/25/2017   Leg cramps 04/15/2016   Nausea without vomiting 08/08/2014   NONSPEC ELEVATION OF LEVELS OF TRANSAMINASE/LDH 05/01/2007   Qualifier: Diagnosis of  By: Eyvonne Jacob     Osteoarthritis    chronic, right knee (11/04/2017)   Plantar fasciitis    Pneumonia    Rosacea 10/22/2016   Urine incontinence    Past Surgical History:  Procedure Laterality Date   ANTERIOR CERVICAL DECOMP/DISCECTOMY FUSION N/A 11/25/2022   Procedure: ANTERIOR CERVICAL DISCECTOMY AND FUSION CERVICAL FIVE TO SEVEN;  Surgeon: Burnetta Aures, MD;  Location: [facility] OR;  Service: Orthopedics;  Laterality: N/A;   AUGMENTATION MAMMAPLASTY Bilateral 2002   BLADDER SUSPENSION  1981   CATARACT EXTRACTION W/ INTRAOCULAR LENS IMPLANT Bilateral    COLONOSCOPY     CORONARY PRESSURE/FFR STUDY N/A 11/04/2017   Procedure: INTRAVASCULAR PRESSURE WIRE/FFR STUDY;  Surgeon: Mady Bruckner, MD;  Location: [facility] INVASIVE CV LAB;  Service: Cardiovascular;  Laterality: N/A;   CORONARY STENT INTERVENTION N/A 11/04/2017   Procedure: CORONARY STENT INTERVENTION;  Surgeon: Mady Bruckner, MD;  Location: [facility] INVASIVE CV LAB;  Service: Cardiovascular;  Laterality: N/A;   CORONARY ULTRASOUND/IVUS N/A 11/04/2017   Procedure: Intravascular Ultrasound/IVUS;  Surgeon: Mady Bruckner, MD;  Location: [facility] INVASIVE CV LAB;  Service: Cardiovascular;  Laterality: N/A;   ENDOVENOUS ABLATION SAPHENOUS VEIN W/ LASER Left 12/29/2019   endovenous laser ablation left greater saphenous vein by Carlin Haddock MD    ESOPHAGOGASTRODUODENOSCOPY     KNEE ARTHROSCOPY Right 1998, [id number]   LAPAROSCOPIC CHOLECYSTECTOMY  2007   LEFT HEART CATH AND CORONARY ANGIOGRAPHY N/A 11/04/2017   Procedure: LEFT HEART CATH AND CORONARY ANGIOGRAPHY;  Surgeon: Mady Bruckner, MD;  Location: [facility] INVASIVE CV LAB;  Service: Cardiovascular;  Laterality: N/A;   LEFT HEART CATHETERIZATION WITH CORONARY ANGIOGRAM N/A 11/21/2011    Procedure: LEFT HEART CATHETERIZATION WITH CORONARY ANGIOGRAM;  Surgeon: Maude JAYSON Emmer, MD;  Location: [facility] CATH LAB;  Service: Cardiovascular;  Laterality: N/A;   TONSILLECTOMY  1975   VAGINAL HYSTERECTOMY  1983   still have my ovaries   Patient Active Problem List   Diagnosis Date Noted   Persistent vertigo-unknown origin 01/01/2024   Gait instability 01/01/2024   Low calcium  levels 01/01/2024   White matter disease, unspecified-mild periventricular and deep cerebral 01/01/2024   Hypomagnesemia 11/26/2022   S/P cervical spinal fusion 11/25/2022   Hx of adenomatous colonic polyps 12/10/2021   Aortic atherosclerosis (HCC) 10/30/2021   Thoracic radiculopathy 10/26/2021   Paresthesia 10/26/2021   Right-sided thoracic back pain 10/26/2021   Neurosensory deficit 10/26/2021   Thoracic degenerative disc disease 10/11/2021   Dextroscoliosis of thoracic spine 10/11/2021   PAF (paroxysmal atrial fibrillation) (HCC) 07/30/2021   Hyperlipidemia associated with type 2 diabetes mellitus (HCC) 01/01/2021   Acquired thrombophilia (HCC) 09/07/2020   Peripheral vascular disease (HCC) 09/01/2020   Statin not tolerated 09/01/2020   Unstable angina (HCC) 08/26/2020   Varicose veins of both lower extremities with pain 06/15/2019   Iron  deficiency anemia 08/07/2018   Iron  malabsorption 08/07/2018   CAD S/P percutaneous coronary angioplasty 11/12/2017   Abnormal cardiac CT angiography 11/04/2017   Diabetic gastroparesis (HCC) 07/19/2014   Esophageal reflux 08/25/2013   Essential hypertension 08/24/2010   Hyperlipidemia LDL goal <70 05/04/2007   Hypothyroidism 04/27/2007   Hepatic steatosis 04/27/2007    PCP: Dr. Charlies Bellini  REFERRING PROVIDER: Dr. Charlies Bellini  REFERRING DIAG: H81.4 (ICD-10-CM) - Persistent vertigo of central origin  THERAPY DIAG:  Dizziness and giddiness  Difficulty in walking, not elsewhere classified  Muscle weakness (generalized)  Cervicalgia  ONSET DATE: 3  weeks ago  Rationale for Evaluation and Treatment: Rehabilitation  SUBJECTIVE:   SUBJECTIVE STATEMENT:  Patient states the focus on her neck pain last visit actually improved her dizziness symptoms.   She requests focus on the cervical spine today to see if her vertigo will continue to improve with this type of treatment  EVAL: 68 y/o female referred to PT for vertigo.   She began having severe vertigo severals week ago for no apparent reason. She went to the ED, was given Meclizine , Ativan , and benadryl  and did better.  She was initially so symptomatic that she could not even ambulate.   She is ambulating now with no device, but is quite unsteady and unsafe doing so.  She reports dizziness throughout the day, but worst at night when she rolls over in bed and gets up to go to the bathroom.   She has had an MRI that showed mild white matter disease.  Reports has had short periods of mild vertigo over the yeas that have resolved on their own.  However, this was by far the worst she has ever experienced and was initially completely disabling.   Since onset she is 75%  better during the day and  gets better as the day goes on.   At night she is still having a very difficult time and states her symptoms are only 25% improved for nighttime hours. During the day Quick head movements worsen as well when she is sitting.   She denies any falls but thinks she may have a stress fx in her R foot lateral aspect.  States has had them before, but she hasn't been to get it xrayed.  Pt accompanied by: self  PERTINENT HISTORY: chronic neck and shoulder pain, cervical fusion, asthma, AFIB, angina, HTN, Diabetes  PAIN:  Are you having pain? Yes: NPRS scale: 4/10 sitting; 8/10 worst as day progresses Pain location: R lateral ankle/foot Pain description: aching, sharper as day goes on Aggravating factors: prolonged walking Relieving factors: not walking  PRECAUTIONS: None  RED FLAGS: None   WEIGHT BEARING  RESTRICTIONS: No  FALLS: Has patient fallen in last 6 months? No  LIVING ENVIRONMENT: Lives with: lives with their family and lives with their spouse Lives in: House/apartment Stairs: Yes: External: 3 steps; can reach both Has following equipment at home: Single point cane  PLOF: Independent with gait;  retired from office mgmt work  PATIENT GOALS: not be dizzy, have better balance, be able to get OOB at night normally  OBJECTIVE:  Note: Objective measures were completed at Evaluation unless otherwise noted.  DIAGNOSTIC FINDINGS:   MRI HEAD WITHOUT CONTRAST   TECHNIQUE: Multiplanar, multiecho pulse sequences of the brain and surrounding structures were obtained without intravenous contrast.   COMPARISON:  CT the head dated December 24, 2023.   FINDINGS: Brain: There is no restricted diffusion to indicate acute or recent infarction. There is mild periventricular and deep cerebral white matter disease. There is no evidence of hemorrhage, mass, cortical infarct or hydrocephalus.   Vascular: There are normal vascular flow voids present.   Skull and upper cervical spine: Normal marrow signal. No osseous lesions.   Sinuses/Orbits: Clear paranasal sinuses. Status post bilateral lens replacement.   Other: None.   IMPRESSION: 1. Mild periventricular and deep cerebral white matter disease. No apparent acute process.     Electronically Signed   By: Evalene Coho M.D.   On: 12/24/2023 15:46  COGNITION: Overall cognitive status: Within functional limits for tasks assessed   SENSATION: WFL  EDEMA:  None noted  MUSCLE TONE:  WNL BUE and BLE  DTRs:  NT  POSTURE:  rounded shoulders and forward head  Cervical ROM:    Active A/PROM (deg) eval  Flexion 80%; p!  Extension 60%; some p!  Right lateral flexion 45%  Left lateral flexion 45%  Right rotation 50%  Left rotation 50%  (Blank rows = not tested)  STRENGTH:   UPPER EXTREMITY MMT:  MMT Right eval  Left eval  Shoulder flexion 4 4  Shoulder extension    Shoulder abduction 4 4  Shoulder adduction    Shoulder extension    Shoulder internal rotation    Shoulder external rotation 4 4  Middle trapezius    Lower trapezius    Elbow flexion 5 5  Elbow extension 5 5  Wrist flexion    Wrist extension    Wrist ulnar deviation    Wrist radial deviation    Wrist pronation    Wrist supination    Grip strength 5 5   (Blank rows = not tested)   LOWER EXTREMITY MMT:   MMT Right eval Left eval  Hip flexion  Hip abduction    Hip adduction    Hip internal rotation    Hip external rotation    Knee flexion    Knee extension    Ankle dorsiflexion    Ankle plantarflexion    Ankle inversion    Ankle eversion    (Blank rows = not tested)  BED MOBILITY:  Sit to supine independent Supine to sit independent Rolling to Right independent Rolling to Left independent, but has vertigo symptoms  TRANSFERS: Assistive device utilized: None  Sit to stand: CGA Stand to sit: Complete Independence and CGA Chair to chair: NT Floor: nt  GAIT: Gait pattern: Very slow, guarded, tentative gait with short steps, absolutely head movement or trunk rotation, keeps eyes fixed ahead Distance walked: 200 ft into clinic Assistive device utilized: None Level of assistance: CGA Comments: Gait is unsteady and unsafe without a device and she requires CGA from PT today to maintain her balance in clinic.  She is advised to use at least a cane for now.  If she turns or changes direction she loses balance  FUNCTIONAL TESTS:  TBD  PATIENT SURVEYS:  DHI = 75  VESTIBULAR ASSESSMENT:  GENERAL OBSERVATION: little head or eye movement   SYMPTOM BEHAVIOR:  Subjective history: (see initial note above)  Non-Vestibular symptoms: changes in vision, diplopia, neck pain, and nausea/vomiting  Type of dizziness: Imbalance (Disequilibrium) and feels like falling  Frequency: depends on how much she turns her  head  Duration: 1-2 min  Aggravating factors: Induced by position change: lying supine, rolling to the right, rolling to the left, and supine to sit and Induced by motion: bending down to the ground, turning body quickly, and turning head quickly  Relieving factors: avoiding position changes or head movement  Progression of symptoms: better  OCULOMOTOR EXAM:  Ocular Alignment: normal  Ocular ROM: No Limitations  Spontaneous Nystagmus: absent  Gaze-Induced Nystagmus: left beating with right gaze  Smooth Pursuits: intact  Saccades: intact  Convergence/Divergence: appears normal, but not measured.  Eyes converge and diverge appropriately with testing  FRENZEL - FIXATION SUPRESSED:  Ocular Alignment: normal  Spontaneous Nystagmus: absent  Gaze-Induced Nystagmus: nystagmus is present with gaze induced testing, but difficult to tell direction (very faint and variable)  Horizontal head shaking - induced nystagmus: negative  Vertical head shaking - induced nystagmus: negative  Positional tests: Right Dix-Hallpike: no nystagmus Left Dix-Hallpike: no nystagmus  Pressure tests:   VESTIBULAR - OCULAR REFLEX:   Slow VOR: Comment: impaired with sitting finger following  VOR Cancellation: Comment: NT  Head-Impulse Test: NT  Dynamic Visual Acuity: NT   POSITIONAL TESTING: Right Dix-Hallpike: no nystagmus Left Dix-Hallpike: no nystagmus; but symptomatic when returning to sit from both direction  MOTION SENSITIVITY:  Motion Sensitivity Quotient Intensity: 0 = none, 1 = Lightheaded, 2 = Mild, 3 = Moderate, 4 = Severe, 5 = Vomiting  Intensity  1. Sitting to supine 2  2. Supine to L side 1  3. Supine to R side 3  4. Supine to sitting 4  5. L Hallpike-Dix 1  6. Up from L  1  7. R Hallpike-Dix 1  8. Up from R  4  9. Sitting, head tipped to L knee 0  10. Head up from L knee 0  11. Sitting, head tipped to R knee 0  12. Head up from R knee 0  13. Sitting head turns x5 2  14.Sitting head nods  x5 3  15. In stance, 180 turn to L  2  16. In stance, 180 turn to R 2     Total = 26   OTHOSTATICS: not done  FUNCTIONAL GAIT: TBD                                                                                                                             TREATMENT DATE:  01/30/24 THERAPEUTIC EXERCISE: To improve strength and ROM.  Demonstration, verbal and tactile cues throughout for technique. UBE L0 x 4' F/2' B  --initially increased pain over the posterior L shoulder/scapular area, but it resolved quickly Seated cervical extension x 10 Seated cervical retraction x 10  THERAPEUTIC ACTIVITIES: To improve functional performance.  Demonstration, verbal and tactile cues throughout for technique. Standing rows YTB x 2/10 BUE Standing shoulder ext YTB x 2/10 LUE, then RUE  NEUROMUSCULAR RE-EDUCATION: To improve kinesthesia, posture, and muscular relaxation reeducation. Standing wall/ball cervical retraction x 2/10 Standing wall/ball scapular retraction w/ cerv retract x 2/10 Supine scapular depression w/ cerv retract x 2/10 BUE  MANUAL THERAPY: To promote reduced pain utilizing manual TP therapy and myofascial release. Deep pressure Tp release to B UT and levator Tps  01/27/24:  Assessed pts cervical spine Rom and strength Ue's  unable to perform the vestibular training with faster head movements due to neck pain, primarily R upper traps. Utilized VOR test, pt with 4 line difference for horizontal and vertical head movement, + for vestibular hypofunction  Attempted stabilization ex for lower cervical spine, upper thoracic , pt with pronounced upper traps region pain with seated yellow t band b shlder ER stabilizing ball against wall post head, so attempted smaller movements and without ball, still with R upper traps pain.  Pt able to perform supine B shoulder ER with yellow t band without neck pain so to add to her home routine  Manual : spine alt bouts of cervical distraction, R upper  traps, levator, suboccipital gentle stretching, with relief of neck pain after this technique.  01/21/24 THERAPEUTIC EXERCISE: To improve strength.  Demonstration, verbal and tactile cues throughout for technique. NuStep L2 x 6'  NEUROMUSCULAR RE-EDUCATION: To improve balance and vestibular retraining. Corner stagger stance with gaze stabilization x 1' head turns x 10; head nods x 10 Corner feet together EC head turns x 10; head nods x 10 SLS w/ contralateral toe touch for balance x 1' BLE Step stance looking R, then looking L x 1' BLE Hallpike dix recheck bilaterally produces no symptoms or nystagmus;  very dizzy on sitting up though CRT x 1 for L ear to see if it would help.   Canalith Repositioning:  Comment: not done due to no symptoms or nystagmus until returning to sit Gaze Adaptation:  x1 Viewing Horizontal: Comment: HEP  Habituation:  Brandt-Daroff: comment: 10 each direction  SELF CARE: Provided education on PT POC progression, to promote safe home environment, and on avoiding quick/sudden head movements.   PATIENT EDUCATION: Education details: HEP review, HEP updated  Person educated: Patient Education method: Explanation, Demonstration, Tactile cues, Verbal cues, and Handouts Education comprehension: verbalized understanding, verbal cues required, tactile cues required, and needs further education  HOME EXERCISE PROGRAM: Access Code: [email or web address] URL: [email or web address] Date: 01/18/2024 Prepared by: Garnette Montclair  Exercises - Brandt-Daroff Vestibular Exercise  - 1 x daily - 7 x weekly - 3 sets - 10 reps - Tandem Stance  - 1 x daily - 7 x weekly - 3 sets - 10 reps - Seated Gaze Stabilization with Head Rotation  - 1 x daily - 7 x weekly - 3 sets - 10 reps - Seated Gaze Stabilization with Head Nod  - 1 x daily - 7 x weekly - 3 sets - 10 reps   GOALS: Goals reviewed with patient? Yes  SHORT TERM GOALS: Target date: 02/13/2024  Patient will report  25-50% subjective improvement in symptoms of dizziness Baseline: Motion sensitivity quotient is 26 Goal status: INITIAL  2.  Patient will be independent with initial HEP Baseline: 100% PT assist required for correct completion Goal status: INITIAL  LONG TERM GOALS: Target date: 03/12/24  Patient will improve on DHI to < 40 to indicate decreased subjective symptoms of dizziness, better dizziness control, and improved QOL Baseline: 75/100 Goal status: INITIAL  2.  Patient will ambulate with no device indoors, outdoors, and on steps with good balance independently for return to community Baseline:  Reqires CGA to min A to ambulate in clinic Goal status: INITIAL  3.  Patient will demonstrate DGI score improvement to > 20/24 to decrease falls risk Baseline: TBD Goal status: INITIAL  4.  Patient will be able to ambulate down a corridor with head turns and direction changes with minimal to no c/o dizziness Baseline:  has symptoms with seated head turns and nods Goal status: INITIAL ASSESSMENT:  CLINICAL IMPRESSION: Patient reports   EVAL: Patient is a 68 y.o.  who was seen today for physical therapy evaluation and treatment for vertigo of central origin.   Patient reports symptoms started a couple of weeks ago for no apparent reason and were severely disabling to the point she could not ambulate.   She was started on Meclizine  and is doing better.  Her brain MRI did not show any CVA or other acute abnormalities.  She is still having symptoms when she rolls in bed or gets OOB at night.  Night is her worst time.   Hallpike-Dix testing today did not cause any symptoms or nystagmus.   However, when she rolls to the R or goes from supine to sit she becomes very dizzy.   She does have some nystagmus present with gaze stabilization testing and has dizziness with head movements of nodding worse than turning.  She may have a cupulolithiasis along with some central origin issues, and is given  Brandt-Darhoff habituation exercises initially to see if otoconia might dislodge.   She is moderately unsteady with ambulation and needs an assistive device for safety at this time.  Without a device she is a high fall risk presently.   Mickie, has deficits with dizziness, impaired cervical ROM, balance, and safety.   PT is necessary to address each one of these deficits and she is agreeable to the POC  OBJECTIVE IMPAIRMENTS: Abnormal gait, decreased activity tolerance, decreased balance, difficulty walking, and dizziness.   ACTIVITY LIMITATIONS: lifting, bending, sitting, squatting, sleeping, and locomotion level  PARTICIPATION LIMITATIONS: meal prep, cleaning, laundry, driving, shopping, community activity, and yard work  PERSONAL FACTORS: Age and 1-2 comorbidities:  chronic neck and shoulder pain, cervical fusion, asthma, AFIB, angina, HTN, Diabetes are also affecting patient's functional outcome.   REHAB POTENTIAL: Good  CLINICAL DECISION MAKING: Evolving/moderate complexity  EVALUATION COMPLEXITY: Moderate   PLAN:  PT FREQUENCY: 1-2x/week  PT DURATION: 8 weeks  PLANNED INTERVENTIONS: 97164- PT Re-evaluation, 97750- Physical Performance Testing, 97110-Therapeutic exercises, 97530- Therapeutic activity, [id number]- Neuromuscular re-education, 97535- Self Care, [id number]- Manual therapy, [id number]- Gait training, [phone]- Canalith repositioning, H9716- Electrical stimulation (unattended), 97016- Vasopneumatic device, [id number]- Ultrasound, [id number]- Ionotophoresis 4mg /ml Dexamethasone , [id number] (1-2 muscles), [id number] (3+ muscles)- Dry Needling, Patient/Family education, Balance training, Stair training, Taping, Cryotherapy, and Moist heat  PLAN FOR NEXT SESSION: Tp massage, MFR to neck and scapular musculature; scapular strengthening, cervical stabilization; eye tracking.   Will do modalities once orders are back in  [place], PT, DPT, OCS 01/30/2024, 8:20 PM

## 2024-02-02 ENCOUNTER — Ambulatory Visit: Admitting: Rehabilitation

## 2024-02-02 DIAGNOSIS — M542 Cervicalgia: Secondary | ICD-10-CM

## 2024-02-02 DIAGNOSIS — M6281 Muscle weakness (generalized): Secondary | ICD-10-CM

## 2024-02-02 DIAGNOSIS — R42 Dizziness and giddiness: Secondary | ICD-10-CM | POA: Diagnosis not present

## 2024-02-02 DIAGNOSIS — R262 Difficulty in walking, not elsewhere classified: Secondary | ICD-10-CM

## 2024-02-02 NOTE — Therapy (Signed)
 OUTPATIENT PHYSICAL THERAPY VESTIBULAR/  BALANCE TREATMENT  (WITH ADDITIONAL POC FOR CERVICAL SPINE PAIN )     Patient Name: [name] [name] MRN: [id number] DOB:Feb 24, 1956, 68 y.o., female Today's Date: 02/02/2024  END OF SESSION:  PT End of Session - 02/02/24 1023     Visit Number 5    PT Start Time 1020    Activity Tolerance Patient tolerated treatment well;No increased pain    Behavior During Therapy [facility] for tasks assessed/performed           Past Medical History:  Diagnosis Date   Accelerating angina (HCC) 11/04/2017   Allergy     Asthma    Atrial fibrillation (HCC) 08/27/2020   Bone spur 10/11/2021   Cataract    Chronic kidney disease    Chronic pain disorder    neck and shoulder   Chronic pain of right upper extremity 11/10/2017   Coronary artery disease 2019   Stent x2   CTS (carpal tunnel syndrome) 01/27/2013   right   Diabetes mellitus type II    Diabetic gastroparesis (HCC)    Dysrhythmia    A. Fib   Esophageal reflux 08/25/2013   [place], Dr Norvell   FATTY LIVER DISEASE 04/27/2007   Qualifier: Diagnosis of  By: Anice CMA, Darlene     Focal muscle atrophy 09/26/2015   Fundic gland polyps of stomach, benign    Globus sensation 04/13/2018   Hordeolum externum of right upper eyelid 12/24/2021   HTN (hypertension)    Hx of adenomatous colonic polyps 12/10/2021   Hyperlipidemia, mixed 05/04/2007   Qualifier: Diagnosis of  By: Georgian ROSALEA CHARM Lamar crestor  caused myalgias even at low dose Livalo  caused myalgias Lipitor  Mother with severe reaction myalgias Simvastatin, Welchol     Hypokalemia 05/25/2013   Improved stopping HCTZ. Was noted to have an elevated glucose when K was low. Recheck renal next week after starting Maxzide   Hypomagnesemia 08/10/2014   Hypothyroidism    Iron  deficiency anemia 08/07/2018   Iron  malabsorption 08/07/2018   Laryngitis 08/25/2017   Leg cramps 04/15/2016   Nausea without vomiting 08/08/2014   NONSPEC ELEVATION  OF LEVELS OF TRANSAMINASE/LDH 05/01/2007   Qualifier: Diagnosis of  By: Eyvonne Jacob     Osteoarthritis    chronic, right knee (11/04/2017)   Plantar fasciitis    Pneumonia    Rosacea 10/22/2016   Urine incontinence    Past Surgical History:  Procedure Laterality Date   ANTERIOR CERVICAL DECOMP/DISCECTOMY FUSION N/A 11/25/2022   Procedure: ANTERIOR CERVICAL DISCECTOMY AND FUSION CERVICAL FIVE TO SEVEN;  Surgeon: Burnetta Aures, MD;  Location: [facility] OR;  Service: Orthopedics;  Laterality: N/A;   AUGMENTATION MAMMAPLASTY Bilateral 2002   BLADDER SUSPENSION  1981   CATARACT EXTRACTION W/ INTRAOCULAR LENS IMPLANT Bilateral    COLONOSCOPY     CORONARY PRESSURE/FFR STUDY N/A 11/04/2017   Procedure: INTRAVASCULAR PRESSURE WIRE/FFR STUDY;  Surgeon: Mady Bruckner, MD;  Location: [facility] INVASIVE CV LAB;  Service: Cardiovascular;  Laterality: N/A;   CORONARY STENT INTERVENTION N/A 11/04/2017   Procedure: CORONARY STENT INTERVENTION;  Surgeon: Mady Bruckner, MD;  Location: [facility] INVASIVE CV LAB;  Service: Cardiovascular;  Laterality: N/A;   CORONARY ULTRASOUND/IVUS N/A 11/04/2017   Procedure: Intravascular Ultrasound/IVUS;  Surgeon: Mady Bruckner, MD;  Location: [facility] INVASIVE CV LAB;  Service: Cardiovascular;  Laterality: N/A;   ENDOVENOUS ABLATION SAPHENOUS VEIN W/ LASER Left 12/29/2019   endovenous laser ablation left greater saphenous vein by Carlin Haddock MD    ESOPHAGOGASTRODUODENOSCOPY  KNEE ARTHROSCOPY Right 1998, [id number]   LAPAROSCOPIC CHOLECYSTECTOMY  2007   LEFT HEART CATH AND CORONARY ANGIOGRAPHY N/A 11/04/2017   Procedure: LEFT HEART CATH AND CORONARY ANGIOGRAPHY;  Surgeon: Mady Bruckner, MD;  Location: [facility] INVASIVE CV LAB;  Service: Cardiovascular;  Laterality: N/A;   LEFT HEART CATHETERIZATION WITH CORONARY ANGIOGRAM N/A 11/21/2011   Procedure: LEFT HEART CATHETERIZATION WITH CORONARY ANGIOGRAM;  Surgeon: Maude JAYSON Emmer, MD;  Location: [facility] CATH LAB;  Service: Cardiovascular;   Laterality: N/A;   TONSILLECTOMY  1975   VAGINAL HYSTERECTOMY  1983   still have my ovaries   Patient Active Problem List   Diagnosis Date Noted   Persistent vertigo-unknown origin 01/01/2024   Gait instability 01/01/2024   Low calcium  levels 01/01/2024   White matter disease, unspecified-mild periventricular and deep cerebral 01/01/2024   Hypomagnesemia 11/26/2022   S/P cervical spinal fusion 11/25/2022   Hx of adenomatous colonic polyps 12/10/2021   Aortic atherosclerosis (HCC) 10/30/2021   Thoracic radiculopathy 10/26/2021   Paresthesia 10/26/2021   Right-sided thoracic back pain 10/26/2021   Neurosensory deficit 10/26/2021   Thoracic degenerative disc disease 10/11/2021   Dextroscoliosis of thoracic spine 10/11/2021   PAF (paroxysmal atrial fibrillation) (HCC) 07/30/2021   Hyperlipidemia associated with type 2 diabetes mellitus (HCC) 01/01/2021   Acquired thrombophilia (HCC) 09/07/2020   Peripheral vascular disease (HCC) 09/01/2020   Statin not tolerated 09/01/2020   Unstable angina (HCC) 08/26/2020   Varicose veins of both lower extremities with pain 06/15/2019   Iron  deficiency anemia 08/07/2018   Iron  malabsorption 08/07/2018   CAD S/P percutaneous coronary angioplasty 11/12/2017   Abnormal cardiac CT angiography 11/04/2017   Diabetic gastroparesis (HCC) 07/19/2014   Esophageal reflux 08/25/2013   Essential hypertension 08/24/2010   Hyperlipidemia LDL goal <70 05/04/2007   Hypothyroidism 04/27/2007   Hepatic steatosis 04/27/2007    PCP: Dr. Charlies Bellini  REFERRING PROVIDER: Dr. Charlies Bellini  REFERRING DIAG: H81.4 (ICD-10-CM) - Persistent vertigo of central origin  THERAPY DIAG:  Dizziness and giddiness  Difficulty in walking, not elsewhere classified  Muscle weakness (generalized)  Cervicalgia  ONSET DATE: 3 weeks ago  Rationale for Evaluation and Treatment: Rehabilitation  SUBJECTIVE:   SUBJECTIVE STATEMENT:  Patient states the focus on her neck  pain last visit actually improved her dizziness symptoms.   She requests focus on the cervical spine today to see if her vertigo will continue to improve with this type of treatment  EVAL: 68 y/o female referred to PT for vertigo.   She began having severe vertigo severals week ago for no apparent reason. She went to the ED, was given Meclizine , Ativan , and benadryl  and did better.  She was initially so symptomatic that she could not even ambulate.   She is ambulating now with no device, but is quite unsteady and unsafe doing so.  She reports dizziness throughout the day, but worst at night when she rolls over in bed and gets up to go to the bathroom.   She has had an MRI that showed mild white matter disease.  Reports has had short periods of mild vertigo over the yeas that have resolved on their own.  However, this was by far the worst she has ever experienced and was initially completely disabling.   Since onset she is 75% better during the day and  gets better as the day goes on.   At night she is still having a very difficult time and states her symptoms are only 25% improved for nighttime hours.  During the day Quick head movements worsen as well when she is sitting.   She denies any falls but thinks she may have a stress fx in her R foot lateral aspect.  States has had them before, but she hasn't been to get it xrayed.  Pt accompanied by: self  PERTINENT HISTORY: chronic neck and shoulder pain, cervical fusion, asthma, AFIB, angina, HTN, Diabetes  PAIN:  Are you having pain? Yes: NPRS scale: 4/10 sitting; 8/10 worst as day progresses Pain location: R lateral ankle/foot Pain description: aching, sharper as day goes on Aggravating factors: prolonged walking Relieving factors: not walking  PRECAUTIONS: None  RED FLAGS: None   WEIGHT BEARING RESTRICTIONS: No  FALLS: Has patient fallen in last 6 months? No  LIVING ENVIRONMENT: Lives with: lives with their family and lives with their  spouse Lives in: House/apartment Stairs: Yes: External: 3 steps; can reach both Has following equipment at home: Single point cane  PLOF: Independent with gait;  retired from office mgmt work  PATIENT GOALS: not be dizzy, have better balance, be able to get OOB at night normally  OBJECTIVE:  Note: Objective measures were completed at Evaluation unless otherwise noted.  DIAGNOSTIC FINDINGS:   MRI HEAD WITHOUT CONTRAST   TECHNIQUE: Multiplanar, multiecho pulse sequences of the brain and surrounding structures were obtained without intravenous contrast.   COMPARISON:  CT the head dated December 24, 2023.   FINDINGS: Brain: There is no restricted diffusion to indicate acute or recent infarction. There is mild periventricular and deep cerebral white matter disease. There is no evidence of hemorrhage, mass, cortical infarct or hydrocephalus.   Vascular: There are normal vascular flow voids present.   Skull and upper cervical spine: Normal marrow signal. No osseous lesions.   Sinuses/Orbits: Clear paranasal sinuses. Status post bilateral lens replacement.   Other: None.   IMPRESSION: 1. Mild periventricular and deep cerebral white matter disease. No apparent acute process.     Electronically Signed   By: Evalene Coho M.D.   On: 12/24/2023 15:46  COGNITION: Overall cognitive status: Within functional limits for tasks assessed   SENSATION: WFL  EDEMA:  None noted  MUSCLE TONE:  WNL BUE and BLE  DTRs:  NT  POSTURE:  rounded shoulders and forward head  Cervical ROM:    Active A/PROM (deg) eval  Flexion 80%; p!  Extension 60%; some p!  Right lateral flexion 45%  Left lateral flexion 45%  Right rotation 50%  Left rotation 50%  (Blank rows = not tested)  STRENGTH:   UPPER EXTREMITY MMT:  MMT Right eval Left eval  Shoulder flexion 4 4  Shoulder extension    Shoulder abduction 4 4  Shoulder adduction    Shoulder extension    Shoulder internal  rotation    Shoulder external rotation 4 4  Middle trapezius    Lower trapezius    Elbow flexion 5 5  Elbow extension 5 5  Wrist flexion    Wrist extension    Wrist ulnar deviation    Wrist radial deviation    Wrist pronation    Wrist supination    Grip strength 5 5   (Blank rows = not tested)   LOWER EXTREMITY MMT:   MMT Right eval Left eval  Hip flexion    Hip abduction    Hip adduction    Hip internal rotation    Hip external rotation    Knee flexion    Knee extension    Ankle dorsiflexion  Ankle plantarflexion    Ankle inversion    Ankle eversion    (Blank rows = not tested)  BED MOBILITY:  Sit to supine independent Supine to sit independent Rolling to Right independent Rolling to Left independent, but has vertigo symptoms  TRANSFERS: Assistive device utilized: None  Sit to stand: CGA Stand to sit: Complete Independence and CGA Chair to chair: NT Floor: nt  GAIT: Gait pattern: Very slow, guarded, tentative gait with short steps, absolutely head movement or trunk rotation, keeps eyes fixed ahead Distance walked: 200 ft into clinic Assistive device utilized: None Level of assistance: CGA Comments: Gait is unsteady and unsafe without a device and she requires CGA from PT today to maintain her balance in clinic.  She is advised to use at least a cane for now.  If she turns or changes direction she loses balance  FUNCTIONAL TESTS:  TBD  PATIENT SURVEYS:  DHI = 75  VESTIBULAR ASSESSMENT:  GENERAL OBSERVATION: little head or eye movement   SYMPTOM BEHAVIOR:  Subjective history: (see initial note above)  Non-Vestibular symptoms: changes in vision, diplopia, neck pain, and nausea/vomiting  Type of dizziness: Imbalance (Disequilibrium) and feels like falling  Frequency: depends on how much she turns her head  Duration: 1-2 min  Aggravating factors: Induced by position change: lying supine, rolling to the right, rolling to the left, and supine to sit  and Induced by motion: bending down to the ground, turning body quickly, and turning head quickly  Relieving factors: avoiding position changes or head movement  Progression of symptoms: better  OCULOMOTOR EXAM:  Ocular Alignment: normal  Ocular ROM: No Limitations  Spontaneous Nystagmus: absent  Gaze-Induced Nystagmus: left beating with right gaze  Smooth Pursuits: intact  Saccades: intact  Convergence/Divergence: appears normal, but not measured.  Eyes converge and diverge appropriately with testing  FRENZEL - FIXATION SUPRESSED:  Ocular Alignment: normal  Spontaneous Nystagmus: absent  Gaze-Induced Nystagmus: nystagmus is present with gaze induced testing, but difficult to tell direction (very faint and variable)  Horizontal head shaking - induced nystagmus: negative  Vertical head shaking - induced nystagmus: negative  Positional tests: Right Dix-Hallpike: no nystagmus Left Dix-Hallpike: no nystagmus  Pressure tests:   VESTIBULAR - OCULAR REFLEX:   Slow VOR: Comment: impaired with sitting finger following  VOR Cancellation: Comment: NT  Head-Impulse Test: NT  Dynamic Visual Acuity: NT   POSITIONAL TESTING: Right Dix-Hallpike: no nystagmus Left Dix-Hallpike: no nystagmus; but symptomatic when returning to sit from both direction  MOTION SENSITIVITY:  Motion Sensitivity Quotient Intensity: 0 = none, 1 = Lightheaded, 2 = Mild, 3 = Moderate, 4 = Severe, 5 = Vomiting  Intensity  1. Sitting to supine 2  2. Supine to L side 1  3. Supine to R side 3  4. Supine to sitting 4  5. L Hallpike-Dix 1  6. Up from L  1  7. R Hallpike-Dix 1  8. Up from R  4  9. Sitting, head tipped to L knee 0  10. Head up from L knee 0  11. Sitting, head tipped to R knee 0  12. Head up from R knee 0  13. Sitting head turns x5 2  14.Sitting head nods x5 3  15. In stance, 180 turn to L  2  16. In stance, 180 turn to R 2     Total = 26   OTHOSTATICS: not done  FUNCTIONAL GAIT: TBD  TREATMENT DATE:  02/02/24 THERAPEUTIC EXERCISE: To improve strength and ROM.  Demonstration, verbal and tactile cues throughout for technique. UBE L0 x 4' F/2' B  --initially increased pain over the posterior L shoulder/scapular area, but it resolved quickly Seated cervical extension x 10 Seated cervical retraction x 10  THERAPEUTIC ACTIVITIES: To improve functional performance.  Demonstration, verbal and tactile cues throughout for technique.   01/30/24 THERAPEUTIC EXERCISE: To improve strength and ROM.  Demonstration, verbal and tactile cues throughout for technique. UBE L0 x 4' F/2' B  --initially increased pain over the posterior L shoulder/scapular area, but it resolved quickly Seated cervical extension x 10 Seated cervical retraction x 10  THERAPEUTIC ACTIVITIES: To improve functional performance.  Demonstration, verbal and tactile cues throughout for technique. Standing rows YTB x 2/10 BUE Standing shoulder ext YTB x 2/10 LUE, then RUE  NEUROMUSCULAR RE-EDUCATION: To improve kinesthesia, posture, and muscular relaxation reeducation. Standing wall/ball cervical retraction x 2/10 Standing wall/ball scapular retraction w/ cerv retract x 2/10 Supine scapular depression w/ cerv retract x 2/10 BUE  MANUAL THERAPY: To promote reduced pain utilizing manual TP therapy and myofascial release. Deep pressure Tp release to B UT and levator Tps  01/27/24:  Assessed pts cervical spine Rom and strength Ue's  unable to perform the vestibular training with faster head movements due to neck pain, primarily R upper traps. Utilized VOR test, pt with 4 line difference for horizontal and vertical head movement, + for vestibular hypofunction  Attempted stabilization ex for lower cervical spine, upper thoracic , pt with pronounced upper traps region pain with seated yellow t band b shlder ER  stabilizing ball against wall post head, so attempted smaller movements and without ball, still with R upper traps pain.  Pt able to perform supine B shoulder ER with yellow t band without neck pain so to add to her home routine  Manual : spine alt bouts of cervical distraction, R upper traps, levator, suboccipital gentle stretching, with relief of neck pain after this technique.  01/21/24 THERAPEUTIC EXERCISE: To improve strength.  Demonstration, verbal and tactile cues throughout for technique. NuStep L2 x 6'  NEUROMUSCULAR RE-EDUCATION: To improve balance and vestibular retraining. Corner stagger stance with gaze stabilization x 1' head turns x 10; head nods x 10 Corner feet together EC head turns x 10; head nods x 10 SLS w/ contralateral toe touch for balance x 1' BLE Step stance looking R, then looking L x 1' BLE Hallpike dix recheck bilaterally produces no symptoms or nystagmus;  very dizzy on sitting up though CRT x 1 for L ear to see if it would help.   Canalith Repositioning:  Comment: not done due to no symptoms or nystagmus until returning to sit Gaze Adaptation:  x1 Viewing Horizontal: Comment: HEP  Habituation:  Brandt-Daroff: comment: 10 each direction  SELF CARE: Provided education on PT POC progression, to promote safe home environment, and on avoiding quick/sudden head movements.   PATIENT EDUCATION: Education details: HEP review, HEP updated Person educated: Patient Education method: Explanation, Demonstration, Tactile cues, Verbal cues, and Handouts Education comprehension: verbalized understanding, verbal cues required, tactile cues required, and needs further education  HOME EXERCISE PROGRAM: Access Code: [email or web address] URL: [email or web address] Date: 02/02/2024 Prepared by: Garnette Montclair  Exercises - Brandt-Daroff Vestibular Exercise  - 1 x daily - 7 x weekly - 3 sets - 10 reps - Tandem Stance  - 1 x daily - 7 x weekly - 3 sets - 10 reps -  Seated Gaze  Stabilization with Head Rotation  - 1 x daily - 7 x weekly - 3 sets - 10 reps - Seated Gaze Stabilization with Head Nod  - 1 x daily - 7 x weekly - 3 sets - 10 reps - Seated Cervical Extension AROM  - 1 x daily - 7 x weekly - 3 sets - 10 reps - Supine Cervical Retraction with Towel  - 1 x daily - 7 x weekly - 3 sets - 10 reps - Seated Passive Cervical Retraction  - 1 x daily - 7 x weekly - 3 sets - 10 reps - Standing Shoulder Row with Anchored Resistance  - 1 x daily - 7 x weekly - 3 sets - 10 reps - Shoulder extension with resistance - Neutral  - 1 x daily - 7 x weekly - 3 sets - 10 reps - Standing Anatomical Position with Scapular Retraction and Depression at Wall  - 1 x daily - 7 x weekly - 3 sets - 10 reps GOALS: Goals reviewed with patient? Yes  SHORT TERM GOALS: Target date: 02/13/2024  Patient will report 25-50% subjective improvement in symptoms of dizziness Baseline: Motion sensitivity quotient is 26 Goal status: INITIAL  2.  Patient will be independent with initial HEP Baseline: 100% PT assist required for correct completion Goal status: INITIAL  LONG TERM GOALS: Target date: 03/12/24  Patient will improve on DHI to < 40 to indicate decreased subjective symptoms of dizziness, better dizziness control, and improved QOL Baseline: 75/100 Goal status: INITIAL  2.  Patient will ambulate with no device indoors, outdoors, and on steps with good balance independently for return to community Baseline:  Reqires CGA to min A to ambulate in clinic Goal status: INITIAL  3.  Patient will demonstrate DGI score improvement to > 20/24 to decrease falls risk Baseline: TBD Goal status: INITIAL  4.  Patient will be able to ambulate down a corridor with head turns and direction changes with minimal to no c/o dizziness Baseline:  has symptoms with seated head turns and nods Goal status: INITIAL ASSESSMENT:  CLINICAL IMPRESSION: Patient reports   EVAL: Patient is a 67 y.o.   who was seen today for physical therapy evaluation and treatment for vertigo of central origin.   Patient reports symptoms started a couple of weeks ago for no apparent reason and were severely disabling to the point she could not ambulate.   She was started on Meclizine  and is doing better.  Her brain MRI did not show any CVA or other acute abnormalities.  She is still having symptoms when she rolls in bed or gets OOB at night.  Night is her worst time.   Hallpike-Dix testing today did not cause any symptoms or nystagmus.   However, when she rolls to the R or goes from supine to sit she becomes very dizzy.   She does have some nystagmus present with gaze stabilization testing and has dizziness with head movements of nodding worse than turning.  She may have a cupulolithiasis along with some central origin issues, and is given Brandt-Darhoff habituation exercises initially to see if otoconia might dislodge.   She is moderately unsteady with ambulation and needs an assistive device for safety at this time.  Without a device she is a high fall risk presently.   Shalise, has deficits with dizziness, impaired cervical ROM, balance, and safety.   PT is necessary to address each one of these deficits and she is agreeable to the POC  OBJECTIVE IMPAIRMENTS:  Abnormal gait, decreased activity tolerance, decreased balance, difficulty walking, and dizziness.   ACTIVITY LIMITATIONS: lifting, bending, sitting, squatting, sleeping, and locomotion level  PARTICIPATION LIMITATIONS: meal prep, cleaning, laundry, driving, shopping, community activity, and yard work  PERSONAL FACTORS: Age and 1-2 comorbidities: chronic neck and shoulder pain, cervical fusion, asthma, AFIB, angina, HTN, Diabetes are also affecting patient's functional outcome.   REHAB POTENTIAL: Good  CLINICAL DECISION MAKING: Evolving/moderate complexity  EVALUATION COMPLEXITY: Moderate   PLAN:  PT FREQUENCY: 1-2x/week  PT DURATION: 8  weeks  PLANNED INTERVENTIONS: 97164- PT Re-evaluation, 97750- Physical Performance Testing, 97110-Therapeutic exercises, 97530- Therapeutic activity, [id number]- Neuromuscular re-education, 97535- Self Care, [id number]- Manual therapy, [id number]- Gait training, [phone]- Canalith repositioning, H9716- Electrical stimulation (unattended), 97016- Vasopneumatic device, [id number]- Ultrasound, [id number]- Ionotophoresis 4mg /ml Dexamethasone , [id number] (1-2 muscles), [id number] (3+ muscles)- Dry Needling, Patient/Family education, Balance training, Stair training, Taping, Cryotherapy, and Moist heat  PLAN FOR NEXT SESSION: Tp massage, MFR to neck and scapular musculature; scapular strengthening, cervical stabilization; eye tracking.   Will do modalities once orders are back in  [place], PT, DPT, OCS 02/02/2024, 10:24 AM

## 2024-02-04 DIAGNOSIS — G4733 Obstructive sleep apnea (adult) (pediatric): Secondary | ICD-10-CM | POA: Diagnosis not present

## 2024-02-05 ENCOUNTER — Encounter: Admitting: Rehabilitation

## 2024-02-05 LAB — HEMOGLOBIN A1C: Hemoglobin A1C: 6.9

## 2024-02-05 LAB — PROTEIN / CREATININE RATIO, URINE
Albumin, U: 13
Creatinine, Urine: 163

## 2024-02-05 LAB — MICROALBUMIN / CREATININE URINE RATIO: Microalb Creat Ratio: 8

## 2024-02-09 ENCOUNTER — Encounter: Payer: Self-pay | Admitting: Rehabilitation

## 2024-02-09 ENCOUNTER — Ambulatory Visit: Admitting: Rehabilitation

## 2024-02-09 DIAGNOSIS — M6281 Muscle weakness (generalized): Secondary | ICD-10-CM

## 2024-02-09 DIAGNOSIS — M542 Cervicalgia: Secondary | ICD-10-CM

## 2024-02-09 DIAGNOSIS — R42 Dizziness and giddiness: Secondary | ICD-10-CM | POA: Diagnosis not present

## 2024-02-09 DIAGNOSIS — R262 Difficulty in walking, not elsewhere classified: Secondary | ICD-10-CM

## 2024-02-09 NOTE — Therapy (Signed)
 OUTPATIENT PHYSICAL THERAPY TREATMENT / DC SUMMARY     Patient Name: [name] [name] MRN: [id number] DOB:1956/04/03, 68 y.o., female Today's Date: 02/09/2024  END OF SESSION:  PT End of Session - 02/09/24 1410     Visit Number 6    PT Start Time 1401    PT Stop Time 1440    PT Time Calculation (min) 39 min    Activity Tolerance Patient tolerated treatment well;No increased pain    Behavior During Therapy [facility] for tasks assessed/performed            Past Medical History:  Diagnosis Date   Accelerating angina (HCC) 11/04/2017   Allergy     Asthma    Atrial fibrillation (HCC) 08/27/2020   Bone spur 10/11/2021   Cataract    Chronic kidney disease    Chronic pain disorder    neck and shoulder   Chronic pain of right upper extremity 11/10/2017   Coronary artery disease 2019   Stent x2   CTS (carpal tunnel syndrome) 01/27/2013   right   Diabetes mellitus type II    Diabetic gastroparesis (HCC)    Dysrhythmia    A. Fib   Esophageal reflux 08/25/2013   [place], Dr Norvell   FATTY LIVER DISEASE 04/27/2007   Qualifier: Diagnosis of  By: Anice CMA, Darlene     Focal muscle atrophy 09/26/2015   Fundic gland polyps of stomach, benign    Globus sensation 04/13/2018   Hordeolum externum of right upper eyelid 12/24/2021   HTN (hypertension)    Hx of adenomatous colonic polyps 12/10/2021   Hyperlipidemia, mixed 05/04/2007   Qualifier: Diagnosis of  By: Georgian ROSALEA CHARM Lamar crestor  caused myalgias even at low dose Livalo  caused myalgias Lipitor  Mother with severe reaction myalgias Simvastatin, Welchol     Hypokalemia 05/25/2013   Improved stopping HCTZ. Was noted to have an elevated glucose when K was low. Recheck renal next week after starting Maxzide   Hypomagnesemia 08/10/2014   Hypothyroidism    Iron  deficiency anemia 08/07/2018   Iron  malabsorption 08/07/2018   Laryngitis 08/25/2017   Leg cramps 04/15/2016   Nausea without vomiting 08/08/2014   NONSPEC  ELEVATION OF LEVELS OF TRANSAMINASE/LDH 05/01/2007   Qualifier: Diagnosis of  By: Eyvonne Jacob     Osteoarthritis    chronic, right knee (11/04/2017)   Plantar fasciitis    Pneumonia    Rosacea 10/22/2016   Urine incontinence    Past Surgical History:  Procedure Laterality Date   ANTERIOR CERVICAL DECOMP/DISCECTOMY FUSION N/A 11/25/2022   Procedure: ANTERIOR CERVICAL DISCECTOMY AND FUSION CERVICAL FIVE TO SEVEN;  Surgeon: Burnetta Aures, MD;  Location: [facility] OR;  Service: Orthopedics;  Laterality: N/A;   AUGMENTATION MAMMAPLASTY Bilateral 2002   BLADDER SUSPENSION  1981   CATARACT EXTRACTION W/ INTRAOCULAR LENS IMPLANT Bilateral    COLONOSCOPY     CORONARY PRESSURE/FFR STUDY N/A 11/04/2017   Procedure: INTRAVASCULAR PRESSURE WIRE/FFR STUDY;  Surgeon: Mady Bruckner, MD;  Location: [facility] INVASIVE CV LAB;  Service: Cardiovascular;  Laterality: N/A;   CORONARY STENT INTERVENTION N/A 11/04/2017   Procedure: CORONARY STENT INTERVENTION;  Surgeon: Mady Bruckner, MD;  Location: [facility] INVASIVE CV LAB;  Service: Cardiovascular;  Laterality: N/A;   CORONARY ULTRASOUND/IVUS N/A 11/04/2017   Procedure: Intravascular Ultrasound/IVUS;  Surgeon: Mady Bruckner, MD;  Location: [facility] INVASIVE CV LAB;  Service: Cardiovascular;  Laterality: N/A;   ENDOVENOUS ABLATION SAPHENOUS VEIN W/ LASER Left 12/29/2019   endovenous laser ablation left greater saphenous vein by Carlin  Fields MD    ESOPHAGOGASTRODUODENOSCOPY     KNEE ARTHROSCOPY Right 1998, [id number]   LAPAROSCOPIC CHOLECYSTECTOMY  2007   LEFT HEART CATH AND CORONARY ANGIOGRAPHY N/A 11/04/2017   Procedure: LEFT HEART CATH AND CORONARY ANGIOGRAPHY;  Surgeon: Mady Bruckner, MD;  Location: [facility] INVASIVE CV LAB;  Service: Cardiovascular;  Laterality: N/A;   LEFT HEART CATHETERIZATION WITH CORONARY ANGIOGRAM N/A 11/21/2011   Procedure: LEFT HEART CATHETERIZATION WITH CORONARY ANGIOGRAM;  Surgeon: Maude JAYSON Emmer, MD;  Location: [facility] CATH LAB;  Service:  Cardiovascular;  Laterality: N/A;   TONSILLECTOMY  1975   VAGINAL HYSTERECTOMY  1983   still have my ovaries   Patient Active Problem List   Diagnosis Date Noted   Persistent vertigo-unknown origin 01/01/2024   Gait instability 01/01/2024   Low calcium  levels 01/01/2024   White matter disease, unspecified-mild periventricular and deep cerebral 01/01/2024   Hypomagnesemia 11/26/2022   S/P cervical spinal fusion 11/25/2022   Hx of adenomatous colonic polyps 12/10/2021   Aortic atherosclerosis (HCC) 10/30/2021   Thoracic radiculopathy 10/26/2021   Paresthesia 10/26/2021   Right-sided thoracic back pain 10/26/2021   Neurosensory deficit 10/26/2021   Thoracic degenerative disc disease 10/11/2021   Dextroscoliosis of thoracic spine 10/11/2021   PAF (paroxysmal atrial fibrillation) (HCC) 07/30/2021   Hyperlipidemia associated with type 2 diabetes mellitus (HCC) 01/01/2021   Acquired thrombophilia (HCC) 09/07/2020   Peripheral vascular disease (HCC) 09/01/2020   Statin not tolerated 09/01/2020   Unstable angina (HCC) 08/26/2020   Varicose veins of both lower extremities with pain 06/15/2019   Iron  deficiency anemia 08/07/2018   Iron  malabsorption 08/07/2018   CAD S/P percutaneous coronary angioplasty 11/12/2017   Abnormal cardiac CT angiography 11/04/2017   Diabetic gastroparesis (HCC) 07/19/2014   Esophageal reflux 08/25/2013   Essential hypertension 08/24/2010   Hyperlipidemia LDL goal <70 05/04/2007   Hypothyroidism 04/27/2007   Hepatic steatosis 04/27/2007    PCP: Dr. Charlies Bellini  REFERRING PROVIDER: Dr. Charlies Bellini  REFERRING DIAG: H81.4 (ICD-10-CM) - Persistent vertigo of central origin  THERAPY DIAG:  Dizziness and giddiness  Difficulty in walking, not elsewhere classified  Muscle weakness (generalized)  Cervicalgia  ONSET DATE: 3 weeks ago  Rationale for Evaluation and Treatment: Rehabilitation  SUBJECTIVE:   SUBJECTIVE STATEMENT:    EVAL: 68 y/o  female referred to PT for vertigo.   She began having severe vertigo severals week ago for no apparent reason. She went to the ED, was given Meclizine , Ativan , and benadryl  and did better.  She was initially so symptomatic that she could not even ambulate.   She is ambulating now with no device, but is quite unsteady and unsafe doing so.  She reports dizziness throughout the day, but worst at night when she rolls over in bed and gets up to go to the bathroom.   She has had an MRI that showed mild white matter disease.  Reports has had short periods of mild vertigo over the yeas that have resolved on their own.  However, this was by far the worst she has ever experienced and was initially completely disabling.   Since onset she is 75% better during the day and  gets better as the day goes on.   At night she is still having a very difficult time and states her symptoms are only 25% improved for nighttime hours. During the day Quick head movements worsen as well when she is sitting.   She denies any falls but thinks she may have a stress fx in  her R foot lateral aspect.  States has had them before, but she hasn't been to get it xrayed.  Pt accompanied by: self  PERTINENT HISTORY: chronic neck and shoulder pain, cervical fusion, asthma, AFIB, angina, HTN, Diabetes  PAIN:  Are you having pain? Yes: NPRS scale: 4/10 sitting; 8/10 worst as day progresses Pain location: R lateral ankle/foot Pain description: aching, sharper as day goes on Aggravating factors: prolonged walking Relieving factors: not walking  PRECAUTIONS: None  RED FLAGS: None   WEIGHT BEARING RESTRICTIONS: No  FALLS: Has patient fallen in last 6 months? No  LIVING ENVIRONMENT: Lives with: lives with their family and lives with their spouse Lives in: House/apartment Stairs: Yes: External: 3 steps; can reach both Has following equipment at home: Single point cane  PLOF: Independent with gait;  retired from office mgmt  work  PATIENT GOALS: not be dizzy, have better balance, be able to get OOB at night normally  OBJECTIVE:  Note: Objective measures were completed at Evaluation unless otherwise noted.  DIAGNOSTIC FINDINGS:   MRI HEAD WITHOUT CONTRAST   TECHNIQUE: Multiplanar, multiecho pulse sequences of the brain and surrounding structures were obtained without intravenous contrast.   COMPARISON:  CT the head dated December 24, 2023.   FINDINGS: Brain: There is no restricted diffusion to indicate acute or recent infarction. There is mild periventricular and deep cerebral white matter disease. There is no evidence of hemorrhage, mass, cortical infarct or hydrocephalus.   Vascular: There are normal vascular flow voids present.   Skull and upper cervical spine: Normal marrow signal. No osseous lesions.   Sinuses/Orbits: Clear paranasal sinuses. Status post bilateral lens replacement.   Other: None.   IMPRESSION: 1. Mild periventricular and deep cerebral white matter disease. No apparent acute process.     Electronically Signed   By: Evalene Coho M.D.   On: 12/24/2023 15:46  COGNITION: Overall cognitive status: Within functional limits for tasks assessed   SENSATION: WFL  EDEMA:  None noted  MUSCLE TONE:  WNL BUE and BLE  DTRs:  NT  POSTURE:  rounded shoulders and forward head  Cervical ROM:    Active A/PROM (deg) eval  Flexion 80%; p!  Extension 60%; some p!  Right lateral flexion 45%  Left lateral flexion 45%  Right rotation 50%  Left rotation 50%  (Blank rows = not tested)  STRENGTH:   UPPER EXTREMITY MMT:  MMT Right eval Left eval  Shoulder flexion 4 4  Shoulder extension    Shoulder abduction 4 4  Shoulder adduction    Shoulder extension    Shoulder internal rotation    Shoulder external rotation 4 4  Middle trapezius    Lower trapezius    Elbow flexion 5 5  Elbow extension 5 5  Wrist flexion    Wrist extension    Wrist ulnar deviation     Wrist radial deviation    Wrist pronation    Wrist supination    Grip strength 5 5   (Blank rows = not tested)   LOWER EXTREMITY MMT:   MMT Right eval Left eval  Hip flexion    Hip abduction    Hip adduction    Hip internal rotation    Hip external rotation    Knee flexion    Knee extension    Ankle dorsiflexion    Ankle plantarflexion    Ankle inversion    Ankle eversion    (Blank rows = not tested)  BED MOBILITY:  Sit to  supine independent Supine to sit independent Rolling to Right independent Rolling to Left independent, but has vertigo symptoms  TRANSFERS: Assistive device utilized: None  Sit to stand: CGA Stand to sit: Complete Independence and CGA Chair to chair: NT Floor: nt  GAIT: Gait pattern: Very slow, guarded, tentative gait with short steps, absolutely head movement or trunk rotation, keeps eyes fixed ahead Distance walked: 200 ft into clinic Assistive device utilized: None Level of assistance: CGA Comments: Gait is unsteady and unsafe without a device and she requires CGA from PT today to maintain her balance in clinic.  She is advised to use at least a cane for now.  If she turns or changes direction she loses balance  FUNCTIONAL TESTS:  TBD  PATIENT SURVEYS:  DHI = 75  VESTIBULAR ASSESSMENT:  GENERAL OBSERVATION: little head or eye movement   SYMPTOM BEHAVIOR:  Subjective history: (see initial note above)  Non-Vestibular symptoms: changes in vision, diplopia, neck pain, and nausea/vomiting  Type of dizziness: Imbalance (Disequilibrium) and feels like falling  Frequency: depends on how much she turns her head  Duration: 1-2 min  Aggravating factors: Induced by position change: lying supine, rolling to the right, rolling to the left, and supine to sit and Induced by motion: bending down to the ground, turning body quickly, and turning head quickly  Relieving factors: avoiding position changes or head movement  Progression of symptoms:  better  OCULOMOTOR EXAM:  Ocular Alignment: normal  Ocular ROM: No Limitations  Spontaneous Nystagmus: absent  Gaze-Induced Nystagmus: left beating with right gaze  Smooth Pursuits: intact  Saccades: intact  Convergence/Divergence: appears normal, but not measured.  Eyes converge and diverge appropriately with testing  FRENZEL - FIXATION SUPRESSED:  Ocular Alignment: normal  Spontaneous Nystagmus: absent  Gaze-Induced Nystagmus: nystagmus is present with gaze induced testing, but difficult to tell direction (very faint and variable)  Horizontal head shaking - induced nystagmus: negative  Vertical head shaking - induced nystagmus: negative  Positional tests: Right Dix-Hallpike: no nystagmus Left Dix-Hallpike: no nystagmus  Pressure tests:   VESTIBULAR - OCULAR REFLEX:   Slow VOR: Comment: impaired with sitting finger following  VOR Cancellation: Comment: NT  Head-Impulse Test: NT  Dynamic Visual Acuity: NT   POSITIONAL TESTING: Right Dix-Hallpike: no nystagmus Left Dix-Hallpike: no nystagmus; but symptomatic when returning to sit from both direction  MOTION SENSITIVITY:  Motion Sensitivity Quotient Intensity: 0 = none, 1 = Lightheaded, 2 = Mild, 3 = Moderate, 4 = Severe, 5 = Vomiting  Intensity  1. Sitting to supine 2  2. Supine to L side 1  3. Supine to R side 3  4. Supine to sitting 4  5. L Hallpike-Dix 1  6. Up from L  1  7. R Hallpike-Dix 1  8. Up from R  4  9. Sitting, head tipped to L knee 0  10. Head up from L knee 0  11. Sitting, head tipped to R knee 0  12. Head up from R knee 0  13. Sitting head turns x5 2  14.Sitting head nods x5 3  15. In stance, 180 turn to L  2  16. In stance, 180 turn to R 2     Total = 26   OTHOSTATICS: not done  FUNCTIONAL GAIT: TBD  TREATMENT DATE:  02/09/24 THERAPEUTIC EXERCISE: To improve strength  and ROM.  Demonstration, verbal and tactile cues throughout for technique. UBE L0 x 3'F/3' B   THERAPEUTIC ACTIVITIES: To improve functional performance.  Demonstration, verbal and tactile cues throughout for technique. Standing rowing RTB x 20 BUE Standing shoulder ext RTB x 20 BUE Doorway shoulder ER YTB x 20 BUE Doorway shoulder HABD YTB x 20 BUE Doorway shoulder D2 PNF extension x 10 BUE  PHYSICAL PERFORMANCE TEST or MEASUREMENT:   [facility] PT Assessment - 02/09/24 0001       Standardized Balance Assessment   Standardized Balance Assessment Dynamic Gait Index      Dynamic Gait Index   Level Surface Normal    Change in Gait Speed Normal    Gait with Horizontal Head Turns Mild Impairment    Gait with Vertical Head Turns Normal    Gait and Pivot Turn Mild Impairment    Step Over Obstacle Normal    Step Around Obstacles Normal    Steps Mild Impairment    Total Score 21      Functional Gait  Assessment   Gait assessed  Yes            02/02/24 THERAPEUTIC EXERCISE: To improve strength and ROM.  Demonstration, verbal and tactile cues throughout for technique. UBE L0 x 4' F/2' B  --initially increased pain over the posterior L shoulder/scapular area, but it resolved quickly Seated cervical extension x 10 Seated cervical retraction x 10  MANUAL THERAPY: To promote reduced pain utilizing manual TP therapy and myofascial release. Deep pressure Tp massage UT BUE, suboccipital release, scalene MFR; STM UT  HP/premod estim to B UT x 15' at machine default settings x 5-8 mA  01/30/24 THERAPEUTIC EXERCISE: To improve strength and ROM.  Demonstration, verbal and tactile cues throughout for technique. UBE L0 x 4' F/2' B  --initially increased pain over the posterior L shoulder/scapular area, but it resolved quickly Seated cervical extension x 10 Seated cervical retraction x 10  THERAPEUTIC ACTIVITIES: To improve functional performance.  Demonstration, verbal and tactile cues  throughout for technique. Standing rows YTB x 2/10 BUE Standing shoulder ext YTB x 2/10 LUE, then RUE  NEUROMUSCULAR RE-EDUCATION: To improve kinesthesia, posture, and muscular relaxation reeducation. Standing wall/ball cervical retraction x 2/10 Standing wall/ball scapular retraction w/ cerv retract x 2/10 Supine scapular depression w/ cerv retract x 2/10 BUE  MANUAL THERAPY: To promote reduced pain utilizing manual TP therapy and myofascial release. Deep pressure Tp release to B UT and levator Tps  01/27/24:  Assessed pts cervical spine Rom and strength Ue's  unable to perform the vestibular training with faster head movements due to neck pain, primarily R upper traps. Utilized VOR test, pt with 4 line difference for horizontal and vertical head movement, + for vestibular hypofunction  Attempted stabilization ex for lower cervical spine, upper thoracic , pt with pronounced upper traps region pain with seated yellow t band b shlder ER stabilizing ball against wall post head, so attempted smaller movements and without ball, still with R upper traps pain.  Pt able to perform supine B shoulder ER with yellow t band without neck pain so to add to her home routine  Manual : spine alt bouts of cervical distraction, R upper traps, levator, suboccipital gentle stretching, with relief of neck pain after this technique.  01/21/24 THERAPEUTIC EXERCISE: To improve strength.  Demonstration, verbal and tactile cues throughout for technique. NuStep L2 x 6'  NEUROMUSCULAR RE-EDUCATION:  To improve balance and vestibular retraining. Corner stagger stance with gaze stabilization x 1' head turns x 10; head nods x 10 Corner feet together EC head turns x 10; head nods x 10 SLS w/ contralateral toe touch for balance x 1' BLE Step stance looking R, then looking L x 1' BLE Hallpike dix recheck bilaterally produces no symptoms or nystagmus;  very dizzy on sitting up though CRT x 1 for L ear to see if it would  help.   Canalith Repositioning:  Comment: not done due to no symptoms or nystagmus until returning to sit Gaze Adaptation:  x1 Viewing Horizontal: Comment: HEP  Habituation:  Brandt-Daroff: comment: 10 each direction  SELF CARE: Provided education on PT POC progression, to promote safe home environment, and on avoiding quick/sudden head movements.   PATIENT EDUCATION: Education details: HEP review, HEP updated Person educated: Patient Education method: Explanation, Demonstration, Tactile cues, Verbal cues, and Handouts Education comprehension: verbalized understanding, verbal cues required, tactile cues required, and needs further education  HOME EXERCISE PROGRAM: Access Code: [email or web address] URL: [email or web address] Date: 02/02/2024 Prepared by: Garnette Montclair  Exercises - Brandt-Daroff Vestibular Exercise  - 1 x daily - 7 x weekly - 3 sets - 10 reps - Tandem Stance  - 1 x daily - 7 x weekly - 3 sets - 10 reps - Seated Gaze Stabilization with Head Rotation  - 1 x daily - 7 x weekly - 3 sets - 10 reps - Seated Gaze Stabilization with Head Nod  - 1 x daily - 7 x weekly - 3 sets - 10 reps - Seated Cervical Extension AROM  - 1 x daily - 7 x weekly - 3 sets - 10 reps - Supine Cervical Retraction with Towel  - 1 x daily - 7 x weekly - 3 sets - 10 reps - Seated Passive Cervical Retraction  - 1 x daily - 7 x weekly - 3 sets - 10 reps - Standing Shoulder Row with Anchored Resistance  - 1 x daily - 7 x weekly - 3 sets - 10 reps - Shoulder extension with resistance - Neutral  - 1 x daily - 7 x weekly - 3 sets - 10 reps - Standing Anatomical Position with Scapular Retraction and Depression at Wall  - 1 x daily - 7 x weekly - 3 sets - 10 reps GOALS: Goals reviewed with patient? Yes  SHORT TERM GOALS: Target date: 02/13/2024  Patient will report 25-50% subjective improvement in symptoms of dizziness Baseline: Motion sensitivity quotient is 26 Goal status: Not tested on last  day  2.  Patient will be independent with initial HEP Baseline: 100% PT assist required for correct completion 02/09/24:  independent Goal status: MET  LONG TERM GOALS: Target date: 03/12/24  Patient will improve on DHI to < 40 to indicate decreased subjective symptoms of dizziness, better dizziness control, and improved QOL Baseline: 75/100 02/09/24:  14/100 Goal status: MET  2.  Patient will ambulate with no device indoors, outdoors, and on steps with good balance independently for return to community Baseline:  Reqires CGA to min A to ambulate in clinic 02/09/24:  independent ambulation on all surfaces, but declines steps due to knee pain Goal status: MET  3.  Patient will demonstrate DGI score improvement to > 20/24 to decrease falls risk Baseline: TBD Goal status: MET 02/09/24:  21/24   4.  Patient will be able to ambulate down a corridor with head turns and direction changes with minimal to no c/o  dizziness Baseline:  has symptoms with seated head turns and nods 02/09/24:  patient is able to ambulate 300 feet down our hallway with head turns looking at wall art with no dizziness Goal status: MET ASSESSMENT:  CLINICAL IMPRESSION: Patient states feels she is at a good place and wants to stop PT to allow others to come in who may need therapy worse than her.   She is advised that she should not look at it this way but rather come until her symptoms are resolved.   She feels symptoms are well controlled and would like to continue with her home exercises and call us  PRN.  She has made good overall improvement.   Her dizziness is much better, but she still has some nights where she awakens with dizziness.  She also has dizziness with turning around quickly.   Her balance is much better and she is ambulating independently.  Her Dynamic Gait Index score is 21/24 demonstrating decreased fall risk.   Dizziness Handicapped Inventory has improved from 75 to 14 which is markedly better.   We will  D/C PT  per her request, but she is encouraged to return to us  if needed or call with any further questions.   EVAL: Patient is a 68 y.o.  who was seen today for physical therapy evaluation and treatment for vertigo of central origin.   Patient reports symptoms started a couple of weeks ago for no apparent reason and were severely disabling to the point she could not ambulate.   She was started on Meclizine  and is doing better.  Her brain MRI did not show any CVA or other acute abnormalities.  She is still having symptoms when she rolls in bed or gets OOB at night.  Night is her worst time.   Hallpike-Dix testing today did not cause any symptoms or nystagmus.   However, when she rolls to the R or goes from supine to sit she becomes very dizzy.   She does have some nystagmus present with gaze stabilization testing and has dizziness with head movements of nodding worse than turning.  She may have a cupulolithiasis along with some central origin issues, and is given Brandt-Darhoff habituation exercises initially to see if otoconia might dislodge.   She is moderately unsteady with ambulation and needs an assistive device for safety at this time.  Without a device she is a high fall risk presently.   Grasiela, has deficits with dizziness, impaired cervical ROM, balance, and safety.   PT is necessary to address each one of these deficits and she is agreeable to the POC  OBJECTIVE IMPAIRMENTS: Abnormal gait, decreased activity tolerance, decreased balance, difficulty walking, and dizziness.   ACTIVITY LIMITATIONS: lifting, bending, sitting, squatting, sleeping, and locomotion level  PARTICIPATION LIMITATIONS: meal prep, cleaning, laundry, driving, shopping, community activity, and yard work  PERSONAL FACTORS: Age and 1-2 comorbidities: chronic neck and shoulder pain, cervical fusion, asthma, AFIB, angina, HTN, Diabetes are also affecting patient's functional outcome.   REHAB POTENTIAL: Good  CLINICAL DECISION  MAKING: Evolving/moderate complexity  EVALUATION COMPLEXITY: Moderate   PLAN:  PT FREQUENCY: 1-2x/week  PT DURATION: 8 weeks  PLANNED INTERVENTIONS: 97164- PT Re-evaluation, 97750- Physical Performance Testing, 97110-Therapeutic exercises, 97530- Therapeutic activity, [id number]- Neuromuscular re-education, 97535- Self Care, [id number]- Manual therapy, [id number]- Gait training, [phone]- Canalith repositioning, H9716- Electrical stimulation (unattended), 97016- Vasopneumatic device, [id number]- Ultrasound, [id number]- Ionotophoresis 4mg /ml Dexamethasone , [id number] (1-2 muscles), 20561 (3+ muscles)- Dry Needling, Patient/Family education, Balance training, Stair training, Taping, Cryotherapy, and Moist  heat  PLAN FOR NEXT SESSION: D/C PER PATIENT REQUEST  PHYSICAL THERAPY DISCHARGE SUMMARY  Visits from Start of Care: 6  Current functional level related to goals / functional outcomes:  Her balance is much better and she is ambulating independently.  Her Dynamic Gait Index score is 21/24 demonstrating decreased fall risk.   Dizziness Handicapped Inventory has improved from 75 to 14 which is markedly better.   We will D/C PT  per her request, but she is encouraged to return to us  if needed or call with any further questions.    Remaining deficits: Dizziness with turning around quickly and at times when getting OOB at night   Education / Equipment: Patient is independent with all home exercises and advised to continue daily as tolerated and call us  with any questions   Patient agrees to discharge. Patient goals were met. Patient is being discharged due to the patient's request.   Nyaja Dubuque, PT 02/09/2024, 4:55 PM

## 2024-02-11 ENCOUNTER — Ambulatory Visit (INDEPENDENT_AMBULATORY_CARE_PROVIDER_SITE_OTHER): Admitting: Family Medicine

## 2024-02-11 ENCOUNTER — Encounter: Payer: Self-pay | Admitting: Family Medicine

## 2024-02-11 VITALS — BP 124/82 | HR 74 | Temp 97.9°F | Wt 182.8 lb

## 2024-02-11 DIAGNOSIS — I200 Unstable angina: Secondary | ICD-10-CM

## 2024-02-11 DIAGNOSIS — M8580 Other specified disorders of bone density and structure, unspecified site: Secondary | ICD-10-CM | POA: Insufficient documentation

## 2024-02-11 DIAGNOSIS — R9082 White matter disease, unspecified: Secondary | ICD-10-CM

## 2024-02-11 DIAGNOSIS — M5134 Other intervertebral disc degeneration, thoracic region: Secondary | ICD-10-CM

## 2024-02-11 DIAGNOSIS — E1169 Type 2 diabetes mellitus with other specified complication: Secondary | ICD-10-CM

## 2024-02-11 DIAGNOSIS — D6869 Other thrombophilia: Secondary | ICD-10-CM

## 2024-02-11 DIAGNOSIS — E785 Hyperlipidemia, unspecified: Secondary | ICD-10-CM

## 2024-02-11 DIAGNOSIS — Z23 Encounter for immunization: Secondary | ICD-10-CM | POA: Diagnosis not present

## 2024-02-11 DIAGNOSIS — I2510 Atherosclerotic heart disease of native coronary artery without angina pectoris: Secondary | ICD-10-CM

## 2024-02-11 DIAGNOSIS — I10 Essential (primary) hypertension: Secondary | ICD-10-CM | POA: Diagnosis not present

## 2024-02-11 DIAGNOSIS — I480 Paroxysmal atrial fibrillation: Secondary | ICD-10-CM

## 2024-02-11 DIAGNOSIS — M5414 Radiculopathy, thoracic region: Secondary | ICD-10-CM

## 2024-02-11 DIAGNOSIS — M546 Pain in thoracic spine: Secondary | ICD-10-CM

## 2024-02-11 DIAGNOSIS — H814 Vertigo of central origin: Secondary | ICD-10-CM

## 2024-02-11 DIAGNOSIS — Z1231 Encounter for screening mammogram for malignant neoplasm of breast: Secondary | ICD-10-CM

## 2024-02-11 MED ORDER — VALSARTAN 40 MG PO TABS: 40.0000 mg | ORAL_TABLET | Freq: Every day | ORAL | 1 refills | Status: DC

## 2024-02-11 MED ORDER — PREGABALIN 50 MG PO CAPS: 50.0000 mg | ORAL_CAPSULE | Freq: Two times a day (BID) | ORAL | 1 refills | Status: DC

## 2024-02-11 NOTE — Patient Instructions (Signed)
 Return in about 24 weeks (around 07/28/2024) for Routine chronic condition follow-up.        Great to see you today.  I have refilled the medication(s) we provide.   If labs were collected or images ordered, we will inform you of  results once we have received them and reviewed. We will contact you either by echart message, or telephone call.  Please give ample time to the testing facility, and our office to run,  receive and review results. Please do not call inquiring of results, even if you can see them in your chart. We will contact you as soon as we are able. If it has been over 1 week since the test was completed, and you have not yet heard from us , then please call us .    - echart message- for normal results that have been seen by the patient already.   - telephone call: abnormal results or if patient has not viewed results in their echart.  If a referral to a specialist was entered for you, please call us  in 2 weeks if you have not heard from the specialist office to schedule.

## 2024-02-11 NOTE — Progress Notes (Signed)
 [name] [name] , 04-05-1956, 68 y.o., female MRN: [id number] Patient Care Team    Relationship Specialty Notifications Start End  Catherine Charlies LABOR, DO PCP - General Family Medicine  06/03/18   Lavona Agent, MD PCP - Cardiology Cardiology Admissions 11/14/17   Avram Lupita BRAVO, MD Consulting Physician Gastroenterology  06/05/18   Mannam, Praveen, MD Consulting Physician Pulmonary Disease  06/05/18   Tommas Pears, MD Referring Physician Endocrinology  09/07/20   Duwayne Purchase, MD Consulting Physician Orthopedic Surgery  10/31/21   Berniece Reena HERO, RN (Inactive) Registered Nurse   02/27/22     Chief Complaint  Patient presents with   Back Pain   Osteoarthritis    Chronic Conditions/illness Management Influenza vaccine Mammogram- offer mam bus or [place] (prior lx)   Hypertension     Subjective: [name] [name] is a 68 y.o. Pt presents for an OV for Chronic Conditions/illness Management  Back pain: Patient reports compliance with Lyrica  50 mg twice daily.Pt reports lyrica  has helped her condition greatly.  CT: 10/29/2021-Disc levels: Disc heights are relatively preserved. Scattered mild anterior endplate spurring throughout the thoracic spine. No high-grade spinal canal or neuroforaminal stenosis.  Patient was referred to orthopedics. Prior note Patient was seen a little over 2 weeks ago for back pain of her thoracic spine that had started to worsen.  She reports the thoracic back pain had been present since November 2022.  She had started seeing a chiropractor routinely for the discomfort, but has not seen much change that is positive.  She noticed approximately 3 weeks ago she is having more increasing pain, pain over the thoracic spine itself and referred pain to her right axilla.  She also endorses some decreased sensation under her right axilla, that she now asked to watch when she is shaving underneath her arm because she cannot feel the razor. Tx: medrol  dose pack, baclofen .   She reports the medications helped temporarily, but as soon as the steroid Dosepak was completed her pain returned very quickly. There has been no known injury or overactivity that created discomfort. NSAIDs are contraindicated with chronic anticoagulation.    Vertigo: Slowly improving.  Unable to perform vestibular rehab secondary to prior neck surgery. Prior note  rather severe vertigo experienced approximately 2-3 weeks ago.  See prior note below for full details. Today patient reports she has noticed some improvement with her vertigo.  Balance has improved as well.  She is no longer requiring the assistance of a cane.  She reports the vertigo is still present mostly in the nighttime and in the early morning hours. Patient has neurology referral in place, they were unable to get her in until October.  Labs completed at last visit did not show cause of vertigo symptoms iron  on the low end of normal, A1c mildly elevated at 7.4 and diabetic, vitamin D  normal electrolytes normal Patient reports the vertigo has been persistent for over 2 weeks.  She is unsteady in her gait and now needing to walk with a cane and have her husband assist her.  She reports vertigo is present persistently throughout the day.  She will have minimal relief usually after dinner around 8 PM until bedtime.  However she wakes in the middle of the night with vertigo symptoms. She denies any recent illnesses. She denies any chest pain, palpitations, headaches or shortness of breath. She did receive her first injection of leqivo from 7/21 with ED visit on 7/30.  She reports the  symptoms did start around the time of her injection.   Hypertension: Patient was having mildly elevated blood pressures, which was new for her.  Started Diovan  40 mg last visit patient reports compliance. Patient denies chest pain, shortness of breath, dizziness or lower extremity edema.   MRI 12/24/2023 IMPRESSION: 1. Mild periventricular and deep  cerebral white matter disease. No apparent acute process. EKG Interpretation Date/Time:                  Wednesday December 24 2023 12:57:34 EDT Ventricular Rate:         58 PR Interval:                 182 QRS Duration:             96 QT Interval:                 474 QTC Calculation:466 R Axis:                         67   Text Interpretation:Sinus rhythm Low voltage, precordial leads Borderline T abnormalities, anterior leads No significant change since last tracing Confirmed by Doretha Folks ([id number]) on 12/24/2023 1:29:41 PM    12/30/2023    8:16 AM 04/16/2023    3:43 PM 04/11/2022    2:16 PM 04/04/2021   11:48 AM 09/06/2020   11:25 AM  Depression screen PHQ 2/9  Decreased Interest 2 0 0 0 0  Down, Depressed, Hopeless 3 0 0 0 0  PHQ - 2 Score 5 0 0 0 0  Altered sleeping 0 0     Tired, decreased energy 3 0     Change in appetite 0 0     Feeling bad or failure about yourself  0 0     Trouble concentrating 3 0     Moving slowly or fidgety/restless 0 0     Suicidal thoughts 0 0     PHQ-9 Score 11 0     Difficult doing work/chores Somewhat difficult Not difficult at all       Allergies  Allergen Reactions   Epinephrine  Other (See Comments)    Pass out    Other     Other reaction(s): Myalgia   Statins Other (See Comments)    Myalgias: Simvastatin also caused back ache   Welchol  [Colesevelam  Hcl]     myalgia   Amoxicillin Itching and Rash    Has patient had a PCN reaction causing immediate rash, facial/tongue/throat swelling, SOB or lightheadedness with hypotension: No Has patient had a PCN reaction causing severe rash involving mucus membranes or skin necrosis: No Has patient had a PCN reaction that required hospitalization: No Has patient had a PCN reaction occurring within the last 10 years: Yes If all of the above answers are NO, then may proceed with Cephalosporin use.    Social History   Social History Narrative   Marital status/children/pets: Married, 1 child.     Education/employment: HS   Safety:      -smoke alarm in the home:Yes     - wears seatbelt: Yes     - Feels safe in their relationships: Yes   Past Medical History:  Diagnosis Date   Accelerating angina (HCC) 11/04/2017   Allergy     Asthma    Atrial fibrillation (HCC) 08/27/2020   Bone spur 10/11/2021   Cataract    Chronic kidney disease    Chronic pain disorder  neck and shoulder   Chronic pain of right upper extremity 11/10/2017   Coronary artery disease 2019   Stent x2   CTS (carpal tunnel syndrome) 01/27/2013   right   Dextroscoliosis of thoracic spine 10/11/2021   Diabetes mellitus type II    Diabetic gastroparesis (HCC)    Dysrhythmia    A. Fib   Esophageal reflux 08/25/2013   [place], Dr Norvell   FATTY LIVER DISEASE 04/27/2007   Qualifier: Diagnosis of  By: Anice CMA, Darlene     Focal muscle atrophy 09/26/2015   Fundic gland polyps of stomach, benign    Globus sensation 04/13/2018   Hordeolum externum of right upper eyelid 12/24/2021   HTN (hypertension)    Hx of adenomatous colonic polyps 12/10/2021   Hyperlipidemia, mixed 05/04/2007   Qualifier: Diagnosis of  By: Georgian ROSALEA CHARM Lamar crestor  caused myalgias even at low dose Livalo  caused myalgias Lipitor  Mother with severe reaction myalgias Simvastatin, Welchol     Hypokalemia 05/25/2013   Improved stopping HCTZ. Was noted to have an elevated glucose when K was low. Recheck renal next week after starting Maxzide   Hypomagnesemia 08/10/2014   Hypothyroidism    Iron  deficiency anemia 08/07/2018   Iron  malabsorption 08/07/2018   Laryngitis 08/25/2017   Leg cramps 04/15/2016   Nausea without vomiting 08/08/2014   Neurosensory deficit 10/26/2021   NONSPEC ELEVATION OF LEVELS OF TRANSAMINASE/LDH 05/01/2007   Qualifier: Diagnosis of  By: Eyvonne Jacob     Osteoarthritis    chronic, right knee (11/04/2017)   Plantar fasciitis    Pneumonia    Rosacea 10/22/2016   Urine incontinence    Past  Surgical History:  Procedure Laterality Date   ANTERIOR CERVICAL DECOMP/DISCECTOMY FUSION N/A 11/25/2022   Procedure: ANTERIOR CERVICAL DISCECTOMY AND FUSION CERVICAL FIVE TO SEVEN;  Surgeon: Burnetta Aures, MD;  Location: [facility] OR;  Service: Orthopedics;  Laterality: N/A;   AUGMENTATION MAMMAPLASTY Bilateral 2002   BLADDER SUSPENSION  1981   CATARACT EXTRACTION W/ INTRAOCULAR LENS IMPLANT Bilateral    COLONOSCOPY     CORONARY PRESSURE/FFR STUDY N/A 11/04/2017   Procedure: INTRAVASCULAR PRESSURE WIRE/FFR STUDY;  Surgeon: Mady Bruckner, MD;  Location: [facility] INVASIVE CV LAB;  Service: Cardiovascular;  Laterality: N/A;   CORONARY STENT INTERVENTION N/A 11/04/2017   Procedure: CORONARY STENT INTERVENTION;  Surgeon: Mady Bruckner, MD;  Location: [facility] INVASIVE CV LAB;  Service: Cardiovascular;  Laterality: N/A;   CORONARY ULTRASOUND/IVUS N/A 11/04/2017   Procedure: Intravascular Ultrasound/IVUS;  Surgeon: Mady Bruckner, MD;  Location: [facility] INVASIVE CV LAB;  Service: Cardiovascular;  Laterality: N/A;   ENDOVENOUS ABLATION SAPHENOUS VEIN W/ LASER Left 12/29/2019   endovenous laser ablation left greater saphenous vein by Carlin Haddock MD    ESOPHAGOGASTRODUODENOSCOPY     KNEE ARTHROSCOPY Right 1998, [id number]   LAPAROSCOPIC CHOLECYSTECTOMY  2007   LEFT HEART CATH AND CORONARY ANGIOGRAPHY N/A 11/04/2017   Procedure: LEFT HEART CATH AND CORONARY ANGIOGRAPHY;  Surgeon: Mady Bruckner, MD;  Location: [facility] INVASIVE CV LAB;  Service: Cardiovascular;  Laterality: N/A;   LEFT HEART CATHETERIZATION WITH CORONARY ANGIOGRAM N/A 11/21/2011   Procedure: LEFT HEART CATHETERIZATION WITH CORONARY ANGIOGRAM;  Surgeon: Maude JAYSON Emmer, MD;  Location: [facility] CATH LAB;  Service: Cardiovascular;  Laterality: N/A;   TONSILLECTOMY  1975   VAGINAL HYSTERECTOMY  1983   still have my ovaries   Family History  Problem Relation Age of Onset   Alzheimer's disease Mother    Diabetes type II Mother    Hiatal  hernia Mother     Diabetes Mother    Emphysema Father    COPD Father    Depression Sister        suicide   Diabetes Sister    Diabetes Daughter    Colon cancer Neg Hx    Stomach cancer Neg Hx    Esophageal cancer Neg Hx    Rectal cancer Neg Hx    Allergies as of 02/11/2024       Reactions   Epinephrine  Other (See Comments)   Pass out    Other    Other reaction(s): Myalgia   Statins Other (See Comments)   Myalgias: Simvastatin also caused back ache   Welchol  [colesevelam  Hcl]    myalgia   Amoxicillin Itching, Rash   Has patient had a PCN reaction causing immediate rash, facial/tongue/throat swelling, SOB or lightheadedness with hypotension: No Has patient had a PCN reaction causing severe rash involving mucus membranes or skin necrosis: No Has patient had a PCN reaction that required hospitalization: No Has patient had a PCN reaction occurring within the last 10 years: Yes If all of the above answers are NO, then may proceed with Cephalosporin use.        Medication List        Accurate as of February 11, 2024  2:40 PM. If you have any questions, ask your nurse or doctor.          CALTRATE 600 PO Take 650 mg by mouth daily.   dabigatran  150 MG Caps capsule Commonly known as: Pradaxa  Take 1 capsule (150 mg total) by mouth 2 (two) times daily.   ezetimibe  10 MG tablet Commonly known as: ZETIA  Take 10 mg by mouth daily.   fenofibrate  160 MG tablet TAKE 1 TABLET DAILY   fexofenadine 180 MG tablet Commonly known as: ALLEGRA Take 180 mg by mouth daily.   isosorbide  mononitrate 30 MG 24 hr tablet Commonly known as: IMDUR  Take 1 tablet (30 mg total) by mouth daily.   ketoconazole 2 % shampoo Commonly known as: NIZORAL Apply 1 Application topically 2 (two) times a week.   LEQVIO  [place] Inject into the skin.   levothyroxine  100 MCG tablet Commonly known as: SYNTHROID  Take 100 mcg by mouth daily before breakfast.   Magnesium  250 MG Tabs Take 250 mg by mouth daily.    metFORMIN  500 MG 24 hr tablet Commonly known as: GLUCOPHAGE -XR Take 500 mg by mouth 2 (two) times daily.   metoprolol  tartrate 50 MG tablet Commonly known as: LOPRESSOR  Take 1 tablet (50 mg total) by mouth 2 (two) times daily.   nitroGLYCERIN  0.4 MG SL tablet Commonly known as: NITROSTAT  Place 1 tablet (0.4 mg total) under the tongue every 5 (five) minutes as needed for chest pain.   NovoLIN 70/30 Kwikpen (70-30) 100 UNIT/ML KwikPen Generic drug: insulin  isophane & regular human KwikPen Inject 14-25 Units into the skin See admin instructions. Take 24 mg in the morning 14 mg at lunch and 25 mg a bedtime   pantoprazole  40 MG tablet Commonly known as: PROTONIX  TAKE 1 TABLET TWICE DAILY  BEFORE MEALS   pregabalin  50 MG capsule Commonly known as: LYRICA  Take 1 capsule (50 mg total) by mouth 2 (two) times daily.   PROBIOTIC DAILY PO Take 1 tablet by mouth daily.   Refresh Optive Mega-3 0.5-1-0.5 % Soln Generic drug: Carboxymeth-Glyc-Polysorb PF Place 1 drop into both eyes 3 (three) times daily.   valsartan  40 MG tablet Commonly known as: DIOVAN  Take 1 tablet (  40 mg total) by mouth daily.   Vitamin B12 1000 MCG Tbcr 1 tablet Orally Once a day; Duration: 30 day(s)        All past medical history, surgical history, allergies, family history, immunizations andmedications were updated in the EMR today and reviewed under the history and medication portions of their EMR.     ROS Negative, with the exception of above mentioned in HPI   Objective:  BP 124/82   Pulse 74   Temp 97.9 F (36.6 C)   Wt 182 lb 12.8 oz (82.9 kg)   SpO2 97%   BMI 29.96 kg/m  Body mass index is 29.96 kg/m. Physical Exam Vitals and nursing note reviewed.  Constitutional:      General: She is not in acute distress.    Appearance: Normal appearance. She is not ill-appearing, toxic-appearing or diaphoretic.  HENT:     Head: Normocephalic and atraumatic.  Eyes:     General: No scleral icterus.        Right eye: No discharge.        Left eye: No discharge.     Extraocular Movements: Extraocular movements intact.     Conjunctiva/sclera: Conjunctivae normal.     Pupils: Pupils are equal, round, and reactive to light.  Cardiovascular:     Rate and Rhythm: Normal rate and regular rhythm.     Heart sounds: No murmur heard. Pulmonary:     Effort: Pulmonary effort is normal. No respiratory distress.     Breath sounds: Normal breath sounds. No wheezing, rhonchi or rales.  Musculoskeletal:     Cervical back: Neck supple.     Right lower leg: No edema.     Left lower leg: No edema.  Skin:    General: Skin is warm.     Findings: No rash.  Neurological:     Mental Status: She is alert and oriented to person, place, and time. Mental status is at baseline.     Motor: No weakness.     Gait: Gait normal.  Psychiatric:        Mood and Affect: Mood normal.        Behavior: Behavior normal.        Thought Content: Thought content normal.        Judgment: Judgment normal.      No results found. No results found. No results found for this or any previous visit (from the past 24 hours).  Assessment/Plan: [name] [name] is a 68 y.o. female present for OV Chronic Conditions/illness Management Dizziness (Primary)/persistent vertigo/mild periventricular and deep cerebral white matter disease Uncertain etiology-cannot rule out it is not from the new leqvio  injections, does not seem to be listed as a common side effect of medication, but does seem to have some reports of evidence and after studies less than 2%. MRI was reassuring for no acute processes such as stroke, it is positive for mild white matter disease.  Periventricular and deep. Lab workup unrevealing - vestibular rehabReferral placed-patient was unable to tolerate secondary to prior neck surgery - sleep study completed by pulmonology, patient is awaiting results -Neurology referral has been placed for her (not until  October) Diabetes managed by endocrine-stable  Hypertension: Stable Continue Diovan  40 mg daily  PAF/acquired thrombophilia/peripheral vascular disease/CAD status post angioplasty/unstable angina Managed by cardiology-stable  Thoracic degenerative disc disease/bone spur/dextroscoliosis of thoracic spine/thoracic radiculopathy/neurosensory deficit/paresthesia/right thoracic back pain Stable CT thoracic spine: Disc heights are relatively preserved.  Scattered mild anterior endplate spurring throughout the  thoracic spine.  No high-grade spinal canal or neural foraminal stenosis [place]  controlled substance database was reviewed and appropriate today Continue Lyrica  50 mg twice daily prescribed.  Influenza vaccine completed today Mammogram order placed today  Reviewed expectations re: course of current medical issues. Discussed self-management of symptoms. Outlined signs and symptoms indicating need for more acute intervention. Patient verbalized understanding and all questions were answered. Patient received an After-Visit Summary.    Orders Placed This Encounter  Procedures   MM 3D SCREENING MAMMOGRAM BILATERAL BREAST W/IMPLANT   Flu vaccine HIGH DOSE PF(Fluzone Trivalent)   Meds ordered this encounter  Medications   pregabalin  (LYRICA ) 50 MG capsule    Sig: Take 1 capsule (50 mg total) by mouth 2 (two) times daily.    Dispense:  180 capsule    Refill:  1   valsartan  (DIOVAN ) 40 MG tablet    Sig: Take 1 tablet (40 mg total) by mouth daily.    Dispense:  90 tablet    Refill:  1   Referral Orders  No referral(s) requested today      Note is dictated utilizing voice recognition software. Although note has been proof read prior to signing, occasional typographical errors still can be missed. If any questions arise, please do not hesitate to call for verification.   electronically signed by:  Charlies Bellini, DO  [facility] - [place]

## 2024-02-12 ENCOUNTER — Encounter

## 2024-03-05 ENCOUNTER — Ambulatory Visit: Payer: Self-pay | Admitting: Adult Health

## 2024-03-08 ENCOUNTER — Ambulatory Visit (INDEPENDENT_AMBULATORY_CARE_PROVIDER_SITE_OTHER): Admitting: Neurology

## 2024-03-08 ENCOUNTER — Encounter: Payer: Self-pay | Admitting: Neurology

## 2024-03-08 VITALS — BP 126/80 | HR 72 | Ht 65.0 in | Wt 182.0 lb

## 2024-03-08 DIAGNOSIS — H8111 Benign paroxysmal vertigo, right ear: Secondary | ICD-10-CM

## 2024-03-08 DIAGNOSIS — H8301 Labyrinthitis, right ear: Secondary | ICD-10-CM

## 2024-03-08 NOTE — Progress Notes (Signed)
 ATC x1. LVMTCB

## 2024-03-08 NOTE — Progress Notes (Signed)
 @[facility]   Provider:  Dedra Gores, MD  Primary Care Physician:  Catherine Charlies LABOR, DO [address] [place] [address]  Referring Provider: Catherine Charlies LABOR, Do [address] [place],  [place] [address]        Chief Concern for this Consultation:   Patient presents with          HPI: I have the pleasure of meeting with [name] [name]  who is a 68 y.o.  female patient,  and seen upon a referral by Dr Catherine on 03-08-2024 for a Vertigo Consultation.  The patient's referral information asked for a vertigo evaluation.  She waeas hearing  aids, no fluid in the ears no tinnitus  .  Chief concern according to patient:  I am dizzy every day and its triggered by movements, started in late MAY 2025,   No preceding infection, head injuries, only one medication change to INCLISIRAN for cholesterol.    [name] [name]  presented with a medical history of :  cervical anterior fusion. 2023.   Atrial fib,  rate controlled.   CAD with stents.     Past Medical History:  Diagnosis Date   Accelerating angina (HCC) 11/04/2017   Allergy     Asthma    Atrial fibrillation (HCC) 08/27/2020   Bone spur 10/11/2021   Cataract    Chronic kidney disease    Chronic pain disorder    neck and shoulder   Chronic pain of right upper extremity 11/10/2017   Coronary artery disease 2019   Stent x2   CTS (carpal tunnel syndrome) 01/27/2013   right   Dextroscoliosis of thoracic spine 10/11/2021   Diabetes mellitus type II    Diabetic gastroparesis (HCC)    Dysrhythmia    A. Fib   Esophageal reflux 08/25/2013   [place], Dr Norvell   FATTY LIVER DISEASE 04/27/2007   Qualifier: Diagnosis of  By: Anice CMA, Darlene     Focal muscle atrophy 09/26/2015   Fundic gland polyps of stomach, benign    Globus sensation 04/13/2018   Hordeolum externum of right upper eyelid 12/24/2021   HTN (hypertension)    Hx of adenomatous colonic polyps 12/10/2021   Hyperlipidemia, mixed 05/04/2007   Qualifier:  Diagnosis of  By: Georgian ROSALEA CHARM Lamar crestor  caused myalgias even at low dose Livalo  caused myalgias Lipitor  Mother with severe reaction myalgias Simvastatin, Welchol     Hypokalemia 05/25/2013   Improved stopping HCTZ. Was noted to have an elevated glucose when K was low. Recheck renal next week after starting Maxzide   Hypomagnesemia 08/10/2014   Hypothyroidism    Iron  deficiency anemia 08/07/2018   Iron  malabsorption 08/07/2018   Laryngitis 08/25/2017   Leg cramps 04/15/2016   Nausea without vomiting 08/08/2014   Neurosensory deficit 10/26/2021   NONSPEC ELEVATION OF LEVELS OF TRANSAMINASE/LDH 05/01/2007   Qualifier: Diagnosis of  By: Eyvonne Jacob     Osteoarthritis    chronic, right knee (11/04/2017)   Plantar fasciitis    Pneumonia    Rosacea 10/22/2016   Urine incontinence      Family medical history: Mother had vertigo.      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 An adult son (53) is on his won, 5 grandchildren.   The workplace involves physical activity, outdoor activity, travel.  Nicotine use: /.  ETOH use: /,  Caffeine  intake  is rare , Tea ( hot ).  Exercises ; none , osteoarthritis , left hip injury , both knees.      Sleep habits and routines are as follows:  The patient's dinner time is around 6-7 PM.     Review of Systems: Out of a complete 14 system review, the patient complains of only the following symptoms, and all other reviewed systems are negative.:  BPV-      Social History   Socioeconomic History   Marital status: Married    Spouse name: Not on file   Number of children: 1   Years of education: Not on file   Highest education level: 12th grade  Occupational History   Occupation: [employer]  Tobacco Use   Smoking status: Never    Passive exposure: Never   Smokeless tobacco: Never   Tobacco comments:    Never smoked 11/12/23   Vaping Use   Vaping status: Never Used  Substance and Sexual Activity   Alcohol use: Not Currently   Drug use: Never   Sexual activity: Not Currently    Partners: Male  Other Topics Concern   Not on file  Social History Narrative   Marital status/children/pets: Married, 1 child.    Education/employment: HS   Safety:      -smoke alarm in the home:Yes     - wears seatbelt: Yes     - Feels safe in their relationships: Yes   Social Drivers of Corporate investment banker Strain: Low Risk  (01/10/2024)   Overall Financial Resource Strain (CARDIA)    Difficulty of Paying Living Expenses: Not hard at all  Food Insecurity: No Food Insecurity (01/10/2024)   Hunger Vital Sign    Worried About Running Out of Food in the Last Year: Never true    Ran Out of Food in the Last Year: Never true  Transportation Needs: No Transportation Needs (01/10/2024)   PRAPARE - Administrator, Civil Service (Medical): No    Lack of Transportation (Non-Medical): No  Physical Activity: Inactive (01/10/2024)   Exercise Vital Sign    Days of Exercise per Week: 0 days    Minutes of Exercise per Session: Not on file  Stress: No Stress Concern Present (01/10/2024)   [employer] of Occupational Health - Occupational Stress Questionnaire    Feeling of Stress: Not at all  Social Connections: Socially Isolated (01/10/2024)   Social Connection and Isolation Panel    Frequency of Communication with Friends and Family: Once a week    Frequency of Social Gatherings with Friends and Family: Once a week    Attends Religious Services: Never    Database administrator or Organizations: No    Attends Engineer, structural: Not on file    Marital Status: Married    Family History  Problem Relation Age of Onset   Alzheimer's disease Mother    Diabetes type II Mother    Hiatal hernia Mother    Diabetes Mother    Emphysema Father    COPD Father    Depression Sister        suicide   Diabetes  Sister    Stroke Maternal Grandfather    Diabetes Daughter    Colon cancer Neg Hx    Stomach cancer Neg Hx    Esophageal cancer Neg Hx    Rectal cancer Neg Hx    Seizures Neg Hx     Past Medical History:  Diagnosis  Date   Accelerating angina (HCC) 11/04/2017   Allergy     Asthma    Atrial fibrillation (HCC) 08/27/2020   Bone spur 10/11/2021   Cataract    Chronic kidney disease    Chronic pain disorder    neck and shoulder   Chronic pain of right upper extremity 11/10/2017   Coronary artery disease 2019   Stent x2   CTS (carpal tunnel syndrome) 01/27/2013   right   Dextroscoliosis of thoracic spine 10/11/2021   Diabetes mellitus type II    Diabetic gastroparesis (HCC)    Dysrhythmia    A. Fib   Esophageal reflux 08/25/2013   [place], Dr Norvell   FATTY LIVER DISEASE 04/27/2007   Qualifier: Diagnosis of  By: Anice CMA, Darlene     Focal muscle atrophy 09/26/2015   Fundic gland polyps of stomach, benign    Globus sensation 04/13/2018   Hordeolum externum of right upper eyelid 12/24/2021   HTN (hypertension)    Hx of adenomatous colonic polyps 12/10/2021   Hyperlipidemia, mixed 05/04/2007   Qualifier: Diagnosis of  By: Georgian ROSALEA CHARM Lamar crestor  caused myalgias even at low dose Livalo  caused myalgias Lipitor  Mother with severe reaction myalgias Simvastatin, Welchol     Hypokalemia 05/25/2013   Improved stopping HCTZ. Was noted to have an elevated glucose when K was low. Recheck renal next week after starting Maxzide   Hypomagnesemia 08/10/2014   Hypothyroidism    Iron  deficiency anemia 08/07/2018   Iron  malabsorption 08/07/2018   Laryngitis 08/25/2017   Leg cramps 04/15/2016   Nausea without vomiting 08/08/2014   Neurosensory deficit 10/26/2021   NONSPEC ELEVATION OF LEVELS OF TRANSAMINASE/LDH 05/01/2007   Qualifier: Diagnosis of  By: Eyvonne Jacob     Osteoarthritis    chronic, right knee (11/04/2017)   Plantar fasciitis    Pneumonia    Rosacea  10/22/2016   Urine incontinence     Past Surgical History:  Procedure Laterality Date   ANTERIOR CERVICAL DECOMP/DISCECTOMY FUSION N/A 11/25/2022   Procedure: ANTERIOR CERVICAL DISCECTOMY AND FUSION CERVICAL FIVE TO SEVEN;  Surgeon: Burnetta Aures, MD;  Location: [facility] OR;  Service: Orthopedics;  Laterality: N/A;   AUGMENTATION MAMMAPLASTY Bilateral 2002   BLADDER SUSPENSION  1981   CATARACT EXTRACTION W/ INTRAOCULAR LENS IMPLANT Bilateral    COLONOSCOPY     CORONARY PRESSURE/FFR STUDY N/A 11/04/2017   Procedure: INTRAVASCULAR PRESSURE WIRE/FFR STUDY;  Surgeon: Mady Bruckner, MD;  Location: [facility] INVASIVE CV LAB;  Service: Cardiovascular;  Laterality: N/A;   CORONARY STENT INTERVENTION N/A 11/04/2017   Procedure: CORONARY STENT INTERVENTION;  Surgeon: Mady Bruckner, MD;  Location: [facility] INVASIVE CV LAB;  Service: Cardiovascular;  Laterality: N/A;   CORONARY ULTRASOUND/IVUS N/A 11/04/2017   Procedure: Intravascular Ultrasound/IVUS;  Surgeon: Mady Bruckner, MD;  Location: [facility]VAS[facility] CV [facility];  Service: Cardiovascular;  Laterality: N/A;   ENDOVENOUS ABLATION SAPHENOUS VEIN W/ LASER Left 12/29/2019   endovenous laser ablation left greater saphenous vein by Carlin Haddock MD    ESOPHAGOGASTRODUODENOSCOPY     KNEE ARTHROSCOPY Right 1998, [id number]   LAPAROSCOPIC CHOLECYSTECTOMY  2007   LEFT HEART CATH AND CORONARY ANGIOGRAPHY N/A 11/04/2017   Procedure: LEFT HEART CATH AND CORONARY ANGIOGRAPHY;  Surgeon: Mady Bruckner, MD;  Location: [facility] INVAS[facility];  Service: Cardiovascular;  Laterality: N/A;   LEFT HEART CATHETERIZATION WITH CORONARY ANGIOGRAM N/A 11/21/2011   Procedure: LEFT HEART CATHETERIZATION WITH CORONARY ANGIOGRAM;  Surgeon: Maude JAYSON Emmer, MD;  Location: [facility] CATH LAB;  Service: Cardiovascular;  Laterality: N/A;   TONSILLECTOMY  1975   VAGINAL HYSTERECTOMY  1983   still have my ovaries     Current Outpatient Medications on File Prior to Visit  Medication Sig Dispense Refill    Calcium  Carbonate (CALTRATE 600 PO) Take 650 mg by mouth daily.     Carboxymeth-Glyc-Polysorb PF (REFRESH OPTIVE MEGA-3) 0.5-1-0.5 % SOLN Place 1 drop into both eyes 3 (three) times daily.     Cyanocobalamin  (VITAMIN B12) 1000 MCG TBCR 1 tablet Orally Once a day; Duration: 30 day(s)     dabigatran  (PRADAXA ) 150 MG CAPS capsule Take 1 capsule (150 mg total) by mouth 2 (two) times daily. 180 capsule 3   ezetimibe  (ZETIA ) 10 MG tablet Take 10 mg by mouth daily.     fenofibrate  160 MG tablet TAKE 1 TABLET DAILY 90 tablet 3   fexofenadine (ALLEGRA) 180 MG tablet Take 180 mg by mouth daily.     Inclisiran Sodium  (LEQVIO  [facility]) Inject into the skin.     isosorbide  mononitrate (IMDUR ) 30 MG 24 hr tablet Take 1 tablet (30 mg total) by mouth daily. 90 tablet 3   ketoconazole (NIZORAL) 2 % shampoo Apply 1 Application topically 2 (two) times a week.     levothyroxine  (SYNTHROID ) 100 MCG tablet Take 100 mcg by mouth daily before breakfast.     Magnesium  250 MG TABS Take 250 mg by mouth daily.     metFORMIN  (GLUCOPHAGE -XR) 500 MG 24 hr tablet Take 500 mg by mouth 2 (two) times daily.     metoprolol  tartrate (LOPRESSOR ) 50 MG tablet Take 1 tablet (50 mg total) by mouth 2 (two) times daily. 60 tablet 2   nitroGLYCERIN  (NITROSTAT ) 0.4 MG SL tablet Place 1 tablet (0.4 mg total) under the tongue every 5 (five) minutes as needed for chest pain. 25 tablet PRN   NOVOLIN 70/30 KWIKPEN (70-30) 100 UNIT/ML KwikPen Inject 14-25 Units into the skin See admin instructions. Take 24 mg in the morning 14 mg at lunch and 25 mg a bedtime     pantoprazole  (PROTONIX ) 40 MG tablet TAKE 1 TABLET TWICE DAILY  BEFORE MEALS 180 tablet 0   pregabalin  (LYRICA ) 50 MG capsule Take 1 capsule (50 mg total) by mouth 2 (two) times daily. 180 capsule 1   Probiotic Product (PROBIOTIC DAILY PO) Take 1 tablet by mouth daily.     valsartan  (DIOVAN ) 40 MG tablet Take 1 tablet (40 mg total) by mouth daily. 90 tablet 1   No current facility-administered  medications on file prior to visit.    Allergies  Allergen Reactions   Epinephrine  Other (See Comments)    Pass out    Other     Other reaction(s): Myalgia   Statins Other (See Comments)    Myalgias: Simvastatin also caused back ache   Welchol  [Colesevelam  Hcl]     myalgia   Amoxicillin Itching and Rash    Has patient had a PCN reaction causing immediate rash, facial/tongue/throat swelling, SOB or lightheadedness with hypotension: No Has patient had a PCN reaction causing severe rash involving mucus membranes or skin necrosis: No Has patient had a PCN reaction that required hospitalization: No Has patient had a PCN reaction occurring within the last 10 years: Yes If all of the above answers are NO, then may proceed with Cephalosporin use.     Vitals:   03/08/24 1344  BP: 126/80  Pulse: 72  SpO2: 98%     Physical exam:   General: The patient was alert and  appears not in acute distress.  Mood and affect are appropriate .  The patient's interactions are: Cooperative, makes eye contact, follows the instructions and answers questions coherently.  The patient is groomed and appropriately groomed and dressed. Head: Normocephalic, atraumatic.  Neck is status post anterior cervical fusion,  Cardiovascular:  Regular rate and cardiac rhythm by palpable pulse. Respiratory: no audible wheezing, no tachypnoea.   Skin:  Without evidence of ankle edema. No discoloration.  Trunk:  BMI is 30 The patient's posture was erect.   Neurologic exam : The patient was awake and alert, oriented to place and time.   Attention span & concentration ability appeared normal.  Speech was fluent, without dysarthria, dysphonia or aphasia, and of normal volume.     Cranial nerves:  There was no loss of smell or taste reported  Pupils are round, equal in size and briskly reactive to light.  Extraocular movements in vertical and horizontal planes were intact and without nystagmus. (No Diplopia  reported).  Rapid head movements with eyes closed caused retropulsion.  Visual fields by finger perimetry are intact. Hearing was intact to soft voice.    Facial sensation intact to fine touch.  Facial motor strength: Symmetric movement and tongue and uvula move midline.  Neck ROM: rotation, tilt and flexion extension were intact for age and shoulder shrug was symmetrical.    Motor exam:  Symmetric bulk, strength and ROM.   Normal tone without cog- wheeling, and symmetric grip strength.   Sensory:  Fine touch and vibration were tested by tuning fork and intact.  Proprioception tested in the upper extremities was normal.   Coordination:   The Finger-to-nose maneuver was intact without evidence of ataxia, dysmetria or tremor.   Gait and station: Patient could rise unassisted from a seated position, without bracing, and walked without assistive device.   She is careful, turning with 4 steps and produced normal arm swing  Stance was of normal  width. Toe and heel walk were =deferred. No limp was noted.  No shuffle. The patient's gait posture was erect.    Deep tendon reflexes: Upper extremities did show symmetric DTRs. Lower extremity DTRs were symmetric and brisk/ attenuated.   Babinski response was deferred     I would like to thank Catherine Charlies LABOR, DO and Catherine Charlies LABOR, Do [address] [place],  [place] [address] for allowing me to meet with [name] [name].   In short, [name] [name].is presenting with positional Vertigo .  Provoked by upward gaze  and turning to the right or sleeping/ resting on her left side.   1)  The patient  experienced positional vertigo with a retropulsion sensation, some times she feels spinning in the space around her( when in bed) . 2) posterior vestibule likely affected.  Vestibulitis / BPV.  3) rule out vascular component.MRI was benign :  Mild periventricular and deep cerebral white matter disease. No apparent acute process.   My Plan is to proceed  with:   1)  vestibular rehab:  gentle - because of anterior fusion.  2)  carotid and vertebral doppler studies.  3) she has been prescribed Antivert  and has not taken it.     I plan to follow up personally or through our NP within 6 months if the patient has abnormal  Doppler studies or vestibula rehab shows unexpected results. .   A total time of  45  minutes consistent of a part of face to face encounter , exam  and interview,  and additional preparation time for chart review was spent .  Additionally, the following were reviewed: Past medical records, past medical and surgical history, family and social background, as well as relevant laboratory results, imaging findings, and medical notes, where applicable.  This note was generated by myself in part by using dictation software, and as a result, it may contain unintentional typos and errors.  Nevertheless, effort was made to accurately convey the pertinent aspects of the patient's visit.   Dedra Gores, MD  [facility] Sleep Board certified in Sleep Medicine by The [employer] of Sleep Medicine and Diplomate of the [employer] of Sleep [employer] (AASM) . Board certified In Neurology, Diplomat of the ABPN,  Fellow of the [employer] of Neu[employer].

## 2024-03-08 NOTE — Telephone Encounter (Signed)
 No further action needed at this time. Appt already scheduled

## 2024-03-08 NOTE — Patient Instructions (Signed)
 Vertigo Vertigo is the feeling that you or the things around you are moving or spinning when they're not. It's different than feeling dizzy. It can also cause: Loss of balance. Trouble standing or walking. Nausea and vomiting. This feeling can come and go at any time. It can last from a few seconds to minutes or even hours. It may go away on its own or be treated with medicine. What are the types of vertigo? There are two types of vertigo: Peripheral vertigo happens when parts of your inner ear don't work like they should. This is the more common type. Central vertigo happens when your brain and spinal cord don't work like they should. Your health care provider will do tests to find out what kind of vertigo you have. This will help them decide on the right treatment for you. Follow these instructions at home: Eating and drinking Drink enough fluid to keep your pee (urine) pale yellow. Do not drink alcohol. Activity When you get up in the morning, first sit up on the side of the bed. When you feel okay, stand slowly while holding onto something. Move slowly. Avoid sudden body or head movements. Avoid certain positions, as told by your provider. Use a cane if you have trouble standing or walking. Sit down right away if you feel unsteady. Place items in your home so they're easy for you to reach without bending or leaning over. Return to normal activities when you're told. Ask what things are safe for you to do. General instructions Take your medicines only as told by your provider. Contact a health care provider if: Your medicines don't help or make your vertigo worse. You get new symptoms. You have a fever. You have nausea or vomiting. Your family or friends spot any changes in how you're acting. A part of your body goes numb. You feel tingling and prickling in a part of your body. You get very bad headaches. Get help right away if: You're always dizzy or you faint. You have a  stiff neck. You have trouble moving or speaking. Your hands, arms, or legs feel weak. Your hearing or eyesight changes. These symptoms may be an emergency. Call 911 right away. Do not wait to see if the symptoms will go away. Do not drive yourself to the hospital. This information is not intended to replace advice given to you by your health care provider. Make sure you discuss any questions you have with your health care provider. Document Revised: 02/13/2023 Document Reviewed: 08/16/2022 Elsevier Patient Education  2024 [employer] Inc[employer]  How to Perform the Epley Maneuver The Epley maneuver is an exercise that relieves symptoms of vertigo. Vertigo is the feeling that you or your surroundings are moving when they are not. When you feel vertigo, you may feel like the room is spinning and may have trouble walking. The Epley maneuver is used for a type of vertigo caused by a calcium  deposit in a part of the inner ear. The maneuver involves changing head positions to help the deposit move out of the area. You can do this maneuver at home whenever you have symptoms of vertigo. You can repeat it in 24 hours if your vertigo has not gone away. Even though the Epley maneuver may relieve your vertigo for a few weeks, it is possible that your symptoms will return. This maneuver relieves vertigo, but it does not relieve dizziness. What are the risks? If it is done correctly, the Epley maneuver is considered safe. Sometimes it  can lead to dizziness or nausea that goes away after a short time. If you develop other symptoms--such as changes in vision, weakness, or numbness--stop doing the maneuver and call your health care provider. Supplies needed: A bed or table. A pillow. How to do the Epley maneuver     Sit on the edge of a bed or table with your back straight and your legs extended or hanging over the edge of the bed or table. Turn your head halfway toward the affected ear or side as told by your health  care provider. Lie backward quickly with your head turned until you are lying flat on your back. Your head should dangle (head-hanging position). You may want to position a pillow under your shoulders. Hold this position for at least 30 seconds. If you feel dizzy or have symptoms of vertigo, continue to hold the position until the symptoms stop. Turn your head to the opposite direction until your unaffected ear is facing down. Your head should continue to dangle. Hold this position for at least 30 seconds. If you feel dizzy or have symptoms of vertigo, continue to hold the position until the symptoms stop. Turn your whole body to the same side as your head so that you are positioned on your side. Your head will now be nearly facedown and no longer needs to dangle. Hold for at least 30 seconds. If you feel dizzy or have symptoms of vertigo, continue to hold the position until the symptoms stop. Sit back up. You can repeat the maneuver in 24 hours if your vertigo does not go away. Follow these instructions at home: For 24 hours after doing the Epley maneuver: Keep your head in an upright position. When lying down to sleep or rest, keep your head raised (elevated) with two or more pillows. Avoid excessive neck movements. Activity Do not drive or use machinery if you feel dizzy. After doing the Epley maneuver, return to your normal activities as told by your health care provider. Ask your health care provider what activities are safe for you. General instructions Drink enough fluid to keep your urine pale yellow. Do not drink alcohol. Take over-the-counter and prescription medicines only as told by your health care provider. Keep all follow-up visits. This is important. Preventing vertigo symptoms Ask your health care provider if there is anything you should do at home to prevent vertigo. He or she may recommend that you: Keep your head elevated with two or more pillows while you sleep. Do not  sleep on the side of your affected ear. Get up slowly from bed. Avoid sudden movements during the day. Avoid extreme head positions or movement, such as looking up or bending over. Contact a health care provider if: Your vertigo gets worse. You have other symptoms, including: Nausea. Vomiting. Headache. Get help right away if you: Have vision changes. Have a headache or neck pain that is severe or getting worse. Cannot stop vomiting. Have new numbness or weakness in any part of your body. These symptoms may represent a serious problem that is an emergency. Do not wait to see if the symptoms will go away. Get medical help right away. Call your local emergency services (911 in the [place] Do not drive yourself to the hospital. Summary Vertigo is the feeling that you or your surroundings are moving when they are not. The Epley maneuver is an exercise that relieves symptoms of vertigo. If the Epley maneuver is done correctly, it is considered safe. This information is  not intended to replace advice given to you by your health care provider. Make sure you discuss any questions you have with your health care provider. Document Revised: 02/07/2023 Document Reviewed: 02/07/2023 Elsevier Patient Education  2024 [employer].

## 2024-03-09 ENCOUNTER — Encounter: Payer: Self-pay | Admitting: Adult Health

## 2024-03-09 ENCOUNTER — Ambulatory Visit (INDEPENDENT_AMBULATORY_CARE_PROVIDER_SITE_OTHER): Admitting: Adult Health

## 2024-03-09 VITALS — BP 138/64 | HR 62 | Temp 97.7°F | Ht 65.0 in | Wt 183.6 lb

## 2024-03-09 DIAGNOSIS — G4733 Obstructive sleep apnea (adult) (pediatric): Secondary | ICD-10-CM

## 2024-03-09 DIAGNOSIS — Z6830 Body mass index (BMI) 30.0-30.9, adult: Secondary | ICD-10-CM | POA: Diagnosis not present

## 2024-03-09 NOTE — Progress Notes (Signed)
 "  @Patient  ID: [name] [name], female    DOB: Feb 10, 1956, 68 y.o.   MRN: [id number]  Chief Complaint  Patient presents with   Follow-up    HST f/u    Referring provider: Catherine Charlies LABOR, DO  HPI: 68 year old female seen for sleep consult August 2025 for restless sleep and daytime sleepiness found to have moderate obstructive sleep apnea Medical history significant for coronary disease and A-fib    TEST/EVENTS :  Discussed the use of AI scribe software for clinical note transcription with the patient, who gave verbal consent to proceed.  History of Present Illness [name] [name] is a 68 year old female with sleep apnea who presents for follow-up after a sleep study.  She underwent a sleep study January 26, 2024 confirming moderate sleep apnea with AHI 15/hour and SpO2 low at 86%.  We discussed her sleep study results in detail and went over treatment options.  Including oral appliance, weight loss, CPAP therapy, Zepbound and inspire.  She has a history of extensive cardiac issues, including atrial fibrillation,  She also has diabetes, managed with insulin   and a BMI of 30.  She experiences frequent nocturnal awakenings She is concerned about potential infections from CPAP use due to improper cleaning.   No history of thyroid  cancer, pancreatitis, severe gallbladder or liver disease, or major kidney disease.  She experiences shoulder and neck pain when attempting to sleep in a propped-up position due to a plate in her neck.      Allergies  Allergen Reactions   Epinephrine  Other (See Comments)    Pass out    Other     Other reaction(s): Myalgia   Statins Other (See Comments)    Myalgias: Simvastatin also caused back ache   Welchol  [Colesevelam  Hcl]     myalgia   Amoxicillin Itching and Rash    Has patient had a PCN reaction causing immediate rash, facial/tongue/throat swelling, SOB or lightheadedness with hypotension: No Has patient had a PCN reaction causing severe  rash involving mucus membranes or skin necrosis: No Has patient had a PCN reaction that required hospitalization: No Has patient had a PCN reaction occurring within the last 10 years: Yes If all of the above answers are NO, then may proceed with Cephalosporin use.     Immunization History  Administered Date(s) Administered   Fluad Quad(high Dose 65+) 02/25/2020, 03/13/2021, 04/11/2022   INFLUENZA, HIGH DOSE SEASONAL PF 02/11/2024   Influenza Split 03/27/2011, 04/08/2012   Influenza Whole 03/14/2009, 02/06/2010   Influenza, Quadrivalent, Recombinant, Inj, Pf 03/13/2021   Influenza,inj,Quad PF,6+ Mos 01/27/2013, 05/02/2014, 04/12/2017, 03/31/2018, 03/15/2019   Influenza-Unspecified 02/17/2023   PFIZER(Purple Top)SARS-COV-2 Vaccination 08/08/2019, 09/01/2019, 03/13/2021   PNEUMOCOCCAL CONJUGATE-20 04/11/2022   Pfizer Covid-19 Vaccine Bivalent Booster 52yrs & up 03/13/2021   Pneumococcal Polysaccharide-23 03/28/2008   Tdap 09/18/2011   Zoster Recombinant(Shingrix) 04/11/2022, 09/24/2022   Zoster, Live 04/15/2016    Past Medical History:  Diagnosis Date   Accelerating angina (HCC) 11/04/2017   Allergy     Asthma    Atrial fibrillation (HCC) 08/27/2020   Bone spur 10/11/2021   Cataract    Chronic kidney disease    Chronic pain disorder    neck and shoulder   Chronic pain of right upper extremity 11/10/2017   Coronary artery disease 2019   Stent x2   CTS (carpal tunnel syndrome) 01/27/2013   right   Dextroscoliosis of thoracic spine 10/11/2021   Diabetes mellitus type II    Diabetic gastroparesis (HCC)  Dysrhythmia    A. Fib   Esophageal reflux 08/25/2013   [place], Dr Norvell   FATTY LIVER DISEASE 04/27/2007   Qualifier: Diagnosis of  By: Anice CMA, Darlene     Focal muscle atrophy 09/26/2015   Fundic gland polyps of stomach, benign    Globus sensation 04/13/2018   Hordeolum externum of right upper eyelid 12/24/2021   HTN (hypertension)    Hx of  adenomatous colonic polyps 12/10/2021   Hyperlipidemia, mixed 05/04/2007   Qualifier: Diagnosis of  By: Georgian ROSALEA CHARM Lamar crestor  caused myalgias even at low dose Livalo  caused myalgias Lipitor  Mother with severe reaction myalgias Simvastatin, Welchol     Hypokalemia 05/25/2013   Improved stopping HCTZ. Was noted to have an elevated glucose when K was low. Recheck renal next week after starting Maxzide   Hypomagnesemia 08/10/2014   Hypothyroidism    Iron  deficiency anemia 08/07/2018   Iron  malabsorption 08/07/2018   Laryngitis 08/25/2017   Leg cramps 04/15/2016   Nausea without vomiting 08/08/2014   Neurosensory deficit 10/26/2021   NONSPEC ELEVATION OF LEVELS OF TRANSAMINASE/LDH 05/01/2007   Qualifier: Diagnosis of  By: Eyvonne Jacob     Osteoarthritis    chronic, right knee (11/04/2017)   Plantar fasciitis    Pneumonia    Rosacea 10/22/2016   Urine incontinence     Tobacco History: Social History   Tobacco Use  Smoking Status Never   Passive exposure: Never  Smokeless Tobacco Never  Tobacco Comments   Never smoked 11/12/23   Counseling given: Not Answered Tobacco comments: Never smoked 11/12/23   Outpatient Medications Prior to Visit  Medication Sig Dispense Refill   Calcium  Carbonate (CALTRATE 600 PO) Take 650 mg by mouth daily.     Carboxymeth-Glyc-Polysorb PF (REFRESH OPTIVE MEGA-3) 0.5-1-0.5 % SOLN Place 1 drop into both eyes 3 (three) times daily.     Cyanocobalamin  (VITAMIN B12) 1000 MCG TBCR 1 tablet Orally Once a day; Duration: 30 day(s)     dabigatran  (PRADAXA ) 150 MG CAPS capsule Take 1 capsule (150 mg total) by mouth 2 (two) times daily. 180 capsule 3   ezetimibe  (ZETIA ) 10 MG tablet Take 10 mg by mouth daily.     fenofibrate  160 MG tablet TAKE 1 TABLET DAILY 90 tablet 3   fexofenadine (ALLEGRA) 180 MG tablet Take 180 mg by mouth daily.     Inclisiran Sodium  (LEQVIO  Ruch) Inject into the skin.     isosorbide  mononitrate (IMDUR ) 30 MG 24 hr tablet Take 1 tablet  (30 mg total) by mouth daily. 90 tablet 3   ketoconazole (NIZORAL) 2 % shampoo Apply 1 Application topically 2 (two) times a week.     levothyroxine  (SYNTHROID ) 100 MCG tablet Take 100 mcg by mouth daily before breakfast.     Magnesium  250 MG TABS Take 250 mg by mouth daily.     metFORMIN  (GLUCOPHAGE -XR) 500 MG 24 hr tablet Take 500 mg by mouth 2 (two) times daily.     metoprolol  tartrate (LOPRESSOR ) 50 MG tablet Take 1 tablet (50 mg total) by mouth 2 (two) times daily. 60 tablet 2   nitroGLYCERIN  (NITROSTAT ) 0.4 MG SL tablet Place 1 tablet (0.4 mg total) under the tongue every 5 (five) minutes as needed for chest pain. 25 tablet PRN   NOVOLIN 70/30 KWIKPEN (70-30) 100 UNIT/ML KwikPen Inject 14-25 Units into the skin See admin instructions. Take 24 mg in the morning 14 mg at lunch and 25 mg a bedtime     pantoprazole  (  PROTONIX ) 40 MG tablet TAKE 1 TABLET TWICE DAILY  BEFORE MEALS 180 tablet 0   pregabalin  (LYRICA ) 50 MG capsule Take 1 capsule (50 mg total) by mouth 2 (two) times daily. 180 capsule 1   Probiotic Product (PROBIOTIC DAILY PO) Take 1 tablet by mouth daily.     valsartan  (DIOVAN ) 40 MG tablet Take 1 tablet (40 mg total) by mouth daily. 90 tablet 1   No facility-administered medications prior to visit.     Review of Systems:   Constitutional:   No  weight loss, night sweats,  Fevers, chills, +fatigue, or  lassitude.  HEENT:   No headaches,  Difficulty swallowing,  Tooth/dental problems, or  Sore throat,                No sneezing, itching, ear ache, nasal congestion, post nasal drip,   CV:  No chest pain,  Orthopnea, PND, swelling in lower extremities, anasarca, dizziness, palpitations, syncope.   GI  No heartburn, indigestion, abdominal pain, nausea, vomiting, diarrhea, change in bowel habits, loss of appetite, bloody stools.   Resp: No shortness of breath with exertion or at rest.  No excess mucus, no productive cough,  No non-productive cough,  No coughing up of blood.  No  change in color of mucus.  No wheezing.  No chest wall deformity  Skin: no rash or lesions.  GU: no dysuria, change in color of urine, no urgency or frequency.  No flank pain, no hematuria   MS:  No joint pain or swelling.  No decreased range of motion.  No back pain.    Physical Exam  BP 138/64   Pulse 62   Temp 97.7 F (36.5 C)   Ht 5' 5 (1.651 m)   Wt 183 lb 9.6 oz (83.3 kg)   SpO2 98% Comment: RA  BMI 30.55 kg/m   GEN: A/Ox3; pleasant , NAD, well nourished    HEENT:  [place]/AT,   NOSE-clear, THROAT-clear, no lesions, no postnasal drip or exudate noted.   NECK:  Supple w/ fair ROM; no JVD; normal carotid impulses w/o bruits; no thyromegaly or nodules palpated; no lymphadenopathy.    RESP  Clear  P & A; w/o, wheezes/ rales/ or rhonchi. no accessory muscle use, no dullness to percussion  CARD:  RRR, no m/r/g, no peripheral edema, pulses intact, no cyanosis or clubbing.  GI:   Soft & nt; nml bowel sounds; no organomegaly or masses detected.   Musco: Warm bil, no deformities or joint swelling noted.   Neuro: alert, no focal deficits noted.    Skin: Warm, no lesions or rashes    Lab Results:  CBC   BNP No results found for: BNP  ProBNP No results found for: PROBNP  Imaging: No results found.  Administration History     None          Latest Ref Rng & Units 11/17/2017    8:37 AM  PFT Results  FVC-Pre L 2.78   FVC-Predicted Pre % 81   FVC-Post L 2.76   FVC-Predicted Post % 81   Pre FEV1/FVC % % 88   Post FEV1/FCV % % 89   FEV1-Pre L 2.46   FEV1-Predicted Pre % 94   FEV1-Post L 2.46   DLCO uncorrected ml/min/mmHg 15.73   DLCO UNC% % 59   DLCO corrected ml/min/mmHg 17.00   DLCO COR %Predicted % 64   DLVA Predicted % 86   TLC L 4.90   TLC % Predicted % 92  RV % Predicted % 71     Lab Results  Component Value Date   NITRICOXIDE 28 10/01/2017        Assessment & Plan:   Assessment and Plan Assessment & Plan Moderate obstructive  sleep apnea   Moderate obstructive sleep apnea is confirmed by a sleep study, AHI 15/hour and SpO2 low at 86% we discussed potential for untreated sleep apnea with nocturnal hypoxemia -increasing cardiovascular and cerebrovascular risks. Discuss CPAP therapy, emphasizing its benefits and maintenance, as it is the gold standard for moderate sleep apnea, particularly with cardiovascular risk factors. Address concerns about comfort and infections, highlighting the low infection risk with proper maintenance and potential reduction in nocturia. Consult endocrinology about using GLP-1 agonists like Zepbound for weight loss and sleep apnea management, given her diabetes and insulin  use. Consider orthodontic referral for a dental device, Advise on positional therapy, such as elevating the head of the bed and avoiding the supine position. Discuss the potential benefits of weight loss on sleep apnea severity. Encourage follow-up if she opts for CPAP or a dental device.  She wants to think about this for a while and will contact us  once she has made a decision  - discussed how weight can impact sleep and risk for sleep disordered breathing - discussed options to assist with weight loss: combination of diet modification, cardiovascular and strength training exercises   - had an extensive discussion regarding the adverse health consequences related to untreated sleep disordered breathing - specifically discussed the risks for hypertension, coronary artery disease, cardiac dysrhythmias, cerebrovascular disease, and diabetes - lifestyle modification discussed   - discussed how sleep disruption can increase risk of accidents, particularly when driving - safe driving practices were discussed    Morbid obesity with a BMI at 30-continue with healthy weight loss options  Diabetes-continue follow-up with endocrinology  History of coronary artery disease continue follow-up with cardiology        Madelin Stank,  NP 03/09/2024  "

## 2024-03-09 NOTE — Patient Instructions (Addendum)
 Call back if you change your mind on CPAP therapy or [facility].  Do not drive if sleepy  Work on healthy weight loss  Sleep with head of bed at incline -30 degrees. And avoid sleeping on your back.  Discuss with Endocrinology regarding if Zepbound is an option for you.  Follow up in 6 months and As needed

## 2024-03-10 NOTE — Progress Notes (Signed)
 Pt had office visit on 03/09/24. NFN

## 2024-03-11 ENCOUNTER — Encounter: Payer: Self-pay | Admitting: Adult Health

## 2024-03-11 ENCOUNTER — Ambulatory Visit

## 2024-03-11 VITALS — BP 165/70 | HR 69 | Temp 97.7°F | Resp 16 | Ht 65.0 in | Wt 183.2 lb

## 2024-03-11 DIAGNOSIS — E785 Hyperlipidemia, unspecified: Secondary | ICD-10-CM | POA: Diagnosis not present

## 2024-03-11 DIAGNOSIS — Z9861 Coronary angioplasty status: Secondary | ICD-10-CM

## 2024-03-11 DIAGNOSIS — I2510 Atherosclerotic heart disease of native coronary artery without angina pectoris: Secondary | ICD-10-CM | POA: Diagnosis not present

## 2024-03-11 MED ORDER — INCLISIRAN SODIUM 284 MG/1.5ML ~~LOC~~ SOSY
284.0000 mg | PREFILLED_SYRINGE | Freq: Once | SUBCUTANEOUS | Status: AC
  Administered 2024-03-11: 284 mg via SUBCUTANEOUS
  Filled 2024-03-11: qty 1.5

## 2024-03-11 NOTE — Progress Notes (Signed)
 Diagnosis: Hyperlipidemia  Provider:  Mannam, Praveen MD  Procedure: Injection  Leqvio  (inclisiran), Dose: 284 mg, Site: subcutaneous, Number of injections: 1  Injection Site(s): Right arm  Post Care: observation not required, not first injection.  Discharge: Condition: Good, Destination: Home . AVS Declined  Performed by:  Waddell LOISE Freshwater, RN

## 2024-03-22 ENCOUNTER — Ambulatory Visit (HOSPITAL_BASED_OUTPATIENT_CLINIC_OR_DEPARTMENT_OTHER)
Admission: RE | Admit: 2024-03-22 | Discharge: 2024-03-22 | Disposition: A | Discharge status: Home / Self Care | Source: Ambulatory Visit | Attending: Neurology | Admitting: Neurology

## 2024-03-22 ENCOUNTER — Ambulatory Visit
Admission: RE | Admit: 2024-03-22 | Discharge: 2024-03-22 | Disposition: A | Source: Ambulatory Visit | Attending: Family Medicine | Admitting: Family Medicine

## 2024-03-22 ENCOUNTER — Telehealth: Payer: Self-pay | Admitting: Internal Medicine

## 2024-03-22 DIAGNOSIS — H8301 Labyrinthitis, right ear: Secondary | ICD-10-CM | POA: Diagnosis present

## 2024-03-22 DIAGNOSIS — H8111 Benign paroxysmal vertigo, right ear: Secondary | ICD-10-CM | POA: Insufficient documentation

## 2024-03-22 DIAGNOSIS — Z1231 Encounter for screening mammogram for malignant neoplasm of breast: Secondary | ICD-10-CM

## 2024-03-22 MED ORDER — PANTOPRAZOLE SODIUM 40 MG PO TBEC: 40.0000 mg | DELAYED_RELEASE_TABLET | Freq: Two times a day (BID) | ORAL | 0 refills | Status: DC

## 2024-03-22 NOTE — Telephone Encounter (Signed)
 Pantoprazole  refilled as requested to her mail order pharmacy. [name] [name] informed.

## 2024-03-22 NOTE — Telephone Encounter (Signed)
 PT is calling to have a refill for pantoprazole  sent to [facility]. She only has this week worth of medication left and has set an appointment for December for future refills. Please advise.

## 2024-03-23 ENCOUNTER — Ambulatory Visit: Payer: Self-pay | Admitting: Neurology

## 2024-03-25 ENCOUNTER — Ambulatory Visit: Payer: Self-pay | Admitting: Family Medicine

## 2024-04-07 ENCOUNTER — Encounter: Payer: Self-pay | Admitting: *Deleted

## 2024-04-07 DIAGNOSIS — Z006 Encounter for examination for normal comparison and control in clinical research program: Secondary | ICD-10-CM

## 2024-04-07 NOTE — Research (Signed)
 [facility] and left messge with medical records  Patient stated that she was started on Zetia  in Sept of 2023  Just need the office note faxed as to when she started Zetia .   Suzen Hardy :) RN BSN  Clinical Research Nurse  Be strong and take heart, all you who hope in the [place]. ~ Psalm 31:24

## 2024-04-16 ENCOUNTER — Ambulatory Visit: Payer: Self-pay

## 2024-04-16 NOTE — Telephone Encounter (Signed)
 FYI reviewed

## 2024-04-16 NOTE — Telephone Encounter (Signed)
 FYI Only or Action Required?: FYI only for provider: ED advised.  Patient was last seen in primary care on 02/11/2024 by Catherine Fuller A, DO.  Called Nurse Triage reporting Shortness of Breath.  Symptoms began several days ago.  Interventions attempted: Other: unsure.  Symptoms are: rapidly worsening.  Triage Disposition: Go to ED Now (Notify PCP)  Patient/caregiver understands and will follow disposition?: Yes                            Copied from CRM [id number]. Topic: Clinical - Red Word Triage >> Apr 16, 2024  1:25 PM China J wrote: [facility] that prompted transfer to Nurse Triage: Patient has a cold and would like to see someone today.   Symptoms include: wheezing, trouble breathing, congestion.  Reason for Disposition  [1] MODERATE difficulty breathing (e.g., speaks in phrases, SOB even at rest, pulse 100-120) AND [2] NEW-onset or WORSE than normal  Answer Assessment - Initial Assessment Questions 1. RESPIRATORY STATUS: Describe your breathing? (e.g., wheezing, shortness of breath, unable to speak, severe coughing)     Moderate SOB and wheezing Patient was noticeable SOB while talking to this RN, patient was able to speak only in short sentences and phrases 2. ONSET: When did this breathing problem begin?      Wednesday night    This RN advised ED. Patient verbalized understanding and agreed to go to ED immediately. This RN advised patient to call 911 for worsening symptoms. Patient verbalized understanding.  Protocols used: Breathing Difficulty-A-AH

## 2024-04-21 ENCOUNTER — Encounter: Payer: Self-pay | Admitting: Pharmacist

## 2024-05-07 ENCOUNTER — Ambulatory Visit: Admitting: Gastroenterology

## 2024-05-07 NOTE — Progress Notes (Deleted)
 [name] [name] [id number] [birth year]   Chief Complaint:  Referring Provider: Catherine Charlies LABOR, DO Primary GI MD: Dr. Avram  HPI: [name] [name] is a 68 y.o. female with past medical history of A-fib, CKD, chronic pain disorder, CAD s/p stent x 2, T2DM, diabetic gastroparesis, GERD, fatty liver, HTN, colon polyps, HLD, hypothyroidism, iron  deficiency anemia, cholecystectomy, hysterectomy who presents today for medication refill.    Last seen in office 09/28/2021.  Has history of GERD and has been on pantoprazole  40 mg twice daily.  Has been advised that she will need a follow-up office visit for any future refills.  Discussed the use of AI scribe software for clinical note transcription with the patient, who gave verbal consent to proceed.  History of Present Illness       Previous GI Procedures/Imaging   Colonoscopy 12/04/2021 - Two diminutive polyps in the rectum and in the transverse colon, removed with a cold snare. Resected and retrieved.  - Internal hemorrhoids.  - The examination was otherwise normal on direct and retroflexion views. - Recall 7 years Path: Surgical [P], colon, rectal, transverse, polyp (2) FINDINGS CONSISTENT WITH TUBULAR ADENOMA X 2. NO DEFINITE HIGH-GRADE DYSPLASIA OR MALIGNANCY IS SEEN. CLINICAL AND ENDOSCOPIC CORRELATION IS REQUIRED  EGD 08/06/2018 - A single gastric polyp. Biopsied. Cardia - friable  - Multiple gastric polyps. Biopsied. A few were friable  - Erythematous mucosa in the antrum. Biopsied.  - The examination was otherwise normal. Path: 1. Surgical [P], antrum gastritis BX - ANTRAL MUCOSA WITH MILD CHRONIC GASTRITIS. - WARTHIN-STARRY STAIN NEGATIVE FOR HELICOBACTER PYLORI. - NO INTESTINAL METAPLASIA, DYSPLASIA OR MALIGNANCY. 2. Surgical [P], proximal body and fundus polyp BX - FUNDIC GLAND POLYPS. - WARTHIN-STARRY STAIN NEGATIVE FOR HELICOBACTER PYLORI. - NO INTESTINAL METAPLASIA, ADENOMATOUS CHANGE OR MALIGNANCY. 3. Surgical [P],  cardia polyp BX - HYPERPLASTIC GASTRIC POLYP WITH MILD CHRONIC INFLAMMATION. - WARTHIN-STARRY STAIN NEGATIVE FOR HELICOBACTER PYLORI. - NO INTESTINAL METAPLASIA, ADENOMATOUS CHANGE OR MALIGNANCY.   Past Medical History:  Diagnosis Date   Accelerating angina (HCC) 11/04/2017   Allergy     Asthma    Atrial fibrillation (HCC) 08/27/2020   Bone spur 10/11/2021   Cataract    Chronic kidney disease    Chronic pain disorder    neck and shoulder   Chronic pain of right upper extremity 11/10/2017   Coronary artery disease 2019   Stent x2   CTS (carpal tunnel syndrome) 01/27/2013   right   Dextroscoliosis of thoracic spine 10/11/2021   Diabetes mellitus type II    Diabetic gastroparesis (HCC)    Dysrhythmia    A. Fib   Esophageal reflux 08/25/2013   [facility], Dr Norvell   FATTY LIVER DISEASE 04/27/2007   Qualifier: Diagnosis of  By: Anice CMA, Darlene     Focal muscle atrophy 09/26/2015   Fundic gland polyps of stomach, benign    Globus sensation 04/13/2018   Hordeolum externum of right upper eyelid 12/24/2021   HTN (hypertension)    Hx of adenomatous colonic polyps 12/10/2021   Hyperlipidemia, mixed 05/04/2007   Qualifier: Diagnosis of  By: Georgian ROSALEA CHARM Lamar crestor  caused myalgias even at low dose Livalo  caused myalgias Lipitor  Mother with severe reaction myalgias Simvastatin, Welchol     Hypokalemia 05/25/2013   Improved stopping HCTZ. Was noted to have an elevated glucose when K was low. Recheck renal next week after starting Maxzide   Hypomagnesemia 08/10/2014   Hypothyroidism    Iron  deficiency anemia 08/07/2018  Iron  malabsorption 08/07/2018   Laryngitis 08/25/2017   Leg cramps 04/15/2016   Nausea without vomiting 08/08/2014   Neurosensory deficit 10/26/2021   NONSPEC ELEVATION OF LEVELS OF TRANSAMINASE/LDH 05/01/2007   Qualifier: Diagnosis of  By: Eyvonne Jacob     Osteoarthritis    chronic, right knee (11/04/2017)   Plantar fasciitis    Pneumonia    Rosacea  10/22/2016   Urine incontinence     Past Surgical History:  Procedure Laterality Date   ANTERIOR CERVICAL DECOMP/DISCECTOMY FUSION N/A 11/25/2022   Procedure: ANTERIOR CERVICAL DISCECTOMY AND FUSION CERVICAL FIVE TO SEVEN;  Surgeon: Burnetta Aures, MD;  Location: [facility] OR;  Service: Orthopedics;  Laterality: N/A;   AUGMENTATION MAMMAPLASTY Bilateral 2002   BLADDER SUSPENSION  1981   CATARACT EXTRACTION W/ INTRAOCULAR LENS IMPLANT Bilateral    COLONOSCOPY     CORONARY PRESSURE/FFR STUDY N/A 11/04/2017   Procedure: INTRAVASCULAR PRESSURE WIRE/FFR STUDY;  Surgeon: Mady Bruckner, MD;  Location: [facility] INVASIVE CV [facility];  Service: Cardiovascular;  Laterality: N/A;   CORONARY STENT INTERVENTION N/A 11/04/2017   Procedure: CORONARY STENT INTERVENTION;  Surgeon: Mady Bruckner, MD;  Location: [facility] INVASIVE CV [facility];  Service: Cardiovascular;  Laterality: N/A;   CORONARY ULTRASOUND/IVUS N/A 11/04/2017   Procedure: Intravascular Ultrasound/IVUS;  Surgeon: Mady Bruckner, MD;  Location: [facility];  Service: Cardiovascular;  Laterality: N/A;   ENDOVENOUS ABLATION SAPHENOUS VEIN W/ LASER Left 12/29/2019   endovenous laser ablation left greater saphenous vein by Carlin Haddock MD    ESOPHAGOGASTRODUODENOSCOPY     KNEE ARTHROSCOPY Right 1998, [id number]   LAPAROSCOPIC CHOLECYSTECTOMY  2007   LEFT HEART CATH AND CORONARY ANGIOGRAPHY N/A 11/04/2017   Procedure: LEFT HEART CATH AND CORONARY ANGIOGRAPHY;  Surgeon: Mady Bruckner, MD;  Location: [facility];  Service: Cardiovascular;  Laterality: N/A;   LEFT HEART CATHETERIZATION WITH CORONARY ANGIOGRAM N/A 11/21/2011   Procedure: LEFT HEART CATHETERIZATION WITH CORONARY ANGIOGRAM;  Surgeon: Maude JAYSON Emmer, MD;  Location: [facility] CATH LAB;  Service: Cardiovascular;  Laterality: N/A;   TONSILLECTOMY  1975   VAGINAL HYSTERECTOMY  1983   still have my ovaries    Current Outpatient Medications  Medication Sig Dispense Refill   Calcium  Carbonate  (CALTRATE 600 PO) Take 650 mg by mouth daily.     Carboxymeth-Glyc-Polysorb PF (REFRESH OPTIVE MEGA-3) 0.5-1-0.5 % SOLN Place 1 drop into both eyes 3 (three) times daily.     Cyanocobalamin  (VITAMIN B12) 1000 MCG TBCR 1 tablet Orally Once a day; Duration: 30 day(s)     dabigatran  (PRADAXA ) 150 MG CAPS capsule Take 1 capsule (150 mg total) by mouth 2 (two) times daily. 180 capsule 3   ezetimibe  (ZETIA ) 10 MG tablet Take 10 mg by mouth daily.     fenofibrate  160 MG tablet TAKE 1 TABLET DAILY 90 tablet 3   fexofenadine (ALLEGRA) 180 MG tablet Take 180 mg by mouth daily.     Inclisiran Sodium  (LEQVIO  [place]) Inject into the skin.     isosorbide  mononitrate (IMDUR ) 30 MG 24 hr tablet Take 1 tablet (30 mg total) by mouth daily. 90 tablet 3   ketoconazole (NIZORAL) 2 % shampoo Apply 1 Application topically 2 (two) times a week.     levothyroxine  (SYNTHROID ) 100 MCG tablet Take 100 mcg by mouth daily before breakfast.     Magnesium  250 MG TABS Take 250 mg by mouth daily.     metFORMIN  (GLUCOPHAGE -XR) 500 MG 24 hr tablet Take 500 mg by mouth 2 (two) times daily.  metoprolol  tartrate (LOPRESSOR ) 50 MG tablet Take 1 tablet (50 mg total) by mouth 2 (two) times daily. 60 tablet 2   nitroGLYCERIN  (NITROSTAT ) 0.4 MG SL tablet Place 1 tablet (0.4 mg total) under the tongue every 5 (five) minutes as needed for chest pain. 25 tablet PRN   NOVOLIN 70/30 KWIKPEN (70-30) 100 UNIT/ML KwikPen Inject 14-25 Units into the skin See admin instructions. Take 24 mg in the morning 14 mg at lunch and 25 mg a bedtime     pantoprazole  (PROTONIX ) 40 MG tablet Take 1 tablet (40 mg total) by mouth 2 (two) times daily before a meal. 180 tablet 0   pregabalin  (LYRICA ) 50 MG capsule Take 1 capsule (50 mg total) by mouth 2 (two) times daily. 180 capsule 1   Probiotic Product (PROBIOTIC DAILY PO) Take 1 tablet by mouth daily.     valsartan  (DIOVAN ) 40 MG tablet Take 1 tablet (40 mg total) by mouth daily. 90 tablet 1   No current  facility-administered medications for this visit.    Allergies as of 05/07/2024 - Review Complete 03/11/2024  Allergen Reaction Noted   Epinephrine  Other (See Comments) 07/25/2021   Other  08/10/2020   Statins Other (See Comments) 08/24/2013   Welchol  [colesevelam  hcl]  08/24/2013   Amoxicillin Itching and Rash 04/04/2015    Family History  Problem Relation Age of Onset   Alzheimer's disease Mother    Diabetes type II Mother    Hiatal hernia Mother    Diabetes Mother    Emphysema Father    COPD Father    Depression Sister        suicide   Diabetes Sister    Stroke Maternal Grandfather    Diabetes Daughter    Colon cancer Neg Hx    Stomach cancer Neg Hx    Esophageal cancer Neg Hx    Rectal cancer Neg Hx    Seizures Neg Hx     Social History[1]   Review of Systems:    Constitutional: No weight loss, fever, chills, weakness or fatigue Eyes: No change in vision Ears, Nose, Throat:  No change in hearing or congestion Skin: No rash or itching Cardiovascular: No chest pain, chest pressure or palpitations   Respiratory: No SOB or cough Gastrointestinal: See HPI and otherwise negative Genitourinary: No dysuria or change in urinary frequency Neurological: No headache, dizziness or syncope Musculoskeletal: No new muscle or joint pain Hematologic: No bleeding or bruising    Physical Exam:  Vital signs: There were no vitals taken for this visit.  Constitutional: NAD, Well developed, Well nourished, alert and cooperative Head:  Normocephalic and atraumatic.  Eyes: No scleral icterus. Conjunctiva pink. Mouth: No oral lesions. Respiratory: Respirations even and unlabored. Lungs clear to auscultation bilaterally.  No wheezes, crackles, or rhonchi.  Cardiovascular:  Regular rate and rhythm. No murmurs. No peripheral edema. Gastrointestinal:  Soft, nondistended, nontender. No rebound or guarding. Normal bowel sounds. No appreciable masses or hepatomegaly. Rectal:  Not  performed.  Neurologic:  Alert and oriented x4;  grossly normal neurologically.  Skin:   Dry and intact without significant lesions or rashes. Psychiatric: Oriented to person, place and time. Demonstrates good judgement and reason without abnormal affect or behaviors.   RELEVANT LABS AND IMAGING: CBC    Component Value Date/Time   WBC 5.6 12/24/2023 1053   RBC 4.15 12/24/2023 1053   HGB 11.6 (L) 12/24/2023 1053   HGB 12.0 10/12/2021 1045   HCT 35.3 (L) 12/24/2023 1053   HCT  35.0 10/12/2021 1045   PLT 204 12/24/2023 1053   PLT 233 10/12/2021 1045   MCV 85.1 12/24/2023 1053   MCV 84 10/12/2021 1045   MCH 28.0 12/24/2023 1053   MCHC 32.9 12/24/2023 1053   RDW 13.1 12/24/2023 1053   RDW 12.5 10/12/2021 1045   LYMPHSABS 1.1 12/24/2023 1053   MONOABS 0.4 12/24/2023 1053   EOSABS 0.2 12/24/2023 1053   BASOSABS 0.0 12/24/2023 1053    CMP     Component Value Date/Time   NA 140 01/02/2024 1015   NA 142 01/29/2023 0000   K 4.7 01/02/2024 1015   CL 103 01/02/2024 1015   CO2 28 01/02/2024 1015   GLUCOSE 214 (H) 01/02/2024 1015   BUN 13 01/02/2024 1015   BUN 18 01/29/2023 0000   CREATININE 1.01 01/02/2024 1015   CREATININE 1.12 (H) 09/18/2018 0801   CREATININE 0.72 06/20/2011 0925   CALCIUM  9.4 01/02/2024 1015   CALCIUM  9.1 01/02/2024 1015   PROT 6.4 01/02/2024 1015   PROT 6.9 10/12/2021 1045   ALBUMIN 4.3 01/02/2024 1015   ALBUMIN 4.5 10/12/2021 1045   AST 24 01/02/2024 1015   AST 22 09/18/2018 0801   ALT 23 01/02/2024 1015   ALT 24 09/18/2018 0801   ALKPHOS 51 01/02/2024 1015   BILITOT 0.4 01/02/2024 1015   BILITOT 0.4 10/12/2021 1045   BILITOT 0.5 09/18/2018 0801   GFRNONAA >60 12/24/2023 1053   GFRNONAA 52 (L) 09/18/2018 0801   GFRAA >60 09/18/2018 0801     Assessment/Plan:   Assessment & Plan        Camie Furbish, PA-C [facility] 05/07/2024, 1:02 PM  Patient Care Team: Catherine Charlies LABOR, DO as PCP - General (Family Medicine) Lynwood Schilling, MD as PCP - Cardiology (Cardiology) Avram Lupita BRAVO, MD as Consulting Physician (Gastroenterology) Mannam, Praveen, MD as Consulting Physician (Pulmonary Disease) Tommas Pears, MD as Referring Physician (Endocrinology) Duwayne Purchase, MD as Consulting Physician (Orthopedic Surgery) Berniece Reena HERO, RN (Inactive) as Registered Nurse      [1]  Social History Tobacco Use   Smoking status: Never    Passive exposure: Never   Smokeless tobacco: Never   Tobacco comments:    Never smoked 11/12/23  Vaping Use   Vaping status: Never Used  Substance Use Topics   Alcohol use: Not Currently   Drug use: Never

## 2024-05-13 DIAGNOSIS — G4733 Obstructive sleep apnea (adult) (pediatric): Secondary | ICD-10-CM | POA: Diagnosis not present

## 2024-05-13 DIAGNOSIS — H8301 Labyrinthitis, right ear: Secondary | ICD-10-CM | POA: Diagnosis not present

## 2024-05-13 DIAGNOSIS — I10 Essential (primary) hypertension: Secondary | ICD-10-CM

## 2024-05-25 ENCOUNTER — Other Ambulatory Visit: Payer: Self-pay | Admitting: Family Medicine

## 2024-05-25 MED ORDER — PREGABALIN 50 MG PO CAPS: 50.0000 mg | ORAL_CAPSULE | Freq: Two times a day (BID) | ORAL | 1 refills | Status: AC

## 2024-05-25 NOTE — Telephone Encounter (Signed)
 Copied from CRM [id number]. Topic: Clinical - Medication Refill >> May 25, 2024  8:10 AM Aleatha C wrote: Medication: pregabalin  (LYRICA ) 50 MG capsule  Has the patient contacted their pharmacy? Yes (Agent: If no, request that the patient contact the pharmacy for the refill. If patient does not wish to contact the pharmacy document the reason why and proceed with request.) (Agent: If yes, when and what did the pharmacy advise?)  This is the patient's preferred pharmacy:    [facility] [phone] - [place], [place] - [address] [place] [place]  [place] [address] Phone: [phone] Fax: [phone]  Is this the correct pharmacy for this prescription? Yes If no, delete pharmacy and type the correct one.   Has the prescription been filled recently? No  Is the patient out of the medication? Yes  Has the patient been seen for an appointment in the last year OR does the patient have an upcoming appointment? Yes  Can we respond through MyChart? No  Agent: Please be advised that Rx refills may take up to 3 business days. We ask that you follow-up with your pharmacy.

## 2024-05-25 NOTE — Telephone Encounter (Signed)
 Received refill request for Lyrica . This medication had been refilled , but sent  to her mail in pharmacy.  I have moved it to her requested local lx  Please make her aware

## 2024-05-28 ENCOUNTER — Other Ambulatory Visit: Payer: Self-pay | Admitting: Cardiology

## 2024-05-28 MED ORDER — ISOSORBIDE MONONITRATE ER 30 MG PO TB24: 30.0000 mg | ORAL_TABLET | Freq: Every day | ORAL | 0 refills | Status: AC

## 2024-06-02 ENCOUNTER — Ambulatory Visit

## 2024-06-02 VITALS — BP 145/74 | HR 68 | Ht 64.25 in | Wt 183.0 lb

## 2024-06-02 DIAGNOSIS — Z0000 Encounter for general adult medical examination without abnormal findings: Secondary | ICD-10-CM

## 2024-06-02 NOTE — Progress Notes (Signed)
 "  Chief Complaint  Patient presents with   Medicare Wellness     Subjective:   [name] [name] is a 69 y.o. female who presents for a Medicare Annual Wellness Visit.  Visit info / Clinical Intake: Medicare Wellness Visit Type:: Subsequent Annual Wellness Visit Persons participating in visit and providing information:: patient Medicare Wellness Visit Mode:: In-person (required for WTM) Interpreter Needed?: No Pre-visit prep was completed: yes AWV questionnaire completed by patient prior to visit?: yes Date:: 05/29/24 Living arrangements:: (Patient-Rptd) lives with spouse/significant other Patient's Overall Health Status Rating: (Patient-Rptd) good Typical amount of pain: (Patient-Rptd) some Does pain affect daily life?: (Patient-Rptd) no Are you currently prescribed opioids?: no  Dietary Habits and Nutritional Risks How many meals a day?: (Patient-Rptd) 3 Eats fruit and vegetables daily?: (!) (Patient-Rptd) no Most meals are obtained by: (Patient-Rptd) preparing own meals; eating out In the last 2 weeks, have you had any of the following?: none Diabetic:: (!) yes Any non-healing wounds?: no How often do you check your BS?: continuous glucose monitor Would you like to be referred to a Nutritionist or for Diabetic Management? : no  Functional Status Activities of Daily Living (to include ambulation/medication): (Patient-Rptd) Independent Ambulation: Independent with device- listed below Home Assistive Devices/Equipment: Eyeglasses Medication Administration: (Patient-Rptd) Independent Home Management (perform basic housework or laundry): (Patient-Rptd) Independent Manage your own finances?: (Patient-Rptd) yes Primary transportation is: (Patient-Rptd) driving Concerns about vision?: no *vision screening is required for WTM* Concerns about hearing?: (!) yes Uses hearing aids?: (!) yes Hear whispered voice?: yes  Fall Screening Falls in the past year?: (Patient-Rptd)  0 Number of falls in past year: 0 Was there an injury with Fall?: 0 Fall Risk Category Calculator: 0 Patient Fall Risk Level: Low Fall Risk  Fall Risk Patient at Risk for Falls Due to: No Fall Risks Fall risk Follow up: Falls evaluation completed; Falls prevention discussed  Home and Transportation Safety: All rugs have non-skid backing?: (Patient-Rptd) N/A, no rugs All stairs or steps have railings?: (Patient-Rptd) N/A, no stairs Grab bars in the bathtub or shower?: (Patient-Rptd) yes Have non-skid surface in bathtub or shower?: (!) (Patient-Rptd) no Good home lighting?: (Patient-Rptd) yes Regular seat belt use?: (Patient-Rptd) yes Hospital stays in the last year:: (Patient-Rptd) no  Cognitive Assessment Difficulty concentrating, remembering, or making decisions? : (Patient-Rptd) no Will 6CIT or Mini Cog be Completed: yes 6CIT or Mini Cog Declined: patient alert, oriented, able to answer questions appropriately and recall recent events What year is it?: 0 points What month is it?: 0 points Give patient an address phrase to remember (5 components): [address], [place], [place] About what time is it?: 0 points Count backwards from 20 to 1: 0 points Say the months of the year in reverse: 0 points Repeat the address phrase from earlier: 0 points 6 CIT Score: 0 points  Advance Directives (For Healthcare) Does Patient Have a Medical Advance Directive?: Yes Type of Advance Directive: Healthcare Power of [place]; Living will Copy of Healthcare Power of Attorney in Chart?: No - copy requested Copy of Living Will in Chart?: No - copy requested  Reviewed/Updated  Reviewed/Updated: Reviewed All (Medical, Surgical, Family, Medications, Allergies, Care Teams, Patient Goals)    Allergies (verified) Epinephrine , Other, Statins, Welchol  [colesevelam  hcl], and Amoxicillin   Current Medications (verified) Outpatient Encounter Medications as of 06/02/2024  Medication Sig   Calcium  Carbonate  (CALTRATE 600 PO) Take 650 mg by mouth daily.   Carboxymeth-Glyc-Polysorb PF (REFRESH OPTIVE MEGA-3) 0.5-1-0.5 % SOLN Place  1 drop into both eyes 3 (three) times daily.   Cyanocobalamin  (VITAMIN B12) 1000 MCG TBCR 1 tablet Orally Once a day; Duration: 30 day(s)   dabigatran  (PRADAXA ) 150 MG CAPS capsule Take 1 capsule (150 mg total) by mouth 2 (two) times daily.   ezetimibe  (ZETIA ) 10 MG tablet Take 10 mg by mouth daily.   fenofibrate  160 MG tablet TAKE 1 TABLET DAILY   fexofenadine (ALLEGRA) 180 MG tablet Take 180 mg by mouth daily.   Inclisiran Sodium  (LEQVIO  Ali Chukson) Inject into the skin.   isosorbide  mononitrate (IMDUR ) 30 MG 24 hr tablet Take 1 tablet (30 mg total) by mouth daily.   ketoconazole (NIZORAL) 2 % shampoo Apply 1 Application topically 2 (two) times a week.   levothyroxine  (SYNTHROID ) 100 MCG tablet Take 100 mcg by mouth daily before breakfast.   Magnesium  250 MG TABS Take 250 mg by mouth daily.   metFORMIN  (GLUCOPHAGE -XR) 500 MG 24 hr tablet Take 500 mg by mouth 2 (two) times daily.   metoprolol  tartrate (LOPRESSOR ) 50 MG tablet Take 1 tablet (50 mg total) by mouth 2 (two) times daily.   nitroGLYCERIN  (NITROSTAT ) 0.4 MG SL tablet Place 1 tablet (0.4 mg total) under the tongue every 5 (five) minutes as needed for chest pain.   NOVOLIN 70/30 KWIKPEN (70-30) 100 UNIT/ML KwikPen Inject 14-25 Units into the skin See admin instructions. Take 24 mg in the morning 14 mg at lunch and 25 mg a bedtime   pregabalin  (LYRICA ) 50 MG capsule Take 1 capsule (50 mg total) by mouth 2 (two) times daily.   Probiotic Product (PROBIOTIC DAILY PO) Take 1 tablet by mouth daily.   valsartan  (DIOVAN ) 40 MG tablet Take 1 tablet (40 mg total) by mouth daily.   pantoprazole  (PROTONIX ) 40 MG tablet Take 1 tablet (40 mg total) by mouth 2 (two) times daily before a meal. (Patient not taking: Reported on 06/02/2024)   No facility-administered encounter medications on file as of 06/02/2024.    History: Past Medical  History:  Diagnosis Date   Accelerating angina (HCC) 11/04/2017   Allergy     Asthma    Atrial fibrillation (HCC) 08/27/2020   Bone spur 10/11/2021   Cataract    Chronic kidney disease    Chronic pain disorder    neck and shoulder   Chronic pain of right upper extremity 11/10/2017   Coronary artery disease 2019   Stent x2   CTS (carpal tunnel syndrome) 01/27/2013   right   Dextroscoliosis of thoracic spine 10/11/2021   Diabetes mellitus type II    Diabetic gastroparesis (HCC)    Dysrhythmia    A. Fib   Esophageal reflux 08/25/2013   [place], Dr Norvell   FATTY LIVER DISEASE 04/27/2007   Qualifier: Diagnosis of  By: Anice CMA, Darlene     Focal muscle atrophy 09/26/2015   Fundic gland polyps of stomach, benign    Globus sensation 04/13/2018   Hordeolum externum of right upper eyelid 12/24/2021   HTN (hypertension)    Hx of adenomatous colonic polyps 12/10/2021   Hyperlipidemia, mixed 05/04/2007   Qualifier: Diagnosis of  By: Georgian ROSALEA CHARM Lamar crestor  caused myalgias even at low dose Livalo  caused myalgias Lipitor  Mother with severe reaction myalgias Simvastatin, Welchol     Hypokalemia 05/25/2013   Improved stopping HCTZ. Was noted to have an elevated glucose when K was low. Recheck renal next week after starting Maxzide   Hypomagnesemia 08/10/2014   Hypothyroidism    Iron  deficiency anemia  08/07/2018   Iron  malabsorption 08/07/2018   Laryngitis 08/25/2017   Leg cramps 04/15/2016   Nausea without vomiting 08/08/2014   Neurosensory deficit 10/26/2021   NONSPEC ELEVATION OF LEVELS OF TRANSAMINASE/LDH 05/01/2007   Qualifier: Diagnosis of  By: Eyvonne Jacob     Osteoarthritis    chronic, right knee (11/04/2017)   Plantar fasciitis    Pneumonia    Rosacea 10/22/2016   Urine incontinence    Past Surgical History:  Procedure Laterality Date   ANTERIOR CERVICAL DECOMP/DISCECTOMY FUSION N/A 11/25/2022   Procedure: ANTERIOR CERVICAL DISCECTOMY AND FUSION CERVICAL  FIVE TO SEVEN;  Surgeon: Burnetta Aures, MD;  Location: [facility] OR;  Service: Orthopedics;  Laterality: N/A;   AUGMENTATION MAMMAPLASTY Bilateral 2002   BLADDER SUSPENSION  1981   CATARACT EXTRACTION W/ INTRAOCULAR LENS IMPLANT Bilateral    COLONOSCOPY     CORONARY PRESSURE/FFR STUDY N/A 11/04/2017   Procedure: INTRAVASCULAR PRESSURE WIRE/FFR STUDY;  Surgeon: Mady Bruckner, MD;  Location: [facility] INVASIVE CV LAB;  Service: Cardiovascular;  Laterality: N/A;   CORONARY STENT INTERVENTION N/A 11/04/2017   Procedure: CORONARY STENT INTERVENTION;  Surgeon: Mady Bruckner, MD;  Location: [facility] INVASIVE CV LAB;  Service: Cardiovascular;  Laterality: N/A;   CORONARY ULTRASOUND/IVUS N/A 11/04/2017   Procedure: Intravascular Ultrasound/IVUS;  Surgeon: Mady Bruckner, MD;  Location: [facility] INVASIVE CV [facility];  Service: Cardiovascular;  Laterality: N/A;   ENDOVENOUS ABLATION SAPHENOUS VEIN W/ LASER Left 12/29/2019   endovenous laser ablation left greater saphenous vein by Carlin Haddock MD    ESOPHAGOGASTRODUODENOSCOPY     KNEE ARTHROSCOPY Right 1998, [id number]   LAPAROSCOPIC CHOLECYSTECTOMY  2007   LEFT HEART CATH AND CORONARY ANGIOGRAPHY N/A 11/04/2017   Procedure: LEFT HEART CATH AND CORONARY ANGIOGRAPHY;  Surgeon: Mady Bruckner, MD;  Location: [facility]VAS[facility];  Service: Cardiovascular;  Laterality: N/A;   LEFT HEART CATHETERIZATION WITH CORONARY ANGIOGRAM N/A 11/21/2011   Procedure: LEFT HEART CATHETERIZATION WITH CORONARY ANGIOGRAM;  Surgeon: Maude JAYSON Emmer, MD;  Location: [facility] CATH LAB;  Service: Cardiovascular;  Laterality: N/A;   TONSILLECTOMY  1975   VAGINAL HYSTERECTOMY  1983   still have my ovaries   Family History  Problem Relation Age of Onset   Alzheimer's disease Mother    Diabetes type II Mother    Hiatal hernia Mother    Diabetes Mother    Emphysema Father    COPD Father    Depression Sister        suicide   Diabetes Sister    Stroke Maternal Grandfather    Diabetes Daughter     Colon cancer Neg Hx    Stomach cancer Neg Hx    Esophageal cancer Neg Hx    Rectal cancer Neg Hx    Seizures Neg Hx    Social History   Occupational History   Occupation: RETIRED/[employer]  Tobacco Use   Smoking status: Never    Passive exposure: Never   Smokeless tobacco: Never   Tobacco comments:    Never smoked 11/12/23  Vaping Use   Vaping status: Never Used  Substance and Sexual Activity   Alcohol use: Not Currently   Drug use: Never   Sexual activity: Not Currently    Partners: Male   Tobacco Counseling Counseling given: Not Answered Tobacco comments: Never smoked 11/12/23  SDOH Screenings   Food Insecurity: No Food Insecurity (05/29/2024)  Housing: Unknown (05/29/2024)  Transportation Needs: No Transportation Needs (05/29/2024)  Utilities: Not At Risk (06/02/2024)  Alcohol Screen: Low Risk (04/16/2023)  Depression (PHQ2-9): Low Risk (06/02/2024)  Financial Resource Strain: Low Risk (05/29/2024)  Physical Activity: Inactive (05/29/2024)  Social Connections: Moderately Isolated (05/29/2024)  Stress: No Stress Concern Present (05/29/2024)  Tobacco Use: Low Risk (06/02/2024)  Health Literacy: Adequate Health Literacy (06/02/2024)   See flowsheets for full screening details  Depression Screen PHQ 2 & 9 Depression Scale- Over the past 2 weeks, how often have you been bothered by any of the following problems? Little interest or pleasure in doing things: 0 Feeling down, depressed, or hopeless (PHQ Adolescent also includes...irritable): 1 PHQ-2 Total Score: 1 Trouble falling or staying asleep, or sleeping too much: 0 Feeling tired or having little energy: 0 Poor appetite or overeating (PHQ Adolescent also includes...weight loss): 0 Feeling bad about yourself - or that you are a failure or have let yourself or your family down: 0 Trouble concentrating on things, such as reading the newspaper or watching television (PHQ Adolescent also includes...like school work):  0 Moving or speaking so slowly that other people could have noticed. Or the opposite - being so fidgety or restless that you have been moving around a lot more than usual: 0 Thoughts that you would be better off dead, or of hurting yourself in some way: 0 PHQ-9 Total Score: 1 If you checked off any problems, how difficult have these problems made it for you to do your work, take care of things at home, or get along with other people?: Not difficult at all  Depression Treatment Depression Interventions/Treatment : EYV7-0 Score <4 Follow-up Not Indicated     Goals Addressed             This Visit's Progress    Patient Stated   On track    Lose weight              Objective:    Today's Vitals   06/02/24 0807  BP: (!) 145/74  Pulse: 68  SpO2: 96%  Weight: 183 lb (83 kg)  Height: 5' 4.25 (1.632 m)   Body mass index is 31.17 kg/m.  Hearing/Vision screen Hearing Screening - Comments:: Wears hearing aides Vision Screening - Comments:: Wears eyeglasses/UTD/[place] Immunizations and Health Maintenance Health Maintenance  Topic Date Due   DTaP/Tdap/Td (2 - Td or Tdap) 09/17/2021   OPHTHALMOLOGY EXAM  04/27/2023   HEMOGLOBIN A1C  08/04/2024   Diabetic kidney evaluation - eGFR measurement  01/01/2025   FOOT EXAM  01/12/2025   Diabetic kidney evaluation - Urine ACR  02/04/2025   Medicare Annual Wellness (AWV)  06/02/2025   Mammogram  03/22/2026   Colonoscopy  12/04/2028   Pneumococcal Vaccine: 50+ Years  Completed   Influenza Vaccine  Completed   Bone Density Scan  Completed   Hepatitis C Screening  Completed   Zoster Vaccines- Shingrix  Completed   Meningococcal B Vaccine  Aged Out   COVID-19 Vaccine  Discontinued        Assessment/Plan:  This is a routine wellness examination for Mildred.  Patient Care Team: Catherine Charlies LABOR, DO as PCP - General (Family Medicine) Lavona Agent, MD as PCP - Cardiology (Cardiology) Avram Lupita BRAVO, MD as Consulting Physician  (Gastroenterology) Mannam, Praveen, MD as Consulting Physician (Pulmonary Disease) Tommas Pears, MD as Referring Physician (Endocrinology) Duwayne Purchase, MD as Consulting Physician (Orthopedic Surgery) Berniece Reena HERO, RN (Inactive) as Registered Nurse  I have personally reviewed and noted the following in the patients chart:   Medical and social history Use of alcohol, tobacco or illicit drugs  Current  medications and supplements including opioid prescriptions. Functional ability and status Nutritional status Physical activity Advanced directives List of other physicians Hospitalizations, surgeries, and ER visits in previous 12 months Vitals Screenings to include cognitive, depression, and falls Referrals and appointments  No orders of the defined types were placed in this encounter.  In addition, I have reviewed and discussed with patient certain preventive protocols, quality metrics, and best practice recommendations. A written personalized care plan for preventive services as well as general preventive health recommendations were provided to patient.   Elisabella Hacker L Day Greb, CMA   06/02/2024   Return in 1 year (on 06/02/2025).  After Visit Summary: (MyChart) Due to this being a telephonic visit, the after visit summary with patients personalized plan was offered to patient via MyChart   Nurse Notes: Patient is due for a Tdap vaccine.  She had no other concerns to address today. "

## 2024-06-02 NOTE — Patient Instructions (Addendum)
 [name] [name],  Thank you for taking the time for your Medicare Wellness Visit. I appreciate your continued commitment to your health goals. Please review the care plan we discussed, and feel free to reach out if I can assist you further.  Please note that Annual Wellness Visits do not include a physical exam. Some assessments may be limited, especially if the visit was conducted virtually. If needed, we may recommend an in-person follow-up with your provider.  Ongoing Care Seeing your primary care provider every 3 to 6 months helps us  monitor your health and provide consistent, personalized care. Next office visit on 07/28/2024.  You are due for a tetanus vaccine and can get that done at your local pharmacy.  Aim for 30 minutes of exercise or brisk walking, 6-8 glasses of water, and 5 servings of fruits and vegetables each day.   Referrals If a referral was made during today's visit and you haven't received any updates within two weeks, please contact the referred provider directly to check on the status.  Recommended Screenings:  Health Maintenance  Topic Date Due   DTaP/Tdap/Td vaccine (2 - Td or Tdap) 09/17/2021   Eye exam for diabetics  04/27/2023   Medicare Annual Wellness Visit  04/15/2024   Hemoglobin A1C  08/04/2024   Yearly kidney function blood test for diabetes  01/01/2025   Complete foot exam   01/12/2025   Yearly kidney health urinalysis for diabetes  02/04/2025   Breast Cancer Screening  03/22/2026   Colon Cancer Screening  12/04/2028   Pneumococcal Vaccine for age over 72  Completed   Flu Shot  Completed   Osteoporosis screening with Bone Density Scan  Completed   Hepatitis C Screening  Completed   Zoster (Shingles) Vaccine  Completed   Meningitis B Vaccine  Aged Out   COVID-19 Vaccine  Discontinued       05/29/2024   12:07 PM  Advanced Directives  Does Patient Have a Medical Advance Directive? Yes  Type of Estate Agent of [place];Living will   Copy of Healthcare Power of Attorney in Chart? No - copy requested    Vision: Annual vision screenings are recommended for early detection of glaucoma, cataracts, and diabetic retinopathy. These exams can also reveal signs of chronic conditions such as diabetes and high blood pressure.  Dental: Annual dental screenings help detect early signs of oral cancer, gum disease, and other conditions linked to overall health, including heart disease and diabetes.  Please see the attached documents for additional preventive care recommendations.

## 2024-06-04 ENCOUNTER — Telehealth: Payer: Self-pay

## 2024-06-04 NOTE — Telephone Encounter (Signed)
 Auth Submission: NO AUTH NEEDED Site of care: Site of care: [facility] Payer: Medicare A/B with [employer] supplement Medication & CPT/J Code(s) submitted: Leqvio  (Inclisiran) J1306 Diagnosis Code:  Route of submission (phone, fax, portal):  Phone # Fax # Auth type: Buy/Bill PB Units/visits requested: 284mg  x 2 doses Reference number:  Approval from: 06/04/24 to 06/26/25

## 2024-06-09 ENCOUNTER — Ambulatory Visit (HOSPITAL_COMMUNITY)
Admission: RE | Admit: 2024-06-09 | Discharge: 2024-06-09 | Disposition: A | Discharge status: Home / Self Care | Source: Ambulatory Visit | Attending: Physician Assistant | Admitting: Physician Assistant

## 2024-06-09 VITALS — BP 140/70 | HR 65 | Ht 64.25 in | Wt 179.4 lb

## 2024-06-09 DIAGNOSIS — I480 Paroxysmal atrial fibrillation: Secondary | ICD-10-CM | POA: Insufficient documentation

## 2024-06-09 DIAGNOSIS — I10 Essential (primary) hypertension: Secondary | ICD-10-CM | POA: Insufficient documentation

## 2024-06-09 DIAGNOSIS — I4891 Unspecified atrial fibrillation: Secondary | ICD-10-CM | POA: Diagnosis not present

## 2024-06-09 DIAGNOSIS — D6869 Other thrombophilia: Secondary | ICD-10-CM | POA: Diagnosis present

## 2024-06-09 MED ORDER — VALSARTAN 80 MG PO TABS: 80.0000 mg | ORAL_TABLET | Freq: Every day | ORAL | 0 refills | Status: DC

## 2024-06-09 NOTE — Progress Notes (Signed)
 "   Primary Care Physician: Catherine Charlies LABOR, DO Primary Cardiologist: Lynwood Schilling, MD Electrophysiologist: None  Referring Physician: Dr Schilling Arland JINNY [name] [name] is a 69 y.o. female with a history of CAD, HTN, DM, HLD, atrial fibrillation who presents for follow up in the [facility].  The patient was initially diagnosed with atrial fibrillation 08/2020 after presenting to the ED with symptoms of chest and jaw pain. Durin that admission she went into rapid afib with symptoms of palpitations, fatigue, and chest pain. Zio patch after discharge did not show any recurrent afib. Patient is on Pradaxa  for stroke prevention. She was seen 11/12/23 with increased palpitations, BB increased.    Patient returns for follow up for atrial fibrillation. She remains in SR today and feels well. She denies any significant afib or palpitations since her last visit. No bleeding issues on anticoagulation. She has noted elevated BP readings at home and at other doctor visits.   Today, she  denies symptoms of palpitations, chest pain, shortness of breath, orthopnea, PND, lower extremity edema, dizziness, presyncope, syncope, snoring, daytime somnolence, bleeding, or neurologic sequela. The patient is tolerating medications without difficulties and is otherwise without complaint today.    Atrial Fibrillation Risk Factors:  she does not have symptoms or diagnosis of sleep apnea. she does not have a history of rheumatic fever. she does not have a history of alcohol use. The patient does not have a history of early familial atrial fibrillation or other arrhythmias.   Atrial Fibrillation Management history:  Previous antiarrhythmic drugs: none Previous cardioversions: none Previous ablations: none Anticoagulation history: Eliquis , Pradaxa   ROS- All systems are reviewed and negative except as per the HPI above.  Past Medical History:  Diagnosis Date   Accelerating angina (HCC)  11/04/2017   Allergy     Asthma    Atrial fibrillation (HCC) 08/27/2020   Bone spur 10/11/2021   Cataract    Chronic kidney disease    Chronic pain disorder    neck and shoulder   Chronic pain of right upper extremity 11/10/2017   Coronary artery disease 2019   Stent x2   CTS (carpal tunnel syndrome) 01/27/2013   right   Dextroscoliosis of thoracic spine 10/11/2021   Diabetes mellitus type II    Diabetic gastroparesis (HCC)    Dysrhythmia    A. Fib   Esophageal reflux 08/25/2013   [place], Dr Norvell   FATTY LIVER DISEASE 04/27/2007   Qualifier: Diagnosis of  By: Anice CMA, Darlene     Focal muscle atrophy 09/26/2015   Fundic gland polyps of stomach, benign    Globus sensation 04/13/2018   Hordeolum externum of right upper eyelid 12/24/2021   HTN (hypertension)    Hx of adenomatous colonic polyps 12/10/2021   Hyperlipidemia, mixed 05/04/2007   Qualifier: Diagnosis of  By: Georgian ROSALEA CHARM Lamar crestor  caused myalgias even at low dose Livalo  caused myalgias Lipitor  Mother with severe reaction myalgias Simvastatin, Welchol     Hypokalemia 05/25/2013   Improved stopping HCTZ. Was noted to have an elevated glucose when K was low. Recheck renal next week after starting Maxzide   Hypomagnesemia 08/10/2014   Hypothyroidism    Iron  deficiency anemia 08/07/2018   Iron  malabsorption 08/07/2018   Laryngitis 08/25/2017   Leg cramps 04/15/2016   Nausea without vomiting 08/08/2014   Neurosensory deficit 10/26/2021   NONSPEC ELEVATION OF LEVELS OF TRANSAMINASE/LDH 05/01/2007   Qualifier: Diagnosis of  By: Eyvonne Jacob  Osteoarthritis    chronic, right knee (11/04/2017)   Plantar fasciitis    Pneumonia    Rosacea 10/22/2016   Urine incontinence     Current Outpatient Medications  Medication Sig Dispense Refill   Calcium  Carbonate (CALTRATE 600 PO) Take 650 mg by mouth daily.     Carboxymeth-Glyc-Polysorb PF (REFRESH OPTIVE MEGA-3) 0.5-1-0.5 % SOLN Place 1 drop into  both eyes 3 (three) times daily.     cholecalciferol (VITAMIN D3) 25 MCG (1000 UNIT) tablet Take 1,000 Units by mouth daily.     Cyanocobalamin  (VITAMIN B12) 1000 MCG TBCR 1 tablet Orally Once a day; Duration: 30 day(s)     dabigatran  (PRADAXA ) 150 MG CAPS capsule Take 1 capsule (150 mg total) by mouth 2 (two) times daily. 180 capsule 3   ezetimibe  (ZETIA ) 10 MG tablet Take 10 mg by mouth daily.     fenofibrate  160 MG tablet TAKE 1 TABLET DAILY 90 tablet 3   fexofenadine (ALLEGRA) 180 MG tablet Take 180 mg by mouth daily.     fluocinonide ointment (LIDEX) 0.05 % Apply 1 Application topically as needed.     Inclisiran Sodium  (LEQVIO  [place]) Inject into the skin.     isosorbide  mononitrate (IMDUR ) 30 MG 24 hr tablet Take 1 tablet (30 mg total) by mouth daily. 90 tablet 0   ketoconazole (NIZORAL) 2 % shampoo Apply 1 Application topically 2 (two) times a week.     levothyroxine  (SYNTHROID ) 100 MCG tablet Take 100 mcg by mouth daily before breakfast.     Magnesium  250 MG TABS Take 250 mg by mouth daily.     metFORMIN  (GLUCOPHAGE -XR) 500 MG 24 hr tablet Take 500 mg by mouth 2 (two) times daily.     metoprolol  tartrate (LOPRESSOR ) 50 MG tablet Take 1 tablet (50 mg total) by mouth 2 (two) times daily. 60 tablet 2   nitroGLYCERIN  (NITROSTAT ) 0.4 MG SL tablet Place 1 tablet (0.4 mg total) under the tongue every 5 (five) minutes as needed for chest pain. 25 tablet PRN   NOVOLIN 70/30 KWIKPEN (70-30) 100 UNIT/ML KwikPen Inject 14-25 Units into the skin See admin instructions. Take 24 mg in the morning 14 mg at lunch and 25 mg a bedtime     pantoprazole  (PROTONIX ) 40 MG tablet Take 1 tablet (40 mg total) by mouth 2 (two) times daily before a meal. 180 tablet 0   pregabalin  (LYRICA ) 50 MG capsule Take 1 capsule (50 mg total) by mouth 2 (two) times daily. 180 capsule 1   Probiotic Product (PROBIOTIC DAILY PO) Take 1 tablet by mouth daily.     triamcinolone  ointment (KENALOG ) 0.1 % Apply 1 Application topically as  needed.     valsartan  (DIOVAN ) 80 MG tablet Take 1 tablet (80 mg total) by mouth daily. 30 tablet 0   No current facility-administered medications for this encounter.    Physical Exam: BP (!) 140/70   Pulse 65   Ht 5' 4.25 (1.632 m)   Wt 81.4 kg   BMI 30.55 kg/m   GEN: Well nourished, well developed in no acute distress CARDIAC: Regular rate and rhythm, no murmurs, rubs, gallops RESPIRATORY:  Clear to auscultation without rales, wheezing or rhonchi  ABDOMEN: Soft, non-tender, non-distended EXTREMITIES:  No edema; No deformity    Wt Readings from Last 3 Encounters:  06/09/24 81.4 kg  06/02/24 83 kg  03/11/24 83.1 kg     EKG Interpretation Date/Time:  Wednesday June 09 2024 09:11:25 EST Ventricular Rate:  65 PR Interval:  164 QRS Duration:  72 QT Interval:  400 QTC Calculation: 416 R Axis:   73  Text Interpretation: Normal sinus rhythm Normal ECG When compared with ECG of 24-Dec-2023 12:57, No significant change was found Confirmed by Brenlyn Beshara ([id number]) on 06/09/2024 9:46:51 AM    Echo 08/27/20 demonstrated   1. Left ventricular ejection fraction, by estimation, is 65 to 70%. The  left ventricle has normal function. The left ventricle has no regional  wall motion abnormalities. There is mild left ventricular hypertrophy.  Left ventricular diastolic parameters are consistent with Grade I diastolic dysfunction (impaired relaxation).   2. Right ventricular systolic function is normal. The right ventricular  size is normal.   3. The mitral valve is normal in structure. Trivial mitral valve  regurgitation. No evidence of mitral stenosis.   4. The aortic valve is normal in structure. Aortic valve regurgitation is  not visualized. No aortic stenosis is present.   Comparison(s): A prior study was performed on 10/07/2017. No significant  change from prior study. Prior images reviewed side by side. Trivial  pericaridal effusion with prominent epicardial adipose layer is  unchanged.    CHA2DS2-VASc Score = 5  The patient's score is based upon: CHF History: 0 HTN History: 1 Diabetes History: 1 Stroke History: 0 Vascular Disease History: 1 Age Score: 1 Gender Score: 1       ASSESSMENT AND PLAN: Paroxysmal Atrial Fibrillation (ICD10:  I48.0) The patient's CHA2DS2-VASc score is 5, indicating a 7.2% annual risk of stroke.   Patient appears to be maintaining SR Continue Lopressor  50 mg BID Continue Pradaxa  150 mg BID  Secondary Hypercoagulable State (ICD10:  D68.69) The patient is at significant risk for stroke/thromboembolism based upon her CHA2DS2-VASc Score of 5.  Continue Dabigatran  (Pradaxa ). No bleeding issues.   HTN Borderline today but elevated at home and at other visits.  Will increase valsartan  to 80 mg daily Recheck bmet in two weeks Follow up with PCP for further titration.   CAD No anginal symptoms Followed by Dr Lavona    Follow up in the AF clinic in one year.    [facility] [address] [place], [place] [address] [phone] "

## 2024-06-09 NOTE — Patient Instructions (Signed)
 Increase valsartan  to 80mg  once a day - follow up with primary care for further adjustments/refills of this.  Have labwork drawn in 2 weeks to follow up medication adjustment - orders attached - can go to any [facility] week of 1/26   Follow up in 1 year

## 2024-06-16 ENCOUNTER — Ambulatory Visit: Admitting: Gastroenterology

## 2024-06-21 ENCOUNTER — Other Ambulatory Visit: Payer: Self-pay

## 2024-06-22 ENCOUNTER — Ambulatory Visit: Admitting: Family Medicine

## 2024-06-22 ENCOUNTER — Encounter: Payer: Self-pay | Admitting: Family Medicine

## 2024-06-22 VITALS — BP 122/74 | HR 72 | Temp 98.0°F | Wt 175.6 lb

## 2024-06-22 DIAGNOSIS — R051 Acute cough: Secondary | ICD-10-CM

## 2024-06-22 DIAGNOSIS — J019 Acute sinusitis, unspecified: Secondary | ICD-10-CM | POA: Diagnosis not present

## 2024-06-22 DIAGNOSIS — B9689 Other specified bacterial agents as the cause of diseases classified elsewhere: Secondary | ICD-10-CM | POA: Diagnosis not present

## 2024-06-22 MED ORDER — DOXYCYCLINE HYCLATE 100 MG PO TABS: 100.0000 mg | ORAL_TABLET | Freq: Two times a day (BID) | ORAL | 0 refills | Status: AC

## 2024-06-22 MED ORDER — BENZONATATE 200 MG PO CAPS: 200.0000 mg | ORAL_CAPSULE | Freq: Two times a day (BID) | ORAL | 0 refills | Status: AC | PRN

## 2024-06-22 MED ORDER — IPRATROPIUM BROMIDE 0.06 % NA SOLN: 2.0000 | Freq: Four times a day (QID) | NASAL | 2 refills | Status: AC

## 2024-06-22 MED ORDER — METHYLPREDNISOLONE ACETATE 80 MG/ML IJ SUSP
80.0000 mg | Freq: Once | INTRAMUSCULAR | Status: AC
  Administered 2024-06-22: 80 mg via INTRAMUSCULAR

## 2024-06-22 NOTE — Patient Instructions (Addendum)
 Return if symptoms worsen or fail to improve.        Great to see you today.  I have refilled the medication(s) we provide.   If labs were collected or images ordered, we will inform you of  results once we have received them and reviewed. We will contact you either by echart message, or telephone call.  Please give ample time to the testing facility, and our office to run,  receive and review results. Please do not call inquiring of results, even if you can see them in your chart. We will contact you as soon as we are able. If it has been over 1 week since the test was completed, and you have not yet heard from us , then please call us .    - echart message- for normal results that have been seen by the patient already.   - telephone call: abnormal results or if patient has not viewed results in their echart.  If a referral to a specialist was entered for you, please call us  in 2 weeks if you have not heard from the specialist office to schedule.

## 2024-06-22 NOTE — Addendum Note (Signed)
 Addended by: GEORGEAN BEEN A on: 06/22/2024 11:19 AM   Modules accepted: Orders

## 2024-06-22 NOTE — Progress Notes (Signed)
 "      [name] [name] , [birth year], 69 y.o., female MRN: [id number] Patient Care Team    Relationship Specialty Notifications Start End  Catherine Charlies LABOR, DO PCP - General Family Medicine  06/03/18   Lavona Agent, MD PCP - Cardiology Cardiology Admissions 11/14/17   Avram Lupita BRAVO, MD Consulting Physician Gastroenterology  06/05/18   Mannam, Praveen, MD Consulting Physician Pulmonary Disease  06/05/18   Tommas Pears, MD Referring Physician Endocrinology  09/07/20   Duwayne Purchase, MD Consulting Physician Orthopedic Surgery  10/31/21   Berniece Reena HERO, RN (Inactive) Registered Nurse   02/27/22     Chief Complaint  Patient presents with   Cough    Over a week; cough, drainage, eye irritation. Denies fever. Pt has been taking leftover prescription of Promethazine .      Subjective: [name] [name] is a 69 y.o. Pt presents for an OV with complaints of cold-like symptoms of greater than 10 days duration.  Associated symptoms include cough, nasal drainage, bilateral eye irritation. Pt has tried promethazine  to ease their symptoms.  She reports she has taken Allegra daily.       06/02/2024    8:24 AM 12/30/2023    8:16 AM 04/16/2023    3:43 PM 04/11/2022    2:16 PM 04/04/2021   11:48 AM  Depression screen PHQ 2/9  Decreased Interest 0 2 0 0 0  Down, Depressed, Hopeless 1 3 0 0 0  PHQ - 2 Score 1 5 0 0 0  Altered sleeping 0 0 0    Tired, decreased energy 0 3 0    Change in appetite 0 0 0    Feeling bad or failure about yourself  0 0 0    Trouble concentrating 0 3 0    Moving slowly or fidgety/restless 0 0 0    Suicidal thoughts 0 0 0    PHQ-9 Score 1 11  0     Difficult doing work/chores Not difficult at all Somewhat difficult Not difficult at all       Data saved with a previous flowsheet row definition      11/01/2022    8:55 PM 02/07/2023    9:48 AM 04/16/2023    3:35 PM 12/30/2023    8:16 AM 05/29/2024   12:07 PM  Fall Risk  Falls in the past year? 0  0 0 0 0   Was there an  injury with Fall?  0  0   0  Fall Risk Category Calculator  0 0  0  Patient at Risk for Falls Due to     No Fall Risks  Fall risk Follow up  Falls evaluation completed Falls evaluation completed;Education provided;Falls prevention discussed Falls evaluation completed Falls evaluation completed;Falls prevention discussed     Manually entered by patient   Data saved with a previous flowsheet row definition     Allergies[1] Social History   Social History Narrative   Marital status/children/pets: Married, 1 child.    Education/employment: HS   Safety:      -smoke alarm in the home:Yes     - wears seatbelt: Yes     - Feels safe in their relationships: Yes   Lives with husband/2026   Past Medical History:  Diagnosis Date   Accelerating angina (HCC) 11/04/2017   Allergy     Asthma    Atrial fibrillation (HCC) 08/27/2020   Bone spur 10/11/2021   Cataract    Chronic kidney disease  Chronic pain disorder    neck and shoulder   Chronic pain of right upper extremity 11/10/2017   Coronary artery disease 2019   Stent x2   CTS (carpal tunnel syndrome) 01/27/2013   right   Dextroscoliosis of thoracic spine 10/11/2021   Diabetes mellitus type II    Diabetic gastroparesis (HCC)    Dysrhythmia    A. Fib   Esophageal reflux 08/25/2013   [place], Dr Norvell   FATTY LIVER DISEASE 04/27/2007   Qualifier: Diagnosis of  By: Anice CMA, Darlene     Focal muscle atrophy 09/26/2015   Fundic gland polyps of stomach, benign    Globus sensation 04/13/2018   Hordeolum externum of right upper eyelid 12/24/2021   HTN (hypertension)    Hx of adenomatous colonic polyps 12/10/2021   Hyperlipidemia, mixed 05/04/2007   Qualifier: Diagnosis of  By: Georgian ROSALEA CHARM Lamar crestor  caused myalgias even at low dose Livalo  caused myalgias Lipitor  Mother with severe reaction myalgias Simvastatin, Welchol     Hypokalemia 05/25/2013   Improved stopping HCTZ. Was noted to have an elevated glucose  when K was low. Recheck renal next week after starting Maxzide   Hypomagnesemia 08/10/2014   Hypothyroidism    Iron  deficiency anemia 08/07/2018   Iron  malabsorption 08/07/2018   Laryngitis 08/25/2017   Leg cramps 04/15/2016   Nausea without vomiting 08/08/2014   Neurosensory deficit 10/26/2021   NONSPEC ELEVATION OF LEVELS OF TRANSAMINASE/LDH 05/01/2007   Qualifier: Diagnosis of  By: Eyvonne Jacob     Osteoarthritis    chronic, right knee (11/04/2017)   Plantar fasciitis    Pneumonia    Rosacea 10/22/2016   Urine incontinence    Past Surgical History:  Procedure Laterality Date   ANTERIOR CERVICAL DECOMP/DISCECTOMY FUSION N/A 11/25/2022   Procedure: ANTERIOR CERVICAL DISCECTOMY AND FUSION CERVICAL FIVE TO SEVEN;  Surgeon: Burnetta Aures, MD;  Location: [facility] OR;  Service: Orthopedics;  Laterality: N/A;   AUGMENTATION MAMMAPLASTY Bilateral 2002   BLADDER SUSPENSION  1981   CATARACT EXTRACTION W/ INTRAOCULAR LENS IMPLANT Bilateral    COLONOSCOPY     CORONARY PRESSURE/FFR STUDY N/A 11/04/2017   Procedure: INTRAVASCULAR PRESSURE WIRE/FFR STUDY;  Surgeon: Mady Bruckner, MD;  Location: [facility] INVASIVE CV LAB;  Service: Cardiovascular;  Laterality: N/A;   CORONARY STENT INTERVENTION N/A 11/04/2017   Procedure: CORONARY STENT INTERVENTION;  Surgeon: Mady Bruckner, MD;  Location: [facility] INVASIVE CV [facility];  Service: Cardiovascular;  Laterality: N/A;   CORONARY ULTRASOUND/IVUS N/A 11/04/2017   Procedure: Intravascular Ultrasound/IVUS;  Surgeon: Mady Bruckner, MD;  Location: [facility];  Service: Cardiovascular;  Laterality: N/A;   ENDOVENOUS ABLATION SAPHENOUS VEIN W/ LASER Left 12/29/2019   endovenous laser ablation left greater saphenous vein by Carlin Haddock MD    ESOPHAGOGASTRODUODENOSCOPY     KNEE ARTHROSCOPY Right 1998, [id number]   LAPAROSCOPIC CHOLECYSTECTOMY  2007   LEFT HEART CATH AND CORONARY ANGIOGRAPHY N/A 11/04/2017   Procedure: LEFT HEART CATH AND CORONARY ANGIOGRAPHY;   Surgeon: Mady Bruckner, MD;  Location: [facility] IN[facility];  Service: Cardiovascular;  Laterality: N/A;   LEFT HEART CATHETERIZATION WITH CORONARY ANGIOGRAM N/A 11/21/2011   Procedure: LEFT HEART CATHETERIZATION WITH CORONARY ANGIOGRAM;  Surgeon: Maude JAYSON Emmer, MD;  Location: [facility] CATH LAB;  Service: Cardiovascular;  Laterality: N/A;   TONSILLECTOMY  1975   VAGINAL HYSTERECTOMY  1983   still have my ovaries   Family History  Problem Relation Age of Onset   Alzheimer's disease Mother    Diabetes  type II Mother    Hiatal hernia Mother    Diabetes Mother    Emphysema Father    COPD Father    Depression Sister        suicide   Diabetes Sister    Stroke Maternal Grandfather    Diabetes Daughter    Colon cancer Neg Hx    Stomach cancer Neg Hx    Esophageal cancer Neg Hx    Rectal cancer Neg Hx    Seizures Neg Hx    Allergies as of 06/22/2024       Reactions   Epinephrine  Other (See Comments)   Pass out    Other    Other reaction(s): Myalgia   Statins Other (See Comments)   Myalgias: Simvastatin also caused back ache   Welchol  [colesevelam  Hcl]    myalgia   Amoxicillin Itching, Rash   Has patient had a PCN reaction causing immediate rash, facial/tongue/throat swelling, SOB or lightheadedness with hypotension: No Has patient had a PCN reaction causing severe rash involving mucus membranes or skin necrosis: No Has patient had a PCN reaction that required hospitalization: No Has patient had a PCN reaction occurring within the last 10 years: Yes If all of the above answers are NO, then may proceed with Cephalosporin use.        Medication List        Accurate as of June 22, 2024 11:11 AM. If you have any questions, ask your nurse or doctor.          benzonatate  200 MG capsule Commonly known as: TESSALON  Take 1 capsule (200 mg total) by mouth 2 (two) times daily as needed for cough. Started by: Charlies Bellini, DO   CALTRATE 600 PO Take 650 mg by mouth daily.    cholecalciferol 25 MCG (1000 UNIT) tablet Commonly known as: VITAMIN D3 Take 1,000 Units by mouth daily.   dabigatran  150 MG Caps capsule Commonly known as: Pradaxa  Take 1 capsule (150 mg total) by mouth 2 (two) times daily.   doxycycline  100 MG tablet Commonly known as: VIBRA -TABS Take 1 tablet (100 mg total) by mouth 2 (two) times daily. Started by: Charlies Bellini, DO   ezetimibe  10 MG tablet Commonly known as: ZETIA  Take 10 mg by mouth daily.   fenofibrate  160 MG tablet TAKE 1 TABLET DAILY   fexofenadine 180 MG tablet Commonly known as: ALLEGRA Take 180 mg by mouth daily.   fluocinonide ointment 0.05 % Commonly known as: LIDEX Apply 1 Application topically as needed.   ipratropium 0.06 % nasal spray Commonly known as: ATROVENT  Place 2 sprays into both nostrils 4 (four) times daily. Started by: Charlies Bellini, DO   isosorbide  mononitrate 30 MG 24 hr tablet Commonly known as: IMDUR  Take 1 tablet (30 mg total) by mouth daily.   ketoconazole 2 % shampoo Commonly known as: NIZORAL Apply 1 Application topically 2 (two) times a week.   LEQVIO  [place] Inject into the skin.   levothyroxine  100 MCG tablet Commonly known as: SYNTHROID  Take 100 mcg by mouth daily before breakfast.   Magnesium  250 MG Tabs Take 250 mg by mouth daily.   metFORMIN  500 MG 24 hr tablet Commonly known as: GLUCOPHAGE -XR Take 500 mg by mouth 2 (two) times daily.   metoprolol  tartrate 50 MG tablet Commonly known as: LOPRESSOR  Take 1 tablet (50 mg total) by mouth 2 (two) times daily.   nitroGLYCERIN  0.4 MG SL tablet Commonly known as: NITROSTAT  Place 1 tablet (0.4 mg total) under the tongue  every 5 (five) minutes as needed for chest pain.   NovoLIN 70/30 Kwikpen (70-30) 100 UNIT/ML KwikPen Generic drug: insulin  isophane & regular human KwikPen Inject 14-25 Units into the skin See admin instructions. Take 24 mg in the morning 14 mg at lunch and 25 mg a bedtime   pantoprazole  40 MG  tablet Commonly known as: PROTONIX  Take 1 tablet (40 mg total) by mouth 2 (two) times daily before a meal.   pregabalin  50 MG capsule Commonly known as: LYRICA  Take 1 capsule (50 mg total) by mouth 2 (two) times daily.   PROBIOTIC DAILY PO Take 1 tablet by mouth daily.   Refresh Optive Mega-3 0.5-1-0.5 % Soln Generic drug: Carboxymeth-Glyc-Polysorb PF Place 1 drop into both eyes 3 (three) times daily.   triamcinolone  ointment 0.1 % Commonly known as: KENALOG  Apply 1 Application topically as needed.   valsartan  80 MG tablet Commonly known as: DIOVAN  Take 1 tablet (80 mg total) by mouth daily.   Vitamin B12 1000 MCG Tbcr 1 tablet Orally Once a day; Duration: 30 day(s)        All past medical history, surgical history, allergies, family history, immunizations andmedications were updated in the EMR today and reviewed under the history and medication portions of their EMR.     Review of Systems  Constitutional:  Positive for malaise/fatigue. Negative for chills and fever.  HENT:  Positive for congestion. Negative for ear pain, sinus pain and sore throat.   Eyes:  Positive for discharge and redness.  Respiratory:  Positive for cough. Negative for sputum production, shortness of breath and wheezing.   Gastrointestinal:  Negative for abdominal pain, diarrhea, nausea and vomiting.  Musculoskeletal:  Negative for myalgias.  Neurological:  Negative for dizziness and headaches.   Negative, with the exception of above mentioned in HPI   Objective:  BP 122/74   Pulse 72   Temp 98 F (36.7 C)   Wt 175 lb 9.6 oz (79.7 kg)   SpO2 97%   BMI 29.91 kg/m  Body mass index is 29.91 kg/m. Physical Exam Vitals and nursing note reviewed.  Constitutional:      General: She is not in acute distress.    Appearance: Normal appearance. She is not ill-appearing, toxic-appearing or diaphoretic.  HENT:     Head: Normocephalic and atraumatic.     Right Ear: Tympanic membrane, ear canal and  external ear normal.     Left Ear: Tympanic membrane, ear canal and external ear normal.     Nose: Congestion and rhinorrhea present.     Right Turbinates: Swollen.     Left Turbinates: Swollen.     Right Sinus: No maxillary sinus tenderness or frontal sinus tenderness.     Left Sinus: No maxillary sinus tenderness or frontal sinus tenderness.     Mouth/Throat:     Mouth: Mucous membranes are moist.     Tongue: No lesions.     Palate: No mass.     Pharynx: Posterior oropharyngeal erythema (Mild) and postnasal drip present. No oropharyngeal exudate.  Eyes:     General: Lids are normal. No allergic shiner, visual field deficit or scleral icterus.       Right eye: No discharge.        Left eye: No discharge.     Extraocular Movements: Extraocular movements intact.     Conjunctiva/sclera:     Right eye: Right conjunctiva is injected.     Left eye: Left conjunctiva is injected.     Pupils:  Pupils are equal, round, and reactive to light.  Cardiovascular:     Rate and Rhythm: Normal rate and regular rhythm.     Heart sounds: No murmur heard. Pulmonary:     Effort: Pulmonary effort is normal. No respiratory distress.     Breath sounds: Normal breath sounds. No wheezing, rhonchi or rales.     Comments: Dry cough present Musculoskeletal:     Cervical back: Neck supple.     Right lower leg: No edema.     Left lower leg: No edema.  Lymphadenopathy:     Cervical: No cervical adenopathy.  Skin:    General: Skin is warm.     Findings: No rash.  Neurological:     Mental Status: She is alert and oriented to person, place, and time. Mental status is at baseline.     Motor: No weakness.     Gait: Gait normal.  Psychiatric:        Mood and Affect: Mood normal.        Behavior: Behavior normal.        Thought Content: Thought content normal.        Judgment: Judgment normal.    No results found. No results found. No results found. However, due to the size of the patient record, not all  encounters were searched. Please check Results Review for a complete set of results.  Assessment/Plan: [name] [name] is a 69 y.o. female present for OV for  Acute cough/Acute bacterial sinusitis (Primary) Rest, hydrate.  Restart flonase- added benefit to help with itchy eyes.  Doxy bid prescribed, take until completed.  Tessalon  perles prescribed for cough Atrovent  nasal spray prescribed IM depo medrol  80 injection provided.  F/U 2 weeks of not improved.    Reviewed expectations re: course of current medical issues. Discussed self-management of symptoms. Outlined signs and symptoms indicating need for more acute intervention. Patient verbalized understanding and all questions were answered. Patient received an After-Visit Summary.    No orders of the defined types were placed in this encounter.  Meds ordered this encounter  Medications   doxycycline  (VIBRA -TABS) 100 MG tablet    Sig: Take 1 tablet (100 mg total) by mouth 2 (two) times daily.    Dispense:  20 tablet    Refill:  0   benzonatate  (TESSALON ) 200 MG capsule    Sig: Take 1 capsule (200 mg total) by mouth 2 (two) times daily as needed for cough.    Dispense:  20 capsule    Refill:  0   ipratropium (ATROVENT ) 0.06 % nasal spray    Sig: Place 2 sprays into both nostrils 4 (four) times daily.    Dispense:  15 mL    Refill:  2   Referral Orders  No referral(s) requested today     Note is dictated utilizing voice recognition software. Although note has been proof read prior to signing, occasional typographical errors still can be missed. If any questions arise, please do not hesitate to call for verification.   electronically signed by:  Charlies Bellini, DO  [facility] - [place]       [1]  Allergies Allergen Reactions   Epinephrine  Other (See Comments)    Pass out    Other     Other reaction(s): Myalgia   Statins Other (See Comments)    Myalgias: Simvastatin also caused back ache   Welchol   [Colesevelam  Hcl]     myalgia   Amoxicillin Itching and Rash  Has patient had a PCN reaction causing immediate rash, facial/tongue/throat swelling, SOB or lightheadedness with hypotension: No Has patient had a PCN reaction causing severe rash involving mucus membranes or skin necrosis: No Has patient had a PCN reaction that required hospitalization: No Has patient had a PCN reaction occurring within the last 10 years: Yes If all of the above answers are NO, then may proceed with Cephalosporin use.    "

## 2024-06-24 ENCOUNTER — Ambulatory Visit (HOSPITAL_COMMUNITY): Payer: Self-pay | Admitting: Physician Assistant

## 2024-06-24 LAB — BASIC METABOLIC PANEL WITH GFR
BUN/Creatinine Ratio: 16 (ref 12–28)
BUN: 16 mg/dL (ref 8–27)
CO2: 22 mmol/L (ref 20–29)
Calcium: 9.5 mg/dL (ref 8.7–10.3)
Chloride: 102 mmol/L (ref 96–106)
Creatinine, Ser: 1.02 mg/dL — ABNORMAL HIGH (ref 0.57–1.00)
Glucose: 190 mg/dL — ABNORMAL HIGH (ref 70–99)
Potassium: 4.7 mmol/L (ref 3.5–5.2)
Sodium: 142 mmol/L (ref 134–144)
eGFR: 60 mL/min/{1.73_m2}

## 2024-06-25 ENCOUNTER — Other Ambulatory Visit: Payer: Self-pay | Admitting: Family Medicine

## 2024-06-28 ENCOUNTER — Other Ambulatory Visit: Payer: Self-pay

## 2024-06-28 MED ORDER — METOPROLOL TARTRATE 50 MG PO TABS: 50.0000 mg | ORAL_TABLET | Freq: Two times a day (BID) | ORAL | 3 refills | Status: AC

## 2024-06-29 ENCOUNTER — Encounter: Payer: Self-pay | Admitting: Gastroenterology

## 2024-06-29 ENCOUNTER — Ambulatory Visit: Admitting: Gastroenterology

## 2024-06-29 ENCOUNTER — Other Ambulatory Visit (HOSPITAL_COMMUNITY): Payer: Self-pay | Admitting: *Deleted

## 2024-06-29 VITALS — BP 124/70 | HR 70 | Ht 64.25 in | Wt 180.0 lb

## 2024-06-29 DIAGNOSIS — Z860101 Personal history of adenomatous and serrated colon polyps: Secondary | ICD-10-CM | POA: Diagnosis not present

## 2024-06-29 DIAGNOSIS — K219 Gastro-esophageal reflux disease without esophagitis: Secondary | ICD-10-CM

## 2024-06-29 MED ORDER — PANTOPRAZOLE SODIUM 40 MG PO TBEC: 40.0000 mg | DELAYED_RELEASE_TABLET | Freq: Two times a day (BID) | ORAL | 1 refills | Status: AC

## 2024-06-29 MED ORDER — VALSARTAN 80 MG PO TABS: 80.0000 mg | ORAL_TABLET | Freq: Every day | ORAL | 3 refills | Status: AC

## 2024-06-29 NOTE — Patient Instructions (Signed)
 We have sent the following medications to your pharmacy for you to pick up at your convenience: Pantoprazole  40 mg twice daily  Try decreasing pantoprazole  to once daily dosing. You can alternate between 2 tablets and 1 tablet for a week. Alternatively can go to once daily dosing and use famotidine  (Pepcid ) 20 mg at night for breakthrough symptoms  _______________________________________________________  If your blood pressure at your visit was 140/90 or greater, please contact your primary care physician to follow up on this.  _______________________________________________________  If you are age 70 or older, your body mass index should be between 23-30. Your Body mass index is 30.66 kg/m. If this is out of the aforementioned range listed, please consider follow up with your Primary Care Provider.  If you are age 62 or younger, your body mass index should be between 19-25. Your Body mass index is 30.66 kg/m. If this is out of the aformentioned range listed, please consider follow up with your Primary Care Provider.   ________________________________________________________  The [facility] providers would like to encourage you to use MYCHART to communicate with providers for non-urgent requests or questions.  Due to long hold times on the telephone, sending your provider a message by [facility] may be a faster and more efficient way to get a response.  Please allow 48 business hours for a response.  Please remember that this is for non-urgent requests.  _______________________________________________________  [facility] is using a team-based approach to care.  Your team is made up of your doctor and two to three APPS. Our APPS (Nurse Practitioners and Physician Assistants) work with your physician to ensure care continuity for you. They are fully qualified to address your health concerns and develop a treatment plan. They communicate directly with your gastroenterologist to care for  you. Seeing the Advanced Practice Practitioners on your physician's team can help you by facilitating care more promptly, often allowing for earlier appointments, access to diagnostic testing, procedures, and other specialty referrals.

## 2024-07-28 ENCOUNTER — Ambulatory Visit: Admitting: Family Medicine

## 2024-08-23 ENCOUNTER — Ambulatory Visit: Admitting: Adult Health

## 2024-09-06 ENCOUNTER — Ambulatory Visit: Admitting: Neurology

## 2024-09-10 ENCOUNTER — Ambulatory Visit

## 2025-06-08 ENCOUNTER — Ambulatory Visit
